# Patient Record
Sex: Male | Born: 1935 | Race: White | Hispanic: No | Marital: Married | Smoking: Never smoker
Health system: Southern US, Community
[De-identification: ages and names within clinical notes are randomized; demographics above are authoritative.]

## PROBLEM LIST (undated history)

## (undated) DIAGNOSIS — N200 Calculus of kidney: Secondary | ICD-10-CM

## (undated) DIAGNOSIS — E119 Type 2 diabetes mellitus without complications: Secondary | ICD-10-CM

## (undated) DIAGNOSIS — Q602 Renal agenesis, unspecified: Secondary | ICD-10-CM

## (undated) DIAGNOSIS — E785 Hyperlipidemia, unspecified: Secondary | ICD-10-CM

## (undated) DIAGNOSIS — D693 Immune thrombocytopenic purpura: Secondary | ICD-10-CM

## (undated) DIAGNOSIS — E039 Hypothyroidism, unspecified: Secondary | ICD-10-CM

## (undated) DIAGNOSIS — N390 Urinary tract infection, site not specified: Secondary | ICD-10-CM

## (undated) DIAGNOSIS — M1990 Unspecified osteoarthritis, unspecified site: Secondary | ICD-10-CM

## (undated) DIAGNOSIS — Z992 Dependence on renal dialysis: Secondary | ICD-10-CM

## (undated) DIAGNOSIS — N135 Crossing vessel and stricture of ureter without hydronephrosis: Secondary | ICD-10-CM

## (undated) DIAGNOSIS — I10 Essential (primary) hypertension: Secondary | ICD-10-CM

## (undated) HISTORY — DX: Renal agenesis, unspecified: Q60.2

## (undated) HISTORY — DX: Hyperlipidemia, unspecified: E78.5

## (undated) HISTORY — DX: Type 2 diabetes mellitus without complications: E11.9

## (undated) HISTORY — DX: Essential (primary) hypertension: I10

## (undated) HISTORY — PX: APPENDECTOMY: SHX54

## (undated) HISTORY — PX: KIDNEY STONE SURGERY: SHX686

## (undated) HISTORY — DX: Hypothyroidism, unspecified: E03.9

---

## 1983-06-17 HISTORY — PX: NASAL SINUS SURGERY: SHX719

## 2000-06-16 HISTORY — PX: FINGER SURGERY: SHX640

## 2004-07-30 ENCOUNTER — Ambulatory Visit: Payer: Self-pay | Admitting: Specialist

## 2006-06-15 ENCOUNTER — Ambulatory Visit: Payer: Self-pay | Admitting: Gastroenterology

## 2006-12-16 ENCOUNTER — Ambulatory Visit: Payer: Self-pay | Admitting: Internal Medicine

## 2006-12-28 ENCOUNTER — Ambulatory Visit: Payer: Self-pay | Admitting: Internal Medicine

## 2010-03-07 ENCOUNTER — Ambulatory Visit: Payer: Self-pay | Admitting: Gastroenterology

## 2010-03-11 LAB — PATHOLOGY REPORT

## 2010-12-13 ENCOUNTER — Ambulatory Visit: Payer: Self-pay | Admitting: General Surgery

## 2011-01-14 ENCOUNTER — Ambulatory Visit: Payer: Self-pay | Admitting: Specialist

## 2011-03-11 ENCOUNTER — Ambulatory Visit: Payer: Self-pay | Admitting: Urology

## 2011-03-31 DIAGNOSIS — I10 Essential (primary) hypertension: Secondary | ICD-10-CM | POA: Insufficient documentation

## 2011-03-31 DIAGNOSIS — E039 Hypothyroidism, unspecified: Secondary | ICD-10-CM | POA: Insufficient documentation

## 2011-03-31 DIAGNOSIS — N184 Chronic kidney disease, stage 4 (severe): Secondary | ICD-10-CM | POA: Insufficient documentation

## 2011-05-21 ENCOUNTER — Ambulatory Visit: Payer: Self-pay | Admitting: Urology

## 2011-05-28 ENCOUNTER — Ambulatory Visit: Payer: Self-pay | Admitting: Urology

## 2012-03-12 DIAGNOSIS — R351 Nocturia: Secondary | ICD-10-CM | POA: Insufficient documentation

## 2012-03-12 DIAGNOSIS — Q602 Renal agenesis, unspecified: Secondary | ICD-10-CM | POA: Insufficient documentation

## 2012-06-16 HISTORY — PX: CATARACT EXTRACTION W/ INTRAOCULAR LENS IMPLANT: SHX1309

## 2012-10-15 ENCOUNTER — Emergency Department: Payer: Self-pay | Admitting: Emergency Medicine

## 2012-10-15 LAB — COMPREHENSIVE METABOLIC PANEL
Albumin: 4.2 g/dL (ref 3.4–5.0)
Anion Gap: 11 (ref 7–16)
BUN: 51 mg/dL — ABNORMAL HIGH (ref 7–18)
Bilirubin,Total: 0.8 mg/dL (ref 0.2–1.0)
Calcium, Total: 8.7 mg/dL (ref 8.5–10.1)
EGFR (Non-African Amer.): 17 — ABNORMAL LOW
Osmolality: 290 (ref 275–301)
Potassium: 4.2 mmol/L (ref 3.5–5.1)
SGPT (ALT): 46 U/L (ref 12–78)
Sodium: 137 mmol/L (ref 136–145)
Total Protein: 8.7 g/dL — ABNORMAL HIGH (ref 6.4–8.2)

## 2012-10-15 LAB — CBC
HCT: 39.8 % — ABNORMAL LOW (ref 40.0–52.0)
MCHC: 34.5 g/dL (ref 32.0–36.0)
MCV: 95 fL (ref 80–100)
Platelet: 179 10*3/uL (ref 150–440)
RBC: 4.19 10*6/uL — ABNORMAL LOW (ref 4.40–5.90)
RDW: 14.2 % (ref 11.5–14.5)

## 2012-10-15 LAB — URINALYSIS, COMPLETE
Glucose,UR: NEGATIVE mg/dL (ref 0–75)
Ketone: NEGATIVE
Specific Gravity: 1.014 (ref 1.003–1.030)
Squamous Epithelial: 1

## 2012-11-06 LAB — URINE CULTURE

## 2013-02-21 DIAGNOSIS — R3915 Urgency of urination: Secondary | ICD-10-CM | POA: Insufficient documentation

## 2014-04-28 DIAGNOSIS — A498 Other bacterial infections of unspecified site: Secondary | ICD-10-CM | POA: Insufficient documentation

## 2014-04-28 DIAGNOSIS — Q600 Renal agenesis, unilateral: Secondary | ICD-10-CM | POA: Insufficient documentation

## 2015-04-24 ENCOUNTER — Other Ambulatory Visit: Payer: Self-pay | Admitting: Internal Medicine

## 2015-04-24 ENCOUNTER — Ambulatory Visit
Admission: RE | Admit: 2015-04-24 | Discharge: 2015-04-24 | Disposition: A | Discharge status: Home / Self Care | Payer: Medicare HMO | Source: Ambulatory Visit | Attending: Internal Medicine | Admitting: Internal Medicine

## 2015-04-24 DIAGNOSIS — M79606 Pain in leg, unspecified: Secondary | ICD-10-CM

## 2015-04-24 DIAGNOSIS — M8580 Other specified disorders of bone density and structure, unspecified site: Secondary | ICD-10-CM | POA: Insufficient documentation

## 2015-04-24 DIAGNOSIS — M47814 Spondylosis without myelopathy or radiculopathy, thoracic region: Secondary | ICD-10-CM | POA: Diagnosis not present

## 2015-04-24 DIAGNOSIS — I700 Atherosclerosis of aorta: Secondary | ICD-10-CM | POA: Insufficient documentation

## 2015-04-24 DIAGNOSIS — M5134 Other intervertebral disc degeneration, thoracic region: Secondary | ICD-10-CM | POA: Diagnosis not present

## 2015-07-24 ENCOUNTER — Inpatient Hospital Stay
Admission: AD | Admit: 2015-07-24 | Discharge: 2015-07-29 | DRG: 813 | Disposition: A | Discharge status: Home / Self Care | Payer: Medicare HMO | Source: Ambulatory Visit | Attending: Internal Medicine | Admitting: Internal Medicine

## 2015-07-24 ENCOUNTER — Encounter: Payer: Self-pay | Admitting: Oncology

## 2015-07-24 ENCOUNTER — Inpatient Hospital Stay: Payer: Medicare HMO | Attending: Oncology | Admitting: Oncology

## 2015-07-24 VITALS — BP 142/76 | HR 68 | Temp 97.1°F | Resp 16 | Wt 181.0 lb

## 2015-07-24 DIAGNOSIS — E538 Deficiency of other specified B group vitamins: Secondary | ICD-10-CM | POA: Insufficient documentation

## 2015-07-24 DIAGNOSIS — Z794 Long term (current) use of insulin: Secondary | ICD-10-CM

## 2015-07-24 DIAGNOSIS — R5383 Other fatigue: Secondary | ICD-10-CM | POA: Insufficient documentation

## 2015-07-24 DIAGNOSIS — D638 Anemia in other chronic diseases classified elsewhere: Secondary | ICD-10-CM | POA: Diagnosis present

## 2015-07-24 DIAGNOSIS — Z79899 Other long term (current) drug therapy: Secondary | ICD-10-CM

## 2015-07-24 DIAGNOSIS — D649 Anemia, unspecified: Secondary | ICD-10-CM

## 2015-07-24 DIAGNOSIS — E1122 Type 2 diabetes mellitus with diabetic chronic kidney disease: Secondary | ICD-10-CM | POA: Insufficient documentation

## 2015-07-24 DIAGNOSIS — Q602 Renal agenesis, unspecified: Secondary | ICD-10-CM

## 2015-07-24 DIAGNOSIS — E039 Hypothyroidism, unspecified: Secondary | ICD-10-CM | POA: Diagnosis present

## 2015-07-24 DIAGNOSIS — I517 Cardiomegaly: Secondary | ICD-10-CM | POA: Insufficient documentation

## 2015-07-24 DIAGNOSIS — I129 Hypertensive chronic kidney disease with stage 1 through stage 4 chronic kidney disease, or unspecified chronic kidney disease: Secondary | ICD-10-CM

## 2015-07-24 DIAGNOSIS — Z7952 Long term (current) use of systemic steroids: Secondary | ICD-10-CM | POA: Insufficient documentation

## 2015-07-24 DIAGNOSIS — E1165 Type 2 diabetes mellitus with hyperglycemia: Secondary | ICD-10-CM | POA: Diagnosis present

## 2015-07-24 DIAGNOSIS — D693 Immune thrombocytopenic purpura: Secondary | ICD-10-CM | POA: Insufficient documentation

## 2015-07-24 DIAGNOSIS — T380X5A Adverse effect of glucocorticoids and synthetic analogues, initial encounter: Secondary | ICD-10-CM | POA: Diagnosis not present

## 2015-07-24 DIAGNOSIS — N189 Chronic kidney disease, unspecified: Secondary | ICD-10-CM | POA: Diagnosis not present

## 2015-07-24 DIAGNOSIS — E785 Hyperlipidemia, unspecified: Secondary | ICD-10-CM | POA: Insufficient documentation

## 2015-07-24 DIAGNOSIS — M549 Dorsalgia, unspecified: Secondary | ICD-10-CM | POA: Diagnosis present

## 2015-07-24 DIAGNOSIS — Z87440 Personal history of urinary (tract) infections: Secondary | ICD-10-CM | POA: Insufficient documentation

## 2015-07-24 DIAGNOSIS — D696 Thrombocytopenia, unspecified: Secondary | ICD-10-CM | POA: Diagnosis present

## 2015-07-24 DIAGNOSIS — I2510 Atherosclerotic heart disease of native coronary artery without angina pectoris: Secondary | ICD-10-CM | POA: Insufficient documentation

## 2015-07-24 DIAGNOSIS — N184 Chronic kidney disease, stage 4 (severe): Secondary | ICD-10-CM | POA: Diagnosis present

## 2015-07-24 DIAGNOSIS — J9811 Atelectasis: Secondary | ICD-10-CM | POA: Insufficient documentation

## 2015-07-24 DIAGNOSIS — N281 Cyst of kidney, acquired: Secondary | ICD-10-CM | POA: Insufficient documentation

## 2015-07-24 DIAGNOSIS — K449 Diaphragmatic hernia without obstruction or gangrene: Secondary | ICD-10-CM | POA: Insufficient documentation

## 2015-07-24 DIAGNOSIS — R531 Weakness: Secondary | ICD-10-CM | POA: Insufficient documentation

## 2015-07-24 LAB — CBC
HEMATOCRIT: 31.1 % — AB (ref 40.0–52.0)
Hemoglobin: 10.8 g/dL — ABNORMAL LOW (ref 13.0–18.0)
MCH: 32.8 pg (ref 26.0–34.0)
MCHC: 34.6 g/dL (ref 32.0–36.0)
MCV: 95 fL (ref 80.0–100.0)
PLATELETS: 5 10*3/uL — AB (ref 150–440)
RBC: 3.27 MIL/uL — ABNORMAL LOW (ref 4.40–5.90)
RDW: 14.9 % — AB (ref 11.5–14.5)
WBC: 6.2 10*3/uL (ref 3.8–10.6)

## 2015-07-24 LAB — DAT, POLYSPECIFIC AHG (ARMC ONLY): POLYSPECIFIC AHG TEST: NEGATIVE

## 2015-07-24 LAB — FERRITIN: Ferritin: 219 ng/mL (ref 24–336)

## 2015-07-24 LAB — COMPREHENSIVE METABOLIC PANEL
ALK PHOS: 148 U/L — AB (ref 38–126)
ALT: 19 U/L (ref 17–63)
AST: 22 U/L (ref 15–41)
Albumin: 3.8 g/dL (ref 3.5–5.0)
Anion gap: 7 (ref 5–15)
BILIRUBIN TOTAL: 0.7 mg/dL (ref 0.3–1.2)
BUN: 47 mg/dL — AB (ref 6–20)
CALCIUM: 8.8 mg/dL — AB (ref 8.9–10.3)
CO2: 18 mmol/L — ABNORMAL LOW (ref 22–32)
Chloride: 110 mmol/L (ref 101–111)
Creatinine, Ser: 3.32 mg/dL — ABNORMAL HIGH (ref 0.61–1.24)
GFR calc Af Amer: 18 mL/min — ABNORMAL LOW (ref >60)
GFR, EST NON AFRICAN AMERICAN: 16 mL/min — AB (ref >60)
Glucose, Bld: 287 mg/dL — ABNORMAL HIGH (ref 65–99)
POTASSIUM: 4.9 mmol/L (ref 3.5–5.1)
Sodium: 135 mmol/L (ref 135–145)
TOTAL PROTEIN: 7.5 g/dL (ref 6.5–8.1)

## 2015-07-24 LAB — TYPE AND SCREEN
ABO/RH(D): O POS
ANTIBODY SCREEN: NEGATIVE

## 2015-07-24 LAB — APTT: APTT: 28 s (ref 24–36)

## 2015-07-24 LAB — IRON AND TIBC
Iron: 63 ug/dL (ref 45–182)
SATURATION RATIOS: 20 % (ref 17.9–39.5)
TIBC: 323 ug/dL (ref 250–450)
UIBC: 260 ug/dL

## 2015-07-24 LAB — PROTIME-INR
INR: 1.01
PROTHROMBIN TIME: 13.5 s (ref 11.4–15.0)

## 2015-07-24 LAB — FIBRINOGEN: FIBRINOGEN: 480 mg/dL — AB (ref 210–470)

## 2015-07-24 LAB — LACTATE DEHYDROGENASE: LDH: 249 U/L — ABNORMAL HIGH (ref 98–192)

## 2015-07-24 LAB — TSH: TSH: 4.375 u[IU]/mL (ref 0.350–4.500)

## 2015-07-24 LAB — FOLATE: Folate: 28 ng/mL (ref >5.9)

## 2015-07-24 LAB — FIBRIN DERIVATIVES D-DIMER (ARMC ONLY): FIBRIN DERIVATIVES D-DIMER (ARMC): 5760 — AB (ref 0–499)

## 2015-07-24 MED ORDER — ONDANSETRON HCL 4 MG PO TABS: 4.0000 mg | ORAL_TABLET | Freq: Four times a day (QID) | ORAL | Status: DC | PRN

## 2015-07-24 MED ORDER — FERROUS SULFATE 325 (65 FE) MG PO TABS
325.0000 mg | ORAL_TABLET | Freq: Every day | ORAL | Status: DC
  Administered 2015-07-26 – 2015-07-29 (×4): 325 mg via ORAL
  Filled 2015-07-24 (×4): qty 1

## 2015-07-24 MED ORDER — SODIUM BICARBONATE 650 MG PO TABS
1300.0000 mg | ORAL_TABLET | Freq: Two times a day (BID) | ORAL | Status: DC
  Administered 2015-07-24 – 2015-07-29 (×10): 1300 mg via ORAL
  Filled 2015-07-24 (×10): qty 2

## 2015-07-24 MED ORDER — NIACIN 500 MG PO TABS
500.0000 mg | ORAL_TABLET | Freq: Every day | ORAL | Status: DC
  Administered 2015-07-24 – 2015-07-28 (×5): 500 mg via ORAL
  Filled 2015-07-24 (×8): qty 1

## 2015-07-24 MED ORDER — LEVOTHYROXINE SODIUM 125 MCG PO TABS
125.0000 ug | ORAL_TABLET | Freq: Every day | ORAL | Status: DC
  Administered 2015-07-25 – 2015-07-29 (×5): 125 ug via ORAL
  Filled 2015-07-24 (×6): qty 1

## 2015-07-24 MED ORDER — ACETAMINOPHEN 650 MG RE SUPP: 650.0000 mg | Freq: Four times a day (QID) | RECTAL | Status: DC | PRN

## 2015-07-24 MED ORDER — VITAMIN D 1000 UNITS PO TABS
1000.0000 [IU] | ORAL_TABLET | Freq: Every day | ORAL | Status: DC
  Administered 2015-07-24 – 2015-07-29 (×5): 1000 [IU] via ORAL
  Filled 2015-07-24 (×5): qty 1

## 2015-07-24 MED ORDER — LISINOPRIL 20 MG PO TABS
20.0000 mg | ORAL_TABLET | Freq: Every day | ORAL | Status: DC
  Administered 2015-07-24 – 2015-07-29 (×5): 20 mg via ORAL
  Filled 2015-07-24 (×5): qty 1

## 2015-07-24 MED ORDER — AMLODIPINE BESYLATE 5 MG PO TABS
5.0000 mg | ORAL_TABLET | Freq: Every day | ORAL | Status: DC
  Administered 2015-07-24 – 2015-07-29 (×5): 5 mg via ORAL
  Filled 2015-07-24 (×5): qty 1

## 2015-07-24 MED ORDER — ACETAMINOPHEN 325 MG PO TABS: 650.0000 mg | ORAL_TABLET | Freq: Four times a day (QID) | ORAL | Status: DC | PRN

## 2015-07-24 MED ORDER — ATORVASTATIN CALCIUM 10 MG PO TABS
10.0000 mg | ORAL_TABLET | Freq: Every day | ORAL | Status: DC
  Administered 2015-07-24 – 2015-07-28 (×5): 10 mg via ORAL
  Filled 2015-07-24 (×5): qty 1

## 2015-07-24 MED ORDER — OXYCODONE HCL 5 MG PO TABS: 5.0000 mg | ORAL_TABLET | ORAL | Status: DC | PRN

## 2015-07-24 MED ORDER — OXYBUTYNIN CHLORIDE ER 5 MG PO TB24
5.0000 mg | ORAL_TABLET | Freq: Every day | ORAL | Status: DC
  Administered 2015-07-24 – 2015-07-28 (×5): 5 mg via ORAL
  Filled 2015-07-24 (×6): qty 1

## 2015-07-24 MED ORDER — SODIUM CHLORIDE 0.9 % IV SOLN
Freq: Once | INTRAVENOUS | Status: AC
  Administered 2015-07-24: 18:00:00 via INTRAVENOUS

## 2015-07-24 MED ORDER — MORPHINE SULFATE (PF) 2 MG/ML IV SOLN: 2.0000 mg | INTRAVENOUS | Status: DC | PRN

## 2015-07-24 MED ORDER — ONDANSETRON HCL 4 MG/2ML IJ SOLN: 4.0000 mg | Freq: Four times a day (QID) | INTRAMUSCULAR | Status: DC | PRN

## 2015-07-24 NOTE — Consult Note (Signed)
[place]  [phone] [phone]  ID: [name] OB: August 14, 1929  MR#: [id number]  DGL#:[id number]  Patient Care Team: Casilda Carls, MD as PCP - General (Internal Medicine)  CHIEF COMPLAINT: thrombocytopenia, easy bleeding or bruising.  INTERVAL HISTORY: Patient is an 82 year old male who was initially evaluated as a new patient consult in clinic and found to have a platelet count of 7. He complained of easy bleeding and bruising over the past 3-4 weeks. He is otherwise well and asymptomatic. Patient states prior to this he was in his usual state of health without any fevers or illnesses. He has back pain, but this is chronic in nature. He denies any weight loss. He has no night sweats. He has no neurologic complaints. He denies any chest pain, shortness of breath, cough, or hemoptysis. He denies any nausea, vomiting, cons patient, or diarrhea. He has no urinary complaints. Patient otherwise feels well and offers no further specific complaints.  REVIEW OF SYSTEMS:   Review of Systems  Constitutional: Negative for fever, weight loss, malaise/fatigue and diaphoresis.  Respiratory: Negative.  Negative for cough, hemoptysis and shortness of breath.   Cardiovascular: Negative.  Negative for chest pain.  Gastrointestinal: Negative.   Genitourinary: Negative.  Negative for hematuria.  Musculoskeletal: Positive for back pain.  Neurological: Negative.  Negative for weakness.  Endo/Heme/Allergies: Does not bruise/bleed easily.    As per HPI. Otherwise, a complete review of systems is negatve.  PAST MEDICAL HISTORY: Past Medical History  Diagnosis Date  . Hyperlipidemia   . Hypertension   . Hypothyroidism   . Type 2 diabetes mellitus ([place])   . Renal agenesis     discovered at age 68    PAST SURGICAL HISTORY: History reviewed. No pertinent past surgical history.  FAMILY HISTORY History reviewed. No pertinent family  history.     ADVANCED DIRECTIVES:    HEALTH MAINTENANCE: Social History  Substance Use Topics  . Smoking status: Never Smoker   . Smokeless tobacco: Never Used  . Alcohol Use: No     Colonoscopy:  PAP:  Bone density:  Lipid panel:  No Known Allergies  Current Facility-Administered Medications  Medication Dose Route Frequency Provider Last Rate Last Dose  . 0.9 %  sodium chloride infusion   Intravenous Once Lytle Butte, MD      . acetaminophen (TYLENOL) tablet 650 mg  650 mg Oral Q6H PRN Lytle Butte, MD       Or  . acetaminophen (TYLENOL) suppository 650 mg  650 mg Rectal Q6H PRN Lytle Butte, MD      . amLODipine (NORVASC) tablet 5 mg  5 mg Oral Daily Lytle Butte, MD   5 mg at 07/24/15 1412  . atorvastatin (LIPITOR) tablet 10 mg  10 mg Oral q1800 Lytle Butte, MD      . cholecalciferol (VITAMIN D) tablet 1,000 Units  1,000 Units Oral Daily Lytle Butte, MD   1,000 Units at 07/24/15 1412  . [START ON 07/25/2015] ferrous sulfate tablet 325 mg  325 mg Oral Q breakfast Lytle Butte, MD      . Derrill Memo ON 07/25/2015] levothyroxine (SYNTHROID, LEVOTHROID) tablet 125 mcg  125 mcg Oral QAC breakfast Lytle Butte, MD      . lisinopril (PRINIVIL,ZESTRIL) tablet 20 mg  20 mg Oral Daily Lytle Butte, MD   20 mg at 07/24/15 1412  . morphine 2 MG/ML injection 2 mg  2 mg Intravenous Q4H PRN Shanon Brow  K Hower, MD      . niacin tablet 500 mg  500 mg Oral QHS Lytle Butte, MD      . ondansetron [facility]) tablet 4 mg  4 mg Oral Q6H PRN Lytle Butte, MD       Or  . ondansetron [facility]) injection 4 mg  4 mg Intravenous Q6H PRN Lytle Butte, MD      . oxybutynin (DITROPAN-XL) 24 hr tablet 5 mg  5 mg Oral QHS Lytle Butte, MD      . oxyCODONE (Oxy IR/ROXICODONE) immediate release tablet 5 mg  5 mg Oral Q4H PRN Lytle Butte, MD      . sodium bicarbonate tablet 1,300 mg  1,300 mg Oral BID Lytle Butte, MD   1,300 mg at 07/24/15 1412    OBJECTIVE: Filed Vitals:   07/24/15 1222  BP: 158/82   Pulse: 76  Temp: 98.7 F (37.1 C)  Resp: 20     Body mass index is 27.68 kg/(m^2).    ECOG FS:0 - Asymptomatic  General: Well-developed, well-nourished, no acute distress. Eyes: Pink conjunctiva, anicteric sclera. HEENT: Normocephalic, moist mucous membranes, clear oropharnyx. Lungs: Clear to auscultation bilaterally. Heart: Regular rate and rhythm. No rubs, murmurs, or gallops. Abdomen: Soft, nontender, nondistended. No organomegaly noted, normoactive bowel sounds. Musculoskeletal: No edema, cyanosis, or clubbing. Neuro: Alert, answering all questions appropriately. Cranial nerves grossly intact. Skin: ecchymosis noted on all extremities. Psych: Normal affect. Lymphatics: No cervical, calvicular, axillary or inguinal LAD.   LAB RESULTS:  Lab Results  Component Value Date   NA 135 07/24/2015   K 4.9 07/24/2015   CL 110 07/24/2015   CO2 18* 07/24/2015   GLUCOSE 287* 07/24/2015   BUN 47* 07/24/2015   CREATININE 3.32* 07/24/2015   CALCIUM 8.8* 07/24/2015   PROT 7.5 07/24/2015   ALBUMIN 3.8 07/24/2015   AST 22 07/24/2015   ALT 19 07/24/2015   ALKPHOS 148* 07/24/2015   BILITOT 0.7 07/24/2015   GFRNONAA 16* 07/24/2015   GFRAA 18* 07/24/2015    Lab Results  Component Value Date   WBC 6.2 07/24/2015   HGB 10.8* 07/24/2015   HCT 31.1* 07/24/2015   MCV 95.0 07/24/2015   PLT 5* 07/24/2015     STUDIES: No results found.  ASSESSMENT: Severe thrombocytopenia with bleeding and bruising.  PLAN:    1. Thrombocytopenia: Unclear etiology. He denies any new medications or recent illnesses.  Patient had a normal platelet count in November 2016. He is otherwise asymptomatic. Patient has elevated creatinine, but but this is approximately his baseline. There is no suspicion of TTP. Have ordered laboratory work including DIC panel, iron stores, B 12, folate, and platelet antibodies. Will also schedule bone marrow biopsy for tomorrow morning to determine a definitive etiology.   Continue to transfuse platelets and maintain platelet count greater than 20,000. Recommend irradiated blood products until an etiology can be determined. 2. Chronic renal insufficiency: By report, Creatinine is approximately patient's baseline. Monitor. 3. Anemia: Bone marrow biopsy and labs as above. Will get haptoglobin, LDH, and Coombs to assess if there is any underlying hemolysis.  Appreciate consult, will follow.   [name], MD   07/24/2015 4:48 PM

## 2015-07-24 NOTE — Progress Notes (Signed)
   07/24/15 1330  Clinical Encounter Type  Visited With Patient and family together  Visit Type Initial;Spiritual support  Referral From Nurse  Consult/Referral To Chaplain  Spiritual Encounters  Spiritual Needs Prayer  Stress Factors  Patient Stress Factors Health changes  Family Stress Factors Major life changes  Met w/patient & family. Provided pastoral care & prayer. Chap. Jaicion Laurie G. Alba

## 2015-07-24 NOTE — H&P (Signed)
[place] [facility] [place] NAME: Alejandro Armstrong    MR#:  [id number]  DATE OF BIRTH:  1929-08-12   DATE OF ADMISSION:  07/24/2015  PRIMARY CARE PHYSICIAN: Casilda Carls, MD   REQUESTING/REFERRING PHYSICIAN: [facility]  CHIEF COMPLAINT:  Bruising  HISTORY OF PRESENT ILLNESS:  Alejandro Armstrong  is a 82 y.o. male with a known history of chronic kidney disease baseline creatinine around 3, thyroidism unspecified presenting as a direct admission to the hospital given thrombocytopenia. Patient states for prostate 3 weeks is been having unexplained bruising saw his PCP for similar symptoms had routine blood work drawn noted to have low platelet count referred to oncology. Patient also states about a week or 2 ago he had an injection in his eye and then noted to have corneal hemorrhage. Given platelet count of 7, active bruising, patient directed to hospital for admission and platelet transfusion.  PAST MEDICAL HISTORY:   Past Medical History  Diagnosis Date  . Hyperlipidemia   . Hypertension   . Hypothyroidism   . Type 2 diabetes mellitus ([place])   . Renal agenesis     discovered at age 35    PAST SURGICAL HISTORY:  History reviewed. No pertinent past surgical history.  SOCIAL HISTORY:   Social History  Substance Use Topics  . Smoking status: Never Smoker   . Smokeless tobacco: Never Used  . Alcohol Use: No    FAMILY HISTORY:  History reviewed. No pertinent family history.  DRUG ALLERGIES:  No Known Allergies  REVIEW OF SYSTEMS:  REVIEW OF SYSTEMS:  CONSTITUTIONAL: Denies fevers, chills, fatigue, weakness.  EYES: Denies blurred vision, double vision, or eye pain.  EARS, NOSE, THROAT: Denies tinnitus, ear pain, hearing loss.  RESPIRATORY: denies cough, shortness of breath, wheezing  CARDIOVASCULAR: Denies chest pain, palpitations, edema.  GASTROINTESTINAL: Denies nausea, vomiting, diarrhea, abdominal pain.   GENITOURINARY: Denies dysuria, hematuria.  ENDOCRINE: Denies nocturia or thyroid problems. HEMATOLOGIC AND LYMPHATIC: Positive easy bruising or bleeding.  SKIN: Denies rash or lesions. Positive dry itchy skin MUSCULOSKELETAL: Denies pain in neck, back, shoulder, knees, hips, or further arthritic symptoms.  NEUROLOGIC: Denies paralysis, paresthesias.  PSYCHIATRIC: Denies anxiety or depressive symptoms. Otherwise full review of systems performed by me is negative.   MEDICATIONS AT HOME:   Prior to Admission medications   Medication Sig Start Date End Date Taking? Authorizing Provider  amLODipine (NORVASC) 5 MG tablet Take 5 mg by mouth. 07/26/13   Historical Provider, MD  cholecalciferol (VITAMIN D) 1000 units tablet Take 1,000 Units by mouth daily.    Historical Provider, MD  ferrous sulfate 325 (65 FE) MG tablet Take 325 mg by mouth.    Historical Provider, MD  levothyroxine (SYNTHROID, LEVOTHROID) 125 MCG tablet  08/07/14   Historical Provider, MD  lisinopril (PRINIVIL,ZESTRIL) 20 MG tablet Take 20 mg by mouth. 06/24/12   Historical Provider, MD  niacin 500 MG tablet Take 500 mg by mouth. 06/24/12   Historical Provider, MD  Omega-3 1000 MG CAPS Take by mouth.    Historical Provider, MD  sodium bicarbonate 650 MG tablet  08/07/14   Historical Provider, MD      VITAL SIGNS:  Blood pressure 158/82, pulse 76, temperature 98.7 F (37.1 C), temperature source Oral, resp. rate 20, height 5\' 7"  (1.702 m), weight 176 lb 12.8 oz (80.196 kg), SpO2 100 %.  PHYSICAL EXAMINATION:  VITAL SIGNS: Filed Vitals:   07/24/15 1222  BP: 158/82  Pulse: 76  Temp: 98.7 F (37.1 C)  Resp: 20   GENERAL:82 y.o.male currently in no acute distress.  HEAD: Normocephalic, atraumatic.  EYES: Pupils equal, round, reactive to light. Extraocular muscles intact. No scleral icterus.  MOUTH: Moist mucosal membrane. Dentition intact. No abscess noted.  EAR, NOSE, THROAT: Clear without exudates. No external lesions.   NECK: Supple. No thyromegaly. No nodules. No JVD.  PULMONARY: Clear to ascultation, without wheeze rails or rhonci. No use of accessory muscles, Good respiratory effort. good air entry bilaterally CHEST: Nontender to palpation.  CARDIOVASCULAR: S1 and S2. Regular rate and rhythm. No murmurs, rubs, or gallops. No edema. Pedal pulses 2+ bilaterally.  GASTROINTESTINAL: Soft, nontender, nondistended. No masses. Positive bowel sounds. No hepatosplenomegaly.  MUSCULOSKELETAL: No swelling, clubbing, or edema. Range of motion full in all extremities.  NEUROLOGIC: Cranial nerves II through XII are intact. No gross focal neurological deficits. Sensation intact. Reflexes intact.  SKIN: Multiple areas of ecchymosis, upper extremity and lower extremity abdomen and groin, skin appears dry scaly Skin warm and dry. Turgor intact.  PSYCHIATRIC: Mood, affect within normal limits. The patient is awake, alert and oriented x 3. Insight, judgment intact.    LABORATORY PANEL:   CBC No results for input(s): WBC, HGB, HCT, PLT in the last 168 hours. ------------------------------------------------------------------------------------------------------------------  Chemistries  No results for input(s): NA, K, CL, CO2, GLUCOSE, BUN, CREATININE, CALCIUM, MG, AST, ALT, ALKPHOS, BILITOT in the last 168 hours.  Invalid input(s): GFRCGP ------------------------------------------------------------------------------------------------------------------  Cardiac Enzymes No results for input(s): TROPONINI in the last 168 hours. ------------------------------------------------------------------------------------------------------------------  RADIOLOGY:  No results found.  EKG:   Orders placed or performed in visit on 05/21/11  . EKG 12-Lead    IMPRESSION AND PLAN:   82 year old Caucasian gentleman history of essential hypertension, hypothyroidism unspecified presenting with thrombocytopenia  1. Thrombocytopenia  with bruising: Repeat labs, type cross, transfuse platelets and goal greater than 50, consult oncology for further workup avoid antiplatelet, anticoagulant 2. Essential hypertension: Norvasc, lisinopril 3. Hypothyroidism unspecified: Synthroid, check TSH 4. Chronic kidney disease baseline creatinine around 3 continue sodium bicarbonate 5. Venous thromboembolic embolism prophylactic: SCDs given thrombocytopenia    All the records are reviewed and case discussed with ED provider. Management plans discussed with the patient, family and they are in agreement.  CODE STATUS: Full  TOTAL TIME TAKING CARE OF THIS PATIENT: 40 minutes.    Exilda Wilhite,  Karenann Cai.D on 07/24/2015 at 1:09 PM  Between 7am to 6pm - Pager - [phone]  After 6pm: House Pager: - [place]  Office  [phone]  CC: Primary care physician; Casilda Carls, MD

## 2015-07-24 NOTE — Progress Notes (Signed)
PCP would like patient to have evaluation for easy bruising, bleeding, frequent nose bleed that started 2-3 weeks ago.  Went to eye doctor yesterday and diagnosed with hemorrhage in left retina.

## 2015-07-25 ENCOUNTER — Inpatient Hospital Stay: Payer: Medicare HMO

## 2015-07-25 ENCOUNTER — Other Ambulatory Visit: Payer: Self-pay | Admitting: *Deleted

## 2015-07-25 ENCOUNTER — Encounter: Payer: Self-pay | Admitting: Radiology

## 2015-07-25 LAB — CBC WITH DIFFERENTIAL/PLATELET
Basophils Absolute: 0.1 10*3/uL (ref 0–0.1)
Basophils Relative: 1 %
Eosinophils Absolute: 0.7 10*3/uL (ref 0–0.7)
HEMATOCRIT: 27.4 % — AB (ref 40.0–52.0)
HEMOGLOBIN: 9.5 g/dL — AB (ref 13.0–18.0)
Lymphocytes Relative: 23 %
Lymphs Abs: 1.6 10*3/uL (ref 1.0–3.6)
MCH: 32.5 pg (ref 26.0–34.0)
MCHC: 34.7 g/dL (ref 32.0–36.0)
MCV: 93.6 fL (ref 80.0–100.0)
Monocytes Absolute: 0.6 10*3/uL (ref 0.2–1.0)
Neutro Abs: 4 10*3/uL (ref 1.4–6.5)
Platelets: 37 10*3/uL — ABNORMAL LOW (ref 150–440)
RBC: 2.93 MIL/uL — AB (ref 4.40–5.90)
RDW: 14.4 % (ref 11.5–14.5)
WBC: 6.9 10*3/uL (ref 3.8–10.6)

## 2015-07-25 LAB — GLUCOSE, CAPILLARY
Glucose-Capillary: 160 mg/dL — ABNORMAL HIGH (ref 65–99)
Glucose-Capillary: 250 mg/dL — ABNORMAL HIGH (ref 65–99)
Glucose-Capillary: 137 mg/dL — ABNORMAL HIGH (ref 65–99)

## 2015-07-25 LAB — VITAMIN B12: VITAMIN B 12: 111 pg/mL — AB (ref 180–914)

## 2015-07-25 LAB — PREPARE PLATELET PHERESIS
UNIT DIVISION: 0
Unit division: 0

## 2015-07-25 LAB — ABO/RH: ABO/RH(D): O POS

## 2015-07-25 LAB — HAPTOGLOBIN: Haptoglobin: 63 mg/dL (ref 34–200)

## 2015-07-25 LAB — FIBRIN DEGRADATION PROD.(ARMC ONLY)

## 2015-07-25 MED ORDER — AMLODIPINE BESYLATE 5 MG PO TABS
5.0000 mg | ORAL_TABLET | Freq: Once | ORAL | Status: AC
  Administered 2015-07-25: 15:00:00 5 mg via ORAL
  Filled 2015-07-25: qty 1

## 2015-07-25 MED ORDER — MIDAZOLAM HCL 5 MG/5ML IJ SOLN
INTRAMUSCULAR | Status: AC
  Filled 2015-07-25: qty 5

## 2015-07-25 MED ORDER — INSULIN ASPART 100 UNIT/ML ~~LOC~~ SOLN
0.0000 [IU] | Freq: Every day | SUBCUTANEOUS | Status: DC
  Administered 2015-07-26: 2 [IU] via SUBCUTANEOUS
  Administered 2015-07-27: 4 [IU] via SUBCUTANEOUS
  Filled 2015-07-25: qty 4
  Filled 2015-07-25: qty 2

## 2015-07-25 MED ORDER — SODIUM CHLORIDE 0.9 % IV SOLN
INTRAVENOUS | Status: AC | PRN
  Administered 2015-07-25: 10 mL/h via INTRAVENOUS

## 2015-07-25 MED ORDER — LORAZEPAM 2 MG/ML IJ SOLN
INTRAMUSCULAR | Status: AC | PRN
  Administered 2015-07-25: 14:00:00 1 mg via INTRAVENOUS

## 2015-07-25 MED ORDER — FENTANYL CITRATE (PF) 100 MCG/2ML IJ SOLN
INTRAMUSCULAR | Status: AC
  Filled 2015-07-25: qty 4

## 2015-07-25 MED ORDER — INSULIN ASPART 100 UNIT/ML ~~LOC~~ SOLN
0.0000 [IU] | Freq: Three times a day (TID) | SUBCUTANEOUS | Status: DC
  Administered 2015-07-25: 3 [IU] via SUBCUTANEOUS
  Administered 2015-07-26: 2 [IU] via SUBCUTANEOUS
  Administered 2015-07-26: 17:00:00 1 [IU] via SUBCUTANEOUS
  Administered 2015-07-27: 3 [IU] via SUBCUTANEOUS
  Administered 2015-07-27: 17:00:00 7 [IU] via SUBCUTANEOUS
  Administered 2015-07-28: 08:00:00 2 [IU] via SUBCUTANEOUS
  Administered 2015-07-28: 12:00:00 7 [IU] via SUBCUTANEOUS
  Filled 2015-07-25: qty 7
  Filled 2015-07-25: qty 3
  Filled 2015-07-25 (×2): qty 2
  Filled 2015-07-25: qty 3
  Filled 2015-07-25: qty 1
  Filled 2015-07-25: qty 7

## 2015-07-25 MED ORDER — HEPARIN SOD (PORK) LOCK FLUSH 100 UNIT/ML IV SOLN
INTRAVENOUS | Status: AC
  Filled 2015-07-25: qty 5

## 2015-07-25 NOTE — Progress Notes (Signed)
Patient's platelet count improved to 37,000 today with transfusion. The remainder of his laboratory work does not indicate a distinct etiology. Bone marrow biopsy completed today, but likely will not have results until early next week. If patient's platelet count remains stable tomorrow without transfusion, okay to discharge with follow-up in the [place] next week.  Will follow

## 2015-07-25 NOTE — Progress Notes (Signed)
[place] at [place] NAME: Alejandro Armstrong    MR#:  [id number]  DATE OF BIRTH:  [birth year]  SUBJECTIVE:  CHIEF COMPLAINT:  No chief complaint on file. No complaint.  REVIEW OF SYSTEMS:  CONSTITUTIONAL: No fever, fatigue or weakness.  EYES: No blurred or double vision.  EARS, NOSE, AND THROAT: No tinnitus or ear pain.  RESPIRATORY: No cough, shortness of breath, wheezing or hemoptysis.  CARDIOVASCULAR: No chest pain, orthopnea, edema.  GASTROINTESTINAL: No nausea, vomiting, diarrhea or abdominal pain.  GENITOURINARY: No dysuria, hematuria.  ENDOCRINE: No polyuria, nocturia,  HEMATOLOGY: No anemia, easy bruising or bleeding SKIN: No rash or lesion. MUSCULOSKELETAL: No joint pain or arthritis.   NEUROLOGIC: No tingling, numbness, weakness.  PSYCHIATRY: No anxiety or depression.   DRUG ALLERGIES:  No Known Allergies  VITALS:  Blood pressure 178/69, pulse 55, temperature 98.4 F (36.9 C), temperature source Oral, resp. rate 16, height '5\' 7"'$  (1.702 m), weight 80.196 kg (176 lb 12.8 oz), SpO2 100 %.  PHYSICAL EXAMINATION:  GENERAL:  82 y.o.-year-old patient lying in the bed with no acute distress.  EYES: Pupils equal, round, reactive to light and accommodation. No scleral icterus. Extraocular muscles intact.  HEENT: Head atraumatic, normocephalic. Oropharynx and nasopharynx clear.  NECK:  Supple, no jugular venous distention. No thyroid enlargement, no tenderness.  LUNGS: Normal breath sounds bilaterally, no wheezing, rales,rhonchi or crepitation. No use of accessory muscles of respiration.  CARDIOVASCULAR: S1, S2 normal. No murmurs, rubs, or gallops.  ABDOMEN: Soft, nontender, nondistended. Bowel sounds present. No organomegaly or mass.  EXTREMITIES: No pedal edema, cyanosis, or clubbing.  NEUROLOGIC: Cranial nerves II through XII are intact. Muscle strength 5/5 in all extremities. Sensation intact. Gait not checked.   PSYCHIATRIC: The patient is alert and oriented x 3.  SKIN: No obvious rash, lesion, or ulcer. Multiple areas of ecchymosis.   LABORATORY PANEL:   CBC  Recent Labs Lab 07/24/15 2316  WBC 6.9  HGB 9.5*  HCT 27.4*  PLT 37*   ------------------------------------------------------------------------------------------------------------------  Chemistries   Recent Labs Lab 07/24/15 1310  NA 135  K 4.9  CL 110  CO2 18*  GLUCOSE 287*  BUN 47*  CREATININE 3.32*  CALCIUM 8.8*  AST 22  ALT 19  ALKPHOS 148*  BILITOT 0.7   ------------------------------------------------------------------------------------------------------------------  Cardiac Enzymes No results for input(s): TROPONINI in the last 168 hours. ------------------------------------------------------------------------------------------------------------------  RADIOLOGY:  No results found.  EKG:   Orders placed or performed in visit on 05/21/11  . EKG 12-Lead    ASSESSMENT AND PLAN:   82 year old Caucasian gentleman history of essential hypertension, hypothyroidism unspecified presenting with thrombocytopenia  1. Thrombocytopenia with bruising: S/p transfusion of platelets, up to 37, bone marrow biopsy today. Continue to transfuse platelets and maintain platelet count greater than 20,000. Recommend irradiated blood products until an etiology can be determined per Dr. Grayland Ormond.  2. Essential hypertension: Norvasc, lisinopril 3. Hypothyroidism unspecified: Synthroid. 4. Chronic kidney disease stage 4.  baseline creatinine around 3, stable, continue sodium bicarbonate 5. Venous thromboembolic embolism prophylactic: SCDs given thrombocytopenia     All the records are reviewed and case discussed with Care Management/Social Workerr. Management plans discussed with the patient, his daughter and a sister and they are in agreement.  CODE STATUS: Full code  TOTAL TIME TAKING CARE OF THIS PATIENT: 38  minutes.  Greater than 50% time was spent on coordination of care and face-to-face counseling.  POSSIBLE D/C IN 2-3 DAYS, DEPENDING ON CLINICAL  CONDITION.   Demetrios Loll M.D on 07/25/2015 at 3:02 PM  Between 7am to 6pm - Pager - [phone]  After 6pm go to [email or web address] - password [email or web address] [facility]  Office  [phone]  CC: Primary care physician; Casilda Carls, MD

## 2015-07-25 NOTE — Progress Notes (Signed)
Spoke with Dr. Bridgett Larsson to make him aware of pt's current BP and get order for diet.  New orders given.  Also made Dr. Bridgett Larsson aware that pt's was in a junctional rhythm while in specials for biopsy.  New order for cardiac monitor given and placed on pt.  Clarise Cruz, RN

## 2015-07-25 NOTE — Progress Notes (Signed)
Inpatient Diabetes Program Recommendations  AACE/ADA: New Consensus Statement on Inpatient Glycemic Control (2015)  Target Ranges:  Prepandial:   less than 140 mg/dL      Peak postprandial:   less than 180 mg/dL (1-2 hours)      Critically ill patients:  140 - 180 mg/dL   Review of Glycemic Control:  Results for QUINNTEN, BITER (MRN [id number]) as of 07/25/2015 09:39  Ref. Range 07/24/2015 13:10  Glucose Latest Ref Range: 65-99 mg/dL 287 (H)    Diabetes history: Type 2 diabetes Outpatient Diabetes medications: None listed Inpatient Diabetes Program Recommendations:  Note history of diabetes.  Please consider adding Novolog correction sensitive tid with meals and HS while in the hospital.  Thanks, Adah Perl, RN, BC-ADM Inpatient Diabetes Coordinator Pager [phone] (8a-5p)

## 2015-07-25 NOTE — Procedures (Signed)
Interventional Radiology Procedure Note  Procedure: CT guided aspirate and core biopsy of left posterior iliac bone Complications: None Recommendations: - Bedrest supine x 3 hrs - OTC's for PRN  Pain - Follow biopsy results  Signed,  Dulcy Fanny. Earleen Newport, DO

## 2015-07-25 NOTE — Plan of Care (Signed)
Problem: Pain Managment: Goal: General experience of comfort will improve Outcome: Progressing No complaints of pain. Received 2 units of platelets this shift, no adverse reaction noted. Ambulates with assist.

## 2015-07-26 DIAGNOSIS — E538 Deficiency of other specified B group vitamins: Secondary | ICD-10-CM

## 2015-07-26 LAB — CBC
HCT: 28.7 % — ABNORMAL LOW (ref 40.0–52.0)
HEMOGLOBIN: 9.9 g/dL — AB (ref 13.0–18.0)
MCH: 32.5 pg (ref 26.0–34.0)
MCHC: 34.4 g/dL (ref 32.0–36.0)
MCV: 94.4 fL (ref 80.0–100.0)
Platelets: <5 10*3/uL — CL (ref 150–440)
RBC: 3.04 MIL/uL — AB (ref 4.40–5.90)
RDW: 14.8 % — ABNORMAL HIGH (ref 11.5–14.5)
WBC: 5.1 10*3/uL (ref 3.8–10.6)

## 2015-07-26 LAB — GLUCOSE, CAPILLARY
GLUCOSE-CAPILLARY: 110 mg/dL — AB (ref 65–99)
Glucose-Capillary: 176 mg/dL — ABNORMAL HIGH (ref 65–99)
Glucose-Capillary: 148 mg/dL — ABNORMAL HIGH (ref 65–99)
Glucose-Capillary: 209 mg/dL — ABNORMAL HIGH (ref 65–99)

## 2015-07-26 MED ORDER — DIPHENHYDRAMINE HCL 25 MG PO CAPS
25.0000 mg | ORAL_CAPSULE | Freq: Every evening | ORAL | Status: DC | PRN
  Administered 2015-07-26 – 2015-07-27 (×2): 25 mg via ORAL
  Filled 2015-07-26 (×2): qty 1

## 2015-07-26 MED ORDER — CYANOCOBALAMIN 1000 MCG/ML IJ SOLN
1000.0000 ug | Freq: Once | INTRAMUSCULAR | Status: AC
  Administered 2015-07-26: 19:00:00 1000 ug via INTRAMUSCULAR
  Filled 2015-07-26: qty 1

## 2015-07-26 MED ORDER — SODIUM CHLORIDE 0.9 % IV SOLN
Freq: Once | INTRAVENOUS | Status: AC
  Administered 2015-07-26: 17:00:00 via INTRAVENOUS

## 2015-07-26 MED ORDER — PREDNISONE 50 MG PO TABS
80.0000 mg | ORAL_TABLET | Freq: Every day | ORAL | Status: DC
  Administered 2015-07-26 – 2015-07-29 (×4): 80 mg via ORAL
  Filled 2015-07-26 (×4): qty 1

## 2015-07-26 NOTE — Progress Notes (Signed)
[place]  [phone] [phone]  ID: [name] OB: [birth year]  MR#: [id number]  DUK#:[id number]  Patient Care Team: Casilda Carls, MD as PCP - General (Internal Medicine)  CHIEF COMPLAINT: Thrombocytopenia.  INTERVAL HISTORY: Patient's platelet count initially improved to 37 yesterday, but now have decreased down to 4. He continues to feel well and remains asymptomatic. He does not complain of any further bleeding. Patient feels at his baseline and offers no specific complaints today.  REVIEW OF SYSTEMS:   Review of Systems  Constitutional: Negative.   Cardiovascular: Negative.   Genitourinary: Negative.   Musculoskeletal: Negative.   Neurological: Negative.   Endo/Heme/Allergies: Bruises/bleeds easily.    As per HPI. Otherwise, a complete review of systems is negatve.  PAST MEDICAL HISTORY: Past Medical History  Diagnosis Date  . Hyperlipidemia   . Hypertension   . Hypothyroidism   . Type 2 diabetes mellitus ([place])   . Renal agenesis     discovered at age 52    PAST SURGICAL HISTORY: History reviewed. No pertinent past surgical history.  FAMILY HISTORY History reviewed. No pertinent family history.     ADVANCED DIRECTIVES:    HEALTH MAINTENANCE: Social History  Substance Use Topics  . Smoking status: Never Smoker   . Smokeless tobacco: Never Used  . Alcohol Use: No     Colonoscopy:  PAP:  Bone density:  Lipid panel:  No Known Allergies  Current Facility-Administered Medications  Medication Dose Route Frequency Provider Last Rate Last Dose  . 0.9 %  sodium chloride infusion   Intravenous Once Demetrios Loll, MD      . acetaminophen (TYLENOL) tablet 650 mg  650 mg Oral Q6H PRN Lytle Butte, MD       Or  . acetaminophen (TYLENOL) suppository 650 mg  650 mg Rectal Q6H PRN Lytle Butte, MD      . amLODipine (NORVASC) tablet 5 mg  5 mg Oral Daily Lytle Butte, MD   5 mg at 07/26/15 1013  . atorvastatin  (LIPITOR) tablet 10 mg  10 mg Oral q1800 Lytle Butte, MD   10 mg at 07/25/15 1736  . cholecalciferol (VITAMIN D) tablet 1,000 Units  1,000 Units Oral Daily Lytle Butte, MD   1,000 Units at 07/26/15 1013  . ferrous sulfate tablet 325 mg  325 mg Oral Q breakfast Lytle Butte, MD   325 mg at 07/26/15 1019  . insulin aspart (novoLOG) injection 0-5 Units  0-5 Units Subcutaneous QHS Demetrios Loll, MD   0 Units at 07/25/15 2210  . insulin aspart (novoLOG) injection 0-9 Units  0-9 Units Subcutaneous TID WC Demetrios Loll, MD   2 Units at 07/26/15 1210  . levothyroxine (SYNTHROID, LEVOTHROID) tablet 125 mcg  125 mcg Oral QAC breakfast Lytle Butte, MD   125 mcg at 07/26/15 0506  . lisinopril (PRINIVIL,ZESTRIL) tablet 20 mg  20 mg Oral Daily Lytle Butte, MD   20 mg at 07/26/15 1013  . morphine 2 MG/ML injection 2 mg  2 mg Intravenous Q4H PRN Lytle Butte, MD      . niacin tablet 500 mg  500 mg Oral QHS Lytle Butte, MD   500 mg at 07/25/15 2211  . ondansetron (ZOFRAN) tablet 4 mg  4 mg Oral Q6H PRN Lytle Butte, MD       Or  . ondansetron [facility]) injection 4 mg  4 mg Intravenous Q6H PRN Lytle Butte, MD      .  oxybutynin (DITROPAN-XL) 24 hr tablet 5 mg  5 mg Oral QHS Lytle Butte, MD   5 mg at 07/25/15 2210  . oxyCODONE (Oxy IR/ROXICODONE) immediate release tablet 5 mg  5 mg Oral Q4H PRN Lytle Butte, MD      . predniSONE (DELTASONE) tablet 80 mg  80 mg Oral Daily [name], MD      . sodium bicarbonate tablet 1,300 mg  1,300 mg Oral BID Lytle Butte, MD   1,300 mg at 07/26/15 1013    OBJECTIVE: Filed Vitals:   07/26/15 1530 07/26/15 1606  BP: 136/82 134/74  Pulse: 64 68  Temp: 98.6 F (37 C) 98.7 F (37.1 C)  Resp: 18 18     Body mass index is 27.68 kg/(m^2).    ECOG FS:0 - Asymptomatic  General: Well-developed, well-nourished, no acute distress. Eyes: Pink conjunctiva, anicteric sclera. Lungs: Clear to auscultation bilaterally. Heart: Regular rate and rhythm. No rubs, murmurs,  or gallops. Abdomen: Soft, nontender, nondistended. No organomegaly noted, normoactive bowel sounds. Musculoskeletal: No edema, cyanosis, or clubbing. Neuro: Alert, answering all questions appropriately. Cranial nerves grossly intact. Skin: No rashes. Multiple ecchymosis noted. Psych: Normal affect.    LAB RESULTS:  Lab Results  Component Value Date   NA 135 07/24/2015   K 4.9 07/24/2015   CL 110 07/24/2015   CO2 18* 07/24/2015   GLUCOSE 287* 07/24/2015   BUN 47* 07/24/2015   CREATININE 3.32* 07/24/2015   CALCIUM 8.8* 07/24/2015   PROT 7.5 07/24/2015   ALBUMIN 3.8 07/24/2015   AST 22 07/24/2015   ALT 19 07/24/2015   ALKPHOS 148* 07/24/2015   BILITOT 0.7 07/24/2015   GFRNONAA 16* 07/24/2015   GFRAA 18* 07/24/2015    Lab Results  Component Value Date   WBC 5.1 07/26/2015   NEUTROABS 4.0 07/24/2015   HGB 9.9* 07/26/2015   HCT 28.7* 07/26/2015   MCV 94.4 07/26/2015   PLT <5* 07/26/2015     STUDIES: Ct Biopsy  07/25/2015  CLINICAL DATA:  82 year old male with thrombocytopenia EXAM: CT-GUIDED BIOPSY BONE MARROW MEDICATIONS AND MEDICAL HISTORY: Versed 1.0 mg, Fentanyl 0 mcg. Additional Medications: None. ANESTHESIA/SEDATION: None PROCEDURE: The procedure risks, benefits, and alternatives were explained to the patient. Questions regarding the procedure were encouraged and answered. The patient understands and consents to the procedure. Scout CT of the pelvis was performed for surgical planning purposes. The posterior pelvis was prepped with Betadinein a sterile fashion, and a sterile drape was applied covering the operative field. A sterile gown and sterile gloves were used for the procedure. Local anesthesia was provided with 1% Lidocaine. We targeted the left posterior iliac bone for biopsy. The skin and subcutaneous tissues were infiltrated with 1% lidocaine without epinephrine. A small stab incision was made with an 11 blade scalpel, and an 11 gauge Murphy needle was advanced  with CT guidance to the posterior cortex. Manual forced was used to advance the needle through the posterior cortex and the stylet was removed. A bone marrow aspirate was retrieved and passed to a cytotechnologist in the room. The Murphy needle was then advanced without the stylet for a core biopsy. The core biopsy was retrieved and also passed to a cytotechnologist. Manual pressure was used for hemostasis and a sterile dressing was placed. No complications were encountered no significant blood loss was encountered. Patient tolerated the procedure well and remained hemodynamically stable throughout. FINDINGS: Scout image demonstrates safe approach to posterior iliac bone. Images during the case demonstrate placement  of 11 gauge Murphy needle COMPLICATIONS: None IMPRESSION: Status post CT-guided bone marrow biopsy, with tissue specimen sent to pathology for complete histopathologic analysis Signed, Dulcy Fanny. Earleen Newport, DO [facility] Radiology Electronically Signed   By: Corrie Mckusick D.O.   On: 07/25/2015 17:08    ASSESSMENT: Severe thrombocytopenia with bleeding and bruising.  PLAN:    1. Thrombocytopenia: Unclear etiology. Laboratory work unrevealing. Bone marrow biopsy completed yesterday is pending. Will proceed with 80 mg of prednisone daily or 1 mg/kg. EBV, CMV, and parvo B19 has been ordered and are pending. Continue to transfuse with irradiated platelets maintaining count greater than 20,000.  Patient has elevated creatinine, but but this is approximately his baseline. There is no suspicion of TTP.  2. Chronic renal insufficiency: By report, Creatinine is approximately patient's baseline. Monitor. 3. Anemia: Bone marrow biopsy and labs as above. No evidence of hemolysis.   4. B-12 deficiency: Patient's B-12 levels noted to be low therefore proceed with 1000 g intramuscular B 12.  Will follow.   [name], MD   07/26/2015 4:36 PM

## 2015-07-26 NOTE — Care Management Important Message (Signed)
Important Message  Patient Details  Name: Alejandro Armstrong MRN: [id number] Date of Birth: [birth year]   Medicare Important Message Given:  Yes    [name] 07/26/2015, 10:02 AM

## 2015-07-26 NOTE — Plan of Care (Signed)
RN called to confirm with Dr. Cecille Aver that he DOES want pt to receive irradiated platelets.  Blood [facility]) contacted to inform.

## 2015-07-26 NOTE — Plan of Care (Signed)
Dr. Curly Rim of critical platelets of 4. No further orders at this time.

## 2015-07-26 NOTE — Progress Notes (Signed)
[place] at [place] NAME: Torence Palmeri    MR#:  [id number]  DATE OF BIRTH:  [birth year]  SUBJECTIVE:  CHIEF COMPLAINT:  No chief complaint on file. No complaint.  REVIEW OF SYSTEMS:  CONSTITUTIONAL: No fever, fatigue or weakness.  EYES: No blurred or double vision.  EARS, NOSE, AND THROAT: No tinnitus or ear pain.  RESPIRATORY: No cough, shortness of breath, wheezing or hemoptysis.  CARDIOVASCULAR: No chest pain, orthopnea, edema.  GASTROINTESTINAL: No nausea, vomiting, diarrhea or abdominal pain. No melena or bloody stool. GENITOURINARY: No dysuria, hematuria.  ENDOCRINE: No polyuria, nocturia,  HEMATOLOGY: No anemia, easy bruising or bleeding SKIN: No rash or lesion. No new bruises. MUSCULOSKELETAL: No joint pain or arthritis.   NEUROLOGIC: No tingling, numbness, weakness.  PSYCHIATRY: No anxiety or depression.   DRUG ALLERGIES:  No Known Allergies  VITALS:  Blood pressure 150/70, pulse 62, temperature 97.7 F (36.5 C), temperature source Oral, resp. rate 17, height '5\' 7"'  (1.702 m), weight 80.196 kg (176 lb 12.8 oz), SpO2 98 %.  PHYSICAL EXAMINATION:  GENERAL:  82 y.o.-year-old patient lying in the bed with no acute distress.  EYES: Pupils equal, round, reactive to light and accommodation. No scleral icterus. Extraocular muscles intact.  HEENT: Head atraumatic, normocephalic. Oropharynx and nasopharynx clear.  NECK:  Supple, no jugular venous distention. No thyroid enlargement, no tenderness.  LUNGS: Normal breath sounds bilaterally, no wheezing, rales,rhonchi or crepitation. No use of accessory muscles of respiration.  CARDIOVASCULAR: S1, S2 normal. No murmurs, rubs, or gallops.  ABDOMEN: Soft, nontender, nondistended. Bowel sounds present. No organomegaly or mass.  EXTREMITIES: No pedal edema, cyanosis, or clubbing.  NEUROLOGIC: Cranial nerves II through XII are intact. Muscle strength 5/5 in all extremities.  Sensation intact. Gait not checked.  PSYCHIATRIC: The patient is alert and oriented x 3.  SKIN: No obvious rash, lesion, or ulcer. Multiple areas of ecchymosis.   LABORATORY PANEL:   CBC  Recent Labs Lab 07/26/15 0857  WBC 5.1  HGB 9.9*  HCT 28.7*  PLT <5*   ------------------------------------------------------------------------------------------------------------------  Chemistries   Recent Labs Lab 07/24/15 1310  NA 135  K 4.9  CL 110  CO2 18*  GLUCOSE 287*  BUN 47*  CREATININE 3.32*  CALCIUM 8.8*  AST 22  ALT 19  ALKPHOS 148*  BILITOT 0.7   ------------------------------------------------------------------------------------------------------------------  Cardiac Enzymes No results for input(s): TROPONINI in the last 168 hours. ------------------------------------------------------------------------------------------------------------------  RADIOLOGY:  Ct Biopsy  07/25/2015  CLINICAL DATA:  82 year old male with thrombocytopenia EXAM: CT-GUIDED BIOPSY BONE MARROW MEDICATIONS AND MEDICAL HISTORY: Versed 1.0 mg, Fentanyl 0 mcg. Additional Medications: None. ANESTHESIA/SEDATION: None PROCEDURE: The procedure risks, benefits, and alternatives were explained to the patient. Questions regarding the procedure were encouraged and answered. The patient understands and consents to the procedure. Scout CT of the pelvis was performed for surgical planning purposes. The posterior pelvis was prepped with Betadinein a sterile fashion, and a sterile drape was applied covering the operative field. A sterile gown and sterile gloves were used for the procedure. Local anesthesia was provided with 1% Lidocaine. We targeted the left posterior iliac bone for biopsy. The skin and subcutaneous tissues were infiltrated with 1% lidocaine without epinephrine. A small stab incision was made with an 11 blade scalpel, and an 11 gauge Murphy needle was advanced with CT guidance to the posterior  cortex. Manual forced was used to advance the needle through the posterior cortex and the stylet was removed. A bone  marrow aspirate was retrieved and passed to a cytotechnologist in the room. The Murphy needle was then advanced without the stylet for a core biopsy. The core biopsy was retrieved and also passed to a cytotechnologist. Manual pressure was used for hemostasis and a sterile dressing was placed. No complications were encountered no significant blood loss was encountered. Patient tolerated the procedure well and remained hemodynamically stable throughout. FINDINGS: Scout image demonstrates safe approach to posterior iliac bone. Images during the case demonstrate placement of 11 gauge Murphy needle COMPLICATIONS: None IMPRESSION: Status post CT-guided bone marrow biopsy, with tissue specimen sent to pathology for complete histopathologic analysis Signed, Dulcy Fanny. Earleen Newport, DO [facility] Radiology Electronically Signed   By: Corrie Mckusick D.O.   On: 07/25/2015 17:08    EKG:   Orders placed or performed in visit on 05/21/11  . EKG 12-Lead    ASSESSMENT AND PLAN:   82 year old Caucasian gentleman history of essential hypertension, hypothyroidism unspecified presenting with thrombocytopenia  1. Thrombocytopenia with bruising: S/p transfusion of platelets, up to 37, but down to <5 today, s/p bone marrow biopsy. Continue to transfuse platelets and maintain platelet count greater than 20,000. Recommend irradiated blood products until an etiology can be determined per Dr. Grayland Ormond. Follow-up CBC.  2. Essential hypertension: Norvasc, lisinopril 3. Hypothyroidism unspecified: Synthroid. 4. Chronic kidney disease stage 4.  baseline creatinine around 3, stable, continue sodium bicarbonate 5. Venous thromboembolic embolism prophylactic: SCDs given thrombocytopenia  * DM. On sliding scale.   All the records are reviewed and case discussed with Care  Management/Social Workerr. Management plans discussed with the patient, his daughter and a sister and they are in agreement.  CODE STATUS: Full code  TOTAL TIME TAKING CARE OF THIS PATIENT: 38 minutes.  Greater than 50% time was spent on coordination of care and face-to-face counseling.  POSSIBLE D/C IN 2-3 DAYS, DEPENDING ON CLINICAL CONDITION.   Demetrios Loll M.D on 07/26/2015 at 2:30 PM  Between 7am to 6pm - Pager - [phone]  After 6pm go to [email or web address] - password [email or web address] [facility]  Office  [phone]  CC: Primary care physician; Casilda Carls, MD

## 2015-07-27 LAB — PARVOVIRUS B19 ANTIBODY, IGG AND IGM
Parovirus B19 IgG Abs: 8.3 index — ABNORMAL HIGH (ref 0.0–0.8)
Parovirus B19 IgM Abs: 0.5 index (ref 0.0–0.8)

## 2015-07-27 LAB — EPSTEIN-BARR VIRUS VCA ANTIBODY PANEL
EBV EARLY ANTIGEN AB, IGG: 11.4 U/mL — AB (ref 0.0–8.9)
EBV NA IGG: 437 U/mL — AB (ref 0.0–17.9)

## 2015-07-27 LAB — PREPARE PLATELET PHERESIS
UNIT DIVISION: 0
Unit division: 0

## 2015-07-27 LAB — BASIC METABOLIC PANEL
ANION GAP: 13 (ref 5–15)
BUN: 55 mg/dL — AB (ref 6–20)
CHLORIDE: 110 mmol/L (ref 101–111)
CO2: 15 mmol/L — ABNORMAL LOW (ref 22–32)
Calcium: 9.4 mg/dL (ref 8.9–10.3)
Creatinine, Ser: 3.29 mg/dL — ABNORMAL HIGH (ref 0.61–1.24)
GFR calc Af Amer: 19 mL/min — ABNORMAL LOW (ref >60)
GFR, EST NON AFRICAN AMERICAN: 16 mL/min — AB (ref >60)
GLUCOSE: 293 mg/dL — AB (ref 65–99)
POTASSIUM: 5.1 mmol/L (ref 3.5–5.1)
Sodium: 138 mmol/L (ref 135–145)

## 2015-07-27 LAB — CBC
HEMATOCRIT: 33 % — AB (ref 40.0–52.0)
HEMOGLOBIN: 11.4 g/dL — AB (ref 13.0–18.0)
MCH: 32.3 pg (ref 26.0–34.0)
MCHC: 34.4 g/dL (ref 32.0–36.0)
MCV: 93.9 fL (ref 80.0–100.0)
Platelets: 19 10*3/uL — CL (ref 150–440)
RBC: 3.52 MIL/uL — ABNORMAL LOW (ref 4.40–5.90)
RDW: 14.4 % (ref 11.5–14.5)
WBC: 3.8 10*3/uL (ref 3.8–10.6)

## 2015-07-27 LAB — GLUCOSE, CAPILLARY
GLUCOSE-CAPILLARY: 246 mg/dL — AB (ref 65–99)
GLUCOSE-CAPILLARY: 477 mg/dL — AB (ref 65–99)
GLUCOSE-CAPILLARY: 324 mg/dL — AB (ref 65–99)
Glucose-Capillary: 328 mg/dL — ABNORMAL HIGH (ref 65–99)

## 2015-07-27 LAB — CMV IGM: CMV IgM: <30 AU/mL (ref 0.0–29.9)

## 2015-07-27 MED ORDER — SODIUM CHLORIDE 0.9 % IV SOLN
Freq: Once | INTRAVENOUS | Status: AC
  Administered 2015-07-27: 11:00:00 via INTRAVENOUS

## 2015-07-27 MED ORDER — INSULIN GLARGINE 100 UNIT/ML ~~LOC~~ SOLN
12.0000 [IU] | Freq: Every day | SUBCUTANEOUS | Status: DC
  Administered 2015-07-27: 12 [IU] via SUBCUTANEOUS
  Filled 2015-07-27 (×2): qty 0.12

## 2015-07-27 MED ORDER — INSULIN ASPART 100 UNIT/ML ~~LOC~~ SOLN
10.0000 [IU] | Freq: Once | SUBCUTANEOUS | Status: AC
Start: 2015-07-27 — End: 2015-07-27
  Administered 2015-07-27: 12:00:00 10 [IU] via SUBCUTANEOUS
  Filled 2015-07-27: qty 10

## 2015-07-27 NOTE — Progress Notes (Signed)
[place] at [place] NAME: Alejandro Armstrong    MR#:  [id number]  DATE OF BIRTH:  March 19, 1930  SUBJECTIVE:  CHIEF COMPLAINT:  No chief complaint on file. No complaint.  REVIEW OF SYSTEMS:  CONSTITUTIONAL: No fever, fatigue or weakness.  EYES: No blurred or double vision.  EARS, NOSE, AND THROAT: No tinnitus or ear pain.  RESPIRATORY: No cough, shortness of breath, wheezing or hemoptysis.  CARDIOVASCULAR: No chest pain, orthopnea, edema.  GASTROINTESTINAL: No nausea, vomiting, diarrhea or abdominal pain. No melena or bloody stool. GENITOURINARY: No dysuria, hematuria.  ENDOCRINE: No polyuria, nocturia,  HEMATOLOGY: No anemia, easy bruising or bleeding SKIN: No rash or lesion. No new bruises. MUSCULOSKELETAL: No joint pain or arthritis.   NEUROLOGIC: No tingling, numbness, weakness.  PSYCHIATRY: No anxiety or depression.   DRUG ALLERGIES:  No Known Allergies  VITALS:  Blood pressure 137/63, pulse 73, temperature 97.6 F (36.4 C), temperature source Oral, resp. rate 20, height '5\' 7"'  (1.702 m), weight 80.196 kg (176 lb 12.8 oz), SpO2 99 %.  PHYSICAL EXAMINATION:  GENERAL:  82 y.o.-year-old patient lying in the bed with no acute distress.  EYES: Pupils equal, round, reactive to light and accommodation. No scleral icterus. Extraocular muscles intact.  HEENT: Head atraumatic, normocephalic. Oropharynx and nasopharynx clear.  NECK:  Supple, no jugular venous distention. No thyroid enlargement, no tenderness.  LUNGS: Normal breath sounds bilaterally, no wheezing, rales,rhonchi or crepitation. No use of accessory muscles of respiration.  CARDIOVASCULAR: S1, S2 normal. No murmurs, rubs, or gallops.  ABDOMEN: Soft, nontender, nondistended. Bowel sounds present. No organomegaly or mass.  EXTREMITIES: No pedal edema, cyanosis, or clubbing.  NEUROLOGIC: Cranial nerves II through XII are intact. Muscle strength 5/5 in all extremities.  Sensation intact. Gait not checked.  PSYCHIATRIC: The patient is alert and oriented x 3.  SKIN: No obvious rash, lesion, or ulcer. Multiple areas of ecchymosis.   LABORATORY PANEL:   CBC  Recent Labs Lab 07/27/15 0536  WBC 3.8  HGB 11.4*  HCT 33.0*  PLT 19*   ------------------------------------------------------------------------------------------------------------------  Chemistries   Recent Labs Lab 07/24/15 1310 07/27/15 0536  NA 135 138  K 4.9 5.1  CL 110 110  CO2 18* 15*  GLUCOSE 287* 293*  BUN 47* 55*  CREATININE 3.32* 3.29*  CALCIUM 8.8* 9.4  AST 22  --   ALT 19  --   ALKPHOS 148*  --   BILITOT 0.7  --    ------------------------------------------------------------------------------------------------------------------  Cardiac Enzymes No results for input(s): TROPONINI in the last 168 hours. ------------------------------------------------------------------------------------------------------------------  RADIOLOGY:  No results found.  EKG:   Orders placed or performed in visit on 05/21/11  . EKG 12-Lead    ASSESSMENT AND PLAN:   82 year old Caucasian gentleman history of essential hypertension, hypothyroidism unspecified presenting with thrombocytopenia  1. Thrombocytopenia with bruising: S/p transfusion of platelets, up to 37, but down to <5 yesterday, got 2 more units, Plt is 19 today. Transfusing 1 units today.  s/p bone marrow biopsy. Continue to transfuse platelets and maintain platelet count greater than 20,000. Recommend irradiated blood products until an etiology can be determined per Dr. Grayland Ormond.  Started prednisone per Dr. Grayland Ormond. Follow-up CBC.  2. Essential hypertension: Norvasc, lisinopril 3. Hypothyroidism unspecified: Synthroid. 4. Chronic kidney disease stage 4.  baseline creatinine around 3, stable, continue sodium bicarbonate 5. Venous thromboembolic embolism prophylactic: SCDs given thrombocytopenia  * DM with  hyperglycemia. On sliding scale. BS is high to 477 due to  steroid, add lantus 12 uints HS   All the records are reviewed and case discussed with Care Management/Social Workerr. Management plans discussed with the patient, his wife and they are in agreement.  CODE STATUS: Full code  TOTAL TIME TAKING CARE OF THIS PATIENT: 37 minutes.  Greater than 50% time was spent on coordination of care and face-to-face counseling.  POSSIBLE D/C IN 2-3 DAYS, DEPENDING ON CLINICAL CONDITION.   Demetrios Loll M.D on 07/27/2015 at 3:13 PM  Between 7am to 6pm - Pager - [phone]  After 6pm go to [email or web address] - password [email or web address] [facility]  Office  [phone]  CC: Primary care physician; Casilda Carls, MD

## 2015-07-27 NOTE — Progress Notes (Signed)
No definitive etiology of thrombocytopenia yet. Continue prednisone 1 mg/kg as ordered. Viral workup pending. Bone marrow biopsy results also pending. Preliminary flow cytometry only revealed nonspecific monocytic findings as well as a slight relative increase in eosinophils. Continue to transfuse with irradiated blood products and maintain platelet count greater than 20,000 if patient continues to actively bleed.  Will follow.

## 2015-07-27 NOTE — Care Management (Addendum)
Admitted to [facility] with the diagnosis of thrombocytopenia. Lives with wife Izora Gala [phone]). Seen Dr. Rosario Jacks on Monday. Sees Dr. Grayland [facility]rmond at the [facility] for  Cancer related services. Last Home Health services was 3 years ago. Can't remember the name of the agency. No skilled facility. Uses no aids for ambulation. Fell/slipped about a month ago. Good appetite. Takes care of all basic and instrumental activities of daily living himself, drives. Wife will transport. Shelbie Ammons RN MSN CCM Care Management [phone]

## 2015-07-27 NOTE — Progress Notes (Signed)
This encounter was created in error - please disregard.

## 2015-07-28 DIAGNOSIS — I10 Essential (primary) hypertension: Secondary | ICD-10-CM

## 2015-07-28 DIAGNOSIS — K5900 Constipation, unspecified: Secondary | ICD-10-CM

## 2015-07-28 DIAGNOSIS — E119 Type 2 diabetes mellitus without complications: Secondary | ICD-10-CM

## 2015-07-28 DIAGNOSIS — E039 Hypothyroidism, unspecified: Secondary | ICD-10-CM

## 2015-07-28 LAB — PREPARE PLATELET PHERESIS: UNIT DIVISION: 0

## 2015-07-28 LAB — CBC
HEMATOCRIT: 27.6 % — AB (ref 40.0–52.0)
HEMOGLOBIN: 9.7 g/dL — AB (ref 13.0–18.0)
MCH: 33.2 pg (ref 26.0–34.0)
MCHC: 35 g/dL (ref 32.0–36.0)
MCV: 95 fL (ref 80.0–100.0)
Platelets: 41 10*3/uL — ABNORMAL LOW (ref 150–440)
RBC: 2.91 MIL/uL — ABNORMAL LOW (ref 4.40–5.90)
RDW: 14.5 % (ref 11.5–14.5)
WBC: 7.4 10*3/uL (ref 3.8–10.6)

## 2015-07-28 LAB — GLUCOSE, CAPILLARY
Glucose-Capillary: 196 mg/dL — ABNORMAL HIGH (ref 65–99)
Glucose-Capillary: 350 mg/dL — ABNORMAL HIGH (ref 65–99)
Glucose-Capillary: 426 mg/dL — ABNORMAL HIGH (ref 65–99)
Glucose-Capillary: 316 mg/dL — ABNORMAL HIGH (ref 65–99)

## 2015-07-28 MED ORDER — BISACODYL 5 MG PO TBEC
5.0000 mg | DELAYED_RELEASE_TABLET | Freq: Every day | ORAL | Status: DC | PRN
  Administered 2015-07-28: 5 mg via ORAL
  Filled 2015-07-28 (×2): qty 1

## 2015-07-28 MED ORDER — DOCUSATE SODIUM 100 MG PO CAPS
100.0000 mg | ORAL_CAPSULE | Freq: Two times a day (BID) | ORAL | Status: DC
  Administered 2015-07-28 – 2015-07-29 (×2): 100 mg via ORAL
  Filled 2015-07-28 (×2): qty 1

## 2015-07-28 MED ORDER — INSULIN GLARGINE 100 UNIT/ML ~~LOC~~ SOLN
17.0000 [IU] | Freq: Every day | SUBCUTANEOUS | Status: DC
Start: 2015-07-28 — End: 2015-07-29
  Administered 2015-07-28: 22:00:00 17 [IU] via SUBCUTANEOUS
  Filled 2015-07-28 (×2): qty 0.17

## 2015-07-28 MED ORDER — CYANOCOBALAMIN 1000 MCG/ML IJ SOLN
1000.0000 ug | Freq: Every day | INTRAMUSCULAR | Status: AC
  Administered 2015-07-28 – 2015-07-29 (×2): 1000 ug via SUBCUTANEOUS
  Filled 2015-07-28 (×2): qty 1

## 2015-07-28 MED ORDER — DOCUSATE SODIUM 100 MG PO CAPS: 100.0000 mg | ORAL_CAPSULE | Freq: Two times a day (BID) | ORAL | Status: DC

## 2015-07-28 MED ORDER — INSULIN ASPART 100 UNIT/ML ~~LOC~~ SOLN
0.0000 [IU] | Freq: Three times a day (TID) | SUBCUTANEOUS | Status: DC
  Administered 2015-07-28 – 2015-07-29 (×2): 15 [IU] via SUBCUTANEOUS
  Administered 2015-07-29: 3 [IU] via SUBCUTANEOUS
  Filled 2015-07-28: qty 3
  Filled 2015-07-28 (×2): qty 15

## 2015-07-28 MED ORDER — INSULIN ASPART 100 UNIT/ML ~~LOC~~ SOLN
0.0000 [IU] | Freq: Every day | SUBCUTANEOUS | Status: DC
  Administered 2015-07-28: 4 [IU] via SUBCUTANEOUS
  Filled 2015-07-28: qty 4

## 2015-07-28 MED ORDER — INSULIN GLARGINE 100 UNIT/ML ~~LOC~~ SOLN
16.0000 [IU] | Freq: Every day | SUBCUTANEOUS | Status: DC
  Filled 2015-07-28: qty 0.16

## 2015-07-28 NOTE — Progress Notes (Signed)
[facility]  Telephone:([phone]) [phone] [phone]) [phone]  ID: Charma Igo OB: Jun 30, 1929  MR#: [id number]  EEF#:[id number]  Patient Care Team: Sherrie Mustache, MD as PCP - General (Internal Medicine)  CHIEF COMPLAINT: Thrombocytopenia.  INTERVAL HISTORY:  Mr. Rodelo has done very well since yesterday. He had 1 unit of platelets yesterday. He denies any bleeding, headaches, hemoptysis, blood in the stool or in the urine. His energy level is good, she has not moved his bowels in 3 days though.  REVIEW OF SYSTEMS:   Review of Systems  Constitutional: Negative.   Cardiovascular: Negative.   Genitourinary: Negative.   Musculoskeletal: Negative.   Neurological: Negative.   Endo/Heme/Allergies: Bruises/bleeds easily.    As per HPI. Otherwise, a complete review of systems is negatve.  PAST MEDICAL HISTORY: Past Medical History  Diagnosis Date  . Hyperlipidemia   . Hypertension   . Hypothyroidism   . Type 2 diabetes mellitus (HCC)   . Renal agenesis     discovered at age 50    PAST SURGICAL HISTORY: History reviewed. No pertinent past surgical history.  FAMILY HISTORY History reviewed. No pertinent family history.     ADVANCED DIRECTIVES:    HEALTH MAINTENANCE: Social History  Substance Use Topics  . Smoking status: Never Smoker   . Smokeless tobacco: Never Used  . Alcohol Use: No     Colonoscopy:  PAP:  Bone density:  Lipid panel:  No Known Allergies  Current Facility-Administered Medications  Medication Dose Route Frequency Provider Last Rate Last Dose  . acetaminophen (TYLENOL) tablet 650 mg  650 mg Oral Q6H PRN Wyatt Haste, MD       Or  . acetaminophen (TYLENOL) suppository 650 mg  650 mg Rectal Q6H PRN Wyatt Haste, MD      . amLODipine (NORVASC) tablet 5 mg  5 mg Oral Daily Wyatt Haste, MD   5 mg at 07/28/15 0750  . atorvastatin (LIPITOR) tablet 10 mg  10 mg Oral q1800 Wyatt Haste, MD   10 mg at 07/27/15 1716  .  cholecalciferol (VITAMIN D) tablet 1,000 Units  1,000 Units Oral Daily Wyatt Haste, MD   1,000 Units at 07/28/15 0750  . diphenhydrAMINE (BENADRYL) capsule 25 mg  25 mg Oral QHS PRN Milagros Loll, MD   25 mg at 07/27/15 2152  . ferrous sulfate tablet 325 mg  325 mg Oral Q breakfast Wyatt Haste, MD   325 mg at 07/28/15 0754  . insulin aspart (novoLOG) injection 0-5 Units  0-5 Units Subcutaneous QHS Shaune Pollack, MD   4 Units at 07/27/15 2153  . insulin aspart (novoLOG) injection 0-9 Units  0-9 Units Subcutaneous TID WC Shaune Pollack, MD   2 Units at 07/28/15 0750  . insulin glargine (LANTUS) injection 12 Units  12 Units Subcutaneous QHS Shaune Pollack, MD   12 Units at 07/27/15 2153  . levothyroxine (SYNTHROID, LEVOTHROID) tablet 125 mcg  125 mcg Oral QAC breakfast Wyatt Haste, MD   125 mcg at 07/28/15 0548  . lisinopril (PRINIVIL,ZESTRIL) tablet 20 mg  20 mg Oral Daily Wyatt Haste, MD   20 mg at 07/28/15 0751  . morphine 2 MG/ML injection 2 mg  2 mg Intravenous Q4H PRN Wyatt Haste, MD      . niacin tablet 500 mg  500 mg Oral QHS Wyatt Haste, MD   500 mg at 07/27/15 2152  . ondansetron (ZOFRAN) tablet 4 mg  4 mg Oral Q6H  PRN Lytle Butte, MD       Or  . ondansetron [facility]) injection 4 mg  4 mg Intravenous Q6H PRN Lytle Butte, MD      . oxybutynin (DITROPAN-XL) 24 hr tablet 5 mg  5 mg Oral QHS Lytle Butte, MD   5 mg at 07/27/15 2152  . oxyCODONE (Oxy IR/ROXICODONE) immediate release tablet 5 mg  5 mg Oral Q4H PRN Lytle Butte, MD      . predniSONE (DELTASONE) tablet 80 mg  80 mg Oral Daily [name], MD   80 mg at 07/28/15 0750  . sodium bicarbonate tablet 1,300 mg  1,300 mg Oral BID Lytle Butte, MD   1,300 mg at 07/28/15 0750    OBJECTIVE: Filed Vitals:   07/27/15 2003 07/28/15 0444  BP: 170/84 135/60  Pulse: 63 61  Temp:  97.5 F (36.4 C)  Resp:  17     Body mass index is 27.68 kg/(m^2).    ECOG FS:0 - Asymptomatic  General: Well-developed, well-nourished, no acute  distress. Eyes: Pink conjunctiva, anicteric sclera. Lungs: Clear to auscultation bilaterally. Heart: Regular rate and rhythm. No rubs, murmurs, or gallops. Abdomen: Soft, nontender, nondistended. No organomegaly noted, normoactive bowel sounds. Musculoskeletal: No edema, cyanosis, or clubbing. Neuro: Alert, answering all questions appropriately. Cranial nerves grossly intact. Skin: No rashes. Multiple ecchymosis noted. Psych: Normal affect.    LAB RESULTS:  Lab Results  Component Value Date   NA 138 07/27/2015   K 5.1 07/27/2015   CL 110 07/27/2015   CO2 15* 07/27/2015   GLUCOSE 293* 07/27/2015   BUN 55* 07/27/2015   CREATININE 3.29* 07/27/2015   CALCIUM 9.4 07/27/2015   PROT 7.5 07/24/2015   ALBUMIN 3.8 07/24/2015   AST 22 07/24/2015   ALT 19 07/24/2015   ALKPHOS 148* 07/24/2015   BILITOT 0.7 07/24/2015   GFRNONAA 16* 07/27/2015   GFRAA 19* 07/27/2015    Lab Results  Component Value Date   WBC 7.4 07/28/2015   NEUTROABS 4.0 07/24/2015   HGB 9.7* 07/28/2015   HCT 27.6* 07/28/2015   MCV 95.0 07/28/2015   PLT 41* 07/28/2015     STUDIES: Ct Biopsy  07/25/2015  CLINICAL DATA:  82 year old male with thrombocytopenia EXAM: CT-GUIDED BIOPSY BONE MARROW MEDICATIONS AND MEDICAL HISTORY: Versed 1.0 mg, Fentanyl 0 mcg. Additional Medications: None. ANESTHESIA/SEDATION: None PROCEDURE: The procedure risks, benefits, and alternatives were explained to the patient. Questions regarding the procedure were encouraged and answered. The patient understands and consents to the procedure. Scout CT of the pelvis was performed for surgical planning purposes. The posterior pelvis was prepped with Betadinein a sterile fashion, and a sterile drape was applied covering the operative field. A sterile gown and sterile gloves were used for the procedure. Local anesthesia was provided with 1% Lidocaine. We targeted the left posterior iliac bone for biopsy. The skin and subcutaneous tissues were  infiltrated with 1% lidocaine without epinephrine. A small stab incision was made with an 11 blade scalpel, and an 11 gauge Murphy needle was advanced with CT guidance to the posterior cortex. Manual forced was used to advance the needle through the posterior cortex and the stylet was removed. A bone marrow aspirate was retrieved and passed to a cytotechnologist in the room. The Murphy needle was then advanced without the stylet for a core biopsy. The core biopsy was retrieved and also passed to a cytotechnologist. Manual pressure was used for hemostasis and a sterile dressing was placed. No  complications were encountered no significant blood loss was encountered. Patient tolerated the procedure well and remained hemodynamically stable throughout. FINDINGS: Scout image demonstrates safe approach to posterior iliac bone. Images during the case demonstrate placement of 11 gauge Murphy needle COMPLICATIONS: None IMPRESSION: Status post CT-guided bone marrow biopsy, with tissue specimen sent to pathology for complete histopathologic analysis Signed, Dulcy Fanny. Earleen Newport, DO [facility] Electronically Signed   By: Corrie Mckusick D.O.   On: 07/25/2015 17:08    ASSESSMENT: Severe thrombocytopenia with bleeding and bruising.  PLAN:    1. 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 No need for additional platelet transfusion today. Continue with prednisone. We will start taper in the next 48 hours most likely. 2. Chronic renal insufficiency: By report, Creatinine is approximately patient's baseline. Monitor. 3. Anemia: Bone marrow biopsy and labs as above. No evidence of hemolysis.   4. B-12 deficiency: Patient's B-12 levels noted to be low, patient already received 1 intramuscular injection of thousand micrograms of vitamin B12. In the setting of  thrombocytopenia I would avoid intramuscular injections, however, I would recommend to administer vitamin B12 at thousand micrograms intravenously once a day for 6 more days, and then switch to weekly injections (hopefully, intramuscularly if platelet count normalizes) for 1 months, followed by monthly injections indefinitely.  Will follow.  If platelet count is stable tomorrow, he can be discharged home with a short follow-up in our clinic. Roxana Hires, MD   07/28/2015 11:06 AM

## 2015-07-28 NOTE — Progress Notes (Signed)
[place] [facility] [place] NAME: Alejandro Armstrong    MR#:  [id number]  DATE OF BIRTH:  Jun 18, 1929  SUBJECTIVE:  CHIEF COMPLAINT:  No chief complaint on file. No complaint.  REVIEW OF SYSTEMS:  CONSTITUTIONAL: No fever, fatigue or weakness.  EYES: No blurred or double vision.  EARS, NOSE, AND THROAT: No tinnitus or ear pain.  RESPIRATORY: No cough, shortness of breath, wheezing or hemoptysis.  CARDIOVASCULAR: No chest pain, orthopnea, edema.  GASTROINTESTINAL: No nausea, vomiting, diarrhea or abdominal pain. No melena or bloody stool. GENITOURINARY: No dysuria, hematuria.  ENDOCRINE: No polyuria, nocturia,  HEMATOLOGY: No anemia, easy bruising or bleeding SKIN: No rash or lesion. No new bruises. MUSCULOSKELETAL: No joint pain or arthritis.   NEUROLOGIC: No tingling, numbness, weakness.  PSYCHIATRY: No anxiety or depression.   DRUG ALLERGIES:  No Known Allergies  VITALS:  Blood pressure 135/60, pulse 61, temperature 97.5 F (36.4 C), temperature source Oral, resp. rate 17, height '5\' 7"'  (1.702 m), weight 80.196 kg (176 lb 12.8 oz), SpO2 100 %.  PHYSICAL EXAMINATION:  GENERAL:  82 y.o.-year-old patient lying in the bed with no acute distress.  EYES: Pupils equal, round, reactive to light and accommodation. No scleral icterus. Extraocular muscles intact.  HEENT: Head atraumatic, normocephalic. Oropharynx and nasopharynx clear.  NECK:  Supple, no jugular venous distention. No thyroid enlargement, no tenderness.  LUNGS: Normal breath sounds bilaterally, no wheezing, rales,rhonchi or crepitation. No use of accessory muscles of respiration.  CARDIOVASCULAR: S1, S2 normal. No murmurs, rubs, or gallops.  ABDOMEN: Soft, nontender, nondistended. Bowel sounds present. No organomegaly or mass.  EXTREMITIES: No pedal edema, cyanosis, or clubbing.  NEUROLOGIC: Cranial nerves II through XII are intact. Muscle strength 5/5 in all extremities.  Sensation intact. Gait not checked.  PSYCHIATRIC: The patient is alert and oriented x 3.  SKIN: No obvious rash, lesion, or ulcer. Multiple areas of ecchymosis.   LABORATORY PANEL:   CBC  Recent Labs Lab 07/28/15 0552  WBC 7.4  HGB 9.7*  HCT 27.6*  PLT 41*   ------------------------------------------------------------------------------------------------------------------  Chemistries   Recent Labs Lab 07/24/15 1310 07/27/15 0536  NA 135 138  K 4.9 5.1  CL 110 110  CO2 18* 15*  GLUCOSE 287* 293*  BUN 47* 55*  CREATININE 3.32* 3.29*  CALCIUM 8.8* 9.4  AST 22  --   ALT 19  --   ALKPHOS 148*  --   BILITOT 0.7  --    ------------------------------------------------------------------------------------------------------------------  Cardiac Enzymes No results for input(s): TROPONINI in the last 168 hours. ------------------------------------------------------------------------------------------------------------------  RADIOLOGY:  No results found.  EKG:   Orders placed or performed in visit on 05/21/11  . EKG 12-Lead    ASSESSMENT AND PLAN:   82 year old Caucasian gentleman history of essential hypertension, hypothyroidism unspecified presenting with thrombocytopenia  1. Thrombocytopenia with bruising: S/p transfusion of platelets, up to 37, but down to <5, got 2 more units, Plt was 19  And got 1 unit transfusion yesterday. Plt is 41.  s/p bone marrow biopsy. Continue to transfuse platelets and maintain platelet count greater than 20,000. Recommend irradiated blood products until an etiology can be determined per Dr. Grayland Ormond.  Started prednisone daily per Dr. Grayland Ormond. Follow-up CBC. * Anemia of chronic disease, stable.  2. Essential hypertension: Controlled, continue Norvasc, lisinopril 3. Hypothyroidism unspecified: Synthroid. 4. Chronic kidney disease stage 4.  baseline creatinine around 3, stable, continue sodium bicarbonate 5. Venous thromboembolic  embolism prophylactic: SCDs given thrombocytopenia  *  DM with hyperglycemia. On sliding scale. increase lantus 16 uints HS.  * B-12 deficiency.  Per Dr, Wynell Balloon, administer vitamin B12 1000 micrograms intravenously once a day for 6 more days, and then switch to weekly injections for 1 months, followed by monthly injections indefinitely.  I discussed with  Dr, Wynell Balloon. All the records are reviewed and case discussed with Care Management/Social Workerr. Management plans discussed with the patient, his wife and they are in agreement.  CODE STATUS: Full code  TOTAL TIME TAKING CARE OF THIS PATIENT: 36 minutes.  Greater than 50% time was spent on coordination of care and face-to-face counseling.  POSSIBLE D/C IN 1 DAYS, DEPENDING ON CLINICAL CONDITION.   Demetrios Loll M.D on 07/28/2015 at 3:10 PM  Between 7am to 6pm - Pager - [phone]  After 6pm go to [email or web address] - password [email or web address] [place] [facility]  Office  [phone]  CC: Primary care physician; Casilda Carls, MD

## 2015-07-29 LAB — GLUCOSE, CAPILLARY
GLUCOSE-CAPILLARY: 182 mg/dL — AB (ref 65–99)
Glucose-Capillary: 377 mg/dL — ABNORMAL HIGH (ref 65–99)

## 2015-07-29 LAB — CBC
HCT: 26.6 % — ABNORMAL LOW (ref 40.0–52.0)
Hemoglobin: 9.3 g/dL — ABNORMAL LOW (ref 13.0–18.0)
MCH: 32.7 pg (ref 26.0–34.0)
MCHC: 35.1 g/dL (ref 32.0–36.0)
MCV: 93.2 fL (ref 80.0–100.0)
PLATELETS: 24 10*3/uL — AB (ref 150–440)
RBC: 2.85 MIL/uL — ABNORMAL LOW (ref 4.40–5.90)
RDW: 14.9 % — ABNORMAL HIGH (ref 11.5–14.5)
WBC: 8.5 10*3/uL (ref 3.8–10.6)

## 2015-07-29 MED ORDER — MAGNESIUM HYDROXIDE 400 MG/5ML PO SUSP: 30.0000 mL | Freq: Every day | ORAL | Status: DC | PRN

## 2015-07-29 MED ORDER — BISACODYL 5 MG PO TBEC: 10.0000 mg | DELAYED_RELEASE_TABLET | Freq: Every day | ORAL | Status: DC | PRN

## 2015-07-29 MED ORDER — PREDNISONE 20 MG PO TABS: 80.0000 mg | ORAL_TABLET | Freq: Every day | ORAL | Status: DC

## 2015-07-29 NOTE — Discharge Summary (Signed)
[place] at [place] NAME: Alejandro Armstrong    MR#:  [id number]  DATE OF BIRTH:  July 21, 1929  DATE OF ADMISSION:  07/24/2015 ADMITTING PHYSICIAN: Lytle Butte, MD  DATE OF DISCHARGE: 07/29/2015 PRIMARY CARE PHYSICIAN: Casilda Carls, MD    ADMISSION DIAGNOSIS:  Thrombocytopenia   DISCHARGE DIAGNOSIS:  Thrombocytopenia with bruising B-12 deficiency. SECONDARY DIAGNOSIS:   Past Medical History  Diagnosis Date  . Hyperlipidemia   . Hypertension   . Hypothyroidism   . Type 2 diabetes mellitus ([place])   . Renal agenesis     discovered at age 19    HOSPITAL COURSE:   1. Thrombocytopenia with bruising: He got transfusion of platelets, up to 37, but down to <5, got 2 more units, Plt was 19 And got 1 unit transfusion. Plt was 41 yesterday but down to 24 today.  s/p bone marrow biopsy. Continue to transfuse platelets and maintain platelet count greater than 20,000. Recommend irradiated blood products until an etiology can be determined per Dr. Grayland Ormond. Started prednisone daily per Dr. Grayland Ormond. The patient can be discharged with prednisone daily today and follow-up as outpatient tomorrow per Dr. Wynell Balloon. Follow-up CBC as outpatient. Discontinued niacin due to decreased platelet.  * Anemia of chronic disease, stable.  2. Essential hypertension: Controlled, continue Norvasc, lisinopril 3. Hypothyroidism unspecified: Synthroid. 4. Chronic kidney disease stage 4. baseline creatinine around 3, stable, continue sodium bicarbonate 5. Venous thromboembolic embolism prophylactic: SCDs given thrombocytopenia  * DM with hyperglycemia. On sliding scale. increased lantus 16 uints HS. BS is better controlled.  * B-12 deficiency.  Per Dr, Wynell Balloon, administer vitamin B12 1000 micrograms intravenously once a day for 6 more days, and then switch to weekly injections for 1 months, followed by monthly injections indefinitely.  I discussed with  Dr, Wynell Balloon.  DISCHARGE CONDITIONS:   Stable, discharge to home today.  CONSULTS OBTAINED:  Treatment Team:  Lytle Butte, MD [name], MD  DRUG ALLERGIES:  No Known Allergies  DISCHARGE MEDICATIONS:   Current Discharge Medication List    START taking these medications   Details  predniSONE (DELTASONE) 20 MG tablet Take 4 tablets (80 mg total) by mouth daily. Qty: 20 tablet, Refills: 0   Associated Diagnoses: Thrombocytopenia ([place])      CONTINUE these medications which have NOT CHANGED   Details  amLODipine (NORVASC) 5 MG tablet Take 5 mg by mouth.    cholecalciferol (VITAMIN D) 1000 units tablet Take 1,000 Units by mouth daily.    ferrous sulfate 325 (65 FE) MG tablet Take 325 mg by mouth.    levothyroxine (SYNTHROID, LEVOTHROID) 125 MCG tablet     lisinopril (PRINIVIL,ZESTRIL) 20 MG tablet Take 20 mg by mouth.    Omega-3 1000 MG CAPS Take by mouth.    sodium bicarbonate 650 MG tablet       STOP taking these medications     niacin 500 MG tablet          DISCHARGE INSTRUCTIONS:    If you experience worsening of your admission symptoms, develop shortness of breath, life threatening emergency, suicidal or homicidal thoughts you must seek medical attention immediately by calling 911 or calling your MD immediately  if symptoms less severe.  You Must read complete instructions/literature along with all the possible adverse reactions/side effects for all the Medicines you take and that have been prescribed to you. Take any new Medicines after you have completely understood and accept all the possible  adverse reactions/side effects.   Please note  You were cared for by a hospitalist during your hospital stay. If you have any questions about your discharge medications or the care you received while you were in the hospital after you are discharged, you can call the unit and asked to speak with the hospitalist on call if the hospitalist that took care of  you is not available. Once you are discharged, your primary care physician will handle any further medical issues. Please note that NO REFILLS for any discharge medications will be authorized once you are discharged, as it is imperative that you return to your primary care physician (or establish a relationship with a primary care physician if you do not have one) for your aftercare needs so that they can reassess your need for medications and monitor your lab values.    Today   SUBJECTIVE   No complaint.   VITAL SIGNS:  Blood pressure 129/66, pulse 58, temperature 98 F (36.7 C), temperature source Oral, resp. rate 18, height '5\' 7"'  (1.702 m), weight 80.196 kg (176 lb 12.8 oz), SpO2 100 %.  I/O:   Intake/Output Summary (Last 24 hours) at 07/29/15 1237 Last data filed at 07/29/15 0900  Gross per 24 hour  Intake    720 ml  Output    200 ml  Net    520 ml    PHYSICAL EXAMINATION:  GENERAL:  82 y.o.-year-old patient lying in the bed with no acute distress.  EYES: Pupils equal, round, reactive to light and accommodation. No scleral icterus. Extraocular muscles intact.  HEENT: Head atraumatic, normocephalic. Oropharynx and nasopharynx clear.  NECK:  Supple, no jugular venous distention. No thyroid enlargement, no tenderness.  LUNGS: Normal breath sounds bilaterally, no wheezing, rales,rhonchi or crepitation. No use of accessory muscles of respiration.  CARDIOVASCULAR: S1, S2 normal. No murmurs, rubs, or gallops.  ABDOMEN: Soft, non-tender, non-distended. Bowel sounds present. No organomegaly or mass.  EXTREMITIES: No pedal edema, cyanosis, or clubbing.  NEUROLOGIC: Cranial nerves II through XII are intact. Muscle strength 5/5 in all extremities. Sensation intact. Gait not checked.  PSYCHIATRIC: The patient is alert and oriented x 3.  SKIN: No obvious rash, lesion, or ulcer. Previous bruises on extremities.  DATA REVIEW:   CBC  Recent Labs Lab 07/29/15 0413  WBC 8.5  HGB 9.3*   HCT 26.6*  PLT 24*    Chemistries   Recent Labs Lab 07/24/15 1310 07/27/15 0536  NA 135 138  K 4.9 5.1  CL 110 110  CO2 18* 15*  GLUCOSE 287* 293*  BUN 47* 55*  CREATININE 3.32* 3.29*  CALCIUM 8.8* 9.4  AST 22  --   ALT 19  --   ALKPHOS 148*  --   BILITOT 0.7  --     Cardiac Enzymes No results for input(s): TROPONINI in the last 168 hours.  Microbiology Results  Results for orders placed or performed in visit on 10/15/12  Urine culture     Status: None   Collection Time: 10/15/12  9:14 PM  Result Value Ref Range Status   Micro Text Report   Final       SOURCE: cc    COMMENT                   MIXED BACTERIAL ORGANISMS   COMMENT                   RESULTS SUGGESTIVE OF CONTAMINATION   ANTIBIOTIC  RADIOLOGY:  No results found.     I discussed with Dr. Wynell Balloon. Management plans discussed with the patient, his wife and they are in agreement.  CODE STATUS:     Code Status Orders        Start     Ordered   07/24/15 1235  Full code   Continuous     07/24/15 1235    Code Status History    Date Active Date Inactive Code Status Order ID Comments User Context   This patient has a current code status but no historical code status.    Advance Directive Documentation        Most Recent Value   Type of Advance Directive  Living will   Pre-existing out of facility DNR order (yellow form or pink MOST form)     "MOST" Form in Place?        TOTAL TIME TAKING CARE OF THIS PATIENT: 35  minutes.    Demetrios Loll M.D on 07/29/2015 at 12:37 PM  Between 7am to 6pm - Pager - [phone]  After 6pm go to [email or web address] - password [email or web address] [place] [facility]  Office  [phone]  CC: Primary care physician; Casilda Carls, MD

## 2015-07-29 NOTE — Progress Notes (Signed)
MD order received in [facility] to discharge pt home today; verbally reviewed AVS with pt including medications/gave Rx for Prednisone to pt; diet//low sodium heart healthy; increase activity slowly; follow up appointments with Dr Rosario Jacks for 2 weeks and with Dr Grayland Ormond on 07/30/15/pt to call MD offices in am to schedule; no questions voiced at this time; pt's discharge pending him eating lunch

## 2015-07-29 NOTE — Plan of Care (Signed)
The patient is clinically stable, without bleeding. Platelet count dropped to 24,000 without transfusion. Patient can be discharged home on the current dose of prednisone. Patient will be seen in our clinic on Monday, 07/30/2015. Dyer Klug 07/29/2015 10:34 AM

## 2015-07-29 NOTE — Progress Notes (Signed)
Spoke with Dr. Marcille Blanco about pt platelet drop from 41 to 24. No new orders were received.

## 2015-07-29 NOTE — Progress Notes (Signed)
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 INTERVAL HISTORY:  Mr. Alejandro Armstrong has done well since yesterday. He has not required platelet transfusions. He denies any specific complaints.  He denies any bleeding, headaches, hemoptysis, blood in the stool or in the urine.    REVIEW OF SYSTEMS:   Review of Systems  Constitutional: Negative.   Cardiovascular: Negative.   Genitourinary: Negative.   Musculoskeletal: Negative.   Neurological: Negative.   Endo/Heme/Allergies: Bruises/bleeds easily.    As per HPI. Otherwise, a complete review of systems is negatve.  PAST MEDICAL HISTORY: Past Medical History  Diagnosis Date  . Hyperlipidemia   . Hypertension   . Hypothyroidism   . Type 2 diabetes mellitus ([place])   . Renal agenesis     discovered at age 26    PAST SURGICAL HISTORY: History reviewed. No pertinent past surgical history.  FAMILY HISTORY History reviewed. No pertinent family history.     ADVANCED DIRECTIVES:    HEALTH MAINTENANCE: Social History  Substance Use Topics  . Smoking status: Never Smoker   . Smokeless tobacco: Never Used  . Alcohol Use: No     Colonoscopy:  PAP:  Bone density:  Lipid panel:  No Known Allergies  Current Facility-Administered Medications  Medication Dose Route Frequency Provider Last Rate Last Dose  . acetaminophen (TYLENOL) tablet 650 mg  650 mg Oral Q6H PRN Lytle Butte, MD       Or  . acetaminophen (TYLENOL) suppository 650 mg  650 mg Rectal Q6H PRN Lytle Butte, MD      . amLODipine (NORVASC) tablet 5 mg  5 mg Oral Daily Lytle Butte, MD   5 mg at 07/29/15 0843  . atorvastatin (LIPITOR) tablet 10 mg  10 mg Oral q1800 Lytle Butte, MD   10 mg at 07/28/15 1707  . bisacodyl (DULCOLAX) EC  tablet 10 mg  10 mg Oral Daily PRN Demetrios Loll, MD      . cholecalciferol (VITAMIN D) tablet 1,000 Units  1,000 Units Oral Daily Lytle Butte, MD   1,000 Units at 07/29/15 0846  . diphenhydrAMINE (BENADRYL) capsule 25 mg  25 mg Oral QHS PRN Hillary Bow, MD   25 mg at 07/27/15 2152  . docusate sodium (COLACE) capsule 100 mg  100 mg Oral BID Demetrios Loll, MD   100 mg at 07/29/15 0843  . ferrous sulfate tablet 325 mg  325 mg Oral Q breakfast Lytle Butte, MD   325 mg at 07/29/15 0847  . insulin aspart (novoLOG) injection 0-15 Units  0-15 Units Subcutaneous TID WC Demetrios Loll, MD   3 Units at 07/29/15 [phone]  . insulin aspart (novoLOG) injection 0-5 Units  0-5 Units Subcutaneous QHS Demetrios Loll, MD   4 Units at 07/28/15 2222  . insulin glargine (LANTUS) injection 17 Units  17 Units Subcutaneous QHS Demetrios Loll, MD   17 Units at 07/28/15 2221  . levothyroxine (SYNTHROID, LEVOTHROID) tablet 125 mcg  125 mcg Oral QAC breakfast Lytle Butte, MD   125 mcg at 07/29/15 [phone]  . lisinopril (PRINIVIL,ZESTRIL) tablet 20 mg  20 mg Oral Daily Lytle Butte, MD   20 mg at 07/29/15 0902  . magnesium hydroxide (MILK OF MAGNESIA) suspension 30 mL  30 mL Oral Daily PRN Demetrios Loll, MD      .  morphine 2 MG/ML injection 2 mg  2 mg Intravenous Q4H PRN Lytle Butte, MD      . ondansetron [facility]) tablet 4 mg  4 mg Oral Q6H PRN Lytle Butte, MD       Or  . ondansetron [facility]) injection 4 mg  4 mg Intravenous Q6H PRN Lytle Butte, MD      . oxybutynin (DITROPAN-XL) 24 hr tablet 5 mg  5 mg Oral QHS Lytle Butte, MD   5 mg at 07/28/15 2221  . oxyCODONE (Oxy IR/ROXICODONE) immediate release tablet 5 mg  5 mg Oral Q4H PRN Lytle Butte, MD      . predniSONE (DELTASONE) tablet 80 mg  80 mg Oral Daily [name], MD   80 mg at 07/29/15 0846  . sodium bicarbonate tablet 1,300 mg  1,300 mg Oral BID Lytle Butte, MD   1,300 mg at 07/29/15 0843    OBJECTIVE: Filed Vitals:   07/29/15 0843 07/29/15 0845  BP: 129/66 129/66    Pulse:    Temp:  98 F (36.7 C)  Resp:  18     Body mass index is 27.68 kg/(m^2).    ECOG FS:0 - Asymptomatic  General: Well-developed, well-nourished, no acute distress. Eyes: Pink conjunctiva, anicteric sclera. Lungs: Clear to auscultation bilaterally. Heart: Regular rate and rhythm. No rubs, murmurs, or gallops. Abdomen: Soft, nontender, nondistended. No organomegaly noted, normoactive bowel sounds. Musculoskeletal: No edema, cyanosis, or clubbing. Neuro: Alert, answering all questions appropriately. Cranial nerves grossly intact. Skin: No rashes. Multiple ecchymosis noted. Psych: Normal affect.    LAB RESULTS:  Lab Results  Component Value Date   NA 138 07/27/2015   K 5.1 07/27/2015   CL 110 07/27/2015   CO2 15* 07/27/2015   GLUCOSE 293* 07/27/2015   BUN 55* 07/27/2015   CREATININE 3.29* 07/27/2015   CALCIUM 9.4 07/27/2015   PROT 7.5 07/24/2015   ALBUMIN 3.8 07/24/2015   AST 22 07/24/2015   ALT 19 07/24/2015   ALKPHOS 148* 07/24/2015   BILITOT 0.7 07/24/2015   GFRNONAA 16* 07/27/2015   GFRAA 19* 07/27/2015    Lab Results  Component Value Date   WBC 8.5 07/29/2015   NEUTROABS 4.0 07/24/2015   HGB 9.3* 07/29/2015   HCT 26.6* 07/29/2015   MCV 93.2 07/29/2015   PLT 24* 07/29/2015     STUDIES: Ct Biopsy  07/25/2015  CLINICAL DATA:  82 year old male with thrombocytopenia EXAM: CT-GUIDED BIOPSY BONE MARROW MEDICATIONS AND MEDICAL HISTORY: Versed 1.0 mg, Fentanyl 0 mcg. Additional Medications: None. ANESTHESIA/SEDATION: None PROCEDURE: The procedure risks, benefits, and alternatives were explained to the patient. Questions regarding the procedure were encouraged and answered. The patient understands and consents to the procedure. Scout CT of the pelvis was performed for surgical planning purposes. The posterior pelvis was prepped with Betadinein a sterile fashion, and a sterile drape was applied covering the operative field. A sterile gown and sterile gloves were  used for the procedure. Local anesthesia was provided with 1% Lidocaine. We targeted the left posterior iliac bone for biopsy. The skin and subcutaneous tissues were infiltrated with 1% lidocaine without epinephrine. A small stab incision was made with an 11 blade scalpel, and an 11 gauge Murphy needle was advanced with CT guidance to the posterior cortex. Manual forced was used to advance the needle through the posterior cortex and the stylet was removed. A bone marrow aspirate was retrieved and passed to a cytotechnologist in the room. The Murphy needle was  then advanced without the stylet for a core biopsy. The core biopsy was retrieved and also passed to a cytotechnologist. Manual pressure was used for hemostasis and a sterile dressing was placed. No complications were encountered no significant blood loss was encountered. Patient tolerated the procedure well and remained hemodynamically stable throughout. FINDINGS: Scout image demonstrates safe approach to posterior iliac bone. Images during the case demonstrate placement of 11 gauge Murphy needle COMPLICATIONS: None IMPRESSION: Status post CT-guided bone marrow biopsy, with tissue specimen sent to pathology for complete histopathologic analysis Signed, Dulcy Fanny. Earleen Newport, DO [facility] Radiology Electronically Signed   By: Corrie Mckusick D.O.   On: 07/25/2015 17:08    ASSESSMENT: Severe thrombocytopenia with bleeding and bruising.  PLAN:    1. 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 The patient can be discharged home today at the current dose of prednisone. He will be seen in our clinic tomorrow. 2. Chronic renal insufficiency: By report, Creatinine is approximately patient's baseline. Monitor. 3. Anemia: Bone marrow biopsy and labs as above. No evidence of hemolysis.   4.  B-12 deficiency: We will need to arrange vitamin B12 injections in our clinic next week.   Roxana Hires, MD   07/29/2015 11:48 AM

## 2015-07-31 ENCOUNTER — Inpatient Hospital Stay: Payer: Medicare HMO

## 2015-07-31 ENCOUNTER — Inpatient Hospital Stay (HOSPITAL_BASED_OUTPATIENT_CLINIC_OR_DEPARTMENT_OTHER): Payer: Medicare HMO | Admitting: Oncology

## 2015-07-31 ENCOUNTER — Inpatient Hospital Stay: Payer: Medicare HMO | Admitting: *Deleted

## 2015-07-31 VITALS — BP 157/84 | HR 59 | Temp 97.6°F | Resp 16 | Wt 183.6 lb

## 2015-07-31 VITALS — BP 149/88 | HR 90 | Temp 96.0°F | Resp 20

## 2015-07-31 DIAGNOSIS — Z87440 Personal history of urinary (tract) infections: Secondary | ICD-10-CM | POA: Diagnosis not present

## 2015-07-31 DIAGNOSIS — J9811 Atelectasis: Secondary | ICD-10-CM | POA: Diagnosis not present

## 2015-07-31 DIAGNOSIS — Z79899 Other long term (current) drug therapy: Secondary | ICD-10-CM | POA: Diagnosis not present

## 2015-07-31 DIAGNOSIS — D693 Immune thrombocytopenic purpura: Secondary | ICD-10-CM

## 2015-07-31 DIAGNOSIS — E039 Hypothyroidism, unspecified: Secondary | ICD-10-CM

## 2015-07-31 DIAGNOSIS — Q602 Renal agenesis, unspecified: Secondary | ICD-10-CM

## 2015-07-31 DIAGNOSIS — I129 Hypertensive chronic kidney disease with stage 1 through stage 4 chronic kidney disease, or unspecified chronic kidney disease: Secondary | ICD-10-CM

## 2015-07-31 DIAGNOSIS — T380X5A Adverse effect of glucocorticoids and synthetic analogues, initial encounter: Secondary | ICD-10-CM | POA: Diagnosis not present

## 2015-07-31 DIAGNOSIS — K449 Diaphragmatic hernia without obstruction or gangrene: Secondary | ICD-10-CM | POA: Diagnosis not present

## 2015-07-31 DIAGNOSIS — Z7952 Long term (current) use of systemic steroids: Secondary | ICD-10-CM | POA: Diagnosis not present

## 2015-07-31 DIAGNOSIS — R531 Weakness: Secondary | ICD-10-CM | POA: Diagnosis not present

## 2015-07-31 DIAGNOSIS — R5383 Other fatigue: Secondary | ICD-10-CM | POA: Diagnosis not present

## 2015-07-31 DIAGNOSIS — D696 Thrombocytopenia, unspecified: Secondary | ICD-10-CM

## 2015-07-31 DIAGNOSIS — I2510 Atherosclerotic heart disease of native coronary artery without angina pectoris: Secondary | ICD-10-CM | POA: Diagnosis not present

## 2015-07-31 DIAGNOSIS — E1122 Type 2 diabetes mellitus with diabetic chronic kidney disease: Secondary | ICD-10-CM

## 2015-07-31 DIAGNOSIS — N281 Cyst of kidney, acquired: Secondary | ICD-10-CM | POA: Diagnosis not present

## 2015-07-31 DIAGNOSIS — E1165 Type 2 diabetes mellitus with hyperglycemia: Secondary | ICD-10-CM | POA: Diagnosis not present

## 2015-07-31 DIAGNOSIS — E785 Hyperlipidemia, unspecified: Secondary | ICD-10-CM

## 2015-07-31 DIAGNOSIS — I517 Cardiomegaly: Secondary | ICD-10-CM | POA: Diagnosis not present

## 2015-07-31 DIAGNOSIS — Z794 Long term (current) use of insulin: Secondary | ICD-10-CM | POA: Diagnosis not present

## 2015-07-31 LAB — CBC WITH DIFFERENTIAL/PLATELET
BASOS ABS: 0 10*3/uL (ref 0–0.1)
Basophils Relative: 0 %
Eosinophils Absolute: 0 10*3/uL (ref 0–0.7)
Eosinophils Relative: 0 %
HEMATOCRIT: 30.6 % — AB (ref 40.0–52.0)
HEMOGLOBIN: 10.5 g/dL — AB (ref 13.0–18.0)
LYMPHS ABS: 1.1 10*3/uL (ref 1.0–3.6)
MCH: 32.4 pg (ref 26.0–34.0)
MCHC: 34.4 g/dL (ref 32.0–36.0)
MCV: 94.1 fL (ref 80.0–100.0)
Monocytes Absolute: 0.5 10*3/uL (ref 0.2–1.0)
Monocytes Relative: 4 %
NEUTROS ABS: 10.3 10*3/uL — AB (ref 1.4–6.5)
Neutrophils Relative %: 87 %
PLATELETS: 5 10*3/uL — AB (ref 150–440)
RBC: 3.25 MIL/uL — AB (ref 4.40–5.90)
RDW: 14.8 % — ABNORMAL HIGH (ref 11.5–14.5)
WBC: 11.9 10*3/uL — AB (ref 3.8–10.6)

## 2015-07-31 LAB — SAMPLE TO BLOOD BANK

## 2015-07-31 MED ORDER — DIPHENHYDRAMINE HCL 50 MG/ML IJ SOLN
12.5000 mg | Freq: Once | INTRAMUSCULAR | Status: AC
  Administered 2015-07-31: 12.5 mg via INTRAVENOUS
  Filled 2015-07-31: qty 1

## 2015-07-31 MED ORDER — ACETAMINOPHEN 325 MG PO TABS
650.0000 mg | ORAL_TABLET | Freq: Once | ORAL | Status: AC
  Administered 2015-07-31: 650 mg via ORAL
  Filled 2015-07-31: qty 2

## 2015-07-31 NOTE — Progress Notes (Signed)
Patient is taking the prednisone and reports that his bleeding has improved.

## 2015-08-01 ENCOUNTER — Other Ambulatory Visit: Payer: Self-pay | Admitting: Oncology

## 2015-08-01 DIAGNOSIS — D696 Thrombocytopenia, unspecified: Secondary | ICD-10-CM

## 2015-08-01 LAB — PREPARE PLATELET PHERESIS
UNIT DIVISION: 0
UNIT DIVISION: 0

## 2015-08-01 LAB — DIRECT PLATELET ANTIBODY

## 2015-08-01 MED ORDER — PREDNISONE 20 MG PO TABS: 80.0000 mg | ORAL_TABLET | Freq: Every day | ORAL | Status: DC

## 2015-08-02 ENCOUNTER — Inpatient Hospital Stay: Payer: Medicare HMO

## 2015-08-02 ENCOUNTER — Other Ambulatory Visit: Payer: Self-pay | Admitting: *Deleted

## 2015-08-02 ENCOUNTER — Inpatient Hospital Stay (HOSPITAL_BASED_OUTPATIENT_CLINIC_OR_DEPARTMENT_OTHER): Payer: Medicare HMO | Admitting: Oncology

## 2015-08-02 VITALS — BP 165/78 | HR 64 | Temp 97.7°F | Wt 186.1 lb

## 2015-08-02 DIAGNOSIS — Q602 Renal agenesis, unspecified: Secondary | ICD-10-CM

## 2015-08-02 DIAGNOSIS — I517 Cardiomegaly: Secondary | ICD-10-CM

## 2015-08-02 DIAGNOSIS — I129 Hypertensive chronic kidney disease with stage 1 through stage 4 chronic kidney disease, or unspecified chronic kidney disease: Secondary | ICD-10-CM | POA: Diagnosis not present

## 2015-08-02 DIAGNOSIS — D696 Thrombocytopenia, unspecified: Secondary | ICD-10-CM

## 2015-08-02 DIAGNOSIS — D693 Immune thrombocytopenic purpura: Secondary | ICD-10-CM | POA: Diagnosis not present

## 2015-08-02 DIAGNOSIS — E1122 Type 2 diabetes mellitus with diabetic chronic kidney disease: Secondary | ICD-10-CM

## 2015-08-02 DIAGNOSIS — T380X5A Adverse effect of glucocorticoids and synthetic analogues, initial encounter: Secondary | ICD-10-CM

## 2015-08-02 DIAGNOSIS — E1165 Type 2 diabetes mellitus with hyperglycemia: Secondary | ICD-10-CM

## 2015-08-02 DIAGNOSIS — E785 Hyperlipidemia, unspecified: Secondary | ICD-10-CM

## 2015-08-02 DIAGNOSIS — Z7952 Long term (current) use of systemic steroids: Secondary | ICD-10-CM

## 2015-08-02 DIAGNOSIS — Z79899 Other long term (current) drug therapy: Secondary | ICD-10-CM

## 2015-08-02 DIAGNOSIS — J9811 Atelectasis: Secondary | ICD-10-CM

## 2015-08-02 DIAGNOSIS — E039 Hypothyroidism, unspecified: Secondary | ICD-10-CM

## 2015-08-02 LAB — SAMPLE TO BLOOD BANK

## 2015-08-02 LAB — CBC WITH DIFFERENTIAL/PLATELET
Basophils Absolute: 0 10*3/uL (ref 0–0.1)
Basophils Relative: 0 %
Eosinophils Absolute: 0 10*3/uL (ref 0–0.7)
HEMATOCRIT: 31.3 % — AB (ref 40.0–52.0)
Hemoglobin: 11 g/dL — ABNORMAL LOW (ref 13.0–18.0)
LYMPHS ABS: 0.7 10*3/uL — AB (ref 1.0–3.6)
MCH: 33.5 pg (ref 26.0–34.0)
MCHC: 35.1 g/dL (ref 32.0–36.0)
MCV: 95.4 fL (ref 80.0–100.0)
MONO ABS: 0.4 10*3/uL (ref 0.2–1.0)
NEUTROS ABS: 11.1 10*3/uL — AB (ref 1.4–6.5)
Neutrophils Relative %: 91 %
Platelets: 10 10*3/uL — CL (ref 150–440)
RBC: 3.29 MIL/uL — ABNORMAL LOW (ref 4.40–5.90)
RDW: 15.2 % — AB (ref 11.5–14.5)
WBC: 12.2 10*3/uL — ABNORMAL HIGH (ref 3.8–10.6)

## 2015-08-02 NOTE — Progress Notes (Signed)
[place]  [phone]) [phone] [phone]) [phone]  ID: Alejandro Armstrong OB: 21-Mar-1930  MR#: [id number]  IDC#:[id number]  Patient Care Team: Casilda Carls, MD as PCP - General (Internal Medicine)  CHIEF COMPLAINT:  Chief Complaint  Patient presents with  . thrombocytopenia    INTERVAL HISTORY: Patient returns to clinic today for repeat laboratory work and further evaluation. He was recently admitted the hospital with severe thrombocytopenia. Bone marrow biopsy as well as infectious workup did not reveal a distinct etiology, therefore confirming the diagnosis of ITP. Patient was initiated on high-dose prednisone.  He currently feels well and is asymptomatic. He does not refer any further bleeding, but continues to have easy bruising. He has no neurologic complaint. He denies any fevers. He has a good appetite and denies weight loss. He denies any chest pain or shortness of breath. He denies any nausea, vomiting, constipation, or diarrhea. He has no urinary complaints. Patient otherwise feels well and offers no further specific complaints.   REVIEW OF SYSTEMS:   Review of Systems  Constitutional: Negative for fever, weight loss and malaise/fatigue.  Cardiovascular: Negative.   Gastrointestinal: Negative.  Negative for blood in stool and melena.  Genitourinary: Negative.   Musculoskeletal: Negative.   Neurological: Negative.  Negative for weakness.  Endo/Heme/Allergies: Does not bruise/bleed easily.    As per HPI. Otherwise, a complete review of systems is negatve.  PAST MEDICAL HISTORY: Past Medical History  Diagnosis Date  . Hyperlipidemia   . Hypertension   . Hypothyroidism   . Type 2 diabetes mellitus ([place])   . Renal agenesis     discovered at age 45    PAST SURGICAL HISTORY: No past surgical history on file.  FAMILY HISTORY: No reported history of malignancy or chronic disease.      ADVANCED DIRECTIVES:    HEALTH MAINTENANCE: Social  History  Substance Use Topics  . Smoking status: Never Smoker   . Smokeless tobacco: Never Used  . Alcohol Use: No     Colonoscopy:  PAP:  Bone density:  Lipid panel:  No Known Allergies  Current Outpatient Prescriptions  Medication Sig Dispense Refill  . amLODipine (NORVASC) 5 MG tablet Take 5 mg by mouth.    . cholecalciferol (VITAMIN D) 1000 units tablet Take 1,000 Units by mouth daily.    . ferrous sulfate 325 (65 FE) MG tablet Take 325 mg by mouth.    . levothyroxine (SYNTHROID, LEVOTHROID) 125 MCG tablet     . lisinopril (PRINIVIL,ZESTRIL) 20 MG tablet Take 20 mg by mouth.    . Omega-3 1000 MG CAPS Take by mouth.    . sodium bicarbonate 650 MG tablet     . predniSONE (DELTASONE) 20 MG tablet Take 4 tablets (80 mg total) by mouth daily. 60 tablet 2   No current facility-administered medications for this visit.    OBJECTIVE: Filed Vitals:   07/31/15 1141  BP: 157/84  Pulse: 59  Temp: 97.6 F (36.4 C)  Resp: 16     Body mass index is 28.76 kg/(m^2).    ECOG FS:0 - Asymptomatic  General: Well-developed, well-nourished, no acute distress. Eyes: Pink conjunctiva, anicteric sclera. Lungs: Clear to auscultation bilaterally. Heart: Regular rate and rhythm. No rubs, murmurs, or gallops. Abdomen: Soft, nontender, nondistended. No organomegaly noted, normoactive bowel sounds. Musculoskeletal: No edema, cyanosis, or clubbing. Neuro: Alert, answering all questions appropriately. Cranial nerves grossly intact. Skin: No rashes or petechiae noted. Psych: Normal affect. Lymphatics: No cervical, calvicular, axillary or inguinal LAD.  LAB RESULTS:  Lab Results  Component Value Date   NA 138 07/27/2015   K 5.1 07/27/2015   CL 110 07/27/2015   CO2 15* 07/27/2015   GLUCOSE 293* 07/27/2015   BUN 55* 07/27/2015   CREATININE 3.29* 07/27/2015   CALCIUM 9.4 07/27/2015   PROT 7.5 07/24/2015   ALBUMIN 3.8 07/24/2015   AST 22 07/24/2015   ALT 19 07/24/2015   ALKPHOS 148*  07/24/2015   BILITOT 0.7 07/24/2015   GFRNONAA 16* 07/27/2015   GFRAA 19* 07/27/2015    Lab Results  Component Value Date   WBC 12.2* 08/02/2015   NEUTROABS 11.1* 08/02/2015   HGB 11.0* 08/02/2015   HCT 31.3* 08/02/2015   MCV 95.4 08/02/2015   PLT 10* 08/02/2015     STUDIES: Ct Biopsy  07/25/2015  CLINICAL DATA:  82 year old male with thrombocytopenia EXAM: CT-GUIDED BIOPSY BONE MARROW MEDICATIONS AND MEDICAL HISTORY: Versed 1.0 mg, Fentanyl 0 mcg. Additional Medications: None. ANESTHESIA/SEDATION: None PROCEDURE: The procedure risks, benefits, and alternatives were explained to the patient. Questions regarding the procedure were encouraged and answered. The patient understands and consents to the procedure. Scout CT of the pelvis was performed for surgical planning purposes. The posterior pelvis was prepped with Betadinein a sterile fashion, and a sterile drape was applied covering the operative field. A sterile gown and sterile gloves were used for the procedure. Local anesthesia was provided with 1% Lidocaine. We targeted the left posterior iliac bone for biopsy. The skin and subcutaneous tissues were infiltrated with 1% lidocaine without epinephrine. A small stab incision was made with an 11 blade scalpel, and an 11 gauge Murphy needle was advanced with CT guidance to the posterior cortex. Manual forced was used to advance the needle through the posterior cortex and the stylet was removed. A bone marrow aspirate was retrieved and passed to a cytotechnologist in the room. The Murphy needle was then advanced without the stylet for a core biopsy. The core biopsy was retrieved and also passed to a cytotechnologist. Manual pressure was used for hemostasis and a sterile dressing was placed. No complications were encountered no significant blood loss was encountered. Patient tolerated the procedure well and remained hemodynamically stable throughout. FINDINGS: Scout image demonstrates safe approach to  posterior iliac bone. Images during the case demonstrate placement of 11 gauge Murphy needle COMPLICATIONS: None IMPRESSION: Status post CT-guided bone marrow biopsy, with tissue specimen sent to pathology for complete histopathologic analysis Signed, Dulcy Fanny. Earleen Newport, DO [facility] Radiology Electronically Signed   By: Corrie Mckusick D.O.   On: 07/25/2015 17:08    ASSESSMENT:  Bone marrow biopsy confirmed ITP.  PLAN:    1. ITP: Bone marrow biopsy revealed normal megakaryocytes as well as normal trilineage marrow paresis thus suggesting peripheral destruction of his platelets.  Infectious workup did not reveal an underlying viral etiology. Antiplatelet antibodies are pending at time of dictation. Patient had minimal response to platelet transfusion. Proceed with 2 units of platelets today. Patient will return to clinic in 2 days with repeat laboratory work and consideration of additional platelets. He has been instructed to continue his 80 mg prednisone daily despite there has been no significant improvement of his platelet count. Will plan to do IV Rituxan weekly starting on Friday. Finally, we will also get a noncontrast CT scan to assess for an underlying lymphoma as a possible etiology of his ITP. 2. Chronic renal insufficiency: Patient's creatinine is approximately his baseline. Continue follow-up with nephrology as scheduled.  Patient will require noncontrast CT as above.  Approximately 30 minutes was spent in discussion of which greater than 50% was consultation.   Patient expressed understanding and was in agreement with this plan. He also understands that He can call clinic at any time with any questions, concerns, or complaints.   [name], MD   08/02/2015 4:51 PM

## 2015-08-03 ENCOUNTER — Observation Stay: Payer: Medicare HMO

## 2015-08-03 ENCOUNTER — Inpatient Hospital Stay: Payer: Medicare HMO

## 2015-08-03 ENCOUNTER — Other Ambulatory Visit: Payer: Self-pay | Admitting: *Deleted

## 2015-08-03 ENCOUNTER — Inpatient Hospital Stay: Admit: 2015-08-03 | Payer: Self-pay

## 2015-08-03 ENCOUNTER — Observation Stay (HOSPITAL_BASED_OUTPATIENT_CLINIC_OR_DEPARTMENT_OTHER)
Admission: AD | Admit: 2015-08-03 | Discharge: 2015-08-04 | Disposition: A | Discharge status: Home / Self Care | Payer: Medicare HMO | Source: Ambulatory Visit | Attending: Oncology | Admitting: Oncology

## 2015-08-03 VITALS — BP 156/78 | HR 60 | Temp 96.6°F | Resp 20

## 2015-08-03 DIAGNOSIS — Z452 Encounter for adjustment and management of vascular access device: Secondary | ICD-10-CM

## 2015-08-03 DIAGNOSIS — E119 Type 2 diabetes mellitus without complications: Secondary | ICD-10-CM | POA: Diagnosis not present

## 2015-08-03 DIAGNOSIS — D693 Immune thrombocytopenic purpura: Secondary | ICD-10-CM | POA: Diagnosis not present

## 2015-08-03 DIAGNOSIS — I129 Hypertensive chronic kidney disease with stage 1 through stage 4 chronic kidney disease, or unspecified chronic kidney disease: Secondary | ICD-10-CM

## 2015-08-03 DIAGNOSIS — Q602 Renal agenesis, unspecified: Secondary | ICD-10-CM

## 2015-08-03 DIAGNOSIS — D649 Anemia, unspecified: Secondary | ICD-10-CM

## 2015-08-03 DIAGNOSIS — N189 Chronic kidney disease, unspecified: Secondary | ICD-10-CM

## 2015-08-03 DIAGNOSIS — E875 Hyperkalemia: Secondary | ICD-10-CM | POA: Diagnosis not present

## 2015-08-03 DIAGNOSIS — N390 Urinary tract infection, site not specified: Secondary | ICD-10-CM | POA: Diagnosis not present

## 2015-08-03 DIAGNOSIS — E039 Hypothyroidism, unspecified: Secondary | ICD-10-CM

## 2015-08-03 DIAGNOSIS — E1122 Type 2 diabetes mellitus with diabetic chronic kidney disease: Secondary | ICD-10-CM

## 2015-08-03 DIAGNOSIS — D696 Thrombocytopenia, unspecified: Secondary | ICD-10-CM

## 2015-08-03 DIAGNOSIS — E785 Hyperlipidemia, unspecified: Secondary | ICD-10-CM

## 2015-08-03 DIAGNOSIS — Z79899 Other long term (current) drug therapy: Secondary | ICD-10-CM

## 2015-08-03 MED ORDER — HEPARIN SOD (PORK) LOCK FLUSH 100 UNIT/ML IV SOLN: 500.0000 [IU] | Freq: Every day | INTRAVENOUS | Status: DC | PRN

## 2015-08-03 MED ORDER — ALUM & MAG HYDROXIDE-SIMETH 200-200-20 MG/5ML PO SUSP: 60.0000 mL | ORAL | Status: DC | PRN

## 2015-08-03 MED ORDER — DEXAMETHASONE SODIUM PHOSPHATE 10 MG/ML IJ SOLN
10.0000 mg | Freq: Once | INTRAMUSCULAR | Status: AC
  Administered 2015-08-03: 23:00:00 10 mg via INTRAVENOUS
  Filled 2015-08-03: qty 1

## 2015-08-03 MED ORDER — SODIUM CHLORIDE 0.9 % IV SOLN
375.0000 mg/m2 | Freq: Once | INTRAVENOUS | Status: AC
  Administered 2015-08-03: 21:00:00 700 mg via INTRAVENOUS
  Filled 2015-08-03: qty 70

## 2015-08-03 MED ORDER — SODIUM BICARBONATE 650 MG PO TABS
650.0000 mg | ORAL_TABLET | Freq: Every day | ORAL | Status: DC
  Administered 2015-08-04: 650 mg via ORAL
  Filled 2015-08-03 (×2): qty 1

## 2015-08-03 MED ORDER — VITAMIN D 1000 UNITS PO TABS
1000.0000 [IU] | ORAL_TABLET | Freq: Every day | ORAL | Status: DC
  Administered 2015-08-04: 09:00:00 1000 [IU] via ORAL
  Filled 2015-08-03: qty 1

## 2015-08-03 MED ORDER — SODIUM CHLORIDE 0.9% FLUSH: 10.0000 mL | INTRAVENOUS | Status: DC | PRN

## 2015-08-03 MED ORDER — ONDANSETRON HCL 4 MG PO TABS: 4.0000 mg | ORAL_TABLET | Freq: Three times a day (TID) | ORAL | Status: DC | PRN

## 2015-08-03 MED ORDER — SODIUM CHLORIDE 0.9% FLUSH: 3.0000 mL | INTRAVENOUS | Status: DC | PRN

## 2015-08-03 MED ORDER — SENNOSIDES-DOCUSATE SODIUM 8.6-50 MG PO TABS: 1.0000 | ORAL_TABLET | Freq: Every evening | ORAL | Status: DC | PRN

## 2015-08-03 MED ORDER — ONDANSETRON HCL 40 MG/20ML IJ SOLN
8.0000 mg | Freq: Three times a day (TID) | INTRAMUSCULAR | Status: DC | PRN
Start: 2015-08-03 — End: 2015-08-04

## 2015-08-03 MED ORDER — SODIUM CHLORIDE 0.9 % IV SOLN
250.0000 mL | Freq: Once | INTRAVENOUS | Status: DC
  Filled 2015-08-03: qty 250

## 2015-08-03 MED ORDER — ONDANSETRON 4 MG PO TBDP
4.0000 mg | ORAL_TABLET | Freq: Three times a day (TID) | ORAL | Status: DC | PRN
  Filled 2015-08-03: qty 2

## 2015-08-03 MED ORDER — HEPARIN SOD (PORK) LOCK FLUSH 100 UNIT/ML IV SOLN
250.0000 [IU] | INTRAVENOUS | Status: DC | PRN
Start: 2015-08-03 — End: 2015-08-04

## 2015-08-03 MED ORDER — DIPHENHYDRAMINE HCL 25 MG PO CAPS
25.0000 mg | ORAL_CAPSULE | Freq: Once | ORAL | Status: AC
  Administered 2015-08-03: 25 mg via ORAL
  Filled 2015-08-03: qty 1

## 2015-08-03 MED ORDER — LEVOTHYROXINE SODIUM 125 MCG PO TABS
125.0000 ug | ORAL_TABLET | Freq: Every day | ORAL | Status: DC
  Administered 2015-08-04: 125 ug via ORAL
  Filled 2015-08-03: qty 1

## 2015-08-03 MED ORDER — DIPHENHYDRAMINE HCL 50 MG/ML IJ SOLN
25.0000 mg | Freq: Once | INTRAMUSCULAR | Status: AC
  Administered 2015-08-03: 23:00:00 25 mg via INTRAVENOUS
  Filled 2015-08-03: qty 1

## 2015-08-03 MED ORDER — SODIUM CHLORIDE 0.9 % IV SOLN: 250.0000 mL | Freq: Once | INTRAVENOUS | Status: DC

## 2015-08-03 MED ORDER — ACETAMINOPHEN 325 MG PO TABS: 650.0000 mg | ORAL_TABLET | Freq: Four times a day (QID) | ORAL | Status: DC | PRN

## 2015-08-03 MED ORDER — HEPARIN SOD (PORK) LOCK FLUSH 100 UNIT/ML IV SOLN: 250.0000 [IU] | INTRAVENOUS | Status: DC | PRN

## 2015-08-03 MED ORDER — ACETAMINOPHEN 325 MG PO TABS
650.0000 mg | ORAL_TABLET | Freq: Once | ORAL | Status: AC
  Administered 2015-08-03: 650 mg via ORAL
  Filled 2015-08-03: qty 2

## 2015-08-03 MED ORDER — ACETAMINOPHEN 325 MG PO TABS
650.0000 mg | ORAL_TABLET | ORAL | Status: DC | PRN
  Filled 2015-08-03: qty 2

## 2015-08-03 MED ORDER — GUAIFENESIN-DM 100-10 MG/5ML PO SYRP: 10.0000 mL | ORAL_SOLUTION | ORAL | Status: DC | PRN

## 2015-08-03 MED ORDER — ACETAMINOPHEN 325 MG PO TABS: 650.0000 mg | ORAL_TABLET | Freq: Once | ORAL | Status: DC

## 2015-08-03 MED ORDER — OMEGA-3-ACID ETHYL ESTERS 1 G PO CAPS
1.0000 g | ORAL_CAPSULE | Freq: Every morning | ORAL | Status: DC
  Administered 2015-08-04: 1 g via ORAL
  Filled 2015-08-03: qty 1

## 2015-08-03 MED ORDER — DIPHENHYDRAMINE HCL 25 MG PO CAPS: 25.0000 mg | ORAL_CAPSULE | Freq: Four times a day (QID) | ORAL | Status: DC | PRN

## 2015-08-03 MED ORDER — LISINOPRIL 20 MG PO TABS
20.0000 mg | ORAL_TABLET | Freq: Every day | ORAL | Status: DC
  Administered 2015-08-04: 09:00:00 20 mg via ORAL
  Filled 2015-08-03: qty 1

## 2015-08-03 MED ORDER — HYDROCORTISONE 2.5 % RE CREA: 1.0000 "application " | TOPICAL_CREAM | Freq: Two times a day (BID) | RECTAL | Status: DC | PRN

## 2015-08-03 MED ORDER — ONDANSETRON HCL 4 MG/2ML IJ SOLN: 4.0000 mg | Freq: Three times a day (TID) | INTRAMUSCULAR | Status: DC | PRN

## 2015-08-03 MED ORDER — AMLODIPINE BESYLATE 5 MG PO TABS
5.0000 mg | ORAL_TABLET | Freq: Every day | ORAL | Status: DC
  Administered 2015-08-04: 09:00:00 5 mg via ORAL
  Filled 2015-08-03: qty 1

## 2015-08-03 MED ORDER — SODIUM CHLORIDE 0.9 % IV SOLN
250.0000 mL | Freq: Once | INTRAVENOUS | Status: AC
  Administered 2015-08-03: 15:00:00 250 mL via INTRAVENOUS

## 2015-08-03 MED ORDER — PREDNISONE 20 MG PO TABS
80.0000 mg | ORAL_TABLET | Freq: Every day | ORAL | Status: DC
  Administered 2015-08-04: 09:00:00 80 mg via ORAL
  Filled 2015-08-03: qty 4

## 2015-08-03 MED ORDER — FAMOTIDINE IN NACL 20-0.9 MG/50ML-% IV SOLN
20.0000 mg | Freq: Once | INTRAVENOUS | Status: AC
  Administered 2015-08-03: 20 mg via INTRAVENOUS
  Filled 2015-08-03 (×2): qty 50

## 2015-08-03 MED ORDER — ACETAMINOPHEN 325 MG PO TABS
650.0000 mg | ORAL_TABLET | Freq: Once | ORAL | Status: AC
  Administered 2015-08-03: 15:00:00 650 mg via ORAL

## 2015-08-03 MED ORDER — FERROUS SULFATE 325 (65 FE) MG PO TABS
325.0000 mg | ORAL_TABLET | Freq: Every day | ORAL | Status: DC
  Administered 2015-08-04: 325 mg via ORAL
  Filled 2015-08-03: qty 1

## 2015-08-03 MED ORDER — DIPHENHYDRAMINE HCL 25 MG PO CAPS: 25.0000 mg | ORAL_CAPSULE | ORAL | Status: DC | PRN

## 2015-08-03 NOTE — Plan of Care (Signed)
Problem: Education: Goal: Knowledge of [facility] Education information/materials will improve Outcome: Progressing Pt educated regarding platelet transfusion.  Consent signed  And also for  Pt to receive rituxan and consent signed.  Problem: Safety: Goal: Ability to remain free from injury will improve Outcome: Progressing Instructed to call for assist if he felt dizzy  Or unsteady on feet.  Pt steady on feet assisted to br to void and pass gas.   Problem: Tissue Perfusion: Goal: Risk factors for ineffective tissue perfusion will decrease Outcome: Progressing Received 2 units of platelets. Rt upper arm picc intact and has stopped bleeding.

## 2015-08-03 NOTE — Progress Notes (Signed)
Patient titrated to 200 per hr on first rituxan dose, now complaining or itching generalized over body. Spoke to Dr Janeth Rase for further instruction. Benadryl, pepcid and decadron IV ordered, continue with rituxan.

## 2015-08-03 NOTE — H&P (Signed)
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 Initially planned for 2 units of platelets as well as initiate IV Rituxan, but had poor vascular access required PICC line placement and admission given his platelet count of 10. He currently continues to have easy bruising, but denies bleeding. He is no neurologic complaints. He denies any fevers. He has no chest pain or shortness of breath. He denies any nausea, vomiting, constipation, or diarrhea. He has no urinary complaints. Patient feels at his baseline and offersno further specific complaints today.  REVIEW OF SYSTEMS:   Review of Systems  Constitutional: Negative for fever, weight loss and malaise/fatigue.  Respiratory: Negative.  Negative for cough.   Cardiovascular: Negative.  Negative for chest pain.  Gastrointestinal: Negative.  Negative for blood in stool and melena.  Musculoskeletal: Negative.   Neurological: Negative.  Negative for weakness.  Endo/Heme/Allergies: Bruises/bleeds easily.    As per HPI. Otherwise, a complete review of systems is negatve.  PAST MEDICAL HISTORY: Past Medical History  Diagnosis Date  . Hyperlipidemia   . Hypertension   . Hypothyroidism   . Type 2 diabetes mellitus ([place])   . Renal agenesis     discovered at age 82    PAST SURGICAL HISTORY: History reviewed. No pertinent past surgical history.  FAMILY HISTORY History reviewed. No pertinent family history.     ADVANCED DIRECTIVES:    HEALTH MAINTENANCE: Social History  Substance Use Topics  . Smoking status: Never Smoker   . Smokeless tobacco: Never Used  . Alcohol Use: No     Colonoscopy:  PAP:  Bone  density:  Lipid panel:  No Known Allergies  Current Facility-Administered Medications  Medication Dose Route Frequency Provider Last Rate Last Dose  . 0.9 %  sodium chloride infusion  250 mL Intravenous Once [name], MD      . 0.9 %  sodium chloride infusion  250 mL Intravenous Once [name], MD      . acetaminophen (TYLENOL) tablet 650 mg  650 mg Oral Once [name], MD      . acetaminophen (TYLENOL) tablet 650 mg  650 mg Oral Q4H PRN [name], MD      . acetaminophen (TYLENOL) tablet 650 mg  650 mg Oral Q6H PRN [name], MD      . acetaminophen (TYLENOL) tablet 650 mg  650 mg Oral Once [name], MD      . alum & mag hydroxide-simeth (MAALOX/MYLANTA) 200-200-20 MG/5ML suspension 60 mL  60 mL Oral Q4H PRN [name], MD      . amLODipine (NORVASC) tablet 5 mg  5 mg Oral Daily [name], MD      . cholecalciferol (VITAMIN D) tablet 1,000 Units  1,000 Units Oral Daily [name], MD      . diphenhydrAMINE (BENADRYL) capsule 25 mg  25 mg Oral Once [name], MD      . diphenhydrAMINE (BENADRYL) capsule 25 mg  25 mg Oral Q6H PRN [name], MD      . diphenhydrAMINE (BENADRYL) capsule 25 mg  25 mg Oral Once [name], MD      . Derrill Memo ON  08/04/2015] ferrous sulfate tablet 325 mg  325 mg Oral Q breakfast [name], MD      . guaiFENesin-dextromethorphan [facility] DM) 100-10 MG/5ML syrup 10 mL  10 mL Oral Q4H PRN [name], MD      . heparin lock flush 100 unit/mL  500 Units Intracatheter Daily PRN [name], MD      . heparin lock flush 100 unit/mL  250 Units Intracatheter PRN [name], MD      . heparin lock flush 100 unit/mL  500 Units Intracatheter Daily PRN [name], MD      . heparin lock flush 100 unit/mL  250 Units Intracatheter PRN [name], MD      . heparin lock flush 100 unit/mL  500 Units Intracatheter Daily PRN  [name], MD      . heparin lock flush 100 unit/mL  250 Units Intracatheter PRN [name], MD      . hydrocortisone (ANUSOL-HC) 2.5 % rectal cream 1 application  1 application Rectal BID PRN [name], MD      . Derrill Memo ON 08/04/2015] levothyroxine (SYNTHROID, LEVOTHROID) tablet 125 mcg  125 mcg Oral QAC breakfast [name], MD      . lisinopril (PRINIVIL,ZESTRIL) tablet 20 mg  20 mg Oral Daily [name], MD      . Omega-3 CAPS   Oral q morning - 10a [name], MD      . ondansetron (ZOFRAN) tablet 4-8 mg  4-8 mg Oral Q8H PRN [name], MD       Or  . ondansetron (ZOFRAN-ODT) disintegrating tablet 4-8 mg  4-8 mg Oral Q8H PRN [name], MD       Or  . ondansetron (ZOFRAN) injection 4 mg  4 mg Intravenous Q8H PRN [name], MD       Or  . ondansetron (ZOFRAN) 8 mg in sodium chloride 0.9 % 50 mL IVPB  8 mg Intravenous Q8H PRN [name], MD      . predniSONE (DELTASONE) tablet 80 mg  80 mg Oral Daily [name], MD      . riTUXimab (RITUXAN) 700 mg in sodium chloride 0.9 % 250 mL (2.1875 mg/mL) chemo infusion  375 mg/m2 (Treatment Plan Actual) Intravenous Once [name], MD      . senna-docusate (Senokot-S) tablet 1 tablet  1 tablet Oral QHS PRN [name], MD      . sodium bicarbonate tablet 650 mg  650 mg Oral Daily [name], MD      . sodium chloride flush (NS) 0.9 % injection 10 mL  10 mL Intracatheter PRN [name], MD      . sodium chloride flush (NS) 0.9 % injection 10 mL  10 mL Intracatheter PRN [name], MD      . sodium chloride flush (NS) 0.9 % injection 10 mL  10 mL Intracatheter PRN [name], MD      . sodium chloride flush (NS) 0.9 % injection 3 mL  3 mL Intracatheter PRN [name], MD      . sodium chloride flush (NS) 0.9 % injection 3 mL  3 mL Intracatheter PRN [name], MD      . sodium chloride flush (NS) 0.9 %  injection 3 mL  3 mL Intracatheter PRN [name], MD       Facility-Administered Medications  Ordered in Other Encounters  Medication Dose Route Frequency Provider Last Rate Last Dose  . 0.9 %  sodium chloride infusion  250 mL Intravenous Once [name], MD   250 mL at 08/03/15 1130  . acetaminophen (TYLENOL) tablet 650 mg  650 mg Oral Once [name], MD   650 mg at 08/03/15 1130  . diphenhydrAMINE (BENADRYL) capsule 25 mg  25 mg Oral Q4H PRN [name], MD        OBJECTIVE: There were no vitals filed for this visit.   Body mass index is 29.37 kg/(m^2).    ECOG FS:0 - Asymptomatic  General: Well-developed, well-nourished, no acute distress. Eyes: Pink conjunctiva, anicteric sclera. HEENT: Normocephalic, moist mucous membranes, clear oropharnyx. Lungs: Clear to auscultation bilaterally. Heart: Regular rate and rhythm. No rubs, murmurs, or gallops. Abdomen: Soft, nontender, nondistended. No organomegaly noted, normoactive bowel sounds. Musculoskeletal: No edema, cyanosis, or clubbing. Neuro: Alert, answering all questions appropriately. Cranial nerves grossly intact. Skin: No rashes or petechiae noted. Psych: Normal affect. Lymphatics: No cervical, calvicular, axillary or inguinal LAD.   LAB RESULTS:  Lab Results  Component Value Date   NA 138 07/27/2015   K 5.1 07/27/2015   CL 110 07/27/2015   CO2 15* 07/27/2015   GLUCOSE 293* 07/27/2015   BUN 55* 07/27/2015   CREATININE 3.29* 07/27/2015   CALCIUM 9.4 07/27/2015   PROT 7.5 07/24/2015   ALBUMIN 3.8 07/24/2015   AST 22 07/24/2015   ALT 19 07/24/2015   ALKPHOS 148* 07/24/2015   BILITOT 0.7 07/24/2015   GFRNONAA 16* 07/27/2015   GFRAA 19* 07/27/2015    Lab Results  Component Value Date   WBC 12.2* 08/02/2015   NEUTROABS 11.1* 08/02/2015   HGB 11.0* 08/02/2015   HCT 31.3* 08/02/2015   MCV 95.4 08/02/2015   PLT 10* 08/02/2015     STUDIES: Ct Biopsy  07/25/2015  CLINICAL DATA:   82 year old male with thrombocytopenia EXAM: CT-GUIDED BIOPSY BONE MARROW MEDICATIONS AND MEDICAL HISTORY: Versed 1.0 mg, Fentanyl 0 mcg. Additional Medications: None. ANESTHESIA/SEDATION: None PROCEDURE: The procedure risks, benefits, and alternatives were explained to the patient. Questions regarding the procedure were encouraged and answered. The patient understands and consents to the procedure. Scout CT of the pelvis was performed for surgical planning purposes. The posterior pelvis was prepped with Betadinein a sterile fashion, and a sterile drape was applied covering the operative field. A sterile gown and sterile gloves were used for the procedure. Local anesthesia was provided with 1% Lidocaine. We targeted the left posterior iliac bone for biopsy. The skin and subcutaneous tissues were infiltrated with 1% lidocaine without epinephrine. A small stab incision was made with an 11 blade scalpel, and an 11 gauge Murphy needle was advanced with CT guidance to the posterior cortex. Manual forced was used to advance the needle through the posterior cortex and the stylet was removed. A bone marrow aspirate was retrieved and passed to a cytotechnologist in the room. The Murphy needle was then advanced without the stylet for a core biopsy. The core biopsy was retrieved and also passed to a cytotechnologist. Manual pressure was used for hemostasis and a sterile dressing was placed. No complications were encountered no significant blood loss was encountered. Patient tolerated the procedure well and remained hemodynamically stable throughout. FINDINGS: Scout image demonstrates safe approach to posterior iliac bone. Images during the case demonstrate placement of 11 gauge Murphy needle COMPLICATIONS: None IMPRESSION: Status post CT-guided bone marrow biopsy, with tissue specimen sent to  pathology for complete histopathologic analysis Signed, Dulcy Fanny. Earleen Newport, DO [facility]  Radiology Electronically Signed   By: Corrie Mckusick D.O.   On: 07/25/2015 17:08    ASSESSMENT: Bone marrow biopsy confirmed ITP.  PLAN:    1. ITP: Bone marrow biopsy revealed normal megakaryocytes as well as normal trilineage hematopoiesis thus suggesting peripheral destruction of his platelets. Infectious workup did not reveal an underlying viral etiology. Antiplatelet antibodies are pending at time of dictation. Patient has poor vascular access require PICC line placement which will remain in place for the next 4-6 weeks. Previously, patient had minimal response to platelet transfusion but given the fact that his platelet count is 10 will proceed with 2 units of platelets today. Patient will continue 80 mg of prednisone daily and will initiate cycle 1 of 4 of IV Rituxan. Noncontrast CT scan is scheduled for next week to assess for any underlying lymphoma. Repeat CBC in the morning. 2. Chronic renal insufficiency: Patient's creatinine is approximately his baseline. Continue follow-up with nephrology as scheduled. Patient will require noncontrast CT as above. 3. Disposition: Admit to observation discharge in the morning provided no complications or acute events.  [name], MD   08/03/2015 2:35 PM

## 2015-08-03 NOTE — Progress Notes (Signed)
5 peripheral IV attempts made unsuccessfully.  I attempt by R. Rosana Hoes RN, 2 by Ciro Backer RN, and 2 by Sinclair Grooms RN.  Dr. Grayland Ormond notified.

## 2015-08-04 LAB — CBC
HEMATOCRIT: 23.2 % — AB (ref 40.0–52.0)
HEMOGLOBIN: 8 g/dL — AB (ref 13.0–18.0)
MCH: 33.5 pg (ref 26.0–34.0)
MCHC: 34.4 g/dL (ref 32.0–36.0)
MCV: 97.4 fL (ref 80.0–100.0)
Platelets: 72 10*3/uL — ABNORMAL LOW (ref 150–440)
RBC: 2.39 MIL/uL — AB (ref 4.40–5.90)
RDW: 15 % — ABNORMAL HIGH (ref 11.5–14.5)
WBC: 7.8 10*3/uL (ref 3.8–10.6)

## 2015-08-04 LAB — PREPARE PLATELET PHERESIS
Unit division: 0
Unit division: 0

## 2015-08-04 NOTE — Progress Notes (Signed)
Patient discharged per MD orders. Dressing to PICC changed. All discharge instructions given and all questions answered. Escorted by volunteers.

## 2015-08-04 NOTE — Care Management Obs Status (Signed)
[place] NOTIFICATION   Patient Details  Name: Alejandro Armstrong MRN: [id number] Date of Birth: 1930-03-02   Medicare Observation Status Notification Given:  No (Discharged in less than 24 hours)    Ival Bible, RN 08/04/2015, 12:48 PM

## 2015-08-05 ENCOUNTER — Other Ambulatory Visit: Payer: Self-pay

## 2015-08-05 ENCOUNTER — Inpatient Hospital Stay
Admission: EM | Admit: 2015-08-05 | Discharge: 2015-08-08 | DRG: 690 | Disposition: A | Discharge status: Home / Self Care | Payer: Medicare HMO | Attending: Internal Medicine | Admitting: Internal Medicine

## 2015-08-05 DIAGNOSIS — N179 Acute kidney failure, unspecified: Secondary | ICD-10-CM | POA: Diagnosis present

## 2015-08-05 DIAGNOSIS — N289 Disorder of kidney and ureter, unspecified: Secondary | ICD-10-CM

## 2015-08-05 DIAGNOSIS — E860 Dehydration: Secondary | ICD-10-CM | POA: Diagnosis present

## 2015-08-05 DIAGNOSIS — E872 Acidosis: Secondary | ICD-10-CM | POA: Diagnosis present

## 2015-08-05 DIAGNOSIS — D631 Anemia in chronic kidney disease: Secondary | ICD-10-CM | POA: Diagnosis present

## 2015-08-05 DIAGNOSIS — R7989 Other specified abnormal findings of blood chemistry: Secondary | ICD-10-CM

## 2015-08-05 DIAGNOSIS — E1165 Type 2 diabetes mellitus with hyperglycemia: Secondary | ICD-10-CM | POA: Diagnosis present

## 2015-08-05 DIAGNOSIS — Z79899 Other long term (current) drug therapy: Secondary | ICD-10-CM

## 2015-08-05 DIAGNOSIS — E039 Hypothyroidism, unspecified: Secondary | ICD-10-CM | POA: Diagnosis present

## 2015-08-05 DIAGNOSIS — E785 Hyperlipidemia, unspecified: Secondary | ICD-10-CM | POA: Diagnosis present

## 2015-08-05 DIAGNOSIS — R197 Diarrhea, unspecified: Secondary | ICD-10-CM | POA: Diagnosis present

## 2015-08-05 DIAGNOSIS — R778 Other specified abnormalities of plasma proteins: Secondary | ICD-10-CM

## 2015-08-05 DIAGNOSIS — Z8049 Family history of malignant neoplasm of other genital organs: Secondary | ICD-10-CM

## 2015-08-05 DIAGNOSIS — T380X5A Adverse effect of glucocorticoids and synthetic analogues, initial encounter: Secondary | ICD-10-CM | POA: Diagnosis present

## 2015-08-05 DIAGNOSIS — E875 Hyperkalemia: Secondary | ICD-10-CM

## 2015-08-05 DIAGNOSIS — K859 Acute pancreatitis, unspecified: Secondary | ICD-10-CM

## 2015-08-05 DIAGNOSIS — E1122 Type 2 diabetes mellitus with diabetic chronic kidney disease: Secondary | ICD-10-CM | POA: Diagnosis present

## 2015-08-05 DIAGNOSIS — N2581 Secondary hyperparathyroidism of renal origin: Secondary | ICD-10-CM | POA: Diagnosis present

## 2015-08-05 DIAGNOSIS — N400 Enlarged prostate without lower urinary tract symptoms: Secondary | ICD-10-CM | POA: Diagnosis present

## 2015-08-05 DIAGNOSIS — N184 Chronic kidney disease, stage 4 (severe): Secondary | ICD-10-CM | POA: Diagnosis present

## 2015-08-05 DIAGNOSIS — N189 Chronic kidney disease, unspecified: Secondary | ICD-10-CM

## 2015-08-05 DIAGNOSIS — Q602 Renal agenesis, unspecified: Secondary | ICD-10-CM

## 2015-08-05 DIAGNOSIS — D693 Immune thrombocytopenic purpura: Secondary | ICD-10-CM

## 2015-08-05 DIAGNOSIS — R739 Hyperglycemia, unspecified: Secondary | ICD-10-CM

## 2015-08-05 DIAGNOSIS — I10 Essential (primary) hypertension: Secondary | ICD-10-CM | POA: Diagnosis present

## 2015-08-05 DIAGNOSIS — N390 Urinary tract infection, site not specified: Principal | ICD-10-CM | POA: Diagnosis present

## 2015-08-05 DIAGNOSIS — Z87440 Personal history of urinary (tract) infections: Secondary | ICD-10-CM

## 2015-08-05 DIAGNOSIS — D72829 Elevated white blood cell count, unspecified: Secondary | ICD-10-CM

## 2015-08-05 HISTORY — DX: Crossing vessel and stricture of ureter without hydronephrosis: N13.5

## 2015-08-05 HISTORY — DX: Urinary tract infection, site not specified: N39.0

## 2015-08-05 LAB — CBC
HEMATOCRIT: 29.4 % — AB (ref 40.0–52.0)
Hemoglobin: 9.9 g/dL — ABNORMAL LOW (ref 13.0–18.0)
MCH: 32.6 pg (ref 26.0–34.0)
MCHC: 33.7 g/dL (ref 32.0–36.0)
MCV: 96.8 fL (ref 80.0–100.0)
PLATELETS: 65 10*3/uL — AB (ref 150–440)
RBC: 3.03 MIL/uL — ABNORMAL LOW (ref 4.40–5.90)
RDW: 15.4 % — AB (ref 11.5–14.5)
WBC: 22.8 10*3/uL — AB (ref 3.8–10.6)

## 2015-08-05 LAB — COMPREHENSIVE METABOLIC PANEL
ALBUMIN: 3.5 g/dL (ref 3.5–5.0)
ALT: 25 U/L (ref 17–63)
AST: 27 U/L (ref 15–41)
Alkaline Phosphatase: 121 U/L (ref 38–126)
Anion gap: 12 (ref 5–15)
BUN: 73 mg/dL — AB (ref 6–20)
CHLORIDE: 104 mmol/L (ref 101–111)
CO2: 15 mmol/L — AB (ref 22–32)
CREATININE: 3.15 mg/dL — AB (ref 0.61–1.24)
Calcium: 7.9 mg/dL — ABNORMAL LOW (ref 8.9–10.3)
GFR calc Af Amer: 20 mL/min — ABNORMAL LOW (ref >60)
GFR, EST NON AFRICAN AMERICAN: 17 mL/min — AB (ref >60)
GLUCOSE: 518 mg/dL — AB (ref 65–99)
POTASSIUM: 5.9 mmol/L — AB (ref 3.5–5.1)
SODIUM: 131 mmol/L — AB (ref 135–145)
Total Bilirubin: 0.9 mg/dL (ref 0.3–1.2)
Total Protein: 6.5 g/dL (ref 6.5–8.1)

## 2015-08-05 LAB — LIPASE, BLOOD: Lipase: 96 U/L — ABNORMAL HIGH (ref 11–51)

## 2015-08-05 LAB — GLUCOSE, CAPILLARY: GLUCOSE-CAPILLARY: 275 mg/dL — AB (ref 65–99)

## 2015-08-05 LAB — TROPONIN I: Troponin I: 0.05 ng/mL — ABNORMAL HIGH (ref <0.031)

## 2015-08-05 MED ORDER — IOHEXOL 240 MG/ML SOLN
25.0000 mL | INTRAMUSCULAR | Status: DC
  Administered 2015-08-05: 25 mL via ORAL

## 2015-08-05 MED ORDER — SODIUM BICARBONATE 8.4 % IV SOLN
50.0000 meq | Freq: Once | INTRAVENOUS | Status: AC
  Administered 2015-08-06: 50 meq via INTRAVENOUS
  Filled 2015-08-05: qty 50

## 2015-08-05 MED ORDER — SODIUM CHLORIDE 0.9 % IV SOLN
1.0000 g | Freq: Once | INTRAVENOUS | Status: AC
  Administered 2015-08-06: 1 g via INTRAVENOUS
  Filled 2015-08-05: qty 10

## 2015-08-05 MED ORDER — IOHEXOL 240 MG/ML SOLN: 25.0000 mL | Freq: Once | INTRAMUSCULAR | Status: DC | PRN

## 2015-08-05 MED ORDER — SODIUM CHLORIDE 0.9 % IV BOLUS (SEPSIS)
2000.0000 mL | Freq: Once | INTRAVENOUS | Status: AC
  Administered 2015-08-05: 2000 mL via INTRAVENOUS

## 2015-08-05 MED ORDER — ONDANSETRON HCL 4 MG/2ML IJ SOLN
4.0000 mg | Freq: Once | INTRAMUSCULAR | Status: AC | PRN
  Administered 2015-08-05: 4 mg via INTRAVENOUS
  Filled 2015-08-05: qty 2

## 2015-08-05 MED ORDER — INSULIN ASPART 100 UNIT/ML ~~LOC~~ SOLN
6.0000 [IU] | Freq: Once | SUBCUTANEOUS | Status: AC
  Administered 2015-08-05: 6 [IU] via INTRAVENOUS
  Filled 2015-08-05: qty 6

## 2015-08-05 NOTE — Discharge Summary (Signed)
[place]  Telephone:(336) [phone] Fax:(336) [phone]  ID: [name] OB: 02/17/1930  MR#: [id number]  VUY#:[id number]  Patient Care Team: Casilda Carls, MD as PCP - General (Internal Medicine)  CHIEF COMPLAINT:  ITP.  INTERVAL HISTORY:  Patient's platelet count improved to 72 with 2 units of platelets and initiation of IV Rituxan. He has also been instructed to continue 80 mg per prednisone daily. Patient offered no specific complaints on day of discharge.  REVIEW OF SYSTEMS:   Review of Systems  Constitutional: Negative.  Negative for fever, weight loss and malaise/fatigue.  Respiratory: Negative.  Negative for shortness of breath.   Cardiovascular: Negative.  Negative for chest pain.  Gastrointestinal: Negative.  Negative for blood in stool and melena.  Musculoskeletal: Negative.   Neurological: Negative.  Negative for weakness.  Endo/Heme/Allergies: Bruises/bleeds easily.    As per HPI. Otherwise, a complete review of systems is negatve.  PAST MEDICAL HISTORY: Past Medical History  Diagnosis Date  . Hyperlipidemia   . Hypertension   . Hypothyroidism   . Type 2 diabetes mellitus ([place])   . Renal agenesis     discovered at age 82    PAST SURGICAL HISTORY: History reviewed. No pertinent past surgical history.  FAMILY HISTORY History reviewed. No pertinent family history.     ADVANCED DIRECTIVES:    HEALTH MAINTENANCE: Social History  Substance Use Topics  . Smoking status: Never Smoker   . Smokeless tobacco: Never Used  . Alcohol Use: No     Colonoscopy:  PAP:  Bone density:  Lipid panel:  No Known Allergies  No current facility-administered medications for this encounter.   Current Outpatient Prescriptions  Medication Sig Dispense Refill  . calcitRIOL (ROCALTROL) 0.25 MCG capsule Take 0.25 mcg by mouth daily.    [name] amLODipine (NORVASC) 5 MG tablet Take 5 mg by mouth.    . cholecalciferol (VITAMIN D) 1000 units tablet  Take 1,000 Units by mouth daily.    . ferrous sulfate 325 (65 FE) MG tablet Take 325 mg by mouth.    . levothyroxine (SYNTHROID, LEVOTHROID) 125 MCG tablet     . lisinopril (PRINIVIL,ZESTRIL) 20 MG tablet Take 20 mg by mouth.    . Omega-3 1000 MG CAPS Take by mouth.    . predniSONE (DELTASONE) 20 MG tablet Take 4 tablets (80 mg total) by mouth daily. 60 tablet 2  . sodium bicarbonate 650 MG tablet       OBJECTIVE: Filed Vitals:   08/04/15 0030 08/04/15 0449  BP: 154/74 144/88  Pulse: 61 73  Temp: 97.5 F (36.4 C) 98.1 F (36.7 C)  Resp: 18 18     Body mass index is 29.37 kg/(m^2).    ECOG FS:0 - Asymptomatic  General: Well-developed, well-nourished, no acute distress. Eyes: Pink conjunctiva, anicteric sclera. HEENT: Normocephalic, moist mucous membranes, clear oropharnyx. Lungs: Clear to auscultation bilaterally. Heart: Regular rate and rhythm. No rubs, murmurs, or gallops. Abdomen: Soft, nontender, nondistended. No organomegaly noted, normoactive bowel sounds. Musculoskeletal: No edema, cyanosis, or clubbing. Neuro: Alert, answering all questions appropriately. Cranial nerves grossly intact. Skin: No rashes or petechiae noted. Psych: Normal affect. Lymphatics: No cervical, calvicular, axillary or inguinal LAD.   LAB RESULTS:  Lab Results  Component Value Date   NA 138 07/27/2015   K 5.1 07/27/2015   CL 110 07/27/2015   CO2 15* 07/27/2015   GLUCOSE 293* 07/27/2015   BUN 55* 07/27/2015   CREATININE 3.29* 07/27/2015   CALCIUM 9.4 07/27/2015  PROT 7.5 07/24/2015   ALBUMIN 3.8 07/24/2015   AST 22 07/24/2015   ALT 19 07/24/2015   ALKPHOS 148* 07/24/2015   BILITOT 0.7 07/24/2015   GFRNONAA 16* 07/27/2015   GFRAA 19* 07/27/2015    Lab Results  Component Value Date   WBC 7.8 08/04/2015   NEUTROABS 11.1* 08/02/2015   HGB 8.0* 08/04/2015   HCT 23.2* 08/04/2015   MCV 97.4 08/04/2015   PLT 72* 08/04/2015     STUDIES: Ct Biopsy  07/25/2015  CLINICAL DATA:   82 year old male with thrombocytopenia EXAM: CT-GUIDED BIOPSY BONE MARROW MEDICATIONS AND MEDICAL HISTORY: Versed 1.0 mg, Fentanyl 0 mcg. Additional Medications: None. ANESTHESIA/SEDATION: None PROCEDURE: The procedure risks, benefits, and alternatives were explained to the patient. Questions regarding the procedure were encouraged and answered. The patient understands and consents to the procedure. Scout CT of the pelvis was performed for surgical planning purposes. The posterior pelvis was prepped with Betadinein a sterile fashion, and a sterile drape was applied covering the operative field. A sterile gown and sterile gloves were used for the procedure. Local anesthesia was provided with 1% Lidocaine. We targeted the left posterior iliac bone for biopsy. The skin and subcutaneous tissues were infiltrated with 1% lidocaine without epinephrine. A small stab incision was made with an 11 blade scalpel, and an 11 gauge Murphy needle was advanced with CT guidance to the posterior cortex. Manual forced was used to advance the needle through the posterior cortex and the stylet was removed. A bone marrow aspirate was retrieved and passed to a cytotechnologist in the room. The Murphy needle was then advanced without the stylet for a core biopsy. The core biopsy was retrieved and also passed to a cytotechnologist. Manual pressure was used for hemostasis and a sterile dressing was placed. No complications were encountered no significant blood loss was encountered. Patient tolerated the procedure well and remained hemodynamically stable throughout. FINDINGS: Scout image demonstrates safe approach to posterior iliac bone. Images during the case demonstrate placement of 11 gauge Murphy needle COMPLICATIONS: None IMPRESSION: Status post CT-guided bone marrow biopsy, with tissue specimen sent to pathology for complete histopathologic analysis Signed, Dulcy Fanny. Earleen Newport, DO [facility]  Radiology Electronically Signed   By: Corrie Mckusick D.O.   On: 07/25/2015 17:08   Dg Chest Port 1 View  08/03/2015  CLINICAL DATA:  Bedside PICC placement. Recent diagnosis of idiopathic thrombocytopenic purpura. EXAM: PORTABLE CHEST 1 VIEW COMPARISON:  12/13/2010. FINDINGS: Right arm PICC tip projects over the mid SVC. Suboptimal inspiration with mild bibasilar atelectasis. Lungs otherwise clear. Cardiac silhouette mildly enlarged, unchanged. Pulmonary vascularity normal. No pleural effusions. IMPRESSION: 1. Right arm PICC tip projects over the mid SVC. 2. Stable mild cardiomegaly. Suboptimal inspiration accounts for mild bibasilar atelectasis. No acute cardiopulmonary disease otherwise. Electronically Signed   By: Evangeline Dakin M.D.   On: 08/03/2015 15:00    ASSESSMENT:  Bone marrow biopsy confirmed ITP.  PLAN:    1. ITP: Bone marrow biopsy revealed normal megakaryocytes as well as normal trilineage hematopoiesis thus suggesting peripheral destruction of his platelets. Infectious workup did not reveal an underlying viral etiology. Antiplatelet antibodies are pending at time of dictation.  PICC line has been placed and patient received 2 units of platelets along with cycle 1 of 4 of 375 mg/m IV Rituxan. Patient's platelet count improved to 72 on day of discharge. He has been instructed to continue 80 mg prednisone daily which we will taper in the next 1-2 weeks.  Return to clinic on Monday for repeat laboratory work and further evaluation. Noncontrast CT to assess for underlying lymphoma is scheduled for Tuesday as an outpatient. 2. Chronic renal insufficiency: Patient's creatinine is approximately his baseline. Continue follow-up with nephrology as scheduled. Patient will require noncontrast CT as above.  3. Anemia:  Unclear etiology of patient's drop in hemoglobin, repeat laboratory Monday as above. 4. Disposition:  Discharge home today.   [name], MD   08/05/2015 8:51 AM

## 2015-08-05 NOTE — ED Notes (Signed)
Pt with vomiting since 1900 today. Pt with recent admission, discharged this am for low platelets. Pt denies blood in emesis. Pt denies pain, resps tachypnea, pt with fsbs of 465 per ems.

## 2015-08-05 NOTE — ED Provider Notes (Signed)
[facility] Emergency Department Provider Note  ____________________________________________  Time seen: 9:40 PM  I have reviewed the triage vital signs and the nursing notes.   HISTORY  Chief Complaint Emesis    HPI Alejandro Armstrong is a 82 y.o. male being followed by Dr. Grayland Ormond for ITP, chronic renal insufficiency, hyperglycemia due to prednisone use. Brought to ED tonight due to hyperglycemia at home with a blood sugar of about 400. Family was concerned that he might have a stroke because of the blood sugar 400. Patient denies any symptoms whatsoever. No fevers chills chest pain shortness of breath abdominal pain. He is eating and drinking normally.  Was recently restarted on Lantus 20 units once a day due to hyperglycemia. No sliding scale insulin at this point. Has been having polyuria and polydipsia.  Denies taking any steroids.  Patient seems to minimize all symptoms   Past Medical History  Diagnosis Date  . Hyperlipidemia   . Hypertension   . Hypothyroidism   . Type 2 diabetes mellitus ([place])   . Renal agenesis     discovered at age 34     Patient Active Problem List   Diagnosis Date Noted  . ITP (idiopathic thrombocytopenic purpura) 08/02/2015  . Thrombocytopenia ([place]) 07/24/2015     No past surgical history on file.   Current Outpatient Rx  Name  Route  Sig  Dispense  Refill  . amLODipine (NORVASC) 5 MG tablet   Oral   Take 5 mg by mouth.         . calcitRIOL (ROCALTROL) 0.25 MCG capsule   Oral   Take 0.25 mcg by mouth daily.         . cholecalciferol (VITAMIN D) 1000 units tablet   Oral   Take 1,000 Units by mouth daily.         . ferrous sulfate 325 (65 FE) MG tablet   Oral   Take 325 mg by mouth.         . levothyroxine (SYNTHROID, LEVOTHROID) 125 MCG tablet               . lisinopril (PRINIVIL,ZESTRIL) 20 MG tablet   Oral   Take 20 mg by mouth.         . Omega-3 1000 MG CAPS   Oral   Take  by mouth.         . predniSONE (DELTASONE) 20 MG tablet   Oral   Take 4 tablets (80 mg total) by mouth daily.   60 tablet   2   . sodium bicarbonate 650 MG tablet                  Allergies Review of patient's allergies indicates no known allergies.   No family history on file.  Social History Social History  Substance Use Topics  . Smoking status: Never Smoker   . Smokeless tobacco: Never Used  . Alcohol Use: No    Review of Systems  Constitutional:   No fever or chills. No weight changes Eyes:   No blurry vision or double vision.  ENT:   No sore throat.  Cardiovascular:   No chest pain. Respiratory:   No dyspnea or cough. Gastrointestinal:   Negative for abdominal pain, vomiting and diarrhea.  No BRBPR or melena. Genitourinary:   Negative for dysuria or difficulty urinating. Increased urinary frequency Musculoskeletal:   Negative for back pain. No joint swelling or pain. Skin:   Negative for rash. Neurological:  Negative for headaches, focal weakness or numbness. Psychiatric:  No anxiety or depression.   Endocrine:  No changes in energy or sleep difficulty. Increased thirst  10-point ROS otherwise negative.  ____________________________________________   PHYSICAL EXAM:  VITAL SIGNS: ED Triage Vitals  Enc Vitals Group     BP 08/05/15 2110 186/80 mmHg     Pulse Rate 08/05/15 2110 108     Resp 08/05/15 2110 24     Temp 08/05/15 2110 97.9 F (36.6 C)     Temp Source 08/05/15 2110 Oral     SpO2 08/05/15 2110 96 %     Weight 08/05/15 2110 175 lb (79.379 kg)     Height 08/05/15 2110 5\' 7"  (1.702 m)     Head Cir --      Peak Flow --      Pain Score --      Pain Loc --      Pain Edu? --      Excl. in [place]? --     Vital signs reviewed, nursing assessments reviewed.   Constitutional:   Alert and oriented. Well appearing and in no distress. Eyes:   No scleral icterus. No conjunctival pallor. PERRL. EOMI ENT   Head:   Normocephalic and  atraumatic.   Nose:   No congestion/rhinnorhea. No septal hematoma   Mouth/Throat:   Dry mucous membranes, no pharyngeal erythema. No peritonsillar mass.    Neck:   No stridor. No SubQ emphysema. No meningismus. Hematological/Lymphatic/Immunilogical:   No cervical lymphadenopathy. Cardiovascular:   RRR. Symmetric bilateral radial and DP pulses.  No murmurs.  Respiratory:   Tachypnea. Breath sounds equal symmetric and clear. No wheezes or rhonchi. Gastrointestinal:   Soft with epigastric tenderness. Non distended. There is no CVA tenderness.  No rebound, rigidity, or guarding. Genitourinary:   deferred Musculoskeletal:   Nontender with normal range of motion in all extremities. No joint effusions.  No lower extremity tenderness.  No edema. Neurologic:   Normal speech and language.  CN 2-10 normal. Motor grossly intact. No gross focal neurologic deficits are appreciated.  Skin:    Skin is warm, dry and intact. No rash noted.  No petechiae, purpura, or bullae. Psychiatric:   Mood and affect are normal. ____________________________________________    LABS (pertinent positives/negatives) (all labs ordered are listed, but only abnormal results are displayed) Labs Reviewed  LIPASE, BLOOD - Abnormal; Notable for the following:    Lipase 96 (*)    All other components within normal limits  COMPREHENSIVE METABOLIC PANEL - Abnormal; Notable for the following:    Sodium 131 (*)    Potassium 5.9 (*)    CO2 15 (*)    Glucose, Bld 518 (*)    BUN 73 (*)    Creatinine, Ser 3.15 (*)    Calcium 7.9 (*)    GFR calc non Af Amer 17 (*)    GFR calc Af Amer 20 (*)    All other components within normal limits  CBC - Abnormal; Notable for the following:    WBC 22.8 (*)    RBC 3.03 (*)    Hemoglobin 9.9 (*)    HCT 29.4 (*)    RDW 15.4 (*)    Platelets 65 (*)    All other components within normal limits  TROPONIN I - Abnormal; Notable for the following:    Troponin I 0.05 (*)    All other  components within normal limits  GLUCOSE, CAPILLARY - Abnormal; Notable for the following:  Glucose-Capillary 275 (*)    All other components within normal limits  URINALYSIS COMPLETEWITH MICROSCOPIC ([facility] ONLY)  TROPONIN I  CBG MONITORING, ED   ____________________________________________   EKG  Interpreted by me Sinus tachycardia rate 107, normal axis and intervals. Normal QRS and ST segments and T waves  ____________________________________________    RADIOLOGY  CT chest abdomen pelvis pending  ____________________________________________   PROCEDURES   ____________________________________________   INITIAL IMPRESSION / ASSESSMENT AND PLAN / ED COURSE  Pertinent labs & imaging results that were available during my care of the patient were reviewed by me and considered in my medical decision making (see chart for details).  Patient brought to ED by family due to hyperglycemia. Patient denies any other symptoms, however he has had fairly substantial medical problems recently. He is following up closely with her doctors at the cancer center, but examined labs are concerning for pancreatitis which may also be related to his hyperglycemia today. He also has multiple other lab abnormalities including severe leukocytosis that is not related to steroid use, elevated troponin level, and hyperkalemia. We'll give IV fluids for the hyperglycemia, insulin calcium and bicarbonate for the hyperkalemia. Due to incongruity between the patient's reported symptoms and lab abnormalities, will proceed with CT scan of the chest abdomen pelvis to further evaluate for occult pathology. Plan for hospitalization due to pancreatitis and hyperglycemia.  Case signed out to Dr. Dahlia Client 11:40 PM pending CT scans. We'll discussed with hospitalist.   ____________________________________________   FINAL CLINICAL IMPRESSION(S) / ED DIAGNOSES  Final diagnoses:  Hyperglycemia  Hyperkalemia   Leukocytosis  Chronic renal insufficiency, unspecified stage  Elevated troponin   pancreatitis    Carrie Mew, MD 08/08/15 1215

## 2015-08-05 NOTE — Progress Notes (Signed)
[facility]  [phone] [phone]  ID: [name] OB: [birth year]  MR#: [id number]  QQI#:[id number]  Patient Care Team: Casilda Carls, MD as PCP - General (Internal Medicine)  CHIEF COMPLAINT:  Chief Complaint  Patient presents with  . thrombocytopenia    INTERVAL HISTORY: Patient returns to clinic today for repeat laboratory work and further evaluation. He currently feels well and is asymptomatic. He denies any further bleeding, but continues to have easy bruising. He has no neurologic complaints. He denies any fevers. He has a good appetite and denies weight loss. He denies any chest pain or shortness of breath. He denies any nausea, vomiting, constipation, or diarrhea. He has no urinary complaints. Patient otherwise feels well and offers no further specific complaints.   REVIEW OF SYSTEMS:   Review of Systems  Constitutional: Negative for fever, weight loss and malaise/fatigue.  Cardiovascular: Negative.   Gastrointestinal: Negative.  Negative for blood in stool and melena.  Genitourinary: Negative.   Musculoskeletal: Negative.   Neurological: Negative.  Negative for weakness.  Endo/Heme/Allergies: Bruises/bleeds easily.    As per HPI. Otherwise, a complete review of systems is negatve.  PAST MEDICAL HISTORY: Past Medical History  Diagnosis Date  . Hyperlipidemia   . Hypertension   . Hypothyroidism   . Type 2 diabetes mellitus ([place])   . Renal agenesis     discovered at age 10    PAST SURGICAL HISTORY: No past surgical history on file.  FAMILY HISTORY: No reported history of malignancy or chronic disease.      ADVANCED DIRECTIVES:    HEALTH MAINTENANCE: Social History  Substance Use Topics  . Smoking status: Never Smoker   . Smokeless tobacco: Never Used  . Alcohol Use: No     Colonoscopy:  PAP:  Bone density:  Lipid panel:  No Known Allergies  Current Outpatient Prescriptions  Medication Sig  Dispense Refill  . amLODipine (NORVASC) 5 MG tablet Take 5 mg by mouth.    . calcitRIOL (ROCALTROL) 0.25 MCG capsule Take 0.25 mcg by mouth daily.    . cholecalciferol (VITAMIN D) 1000 units tablet Take 1,000 Units by mouth daily.    . ferrous sulfate 325 (65 FE) MG tablet Take 325 mg by mouth.    . levothyroxine (SYNTHROID, LEVOTHROID) 125 MCG tablet     . lisinopril (PRINIVIL,ZESTRIL) 20 MG tablet Take 20 mg by mouth.    . Omega-3 1000 MG CAPS Take by mouth.    . predniSONE (DELTASONE) 20 MG tablet Take 4 tablets (80 mg total) by mouth daily. 60 tablet 2  . sodium bicarbonate 650 MG tablet      No current facility-administered medications for this visit.    OBJECTIVE: Filed Vitals:   08/02/15 1347  BP: 165/78  Pulse: 64  Temp: 97.7 F (36.5 C)     Body mass index is 29.14 kg/(m^2).    ECOG FS:0 - Asymptomatic  General: Well-developed, well-nourished, no acute distress. Eyes: Pink conjunctiva, anicteric sclera. Lungs: Clear to auscultation bilaterally. Heart: Regular rate and rhythm. No rubs, murmurs, or gallops. Abdomen: Soft, nontender, nondistended. No organomegaly noted, normoactive bowel sounds. Musculoskeletal: No edema, cyanosis, or clubbing. Neuro: Alert, answering all questions appropriately. Cranial nerves grossly intact. Skin: No rashes or petechiae noted. Mild bruising noted on extremities. Psych: Normal affect. Lymphatics: No cervical, calvicular, axillary or inguinal LAD.   LAB RESULTS:  Lab Results  Component Value Date   NA 138 07/27/2015   K 5.1 07/27/2015   CL  110 07/27/2015   CO2 15* 07/27/2015   GLUCOSE 293* 07/27/2015   BUN 55* 07/27/2015   CREATININE 3.29* 07/27/2015   CALCIUM 9.4 07/27/2015   PROT 7.5 07/24/2015   ALBUMIN 3.8 07/24/2015   AST 22 07/24/2015   ALT 19 07/24/2015   ALKPHOS 148* 07/24/2015   BILITOT 0.7 07/24/2015   GFRNONAA 16* 07/27/2015   GFRAA 19* 07/27/2015    Lab Results  Component Value Date   WBC 7.8 08/04/2015    NEUTROABS 11.1* 08/02/2015   HGB 8.0* 08/04/2015   HCT 23.2* 08/04/2015   MCV 97.4 08/04/2015   PLT 72* 08/04/2015     STUDIES: Ct Biopsy  07/25/2015  CLINICAL DATA:  82 year old male with thrombocytopenia EXAM: CT-GUIDED BIOPSY BONE MARROW MEDICATIONS AND MEDICAL HISTORY: Versed 1.0 mg, Fentanyl 0 mcg. Additional Medications: None. ANESTHESIA/SEDATION: None PROCEDURE: The procedure risks, benefits, and alternatives were explained to the patient. Questions regarding the procedure were encouraged and answered. The patient understands and consents to the procedure. Scout CT of the pelvis was performed for surgical planning purposes. The posterior pelvis was prepped with Betadinein a sterile fashion, and a sterile drape was applied covering the operative field. A sterile gown and sterile gloves were used for the procedure. Local anesthesia was provided with 1% Lidocaine. We targeted the left posterior iliac bone for biopsy. The skin and subcutaneous tissues were infiltrated with 1% lidocaine without epinephrine. A small stab incision was made with an 11 blade scalpel, and an 11 gauge Murphy needle was advanced with CT guidance to the posterior cortex. Manual forced was used to advance the needle through the posterior cortex and the stylet was removed. A bone marrow aspirate was retrieved and passed to a cytotechnologist in the room. The Murphy needle was then advanced without the stylet for a core biopsy. The core biopsy was retrieved and also passed to a cytotechnologist. Manual pressure was used for hemostasis and a sterile dressing was placed. No complications were encountered no significant blood loss was encountered. Patient tolerated the procedure well and remained hemodynamically stable throughout. FINDINGS: Scout image demonstrates safe approach to posterior iliac bone. Images during the case demonstrate placement of 11 gauge Murphy needle COMPLICATIONS: None IMPRESSION: Status post CT-guided bone  marrow biopsy, with tissue specimen sent to pathology for complete histopathologic analysis Signed, Dulcy Fanny. Earleen Newport, DO [facility] Radiology Electronically Signed   By: Corrie Mckusick D.O.   On: 07/25/2015 17:08   Dg Chest Port 1 View  08/03/2015  CLINICAL DATA:  Bedside PICC placement. Recent diagnosis of idiopathic thrombocytopenic purpura. EXAM: PORTABLE CHEST 1 VIEW COMPARISON:  12/13/2010. FINDINGS: Right arm PICC tip projects over the mid SVC. Suboptimal inspiration with mild bibasilar atelectasis. Lungs otherwise clear. Cardiac silhouette mildly enlarged, unchanged. Pulmonary vascularity normal. No pleural effusions. IMPRESSION: 1. Right arm PICC tip projects over the mid SVC. 2. Stable mild cardiomegaly. Suboptimal inspiration accounts for mild bibasilar atelectasis. No acute cardiopulmonary disease otherwise. Electronically Signed   By: Evangeline Dakin M.D.   On: 08/03/2015 15:00    ASSESSMENT:  Bone marrow biopsy confirmed ITP.  PLAN:    1. ITP: Bone marrow biopsy revealed normal megakaryocytes as well as normal trilineage hematopoiesis thus suggesting peripheral destruction of his platelets.  Infectious workup did not reveal an underlying viral etiology. Antiplatelet antibodies are pending at time of dictation. Patient had minimal response to platelet transfusion, but will proceed with 2 units of platelets tomorrow. Patient will also initiate cycle 1 of  4 of weekly Rituxan 375 mg/m tomorrow. Continue 80 mg prednisone daily, which we will begin to taper in the next 1-2 weeks. Finally, will also get a noncontrast CT scan to assess for an underlying lymphoma as a possible etiology of his ITP. Return to clinic on Monday, August 06, 2015 for repeat laboratory work and further evaluation. 2. Chronic renal insufficiency: Patient's creatinine is approximately his baseline. Continue follow-up with nephrology as scheduled. Patient will require  noncontrast CT as above. 3. Hyperglycemia: Secondary to high dose prednisone. Monitor. Taper prednisone as above.   Patient expressed understanding and was in agreement with this plan. He also understands that He can call clinic at any time with any questions, concerns, or complaints.   [name], MD   08/05/2015 8:06 AM

## 2015-08-05 NOTE — ED Notes (Signed)
Critical troponin of 0.05, glucose of 518 called from lab. Dr. Cinda Quest notified, no new orders received.

## 2015-08-05 NOTE — ED Notes (Signed)
Pt resting, states nausea gone. resps unlabored at this time. Family at bedside and updated on treatment process.

## 2015-08-06 ENCOUNTER — Inpatient Hospital Stay: Payer: Medicare HMO

## 2015-08-06 ENCOUNTER — Telehealth: Payer: Self-pay | Admitting: *Deleted

## 2015-08-06 ENCOUNTER — Encounter: Payer: Self-pay | Admitting: Internal Medicine

## 2015-08-06 ENCOUNTER — Emergency Department: Payer: Medicare HMO

## 2015-08-06 ENCOUNTER — Inpatient Hospital Stay: Payer: Medicare HMO | Admitting: Oncology

## 2015-08-06 DIAGNOSIS — I10 Essential (primary) hypertension: Secondary | ICD-10-CM

## 2015-08-06 DIAGNOSIS — N390 Urinary tract infection, site not specified: Secondary | ICD-10-CM | POA: Diagnosis present

## 2015-08-06 DIAGNOSIS — D693 Immune thrombocytopenic purpura: Secondary | ICD-10-CM | POA: Diagnosis present

## 2015-08-06 DIAGNOSIS — E872 Acidosis: Secondary | ICD-10-CM | POA: Diagnosis present

## 2015-08-06 DIAGNOSIS — E785 Hyperlipidemia, unspecified: Secondary | ICD-10-CM | POA: Diagnosis present

## 2015-08-06 DIAGNOSIS — N179 Acute kidney failure, unspecified: Secondary | ICD-10-CM | POA: Diagnosis present

## 2015-08-06 DIAGNOSIS — T380X5A Adverse effect of glucocorticoids and synthetic analogues, initial encounter: Secondary | ICD-10-CM | POA: Diagnosis present

## 2015-08-06 DIAGNOSIS — Z79899 Other long term (current) drug therapy: Secondary | ICD-10-CM | POA: Diagnosis not present

## 2015-08-06 DIAGNOSIS — E1165 Type 2 diabetes mellitus with hyperglycemia: Secondary | ICD-10-CM | POA: Diagnosis present

## 2015-08-06 DIAGNOSIS — Q602 Renal agenesis, unspecified: Secondary | ICD-10-CM

## 2015-08-06 DIAGNOSIS — E1122 Type 2 diabetes mellitus with diabetic chronic kidney disease: Secondary | ICD-10-CM | POA: Diagnosis present

## 2015-08-06 DIAGNOSIS — N289 Disorder of kidney and ureter, unspecified: Secondary | ICD-10-CM

## 2015-08-06 DIAGNOSIS — Z8049 Family history of malignant neoplasm of other genital organs: Secondary | ICD-10-CM | POA: Diagnosis not present

## 2015-08-06 DIAGNOSIS — E860 Dehydration: Secondary | ICD-10-CM | POA: Diagnosis present

## 2015-08-06 DIAGNOSIS — D649 Anemia, unspecified: Secondary | ICD-10-CM

## 2015-08-06 DIAGNOSIS — N184 Chronic kidney disease, stage 4 (severe): Secondary | ICD-10-CM | POA: Diagnosis present

## 2015-08-06 DIAGNOSIS — N2581 Secondary hyperparathyroidism of renal origin: Secondary | ICD-10-CM | POA: Diagnosis present

## 2015-08-06 DIAGNOSIS — D631 Anemia in chronic kidney disease: Secondary | ICD-10-CM | POA: Diagnosis present

## 2015-08-06 DIAGNOSIS — E039 Hypothyroidism, unspecified: Secondary | ICD-10-CM | POA: Diagnosis present

## 2015-08-06 DIAGNOSIS — M549 Dorsalgia, unspecified: Secondary | ICD-10-CM

## 2015-08-06 DIAGNOSIS — E875 Hyperkalemia: Secondary | ICD-10-CM | POA: Diagnosis present

## 2015-08-06 DIAGNOSIS — Z87440 Personal history of urinary (tract) infections: Secondary | ICD-10-CM | POA: Diagnosis not present

## 2015-08-06 DIAGNOSIS — R197 Diarrhea, unspecified: Secondary | ICD-10-CM | POA: Diagnosis present

## 2015-08-06 DIAGNOSIS — N400 Enlarged prostate without lower urinary tract symptoms: Secondary | ICD-10-CM | POA: Diagnosis present

## 2015-08-06 LAB — URINALYSIS COMPLETE WITH MICROSCOPIC (ARMC ONLY)
BACTERIA UA: NONE SEEN
BILIRUBIN URINE: NEGATIVE
Glucose, UA: >500 mg/dL — AB
Ketones, ur: NEGATIVE mg/dL
Nitrite: NEGATIVE
PH: 6 (ref 5.0–8.0)
PROTEIN: 100 mg/dL — AB
SQUAMOUS EPITHELIAL / LPF: NONE SEEN
Specific Gravity, Urine: 1.014 (ref 1.005–1.030)

## 2015-08-06 LAB — GLUCOSE, CAPILLARY
GLUCOSE-CAPILLARY: 202 mg/dL — AB (ref 65–99)
Glucose-Capillary: 266 mg/dL — ABNORMAL HIGH (ref 65–99)
Glucose-Capillary: 277 mg/dL — ABNORMAL HIGH (ref 65–99)
Glucose-Capillary: 196 mg/dL — ABNORMAL HIGH (ref 65–99)

## 2015-08-06 LAB — CBC
HCT: 28.7 % — ABNORMAL LOW (ref 40.0–52.0)
Hemoglobin: 9.8 g/dL — ABNORMAL LOW (ref 13.0–18.0)
MCH: 33.4 pg (ref 26.0–34.0)
MCHC: 34.1 g/dL (ref 32.0–36.0)
MCV: 98 fL (ref 80.0–100.0)
PLATELETS: 45 10*3/uL — AB (ref 150–440)
RBC: 2.93 MIL/uL — ABNORMAL LOW (ref 4.40–5.90)
RDW: 15.5 % — AB (ref 11.5–14.5)
WBC: 15.1 10*3/uL — AB (ref 3.8–10.6)

## 2015-08-06 LAB — BASIC METABOLIC PANEL
Anion gap: 9 (ref 5–15)
BUN: 69 mg/dL — AB (ref 6–20)
CALCIUM: 7.8 mg/dL — AB (ref 8.9–10.3)
CO2: 18 mmol/L — ABNORMAL LOW (ref 22–32)
Chloride: 111 mmol/L (ref 101–111)
Creatinine, Ser: 2.79 mg/dL — ABNORMAL HIGH (ref 0.61–1.24)
GFR calc Af Amer: 23 mL/min — ABNORMAL LOW (ref >60)
GFR, EST NON AFRICAN AMERICAN: 20 mL/min — AB (ref >60)
GLUCOSE: 315 mg/dL — AB (ref 65–99)
POTASSIUM: 5.6 mmol/L — AB (ref 3.5–5.1)
SODIUM: 138 mmol/L (ref 135–145)

## 2015-08-06 LAB — C DIFFICILE QUICK SCREEN W PCR REFLEX
C DIFFICILE (CDIFF) INTERP: NEGATIVE
C DIFFICILE (CDIFF) TOXIN: NEGATIVE
C DIFFICLE (CDIFF) ANTIGEN: NEGATIVE

## 2015-08-06 LAB — POTASSIUM: Potassium: 5.9 mmol/L — ABNORMAL HIGH (ref 3.5–5.1)

## 2015-08-06 LAB — TROPONIN I

## 2015-08-06 MED ORDER — INSULIN ASPART 100 UNIT/ML ~~LOC~~ SOLN: 0.0000 [IU] | Freq: Every day | SUBCUTANEOUS | Status: DC

## 2015-08-06 MED ORDER — CALCITRIOL 0.25 MCG PO CAPS
0.2500 ug | ORAL_CAPSULE | Freq: Every day | ORAL | Status: DC
  Administered 2015-08-06 – 2015-08-08 (×3): 0.25 ug via ORAL
  Filled 2015-08-06 (×3): qty 1

## 2015-08-06 MED ORDER — SODIUM CHLORIDE 0.9 % IV SOLN
INTRAVENOUS | Status: DC
  Administered 2015-08-06 – 2015-08-08 (×5): via INTRAVENOUS

## 2015-08-06 MED ORDER — PATIROMER SORBITEX CALCIUM 8.4 G PO PACK
8.4000 g | PACK | Freq: Every day | ORAL | Status: DC
  Administered 2015-08-06 – 2015-08-08 (×3): 8.4 g via ORAL
  Filled 2015-08-06 (×3): qty 4

## 2015-08-06 MED ORDER — MORPHINE SULFATE (PF) 2 MG/ML IV SOLN: 2.0000 mg | INTRAVENOUS | Status: DC | PRN

## 2015-08-06 MED ORDER — PREDNISONE 20 MG PO TABS
80.0000 mg | ORAL_TABLET | Freq: Every day | ORAL | Status: DC
  Administered 2015-08-06: 80 mg via ORAL
  Filled 2015-08-06: qty 4

## 2015-08-06 MED ORDER — ALBUTEROL SULFATE (2.5 MG/3ML) 0.083% IN NEBU: 2.5000 mg | INHALATION_SOLUTION | RESPIRATORY_TRACT | Status: DC | PRN

## 2015-08-06 MED ORDER — INSULIN ASPART 100 UNIT/ML ~~LOC~~ SOLN
0.0000 [IU] | Freq: Every day | SUBCUTANEOUS | Status: DC
Start: 2015-08-06 — End: 2015-08-08

## 2015-08-06 MED ORDER — OXYCODONE HCL 5 MG PO TABS: 5.0000 mg | ORAL_TABLET | ORAL | Status: DC | PRN

## 2015-08-06 MED ORDER — ACETAMINOPHEN 650 MG RE SUPP: 650.0000 mg | Freq: Four times a day (QID) | RECTAL | Status: DC | PRN

## 2015-08-06 MED ORDER — INSULIN STARTER KIT- PEN NEEDLES (ENGLISH)
1.0000 | Freq: Once | Status: DC
  Filled 2015-08-06: qty 1

## 2015-08-06 MED ORDER — INSULIN ASPART 100 UNIT/ML ~~LOC~~ SOLN
0.0000 [IU] | Freq: Three times a day (TID) | SUBCUTANEOUS | Status: DC
  Administered 2015-08-06: 3 [IU] via SUBCUTANEOUS
  Administered 2015-08-07: 7 [IU] via SUBCUTANEOUS
  Administered 2015-08-07: 2 [IU] via SUBCUTANEOUS
  Administered 2015-08-07: 3 [IU] via SUBCUTANEOUS
  Administered 2015-08-08: 1 [IU] via SUBCUTANEOUS
  Filled 2015-08-06: qty 3
  Filled 2015-08-06: qty 7
  Filled 2015-08-06: qty 3
  Filled 2015-08-06: qty 2
  Filled 2015-08-06: qty 1

## 2015-08-06 MED ORDER — BISACODYL 5 MG PO TBEC
5.0000 mg | DELAYED_RELEASE_TABLET | Freq: Every day | ORAL | Status: DC | PRN
Start: 2015-08-06 — End: 2015-08-08
  Filled 2015-08-06: qty 1

## 2015-08-06 MED ORDER — LIVING WELL WITH DIABETES BOOK
Freq: Once | Status: AC
  Administered 2015-08-06: 16:00:00
  Filled 2015-08-06: qty 1

## 2015-08-06 MED ORDER — POLYETHYLENE GLYCOL 3350 17 G PO PACK: 17.0000 g | PACK | Freq: Every day | ORAL | Status: DC | PRN

## 2015-08-06 MED ORDER — LEVOTHYROXINE SODIUM 125 MCG PO TABS
125.0000 ug | ORAL_TABLET | Freq: Every day | ORAL | Status: DC
  Administered 2015-08-06 – 2015-08-08 (×3): 125 ug via ORAL
  Filled 2015-08-06 (×3): qty 1

## 2015-08-06 MED ORDER — DEXTROSE 5 % IV SOLN
1.0000 g | INTRAVENOUS | Status: DC
  Administered 2015-08-06 – 2015-08-07 (×2): 1 g via INTRAVENOUS
  Filled 2015-08-06 (×3): qty 10

## 2015-08-06 MED ORDER — ACETAMINOPHEN 325 MG PO TABS: 650.0000 mg | ORAL_TABLET | Freq: Four times a day (QID) | ORAL | Status: DC | PRN

## 2015-08-06 MED ORDER — SODIUM BICARBONATE 650 MG PO TABS
1300.0000 mg | ORAL_TABLET | Freq: Two times a day (BID) | ORAL | Status: DC
  Administered 2015-08-06 – 2015-08-08 (×5): 1300 mg via ORAL
  Filled 2015-08-06 (×6): qty 2

## 2015-08-06 MED ORDER — ONDANSETRON HCL 4 MG PO TABS: 4.0000 mg | ORAL_TABLET | Freq: Four times a day (QID) | ORAL | Status: DC | PRN

## 2015-08-06 MED ORDER — AMLODIPINE BESYLATE 5 MG PO TABS
5.0000 mg | ORAL_TABLET | Freq: Every day | ORAL | Status: DC
  Administered 2015-08-06 – 2015-08-08 (×3): 5 mg via ORAL
  Filled 2015-08-06 (×3): qty 1

## 2015-08-06 MED ORDER — INSULIN ASPART 100 UNIT/ML ~~LOC~~ SOLN
0.0000 [IU] | Freq: Three times a day (TID) | SUBCUTANEOUS | Status: DC
  Administered 2015-08-06 (×2): 5 [IU] via SUBCUTANEOUS
  Filled 2015-08-06 (×2): qty 5

## 2015-08-06 MED ORDER — PREDNISONE 20 MG PO TABS
40.0000 mg | ORAL_TABLET | Freq: Every day | ORAL | Status: DC
  Administered 2015-08-07 – 2015-08-08 (×2): 40 mg via ORAL
  Filled 2015-08-06 (×2): qty 2

## 2015-08-06 MED ORDER — ONDANSETRON HCL 4 MG/2ML IJ SOLN: 4.0000 mg | Freq: Four times a day (QID) | INTRAMUSCULAR | Status: DC | PRN

## 2015-08-06 MED ORDER — PREDNISONE 20 MG PO TABS: 80.0000 mg | ORAL_TABLET | Freq: Every day | ORAL | Status: DC

## 2015-08-06 NOTE — Progress Notes (Signed)
Spoke with Dr Anselm Jungling about sliding scale being too high for pt not eating and fsbs 202 ,  order received to change to lower dose of sliding scale

## 2015-08-06 NOTE — ED Notes (Signed)
Pt assisted with bedpan for liquid bowel movement, dark brown.

## 2015-08-06 NOTE — Progress Notes (Addendum)
Paged MD, notified of abnormal lab results. Pt CBG this am is 266, order for SSI but pt not eating, nauseous. Per Dr. Marthann Schiller, give 5 units SSI since pt not eating. New orders for recheck labs, will continue to monitor. New order for Diabetes Mgmt Consult and Nephrology Consult.

## 2015-08-06 NOTE — Telephone Encounter (Signed)
Patient is scheduled for CT tomorrow as outpatient CAP with contrast, but he had a non contrast CAP done as inpatietn yesterday, so does he still need to come for tomorrows appt?

## 2015-08-06 NOTE — Progress Notes (Signed)
Dr. Estanislado Pandy notified of potassium. No orders placed at this time. Will continue to monitor.

## 2015-08-06 NOTE — ED Notes (Signed)
Pharmacy notified regarding need for rocephin.

## 2015-08-06 NOTE — NC FL2 (Signed)
[place] LEVEL OF CARE SCREENING TOOL     IDENTIFICATION  Patient Name: Alejandro Armstrong Birthdate: 12-09-1929 Sex: male Admission Date (Current Location): 08/05/2015  [place] and [place] Number:  Engineering geologist and Address:  [facility], [address] [place], [place] [address]      Provider Number: [id number]  Attending Physician Name and Address:  Vaughan Basta, MD  Relative Name and Phone Number:       Current Level of Care: Hospital Recommended Level of Care: [place] Prior Approval Number:    Date Approved/Denied:   PASRR Number:  [id number])  Discharge Plan: SNF    Current Diagnoses: Patient Active Problem List   Diagnosis Date Noted  . UTI (lower urinary tract infection) 08/06/2015  . ITP (idiopathic thrombocytopenic purpura) 08/02/2015  . Thrombocytopenia ([place]) 07/24/2015    Orientation RESPIRATION BLADDER Height & Weight     Self, Time, Situation, Place  Normal Incontinent Weight: 185 lb 3.2 oz (84.006 kg) Height:  _0  (170.2 cm)  BEHAVIORAL SYMPTOMS/MOOD NEUROLOGICAL BOWEL NUTRITION STATUS   (none )  (none ) Incontinent Diet (Renal diet/ Fluid restritions )  AMBULATORY STATUS COMMUNICATION OF NEEDS Skin   Extensive Assist Verbally Surgical wounds (Incision: Left Sacrum)                       Personal Care Assistance Level of Assistance  Bathing, Feeding, Dressing Bathing Assistance: Limited assistance Feeding assistance: Independent Dressing Assistance: Limited assistance     Functional Limitations Info  Sight, Hearing, Speech Sight Info: Adequate Hearing Info: Impaired Speech Info: Adequate    SPECIAL CARE FACTORS FREQUENCY  PT (By licensed PT), OT (By licensed OT)     PT Frequency:  (5) OT Frequency:  (5)            Contractures      Additional Factors Info                  Current Medications (08/06/2015):  This is the current hospital  active medication list Current Facility-Administered Medications  Medication Dose Route Frequency Provider Last Rate Last Dose  . 0.9 %  sodium chloride infusion   Intravenous Continuous Hillary Bow, MD 100 mL/hr at 08/06/15 1557    . acetaminophen (TYLENOL) tablet 650 mg  650 mg Oral Q6H PRN Hillary Bow, MD       Or  . acetaminophen (TYLENOL) suppository 650 mg  650 mg Rectal Q6H PRN Srikar Sudini, MD      . albuterol (PROVENTIL) (2.5 MG/3ML) 0.083% nebulizer solution 2.5 mg  2.5 mg Nebulization Q2H PRN Srikar Sudini, MD      . amLODipine (NORVASC) tablet 5 mg  5 mg Oral Daily Hillary Bow, MD   5 mg at 08/06/15 0819  . bisacodyl (DULCOLAX) EC tablet 5 mg  5 mg Oral Daily PRN Srikar Sudini, MD      . calcitRIOL (ROCALTROL) capsule 0.25 mcg  0.25 mcg Oral Daily Hillary Bow, MD   0.25 mcg at 08/06/15 0819  . cefTRIAXone (ROCEPHIN) 1 g in dextrose 5 % 50 mL IVPB  1 g Intravenous Q24H Hillary Bow, MD   1 g at 08/06/15 0315  . insulin aspart (novoLOG) injection 0-20 Units  0-20 Units Subcutaneous TID WC Hillary Bow, MD   5 Units at 08/06/15 1303  . insulin aspart (novoLOG) injection 0-5 Units  0-5 Units Subcutaneous QHS Hillary Bow, MD   0 Units at 08/06/15  0145  . insulin starter kit- pen needles (English) 1 kit  1 kit Other Once Vaughan Basta, MD      . iohexol (OMNIPAQUE) 240 MG/ML injection 25 mL  25 mL Oral Once PRN Carrie Mew, MD      . levothyroxine (SYNTHROID, LEVOTHROID) tablet 125 mcg  125 mcg Oral QAC breakfast Hillary Bow, MD   125 mcg at 08/06/15 0819  . morphine 2 MG/ML injection 2 mg  2 mg Intravenous Q4H PRN Srikar Sudini, MD      . ondansetron (ZOFRAN) tablet 4 mg  4 mg Oral Q6H PRN Hillary Bow, MD       Or  . ondansetron (ZOFRAN) injection 4 mg  4 mg Intravenous Q6H PRN Srikar Sudini, MD      . oxyCODONE (Oxy IR/ROXICODONE) immediate release tablet 5 mg  5 mg Oral Q4H PRN Hillary Bow, MD      . patiromer Daryll Drown) packet 8.4 g  8.4 g Oral Daily  Harmeet Singh, MD   8.4 g at 08/06/15 1558  . polyethylene glycol (MIRALAX / GLYCOLAX) packet 17 g  17 g Oral Daily PRN Hillary Bow, MD      . Derrill Memo ON 08/07/2015] predniSONE (DELTASONE) tablet 40 mg  40 mg Oral Q breakfast [name], MD      . sodium bicarbonate tablet 1,300 mg  1,300 mg Oral BID Hillary Bow, MD   1,300 mg at 08/06/15 1607     Discharge Medications: Please see discharge summary for a list of discharge medications.  Relevant Imaging Results:  Relevant Lab Results:   Additional Information  (SSN: [id number])  Loralyn Freshwater, LCSW

## 2015-08-06 NOTE — ED Notes (Signed)
Patient transported to CT 

## 2015-08-06 NOTE — Consult Note (Signed)
[place]  [phone] [phone]  ID: Alejandro Armstrong OB: 1930/02/14  MR#: [id number]  HWE#:[id number]  Patient Care Team: Casilda Carls, MD as PCP - General (Internal Medicine)  CHIEF COMPLAINT: ITP, hyperglycemia, UTI.  INTERVAL HISTORY: Patient is an 82 year old male who was recently diagnosed with ITP. He was initiated on high-dose prednisone and received his first infusion of IV Rituxan this past Friday. His platelet count improved and he was discharged on Saturday morning. Saturday afternoon patient stated he started feeling "bad" with increased nausea and vomiting. He will return to the ER and was found to have a significantly elevated blood glucose as well as a UTI. He currently feels improved since admission. He otherwise feels well and asymptomatic. He denies any fevers. He has back pain, but this is chronic in nature. He denies any weight loss. He has no night sweats. He has no neurologic complaints. He denies any chest pain, shortness of breath, cough, or hemoptysis. He has no urinary complaints. Patient otherwise feels well and offers no further specific complaints.  REVIEW OF SYSTEMS:   Review of Systems  Constitutional: Positive for malaise/fatigue. Negative for fever, weight loss and diaphoresis.  Respiratory: Negative.  Negative for cough, hemoptysis and shortness of breath.   Cardiovascular: Negative.  Negative for chest pain.  Gastrointestinal: Positive for nausea.  Genitourinary: Negative.  Negative for hematuria.  Musculoskeletal: Positive for back pain.  Neurological: Positive for weakness.  Endo/Heme/Allergies: Bruises/bleeds easily.    As per HPI. Otherwise, a complete review of systems is negatve.  PAST MEDICAL HISTORY: Past Medical History  Diagnosis Date  . Hyperlipidemia   . Hypertension   . Hypothyroidism   . Type 2 diabetes mellitus ([place])   . Renal agenesis     discovered at age 45  . Recurrent UTI   .  Ureteral stricture     PAST SURGICAL HISTORY: History reviewed. No pertinent past surgical history.  FAMILY HISTORY Family History  Problem Relation Age of Onset  . Cervical cancer Sister        ADVANCED DIRECTIVES:    HEALTH MAINTENANCE: Social History  Substance Use Topics  . Smoking status: Never Smoker   . Smokeless tobacco: Never Used  . Alcohol Use: No     Colonoscopy:  PAP:  Bone density:  Lipid panel:  No Known Allergies  Current Facility-Administered Medications  Medication Dose Route Frequency Provider Last Rate Last Dose  . 0.9 %  sodium chloride infusion   Intravenous Continuous Hillary Bow, MD 100 mL/hr at 08/06/15 1557    . acetaminophen (TYLENOL) tablet 650 mg  650 mg Oral Q6H PRN Hillary Bow, MD       Or  . acetaminophen (TYLENOL) suppository 650 mg  650 mg Rectal Q6H PRN Srikar Sudini, MD      . albuterol (PROVENTIL) (2.5 MG/3ML) 0.083% nebulizer solution 2.5 mg  2.5 mg Nebulization Q2H PRN Srikar Sudini, MD      . amLODipine (NORVASC) tablet 5 mg  5 mg Oral Daily Hillary Bow, MD   5 mg at 08/06/15 0819  . bisacodyl (DULCOLAX) EC tablet 5 mg  5 mg Oral Daily PRN Srikar Sudini, MD      . calcitRIOL (ROCALTROL) capsule 0.25 mcg  0.25 mcg Oral Daily Hillary Bow, MD   0.25 mcg at 08/06/15 0819  . cefTRIAXone (ROCEPHIN) 1 g in dextrose 5 % 50 mL IVPB  1 g Intravenous Q24H Hillary Bow, MD   1 g at 08/06/15 0315  .  insulin aspart (novoLOG) injection 0-5 Units  0-5 Units Subcutaneous QHS Vaughan Basta, MD      . insulin aspart (novoLOG) injection 0-9 Units  0-9 Units Subcutaneous TID WC Vaughan Basta, MD      . insulin starter kit- pen needles (English) 1 kit  1 kit Other Once Vaughan Basta, MD      . iohexol (OMNIPAQUE) 240 MG/ML injection 25 mL  25 mL Oral Once PRN Carrie Mew, MD      . levothyroxine (SYNTHROID, LEVOTHROID) tablet 125 mcg  125 mcg Oral QAC breakfast Hillary Bow, MD   125 mcg at 08/06/15 2440  .  morphine 2 MG/ML injection 2 mg  2 mg Intravenous Q4H PRN Srikar Sudini, MD      . ondansetron (ZOFRAN) tablet 4 mg  4 mg Oral Q6H PRN Hillary Bow, MD       Or  . ondansetron (ZOFRAN) injection 4 mg  4 mg Intravenous Q6H PRN Srikar Sudini, MD      . oxyCODONE (Oxy IR/ROXICODONE) immediate release tablet 5 mg  5 mg Oral Q4H PRN Hillary Bow, MD      . patiromer Daryll Drown) packet 8.4 g  8.4 g Oral Daily Harmeet Singh, MD   8.4 g at 08/06/15 1558  . polyethylene glycol (MIRALAX / GLYCOLAX) packet 17 g  17 g Oral Daily PRN Hillary Bow, MD      . Derrill Memo ON 08/07/2015] predniSONE (DELTASONE) tablet 40 mg  40 mg Oral Q breakfast [name], MD      . sodium bicarbonate tablet 1,300 mg  1,300 mg Oral BID Hillary Bow, MD   1,300 mg at 08/06/15 0819    OBJECTIVE: Filed Vitals:   08/06/15 0300 08/06/15 0822  BP: 163/78 142/88  Pulse: 96 78  Temp: 99.5 F (37.5 C) 97.9 F (36.6 C)  Resp: 19 17     Body mass index is 29 kg/(m^2).    ECOG FS:0 - Asymptomatic  General: Well-developed, well-nourished, no acute distress. Eyes: Pink conjunctiva, anicteric sclera. HEENT: Normocephalic, moist mucous membranes, clear oropharnyx. Lungs: Clear to auscultation bilaterally. Heart: Regular rate and rhythm. No rubs, murmurs, or gallops. Abdomen: Soft, nontender, nondistended. No organomegaly noted, normoactive bowel sounds. Musculoskeletal: No edema, cyanosis, or clubbing. Neuro: Alert, answering all questions appropriately. Cranial nerves grossly intact. Skin: ecchymosis noted on all extremities. Psych: Normal affect. Lymphatics: No cervical, calvicular, axillary or inguinal LAD.   LAB RESULTS:  Lab Results  Component Value Date   NA 138 08/06/2015   K 5.9* 08/06/2015   CL 111 08/06/2015   CO2 18* 08/06/2015   GLUCOSE 315* 08/06/2015   BUN 69* 08/06/2015   CREATININE 2.79* 08/06/2015   CALCIUM 7.8* 08/06/2015   PROT 6.5 08/05/2015   ALBUMIN 3.5 08/05/2015   AST 27 08/05/2015    ALT 25 08/05/2015   ALKPHOS 121 08/05/2015   BILITOT 0.9 08/05/2015   GFRNONAA 20* 08/06/2015   GFRAA 23* 08/06/2015    Lab Results  Component Value Date   WBC 15.1* 08/06/2015   NEUTROABS 11.1* 08/02/2015   HGB 9.8* 08/06/2015   HCT 28.7* 08/06/2015   MCV 98.0 08/06/2015   PLT 45* 08/06/2015     STUDIES: Ct Abdomen Pelvis Wo Contrast  08/06/2015  CLINICAL DATA:  Acute onset of vomiting and tachycardia. Idiopathic thrombocytopenic purpura. Initial encounter. EXAM: CT CHEST, ABDOMEN AND PELVIS WITHOUT CONTRAST TECHNIQUE: Multidetector CT imaging of the chest, abdomen and pelvis was performed following the standard protocol without IV contrast. COMPARISON:  Chest radiograph performed 08/03/2015 FINDINGS: CT CHEST Mild atelectasis or scarring is noted at the left lung base. The lungs are otherwise clear. There is no evidence of focal consolidation, pleural effusion or pneumothorax. No masses are identified. Diffuse coronary artery calcifications are seen. A small to moderate hiatal hernia is noted. No mediastinal lymphadenopathy is seen. No pericardial effusion is identified. The great vessels are grossly unremarkable. A right-sided PICC is noted ending about the distal SVC. The thyroid gland is diminutive and grossly unremarkable. No axillary lymphadenopathy is seen. No acute osseous abnormalities are identified. CT ABDOMEN AND PELVIS The liver and spleen are unremarkable in appearance. The gallbladder is within normal limits. The pancreas and adrenal glands are unremarkable. Left-sided renal atrophy is noted, with likely left-sided renal cysts and scattered parenchymal calcifications at the lower pole of the left kidney. The right kidney is markedly atrophic. There is no evidence of hydronephrosis. No renal or ureteral stones are identified. No free fluid is identified. The small bowel is unremarkable in appearance. The stomach is otherwise within normal limits. No acute vascular abnormalities are  seen. Scattered calcification is noted along the abdominal aorta. The appendix is not definitely characterized; there is no evidence of appendicitis. The colon is unremarkable in appearance. The bladder is mildly distended and grossly unremarkable. The prostate remains normal in size. No inguinal lymphadenopathy is seen. No acute osseous abnormalities are identified. There is chronic loss of height involving vertebral body L1. IMPRESSION: 1. No acute abnormality seen within the abdomen or pelvis. 2. Mild left basilar atelectasis or scarring. Lungs otherwise clear. 3. Diffuse coronary artery calcifications seen. 4. Small to moderate hiatal hernia noted. 5. Left-sided renal atrophy, with likely left-sided renal cysts and parenchymal calcifications at the lower pole of the left kidney. Marked right renal atrophy. 6. Scattered calcification along the abdominal aorta. 7. Chronic loss of height involving vertebral body L1. Electronically Signed   By: Garald Balding M.D.   On: 08/06/2015 01:09   Ct Chest Wo Contrast  08/06/2015  CLINICAL DATA:  Acute onset of vomiting and tachycardia. Idiopathic thrombocytopenic purpura. Initial encounter. EXAM: CT CHEST, ABDOMEN AND PELVIS WITHOUT CONTRAST TECHNIQUE: Multidetector CT imaging of the chest, abdomen and pelvis was performed following the standard protocol without IV contrast. COMPARISON:  Chest radiograph performed 08/03/2015 FINDINGS: CT CHEST Mild atelectasis or scarring is noted at the left lung base. The lungs are otherwise clear. There is no evidence of focal consolidation, pleural effusion or pneumothorax. No masses are identified. Diffuse coronary artery calcifications are seen. A small to moderate hiatal hernia is noted. No mediastinal lymphadenopathy is seen. No pericardial effusion is identified. The great vessels are grossly unremarkable. A right-sided PICC is noted ending about the distal SVC. The thyroid gland is diminutive and grossly unremarkable. No  axillary lymphadenopathy is seen. No acute osseous abnormalities are identified. CT ABDOMEN AND PELVIS The liver and spleen are unremarkable in appearance. The gallbladder is within normal limits. The pancreas and adrenal glands are unremarkable. Left-sided renal atrophy is noted, with likely left-sided renal cysts and scattered parenchymal calcifications at the lower pole of the left kidney. The right kidney is markedly atrophic. There is no evidence of hydronephrosis. No renal or ureteral stones are identified. No free fluid is identified. The small bowel is unremarkable in appearance. The stomach is otherwise within normal limits. No acute vascular abnormalities are seen. Scattered calcification is noted along the abdominal aorta. The appendix is not definitely characterized; there is no evidence of appendicitis. The  colon is unremarkable in appearance. The bladder is mildly distended and grossly unremarkable. The prostate remains normal in size. No inguinal lymphadenopathy is seen. No acute osseous abnormalities are identified. There is chronic loss of height involving vertebral body L1. IMPRESSION: 1. No acute abnormality seen within the abdomen or pelvis. 2. Mild left basilar atelectasis or scarring. Lungs otherwise clear. 3. Diffuse coronary artery calcifications seen. 4. Small to moderate hiatal hernia noted. 5. Left-sided renal atrophy, with likely left-sided renal cysts and parenchymal calcifications at the lower pole of the left kidney. Marked right renal atrophy. 6. Scattered calcification along the abdominal aorta. 7. Chronic loss of height involving vertebral body L1. Electronically Signed   By: Garald Balding M.D.   On: 08/06/2015 01:09   Ct Biopsy  07/25/2015  CLINICAL DATA:  82 year old male with thrombocytopenia EXAM: CT-GUIDED BIOPSY BONE MARROW MEDICATIONS AND MEDICAL HISTORY: Versed 1.0 mg, Fentanyl 0 mcg. Additional Medications: None. ANESTHESIA/SEDATION: None PROCEDURE: The procedure risks,  benefits, and alternatives were explained to the patient. Questions regarding the procedure were encouraged and answered. The patient understands and consents to the procedure. Scout CT of the pelvis was performed for surgical planning purposes. The posterior pelvis was prepped with Betadinein a sterile fashion, and a sterile drape was applied covering the operative field. A sterile gown and sterile gloves were used for the procedure. Local anesthesia was provided with 1% Lidocaine. We targeted the left posterior iliac bone for biopsy. The skin and subcutaneous tissues were infiltrated with 1% lidocaine without epinephrine. A small stab incision was made with an 11 blade scalpel, and an 11 gauge Murphy needle was advanced with CT guidance to the posterior cortex. Manual forced was used to advance the needle through the posterior cortex and the stylet was removed. A bone marrow aspirate was retrieved and passed to a cytotechnologist in the room. The Murphy needle was then advanced without the stylet for a core biopsy. The core biopsy was retrieved and also passed to a cytotechnologist. Manual pressure was used for hemostasis and a sterile dressing was placed. No complications were encountered no significant blood loss was encountered. Patient tolerated the procedure well and remained hemodynamically stable throughout. FINDINGS: Scout image demonstrates safe approach to posterior iliac bone. Images during the case demonstrate placement of 11 gauge Murphy needle COMPLICATIONS: None IMPRESSION: Status post CT-guided bone marrow biopsy, with tissue specimen sent to pathology for complete histopathologic analysis Signed, Dulcy Fanny. Earleen Newport, DO [facility] Radiology Electronically Signed   By: Corrie Mckusick D.O.   On: 07/25/2015 17:08   Dg Chest Port 1 View  08/03/2015  CLINICAL DATA:  Bedside PICC placement. Recent diagnosis of idiopathic thrombocytopenic purpura. EXAM:  PORTABLE CHEST 1 VIEW COMPARISON:  12/13/2010. FINDINGS: Right arm PICC tip projects over the mid SVC. Suboptimal inspiration with mild bibasilar atelectasis. Lungs otherwise clear. Cardiac silhouette mildly enlarged, unchanged. Pulmonary vascularity normal. No pleural effusions. IMPRESSION: 1. Right arm PICC tip projects over the mid SVC. 2. Stable mild cardiomegaly. Suboptimal inspiration accounts for mild bibasilar atelectasis. No acute cardiopulmonary disease otherwise. Electronically Signed   By: Evangeline Dakin M.D.   On: 08/03/2015 15:00    ASSESSMENT: ITP, hyperglycemia, UTI.  PLAN:    1. ITP: Confirmed by bone marrow biopsy. Patient was initiated on 80 mg of prednisone per day with minimal improvement of his platelets. He received cycle 1 of 4 of weekly Rituxan last Friday. His platelet count improved over the weekend, but is now trending back  down. Because of his hyperglycemia, have decrease his prednisone dose in half to 40 mg daily. His next dose of Rituxan will occur on either Thursday or Friday later this week. Patient does not require a platelet transfusion at this time, but would consider one of his platelet count fell below 20,000. 2. Chronic renal insufficiency: Creatinine is approximately patient's baseline. Monitor. 3. Anemia: Mild, monitor. 4. Hyperglycemia: Likely secondary to high dose of prednisone, decrease dose to 40 mg daily as above and then will continue to taper over the next 1-2 weeks. 5. UTI: Agree with current antibiotics.   Appreciate consult, will follow.   [name], MD   08/06/2015 5:19 PM

## 2015-08-06 NOTE — ED Notes (Signed)
Repeat troponin obtained. Pt provided with additional blankets for feet. Call bell at side.

## 2015-08-06 NOTE — ED Notes (Signed)
Pt denies further needs at this time.

## 2015-08-06 NOTE — Care Management Important Message (Signed)
Important Message  Patient Details  Name: Alejandro Armstrong MRN: [id number] Date of Birth: Apr 08, 1930   Medicare Important Message Given:  Yes    Phylisha Dix A, RN 08/06/2015, 8:12 AM

## 2015-08-06 NOTE — Progress Notes (Signed)
Inpatient Diabetes Program Recommendations  AACE/ADA: New Consensus Statement on Inpatient Glycemic Control (2015)  Target Ranges:  Prepandial:   less than 140 mg/dL      Peak postprandial:   less than 180 mg/dL (1-2 hours)      Critically ill patients:  140 - 180 mg/dL    Results for SOLOMON, LILLQUIST (MRN [id number]) as of 08/06/2015 15:34  Ref. Range 08/05/2015 23:33 08/06/2015 08:24 08/06/2015 11:14  Glucose-Capillary Latest Ref Range: 65-99 mg/dL 275 (H) 266 (H) 277 (H)    Admit with: Acute Renal Failure/ Metabolic Acidosis/ Hyperkalemia  History: DM, HTN, ITP  Home DM Meds: None  Current Insulin Orders: Novolog Resistant Correction Scale/ SSI (0-20 units) TID AC + HS     -Note patient just discharged from [facility] on 08/[facility]/15 with Rx for Prednisone 80 mg daily to treat ITP.  -Readmitted 02/19 with Acute Renal Failure/ Metabolic Acidosis/ Hyperkalemia.  -Glucose 518 mg/dl on admission labs.  -Spoke with patient's wife today who told me that patient's PCP (Dr. Casilda Carls) gave patient a Rx to start Levemir 20 units daily at home.  No education was given as to how to use the insulin pen and patient's wife had many questions.  Was only given 1 Levemir insulin pen along with 7 insulin pen needles.  Explained to patient's wife that we will follow patient's blood sugars closely here in the hospital and that the MD who is managing patient's care may change the insulin at discharge.  Patient's wife knows how to use CBG meter but was concerned about the insulin pen.   -Educated patient's spouse on insulin pen use at home.  Reviewed all steps of insulin pen including attachment of needle, 2-unit air shot, dialing up dose, giving injection, removing needle, disposal of sharps, storage of unused insulin, disposal of insulin etc.  Patient's wife able to provide successful return demonstration but needed much reinforcement. Have asked bedside RN to please continue insulin education with wife at  bedside and will also have DM Coordinator follow up with patient's wife tomorrow to field additional questions.    MD- Not sure if one shot of Levemir once daily is the appropriate insulin for patient to take at home while on steroids.  Note that patient's PCP started him on Levemir 20 units daily.  Also note Prednisone decreased to 40 mg daily today (was 80 mg daily).  Perhaps patient needs Levemir and Novolog at home?  Not sure if wife will be able to handle giving 4 injections per day as she was already overwhelmed with the idea of giving one injection per day.  MD- Please consider starting Levemir 13 units daily (0.15 units/kg dosing) while here in hospital  Also, patient will need Rx for insulin pen needles at time of d/c as patient was only given 7 pen needles from PCP office  Insulin Pen needles Order [id number]     --Will follow patient during hospitalization--  Wyn Quaker RN, MSN, CDE Diabetes Coordinator Inpatient Glycemic Control Team Team Pager: [phone] (8a-5p)

## 2015-08-06 NOTE — Progress Notes (Signed)
Subjective:  Patient feels poorly overall States he is admitted for low platelets Currently having loose stools  Objective:  Vital signs in last 24 hours:  Temp:  [97.8 F (36.6 C)-99.5 F (37.5 C)] 97.9 F (36.6 C) (02/20 0822) Pulse Rate:  [78-108] 78 (02/20 0822) Resp:  [17-28] 17 (02/20 0822) BP: (142-186)/(75-103) 142/88 mmHg (02/20 0822) SpO2:  [93 %-100 %] 97 % (02/20 0822) Weight:  [79.379 kg (175 lb)-84.006 kg (185 lb 3.2 oz)] 84.006 kg (185 lb 3.2 oz) (02/20 0500)  Weight change:  Filed Weights   08/05/15 2110 08/06/15 0500  Weight: 79.379 kg (175 lb) 84.006 kg (185 lb 3.2 oz)    Intake/Output:    Intake/Output Summary (Last 24 hours) at 08/06/15 1122 Last data filed at 08/06/15 0546  Gross per 24 hour  Intake 271.67 ml  Output    400 ml  Net -128.33 ml     Physical Exam: General: No acute distress, ill appearing, laying in the bed  HEENT Dry oral mucous membranes  Neck supple  Pulm/lungs Scattered rhonchi, mild basilar crackles  CVS/Heart No rub or gallop  Abdomen:  Soft, nontender  Extremities: + some dependent edema  Neurologic: Alert, oriented  Skin: Scattered ecchymosis  Access:        Basic Metabolic Panel:   Recent Labs Lab 08/05/15 2114 08/06/15 0356  NA 131* 138  K 5.9* 5.6*  CL 104 111  CO2 15* 18*  GLUCOSE 518* 315*  BUN 73* 69*  CREATININE 3.15* 2.79*  CALCIUM 7.9* 7.8*     CBC:  Recent Labs Lab 07/31/15 1121 08/02/15 1308 08/04/15 0539 08/05/15 2114 08/06/15 0356  WBC 11.9* 12.2* 7.8 22.8* 15.1*  NEUTROABS 10.3* 11.1*  --   --   --   HGB 10.5* 11.0* 8.0* 9.9* 9.8*  HCT 30.6* 31.3* 23.2* 29.4* 28.7*  MCV 94.1 95.4 97.4 96.8 98.0  PLT 5* 10* 72* 65* 45*      Microbiology:  Recent Results (from the past 720 hour(s))  C difficile quick scan w PCR reflex     Status: None   Collection Time: 08/06/15  2:35 AM  Result Value Ref Range Status   C Diff antigen NEGATIVE NEGATIVE Final   C Diff toxin NEGATIVE  NEGATIVE Final   C Diff interpretation Negative for C. difficile  Final    Coagulation Studies: No results for input(s): LABPROT, INR in the last 72 hours.  Urinalysis:  Recent Labs  08/05/15 2114  COLORURINE STRAW*  LABSPEC 1.014  PHURINE 6.0  GLUCOSEU >500*  HGBUR 1+*  BILIRUBINUR NEGATIVE  KETONESUR NEGATIVE  PROTEINUR 100*  NITRITE NEGATIVE  LEUKOCYTESUR TRACE*      Imaging: Ct Abdomen Pelvis Wo Contrast  08/06/2015  CLINICAL DATA:  Acute onset of vomiting and tachycardia. Idiopathic thrombocytopenic purpura. Initial encounter. EXAM: CT CHEST, ABDOMEN AND PELVIS WITHOUT CONTRAST TECHNIQUE: Multidetector CT imaging of the chest, abdomen and pelvis was performed following the standard protocol without IV contrast. COMPARISON:  Chest radiograph performed 08/03/2015 FINDINGS: CT CHEST Mild atelectasis or scarring is noted at the left lung base. The lungs are otherwise clear. There is no evidence of focal consolidation, pleural effusion or pneumothorax. No masses are identified. Diffuse coronary artery calcifications are seen. A small to moderate hiatal hernia is noted. No mediastinal lymphadenopathy is seen. No pericardial effusion is identified. The great vessels are grossly unremarkable. A right-sided PICC is noted ending about the distal SVC. The thyroid gland is diminutive and grossly unremarkable. No axillary  lymphadenopathy is seen. No acute osseous abnormalities are identified. CT ABDOMEN AND PELVIS The liver and spleen are unremarkable in appearance. The gallbladder is within normal limits. The pancreas and adrenal glands are unremarkable. Left-sided renal atrophy is noted, with likely left-sided renal cysts and scattered parenchymal calcifications at the lower pole of the left kidney. The right kidney is markedly atrophic. There is no evidence of hydronephrosis. No renal or ureteral stones are identified. No free fluid is identified. The small bowel is unremarkable in appearance.  The stomach is otherwise within normal limits. No acute vascular abnormalities are seen. Scattered calcification is noted along the abdominal aorta. The appendix is not definitely characterized; there is no evidence of appendicitis. The colon is unremarkable in appearance. The bladder is mildly distended and grossly unremarkable. The prostate remains normal in size. No inguinal lymphadenopathy is seen. No acute osseous abnormalities are identified. There is chronic loss of height involving vertebral body L1. IMPRESSION: 1. No acute abnormality seen within the abdomen or pelvis. 2. Mild left basilar atelectasis or scarring. Lungs otherwise clear. 3. Diffuse coronary artery calcifications seen. 4. Small to moderate hiatal hernia noted. 5. Left-sided renal atrophy, with likely left-sided renal cysts and parenchymal calcifications at the lower pole of the left kidney. Marked right renal atrophy. 6. Scattered calcification along the abdominal aorta. 7. Chronic loss of height involving vertebral body L1. Electronically Signed   By: Garald Balding M.D.   On: 08/06/2015 01:09   Ct Chest Wo Contrast  08/06/2015  CLINICAL DATA:  Acute onset of vomiting and tachycardia. Idiopathic thrombocytopenic purpura. Initial encounter. EXAM: CT CHEST, ABDOMEN AND PELVIS WITHOUT CONTRAST TECHNIQUE: Multidetector CT imaging of the chest, abdomen and pelvis was performed following the standard protocol without IV contrast. COMPARISON:  Chest radiograph performed 08/03/2015 FINDINGS: CT CHEST Mild atelectasis or scarring is noted at the left lung base. The lungs are otherwise clear. There is no evidence of focal consolidation, pleural effusion or pneumothorax. No masses are identified. Diffuse coronary artery calcifications are seen. A small to moderate hiatal hernia is noted. No mediastinal lymphadenopathy is seen. No pericardial effusion is identified. The great vessels are grossly unremarkable. A right-sided PICC is noted ending about  the distal SVC. The thyroid gland is diminutive and grossly unremarkable. No axillary lymphadenopathy is seen. No acute osseous abnormalities are identified. CT ABDOMEN AND PELVIS The liver and spleen are unremarkable in appearance. The gallbladder is within normal limits. The pancreas and adrenal glands are unremarkable. Left-sided renal atrophy is noted, with likely left-sided renal cysts and scattered parenchymal calcifications at the lower pole of the left kidney. The right kidney is markedly atrophic. There is no evidence of hydronephrosis. No renal or ureteral stones are identified. No free fluid is identified. The small bowel is unremarkable in appearance. The stomach is otherwise within normal limits. No acute vascular abnormalities are seen. Scattered calcification is noted along the abdominal aorta. The appendix is not definitely characterized; there is no evidence of appendicitis. The colon is unremarkable in appearance. The bladder is mildly distended and grossly unremarkable. The prostate remains normal in size. No inguinal lymphadenopathy is seen. No acute osseous abnormalities are identified. There is chronic loss of height involving vertebral body L1. IMPRESSION: 1. No acute abnormality seen within the abdomen or pelvis. 2. Mild left basilar atelectasis or scarring. Lungs otherwise clear. 3. Diffuse coronary artery calcifications seen. 4. Small to moderate hiatal hernia noted. 5. Left-sided renal atrophy, with likely left-sided renal cysts and parenchymal calcifications at the  lower pole of the left kidney. Marked right renal atrophy. 6. Scattered calcification along the abdominal aorta. 7. Chronic loss of height involving vertebral body L1. Electronically Signed   By: Garald Balding M.D.   On: 08/06/2015 01:09     Medications:   . sodium chloride 100 mL/hr at 08/06/15 0303   . amLODipine  5 mg Oral Daily  . calcitRIOL  0.25 mcg Oral Daily  . cefTRIAXone (ROCEPHIN)  IV  1 g Intravenous Q24H   . insulin aspart  0-20 Units Subcutaneous TID WC  . insulin aspart  0-5 Units Subcutaneous QHS  . levothyroxine  125 mcg Oral QAC breakfast  . predniSONE  80 mg Oral Q breakfast  . sodium bicarbonate  1,300 mg Oral BID   acetaminophen **OR** acetaminophen, albuterol, bisacodyl, iohexol, morphine injection, ondansetron **OR** ondansetron (ZOFRAN) IV, oxyCODONE, polyethylene glycol  Assessment/ Plan:  82 y.o. 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 Chronic kidney disease stage IV/congenitally absent right kidney/proteinuria.  2. Hyperkalemia 3. Diabetes with CKD 4. Proteinuria  Plan: Low potassium diet Patient may have hyponatremia anemic state from long-standing diabetes - Trial of Veltassa to control potassium - avoid ACE inhibitor/ARB due to hyperkalemia     LOS: 0 Alejandro Armstrong 2/20/201711:22 AM

## 2015-08-06 NOTE — H&P (Signed)
[facility] NAME: Alejandro Armstrong    MR#:  [id number]  DATE OF BIRTH:  [birth year]  DATE OF ADMISSION:  08/05/2015  PRIMARY CARE PHYSICIAN: Casilda Carls, MD   REQUESTING/REFERRING PHYSICIAN: Dr. Dahlia Client  CHIEF COMPLAINT:   Chief Complaint  Patient presents with  . Emesis    HISTORY OF PRESENT ILLNESS:  Alejandro Armstrong  is a 82 y.o. male with a known history of hypertension, diabetes, recent ITP returns to the emergency room on the day of discharge after he started having vomiting and suprapubic pain. Patient was admitted to the hospital by Dr. Grayland Ormond on 08/03/2015 and discharged on 08/04/2015. He was on high-dose prednisone and received 2 doses of Rituxan. Patient also found his blood sugars to be greater than 400 at home. No dysuria or hematuria. Here patient had a CT scan of the abdomen which showed nothing acute. Urinalysis showed UTI with WBC 22,000. Worsening acute renal failure and metabolic acidosis along with hyperkalemia. Platelets are slowly improving. No bleeding.  PAST MEDICAL HISTORY:   Past Medical History  Diagnosis Date  . Hyperlipidemia   . Hypertension   . Hypothyroidism   . Type 2 diabetes mellitus ([place])   . Renal agenesis     discovered at age 29  . Recurrent UTI   . Ureteral stricture     PAST SURGICAL HISTORY:  No past surgical history on file.  SOCIAL HISTORY:   Social History  Substance Use Topics  . Smoking status: Never Smoker   . Smokeless tobacco: Never Used  . Alcohol Use: No    FAMILY HISTORY:   Family History  Problem Relation Age of Onset  . Cervical cancer Sister     DRUG ALLERGIES:  No Known Allergies  REVIEW OF SYSTEMS:   Review of Systems  Constitutional: Positive for chills and malaise/fatigue. Negative for fever and weight loss.  HENT: Negative for hearing loss and nosebleeds.   Eyes: Negative for blurred vision, double vision and pain.  Respiratory:  Negative for cough, hemoptysis, sputum production, shortness of breath and wheezing.   Cardiovascular: Negative for chest pain, palpitations, orthopnea and leg swelling.  Gastrointestinal: Positive for nausea, vomiting and abdominal pain. Negative for diarrhea and constipation.  Genitourinary: Negative for dysuria and hematuria.  Musculoskeletal: Positive for myalgias. Negative for back pain and falls.  Skin: Negative for rash.  Neurological: Positive for weakness. Negative for dizziness, tremors, sensory change, speech change, focal weakness, seizures and headaches.  Endo/Heme/Allergies: Does not bruise/bleed easily.  Psychiatric/Behavioral: Negative for depression and memory loss. The patient is not nervous/anxious.     MEDICATIONS AT HOME:   Prior to Admission medications   Medication Sig Start Date End Date Taking? Authorizing Provider  amLODipine (NORVASC) 5 MG tablet Take 5 mg by mouth. 07/26/13   Historical Provider, MD  calcitRIOL (ROCALTROL) 0.25 MCG capsule Take 0.25 mcg by mouth daily.    Historical Provider, MD  cholecalciferol (VITAMIN D) 1000 units tablet Take 1,000 Units by mouth daily.    Historical Provider, MD  ferrous sulfate 325 (65 FE) MG tablet Take 325 mg by mouth.    Historical Provider, MD  levothyroxine (SYNTHROID, LEVOTHROID) 125 MCG tablet  08/07/14   Historical Provider, MD  lisinopril (PRINIVIL,ZESTRIL) 20 MG tablet Take 20 mg by mouth. 06/24/12   Historical Provider, MD  Omega-3 1000 MG CAPS Take by mouth.    Historical Provider, MD  predniSONE (DELTASONE) 20 MG tablet Take 4 tablets (80  mg total) by mouth daily. 08/01/15   [name], MD  sodium bicarbonate 650 MG tablet  08/07/14   Historical Provider, MD      VITAL SIGNS:  Blood pressure 157/80, pulse 86, temperature 97.8 F (36.6 C), temperature source Oral, resp. rate 18, height 5\' 7"  (1.702 m), weight 79.379 kg (175 lb), SpO2 93 %.  PHYSICAL EXAMINATION:  Physical Exam  GENERAL:  82  y.o.-year-old patient lying in the bed with no acute distress.  EYES: Pupils equal, round, reactive to light and accommodation. No scleral icterus. Extraocular muscles intact.  HEENT: Head atraumatic, normocephalic. Oropharynx and nasopharynx clear. No oropharyngeal erythema, moist oral mucosa  NECK:  Supple, no jugular venous distention. No thyroid enlargement, no tenderness.  LUNGS: Normal breath sounds bilaterally, no wheezing, rales, rhonchi. No use of accessory muscles of respiration.  CARDIOVASCULAR: S1, S2 normal. No murmurs, rubs, or gallops.  ABDOMEN: Soft, nontender, nondistended. Bowel sounds present. No organomegaly or mass. Mild suprapubic tenderness EXTREMITIES: No pedal edema, cyanosis, or clubbing. + 2 pedal & radial pulses b/l.   NEUROLOGIC: Cranial nerves II through XII are intact. No focal Motor or sensory deficits appreciated b/l PSYCHIATRIC: The patient is alert and awake. SKIN: No obvious rash, lesion, or ulcer.   LABORATORY PANEL:   CBC  Recent Labs Lab 08/05/15 2114  WBC 22.8*  HGB 9.9*  HCT 29.4*  PLT 65*   ------------------------------------------------------------------------------------------------------------------  Chemistries   Recent Labs Lab 08/05/15 2114  NA 131*  K 5.9*  CL 104  CO2 15*  GLUCOSE 518*  BUN 73*  CREATININE 3.15*  CALCIUM 7.9*  AST 27  ALT 25  ALKPHOS 121  BILITOT 0.9   ------------------------------------------------------------------------------------------------------------------  Cardiac Enzymes  Recent Labs Lab 08/06/15 0010  TROPONINI <0.03   ------------------------------------------------------------------------------------------------------------------  RADIOLOGY:  Ct Abdomen Pelvis Wo Contrast  08/06/2015  CLINICAL DATA:  Acute onset of vomiting and tachycardia. Idiopathic thrombocytopenic purpura. Initial encounter. EXAM: CT CHEST, ABDOMEN AND PELVIS WITHOUT CONTRAST TECHNIQUE: Multidetector CT  imaging of the chest, abdomen and pelvis was performed following the standard protocol without IV contrast. COMPARISON:  Chest radiograph performed 08/03/2015 FINDINGS: CT CHEST Mild atelectasis or scarring is noted at the left lung base. The lungs are otherwise clear. There is no evidence of focal consolidation, pleural effusion or pneumothorax. No masses are identified. Diffuse coronary artery calcifications are seen. A small to moderate hiatal hernia is noted. No mediastinal lymphadenopathy is seen. No pericardial effusion is identified. The great vessels are grossly unremarkable. A right-sided PICC is noted ending about the distal SVC. The thyroid gland is diminutive and grossly unremarkable. No axillary lymphadenopathy is seen. No acute osseous abnormalities are identified. CT ABDOMEN AND PELVIS The liver and spleen are unremarkable in appearance. The gallbladder is within normal limits. The pancreas and adrenal glands are unremarkable. Left-sided renal atrophy is noted, with likely left-sided renal cysts and scattered parenchymal calcifications at the lower pole of the left kidney. The right kidney is markedly atrophic. There is no evidence of hydronephrosis. No renal or ureteral stones are identified. No free fluid is identified. The small bowel is unremarkable in appearance. The stomach is otherwise within normal limits. No acute vascular abnormalities are seen. Scattered calcification is noted along the abdominal aorta. The appendix is not definitely characterized; there is no evidence of appendicitis. The colon is unremarkable in appearance. The bladder is mildly distended and grossly unremarkable. The prostate remains normal in size. No inguinal lymphadenopathy is seen. No acute osseous abnormalities are identified.  There is chronic loss of height involving vertebral body L1. IMPRESSION: 1. No acute abnormality seen within the abdomen or pelvis. 2. Mild left basilar atelectasis or scarring. Lungs  otherwise clear. 3. Diffuse coronary artery calcifications seen. 4. Small to moderate hiatal hernia noted. 5. Left-sided renal atrophy, with likely left-sided renal cysts and parenchymal calcifications at the lower pole of the left kidney. Marked right renal atrophy. 6. Scattered calcification along the abdominal aorta. 7. Chronic loss of height involving vertebral body L1. Electronically Signed   By: Garald Balding M.D.   On: 08/06/2015 01:09   Ct Chest Wo Contrast  08/06/2015  CLINICAL DATA:  Acute onset of vomiting and tachycardia. Idiopathic thrombocytopenic purpura. Initial encounter. EXAM: CT CHEST, ABDOMEN AND PELVIS WITHOUT CONTRAST TECHNIQUE: Multidetector CT imaging of the chest, abdomen and pelvis was performed following the standard protocol without IV contrast. COMPARISON:  Chest radiograph performed 08/03/2015 FINDINGS: CT CHEST Mild atelectasis or scarring is noted at the left lung base. The lungs are otherwise clear. There is no evidence of focal consolidation, pleural effusion or pneumothorax. No masses are identified. Diffuse coronary artery calcifications are seen. A small to moderate hiatal hernia is noted. No mediastinal lymphadenopathy is seen. No pericardial effusion is identified. The great vessels are grossly unremarkable. A right-sided PICC is noted ending about the distal SVC. The thyroid gland is diminutive and grossly unremarkable. No axillary lymphadenopathy is seen. No acute osseous abnormalities are identified. CT ABDOMEN AND PELVIS The liver and spleen are unremarkable in appearance. The gallbladder is within normal limits. The pancreas and adrenal glands are unremarkable. Left-sided renal atrophy is noted, with likely left-sided renal cysts and scattered parenchymal calcifications at the lower pole of the left kidney. The right kidney is markedly atrophic. There is no evidence of hydronephrosis. No renal or ureteral stones are identified. No free fluid is identified. The small  bowel is unremarkable in appearance. The stomach is otherwise within normal limits. No acute vascular abnormalities are seen. Scattered calcification is noted along the abdominal aorta. The appendix is not definitely characterized; there is no evidence of appendicitis. The colon is unremarkable in appearance. The bladder is mildly distended and grossly unremarkable. The prostate remains normal in size. No inguinal lymphadenopathy is seen. No acute osseous abnormalities are identified. There is chronic loss of height involving vertebral body L1. IMPRESSION: 1. No acute abnormality seen within the abdomen or pelvis. 2. Mild left basilar atelectasis or scarring. Lungs otherwise clear. 3. Diffuse coronary artery calcifications seen. 4. Small to moderate hiatal hernia noted. 5. Left-sided renal atrophy, with likely left-sided renal cysts and parenchymal calcifications at the lower pole of the left kidney. Marked right renal atrophy. 6. Scattered calcification along the abdominal aorta. 7. Chronic loss of height involving vertebral body L1. Electronically Signed   By: Garald Balding M.D.   On: 08/06/2015 01:09     IMPRESSION AND PLAN:   * UTI with leukocytosis of 22,000. His vomiting is likely due to UTI. Start IV ceftriaxone and await culture results. His leukocytosis could also be due to prednisone.  * ARF with hyperkalemia  Due to dehydration from vomiting and hyperglycemia. Creatinine is close to baseline but his BUN is significantly elevated. IV fluids. Also has metabolic acidosis. On sodium bicarbonate pills.  * Uncontrolled diabetes mellitus Due to high-dose prednisone. Prednisone needs to be continued for his ITP. Place him on resistant scale of insulin.  * Dehydration Due to vomiting and hyperglycemia. Start IV fluids.  * ITP Improving with  prednisone. Patient received 2 doses of Rituxan in the hospital on 2/17 and 2/18.  * DVT prophylaxis with SCDs. No heparin or Lovenox due to  thrombocytopenia.   All the records are reviewed and case discussed with ED provider. Management plans discussed with the patient, family and they are in agreement.  CODE STATUS: FULL CODE  TOTAL TIME TAKING CARE OF THIS PATIENT: 40 minutes.    Hillary Bow R M.D on 08/06/2015 at 1:45 AM  Between 7am to 6pm - Pager - [phone]  After 6pm go to [email or web address] - password [email or web address] [facility]  Office  [phone]  CC: Primary care physician; Casilda Carls, MD  Note: This dictation was prepared with Dragon dictation along with smaller phrase technology. Any transcriptional errors that result from this process are unintentional.

## 2015-08-06 NOTE — ED Notes (Signed)
Pt continues to rest after returning from ct scan. Pt assisted with urinal with production of 31mL dilute urine. Pt provided with additional warm blankets.

## 2015-08-06 NOTE — ED Provider Notes (Signed)
-----------------------------------------   2:29 AM on 08/06/2015 -----------------------------------------   Blood pressure 152/80, pulse 86, temperature 97.8 F (36.6 C), temperature source Oral, resp. rate 19, height 5\' 7"  (1.702 m), weight 175 lb (79.379 kg), SpO2 96 %.  Assuming care from Dr. Joni Fears.  In short, Alejandro Armstrong is a 82 y.o. male with a chief complaint of Emesis .  Refer to the original H&P for additional details.  The current plan of care is to follow-up the results of the CT scan and admit the patient..  CT abdomen and pelvis: No acute abnormality seen within the abdomen or pelvis, mild left basilar atelectasis or scarring lungs otherwise clear, diffuse coronary artery calcifications seen, small to moderate hiatal hernia noted, left-sided renal atrophy, scattered calcification along the abdominal aorta.  The patient will be admitted to the hospitalist service.   Loney Hering, MD 08/06/15 0230

## 2015-08-06 NOTE — Telephone Encounter (Signed)
Spoke with Colletta [place] in [place], order cancelled for tomorrow.

## 2015-08-07 ENCOUNTER — Ambulatory Visit: Admission: RE | Admit: 2015-08-07 | Payer: Medicare HMO | Source: Ambulatory Visit

## 2015-08-07 LAB — CBC
HCT: 25.6 % — ABNORMAL LOW (ref 40.0–52.0)
Hemoglobin: 8.6 g/dL — ABNORMAL LOW (ref 13.0–18.0)
MCH: 33 pg (ref 26.0–34.0)
MCHC: 33.5 g/dL (ref 32.0–36.0)
MCV: 98.5 fL (ref 80.0–100.0)
PLATELETS: 34 10*3/uL — AB (ref 150–440)
RBC: 2.6 MIL/uL — ABNORMAL LOW (ref 4.40–5.90)
RDW: 15.7 % — AB (ref 11.5–14.5)
WBC: 6.2 10*3/uL (ref 3.8–10.6)

## 2015-08-07 LAB — BASIC METABOLIC PANEL
ANION GAP: 5 (ref 5–15)
BUN: 58 mg/dL — ABNORMAL HIGH (ref 6–20)
CALCIUM: 8 mg/dL — AB (ref 8.9–10.3)
CO2: 18 mmol/L — ABNORMAL LOW (ref 22–32)
CREATININE: 2.46 mg/dL — AB (ref 0.61–1.24)
Chloride: 117 mmol/L — ABNORMAL HIGH (ref 101–111)
GFR, EST AFRICAN AMERICAN: 26 mL/min — AB (ref >60)
GFR, EST NON AFRICAN AMERICAN: 23 mL/min — AB (ref >60)
Glucose, Bld: 196 mg/dL — ABNORMAL HIGH (ref 65–99)
Potassium: 5.3 mmol/L — ABNORMAL HIGH (ref 3.5–5.1)
SODIUM: 140 mmol/L (ref 135–145)

## 2015-08-07 LAB — URINE CULTURE

## 2015-08-07 LAB — GLUCOSE, CAPILLARY
Glucose-Capillary: 169 mg/dL — ABNORMAL HIGH (ref 65–99)
Glucose-Capillary: 223 mg/dL — ABNORMAL HIGH (ref 65–99)
Glucose-Capillary: 318 mg/dL — ABNORMAL HIGH (ref 65–99)
Glucose-Capillary: 190 mg/dL — ABNORMAL HIGH (ref 65–99)

## 2015-08-07 LAB — NOROVIRUS GROUP 1 & 2 BY PCR, STOOL
Norovirus 1 by PCR: NEGATIVE
Norovirus 2  by PCR: POSITIVE — AB

## 2015-08-07 LAB — DIRECT PLATELET ANTIBODY
Plt Assoc. Anti-IA/IIA: NEGATIVE
Plt Assoc. Anti-IB/IX: NEGATIVE
Plt Assoc. Anti-IIB/IIIA: POSITIVE — AB

## 2015-08-07 MED ORDER — INSULIN GLARGINE 100 UNIT/ML ~~LOC~~ SOLN
12.0000 [IU] | Freq: Every day | SUBCUTANEOUS | Status: DC
  Administered 2015-08-07: 12 [IU] via SUBCUTANEOUS
  Filled 2015-08-07 (×2): qty 0.12

## 2015-08-07 MED ORDER — LIVING WELL WITH DIABETES BOOK
Freq: Once | Status: AC
  Administered 2015-08-07: 12:00:00
  Filled 2015-08-07: qty 1

## 2015-08-07 MED ORDER — CEFUROXIME AXETIL 250 MG PO TABS
250.0000 mg | ORAL_TABLET | Freq: Two times a day (BID) | ORAL | Status: DC
  Administered 2015-08-07 – 2015-08-08 (×2): 250 mg via ORAL
  Filled 2015-08-07 (×3): qty 1
  Filled 2015-08-07: qty 0.5
  Filled 2015-08-07 (×2): qty 1

## 2015-08-07 NOTE — Progress Notes (Signed)
Inpatient Diabetes Program Recommendations  AACE/ADA: New Consensus Statement on Inpatient Glycemic Control (2015)  Target Ranges:  Prepandial:   less than 140 mg/dL      Peak postprandial:   less than 180 mg/dL (1-2 hours)      Critically ill patients:  140 - 180 mg/dL    Inpatient Diabetes Program Recommendations: Met with the patient and his wife to review insulin pen administration again.  She was very nervous handling the pen and had to talk her way through the process but after talking through it, was able to accomplish the prep.  He watched as I demonstrated and while she prepped it and he attempted to give [place] a return demonstration.  He was not able to prepare the pen but she was able to talk him through it.    We reviewed again, preparing the pen, priming, site rotation, discarding the needle and administration.  I showed her the insulin pen pictorial and written instructions in the insulin teaching kit. I reminded them (and wrote) that the insulin needed to be discarded after 28 days once the pen was opened.   They have a neighbor who has diabetes and I recommended they have her watch them give the insulin the first few times they administer it.    I have asked the student nurse if she would be willing to review the steps of the insulin process with them again later today.   Please review with the patient and his wife, how to treat a low blood sugar at every opportunity. They are a little overwhelmed with the insulin instructions and will do better if small chunks of information are given often.  The Living Well with diabetes book was not in the room and the wife denies receiving it so I have re-ordered it.  Gentry Fitz, RN, BA, MHA, CDE Diabetes Coordinator Inpatient Diabetes Program  [phone] (Team Pager) [phone] ([place]) 08/07/2015 10:18 AM

## 2015-08-07 NOTE — Progress Notes (Signed)
[place] at [place] NAME: Alejandro Armstrong    MR#:  [id number]  DATE OF BIRTH:  18-Jan-1930  SUBJECTIVE:  CHIEF COMPLAINT:   Chief Complaint  Patient presents with  . Emesis    Just got Rituxan for ITP, came back with nausea, diarrhea. Plt going down again.  REVIEW OF SYSTEMS:  CONSTITUTIONAL: No fever, fatigue or weakness.  EYES: No blurred or double vision.  EARS, NOSE, AND THROAT: No tinnitus or ear pain.  RESPIRATORY: No cough, shortness of breath, wheezing or hemoptysis.  CARDIOVASCULAR: No chest pain, orthopnea, edema.  GASTROINTESTINAL: No nausea, vomiting, diarrhea or abdominal pain.  GENITOURINARY: No dysuria, hematuria.  ENDOCRINE: No polyuria, nocturia,  HEMATOLOGY: No anemia, easy bruising or bleeding SKIN: No rash or lesion. MUSCULOSKELETAL: No joint pain or arthritis.   NEUROLOGIC: No tingling, numbness, weakness.  PSYCHIATRY: No anxiety or depression.   ROS  DRUG ALLERGIES:  No Known Allergies  VITALS:  Blood pressure 120/70, pulse 56, temperature 97.8 F (36.6 C), temperature source Oral, resp. rate 18, height 5\' 7"  (1.702 m), weight 82.736 kg (182 lb 6.4 oz), SpO2 95 %.  PHYSICAL EXAMINATION:   GENERAL: 82 y.o.-year-old patient lying in the bed with no acute distress.  EYES: Pupils equal, round, reactive to light and accommodation. No scleral icterus. Extraocular muscles intact.  HEENT: Head atraumatic, normocephalic. Oropharynx and nasopharynx clear. No oropharyngeal erythema, moist oral mucosa  NECK: Supple, no jugular venous distention. No thyroid enlargement, no tenderness.  LUNGS: Normal breath sounds bilaterally, no wheezing, rales, rhonchi. No use of accessory muscles of respiration.  CARDIOVASCULAR: S1, S2 normal. No murmurs, rubs, or gallops.  ABDOMEN: Soft, nontender, nondistended. Bowel sounds present. No organomegaly or mass. Mild suprapubic tenderness EXTREMITIES: No pedal edema,  cyanosis, or clubbing. + 2 pedal & radial pulses b/l.  NEUROLOGIC: Cranial nerves II through XII are intact. No focal Motor or sensory deficits appreciated b/l PSYCHIATRIC: The patient is alert and awake. SKIN: No obvious rash, lesion, or ulcer. Pt has hematomas, and suncutaneous blood- on both arms- as a trial of PICC line- 2 days ago with low platelets.  Physical Exam LABORATORY PANEL:   CBC  Recent Labs Lab 08/07/15 0426  WBC 6.2  HGB 8.6*  HCT 25.6*  PLT 34*   ------------------------------------------------------------------------------------------------------------------  Chemistries   Recent Labs Lab 08/05/15 2114  08/07/15 0426  NA 131*  < > 140  K 5.9*  < > 5.3*  CL 104  < > 117*  CO2 15*  < > 18*  GLUCOSE 518*  < > 196*  BUN 73*  < > 58*  CREATININE 3.15*  < > 2.46*  CALCIUM 7.9*  < > 8.0*  AST 27  --   --   ALT 25  --   --   ALKPHOS 121  --   --   BILITOT 0.9  --   --   < > = values in this interval not displayed. ------------------------------------------------------------------------------------------------------------------  Cardiac Enzymes  Recent Labs Lab 08/05/15 2114 08/06/15 0010  TROPONINI 0.05* <0.03   ------------------------------------------------------------------------------------------------------------------  RADIOLOGY:  Ct Abdomen Pelvis Wo Contrast  08/06/2015  CLINICAL DATA:  Acute onset of vomiting and tachycardia. Idiopathic thrombocytopenic purpura. Initial encounter. EXAM: CT CHEST, ABDOMEN AND PELVIS WITHOUT CONTRAST TECHNIQUE: Multidetector CT imaging of the chest, abdomen and pelvis was performed following the standard protocol without IV contrast. COMPARISON:  Chest radiograph performed 08/03/2015 FINDINGS: CT CHEST Mild atelectasis or scarring is noted at the  left lung base. The lungs are otherwise clear. There is no evidence of focal consolidation, pleural effusion or pneumothorax. No masses are identified. Diffuse  coronary artery calcifications are seen. A small to moderate hiatal hernia is noted. No mediastinal lymphadenopathy is seen. No pericardial effusion is identified. The great vessels are grossly unremarkable. A right-sided PICC is noted ending about the distal SVC. The thyroid gland is diminutive and grossly unremarkable. No axillary lymphadenopathy is seen. No acute osseous abnormalities are identified. CT ABDOMEN AND PELVIS The liver and spleen are unremarkable in appearance. The gallbladder is within normal limits. The pancreas and adrenal glands are unremarkable. Left-sided renal atrophy is noted, with likely left-sided renal cysts and scattered parenchymal calcifications at the lower pole of the left kidney. The right kidney is markedly atrophic. There is no evidence of hydronephrosis. No renal or ureteral stones are identified. No free fluid is identified. The small bowel is unremarkable in appearance. The stomach is otherwise within normal limits. No acute vascular abnormalities are seen. Scattered calcification is noted along the abdominal aorta. The appendix is not definitely characterized; there is no evidence of appendicitis. The colon is unremarkable in appearance. The bladder is mildly distended and grossly unremarkable. The prostate remains normal in size. No inguinal lymphadenopathy is seen. No acute osseous abnormalities are identified. There is chronic loss of height involving vertebral body L1. IMPRESSION: 1. No acute abnormality seen within the abdomen or pelvis. 2. Mild left basilar atelectasis or scarring. Lungs otherwise clear. 3. Diffuse coronary artery calcifications seen. 4. Small to moderate hiatal hernia noted. 5. Left-sided renal atrophy, with likely left-sided renal cysts and parenchymal calcifications at the lower pole of the left kidney. Marked right renal atrophy. 6. Scattered calcification along the abdominal aorta. 7. Chronic loss of height involving vertebral body L1. Electronically  Signed   By: Garald Balding M.D.   On: 08/06/2015 01:09   Ct Chest Wo Contrast  08/06/2015  CLINICAL DATA:  Acute onset of vomiting and tachycardia. Idiopathic thrombocytopenic purpura. Initial encounter. EXAM: CT CHEST, ABDOMEN AND PELVIS WITHOUT CONTRAST TECHNIQUE: Multidetector CT imaging of the chest, abdomen and pelvis was performed following the standard protocol without IV contrast. COMPARISON:  Chest radiograph performed 08/03/2015 FINDINGS: CT CHEST Mild atelectasis or scarring is noted at the left lung base. The lungs are otherwise clear. There is no evidence of focal consolidation, pleural effusion or pneumothorax. No masses are identified. Diffuse coronary artery calcifications are seen. A small to moderate hiatal hernia is noted. No mediastinal lymphadenopathy is seen. No pericardial effusion is identified. The great vessels are grossly unremarkable. A right-sided PICC is noted ending about the distal SVC. The thyroid gland is diminutive and grossly unremarkable. No axillary lymphadenopathy is seen. No acute osseous abnormalities are identified. CT ABDOMEN AND PELVIS The liver and spleen are unremarkable in appearance. The gallbladder is within normal limits. The pancreas and adrenal glands are unremarkable. Left-sided renal atrophy is noted, with likely left-sided renal cysts and scattered parenchymal calcifications at the lower pole of the left kidney. The right kidney is markedly atrophic. There is no evidence of hydronephrosis. No renal or ureteral stones are identified. No free fluid is identified. The small bowel is unremarkable in appearance. The stomach is otherwise within normal limits. No acute vascular abnormalities are seen. Scattered calcification is noted along the abdominal aorta. The appendix is not definitely characterized; there is no evidence of appendicitis. The colon is unremarkable in appearance. The bladder is mildly distended and grossly unremarkable. The prostate  remains  normal in size. No inguinal lymphadenopathy is seen. No acute osseous abnormalities are identified. There is chronic loss of height involving vertebral body L1. IMPRESSION: 1. No acute abnormality seen within the abdomen or pelvis. 2. Mild left basilar atelectasis or scarring. Lungs otherwise clear. 3. Diffuse coronary artery calcifications seen. 4. Small to moderate hiatal hernia noted. 5. Left-sided renal atrophy, with likely left-sided renal cysts and parenchymal calcifications at the lower pole of the left kidney. Marked right renal atrophy. 6. Scattered calcification along the abdominal aorta. 7. Chronic loss of height involving vertebral body L1. Electronically Signed   By: Garald Balding M.D.   On: 08/06/2015 01:09    ASSESSMENT AND PLAN:   Active Problems:   UTI (lower urinary tract infection)  * UTI with leukocytosis of 22,000. His vomiting is likely due to UTI. Start IV ceftriaxone and insignificant culture results. changed to oral. His leukocytosis could also be due to prednisone.  * ARF with hyperkalemia  Due to dehydration from vomiting and hyperglycemia. Creatinine is close to baseline but his BUN is significantly elevated. IV fluids. Also has metabolic acidosis. On sodium bicarbonate pills.  * Uncontrolled diabetes mellitus Due to high-dose prednisone. Prednisone needs to be continued for his ITP. Placed him on sliding scale of insulin.   Added basal insulin.  * Dehydration Due to vomiting and hyperglycemia. Started IV fluids.  vomiting stopped,eating fine.  * ITP Improving with prednisone. Patient received 2 doses of Rituxan in the hospital on 2/17 and 2/18. He was discharged at Plt >70, came down again to 45- 34 - so called Oncology consult.   Follow tomorrow- as per him transfuse if < 20.  * DVT prophylaxis with SCDs. No heparin or Lovenox due to thrombocytopenia.   All the records are reviewed and case discussed with Care Management/Social Workerr. Management  plans discussed with the patient, family and they are in agreement.  CODE STATUS: Full.  TOTAL TIME TAKING CARE OF THIS PATIENT: 35 minutes.    POSSIBLE D/C IN 1-2 DAYS, DEPENDING ON CLINICAL CONDITION.   Vaughan Basta M.D on 08/07/2015   Between 7am to 6pm - Pager - [phone]  After 6pm go to [email or web address] - password [email or web address] [facility]  Office  [phone]  CC: Primary care physician; Casilda Carls, MD  Note: This dictation was prepared with Dragon dictation along with smaller phrase technology. Any transcriptional errors that result from this process are unintentional.

## 2015-08-07 NOTE — Progress Notes (Signed)
Diabetic coordinator at the bedside teaching insulin administration. Spouse at the bedside.

## 2015-08-07 NOTE — Progress Notes (Signed)
[place] at [place] NAME: Alejandro Armstrong    MR#:  [id number]  DATE OF BIRTH:  21-Jul-1929  SUBJECTIVE:  CHIEF COMPLAINT:   Chief Complaint  Patient presents with  . Emesis    Just got Rituxan for ITP, came back with nausea, diarrhea. Plt going down again.  REVIEW OF SYSTEMS:  CONSTITUTIONAL: No fever, fatigue or weakness.  EYES: No blurred or double vision.  EARS, NOSE, AND THROAT: No tinnitus or ear pain.  RESPIRATORY: No cough, shortness of breath, wheezing or hemoptysis.  CARDIOVASCULAR: No chest pain, orthopnea, edema.  GASTROINTESTINAL: No nausea, vomiting, diarrhea or abdominal pain.  GENITOURINARY: No dysuria, hematuria.  ENDOCRINE: No polyuria, nocturia,  HEMATOLOGY: No anemia, easy bruising or bleeding SKIN: No rash or lesion. MUSCULOSKELETAL: No joint pain or arthritis.   NEUROLOGIC: No tingling, numbness, weakness.  PSYCHIATRY: No anxiety or depression.   ROS  DRUG ALLERGIES:  No Known Allergies  VITALS:  Blood pressure 152/74, pulse 57, temperature 98 F (36.7 C), temperature source Oral, resp. rate 18, height 5\' 7"  (1.702 m), weight 82.736 kg (182 lb 6.4 oz), SpO2 95 %.  PHYSICAL EXAMINATION:   GENERAL: 82 y.o.-year-old patient lying in the bed with no acute distress.  EYES: Pupils equal, round, reactive to light and accommodation. No scleral icterus. Extraocular muscles intact.  HEENT: Head atraumatic, normocephalic. Oropharynx and nasopharynx clear. No oropharyngeal erythema, moist oral mucosa  NECK: Supple, no jugular venous distention. No thyroid enlargement, no tenderness.  LUNGS: Normal breath sounds bilaterally, no wheezing, rales, rhonchi. No use of accessory muscles of respiration.  CARDIOVASCULAR: S1, S2 normal. No murmurs, rubs, or gallops.  ABDOMEN: Soft, nontender, nondistended. Bowel sounds present. No organomegaly or mass. Mild suprapubic tenderness EXTREMITIES: No pedal edema,  cyanosis, or clubbing. + 2 pedal & radial pulses b/l.  NEUROLOGIC: Cranial nerves II through XII are intact. No focal Motor or sensory deficits appreciated b/l PSYCHIATRIC: The patient is alert and awake. SKIN: No obvious rash, lesion, or ulcer. Pt has hematomas, and suncutaneous blood- on both arms- as a trial of PICC line- 2 days ago with low platelets.  Physical Exam LABORATORY PANEL:   CBC  Recent Labs Lab 08/07/15 0426  WBC 6.2  HGB 8.6*  HCT 25.6*  PLT 34*   ------------------------------------------------------------------------------------------------------------------  Chemistries   Recent Labs Lab 08/05/15 2114  08/07/15 0426  NA 131*  < > 140  K 5.9*  < > 5.3*  CL 104  < > 117*  CO2 15*  < > 18*  GLUCOSE 518*  < > 196*  BUN 73*  < > 58*  CREATININE 3.15*  < > 2.46*  CALCIUM 7.9*  < > 8.0*  AST 27  --   --   ALT 25  --   --   ALKPHOS 121  --   --   BILITOT 0.9  --   --   < > = values in this interval not displayed. ------------------------------------------------------------------------------------------------------------------  Cardiac Enzymes  Recent Labs Lab 08/05/15 2114 08/06/15 0010  TROPONINI 0.05* <0.03   ------------------------------------------------------------------------------------------------------------------  RADIOLOGY:  Ct Abdomen Pelvis Wo Contrast  08/06/2015  CLINICAL DATA:  Acute onset of vomiting and tachycardia. Idiopathic thrombocytopenic purpura. Initial encounter. EXAM: CT CHEST, ABDOMEN AND PELVIS WITHOUT CONTRAST TECHNIQUE: Multidetector CT imaging of the chest, abdomen and pelvis was performed following the standard protocol without IV contrast. COMPARISON:  Chest radiograph performed 08/03/2015 FINDINGS: CT CHEST Mild atelectasis or scarring is noted at the  left lung base. The lungs are otherwise clear. There is no evidence of focal consolidation, pleural effusion or pneumothorax. No masses are identified. Diffuse  coronary artery calcifications are seen. A small to moderate hiatal hernia is noted. No mediastinal lymphadenopathy is seen. No pericardial effusion is identified. The great vessels are grossly unremarkable. A right-sided PICC is noted ending about the distal SVC. The thyroid gland is diminutive and grossly unremarkable. No axillary lymphadenopathy is seen. No acute osseous abnormalities are identified. CT ABDOMEN AND PELVIS The liver and spleen are unremarkable in appearance. The gallbladder is within normal limits. The pancreas and adrenal glands are unremarkable. Left-sided renal atrophy is noted, with likely left-sided renal cysts and scattered parenchymal calcifications at the lower pole of the left kidney. The right kidney is markedly atrophic. There is no evidence of hydronephrosis. No renal or ureteral stones are identified. No free fluid is identified. The small bowel is unremarkable in appearance. The stomach is otherwise within normal limits. No acute vascular abnormalities are seen. Scattered calcification is noted along the abdominal aorta. The appendix is not definitely characterized; there is no evidence of appendicitis. The colon is unremarkable in appearance. The bladder is mildly distended and grossly unremarkable. The prostate remains normal in size. No inguinal lymphadenopathy is seen. No acute osseous abnormalities are identified. There is chronic loss of height involving vertebral body L1. IMPRESSION: 1. No acute abnormality seen within the abdomen or pelvis. 2. Mild left basilar atelectasis or scarring. Lungs otherwise clear. 3. Diffuse coronary artery calcifications seen. 4. Small to moderate hiatal hernia noted. 5. Left-sided renal atrophy, with likely left-sided renal cysts and parenchymal calcifications at the lower pole of the left kidney. Marked right renal atrophy. 6. Scattered calcification along the abdominal aorta. 7. Chronic loss of height involving vertebral body L1. Electronically  Signed   By: Garald Balding M.D.   On: 08/06/2015 01:09   Ct Chest Wo Contrast  08/06/2015  CLINICAL DATA:  Acute onset of vomiting and tachycardia. Idiopathic thrombocytopenic purpura. Initial encounter. EXAM: CT CHEST, ABDOMEN AND PELVIS WITHOUT CONTRAST TECHNIQUE: Multidetector CT imaging of the chest, abdomen and pelvis was performed following the standard protocol without IV contrast. COMPARISON:  Chest radiograph performed 08/03/2015 FINDINGS: CT CHEST Mild atelectasis or scarring is noted at the left lung base. The lungs are otherwise clear. There is no evidence of focal consolidation, pleural effusion or pneumothorax. No masses are identified. Diffuse coronary artery calcifications are seen. A small to moderate hiatal hernia is noted. No mediastinal lymphadenopathy is seen. No pericardial effusion is identified. The great vessels are grossly unremarkable. A right-sided PICC is noted ending about the distal SVC. The thyroid gland is diminutive and grossly unremarkable. No axillary lymphadenopathy is seen. No acute osseous abnormalities are identified. CT ABDOMEN AND PELVIS The liver and spleen are unremarkable in appearance. The gallbladder is within normal limits. The pancreas and adrenal glands are unremarkable. Left-sided renal atrophy is noted, with likely left-sided renal cysts and scattered parenchymal calcifications at the lower pole of the left kidney. The right kidney is markedly atrophic. There is no evidence of hydronephrosis. No renal or ureteral stones are identified. No free fluid is identified. The small bowel is unremarkable in appearance. The stomach is otherwise within normal limits. No acute vascular abnormalities are seen. Scattered calcification is noted along the abdominal aorta. The appendix is not definitely characterized; there is no evidence of appendicitis. The colon is unremarkable in appearance. The bladder is mildly distended and grossly unremarkable. The prostate  remains  normal in size. No inguinal lymphadenopathy is seen. No acute osseous abnormalities are identified. There is chronic loss of height involving vertebral body L1. IMPRESSION: 1. No acute abnormality seen within the abdomen or pelvis. 2. Mild left basilar atelectasis or scarring. Lungs otherwise clear. 3. Diffuse coronary artery calcifications seen. 4. Small to moderate hiatal hernia noted. 5. Left-sided renal atrophy, with likely left-sided renal cysts and parenchymal calcifications at the lower pole of the left kidney. Marked right renal atrophy. 6. Scattered calcification along the abdominal aorta. 7. Chronic loss of height involving vertebral body L1. Electronically Signed   By: Garald Balding M.D.   On: 08/06/2015 01:09    ASSESSMENT AND PLAN:   Active Problems:   UTI (lower urinary tract infection)  * UTI with leukocytosis of 22,000. His vomiting is likely due to UTI. Start IV ceftriaxone and await culture results. His leukocytosis could also be due to prednisone.  * ARF with hyperkalemia  Due to dehydration from vomiting and hyperglycemia. Creatinine is close to baseline but his BUN is significantly elevated. IV fluids. Also has metabolic acidosis. On sodium bicarbonate pills.  * Uncontrolled diabetes mellitus Due to high-dose prednisone. Prednisone needs to be continued for his ITP. Placed him on sliding scale of insulin.  * Dehydration Due to vomiting and hyperglycemia. Started IV fluids.  vomiting stopped, not much eating still.  * ITP Improving with prednisone. Patient received 2 doses of Rituxan in the hospital on 2/17 and 2/18. He was discharged at Plt >70, came down again to 45- so called Oncology consult.  * DVT prophylaxis with SCDs. No heparin or Lovenox due to thrombocytopenia.   All the records are reviewed and case discussed with Care Management/Social Workerr. Management plans discussed with the patient, family and they are in agreement.  CODE STATUS:  Full.  TOTAL TIME TAKING CARE OF THIS PATIENT: 35 minutes.     POSSIBLE D/C IN 1-2 DAYS, DEPENDING ON CLINICAL CONDITION.   Vaughan Basta M.D on 08/07/2015   Between 7am to 6pm - Pager - [phone]  After 6pm go to [email or web address] - password [email or web address] [facility]  Office  [phone]  CC: Primary care physician; Casilda Carls, MD  Note: This dictation was prepared with Dragon dictation along with smaller phrase technology. Any transcriptional errors that result from this process are unintentional.

## 2015-08-07 NOTE — Progress Notes (Signed)
Subjective:  Patient feels better today No further loose stools Platelet count slightly better Potassium level improved to 5.3   Objective:  Vital signs in last 24 hours:  Temp:  [97.6 F (36.4 C)-98.2 F (36.8 C)] 97.6 F (36.4 C) (02/21 0847) Pulse Rate:  [57-61] 61 (02/21 0847) Resp:  [17-20] 17 (02/21 0847) BP: (136-159)/(74-82) 136/82 mmHg (02/21 0847) SpO2:  [95 %-96 %] 96 % (02/21 0847) Weight:  [82.736 kg (182 lb 6.4 oz)] 82.736 kg (182 lb 6.4 oz) (02/21 0453)  Weight change: 3.357 kg (7 lb 6.4 oz) Filed Weights   08/05/15 2110 08/06/15 0500 08/07/15 0453  Weight: 79.379 kg (175 lb) 84.006 kg (185 lb 3.2 oz) 82.736 kg (182 lb 6.4 oz)    Intake/Output:    Intake/Output Summary (Last 24 hours) at 08/07/15 1501 Last data filed at 08/07/15 1207  Gross per 24 hour  Intake 2978.67 ml  Output   1500 ml  Net 1478.67 ml     Physical Exam: General: No acute distress, ill appearing, laying in the bed  HEENT Dry oral mucous membranes  Neck supple  Pulm/lungs Scattered rhonchi, mild basilar crackles  CVS/Heart No rub or gallop  Abdomen:  Soft, nontender  Extremities: + some dependent edema  Neurologic: Alert, oriented  Skin: Scattered ecchymosis  Access:        Basic Metabolic Panel:   Recent Labs Lab 08/05/15 2114 08/06/15 0356 08/06/15 1435 08/07/15 0426  NA 131* 138  --  140  K 5.9* 5.6* 5.9* 5.3*  CL 104 111  --  117*  CO2 15* 18*  --  18*  GLUCOSE 518* 315*  --  196*  BUN 73* 69*  --  58*  CREATININE 3.15* 2.79*  --  2.46*  CALCIUM 7.9* 7.8*  --  8.0*     CBC:  Recent Labs Lab 08/02/15 1308 08/04/15 0539 08/05/15 2114 08/06/15 0356 08/07/15 0426  WBC 12.2* 7.8 22.8* 15.1* 6.2  NEUTROABS 11.1*  --   --   --   --   HGB 11.0* 8.0* 9.9* 9.8* 8.6*  HCT 31.3* 23.2* 29.4* 28.7* 25.6*  MCV 95.4 97.4 96.8 98.0 98.5  PLT 10* 72* 65* 45* 34*      Microbiology:  Recent Results (from the past 720 hour(s))  Urine culture     Status:  None   Collection Time: 08/05/15  9:14 PM  Result Value Ref Range Status   Specimen Description URINE, RANDOM  Final   Special Requests NONE  Final   Culture 1,000 COLONIES/mL INSIGNIFICANT GROWTH  Final   Report Status 08/07/2015 FINAL  Final  C difficile quick scan w PCR reflex     Status: None   Collection Time: 08/06/15  2:35 AM  Result Value Ref Range Status   C Diff antigen NEGATIVE NEGATIVE Final   C Diff toxin NEGATIVE NEGATIVE Final   C Diff interpretation Negative for C. difficile  Final    Coagulation Studies: No results for input(s): LABPROT, INR in the last 72 hours.  Urinalysis:  Recent Labs  08/05/15 2114  COLORURINE STRAW*  LABSPEC 1.014  PHURINE 6.0  GLUCOSEU >500*  HGBUR 1+*  BILIRUBINUR NEGATIVE  KETONESUR NEGATIVE  PROTEINUR 100*  NITRITE NEGATIVE  LEUKOCYTESUR TRACE*      Imaging: Ct Abdomen Pelvis Wo Contrast  08/06/2015  CLINICAL DATA:  Acute onset of vomiting and tachycardia. Idiopathic thrombocytopenic purpura. Initial encounter. EXAM: CT CHEST, ABDOMEN AND PELVIS WITHOUT CONTRAST TECHNIQUE: Multidetector CT imaging of the  chest, abdomen and pelvis was performed following the standard protocol without IV contrast. COMPARISON:  Chest radiograph performed 08/03/2015 FINDINGS: CT CHEST Mild atelectasis or scarring is noted at the left lung base. The lungs are otherwise clear. There is no evidence of focal consolidation, pleural effusion or pneumothorax. No masses are identified. Diffuse coronary artery calcifications are seen. A small to moderate hiatal hernia is noted. No mediastinal lymphadenopathy is seen. No pericardial effusion is identified. The great vessels are grossly unremarkable. A right-sided PICC is noted ending about the distal SVC. The thyroid gland is diminutive and grossly unremarkable. No axillary lymphadenopathy is seen. No acute osseous abnormalities are identified. CT ABDOMEN AND PELVIS The liver and spleen are unremarkable in  appearance. The gallbladder is within normal limits. The pancreas and adrenal glands are unremarkable. Left-sided renal atrophy is noted, with likely left-sided renal cysts and scattered parenchymal calcifications at the lower pole of the left kidney. The right kidney is markedly atrophic. There is no evidence of hydronephrosis. No renal or ureteral stones are identified. No free fluid is identified. The small bowel is unremarkable in appearance. The stomach is otherwise within normal limits. No acute vascular abnormalities are seen. Scattered calcification is noted along the abdominal aorta. The appendix is not definitely characterized; there is no evidence of appendicitis. The colon is unremarkable in appearance. The bladder is mildly distended and grossly unremarkable. The prostate remains normal in size. No inguinal lymphadenopathy is seen. No acute osseous abnormalities are identified. There is chronic loss of height involving vertebral body L1. IMPRESSION: 1. No acute abnormality seen within the abdomen or pelvis. 2. Mild left basilar atelectasis or scarring. Lungs otherwise clear. 3. Diffuse coronary artery calcifications seen. 4. Small to moderate hiatal hernia noted. 5. Left-sided renal atrophy, with likely left-sided renal cysts and parenchymal calcifications at the lower pole of the left kidney. Marked right renal atrophy. 6. Scattered calcification along the abdominal aorta. 7. Chronic loss of height involving vertebral body L1. Electronically Signed   By: Garald Balding M.D.   On: 08/06/2015 01:09   Ct Chest Wo Contrast  08/06/2015  CLINICAL DATA:  Acute onset of vomiting and tachycardia. Idiopathic thrombocytopenic purpura. Initial encounter. EXAM: CT CHEST, ABDOMEN AND PELVIS WITHOUT CONTRAST TECHNIQUE: Multidetector CT imaging of the chest, abdomen and pelvis was performed following the standard protocol without IV contrast. COMPARISON:  Chest radiograph performed 08/03/2015 FINDINGS: CT CHEST  Mild atelectasis or scarring is noted at the left lung base. The lungs are otherwise clear. There is no evidence of focal consolidation, pleural effusion or pneumothorax. No masses are identified. Diffuse coronary artery calcifications are seen. A small to moderate hiatal hernia is noted. No mediastinal lymphadenopathy is seen. No pericardial effusion is identified. The great vessels are grossly unremarkable. A right-sided PICC is noted ending about the distal SVC. The thyroid gland is diminutive and grossly unremarkable. No axillary lymphadenopathy is seen. No acute osseous abnormalities are identified. CT ABDOMEN AND PELVIS The liver and spleen are unremarkable in appearance. The gallbladder is within normal limits. The pancreas and adrenal glands are unremarkable. Left-sided renal atrophy is noted, with likely left-sided renal cysts and scattered parenchymal calcifications at the lower pole of the left kidney. The right kidney is markedly atrophic. There is no evidence of hydronephrosis. No renal or ureteral stones are identified. No free fluid is identified. The small bowel is unremarkable in appearance. The stomach is otherwise within normal limits. No acute vascular abnormalities are seen. Scattered calcification is noted along the  abdominal aorta. The appendix is not definitely characterized; there is no evidence of appendicitis. The colon is unremarkable in appearance. The bladder is mildly distended and grossly unremarkable. The prostate remains normal in size. No inguinal lymphadenopathy is seen. No acute osseous abnormalities are identified. There is chronic loss of height involving vertebral body L1. IMPRESSION: 1. No acute abnormality seen within the abdomen or pelvis. 2. Mild left basilar atelectasis or scarring. Lungs otherwise clear. 3. Diffuse coronary artery calcifications seen. 4. Small to moderate hiatal hernia noted. 5. Left-sided renal atrophy, with likely left-sided renal cysts and parenchymal  calcifications at the lower pole of the left kidney. Marked right renal atrophy. 6. Scattered calcification along the abdominal aorta. 7. Chronic loss of height involving vertebral body L1. Electronically Signed   By: Garald Balding M.D.   On: 08/06/2015 01:09     Medications:   . sodium chloride 100 mL/hr at 08/07/15 1210   . amLODipine  5 mg Oral Daily  . calcitRIOL  0.25 mcg Oral Daily  . cefTRIAXone (ROCEPHIN)  IV  1 g Intravenous Q24H  . cefUROXime  250 mg Oral BID WC  . insulin aspart  0-5 Units Subcutaneous QHS  . insulin aspart  0-9 Units Subcutaneous TID WC  . insulin glargine  12 Units Subcutaneous QHS  . insulin starter kit- pen needles  1 kit Other Once  . levothyroxine  125 mcg Oral QAC breakfast  . patiromer  8.4 g Oral Daily  . predniSONE  40 mg Oral Q breakfast  . sodium bicarbonate  1,300 mg Oral BID   acetaminophen **OR** acetaminophen, albuterol, bisacodyl, iohexol, morphine injection, ondansetron **OR** ondansetron (ZOFRAN) IV, oxyCODONE, polyethylene glycol  Assessment/ Plan:  82 y.o. 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 Chronic kidney disease stage IV/congenitally absent right kidney/proteinuria.  2. Hyperkalemia 3. Diabetes with CKD 4. Proteinuria  Plan: Low potassium diet Patient may have hyporeninemic state from long-standing diabetes - Trial of Veltassa to control potassium - avoid ACE inhibitor/ARB due to hyperkalemia - f/u outpatient    LOS: 1 Alejandro Armstrong 2/21/20173:01 PM

## 2015-08-07 NOTE — Plan of Care (Signed)
Problem: Food- and Nutrition-Related Knowledge Deficit (NB-1.1) Goal: Nutrition education Formal process to instruct or train a patient/client in a skill or to impart knowledge to help patients/clients voluntarily manage or modify food choices and eating behavior to maintain or improve health. Outcome: Completed/Met Date Met:  08/07/15   Initial Nutrition Assessment   INTERVENTION:   Meals and Snacks: Cater to patient preferences on Renal/Carb Modified diet order per Nephrology Education:  RD provided "Carbohydrate Counting for People with Diabetes" handout from the [employer] to pt's wife. Discussed different food groups and their effects on blood sugar, emphasizing carbohydrate-containing foods. Provided list of carbohydrates and recommended serving sizes of common foods. RD enforced plate method with diet soda, less simple sugars as a starting point. Also stressed not skipping meals as pt started on insulin regimen. Discussed importance of controlled and consistent carbohydrate intake throughout the day. Provided examples of ways to balance meals/snacks and encouraged intake of high-fiber, whole grain complex carbohydrates. Teach back method used. Expect good compliance.   NUTRITION DIAGNOSIS:   Food and nutrition related knowledge deficit related to chronic illness as evidenced by  (diet consult for education).  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 Pt readmitted with n/v and pain with acute renal failure, hyperglycemia and UTI. Pt with h/o CKD stage IV, congenitally absent right kidney, proteinuria. Pt on isolation as enteric precaution.    Past Medical History  Diagnosis Date  . Hyperlipidemia    .  Hypertension    . Hypothyroidism    . Type 2 diabetes mellitus ([place])    . Renal agenesis        discovered at age 28  . Recurrent UTI    . Ureteral stricture      Diet Order:  Diet renal/carb modified with fluid restriction Diet-HS Snack?: Nothing; Room service appropriate?: Yes; Fluid consistency:: Thin; Fluid restriction:: 2000 mL Fluid    Current Nutrition: Pt very picky, ate bites of breakfast bread mostly.   Food/Nutrition-Related History: Per wife pt eats cookies, but not much candy or other sweets. Pt drinks [place] Dry ginger ale and sweet tea on occasion. Pt likes potatoes with meals.   Scheduled Medications:  . amLODipine  5 mg Oral Daily  . calcitRIOL  0.25 mcg Oral Daily  . cefTRIAXone (ROCEPHIN)  IV  1 g Intravenous Q24H  . insulin aspart  0-5 Units Subcutaneous QHS  . insulin aspart  0-9 Units Subcutaneous TID WC  . insulin starter kit- pen needles  1 kit Other Once  . levothyroxine  125 mcg Oral QAC breakfast  . living well with diabetes book   Does not apply Once  . patiromer  8.4 g Oral Daily  . predniSONE  40 mg Oral Q breakfast  . sodium bicarbonate  1,300 mg Oral BID    Continuous Medications:  . sodium chloride 100 mL/hr at 08/07/15 0158     Electrolyte/Renal Profile and Glucose Profile:   Recent Labs Lab 08/05/15 2114 08/06/15 0356 08/06/15 1435 08/07/15 0426  NA 131* 138  --  140  K 5.9* 5.6* 5.9* 5.3*  CL 104 111  --  117*  CO2 15* 18*  --  18*  BUN 73* 69*  --  58*  CREATININE 3.15* 2.79*  --  2.46*  CALCIUM 7.9* 7.8*  --  8.0*  GLUCOSE 518* 315*  --  196*   Protein Profile:  Recent Labs Lab 08/05/15 2114  ALBUMIN 3.5    Gastrointestinal Profile: Last BM:  08/06/2015   Nutrition-Focused Physical Exam Findings:  Unable to complete Nutrition-Focused physical exam at this time.    Weight Change: Per [facility] weight encounters pt weight stable.  Height:     Ht Readings from Last 1 Encounters:  08/05/15 _0  (1.702 m)     Weight:     Wt Readings from Last 1 Encounters:  08/07/15 182 lb 6.4 oz (82.736 kg)     Wt Readings from Last 10 Encounters:  08/07/15 182 lb 6.4 oz (82.736 kg)  08/03/15 181 lb 14.4 oz (82.509 kg)  08/02/15 186 lb 1.1 oz (84.4 kg)  07/31/15 183 lb 10.3 oz (83.3 kg)  07/24/15 176 lb 12.8 oz (80.196 kg)  07/24/15 181 lb (82.1 kg)    BMI:  Body mass index is 28.56 kg/(m^2).  Estimated Nutritional Needs:   Kcal:  BEE: 1478kcals, TEE: (IF 1.1-1.3)(AF 1.2) 1951-2307kcals  Protein:  83-100g protein (1.0-1.2g/kg)  Fluid:  2075-2473m of fluid (25-352mkg)  EDUCATION NEEDS:   Education needs addressed   MOAnnvilleRD, LDN Pager ([phone]/On-Call Pager ([phone]

## 2015-08-07 NOTE — Progress Notes (Signed)
Inpatient Diabetes Program Recommendations  AACE/ADA: New Consensus Statement on Inpatient Glycemic Control (2015)  Target Ranges:  Prepandial:   less than 140 mg/dL      Peak postprandial:   less than 180 mg/dL (1-2 hours)      Critically ill patients:  140 - 180 mg/dL   Review of Glycemic Control  Results for CHASON, SHIRKEY (MRN [id number]) as of 08/07/2015 07:53  Ref. Range 08/06/2015 08:24 08/06/2015 11:14 08/06/2015 15:22 08/06/2015 21:38 08/07/2015 07:31  Glucose-Capillary Latest Ref Range: 65-99 mg/dL 266 (H) 277 (H) 202 (H) 196 (H) 169 (H)    Admit with: Acute Renal Failure/ Metabolic Acidosis/ Hyperkalemia  History: DM, HTN, ITP  Home DM Meds: None  Current Insulin Orders: Novolog Resistant Correction Scale/ SSI (0-20 units) TID AC + HS  Blood sugars improved nicely on current Novolog correction insulin but as noted by diabetes coordinator yesterday, 4 injections per day may be overwhelming for his wife. Consider adding Lantus 12 units qhs in anticipation of discharge- this will give [place] an opportunity to see how his blood sugars will respond at home.  Gentry Fitz, RN, BA, MHA, CDE Diabetes Coordinator Inpatient Diabetes Program  [phone] (Team Pager) [phone] ([place]) 08/07/2015 7:55 AM

## 2015-08-08 ENCOUNTER — Other Ambulatory Visit: Payer: Self-pay | Admitting: *Deleted

## 2015-08-08 LAB — BASIC METABOLIC PANEL
Anion gap: 6 (ref 5–15)
BUN: 51 mg/dL — AB (ref 6–20)
CHLORIDE: 116 mmol/L — AB (ref 101–111)
CO2: 17 mmol/L — AB (ref 22–32)
CREATININE: 2.29 mg/dL — AB (ref 0.61–1.24)
Calcium: 7.9 mg/dL — ABNORMAL LOW (ref 8.9–10.3)
GFR calc Af Amer: 29 mL/min — ABNORMAL LOW (ref >60)
GFR calc non Af Amer: 25 mL/min — ABNORMAL LOW (ref >60)
GLUCOSE: 155 mg/dL — AB (ref 65–99)
Potassium: 5.2 mmol/L — ABNORMAL HIGH (ref 3.5–5.1)
Sodium: 139 mmol/L (ref 135–145)

## 2015-08-08 LAB — CBC
HEMATOCRIT: 26.3 % — AB (ref 40.0–52.0)
HEMOGLOBIN: 8.8 g/dL — AB (ref 13.0–18.0)
MCH: 33.1 pg (ref 26.0–34.0)
MCHC: 33.7 g/dL (ref 32.0–36.0)
MCV: 98.5 fL (ref 80.0–100.0)
Platelets: 44 10*3/uL — ABNORMAL LOW (ref 150–440)
RBC: 2.67 MIL/uL — ABNORMAL LOW (ref 4.40–5.90)
RDW: 15.7 % — ABNORMAL HIGH (ref 11.5–14.5)
WBC: 9 10*3/uL (ref 3.8–10.6)

## 2015-08-08 LAB — GLUCOSE, CAPILLARY: Glucose-Capillary: 128 mg/dL — ABNORMAL HIGH (ref 65–99)

## 2015-08-08 MED ORDER — POLYETHYLENE GLYCOL 3350 17 G PO PACK: 17.0000 g | PACK | Freq: Every day | ORAL | Status: DC | PRN

## 2015-08-08 MED ORDER — INSULIN GLARGINE 100 UNIT/ML ~~LOC~~ SOLN: 10.0000 [IU] | Freq: Every day | SUBCUTANEOUS | Status: DC

## 2015-08-08 MED ORDER — PREDNISONE 20 MG PO TABS: 40.0000 mg | ORAL_TABLET | Freq: Every day | ORAL | Status: DC

## 2015-08-08 MED ORDER — CEFUROXIME AXETIL 250 MG PO TABS: 250.0000 mg | ORAL_TABLET | Freq: Two times a day (BID) | ORAL | Status: DC

## 2015-08-08 NOTE — Progress Notes (Signed)
Subjective:  Patient feels better today Ready for d/c K 5.2   Objective:  Vital signs in last 24 hours:  Temp:  [97.3 F (36.3 C)-98 F (36.7 C)] 97.6 F (36.4 C) (02/22 0825) Pulse Rate:  [54-67] 67 (02/22 0825) Resp:  [18] 18 (02/22 0825) BP: (120-132)/(68-84) 132/84 mmHg (02/22 0825) SpO2:  [94 %-95 %] 94 % (02/22 0825) Weight:  [82.827 kg (182 lb 9.6 oz)] 82.827 kg (182 lb 9.6 oz) (02/22 0300)  Weight change: 0.091 kg (3.2 oz) Filed Weights   08/06/15 0500 08/07/15 0453 08/08/15 0300  Weight: 84.006 kg (185 lb 3.2 oz) 82.736 kg (182 lb 6.4 oz) 82.827 kg (182 lb 9.6 oz)    Intake/Output:    Intake/Output Summary (Last 24 hours) at 08/08/15 1308 Last data filed at 08/08/15 0800  Gross per 24 hour  Intake 491.67 ml  Output   1510 ml  Net -1018.33 ml     Physical Exam: General: No acute distress,    HEENT Dry oral mucous membranes  Neck supple  Pulm/lungs Scattered rhonchi, mild basilar crackles  CVS/Heart No rub or gallop  Abdomen:  Soft, nontender  Extremities: + some dependent edema  Neurologic: Alert, oriented  Skin: Scattered ecchymosis  Access:        Basic Metabolic Panel:   Recent Labs Lab 08/05/15 2114 08/06/15 0356 08/06/15 1435 08/07/15 0426 08/08/15 0427  NA 131* 138  --  140 139  K 5.9* 5.6* 5.9* 5.3* 5.2*  CL 104 111  --  117* 116*  CO2 15* 18*  --  18* 17*  GLUCOSE 518* 315*  --  196* 155*  BUN 73* 69*  --  58* 51*  CREATININE 3.15* 2.79*  --  2.46* 2.29*  CALCIUM 7.9* 7.8*  --  8.0* 7.9*     CBC:  Recent Labs Lab 08/02/15 1308 08/04/15 0539 08/05/15 2114 08/06/15 0356 08/07/15 0426 08/08/15 0427  WBC 12.2* 7.8 22.8* 15.1* 6.2 9.0  NEUTROABS 11.1*  --   --   --   --   --   HGB 11.0* 8.0* 9.9* 9.8* 8.6* 8.8*  HCT 31.3* 23.2* 29.4* 28.7* 25.6* 26.3*  MCV 95.4 97.4 96.8 98.0 98.5 98.5  PLT 10* 72* 65* 45* 34* 44*      Microbiology:  Recent Results (from the past 720 hour(s))  Urine culture     Status: None   Collection Time: 08/05/15  9:14 PM  Result Value Ref Range Status   Specimen Description URINE, RANDOM  Final   Special Requests NONE  Final   Culture 1,000 COLONIES/mL INSIGNIFICANT GROWTH  Final   Report Status 08/07/2015 FINAL  Final  C difficile quick scan w PCR reflex     Status: None   Collection Time: 08/06/15  2:35 AM  Result Value Ref Range Status   C Diff antigen NEGATIVE NEGATIVE Final   C Diff toxin NEGATIVE NEGATIVE Final   C Diff interpretation Negative for C. difficile  Final    Coagulation Studies: No results for input(s): LABPROT, INR in the last 72 hours.  Urinalysis:  Recent Labs  08/05/15 2114  COLORURINE STRAW*  LABSPEC 1.014  PHURINE 6.0  GLUCOSEU >500*  HGBUR 1+*  BILIRUBINUR NEGATIVE  KETONESUR NEGATIVE  PROTEINUR 100*  NITRITE NEGATIVE  LEUKOCYTESUR TRACE*      Imaging: No results found.   Medications:   . sodium chloride 100 mL/hr at 08/08/15 0816   . amLODipine  5 mg Oral Daily  . calcitRIOL  0.25 mcg Oral Daily  . cefUROXime  250 mg Oral BID WC  . insulin aspart  0-5 Units Subcutaneous QHS  . insulin aspart  0-9 Units Subcutaneous TID WC  . insulin glargine  12 Units Subcutaneous QHS  . insulin starter kit- pen needles  1 kit Other Once  . levothyroxine  125 mcg Oral QAC breakfast  . patiromer  8.4 g Oral Daily  . predniSONE  40 mg Oral Q breakfast  . sodium bicarbonate  1,300 mg Oral BID   acetaminophen **OR** acetaminophen, albuterol, bisacodyl, iohexol, morphine injection, ondansetron **OR** ondansetron (ZOFRAN) IV, oxyCODONE, polyethylene glycol  Assessment/ Plan:  82 y.o. 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 Chronic kidney disease stage IV/congenitally  absent right kidney/proteinuria.  2. Hyperkalemia 3. Diabetes with CKD 4. Proteinuria  Plan: Low potassium diet Patient may have hyporeninemic state from long-standing diabetes - Trial of Veltassa to control potassium- will f/u on this outpatient - avoid ACE inhibitor/ARB due to hyperkalemia      LOS: 2 Alejandro Armstrong 2/22/20171:08 PM

## 2015-08-08 NOTE — Progress Notes (Signed)
Discharge instructions given.  Prescriptions called in to patient's pharmacy and family notified to pick up prescriptions.  VSS.  PICC line dressing intact.  Patient taken to visitors entrance via wheelchair to be taken home in personal vehicle by wife.

## 2015-08-08 NOTE — Progress Notes (Signed)
Patient ID: Alejandro Armstrong, male   DOB: Dec 07, 1929, 82 y.o.   MRN: [id number]  Pt does not want any diabetes education since he is aware of it. This message was relayed to me by RN

## 2015-08-08 NOTE — Discharge Summary (Signed)
[facility] [place] NAME: Alejandro Armstrong    MR#:  [id number]  DATE OF BIRTH:  Sep 19, 1929  DATE OF ADMISSION:  08/05/2015 ADMITTING PHYSICIAN: Hillary Bow, MD  DATE OF DISCHARGE: 08/08/15  PRIMARY CARE PHYSICIAN: Casilda Carls, MD    ADMISSION DIAGNOSIS:  Hyperkalemia [E87.5] Leukocytosis [D72.829] Hyperglycemia [R73.9] Elevated troponin [R79.89] Chronic renal insufficiency, unspecified stage [N18.9] Acute pancreatitis, unspecified pancreatitis type [K85.9]  DISCHARGE DIAGNOSIS:  Acute on chronic renal failure stage IV Hyperkalemia-patient received Veltassa in the hospital  UTI 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 Grayland Ormond Type 2 diabetes now on insulin  SECONDARY DIAGNOSIS:   Past Medical History  Diagnosis Date  . Hyperlipidemia   . Hypertension   . Hypothyroidism   . Type 2 diabetes mellitus ([place])   . Renal agenesis     discovered at age 57  . Recurrent UTI   . Ureteral stricture     HOSPITAL COURSE:    * UTI with leukocytosis of 22,000.--- 15,000 His vomiting is likely due to UTI-resolved able to tolerate by mouth diet  IV ceftriaxone--- changed to by mouth Ceftin and insignificant culture results.  His leukocytosis could also be due to prednisone.  * Acute on chronic renal failure  with hyperkalemia and acidosis -Patient has stage IV chronic kidney disease  Due to dehydration from vomiting and hyperglycemia. -Creatinine stable at 2.29 Creatinine is close to baseline but his BUN is significantly elevated. -Received IV  fluids. Also has metabolic acidosis. On sodium bicarbonate pills. -Patient was on  VELTASSA for hyperkalemia now off of it  * Uncontrolled diabetes mellitus Due to high-dose prednisone. Prednisone needs to be continued for his ITP. Placed him on sliding scale of insulin. And insulin Lantus 10 units daily   * Dehydration Due to vomiting and  hyperglycemia. Resolved  * ITP Improving with prednisone. Patient received 2 doses of Rituxan in the hospital on 2/17 and 2/18. He was discharged at Plt >70 - Now improved to 44,000. No evidence of active bleeding. Okay from oncology standpoint with Dr. Grayland Ormond for patient to go home and follow up outpatient for Rituxan treatment no indication for platelet transfusion at present  * DVT prophylaxis with SCDs. No heparin or Lovenox due to thrombocytopenia.  Discussed with nephrology and oncology okay for patient to go home. Patient ambulated in the hallways with staff. Will discharge to home. Wife and patient okay with discharge plan.  CONSULTS OBTAINED:  Treatment Team:  Murlean Iba, MD [name], MD  DRUG ALLERGIES:  No Known Allergies  DISCHARGE MEDICATIONS:   Current Discharge Medication List    START taking these medications   Details  cefUROXime (CEFTIN) 250 MG tablet Take 1 tablet (250 mg total) by mouth 2 (two) times daily with a meal. Qty: 10 tablet, Refills: 0    insulin glargine (LANTUS) 100 UNIT/ML injection Inject 0.1 mLs (10 Units total) into the skin at bedtime. Qty: 10 mL, Refills: 11    polyethylene glycol (MIRALAX / GLYCOLAX) packet Take 17 g by mouth daily as needed for mild constipation. Qty: 14 each, Refills: 0      CONTINUE these medications which have CHANGED   Details  predniSONE (DELTASONE) 20 MG tablet Take 2 tablets (40 mg total) by mouth daily with breakfast. Qty: 30 tablet, Refills: 0   Associated Diagnoses: ITP (idiopathic thrombocytopenic purpura)      CONTINUE these medications which have NOT CHANGED   Details  amLODipine (NORVASC)  5 MG tablet Take 5 mg by mouth.    calcitRIOL (ROCALTROL) 0.25 MCG capsule Take 0.25 mcg by mouth daily.    cholecalciferol (VITAMIN D) 1000 units tablet Take 1,000 Units by mouth daily.    ferrous sulfate 325 (65 FE) MG tablet Take 325 mg by mouth.    levothyroxine (SYNTHROID, LEVOTHROID) 125  MCG tablet     lisinopril (PRINIVIL,ZESTRIL) 20 MG tablet Take 20 mg by mouth.    Omega-3 1000 MG CAPS Take by mouth.    sodium bicarbonate 650 MG tablet         If you experience worsening of your admission symptoms, develop shortness of breath, life threatening emergency, suicidal or homicidal thoughts you must seek medical attention immediately by calling 911 or calling your MD immediately  if symptoms less severe.  You Must read complete instructions/literature along with all the possible adverse reactions/side effects for all the Medicines you take and that have been prescribed to you. Take any new Medicines after you have completely understood and accept all the possible adverse reactions/side effects.   Please note  You were cared for by a hospitalist during your hospital stay. If you have any questions about your discharge medications or the care you received while you were in the hospital after you are discharged, you can call the unit and asked to speak with the hospitalist on call if the hospitalist that took care of you is not available. Once you are discharged, your primary care physician will handle any further medical issues. Please note that NO REFILLS for any discharge medications will be authorized once you are discharged, as it is imperative that you return to your primary care physician (or establish a relationship with a primary care physician if you do not have one) for your aftercare needs so that they can reassess your need for medications and monitor your lab values. Today   SUBJECTIVE   I am feeling fine and ready to go home    VITAL SIGNS:  Blood pressure 132/84, pulse 67, temperature 97.6 F (36.4 C), temperature source Oral, resp. rate 18, height 5\' 7"  (1.702 m), weight 82.827 kg (182 lb 9.6 oz), SpO2 94 %.  I/O:   Intake/Output Summary (Last 24 hours) at 08/08/15 0956 Last data filed at 08/08/15 0800  Gross per 24 hour  Intake 866.67 ml  Output   1510  ml  Net -643.33 ml    PHYSICAL EXAMINATION:  GENERAL:  82 y.o.-year-old patient lying in the bed with no acute distress.  EYES: Pupils equal, round, reactive to light and accommodation. No scleral icterus. Extraocular muscles intact.  HEENT: Head atraumatic, normocephalic. Oropharynx and nasopharynx clear.  NECK:  Supple, no jugular venous distention. No thyroid enlargement, no tenderness.  LUNGS: Normal breath sounds bilaterally, no wheezing, rales,rhonchi or crepitation. No use of accessory muscles of respiration.  CARDIOVASCULAR: S1, S2 normal. No murmurs, rubs, or gallops.  ABDOMEN: Soft, non-tender, non-distended. Bowel sounds present. No organomegaly or mass.  EXTREMITIES: No pedal edema, cyanosis, or clubbing.  NEUROLOGIC: Cranial nerves II through XII are intact. Muscle strength 5/5 in all extremities. Sensation intact. Gait not checked.  PSYCHIATRIC: The patient is alert and oriented x 3.  SKIN: No obvious rash, lesion, or ulcer.   DATA REVIEW:   CBC   Recent Labs Lab 08/08/15 0427  WBC 9.0  HGB 8.8*  HCT 26.3*  PLT 44*    Chemistries   Recent Labs Lab 08/05/15 2114  08/08/15 0427  NA 131*  < >  139  K 5.9*  < > 5.2*  CL 104  < > 116*  CO2 15*  < > 17*  GLUCOSE 518*  < > 155*  BUN 73*  < > 51*  CREATININE 3.15*  < > 2.29*  CALCIUM 7.9*  < > 7.9*  AST 27  --   --   ALT 25  --   --   ALKPHOS 121  --   --   BILITOT 0.9  --   --   < > = values in this interval not displayed.  Microbiology Results   Recent Results (from the past 240 hour(s))  Urine culture     Status: None   Collection Time: 08/05/15  9:14 PM  Result Value Ref Range Status   Specimen Description URINE, RANDOM  Final   Special Requests NONE  Final   Culture 1,000 COLONIES/mL INSIGNIFICANT GROWTH  Final   Report Status 08/07/2015 FINAL  Final  C difficile quick scan w PCR reflex     Status: None   Collection Time: 08/06/15  2:35 AM  Result Value Ref Range Status   C Diff antigen  NEGATIVE NEGATIVE Final   C Diff toxin NEGATIVE NEGATIVE Final   C Diff interpretation Negative for C. difficile  Final    RADIOLOGY:  No results found.   Management plans discussed with the patient, family and they are in agreement.  CODE STATUS:     Code Status Orders        Start     Ordered   08/06/15 0141  Full code   Continuous     08/06/15 0141    Code Status History    Date Active Date Inactive Code Status Order ID Comments User Context   08/03/2015  2:23 PM 08/04/2015  1:52 PM Full Code [id number]  [name], MD Inpatient   07/24/2015 12:35 PM 07/29/2015  5:12 PM Full Code [id number]  Lytle Butte, MD Inpatient    Advance Directive Documentation        Most Recent Value   Type of Advance Directive  Living will   Pre-existing out of facility DNR order (yellow form or pink MOST form)     "MOST" Form in Place?        TOTAL TIME TAKING CARE OF THIS PATIENT: 40 minutes.    Jaydy Fitzhenry M.D on 08/08/2015 at 9:56 AM  Between 7am to 6pm - Pager - [phone] After 6pm go to [email or web address] - password [email or web address] [facility]  Office  [phone]  CC: Primary care physician; Casilda Carls, MD

## 2015-08-08 NOTE — Discharge Instructions (Signed)
Dr. Grayland Ormond will see you in office tomorrow 08/09/2015 for ITP and rituxan shot.  Dr. Grayland Ormond will set up home health at that time to maintain PICC line in right upper arm.

## 2015-08-08 NOTE — Progress Notes (Signed)
Dr. Posey Pronto asked that nurse page Dr. Grayland Ormond about PICC line.  Nurse spoke with Dr. Grayland Ormond and MD verbalized that he will be seeing patient in his office for ITP and rituxan shot and to please leave PICC line in place.  Dr. Grayland Ormond verbalized that he would have his office nurse to take care of PICC line and during that appointment will set up home health to maintain PICC line at home. Family informed of this information.

## 2015-08-08 NOTE — Progress Notes (Signed)
Discharge education started for SQ insulin administration.  Patient and wife refused education and stated "we have a neighbor to help [place]".  Nurse attempted to ask question to further assess patient and wife's understanding of diabetes, checking blood glucose level, carb counting, and administered SQ insulin, however patient stated "I'm ready to go" and wife again stated "we have a neighbor that is willing to help [place] if we have discharge instructions".

## 2015-08-08 NOTE — Progress Notes (Signed)
Nurse attempted to take PICC line out as per Dr. Serita Grit orders.  Patient refused stating "Dr. Grayland Ormond said I would need PICC line for a while".  Dr. Posey Pronto paged to verify information before discontinuing PICC.

## 2015-08-08 NOTE — Care Management Important Message (Signed)
Important Message  Patient Details  Name: Alejandro Armstrong MRN: [id number] Date of Birth: 1929/08/02   Medicare Important Message Given:  Yes    Saoirse Legere A, RN 08/08/2015, 8:24 AM

## 2015-08-09 ENCOUNTER — Inpatient Hospital Stay (HOSPITAL_BASED_OUTPATIENT_CLINIC_OR_DEPARTMENT_OTHER): Payer: Medicare HMO | Admitting: Oncology

## 2015-08-09 ENCOUNTER — Inpatient Hospital Stay: Payer: Medicare HMO

## 2015-08-09 ENCOUNTER — Other Ambulatory Visit: Payer: Self-pay | Admitting: Oncology

## 2015-08-09 VITALS — BP 149/81 | HR 62 | Temp 97.4°F | Resp 16 | Wt 184.7 lb

## 2015-08-09 VITALS — BP 153/67 | HR 64 | Temp 97.6°F

## 2015-08-09 DIAGNOSIS — R5383 Other fatigue: Secondary | ICD-10-CM | POA: Diagnosis not present

## 2015-08-09 DIAGNOSIS — N281 Cyst of kidney, acquired: Secondary | ICD-10-CM

## 2015-08-09 DIAGNOSIS — Z87440 Personal history of urinary (tract) infections: Secondary | ICD-10-CM

## 2015-08-09 DIAGNOSIS — E1122 Type 2 diabetes mellitus with diabetic chronic kidney disease: Secondary | ICD-10-CM

## 2015-08-09 DIAGNOSIS — T380X5A Adverse effect of glucocorticoids and synthetic analogues, initial encounter: Secondary | ICD-10-CM | POA: Diagnosis not present

## 2015-08-09 DIAGNOSIS — I129 Hypertensive chronic kidney disease with stage 1 through stage 4 chronic kidney disease, or unspecified chronic kidney disease: Secondary | ICD-10-CM

## 2015-08-09 DIAGNOSIS — I2510 Atherosclerotic heart disease of native coronary artery without angina pectoris: Secondary | ICD-10-CM

## 2015-08-09 DIAGNOSIS — D693 Immune thrombocytopenic purpura: Secondary | ICD-10-CM

## 2015-08-09 DIAGNOSIS — E039 Hypothyroidism, unspecified: Secondary | ICD-10-CM | POA: Diagnosis not present

## 2015-08-09 DIAGNOSIS — Z7952 Long term (current) use of systemic steroids: Secondary | ICD-10-CM | POA: Diagnosis not present

## 2015-08-09 DIAGNOSIS — E785 Hyperlipidemia, unspecified: Secondary | ICD-10-CM

## 2015-08-09 DIAGNOSIS — E1165 Type 2 diabetes mellitus with hyperglycemia: Secondary | ICD-10-CM

## 2015-08-09 DIAGNOSIS — Q602 Renal agenesis, unspecified: Secondary | ICD-10-CM

## 2015-08-09 DIAGNOSIS — I517 Cardiomegaly: Secondary | ICD-10-CM | POA: Diagnosis not present

## 2015-08-09 DIAGNOSIS — Z794 Long term (current) use of insulin: Secondary | ICD-10-CM

## 2015-08-09 DIAGNOSIS — R531 Weakness: Secondary | ICD-10-CM | POA: Diagnosis not present

## 2015-08-09 DIAGNOSIS — J9811 Atelectasis: Secondary | ICD-10-CM | POA: Diagnosis not present

## 2015-08-09 DIAGNOSIS — Z79899 Other long term (current) drug therapy: Secondary | ICD-10-CM

## 2015-08-09 DIAGNOSIS — D696 Thrombocytopenia, unspecified: Secondary | ICD-10-CM

## 2015-08-09 DIAGNOSIS — K449 Diaphragmatic hernia without obstruction or gangrene: Secondary | ICD-10-CM | POA: Diagnosis not present

## 2015-08-09 LAB — CBC WITH DIFFERENTIAL/PLATELET
BASOS ABS: 0 10*3/uL (ref 0–0.1)
Basophils Relative: 0 %
EOS ABS: 0 10*3/uL (ref 0–0.7)
EOS PCT: 0 %
HEMATOCRIT: 29.7 % — AB (ref 40.0–52.0)
HEMOGLOBIN: 10.1 g/dL — AB (ref 13.0–18.0)
Lymphocytes Relative: 7 %
Lymphs Abs: 1 10*3/uL (ref 1.0–3.6)
MCH: 33 pg (ref 26.0–34.0)
MCHC: 34 g/dL (ref 32.0–36.0)
MCV: 96.9 fL (ref 80.0–100.0)
Monocytes Absolute: 1 10*3/uL (ref 0.2–1.0)
Monocytes Relative: 8 %
Neutro Abs: 11.3 10*3/uL — ABNORMAL HIGH (ref 1.4–6.5)
Neutrophils Relative %: 85 %
Platelets: 60 10*3/uL — ABNORMAL LOW (ref 150–440)
RBC: 3.06 MIL/uL — AB (ref 4.40–5.90)
RDW: 15 % — ABNORMAL HIGH (ref 11.5–14.5)
WBC: 13.3 10*3/uL — AB (ref 3.8–10.6)

## 2015-08-09 LAB — SAMPLE TO BLOOD BANK

## 2015-08-09 MED ORDER — SODIUM CHLORIDE 0.9 % IV SOLN
375.0000 mg/m2 | Freq: Once | INTRAVENOUS | Status: AC
  Administered 2015-08-09: 700 mg via INTRAVENOUS
  Filled 2015-08-09: qty 60

## 2015-08-09 MED ORDER — SODIUM CHLORIDE 0.9 % IV SOLN
Freq: Once | INTRAVENOUS | Status: AC
  Administered 2015-08-09: 11:00:00 via INTRAVENOUS
  Filled 2015-08-09: qty 1000

## 2015-08-09 MED ORDER — SODIUM CHLORIDE 0.9% FLUSH
10.0000 mL | INTRAVENOUS | Status: DC | PRN
  Administered 2015-08-09: 10 mL
  Filled 2015-08-09: qty 10

## 2015-08-09 MED ORDER — DIPHENHYDRAMINE HCL 25 MG PO CAPS
25.0000 mg | ORAL_CAPSULE | Freq: Once | ORAL | Status: AC
  Administered 2015-08-09: 25 mg via ORAL
  Filled 2015-08-09: qty 1

## 2015-08-09 MED ORDER — ACETAMINOPHEN 325 MG PO TABS
650.0000 mg | ORAL_TABLET | Freq: Once | ORAL | Status: AC
Start: 2015-08-09 — End: 2015-08-09
  Administered 2015-08-09: 650 mg via ORAL
  Filled 2015-08-09: qty 2

## 2015-08-09 MED ORDER — HEPARIN SOD (PORK) LOCK FLUSH 100 UNIT/ML IV SOLN
500.0000 [IU] | Freq: Once | INTRAVENOUS | Status: AC | PRN
  Administered 2015-08-09: 500 [IU]
  Filled 2015-08-09: qty 10

## 2015-08-09 MED ORDER — SODIUM CHLORIDE 0.9 % IV SOLN: 375.0000 mg/m2 | Freq: Once | INTRAVENOUS | Status: DC

## 2015-08-09 NOTE — Progress Notes (Signed)
Patient arrived with double lumen PICC line present in right upper arm.  2/3 of inner arm bruised.  PICC line placed in hospital when platelet level was low. Area surrounding insertion site was swollen with dried and fresh blood present.  Patient stated that area was not tender to the touch and patient was afebrile.  Dr. Grayland Ormond contacted regarded appearance of the PICC line and he stated that patient could go ahead with treatment. Both lumens flushed with normal saline and blood return noted in both lumens.  Bandage was changed, it appeared that line had migrated out from its original insertion site, Dr. Grayland Ormond contacted and he stated that I should still continue on with treatment.  Both lumens flushed and blood return checked prior to infusion.  Patient instructed to seek medical attention is site should begin to bleed to the point where bandage did not contain blood.  Also instructed to contact Dr. If he developed a temperature above 101.0

## 2015-08-09 NOTE — Progress Notes (Signed)
Patient is having leg weakness this morning.  After taking an insulin dose last night he started having palpitations and feeling weak.  The palpitations are better this morning.  While in the hospital he had a PICC line placed in his right arm.

## 2015-08-09 NOTE — Progress Notes (Signed)
[place] [place]  [phone] [phone]  ID: [name] OB: 1930/06/04  MR#: [id number]  IWO#:[id number]  Patient Care Team: Casilda Carls, MD as PCP - General (Internal Medicine)  CHIEF COMPLAINT:  Chief Complaint  Patient presents with  . ITP    INTERVAL HISTORY: Patient returns to clinic today for repeat laboratory work and further evaluation. He was discharged from the hospital yesterday after being treated for hyperglycemia. He is on insulin but he and his wife are struggling with administration. Other than fatigue, he feels well and is asymptomatic. He denies any further bleeding, but continues to have easy bruising. He has no neurologic complaints. He denies any fevers. He has a good appetite and denies weight loss. He denies any chest pain or shortness of breath. He denies any nausea, vomiting, constipation, or diarrhea. He has no urinary complaints. Patient otherwise feels well and offers no further specific complaints.   REVIEW OF SYSTEMS:   Review of Systems  Constitutional: Positive for malaise/fatigue. Negative for fever and weight loss.  Cardiovascular: Negative.   Gastrointestinal: Negative.  Negative for blood in stool and melena.  Genitourinary: Negative.   Musculoskeletal: Negative.   Neurological: Positive for weakness.  Endo/Heme/Allergies: Bruises/bleeds easily.    As per HPI. Otherwise, a complete review of systems is negatve.  PAST MEDICAL HISTORY: Past Medical History  Diagnosis Date  . Hyperlipidemia   . Hypertension   . Hypothyroidism   . Type 2 diabetes mellitus ([place])   . Renal agenesis     discovered at age 77  . Recurrent UTI   . Ureteral stricture     PAST SURGICAL HISTORY: No past surgical history on file.  FAMILY HISTORY: No reported history of malignancy or chronic disease.      ADVANCED DIRECTIVES:    HEALTH MAINTENANCE: Social History  Substance Use Topics  . Smoking status: Never  Smoker   . Smokeless tobacco: Never Used  . Alcohol Use: No     Colonoscopy:  PAP:  Bone density:  Lipid panel:  No Known Allergies  Current Outpatient Prescriptions  Medication Sig Dispense Refill  . amLODipine (NORVASC) 5 MG tablet Take 5 mg by mouth.    . calcitRIOL (ROCALTROL) 0.25 MCG capsule Take 0.25 mcg by mouth daily.    . cefUROXime (CEFTIN) 250 MG tablet Take 1 tablet (250 mg total) by mouth 2 (two) times daily with a meal. 10 tablet 0  . cholecalciferol (VITAMIN D) 1000 units tablet Take 1,000 Units by mouth daily.    . ferrous sulfate 325 (65 FE) MG tablet Take 325 mg by mouth.    . insulin glargine (LANTUS) 100 UNIT/ML injection Inject 0.1 mLs (10 Units total) into the skin at bedtime. 10 mL 11  . levothyroxine (SYNTHROID, LEVOTHROID) 125 MCG tablet     . lisinopril (PRINIVIL,ZESTRIL) 20 MG tablet Take 20 mg by mouth.    . Omega-3 1000 MG CAPS Take by mouth.    . polyethylene glycol (MIRALAX / GLYCOLAX) packet Take 17 g by mouth daily as needed for mild constipation. 14 each 0  . predniSONE (DELTASONE) 20 MG tablet Take 2 tablets (40 mg total) by mouth daily with breakfast. 30 tablet 0  . sodium bicarbonate 650 MG tablet      No current facility-administered medications for this visit.    OBJECTIVE: Filed Vitals:   08/09/15 0915  BP: 149/81  Pulse: 62  Temp: 97.4 F (36.3 C)  Resp: 16     Body  mass index is 28.93 kg/(m^2).    ECOG FS:0 - Asymptomatic  General: Well-developed, well-nourished, no acute distress. Eyes: Pink conjunctiva, anicteric sclera. Lungs: Clear to auscultation bilaterally. Heart: Regular rate and rhythm. No rubs, murmurs, or gallops. Abdomen: Soft, nontender, nondistended. No organomegaly noted, normoactive bowel sounds. Musculoskeletal: No edema, cyanosis, or clubbing. Neuro: Alert, answering all questions appropriately. Cranial nerves grossly intact. Skin: No rashes or petechiae noted. Mild bruising noted on extremities. Psych:  Normal affect. Lymphatics: No cervical, calvicular, axillary or inguinal LAD.   LAB RESULTS:  Lab Results  Component Value Date   NA 139 08/08/2015   K 5.2* 08/08/2015   CL 116* 08/08/2015   CO2 17* 08/08/2015   GLUCOSE 155* 08/08/2015   BUN 51* 08/08/2015   CREATININE 2.29* 08/08/2015   CALCIUM 7.9* 08/08/2015   PROT 6.5 08/05/2015   ALBUMIN 3.5 08/05/2015   AST 27 08/05/2015   ALT 25 08/05/2015   ALKPHOS 121 08/05/2015   BILITOT 0.9 08/05/2015   GFRNONAA 25* 08/08/2015   GFRAA 29* 08/08/2015    Lab Results  Component Value Date   WBC 13.3* 08/09/2015   NEUTROABS 11.3* 08/09/2015   HGB 10.1* 08/09/2015   HCT 29.7* 08/09/2015   MCV 96.9 08/09/2015   PLT 60* 08/09/2015     STUDIES: Ct Abdomen Pelvis Wo Contrast  08/06/2015  CLINICAL DATA:  Acute onset of vomiting and tachycardia. Idiopathic thrombocytopenic purpura. Initial encounter. EXAM: CT CHEST, ABDOMEN AND PELVIS WITHOUT CONTRAST TECHNIQUE: Multidetector CT imaging of the chest, abdomen and pelvis was performed following the standard protocol without IV contrast. COMPARISON:  Chest radiograph performed 08/03/2015 FINDINGS: CT CHEST Mild atelectasis or scarring is noted at the left lung base. The lungs are otherwise clear. There is no evidence of focal consolidation, pleural effusion or pneumothorax. No masses are identified. Diffuse coronary artery calcifications are seen. A small to moderate hiatal hernia is noted. No mediastinal lymphadenopathy is seen. No pericardial effusion is identified. The great vessels are grossly unremarkable. A right-sided PICC is noted ending about the distal SVC. The thyroid gland is diminutive and grossly unremarkable. No axillary lymphadenopathy is seen. No acute osseous abnormalities are identified. CT ABDOMEN AND PELVIS The liver and spleen are unremarkable in appearance. The gallbladder is within normal limits. The pancreas and adrenal glands are unremarkable. Left-sided renal atrophy is  noted, with likely left-sided renal cysts and scattered parenchymal calcifications at the lower pole of the left kidney. The right kidney is markedly atrophic. There is no evidence of hydronephrosis. No renal or ureteral stones are identified. No free fluid is identified. The small bowel is unremarkable in appearance. The stomach is otherwise within normal limits. No acute vascular abnormalities are seen. Scattered calcification is noted along the abdominal aorta. The appendix is not definitely characterized; there is no evidence of appendicitis. The colon is unremarkable in appearance. The bladder is mildly distended and grossly unremarkable. The prostate remains normal in size. No inguinal lymphadenopathy is seen. No acute osseous abnormalities are identified. There is chronic loss of height involving vertebral body L1. IMPRESSION: 1. No acute abnormality seen within the abdomen or pelvis. 2. Mild left basilar atelectasis or scarring. Lungs otherwise clear. 3. Diffuse coronary artery calcifications seen. 4. Small to moderate hiatal hernia noted. 5. Left-sided renal atrophy, with likely left-sided renal cysts and parenchymal calcifications at the lower pole of the left kidney. Marked right renal atrophy. 6. Scattered calcification along the abdominal aorta. 7. Chronic loss of height involving vertebral body L1. Electronically Signed  By: Garald Balding M.D.   On: 08/06/2015 01:09   Ct Chest Wo Contrast  08/06/2015  CLINICAL DATA:  Acute onset of vomiting and tachycardia. Idiopathic thrombocytopenic purpura. Initial encounter. EXAM: CT CHEST, ABDOMEN AND PELVIS WITHOUT CONTRAST TECHNIQUE: Multidetector CT imaging of the chest, abdomen and pelvis was performed following the standard protocol without IV contrast. COMPARISON:  Chest radiograph performed 08/03/2015 FINDINGS: CT CHEST Mild atelectasis or scarring is noted at the left lung base. The lungs are otherwise clear. There is no evidence of focal  consolidation, pleural effusion or pneumothorax. No masses are identified. Diffuse coronary artery calcifications are seen. A small to moderate hiatal hernia is noted. No mediastinal lymphadenopathy is seen. No pericardial effusion is identified. The great vessels are grossly unremarkable. A right-sided PICC is noted ending about the distal SVC. The thyroid gland is diminutive and grossly unremarkable. No axillary lymphadenopathy is seen. No acute osseous abnormalities are identified. CT ABDOMEN AND PELVIS The liver and spleen are unremarkable in appearance. The gallbladder is within normal limits. The pancreas and adrenal glands are unremarkable. Left-sided renal atrophy is noted, with likely left-sided renal cysts and scattered parenchymal calcifications at the lower pole of the left kidney. The right kidney is markedly atrophic. There is no evidence of hydronephrosis. No renal or ureteral stones are identified. No free fluid is identified. The small bowel is unremarkable in appearance. The stomach is otherwise within normal limits. No acute vascular abnormalities are seen. Scattered calcification is noted along the abdominal aorta. The appendix is not definitely characterized; there is no evidence of appendicitis. The colon is unremarkable in appearance. The bladder is mildly distended and grossly unremarkable. The prostate remains normal in size. No inguinal lymphadenopathy is seen. No acute osseous abnormalities are identified. There is chronic loss of height involving vertebral body L1. IMPRESSION: 1. No acute abnormality seen within the abdomen or pelvis. 2. Mild left basilar atelectasis or scarring. Lungs otherwise clear. 3. Diffuse coronary artery calcifications seen. 4. Small to moderate hiatal hernia noted. 5. Left-sided renal atrophy, with likely left-sided renal cysts and parenchymal calcifications at the lower pole of the left kidney. Marked right renal atrophy. 6. Scattered calcification along the  abdominal aorta. 7. Chronic loss of height involving vertebral body L1. Electronically Signed   By: Garald Balding M.D.   On: 08/06/2015 01:09   Ct Biopsy  07/25/2015  CLINICAL DATA:  82 year old male with thrombocytopenia EXAM: CT-GUIDED BIOPSY BONE MARROW MEDICATIONS AND MEDICAL HISTORY: Versed 1.0 mg, Fentanyl 0 mcg. Additional Medications: None. ANESTHESIA/SEDATION: None PROCEDURE: The procedure risks, benefits, and alternatives were explained to the patient. Questions regarding the procedure were encouraged and answered. The patient understands and consents to the procedure. Scout CT of the pelvis was performed for surgical planning purposes. The posterior pelvis was prepped with Betadinein a sterile fashion, and a sterile drape was applied covering the operative field. A sterile gown and sterile gloves were used for the procedure. Local anesthesia was provided with 1% Lidocaine. We targeted the left posterior iliac bone for biopsy. The skin and subcutaneous tissues were infiltrated with 1% lidocaine without epinephrine. A small stab incision was made with an 11 blade scalpel, and an 11 gauge Murphy needle was advanced with CT guidance to the posterior cortex. Manual forced was used to advance the needle through the posterior cortex and the stylet was removed. A bone marrow aspirate was retrieved and passed to a cytotechnologist in the room. The Murphy needle was then advanced without the stylet for  a core biopsy. The core biopsy was retrieved and also passed to a cytotechnologist. Manual pressure was used for hemostasis and a sterile dressing was placed. No complications were encountered no significant blood loss was encountered. Patient tolerated the procedure well and remained hemodynamically stable throughout. FINDINGS: Scout image demonstrates safe approach to posterior iliac bone. Images during the case demonstrate placement of 11 gauge Murphy needle COMPLICATIONS: None IMPRESSION: Status post CT-guided  bone marrow biopsy, with tissue specimen sent to pathology for complete histopathologic analysis Signed, Dulcy Fanny. Earleen Newport, DO [facility] Electronically Signed   By: Corrie Mckusick D.O.   On: 07/25/2015 17:08   Dg Chest Port 1 View  08/03/2015  CLINICAL DATA:  Bedside PICC placement. Recent diagnosis of idiopathic thrombocytopenic purpura. EXAM: PORTABLE CHEST 1 VIEW COMPARISON:  12/13/2010. FINDINGS: Right arm PICC tip projects over the mid SVC. Suboptimal inspiration with mild bibasilar atelectasis. Lungs otherwise clear. Cardiac silhouette mildly enlarged, unchanged. Pulmonary vascularity normal. No pleural effusions. IMPRESSION: 1. Right arm PICC tip projects over the mid SVC. 2. Stable mild cardiomegaly. Suboptimal inspiration accounts for mild bibasilar atelectasis. No acute cardiopulmonary disease otherwise. Electronically Signed   By: Evangeline Dakin M.D.   On: 08/03/2015 15:00    ASSESSMENT:  Bone marrow biopsy confirmed ITP.  PLAN:    1. ITP: Bone marrow biopsy revealed normal megakaryocytes as well as normal trilineage hematopoiesis thus suggesting peripheral destruction of his platelets.  Infectious workup did not reveal an underlying viral etiology. Continue with cycle 2 of 4 of weekly Rituxan 375 mg/m today. Prednisone dose decreased to 40 mg daily, which we will continue to taper over the next 1-2 weeks. CT scan results reviewed independently and reported as above.  Return to clinic in one week for cycle 3 of weekly rituxan, repeat laboratory work, and further evaluation. 2. Chronic renal insufficiency: Patient's creatinine is approximately his baseline. Continue follow-up with nephrology as scheduled.  3. Hyperglycemia: Secondary to high dose prednisone. Monitor. Taper prednisone as above.   Patient expressed understanding and was in agreement with this plan. He also understands that He can call clinic at any time with any  questions, concerns, or complaints.   Mayra Reel, NP   08/09/2015 9:43 AM   Patient seen and evaluated independently and I agree with the assessment and plan as dictated above.  [name], MD 08/11/2015 8:09 AM

## 2015-08-10 ENCOUNTER — Telehealth: Payer: Self-pay | Admitting: *Deleted

## 2015-08-10 NOTE — Telephone Encounter (Signed)
[facility] referral was transferred to them

## 2015-08-13 ENCOUNTER — Telehealth: Payer: Self-pay | Admitting: *Deleted

## 2015-08-13 ENCOUNTER — Other Ambulatory Visit: Payer: Self-pay | Admitting: *Deleted

## 2015-08-13 NOTE — Telephone Encounter (Signed)
opened in error

## 2015-08-13 NOTE — Telephone Encounter (Signed)
Unable to teach PICC care because there are no supplies in home to flush line. Please call

## 2015-08-13 NOTE — Telephone Encounter (Signed)
Home health was ordered.  Do they need a script for supplies?

## 2015-08-13 NOTE — Telephone Encounter (Signed)
Yes, we need to send to his pharmacy

## 2015-08-13 NOTE — Telephone Encounter (Signed)
I spoke with Alejandro Armstrong and she had me to spell Mag citrate to her and she repeated it back to me. Advised her to call back if no BM

## 2015-08-13 NOTE — Telephone Encounter (Signed)
They can try mag citrate OTC, if that does not work please call in lactulose.

## 2015-08-13 NOTE — Telephone Encounter (Signed)
Asking for something for constipation, it has been 1 week since he had a BM and she has given him Miralax and prunes

## 2015-08-13 NOTE — Telephone Encounter (Signed)
Attempted to return call, but there is no answer

## 2015-08-14 NOTE — Telephone Encounter (Signed)
Order faxed to [facility] for supplies

## 2015-08-15 HISTORY — PX: PICC LINE PLACE PERIPHERAL (ARMC HX): HXRAD1248

## 2015-08-16 ENCOUNTER — Inpatient Hospital Stay: Payer: Medicare HMO

## 2015-08-16 ENCOUNTER — Telehealth: Payer: Self-pay | Admitting: Oncology

## 2015-08-16 ENCOUNTER — Other Ambulatory Visit: Payer: Self-pay | Admitting: Oncology

## 2015-08-16 ENCOUNTER — Inpatient Hospital Stay: Payer: Medicare HMO | Attending: Family Medicine

## 2015-08-16 ENCOUNTER — Inpatient Hospital Stay (HOSPITAL_BASED_OUTPATIENT_CLINIC_OR_DEPARTMENT_OTHER): Payer: Medicare HMO | Admitting: Hematology and Oncology

## 2015-08-16 VITALS — BP 166/75 | HR 56 | Temp 97.5°F | Resp 16 | Wt 188.7 lb

## 2015-08-16 DIAGNOSIS — E785 Hyperlipidemia, unspecified: Secondary | ICD-10-CM | POA: Insufficient documentation

## 2015-08-16 DIAGNOSIS — N289 Disorder of kidney and ureter, unspecified: Secondary | ICD-10-CM

## 2015-08-16 DIAGNOSIS — D649 Anemia, unspecified: Secondary | ICD-10-CM | POA: Diagnosis not present

## 2015-08-16 DIAGNOSIS — Q602 Renal agenesis, unspecified: Secondary | ICD-10-CM | POA: Insufficient documentation

## 2015-08-16 DIAGNOSIS — Z5112 Encounter for antineoplastic immunotherapy: Secondary | ICD-10-CM | POA: Insufficient documentation

## 2015-08-16 DIAGNOSIS — Z79899 Other long term (current) drug therapy: Secondary | ICD-10-CM | POA: Diagnosis not present

## 2015-08-16 DIAGNOSIS — E1165 Type 2 diabetes mellitus with hyperglycemia: Secondary | ICD-10-CM | POA: Insufficient documentation

## 2015-08-16 DIAGNOSIS — Z794 Long term (current) use of insulin: Secondary | ICD-10-CM | POA: Diagnosis not present

## 2015-08-16 DIAGNOSIS — D693 Immune thrombocytopenic purpura: Secondary | ICD-10-CM

## 2015-08-16 DIAGNOSIS — D696 Thrombocytopenia, unspecified: Secondary | ICD-10-CM

## 2015-08-16 DIAGNOSIS — K449 Diaphragmatic hernia without obstruction or gangrene: Secondary | ICD-10-CM

## 2015-08-16 DIAGNOSIS — N135 Crossing vessel and stricture of ureter without hydronephrosis: Secondary | ICD-10-CM

## 2015-08-16 DIAGNOSIS — E119 Type 2 diabetes mellitus without complications: Secondary | ICD-10-CM | POA: Insufficient documentation

## 2015-08-16 DIAGNOSIS — R5383 Other fatigue: Secondary | ICD-10-CM | POA: Diagnosis not present

## 2015-08-16 DIAGNOSIS — I10 Essential (primary) hypertension: Secondary | ICD-10-CM

## 2015-08-16 DIAGNOSIS — B379 Candidiasis, unspecified: Secondary | ICD-10-CM | POA: Insufficient documentation

## 2015-08-16 DIAGNOSIS — E538 Deficiency of other specified B group vitamins: Secondary | ICD-10-CM | POA: Insufficient documentation

## 2015-08-16 DIAGNOSIS — E1122 Type 2 diabetes mellitus with diabetic chronic kidney disease: Secondary | ICD-10-CM | POA: Diagnosis not present

## 2015-08-16 DIAGNOSIS — I129 Hypertensive chronic kidney disease with stage 1 through stage 4 chronic kidney disease, or unspecified chronic kidney disease: Secondary | ICD-10-CM | POA: Diagnosis not present

## 2015-08-16 DIAGNOSIS — E039 Hypothyroidism, unspecified: Secondary | ICD-10-CM

## 2015-08-16 DIAGNOSIS — B370 Candidal stomatitis: Secondary | ICD-10-CM

## 2015-08-16 DIAGNOSIS — M7989 Other specified soft tissue disorders: Secondary | ICD-10-CM | POA: Insufficient documentation

## 2015-08-16 DIAGNOSIS — N189 Chronic kidney disease, unspecified: Secondary | ICD-10-CM

## 2015-08-16 DIAGNOSIS — T380X5A Adverse effect of glucocorticoids and synthetic analogues, initial encounter: Secondary | ICD-10-CM

## 2015-08-16 DIAGNOSIS — Z1159 Encounter for screening for other viral diseases: Secondary | ICD-10-CM

## 2015-08-16 DIAGNOSIS — I2510 Atherosclerotic heart disease of native coronary artery without angina pectoris: Secondary | ICD-10-CM | POA: Diagnosis not present

## 2015-08-16 DIAGNOSIS — R531 Weakness: Secondary | ICD-10-CM | POA: Insufficient documentation

## 2015-08-16 LAB — BASIC METABOLIC PANEL
ANION GAP: 4 — AB (ref 5–15)
BUN: 59 mg/dL — ABNORMAL HIGH (ref 6–20)
CALCIUM: 7.2 mg/dL — AB (ref 8.9–10.3)
CO2: 19 mmol/L — AB (ref 22–32)
Chloride: 105 mmol/L (ref 101–111)
Creatinine, Ser: 2.54 mg/dL — ABNORMAL HIGH (ref 0.61–1.24)
GFR calc Af Amer: 25 mL/min — ABNORMAL LOW (ref >60)
GFR calc non Af Amer: 22 mL/min — ABNORMAL LOW (ref >60)
GLUCOSE: 269 mg/dL — AB (ref 65–99)
POTASSIUM: 6 mmol/L — AB (ref 3.5–5.1)
Sodium: 128 mmol/L — ABNORMAL LOW (ref 135–145)

## 2015-08-16 LAB — CBC WITH DIFFERENTIAL/PLATELET
BASOS ABS: 0 10*3/uL (ref 0–0.1)
Basophils Relative: 0 %
Eosinophils Absolute: 0.1 10*3/uL (ref 0–0.7)
Eosinophils Relative: 1 %
HEMATOCRIT: 28.3 % — AB (ref 40.0–52.0)
HEMOGLOBIN: 9.9 g/dL — AB (ref 13.0–18.0)
LYMPHS ABS: 0.8 10*3/uL — AB (ref 1.0–3.6)
LYMPHS PCT: 7 %
MCH: 33.8 pg (ref 26.0–34.0)
MCHC: 34.9 g/dL (ref 32.0–36.0)
MCV: 96.9 fL (ref 80.0–100.0)
Monocytes Absolute: 0.5 10*3/uL (ref 0.2–1.0)
Monocytes Relative: 5 %
NEUTROS ABS: 9 10*3/uL — AB (ref 1.4–6.5)
Neutrophils Relative %: 87 %
Platelets: 135 10*3/uL — ABNORMAL LOW (ref 150–440)
RBC: 2.92 MIL/uL — AB (ref 4.40–5.90)
RDW: 15.6 % — ABNORMAL HIGH (ref 11.5–14.5)
WBC: 10.3 10*3/uL (ref 3.8–10.6)

## 2015-08-16 MED ORDER — HEPARIN SOD (PORK) LOCK FLUSH 100 UNIT/ML IV SOLN: 500.0000 [IU] | Freq: Once | INTRAVENOUS | Status: AC

## 2015-08-16 MED ORDER — SODIUM CHLORIDE 0.9 % IV SOLN
700.0000 mg | Freq: Once | INTRAVENOUS | Status: AC
  Administered 2015-08-16: 700 mg via INTRAVENOUS
  Filled 2015-08-16: qty 60

## 2015-08-16 MED ORDER — ACETAMINOPHEN 325 MG PO TABS
650.0000 mg | ORAL_TABLET | Freq: Once | ORAL | Status: AC
  Administered 2015-08-16: 650 mg via ORAL
  Filled 2015-08-16: qty 2

## 2015-08-16 MED ORDER — SODIUM CHLORIDE 0.9 % IV SOLN
Freq: Once | INTRAVENOUS | Status: AC
  Administered 2015-08-16: 11:00:00 via INTRAVENOUS
  Filled 2015-08-16: qty 1000

## 2015-08-16 MED ORDER — SODIUM CHLORIDE 0.9% FLUSH
10.0000 mL | INTRAVENOUS | Status: AC | PRN
  Administered 2015-08-16: 10 mL via INTRAVENOUS
  Filled 2015-08-16: qty 10

## 2015-08-16 MED ORDER — NYSTATIN 100000 UNIT/ML MT SUSP: 5.0000 mL | Freq: Four times a day (QID) | OROMUCOSAL | Status: DC

## 2015-08-16 MED ORDER — DIPHENHYDRAMINE HCL 25 MG PO CAPS
25.0000 mg | ORAL_CAPSULE | Freq: Once | ORAL | Status: AC
  Administered 2015-08-16: 25 mg via ORAL
  Filled 2015-08-16: qty 1

## 2015-08-16 MED ORDER — SODIUM CHLORIDE 0.9 % IV SOLN: 375.0000 mg/m2 | Freq: Once | INTRAVENOUS | Status: DC

## 2015-08-16 MED ORDER — SODIUM CHLORIDE 0.9% FLUSH
10.0000 mL | INTRAVENOUS | Status: AC | PRN
  Filled 2015-08-16: qty 10

## 2015-08-16 NOTE — Progress Notes (Unsigned)
Changed PICC line dressing.  Dried bloody drainage present , anti-microbial disc saturated.  Surrounding skin bruised and swollen.  Concerned that line has migrated out and will contact Dr. Grayland Ormond regarding placement verification prior to next infusion.

## 2015-08-16 NOTE — Progress Notes (Signed)
Patient's wife would like to discuss lower Prednisone dose because he is not sleeping and gaining weight.  Having bilateral leg edema and weakness.

## 2015-08-16 NOTE — Progress Notes (Signed)
[facility]  [phone] [phone]  ID: Alejandro Armstrong OB: [birth year]  MR#: [id number]  BWG#:[id number]  Patient Care Team: Casilda Carls, MD as PCP - General (Internal Medicine)  CHIEF COMPLAINT:  Chief Complaint  Patient presents with  . ITP    INTERVAL HISTORY: Patient returns to clinic today for week #3 Rituxan. Patient complains of weakness in his thighs and fatigue. He also has swelling in both of his legs. He has gained 4 pounds since last week.  He states he has had a really good appetite.  He denies any further bleeding, but continues to have easy bruising. He has no neurologic complaints. He denies any fevers. He denies any chest pain or shortness of breath. He denies any nausea, vomiting, constipation, or diarrhea. He has no urinary complaints. Patient otherwise feels well and offers no further specific complaints.   REVIEW OF SYSTEMS:   Review of Systems  Constitutional: Positive for malaise/fatigue. Negative for fever and weight loss.  HENT:       Thrush  Cardiovascular: Negative.   Gastrointestinal: Negative.  Negative for blood in stool and melena.  Genitourinary: Negative.   Musculoskeletal: Negative.   Neurological: Positive for weakness.  Endo/Heme/Allergies: Bruises/bleeds easily.    As per HPI. Otherwise, a complete review of systems is negatve.  PAST MEDICAL HISTORY: Past Medical History  Diagnosis Date  . Hyperlipidemia   . Hypertension   . Hypothyroidism   . Type 2 diabetes mellitus ([place])   . Renal agenesis     discovered at age 74  . Recurrent UTI   . Ureteral stricture     PAST SURGICAL HISTORY: No past surgical history on file.  FAMILY HISTORY: No reported history of malignancy or chronic disease.      ADVANCED DIRECTIVES:    HEALTH MAINTENANCE: Social History  Substance Use Topics  . Smoking status: Never Smoker   . Smokeless tobacco: Never Used  . Alcohol Use: No      Colonoscopy:  PAP:  Bone density:  Lipid panel:  No Known Allergies  Current Outpatient Prescriptions  Medication Sig Dispense Refill  . amLODipine (NORVASC) 5 MG tablet Take 5 mg by mouth.    . calcitRIOL (ROCALTROL) 0.25 MCG capsule Take 0.25 mcg by mouth daily.    . cholecalciferol (VITAMIN D) 1000 units tablet Take 1,000 Units by mouth daily.    . ferrous sulfate 325 (65 FE) MG tablet Take 325 mg by mouth.    . insulin glargine (LANTUS) 100 UNIT/ML injection Inject 0.1 mLs (10 Units total) into the skin at bedtime. 10 mL 11  . levothyroxine (SYNTHROID, LEVOTHROID) 125 MCG tablet     . lisinopril (PRINIVIL,ZESTRIL) 20 MG tablet Take 20 mg by mouth.    . Omega-3 1000 MG CAPS Take by mouth.    . polyethylene glycol (MIRALAX / GLYCOLAX) packet Take 17 g by mouth daily as needed for mild constipation. 14 each 0  . predniSONE (DELTASONE) 20 MG tablet Take 2 tablets (40 mg total) by mouth daily with breakfast. 30 tablet 0  . sodium bicarbonate 650 MG tablet     . nystatin (MYCOSTATIN) 100000 UNIT/ML suspension Take 5 mLs (500,000 Units total) by mouth 4 (four) times daily. 60 mL 1   No current facility-administered medications for this visit.   Facility-Administered Medications Ordered in Other Visits  Medication Dose Route Frequency Provider Last Rate Last Dose  . heparin lock flush 100 unit/mL  500 Units Intravenous Once [name],  MD      . riTUXimab (RITUXAN) 700 mg in sodium chloride 0.9 % 180 mL chemo infusion  700 mg Intravenous Once Evlyn Kanner, NP      . sodium chloride flush (NS) 0.9 % injection 10 mL  10 mL Intravenous PRN [name], MD      . sodium chloride flush (NS) 0.9 % injection 10 mL  10 mL Intravenous PRN [name], MD   10 mL at 08/16/15 0857    OBJECTIVE: Filed Vitals:   08/16/15 0932  BP: 166/75  Pulse: 56  Temp: 97.5 F (36.4 C)  Resp: 16     Body mass index is 29.55 kg/(m^2).    ECOG FS:0 - Asymptomatic  General:  Well-developed, well-nourished, no acute distress. Eyes: Wearing glasses.  Blue.  Pink conjunctiva, anicteric sclera. Mouth:  Left buccal mucosa thrush. Lungs: Clear to auscultation bilaterally. Heart: Regular rate and rhythm. No rubs, murmurs, or gallops. Abdomen: Soft, nontender. No organomegaly noted, normoactive bowel sounds. Ventral hernia noted. Musculoskeletal: Bilateral lower extremity edema 1+ pitting, no cyanosis, or clubbing. Neuro: Alert, answering all questions appropriately. Cranial nerves grossly intact. Skin: Mild bruising noted. Psych: Normal affect. Lymphatics: No cervical, calvicular, axillary or inguinal LAD.   LAB RESULTS:  Lab Results  Component Value Date   NA 139 08/08/2015   K 5.2* 08/08/2015   CL 116* 08/08/2015   CO2 17* 08/08/2015   GLUCOSE 155* 08/08/2015   BUN 51* 08/08/2015   CREATININE 2.29* 08/08/2015   CALCIUM 7.9* 08/08/2015   PROT 6.5 08/05/2015   ALBUMIN 3.5 08/05/2015   AST 27 08/05/2015   ALT 25 08/05/2015   ALKPHOS 121 08/05/2015   BILITOT 0.9 08/05/2015   GFRNONAA 25* 08/08/2015   GFRAA 29* 08/08/2015    Lab Results  Component Value Date   WBC 10.3 08/16/2015   NEUTROABS 9.0* 08/16/2015   HGB 9.9* 08/16/2015   HCT 28.3* 08/16/2015   MCV 96.9 08/16/2015   PLT 135* 08/16/2015     STUDIES: Ct Abdomen Pelvis Wo Contrast  08/06/2015  CLINICAL DATA:  Acute onset of vomiting and tachycardia. Idiopathic thrombocytopenic purpura. Initial encounter. EXAM: CT CHEST, ABDOMEN AND PELVIS WITHOUT CONTRAST TECHNIQUE: Multidetector CT imaging of the chest, abdomen and pelvis was performed following the standard protocol without IV contrast. COMPARISON:  Chest radiograph performed 08/03/2015 FINDINGS: CT CHEST Mild atelectasis or scarring is noted at the left lung base. The lungs are otherwise clear. There is no evidence of focal consolidation, pleural effusion or pneumothorax. No masses are identified. Diffuse coronary artery calcifications are  seen. A small to moderate hiatal hernia is noted. No mediastinal lymphadenopathy is seen. No pericardial effusion is identified. The great vessels are grossly unremarkable. A right-sided PICC is noted ending about the distal SVC. The thyroid gland is diminutive and grossly unremarkable. No axillary lymphadenopathy is seen. No acute osseous abnormalities are identified. CT ABDOMEN AND PELVIS The liver and spleen are unremarkable in appearance. The gallbladder is within normal limits. The pancreas and adrenal glands are unremarkable. Left-sided renal atrophy is noted, with likely left-sided renal cysts and scattered parenchymal calcifications at the lower pole of the left kidney. The right kidney is markedly atrophic. There is no evidence of hydronephrosis. No renal or ureteral stones are identified. No free fluid is identified. The small bowel is unremarkable in appearance. The stomach is otherwise within normal limits. No acute vascular abnormalities are seen. Scattered calcification is noted along the abdominal aorta. The appendix is not  definitely characterized; there is no evidence of appendicitis. The colon is unremarkable in appearance. The bladder is mildly distended and grossly unremarkable. The prostate remains normal in size. No inguinal lymphadenopathy is seen. No acute osseous abnormalities are identified. There is chronic loss of height involving vertebral body L1. IMPRESSION: 1. No acute abnormality seen within the abdomen or pelvis. 2. Mild left basilar atelectasis or scarring. Lungs otherwise clear. 3. Diffuse coronary artery calcifications seen. 4. Small to moderate hiatal hernia noted. 5. Left-sided renal atrophy, with likely left-sided renal cysts and parenchymal calcifications at the lower pole of the left kidney. Marked right renal atrophy. 6. Scattered calcification along the abdominal aorta. 7. Chronic loss of height involving vertebral body L1. Electronically Signed   By: Garald Balding M.D.    On: 08/06/2015 01:09   Ct Chest Wo Contrast  08/06/2015  CLINICAL DATA:  Acute onset of vomiting and tachycardia. Idiopathic thrombocytopenic purpura. Initial encounter. EXAM: CT CHEST, ABDOMEN AND PELVIS WITHOUT CONTRAST TECHNIQUE: Multidetector CT imaging of the chest, abdomen and pelvis was performed following the standard protocol without IV contrast. COMPARISON:  Chest radiograph performed 08/03/2015 FINDINGS: CT CHEST Mild atelectasis or scarring is noted at the left lung base. The lungs are otherwise clear. There is no evidence of focal consolidation, pleural effusion or pneumothorax. No masses are identified. Diffuse coronary artery calcifications are seen. A small to moderate hiatal hernia is noted. No mediastinal lymphadenopathy is seen. No pericardial effusion is identified. The great vessels are grossly unremarkable. A right-sided PICC is noted ending about the distal SVC. The thyroid gland is diminutive and grossly unremarkable. No axillary lymphadenopathy is seen. No acute osseous abnormalities are identified. CT ABDOMEN AND PELVIS The liver and spleen are unremarkable in appearance. The gallbladder is within normal limits. The pancreas and adrenal glands are unremarkable. Left-sided renal atrophy is noted, with likely left-sided renal cysts and scattered parenchymal calcifications at the lower pole of the left kidney. The right kidney is markedly atrophic. There is no evidence of hydronephrosis. No renal or ureteral stones are identified. No free fluid is identified. The small bowel is unremarkable in appearance. The stomach is otherwise within normal limits. No acute vascular abnormalities are seen. Scattered calcification is noted along the abdominal aorta. The appendix is not definitely characterized; there is no evidence of appendicitis. The colon is unremarkable in appearance. The bladder is mildly distended and grossly unremarkable. The prostate remains normal in size. No inguinal  lymphadenopathy is seen. No acute osseous abnormalities are identified. There is chronic loss of height involving vertebral body L1. IMPRESSION: 1. No acute abnormality seen within the abdomen or pelvis. 2. Mild left basilar atelectasis or scarring. Lungs otherwise clear. 3. Diffuse coronary artery calcifications seen. 4. Small to moderate hiatal hernia noted. 5. Left-sided renal atrophy, with likely left-sided renal cysts and parenchymal calcifications at the lower pole of the left kidney. Marked right renal atrophy. 6. Scattered calcification along the abdominal aorta. 7. Chronic loss of height involving vertebral body L1. Electronically Signed   By: Garald Balding M.D.   On: 08/06/2015 01:09   Ct Biopsy  07/25/2015  CLINICAL DATA:  82 year old male with thrombocytopenia EXAM: CT-GUIDED BIOPSY BONE MARROW MEDICATIONS AND MEDICAL HISTORY: Versed 1.0 mg, Fentanyl 0 mcg. Additional Medications: None. ANESTHESIA/SEDATION: None PROCEDURE: The procedure risks, benefits, and alternatives were explained to the patient. Questions regarding the procedure were encouraged and answered. The patient understands and consents to the procedure. Scout CT of the pelvis was performed for surgical planning  purposes. The posterior pelvis was prepped with Betadinein a sterile fashion, and a sterile drape was applied covering the operative field. A sterile gown and sterile gloves were used for the procedure. Local anesthesia was provided with 1% Lidocaine. We targeted the left posterior iliac bone for biopsy. The skin and subcutaneous tissues were infiltrated with 1% lidocaine without epinephrine. A small stab incision was made with an 11 blade scalpel, and an 11 gauge Murphy needle was advanced with CT guidance to the posterior cortex. Manual forced was used to advance the needle through the posterior cortex and the stylet was removed. A bone marrow aspirate was retrieved and passed to a cytotechnologist in the room. The Murphy needle  was then advanced without the stylet for a core biopsy. The core biopsy was retrieved and also passed to a cytotechnologist. Manual pressure was used for hemostasis and a sterile dressing was placed. No complications were encountered no significant blood loss was encountered. Patient tolerated the procedure well and remained hemodynamically stable throughout. FINDINGS: Scout image demonstrates safe approach to posterior iliac bone. Images during the case demonstrate placement of 11 gauge Murphy needle COMPLICATIONS: None IMPRESSION: Status post CT-guided bone marrow biopsy, with tissue specimen sent to pathology for complete histopathologic analysis Signed, Dulcy Fanny. Earleen Newport, DO [facility] Electronically Signed   By: Corrie Mckusick D.O.   On: 07/25/2015 17:08   Dg Chest Port 1 View  08/03/2015  CLINICAL DATA:  Bedside PICC placement. Recent diagnosis of idiopathic thrombocytopenic purpura. EXAM: PORTABLE CHEST 1 VIEW COMPARISON:  12/13/2010. FINDINGS: Right arm PICC tip projects over the mid SVC. Suboptimal inspiration with mild bibasilar atelectasis. Lungs otherwise clear. Cardiac silhouette mildly enlarged, unchanged. Pulmonary vascularity normal. No pleural effusions. IMPRESSION: 1. Right arm PICC tip projects over the mid SVC. 2. Stable mild cardiomegaly. Suboptimal inspiration accounts for mild bibasilar atelectasis. No acute cardiopulmonary disease otherwise. Electronically Signed   By: Evangeline Dakin M.D.   On: 08/03/2015 15:00    ASSESSMENT:  Bone marrow biopsy confirmed ITP.  PLAN:    1. ITP: Bone marrow biopsy revealed normal megakaryocytes as well as normal trilineage hematopoiesis thus suggesting peripheral destruction of his platelets.  Infectious workup did not reveal an underlying viral etiology. Platelets are up today from 60,000 to 135,000. Continue with week #3 of 4 Rituxan 375 mg/m today. Prednisone dose decreased from 40 mg to  20 mg daily.  Plan to continue to taper over the next 1-2 weeks. Return to clinic in one week for week #4 Rituxan, repeat laboratory work, and further evaluation. Hep B surface antigen and total core antibody ordered today.  2. Chronic renal insufficiency: Continue follow-up with nephrology as scheduled.  FAX today's labs to his PCP. 3. Hyperglycemia: Secondary to high dose prednisone. Monitor. Taper prednisone as above. 4. Bilateral leg swelling: U/S to rule out DVT. Given symptoms and physical exam, consider echocardiogram if U/S negative.  5. Thrush: Nystatin 5 cc swish and spit four times daily ordered today. 6. Anemia: B12 deficiency noted on 07/24/2015. Will begin B12 injections weekly for 6 weeks and then monthly.   Patient expressed understanding and was in agreement with this plan. He also understands that he can call clinic at any time with any questions, concerns, or complaints.   Mayra Reel, NP   08/16/2015 11:44 AM   The patient was seen in conjunction with the Mayra Reel, NP.  The patient was examined independently.   The findings are as noted above.  The plan was discussed with the patient and his wife, Izora Gala.  If duplex negative, will likely need cardiac evaluation with echo.  He requires follow-up with his PCP and/or nephrologist for his chronic renal insufficiency.  Numerous questions were asked and answered.  Lequita Asal, MD

## 2015-08-16 NOTE — Telephone Encounter (Signed)
They recently got orders to provide [facility] maintenance supplies to patient but they need to update their [facility] info on him. Please fax over [place] along with recent notes/med list. Thanks! Fax: [phone] Phone: [phone]

## 2015-08-17 ENCOUNTER — Ambulatory Visit
Admission: RE | Admit: 2015-08-17 | Discharge: 2015-08-17 | Disposition: A | Discharge status: Home / Self Care | Payer: Medicare HMO | Source: Ambulatory Visit | Attending: Oncology | Admitting: Oncology

## 2015-08-17 DIAGNOSIS — M7989 Other specified soft tissue disorders: Secondary | ICD-10-CM

## 2015-08-17 LAB — HEPATITIS B CORE ANTIBODY, TOTAL: HEP B C TOTAL AB: NEGATIVE

## 2015-08-17 LAB — HEPATITIS B SURFACE ANTIGEN: Hepatitis B Surface Ag: NEGATIVE

## 2015-08-17 NOTE — Telephone Encounter (Signed)
Requested information sent.

## 2015-08-23 ENCOUNTER — Inpatient Hospital Stay: Payer: Medicare HMO

## 2015-08-23 ENCOUNTER — Inpatient Hospital Stay (HOSPITAL_BASED_OUTPATIENT_CLINIC_OR_DEPARTMENT_OTHER): Payer: Medicare HMO | Admitting: Oncology

## 2015-08-23 VITALS — BP 159/76 | HR 57 | Resp 18

## 2015-08-23 VITALS — BP 157/82 | HR 58 | Temp 96.6°F | Resp 18 | Wt 185.8 lb

## 2015-08-23 DIAGNOSIS — D693 Immune thrombocytopenic purpura: Secondary | ICD-10-CM

## 2015-08-23 DIAGNOSIS — I129 Hypertensive chronic kidney disease with stage 1 through stage 4 chronic kidney disease, or unspecified chronic kidney disease: Secondary | ICD-10-CM

## 2015-08-23 DIAGNOSIS — Z5112 Encounter for antineoplastic immunotherapy: Secondary | ICD-10-CM | POA: Diagnosis not present

## 2015-08-23 DIAGNOSIS — E1122 Type 2 diabetes mellitus with diabetic chronic kidney disease: Secondary | ICD-10-CM | POA: Diagnosis not present

## 2015-08-23 DIAGNOSIS — T380X5A Adverse effect of glucocorticoids and synthetic analogues, initial encounter: Secondary | ICD-10-CM

## 2015-08-23 DIAGNOSIS — D649 Anemia, unspecified: Secondary | ICD-10-CM

## 2015-08-23 DIAGNOSIS — Z79899 Other long term (current) drug therapy: Secondary | ICD-10-CM

## 2015-08-23 DIAGNOSIS — E1165 Type 2 diabetes mellitus with hyperglycemia: Secondary | ICD-10-CM

## 2015-08-23 DIAGNOSIS — D696 Thrombocytopenia, unspecified: Secondary | ICD-10-CM

## 2015-08-23 DIAGNOSIS — E538 Deficiency of other specified B group vitamins: Secondary | ICD-10-CM

## 2015-08-23 LAB — CBC WITH DIFFERENTIAL/PLATELET
BASOS ABS: 0 10*3/uL (ref 0–0.1)
Basophils Relative: 1 %
EOS ABS: 0.1 10*3/uL (ref 0–0.7)
EOS PCT: 1 %
HCT: 29.8 % — ABNORMAL LOW (ref 40.0–52.0)
Hemoglobin: 10.3 g/dL — ABNORMAL LOW (ref 13.0–18.0)
LYMPHS PCT: 11 %
Lymphs Abs: 0.6 10*3/uL — ABNORMAL LOW (ref 1.0–3.6)
MCH: 33.6 pg (ref 26.0–34.0)
MCHC: 34.3 g/dL (ref 32.0–36.0)
MCV: 97.7 fL (ref 80.0–100.0)
MONO ABS: 0.4 10*3/uL (ref 0.2–1.0)
Monocytes Relative: 8 %
Neutro Abs: 4.7 10*3/uL (ref 1.4–6.5)
Neutrophils Relative %: 79 %
PLATELETS: 76 10*3/uL — AB (ref 150–440)
RBC: 3.05 MIL/uL — AB (ref 4.40–5.90)
RDW: 14.9 % — AB (ref 11.5–14.5)
WBC: 5.8 10*3/uL (ref 3.8–10.6)

## 2015-08-23 MED ORDER — HEPARIN SOD (PORK) LOCK FLUSH 100 UNIT/ML IV SOLN: 500.0000 [IU] | Freq: Once | INTRAVENOUS | Status: DC

## 2015-08-23 MED ORDER — SODIUM CHLORIDE 0.9 % IV SOLN: 375.0000 mg/m2 | Freq: Once | INTRAVENOUS | Status: DC

## 2015-08-23 MED ORDER — SODIUM CHLORIDE 0.9 % IV SOLN
700.0000 mg | Freq: Once | INTRAVENOUS | Status: AC
  Administered 2015-08-23: 700 mg via INTRAVENOUS
  Filled 2015-08-23: qty 60

## 2015-08-23 MED ORDER — SODIUM CHLORIDE 0.9 % IV SOLN
Freq: Once | INTRAVENOUS | Status: AC
  Administered 2015-08-23: 11:00:00 via INTRAVENOUS
  Filled 2015-08-23: qty 1000

## 2015-08-23 MED ORDER — ACETAMINOPHEN 325 MG PO TABS
650.0000 mg | ORAL_TABLET | Freq: Once | ORAL | Status: AC
  Administered 2015-08-23: 650 mg via ORAL
  Filled 2015-08-23: qty 2

## 2015-08-23 MED ORDER — SODIUM CHLORIDE 0.9% FLUSH
10.0000 mL | INTRAVENOUS | Status: DC | PRN
  Administered 2015-08-23: 10 mL via INTRAVENOUS
  Filled 2015-08-23: qty 10

## 2015-08-23 MED ORDER — HEPARIN SOD (PORK) LOCK FLUSH 10 UNIT/ML IV SOLN
10.0000 [IU] | Freq: Once | INTRAVENOUS | Status: AC
  Administered 2015-08-23: 10 [IU] via INTRAVENOUS

## 2015-08-23 MED ORDER — DIPHENHYDRAMINE HCL 25 MG PO CAPS
25.0000 mg | ORAL_CAPSULE | Freq: Once | ORAL | Status: AC
Start: 2015-08-23 — End: 2015-08-23
  Administered 2015-08-23: 25 mg via ORAL
  Filled 2015-08-23: qty 1

## 2015-08-23 NOTE — Progress Notes (Signed)
Patient has been started on Lasix by nephrologist for edema.  Patient still has pain in right calf and LE doppler was negative for DVT last week.

## 2015-08-24 ENCOUNTER — Other Ambulatory Visit: Payer: Medicare HMO

## 2015-08-26 ENCOUNTER — Encounter: Payer: Self-pay | Admitting: Hematology and Oncology

## 2015-08-30 ENCOUNTER — Telehealth: Payer: Self-pay | Admitting: *Deleted

## 2015-08-30 ENCOUNTER — Emergency Department
Admission: EM | Admit: 2015-08-30 | Discharge: 2015-08-31 | Disposition: A | Discharge status: Home / Self Care | Payer: Medicare HMO | Source: Home / Self Care | Attending: Emergency Medicine | Admitting: Emergency Medicine

## 2015-08-30 ENCOUNTER — Encounter: Payer: Self-pay | Admitting: *Deleted

## 2015-08-30 DIAGNOSIS — B999 Unspecified infectious disease: Secondary | ICD-10-CM

## 2015-08-30 DIAGNOSIS — Z794 Long term (current) use of insulin: Secondary | ICD-10-CM | POA: Insufficient documentation

## 2015-08-30 DIAGNOSIS — T80211A Bloodstream infection due to central venous catheter, initial encounter: Secondary | ICD-10-CM | POA: Diagnosis not present

## 2015-08-30 DIAGNOSIS — Z7952 Long term (current) use of systemic steroids: Secondary | ICD-10-CM | POA: Insufficient documentation

## 2015-08-30 DIAGNOSIS — E119 Type 2 diabetes mellitus without complications: Secondary | ICD-10-CM | POA: Insufficient documentation

## 2015-08-30 DIAGNOSIS — I10 Essential (primary) hypertension: Secondary | ICD-10-CM

## 2015-08-30 DIAGNOSIS — R7881 Bacteremia: Secondary | ICD-10-CM | POA: Diagnosis not present

## 2015-08-30 DIAGNOSIS — Z79899 Other long term (current) drug therapy: Secondary | ICD-10-CM

## 2015-08-30 DIAGNOSIS — T827XXA Infection and inflammatory reaction due to other cardiac and vascular devices, implants and grafts, initial encounter: Secondary | ICD-10-CM

## 2015-08-30 DIAGNOSIS — Y658 Other specified misadventures during surgical and medical care: Secondary | ICD-10-CM

## 2015-08-30 LAB — GLUCOSE, CAPILLARY: GLUCOSE-CAPILLARY: 146 mg/dL — AB (ref 65–99)

## 2015-08-30 LAB — CBC WITH DIFFERENTIAL/PLATELET
BASOS ABS: 0 10*3/uL (ref 0–0.1)
Basophils Relative: 0 %
EOS ABS: 0 10*3/uL (ref 0–0.7)
EOS PCT: 0 %
HCT: 29.2 % — ABNORMAL LOW (ref 40.0–52.0)
HEMOGLOBIN: 9.9 g/dL — AB (ref 13.0–18.0)
Lymphocytes Relative: 11 %
Lymphs Abs: 1.1 10*3/uL (ref 1.0–3.6)
MCH: 32.2 pg (ref 26.0–34.0)
MCHC: 33.8 g/dL (ref 32.0–36.0)
MCV: 95.5 fL (ref 80.0–100.0)
Monocytes Absolute: 0.8 10*3/uL (ref 0.2–1.0)
Monocytes Relative: 8 %
NEUTROS PCT: 81 %
Neutro Abs: 8.3 10*3/uL — ABNORMAL HIGH (ref 1.4–6.5)
PLATELETS: 120 10*3/uL — AB (ref 150–440)
RBC: 3.06 MIL/uL — AB (ref 4.40–5.90)
RDW: 14.3 % (ref 11.5–14.5)
WBC: 10.3 10*3/uL (ref 3.8–10.6)

## 2015-08-30 LAB — BASIC METABOLIC PANEL
Anion gap: 9 (ref 5–15)
BUN: 54 mg/dL — ABNORMAL HIGH (ref 6–20)
CALCIUM: 8.1 mg/dL — AB (ref 8.9–10.3)
CO2: 20 mmol/L — AB (ref 22–32)
CREATININE: 3.33 mg/dL — AB (ref 0.61–1.24)
Chloride: 107 mmol/L (ref 101–111)
GFR, EST AFRICAN AMERICAN: 18 mL/min — AB (ref >60)
GFR, EST NON AFRICAN AMERICAN: 16 mL/min — AB (ref >60)
Glucose, Bld: 121 mg/dL — ABNORMAL HIGH (ref 65–99)
Potassium: 5.1 mmol/L (ref 3.5–5.1)
SODIUM: 136 mmol/L (ref 135–145)

## 2015-08-30 MED ORDER — VANCOMYCIN HCL IN DEXTROSE 1-5 GM/200ML-% IV SOLN
1000.0000 mg | Freq: Once | INTRAVENOUS | Status: AC
Start: 2015-08-30 — End: 2015-08-31
  Administered 2015-08-30: 1000 mg via INTRAVENOUS
  Filled 2015-08-30: qty 200

## 2015-08-30 MED ORDER — BACITRACIN ZINC 500 UNIT/GM EX OINT
TOPICAL_OINTMENT | Freq: Two times a day (BID) | CUTANEOUS | Status: DC
  Administered 2015-08-30: 1 via TOPICAL
  Filled 2015-08-30: qty 28.35

## 2015-08-30 MED ORDER — BACITRACIN ZINC 500 UNIT/GM EX OINT
TOPICAL_OINTMENT | CUTANEOUS | Status: AC
  Filled 2015-08-30: qty 0.9

## 2015-08-30 MED ORDER — CLINDAMYCIN HCL 300 MG PO CAPS: 300.0000 mg | ORAL_CAPSULE | Freq: Four times a day (QID) | ORAL | Status: DC

## 2015-08-30 MED ORDER — SODIUM CHLORIDE 0.9 % IV BOLUS (SEPSIS)
1000.0000 mL | Freq: Once | INTRAVENOUS | Status: AC
Start: 2015-08-30 — End: 2015-08-31
  Administered 2015-08-30: 1000 mL via INTRAVENOUS

## 2015-08-30 NOTE — ED Notes (Signed)
PICC line removed from RUE by Dr. Archie Balboa. PICC line tip place in sterile cup and sent to lab for culture. Pt tolerated well. Sterile bandage with bacitracin applied to site covered with Tegaderm. Pt tolerated well.

## 2015-08-30 NOTE — ED Provider Notes (Signed)
[facility] Emergency Department Provider Note   ____________________________________________  Time seen: ~2040  I have reviewed the triage vital signs and the nursing notes.   HISTORY  Chief Complaint Vascular Access Problem   History limited by: Not Limited   HPI Alejandro Armstrong is a 82 y.o. male who presents to the emergency department today because of concerns for infected PICC line.The patient states that he feels like the PICC line was pulled out a few centimeters. Has noticed some redness and pus to the area. There is some associated tenderness. He denies any fever, nausea, vomiting, shortness of breath or chest pain. He has the PICC line for Rituxan for ITP.   Past Medical History  Diagnosis Date  . Hyperlipidemia   . Hypertension   . Hypothyroidism   . Type 2 diabetes mellitus ([place])   . Renal agenesis     discovered at age 38  . Recurrent UTI   . Ureteral stricture     Patient Active Problem List   Diagnosis Date Noted  . UTI (lower urinary tract infection) 08/06/2015  . ITP (idiopathic thrombocytopenic purpura) 08/02/2015  . Thrombocytopenia ([place]) 07/24/2015  . B12 deficiency 07/24/2015    History reviewed. No pertinent past surgical history.  Current Outpatient Rx  Name  Route  Sig  Dispense  Refill  . amLODipine (NORVASC) 5 MG tablet   Oral   Take 5 mg by mouth.         . calcitRIOL (ROCALTROL) 0.25 MCG capsule   Oral   Take 0.25 mcg by mouth daily.         . cholecalciferol (VITAMIN D) 1000 units tablet   Oral   Take 1,000 Units by mouth daily.         . ferrous sulfate 325 (65 FE) MG tablet   Oral   Take 325 mg by mouth.         . insulin glargine (LANTUS) 100 UNIT/ML injection   Subcutaneous   Inject 0.1 mLs (10 Units total) into the skin at bedtime.   10 mL   11   . levothyroxine (SYNTHROID, LEVOTHROID) 125 MCG tablet               . lisinopril (PRINIVIL,ZESTRIL) 20 MG tablet   Oral   Take  20 mg by mouth.         . nystatin (MYCOSTATIN) 100000 UNIT/ML suspension   Oral   Take 5 mLs (500,000 Units total) by mouth 4 (four) times daily.   60 mL   1   . Omega-3 1000 MG CAPS   Oral   Take by mouth.         . polyethylene glycol (MIRALAX / GLYCOLAX) packet   Oral   Take 17 g by mouth daily as needed for mild constipation.   14 each   0   . predniSONE (DELTASONE) 20 MG tablet   Oral   Take 2 tablets (40 mg total) by mouth daily with breakfast.   30 tablet   0   . sodium bicarbonate 650 MG tablet                 Allergies Review of patient's allergies indicates no known allergies.  Family History  Problem Relation Age of Onset  . Cervical cancer Sister     Social History Social History  Substance Use Topics  . Smoking status: Never Smoker   . Smokeless tobacco: Never Used  . Alcohol Use: No  Review of Systems  Constitutional: Negative for fever. Cardiovascular: Negative for chest pain. Respiratory: Negative for shortness of breath. Gastrointestinal: Negative for abdominal pain, vomiting and diarrhea. Skin: Positive for redness, pus at site of PICC. Neurological: Negative for headaches, focal weakness or numbness.   10-point ROS otherwise negative.  ____________________________________________   PHYSICAL EXAM:  VITAL SIGNS: ED Triage Vitals  Enc Vitals Group     BP 08/30/15 1613 146/75 mmHg     Pulse Rate 08/30/15 1613 95     Resp 08/30/15 1613 20     Temp 08/30/15 1613 98.2 F (36.8 C)     Temp Source 08/30/15 1613 Oral     SpO2 08/30/15 1613 98 %     Weight 08/30/15 1613 175 lb (79.379 kg)     Height 08/30/15 1613 5\' 7"  (1.702 m)     Head Cir --      Peak Flow --      Pain Score 08/30/15 1613 4   Constitutional: Alert and oriented. Well appearing and in no distress. Eyes: Conjunctivae are normal. PERRL. Normal extraocular movements. ENT   Head: Normocephalic and atraumatic.   Nose: No congestion/rhinnorhea.    Mouth/Throat: Mucous membranes are moist.   Neck: No stridor. Hematological/Lymphatic/Immunilogical: No cervical lymphadenopathy. Cardiovascular: Normal rate, regular rhythm.  No murmurs, rubs, or gallops. Respiratory: Normal respiratory effort without tachypnea nor retractions. Breath sounds are clear and equal bilaterally. No wheezes/rales/rhonchi. Gastrointestinal: Soft and nontender. No distention.  Genitourinary: Deferred Musculoskeletal: Normal range of motion in all extremities. No joint effusions.  No lower extremity tenderness nor edema. Neurologic:  Normal speech and language. No gross focal neurologic deficits are appreciated.  Skin:  PICC line insertion site in the right upper arm. Pus and erythema at the site. Mild tenderness.  Psychiatric: Mood and affect are normal. Speech and behavior are normal. Patient exhibits appropriate insight and judgment.  ____________________________________________    LABS (pertinent positives/negatives)  Labs Reviewed  GLUCOSE, CAPILLARY - Abnormal; Notable for the following:    Glucose-Capillary 146 (*)    All other components within normal limits  CBC WITH DIFFERENTIAL/PLATELET - Abnormal; Notable for the following:    RBC 3.06 (*)    Hemoglobin 9.9 (*)    HCT 29.2 (*)    Platelets 120 (*)    Neutro Abs 8.3 (*)    All other components within normal limits  BASIC METABOLIC PANEL - Abnormal; Notable for the following:    CO2 20 (*)    Glucose, Bld 121 (*)    BUN 54 (*)    Creatinine, Ser 3.33 (*)    Calcium 8.1 (*)    GFR calc non Af Amer 16 (*)    GFR calc Af Amer 18 (*)    All other components within normal limits  WOUND CULTURE  WOUND CULTURE  CULTURE, BLOOD (ROUTINE X 2)  CULTURE, BLOOD (ROUTINE X 2)  CULTURE, BLOOD (SINGLE)  CULTURE, BLOOD (ROUTINE X 2) W REFLEX TO ID PANEL     ____________________________________________   EKG  None  ____________________________________________     RADIOLOGY  None  ____________________________________________   PROCEDURES  Procedure(s) performed: None  Critical Care performed: No  ____________________________________________   INITIAL IMPRESSION / ASSESSMENT AND PLAN / ED COURSE  Pertinent labs & imaging results that were available during my care of the patient were reviewed by me and considered in my medical decision making (see chart for details).  Patient's PICC line was grossly infected at the insertion site. Blood work  and vital signs not consistent with sepsis. Patient does appear slightly dehydrated. We will give fluids and IV antibiotics. The PICC line was pulled. The patient tolerated withdrawal easily. Will give patient additional prescription for oral antibiotics and have patient follow-up with primary care. Feel patient is appropriate for trial of oral antibiotics.   ____________________________________________   FINAL CLINICAL IMPRESSION(S) / ED DIAGNOSES  Final diagnoses:  Infection     Nance Pear, MD 08/31/15 1121

## 2015-08-30 NOTE — ED Notes (Signed)
No change in condition.

## 2015-08-30 NOTE — Discharge Instructions (Signed)
Please seek medical attention for any high fevers, chest pain, shortness of breath, change in behavior, persistent vomiting, bloody stool or any other new or concerning symptoms.  Catheter-Associated Infections FAQs What is a catheter-associated bloodstream infection? A "central line" or "central catheter" is a tube that is placed into a patient's large vein, usually in the neck, chest, arm, or groin. The catheter is often used to draw blood, or give fluids or medications. It may be left in place for several weeks. A bloodstream infection can occur when bacteria or other germs travel down a "central line" and enter the blood. If you develop a catheter-associated bloodstream infection you may become ill with fevers and chills or the skin around the catheter may become sore and red. Can a catheter-related bloodstream infection be treated? A catheter-associated bloodstream infection is serious, but often can be successfully treated with antibiotics. The catheter might need to be removed if you develop an infection. What are some of the things that hospitals are doing to prevent catheter-associated bloodstream infections? To prevent catheter-associated bloodstream infections doctors and nurses will:  Choose a vein where the catheter can be safely inserted and where the risk for infection is small.  Clean their hands with soap and water or an alcohol-based hand rub before putting in the catheter.  Wear a mask, cap, sterile gown, and sterile gloves when putting in the catheter to keep it sterile. The patient will be covered with a sterile sheet.  Clean the patient's skin with an antiseptic cleanser before putting in the catheter.  Clean their hands, wear gloves, and clean the catheter opening with an antiseptic solution before using the catheter to draw blood or give medications. Healthcare providers also clean their hands and wear gloves when changing the bandage that covers the area where the catheter  enters the skin.  Decide every day if the patient still needs to have the catheter. The catheter will be removed as soon as it is no longer needed.  Carefully handle medications and fluids that are given through the catheter. What can I do to help prevent a catheter-associated bloodstream infection?  Ask your doctors and nurses to explain why you need the catheter and how long you will have it.  Ask your doctors and nurses if they will be using all of the prevention methods discussed above.  Make sure that all doctors and nurses caring for you clean their hands with soap and water or an alcohol-based hand rub before and after caring for you.  If you do not see your providers clean their hands, please ask them to do so.  If the bandage comes off or becomes wet or dirty, tell your nurse or doctor immediately.  Inform your nurse or doctor if the area around your catheter is sore or red.  Do not let family and friends who visit touch the catheter or the tubing.  Make sure family and friends clean their hands with soap and water or an alcohol-based hand rub before and after visiting you. What do I need to do when I go home from the hospital? Some patients are sent home from the hospital with a catheter in order to continue their treatment. If you go home with a catheter, your doctors and nurses will explain everything you need to know about taking care of your catheter.  Make sure you understand how to care for the catheter before leaving the hospital. For example, ask for instructions on showering or bathing with the catheter and  how to change the catheter dressing.  Make sure you know who to contact if you have questions or problems after you get home.  Make sure you wash your hands with soap and water or an alcohol-based hand rub before handling your catheter.  Watch for the signs and symptoms of catheter-associated bloodstream infection, such as soreness or redness at the catheter site  or fever, and call your healthcare provider immediately if any occur. If you have additional questions, please ask your doctor or nurse. Developed and co-sponsored by [employer] for [place] [phone]); Infectious Diseases Society of [place] (IDSA); [place]; Association for Professionals in Infection Control and Epidemiology (APIC); Centers for Disease Control and Prevention (CDC); and [employer].   This information is not intended to replace advice given to you by your health care provider. Make sure you discuss any questions you have with your health care provider.   Document Released: 09/27/2010 Document Revised: 10/17/2014 Document Reviewed: 08/16/2014 Elsevier Interactive Patient Education [employer].

## 2015-08-30 NOTE — ED Notes (Signed)
Pt sent by MD for infected PICC ine to right upper arm; pt states PICC line needs to be changed

## 2015-08-30 NOTE — Telephone Encounter (Addendum)
Called to report that his PICC line is out 3 inches and he has odor and drainage from the area. She had advised for him to go to ER, but they wanted to check with [place] fortst. I advised that indeed he should go to ER

## 2015-08-31 LAB — BLOOD CULTURE ID PANEL (REFLEXED)
Acinetobacter baumannii: NOT DETECTED
CANDIDA KRUSEI: NOT DETECTED
CANDIDA PARAPSILOSIS: NOT DETECTED
CARBAPENEM RESISTANCE: NOT DETECTED
Candida albicans: NOT DETECTED
Candida glabrata: NOT DETECTED
Candida tropicalis: NOT DETECTED
Enterobacter cloacae complex: NOT DETECTED
Enterobacteriaceae species: NOT DETECTED
Enterococcus species: NOT DETECTED
Escherichia coli: NOT DETECTED
Haemophilus influenzae: NOT DETECTED
KLEBSIELLA OXYTOCA: NOT DETECTED
KLEBSIELLA PNEUMONIAE: NOT DETECTED
LISTERIA MONOCYTOGENES: NOT DETECTED
Methicillin resistance: DETECTED — AB
Neisseria meningitidis: NOT DETECTED
PSEUDOMONAS AERUGINOSA: NOT DETECTED
Proteus species: NOT DETECTED
SERRATIA MARCESCENS: NOT DETECTED
STAPHYLOCOCCUS SPECIES: DETECTED — AB
STREPTOCOCCUS AGALACTIAE: NOT DETECTED
Staphylococcus aureus (BCID): DETECTED — AB
Streptococcus pneumoniae: NOT DETECTED
Streptococcus pyogenes: NOT DETECTED
Streptococcus species: NOT DETECTED
Vancomycin resistance: NOT DETECTED

## 2015-08-31 MED ORDER — SULFAMETHOXAZOLE-TRIMETHOPRIM 800-160 MG PO TABS: 1.0000 | ORAL_TABLET | Freq: Two times a day (BID) | ORAL | Status: DC

## 2015-08-31 NOTE — ED Provider Notes (Signed)
-----------------------------------------   4:02 PM on 08/31/2015 -----------------------------------------  Patient's culture growing MRSA. Discussed with pharmacist who recommended switching patient to bactrim. I discussed this with patient on the phone. He states that he is feeling better today. Denies any fevers. I have spoken to patient's preferred [facility] and have ordered bactrim DS BID x 10 days.  Nance Pear, MD 08/31/15 [phone]

## 2015-08-31 NOTE — Telephone Encounter (Signed)
Agreed.  Unless [name] is will to evaluate.

## 2015-08-31 NOTE — Telephone Encounter (Signed)
He went ot [facility] they pulled it and put him on oral abx

## 2015-09-01 ENCOUNTER — Telehealth: Payer: Self-pay | Admitting: Internal Medicine

## 2015-09-01 ENCOUNTER — Encounter: Payer: Self-pay | Admitting: Emergency Medicine

## 2015-09-01 ENCOUNTER — Emergency Department: Payer: Medicare HMO

## 2015-09-01 ENCOUNTER — Inpatient Hospital Stay
Admission: EM | Admit: 2015-09-01 | Discharge: 2015-09-06 | DRG: 314 | Disposition: A | Discharge status: Home Health | Payer: Medicare HMO | Attending: Internal Medicine | Admitting: Internal Medicine

## 2015-09-01 DIAGNOSIS — Y848 Other medical procedures as the cause of abnormal reaction of the patient, or of later complication, without mention of misadventure at the time of the procedure: Secondary | ICD-10-CM | POA: Diagnosis present

## 2015-09-01 DIAGNOSIS — Z87440 Personal history of urinary (tract) infections: Secondary | ICD-10-CM | POA: Diagnosis not present

## 2015-09-01 DIAGNOSIS — N400 Enlarged prostate without lower urinary tract symptoms: Secondary | ICD-10-CM | POA: Diagnosis present

## 2015-09-01 DIAGNOSIS — T80211A Bloodstream infection due to central venous catheter, initial encounter: Principal | ICD-10-CM | POA: Diagnosis present

## 2015-09-01 DIAGNOSIS — Q600 Renal agenesis, unilateral: Secondary | ICD-10-CM

## 2015-09-01 DIAGNOSIS — I82621 Acute embolism and thrombosis of deep veins of right upper extremity: Secondary | ICD-10-CM | POA: Diagnosis present

## 2015-09-01 DIAGNOSIS — D638 Anemia in other chronic diseases classified elsewhere: Secondary | ICD-10-CM | POA: Diagnosis present

## 2015-09-01 DIAGNOSIS — E039 Hypothyroidism, unspecified: Secondary | ICD-10-CM | POA: Diagnosis present

## 2015-09-01 DIAGNOSIS — Z79899 Other long term (current) drug therapy: Secondary | ICD-10-CM | POA: Diagnosis not present

## 2015-09-01 DIAGNOSIS — E785 Hyperlipidemia, unspecified: Secondary | ICD-10-CM | POA: Diagnosis present

## 2015-09-01 DIAGNOSIS — N289 Disorder of kidney and ureter, unspecified: Secondary | ICD-10-CM

## 2015-09-01 DIAGNOSIS — N19 Unspecified kidney failure: Secondary | ICD-10-CM

## 2015-09-01 DIAGNOSIS — Z8049 Family history of malignant neoplasm of other genital organs: Secondary | ICD-10-CM

## 2015-09-01 DIAGNOSIS — B9562 Methicillin resistant Staphylococcus aureus infection as the cause of diseases classified elsewhere: Secondary | ICD-10-CM | POA: Diagnosis present

## 2015-09-01 DIAGNOSIS — E1122 Type 2 diabetes mellitus with diabetic chronic kidney disease: Secondary | ICD-10-CM | POA: Diagnosis present

## 2015-09-01 DIAGNOSIS — E875 Hyperkalemia: Secondary | ICD-10-CM

## 2015-09-01 DIAGNOSIS — N189 Chronic kidney disease, unspecified: Secondary | ICD-10-CM

## 2015-09-01 DIAGNOSIS — A4102 Sepsis due to Methicillin resistant Staphylococcus aureus: Secondary | ICD-10-CM | POA: Diagnosis present

## 2015-09-01 DIAGNOSIS — R7881 Bacteremia: Secondary | ICD-10-CM

## 2015-09-01 DIAGNOSIS — N170 Acute kidney failure with tubular necrosis: Secondary | ICD-10-CM | POA: Diagnosis present

## 2015-09-01 DIAGNOSIS — Z794 Long term (current) use of insulin: Secondary | ICD-10-CM

## 2015-09-01 DIAGNOSIS — E876 Hypokalemia: Secondary | ICD-10-CM | POA: Diagnosis present

## 2015-09-01 DIAGNOSIS — E119 Type 2 diabetes mellitus without complications: Secondary | ICD-10-CM

## 2015-09-01 DIAGNOSIS — I82611 Acute embolism and thrombosis of superficial veins of right upper extremity: Secondary | ICD-10-CM | POA: Diagnosis present

## 2015-09-01 DIAGNOSIS — Z87442 Personal history of urinary calculi: Secondary | ICD-10-CM

## 2015-09-01 DIAGNOSIS — N2581 Secondary hyperparathyroidism of renal origin: Secondary | ICD-10-CM | POA: Diagnosis present

## 2015-09-01 DIAGNOSIS — R609 Edema, unspecified: Secondary | ICD-10-CM

## 2015-09-01 DIAGNOSIS — N184 Chronic kidney disease, stage 4 (severe): Secondary | ICD-10-CM | POA: Diagnosis present

## 2015-09-01 DIAGNOSIS — E872 Acidosis: Secondary | ICD-10-CM | POA: Diagnosis present

## 2015-09-01 DIAGNOSIS — D693 Immune thrombocytopenic purpura: Secondary | ICD-10-CM | POA: Diagnosis present

## 2015-09-01 DIAGNOSIS — I129 Hypertensive chronic kidney disease with stage 1 through stage 4 chronic kidney disease, or unspecified chronic kidney disease: Secondary | ICD-10-CM | POA: Diagnosis present

## 2015-09-01 DIAGNOSIS — R6520 Severe sepsis without septic shock: Secondary | ICD-10-CM | POA: Diagnosis present

## 2015-09-01 DIAGNOSIS — Z452 Encounter for adjustment and management of vascular access device: Secondary | ICD-10-CM

## 2015-09-01 DIAGNOSIS — E1129 Type 2 diabetes mellitus with other diabetic kidney complication: Secondary | ICD-10-CM

## 2015-09-01 DIAGNOSIS — A4101 Sepsis due to Methicillin susceptible Staphylococcus aureus: Secondary | ICD-10-CM | POA: Diagnosis not present

## 2015-09-01 HISTORY — DX: Calculus of kidney: N20.0

## 2015-09-01 LAB — CBC WITH DIFFERENTIAL/PLATELET
BASOS PCT: 0 %
Basophils Absolute: 0 10*3/uL (ref 0–0.1)
EOS ABS: 0 10*3/uL (ref 0–0.7)
EOS PCT: 0 %
HCT: 27.7 % — ABNORMAL LOW (ref 40.0–52.0)
HEMOGLOBIN: 9.6 g/dL — AB (ref 13.0–18.0)
LYMPHS ABS: 0.7 10*3/uL — AB (ref 1.0–3.6)
Lymphocytes Relative: 8 %
MCH: 33 pg (ref 26.0–34.0)
MCHC: 34.6 g/dL (ref 32.0–36.0)
MCV: 95.3 fL (ref 80.0–100.0)
MONO ABS: 0.5 10*3/uL (ref 0.2–1.0)
MONOS PCT: 5 %
NEUTROS PCT: 87 %
Neutro Abs: 7.7 10*3/uL — ABNORMAL HIGH (ref 1.4–6.5)
Platelets: 165 10*3/uL (ref 150–440)
RBC: 2.9 MIL/uL — ABNORMAL LOW (ref 4.40–5.90)
RDW: 14.3 % (ref 11.5–14.5)
WBC: 8.9 10*3/uL (ref 3.8–10.6)

## 2015-09-01 LAB — BASIC METABOLIC PANEL
Anion gap: 11 (ref 5–15)
BUN: 57 mg/dL — AB (ref 6–20)
CALCIUM: 7.5 mg/dL — AB (ref 8.9–10.3)
CHLORIDE: 105 mmol/L (ref 101–111)
CO2: 18 mmol/L — ABNORMAL LOW (ref 22–32)
CREATININE: 4.02 mg/dL — AB (ref 0.61–1.24)
GFR calc non Af Amer: 13 mL/min — ABNORMAL LOW (ref >60)
GFR, EST AFRICAN AMERICAN: 15 mL/min — AB (ref >60)
Glucose, Bld: 214 mg/dL — ABNORMAL HIGH (ref 65–99)
Potassium: 5.3 mmol/L — ABNORMAL HIGH (ref 3.5–5.1)
SODIUM: 134 mmol/L — AB (ref 135–145)

## 2015-09-01 MED ORDER — POLYETHYLENE GLYCOL 3350 17 G PO PACK: 17.0000 g | PACK | Freq: Every day | ORAL | Status: DC | PRN

## 2015-09-01 MED ORDER — SODIUM CHLORIDE 0.9 % IV BOLUS (SEPSIS)
1000.0000 mL | Freq: Once | INTRAVENOUS | Status: AC
  Administered 2015-09-01: 1000 mL via INTRAVENOUS

## 2015-09-01 MED ORDER — SODIUM POLYSTYRENE SULFONATE 15 GM/60ML PO SUSP
15.0000 g | Freq: Once | ORAL | Status: AC
  Administered 2015-09-02: 15 g via ORAL
  Filled 2015-09-01: qty 60

## 2015-09-01 MED ORDER — VANCOMYCIN HCL IN DEXTROSE 1-5 GM/200ML-% IV SOLN
1000.0000 mg | Freq: Once | INTRAVENOUS | Status: AC
  Administered 2015-09-01: 1000 mg via INTRAVENOUS
  Filled 2015-09-01: qty 200

## 2015-09-01 MED ORDER — FERROUS SULFATE 325 (65 FE) MG PO TABS
325.0000 mg | ORAL_TABLET | Freq: Two times a day (BID) | ORAL | Status: DC
  Administered 2015-09-02 – 2015-09-06 (×9): 325 mg via ORAL
  Filled 2015-09-01 (×9): qty 1

## 2015-09-01 MED ORDER — INSULIN ASPART 100 UNIT/ML ~~LOC~~ SOLN
0.0000 [IU] | Freq: Three times a day (TID) | SUBCUTANEOUS | Status: DC
  Administered 2015-09-02 (×2): 2 [IU] via SUBCUTANEOUS
  Filled 2015-09-01 (×2): qty 2

## 2015-09-01 MED ORDER — CALCITRIOL 0.25 MCG PO CAPS
0.2500 ug | ORAL_CAPSULE | Freq: Every day | ORAL | Status: DC
  Administered 2015-09-02 – 2015-09-06 (×5): 0.25 ug via ORAL
  Filled 2015-09-01 (×5): qty 1

## 2015-09-01 MED ORDER — HEPARIN SODIUM (PORCINE) 5000 UNIT/ML IJ SOLN
5000.0000 [IU] | Freq: Three times a day (TID) | INTRAMUSCULAR | Status: DC
  Administered 2015-09-02 – 2015-09-06 (×15): 5000 [IU] via SUBCUTANEOUS
  Filled 2015-09-01 (×14): qty 1

## 2015-09-01 MED ORDER — SODIUM BICARBONATE 650 MG PO TABS
650.0000 mg | ORAL_TABLET | Freq: Every day | ORAL | Status: DC
  Administered 2015-09-02 – 2015-09-06 (×5): 650 mg via ORAL
  Filled 2015-09-01 (×5): qty 1

## 2015-09-01 MED ORDER — SODIUM CHLORIDE 0.9 % IV SOLN
1.0000 g | Freq: Once | INTRAVENOUS | Status: AC
  Administered 2015-09-01: 1 g via INTRAVENOUS
  Filled 2015-09-01: qty 10

## 2015-09-01 MED ORDER — AMLODIPINE BESYLATE 5 MG PO TABS
5.0000 mg | ORAL_TABLET | Freq: Every day | ORAL | Status: DC
  Administered 2015-09-02 – 2015-09-06 (×5): 5 mg via ORAL
  Filled 2015-09-01 (×5): qty 1

## 2015-09-01 MED ORDER — VITAMIN D 1000 UNITS PO TABS
1000.0000 [IU] | ORAL_TABLET | Freq: Every day | ORAL | Status: DC
  Administered 2015-09-02 – 2015-09-06 (×5): 1000 [IU] via ORAL
  Filled 2015-09-01 (×5): qty 1

## 2015-09-01 MED ORDER — LEVOTHYROXINE SODIUM 125 MCG PO TABS
125.0000 ug | ORAL_TABLET | Freq: Every day | ORAL | Status: DC
  Administered 2015-09-02 – 2015-09-06 (×5): 125 ug via ORAL
  Filled 2015-09-01 (×5): qty 1

## 2015-09-01 MED ORDER — INSULIN GLARGINE 100 UNIT/ML ~~LOC~~ SOLN
10.0000 [IU] | Freq: Every day | SUBCUTANEOUS | Status: DC
  Administered 2015-09-02: 10 [IU] via SUBCUTANEOUS
  Filled 2015-09-01 (×2): qty 0.1

## 2015-09-01 NOTE — Progress Notes (Signed)
[place]  Telephone:([phone]) [phone] Fax:(336) [phone]  ID: [name] OB: 09-Mar-1930  MR#: [id number]  EKC#:[id number]  Patient Care Team: Casilda Carls, MD as PCP - General (Internal Medicine)  CHIEF COMPLAINT:  Chief Complaint  Patient presents with  . ITP    INTERVAL HISTORY: Patient returns to clinic today for Further evaluation and consideration of cycle 4 of 4 of weekly Rituxan. He continues to have weakness and fatigue. He has mild swelling in bilateral lower extremities. He denies any further bleeding, but continues to have easy bruising. He has no neurologic complaints. He denies any fevers. He denies any chest pain or shortness of breath. He denies any nausea, vomiting, constipation, or diarrhea. He has no urinary complaints. Patient otherwise feels well and offers no further specific complaints.   REVIEW OF SYSTEMS:   Review of Systems  Constitutional: Positive for malaise/fatigue. Negative for fever and weight loss.  HENT:       Thrush  Cardiovascular: Negative.   Gastrointestinal: Negative.  Negative for blood in stool and melena.  Genitourinary: Negative.   Musculoskeletal: Negative.   Neurological: Positive for weakness.  Endo/Heme/Allergies: Bruises/bleeds easily.    As per HPI. Otherwise, a complete review of systems is negatve.  PAST MEDICAL HISTORY: Past Medical History  Diagnosis Date  . Hyperlipidemia   . Hypertension   . Hypothyroidism   . Type 2 diabetes mellitus ([place])   . Renal agenesis     discovered at age 82  . Recurrent UTI   . Ureteral stricture     PAST SURGICAL HISTORY: No past surgical history on file.  FAMILY HISTORY: No reported history of malignancy or chronic disease.      ADVANCED DIRECTIVES:    HEALTH MAINTENANCE: Social History  Substance Use Topics  . Smoking status: Never Smoker   . Smokeless tobacco: Never Used  . Alcohol Use: No     Colonoscopy:  PAP:  Bone density:  Lipid  panel:  No Known Allergies  Current Outpatient Prescriptions  Medication Sig Dispense Refill  . amLODipine (NORVASC) 5 MG tablet Take 5 mg by mouth.    . calcitRIOL (ROCALTROL) 0.25 MCG capsule Take 0.25 mcg by mouth daily.    . cholecalciferol (VITAMIN D) 1000 units tablet Take 1,000 Units by mouth daily.    . clindamycin (CLEOCIN) 300 MG capsule Take 1 capsule (300 mg total) by mouth 4 (four) times daily. 30 capsule 0  . ferrous sulfate 325 (65 FE) MG tablet Take 325 mg by mouth.    . insulin glargine (LANTUS) 100 UNIT/ML injection Inject 0.1 mLs (10 Units total) into the skin at bedtime. 10 mL 11  . levothyroxine (SYNTHROID, LEVOTHROID) 125 MCG tablet     . lisinopril (PRINIVIL,ZESTRIL) 20 MG tablet Take 20 mg by mouth.    . nystatin (MYCOSTATIN) 100000 UNIT/ML suspension Take 5 mLs (500,000 Units total) by mouth 4 (four) times daily. 60 mL 1  . Omega-3 1000 MG CAPS Take by mouth.    . polyethylene glycol (MIRALAX / GLYCOLAX) packet Take 17 g by mouth daily as needed for mild constipation. 14 each 0  . predniSONE (DELTASONE) 20 MG tablet Take 2 tablets (40 mg total) by mouth daily with breakfast. 30 tablet 0  . sodium bicarbonate 650 MG tablet     . sulfamethoxazole-trimethoprim (BACTRIM DS,SEPTRA DS) 800-160 MG tablet Take 1 tablet by mouth 2 (two) times daily. 20 tablet 0   No current facility-administered medications for this visit.   Facility-Administered  Medications Ordered in Other Visits  Medication Dose Route Frequency Provider Last Rate Last Dose  . heparin lock flush 100 unit/mL  500 Units Intravenous Once [name], MD      . sodium chloride flush (NS) 0.9 % injection 10 mL  10 mL Intravenous PRN [name], MD      . sodium chloride flush (NS) 0.9 % injection 10 mL  10 mL Intravenous PRN [name], MD   10 mL at 08/16/15 0857    OBJECTIVE: Filed Vitals:   08/23/15 1037  BP: 157/82  Pulse: 58  Temp: 96.6 F (35.9 C)  Resp: 18     Body mass  index is 29.1 kg/(m^2).    ECOG FS:0 - Asymptomatic  General: Well-developed, well-nourished, no acute distress. Eyes: Pink conjunctiva, anicteric sclera. Lungs: Clear to auscultation bilaterally. Heart: Regular rate and rhythm. No rubs, murmurs, or gallops. Abdomen: Soft, nontender. No organomegaly noted, normoactive bowel sounds. Ventral hernia noted. Musculoskeletal: Bilateral lower extremity edema 1+ pitting, no cyanosis, or clubbing. Neuro: Alert, answering all questions appropriately. Cranial nerves grossly intact. Skin: Mild bruising noted. Psych: Normal affect.   LAB RESULTS:  Lab Results  Component Value Date   NA 136 08/30/2015   K 5.1 08/30/2015   CL 107 08/30/2015   CO2 20* 08/30/2015   GLUCOSE 121* 08/30/2015   BUN 54* 08/30/2015   CREATININE 3.33* 08/30/2015   CALCIUM 8.1* 08/30/2015   PROT 6.5 08/05/2015   ALBUMIN 3.5 08/05/2015   AST 27 08/05/2015   ALT 25 08/05/2015   ALKPHOS 121 08/05/2015   BILITOT 0.9 08/05/2015   GFRNONAA 16* 08/30/2015   GFRAA 18* 08/30/2015    Lab Results  Component Value Date   WBC 10.3 08/30/2015   NEUTROABS 8.3* 08/30/2015   HGB 9.9* 08/30/2015   HCT 29.2* 08/30/2015   MCV 95.5 08/30/2015   PLT 120* 08/30/2015     STUDIES: Ct Abdomen Pelvis Wo Contrast  08/06/2015  CLINICAL DATA:  Acute onset of vomiting and tachycardia. Idiopathic thrombocytopenic purpura. Initial encounter. EXAM: CT CHEST, ABDOMEN AND PELVIS WITHOUT CONTRAST TECHNIQUE: Multidetector CT imaging of the chest, abdomen and pelvis was performed following the standard protocol without IV contrast. COMPARISON:  Chest radiograph performed 08/03/2015 FINDINGS: CT CHEST Mild atelectasis or scarring is noted at the left lung base. The lungs are otherwise clear. There is no evidence of focal consolidation, pleural effusion or pneumothorax. No masses are identified. Diffuse coronary artery calcifications are seen. A small to moderate hiatal hernia is noted. No  mediastinal lymphadenopathy is seen. No pericardial effusion is identified. The great vessels are grossly unremarkable. A right-sided PICC is noted ending about the distal SVC. The thyroid gland is diminutive and grossly unremarkable. No axillary lymphadenopathy is seen. No acute osseous abnormalities are identified. CT ABDOMEN AND PELVIS The liver and spleen are unremarkable in appearance. The gallbladder is within normal limits. The pancreas and adrenal glands are unremarkable. Left-sided renal atrophy is noted, with likely left-sided renal cysts and scattered parenchymal calcifications at the lower pole of the left kidney. The right kidney is markedly atrophic. There is no evidence of hydronephrosis. No renal or ureteral stones are identified. No free fluid is identified. The small bowel is unremarkable in appearance. The stomach is otherwise within normal limits. No acute vascular abnormalities are seen. Scattered calcification is noted along the abdominal aorta. The appendix is not definitely characterized; there is no evidence of appendicitis. The colon is unremarkable in appearance. The bladder is  mildly distended and grossly unremarkable. The prostate remains normal in size. No inguinal lymphadenopathy is seen. No acute osseous abnormalities are identified. There is chronic loss of height involving vertebral body L1. IMPRESSION: 1. No acute abnormality seen within the abdomen or pelvis. 2. Mild left basilar atelectasis or scarring. Lungs otherwise clear. 3. Diffuse coronary artery calcifications seen. 4. Small to moderate hiatal hernia noted. 5. Left-sided renal atrophy, with likely left-sided renal cysts and parenchymal calcifications at the lower pole of the left kidney. Marked right renal atrophy. 6. Scattered calcification along the abdominal aorta. 7. Chronic loss of height involving vertebral body L1. Electronically Signed   By: Garald Balding M.D.   On: 08/06/2015 01:09   Ct Chest Wo  Contrast  08/06/2015  CLINICAL DATA:  Acute onset of vomiting and tachycardia. Idiopathic thrombocytopenic purpura. Initial encounter. EXAM: CT CHEST, ABDOMEN AND PELVIS WITHOUT CONTRAST TECHNIQUE: Multidetector CT imaging of the chest, abdomen and pelvis was performed following the standard protocol without IV contrast. COMPARISON:  Chest radiograph performed 08/03/2015 FINDINGS: CT CHEST Mild atelectasis or scarring is noted at the left lung base. The lungs are otherwise clear. There is no evidence of focal consolidation, pleural effusion or pneumothorax. No masses are identified. Diffuse coronary artery calcifications are seen. A small to moderate hiatal hernia is noted. No mediastinal lymphadenopathy is seen. No pericardial effusion is identified. The great vessels are grossly unremarkable. A right-sided PICC is noted ending about the distal SVC. The thyroid gland is diminutive and grossly unremarkable. No axillary lymphadenopathy is seen. No acute osseous abnormalities are identified. CT ABDOMEN AND PELVIS The liver and spleen are unremarkable in appearance. The gallbladder is within normal limits. The pancreas and adrenal glands are unremarkable. Left-sided renal atrophy is noted, with likely left-sided renal cysts and scattered parenchymal calcifications at the lower pole of the left kidney. The right kidney is markedly atrophic. There is no evidence of hydronephrosis. No renal or ureteral stones are identified. No free fluid is identified. The small bowel is unremarkable in appearance. The stomach is otherwise within normal limits. No acute vascular abnormalities are seen. Scattered calcification is noted along the abdominal aorta. The appendix is not definitely characterized; there is no evidence of appendicitis. The colon is unremarkable in appearance. The bladder is mildly distended and grossly unremarkable. The prostate remains normal in size. No inguinal lymphadenopathy is seen. No acute osseous  abnormalities are identified. There is chronic loss of height involving vertebral body L1. IMPRESSION: 1. No acute abnormality seen within the abdomen or pelvis. 2. Mild left basilar atelectasis or scarring. Lungs otherwise clear. 3. Diffuse coronary artery calcifications seen. 4. Small to moderate hiatal hernia noted. 5. Left-sided renal atrophy, with likely left-sided renal cysts and parenchymal calcifications at the lower pole of the left kidney. Marked right renal atrophy. 6. Scattered calcification along the abdominal aorta. 7. Chronic loss of height involving vertebral body L1. Electronically Signed   By: Garald Balding M.D.   On: 08/06/2015 01:09   US Venous Img Lower Bilateral  08/17/2015  CLINICAL DATA:  82 year old male with a history of swelling bilateral lower extremities EXAM: BILATERAL LOWER EXTREMITY VENOUS DOPPLER ULTRASOUND TECHNIQUE: Gray-scale sonography with graded compression, as well as color Doppler and duplex ultrasound were performed to evaluate the lower extremity deep venous systems from the level of the common femoral vein and including the common femoral, femoral, profunda femoral, popliteal and calf veins including the posterior tibial, peroneal and gastrocnemius veins when visible. The superficial great saphenous vein  was also interrogated. Spectral Doppler was utilized to evaluate flow at rest and with distal augmentation maneuvers in the common femoral, femoral and popliteal veins. COMPARISON:  None. FINDINGS: RIGHT LOWER EXTREMITY Common Femoral Vein: No evidence of thrombus. Normal compressibility, respiratory phasicity and response to augmentation. Saphenofemoral Junction: No evidence of thrombus. Normal compressibility and flow on color Doppler imaging. Profunda Femoral Vein: No evidence of thrombus. Normal compressibility and flow on color Doppler imaging. Femoral Vein: No evidence of thrombus. Normal compressibility, respiratory phasicity and response to augmentation.  Popliteal Vein: No evidence of thrombus. Normal compressibility, respiratory phasicity and response to augmentation. Calf Veins: No evidence of thrombus. Normal compressibility and flow on color Doppler imaging. Superficial Great Saphenous Vein: No evidence of thrombus. Normal compressibility and flow on color Doppler imaging. Other Findings: Anechoic lentiform structure in the right popliteal fossa measuring 4.1 cm x 1.5 cm x 2.9 cm. No internal flow. LEFT LOWER EXTREMITY Common Femoral Vein: No evidence of thrombus. Normal compressibility, respiratory phasicity and response to augmentation. Saphenofemoral Junction: No evidence of thrombus. Normal compressibility and flow on color Doppler imaging. Profunda Femoral Vein: No evidence of thrombus. Normal compressibility and flow on color Doppler imaging. Femoral Vein: No evidence of thrombus. Normal compressibility, respiratory phasicity and response to augmentation. Popliteal Vein: No evidence of thrombus. Normal compressibility, respiratory phasicity and response to augmentation. Calf Veins: No evidence of thrombus. Normal compressibility and flow on color Doppler imaging. Superficial Great Saphenous Vein: No evidence of thrombus. Normal compressibility and flow on color Doppler imaging. Other Findings:  None. IMPRESSION: Sonographic survey of the bilateral lower extremities negative for DVT. Likely Baker cyst in the right popliteal fossa. Signed, Dulcy Fanny. Earleen Newport, DO [facility] Electronically Signed   By: Corrie Mckusick D.O.   On: 08/17/2015 15:45   Dg Chest Port 1 View  08/03/2015  CLINICAL DATA:  Bedside PICC placement. Recent diagnosis of idiopathic thrombocytopenic purpura. EXAM: PORTABLE CHEST 1 VIEW COMPARISON:  12/13/2010. FINDINGS: Right arm PICC tip projects over the mid SVC. Suboptimal inspiration with mild bibasilar atelectasis. Lungs otherwise clear. Cardiac silhouette mildly enlarged,  unchanged. Pulmonary vascularity normal. No pleural effusions. IMPRESSION: 1. Right arm PICC tip projects over the mid SVC. 2. Stable mild cardiomegaly. Suboptimal inspiration accounts for mild bibasilar atelectasis. No acute cardiopulmonary disease otherwise. Electronically Signed   By: Evangeline Dakin M.D.   On: 08/03/2015 15:00    ASSESSMENT:  Bone marrow biopsy confirmed ITP.  PLAN:    1. ITP: Bone marrow biopsy revealed normal megakaryocytes as well as normal trilineage hematopoiesis thus suggesting peripheral destruction of his platelets.  Infectious workup did not reveal an underlying viral etiology. Platelets have slightly decreased to 76, but will continue with cycle 4 of 4 of Rituxan 375 mg/m today. She was given instructions for prednisone taper over the next 1-2 weeks. Return to clinic in 2 weeks with repeat laboratory work and further evaluation. 2. Chronic renal insufficiency: Continue follow-up with nephrology as scheduled. 3. Hyperglycemia: Secondary to high dose prednisone. Monitor. Taper prednisone as above. 4. Bilateral leg swelling: No obvious DVT on ultrasound. U/S to rule out DVT. Given symptoms and physical exam, consider echocardiogram and cardiology referral 5. Thrush: Resolved. Continue Nystatin 5 cc swish and spit four times daily as needed. 6. Anemia: B12 deficiency noted on 07/24/2015. Will begin B12 injections weekly for 6 weeks and then monthly.   Patient expressed understanding and was in agreement with this plan. He also understands that he can  call clinic at any time with any questions, concerns, or complaints.   [name], MD   09/01/2015 10:43 AM

## 2015-09-01 NOTE — H&P (Signed)
[facility] NAME: Alejandro Armstrong    MR#:  [id number]  DATE OF BIRTH:  1929/11/24  DATE OF ADMISSION:  09/01/2015  PRIMARY CARE PHYSICIAN: Alejandro Carls, MD   REQUESTING/REFERRING PHYSICIAN: Dr. Jacqualine Armstrong  CHIEF COMPLAINT:   Chief Complaint  Patient presents with  . Abnormal Lab    HISTORY OF PRESENT ILLNESS: Alejandro Armstrong  is a 82 y.o. male with a known history of Idiopathic cytopenic purpura, hyperlipidemia, hypertension, hypothyroidism, renal agenesis and worsening renal disease. He presents to the ED today after he was called to present due to MRSA bacteremia that was noted on the blood cultures that were drawn during his ED visit 2 days ago. Patient has had a PICC line in situ due to need to administer chemotherapy (rituximab) for his ITP and notes he states that his PICC line site had been infected. He presented to the ED and PICC line was removed and patient was started on oral antibiotics but since blood cultures are growing MRSA, he was asked to present to the ED for admission for IV antibiotics. Wound cultures obtained at ER visit are also growing MRSA. He'll he complains of fatigue for the last 2 days but appetite has been normal. He denies any nausea and vomiting all changes in bowel or urine habits. He also denies any abdominal pain, chest pain, shortness of breath, diaphoresis, dizziness or fall. He is not bleeding from any orifice.  On arrival in the ED, vital signs are stable and CBC is at baseline. Platelet count is 165. CMP is significant for a potassium of 5.3, BUN of 57 and a creatinine of 4.02 compared to 2.252 weeks ago. Chest x-ray was negative and EKG showed normal sinus rhythm with no peaked T waves. Patient was given calcium gluconate in the ED, vancomycin as well as 1 L of normal saline. She'll be admitted for IV administration of vancomycin. Repeat blood cultures have been obtained.  His Armstrong STATUS is DO  NOT INTUBATE but we can administer medications, defibrillate and do CPR.   PAST MEDICAL HISTORY:   Past Medical History  Diagnosis Date  . Hyperlipidemia   . Hypertension   . Hypothyroidism   . Type 2 diabetes mellitus ([place])   . Renal agenesis     discovered at age 61  . Recurrent UTI   . Ureteral stricture   . Kidney stones     PAST SURGICAL HISTORY:  Past Surgical History  Procedure Laterality Date  . Kidney stone surgery      SOCIAL HISTORY:  Social History  Substance Use Topics  . Smoking status: Never Smoker   . Smokeless tobacco: Never Used  . Alcohol Use: No    FAMILY HISTORY:  Family History  Problem Relation Age of Onset  . Cervical cancer Sister     DRUG ALLERGIES: No Known Allergies  REVIEW OF SYSTEMS:   CONSTITUTIONAL: No fever. + weakness.  EYES: No blurred or double vision.  EARS, NOSE, AND THROAT: No tinnitus or ear pain.  RESPIRATORY: No cough, shortness of breath, wheezing or hemoptysis.  CARDIOVASCULAR: No chest pain, orthopnea, edema.  GASTROINTESTINAL: No nausea, vomiting, diarrhea or abdominal pain.  GENITOURINARY: No dysuria, hematuria.  ENDOCRINE: No polyuria, nocturia,  HEMATOLOGY: No anemia, easy bruising or bleeding SKIN: No rash or lesion. MUSCULOSKELETAL: No joint pain or arthritis.   NEUROLOGIC: No tingling, numbness, weakness.  PSYCHIATRY: No anxiety or depression.   MEDICATIONS AT HOME:  Prior to  Admission medications   Medication Sig Start Date End Date Taking? Authorizing Provider  amLODipine (NORVASC) 5 MG tablet Take 5 mg by mouth. 07/26/13  Yes Historical Provider, MD  calcitRIOL (ROCALTROL) 0.25 MCG capsule Take 0.25 mcg by mouth daily.   Yes Historical Provider, MD  cholecalciferol (VITAMIN D) 1000 units tablet Take 1,000 Units by mouth daily.   Yes Historical Provider, MD  ferrous sulfate 325 (65 FE) MG tablet Take 325 mg by mouth.   Yes Historical Provider, MD  insulin glargine (LANTUS) 100 UNIT/ML injection Inject  0.1 mLs (10 Units total) into the skin at bedtime. 08/08/15  Yes Fritzi Mandes, MD  levothyroxine (SYNTHROID, LEVOTHROID) 125 MCG tablet  08/07/14  Yes Historical Provider, MD  lisinopril (PRINIVIL,ZESTRIL) 20 MG tablet Take 20 mg by mouth. 06/24/12  Yes Historical Provider, MD  nystatin (MYCOSTATIN) 100000 UNIT/ML suspension Take 5 mLs (500,000 Units total) by mouth 4 (four) times daily. 08/16/15  Yes Mayra Reel, NP  Omega-3 1000 MG CAPS Take by mouth.   Yes Historical Provider, MD  polyethylene glycol (MIRALAX / GLYCOLAX) packet Take 17 g by mouth daily as needed for mild constipation. 08/08/15  Yes Fritzi Mandes, MD  sodium bicarbonate 650 MG tablet  08/07/14  Yes Historical Provider, MD  sulfamethoxazole-trimethoprim (BACTRIM DS,SEPTRA DS) 800-160 MG tablet Take 1 tablet by mouth 2 (two) times daily. 08/31/15  Yes Nance Pear, MD  clindamycin (CLEOCIN) 300 MG capsule Take 1 capsule (300 mg total) by mouth 4 (four) times daily. Patient not taking: Reported on 09/01/2015 08/30/15   Nance Pear, MD      PHYSICAL EXAMINATION:   VITAL SIGNS: Blood pressure 125/76, pulse 57, temperature 98.3 F (36.8 C), temperature source Oral, resp. rate 15, height 5\' 7"  (1.702 m), weight 79.833 kg (176 lb), SpO2 95 %.  GENERAL:  82 y.o.-year-old patient lying in the bed with no acute distress. Alert and oriented x 3. EYES: Pupils equal, round, reactive to light and accommodation. No scleral icterus. Extraocular muscles intact.  HEENT: Head atraumatic, normocephalic. Oropharynx and nasopharynx clear.  NECK:  Supple, no jugular venous distention. No thyroid enlargement, no tenderness.  LUNGS: Normal breath sounds bilaterally, no wheezing, rales,rhonchi or crepitation. No use of accessory muscles of respiration.  CARDIOVASCULAR: S1, S2 normal. No murmurs, rubs, or gallops.  ABDOMEN: Soft, nontender, nondistended. Bowel sounds present. No organomegaly or mass.  EXTREMITIES: 3+ pitting pedal edema bilaterally up to  knees. The anteromedial aspect of her right arm has some erythema and swelling with no decrease in range of motion or tenderness. NEUROLOGIC: Cranial nerves II through XII are intact. Muscle strength 5/5 in all extremities. Sensation intact. Gait not checked.   SKIN: No obvious rash, lesion, or ulcer.   LABORATORY PANEL:   CBC  Recent Labs Lab 08/30/15 2024 09/01/15 2052  WBC 10.3 8.9  HGB 9.9* 9.6*  HCT 29.2* 27.7*  PLT 120* 165  MCV 95.5 95.3  MCH 32.2 33.0  MCHC 33.8 34.6  RDW 14.3 14.3  LYMPHSABS 1.1 0.7*  MONOABS 0.8 0.5  EOSABS 0.0 0.0  BASOSABS 0.0 0.0   ------------------------------------------------------------------------------------------------------------------  Chemistries   Recent Labs Lab 08/30/15 2024 09/01/15 2052  NA 136 134*  K 5.1 5.3*  CL 107 105  CO2 20* 18*  GLUCOSE 121* 214*  BUN 54* 57*  CREATININE 3.33* 4.02*  CALCIUM 8.1* 7.5*   ------------------------------------------------------------------------------------------------------------------ estimated creatinine clearance is 14.1 mL/min (by C-G formula based on Cr of 4.02). ------------------------------------------------------------------------------------------------------------------ No results for input(s): TSH, T4TOTAL, T3FREE,  THYROIDAB in the last 72 hours.  Invalid input(s): FREET3   Coagulation profile No results for input(s): INR, PROTIME in the last 168 hours. ------------------------------------------------------------------------------------------------------------------- No results for input(s): DDIMER in the last 72 hours. -------------------------------------------------------------------------------------------------------------------  Cardiac Enzymes No results for input(s): CKMB, TROPONINI, MYOGLOBIN in the last 168 hours.  Invalid input(s):  CK ------------------------------------------------------------------------------------------------------------------ Invalid input(s): POCBNP  ---------------------------------------------------------------------------------------------------------------  Urinalysis    Component Value Date/Time   COLORURINE STRAW* 08/05/2015 2114   COLORURINE Yellow 10/15/2012 2114   APPEARANCEUR CLEAR* 08/05/2015 2114   APPEARANCEUR Cloudy 10/15/2012 2114   LABSPEC 1.014 08/05/2015 2114   LABSPEC 1.014 10/15/2012 2114   PHURINE 6.0 08/05/2015 2114   PHURINE 5.0 10/15/2012 2114   GLUCOSEU >500* 08/05/2015 2114   GLUCOSEU Negative 10/15/2012 2114   HGBUR 1+* 08/05/2015 2114   HGBUR 1+ 10/15/2012 2114   BILIRUBINUR NEGATIVE 08/05/2015 2114   BILIRUBINUR Negative 10/15/2012 2114   [place] NEGATIVE 08/05/2015 2114   KETONESUR Negative 10/15/2012 2114   PROTEINUR 100* 08/05/2015 2114   PROTEINUR 100 mg/dL 10/15/2012 2114   NITRITE NEGATIVE 08/05/2015 2114   NITRITE Positive 10/15/2012 2114   LEUKOCYTESUR TRACE* 08/05/2015 2114   LEUKOCYTESUR 3+ 10/15/2012 2114     RADIOLOGY: Dg Chest Port 1 View  09/01/2015  CLINICAL DATA:  Recent removal of the PICC line after developing erythema at the site. Now with developing weakness. EXAM: PORTABLE CHEST 1 VIEW COMPARISON:  08/03/2015 FINDINGS: Mildly prominent right hilar contours are likely vascular, accentuated due to the AP portable acquisition. The lungs are clear. Pulmonary vasculature is normal. There is no large effusion. Heart size is upper normal, unchanged. IMPRESSION: No acute cardiopulmonary findings. Electronically Signed   By: Andreas Newport M.D.   On: 09/01/2015 21:13    EKG: Orders placed or performed during the hospital encounter of 09/01/15  . ED EKG  . ED EKG  . EKG 12-Lead  . EKG 12-Lead    ASSESSMENT  Principal Problem:   MRSA bacteremia secondary to PICC line infection Active Problems:   ITP (idiopathic thrombocytopenic  purpura)   Acute on chronic renal insufficiency (HCC)   Hyperkalemia   Type 2 diabetes mellitus ([place])  PLAN   1). MRSA bacteremia secondary to PICC line infection - Patient blood cultures are growing MRSA and this is consistent with 1 cultures. This is thought to be secondary to PICC line infection as patient had a PICC line due to her menstruation of rituximab for ITP. - Discontinue Bactrim and start patient on IV vancomycin. Pharmacy to dose her renal status. - Follow up results of repeat blood cultures and adjust antibiotics as necessary.  2). Acute on chronic renal insufficiency  - Patient has continuously worsening creatinine levels and today creatinine is 4.02. 2 weeks ago was 2.25. Etiology of acutely worsening creatinine is unclear but patient has a history of renal agenesis. Due to patient's bilateral pitting pedal edema, worsening creatinine may be due to fluid overload. I will give a trial dose of Lasix. - Consult nephrology - Obtain renal workup-renal ultrasound, fractional excretion of sodium, eosinophils (urine) and we will avoid nephrotoxic medications. - Hold lisinopril. - Measure input and outputs and elevate patient's feet.  3). Hyperkalemia - Patient had a potassium of 5.3 on arrival. No EKG changes associated with hyperkalemia was noted. - Received calcium gluconate in the ED. - I ordered for Kayexalate one time. Monitor potassium levels.  4). ITP (idiopathic thrombocytopenic purpura) - Monitor platelet count closely. Today is 165. - Follow up with oncology  after discharge.  4). Type 2 diabetes mellitus - Continue Lantus 10 units daily at bedtime and start sliding scale insulin. Check A1c and continue consistent carbohydrate diet  All the records are reviewed and case discussed with ED provider. Management plans discussed with the patient, family and they are in agreement.  Armstrong STATUS: Armstrong Status History    Date Active Date Inactive Armstrong Status Order ID  Comments User Context   08/06/2015  1:41 AM 08/08/2015  2:33 PM Full Armstrong [id number]  Hillary Bow, MD ED   08/03/2015  2:23 PM 08/04/2015  1:52 PM Full Armstrong [id number]  [name], MD Inpatient   07/24/2015 12:35 PM 07/29/2015  5:12 PM Full Armstrong [id number]  Lytle Butte, MD Inpatient    Advance Directive Documentation        Most Recent Value   Type of Advance Directive  Living will   Pre-existing out of facility DNR order (yellow form or pink MOST form)     "MOST" Form in Place?        I have independently reviewed all EKG and chest x-ray data  VTE prophylaxis: Heparin if no contraindications and patient at low risk for bleeding. SCD's and progressive ambulation if patient has contraindications to anticoagulants. No DVT prophylaxis if patient presently receiving therapeutic anticoagulation or is at significant risk of bleeding for which the risk of anticoagulation outweigh the potential benefits.  Vaccinations: Pneumonia & flu vaccine per hospital protocol Prevention: Will proceed with conservative measures for the prevention of delirium in patients older than 65. Fall precautions and 1:1 sitter as needed per hospital protocol.  TOTAL TIME TAKING CARE OF THIS PATIENT: 50 minutes.    Sylvan Cheese M.D on 09/01/2015 at 10:49 PM  Between 7am to 6pm - Pager - [phone]  After 6pm go to [email or web address] - password [email or web address] [facility]  Office  [phone]  CC: Primary care physician; Alejandro Carls, MD

## 2015-09-01 NOTE — ED Provider Notes (Signed)
[facility] Emergency Department Provider Note  ____________________________________________  Time seen: Approximately 8:25 PM  I have reviewed the triage vital signs and the nursing notes.   HISTORY  Chief Complaint Abnormal Lab    HPI Alejandro Armstrong is a 82 y.o. male presents for evaluation of weakness for the last 48 hours and also advised to calm for antibiotics and admission.  The patient started developing redness and soreness around his PICC site in the right arm a few days ago. He came to the ER had blood cultures done (cultures reviewed and looked very suspicious for MRSA).  He reports ongoing fatigue, generalized weakness. Did switch antibiotics to Bactrim at home, but reports she is getting worse feeling more tired and generally weak.  Continues to have redness pain and some swelling around the PICC insertion site of the right arm. He has been bandaging it but not really noticing much drainage.  Denies fever or chills just generally feels weak and tired and has no energy.    Past Medical History  Diagnosis Date  . Hyperlipidemia   . Hypertension   . Hypothyroidism   . Type 2 diabetes mellitus ([place])   . Renal agenesis     discovered at age 10  . Recurrent UTI   . Ureteral stricture   . Kidney stones     Patient Active Problem List   Diagnosis Date Noted  . UTI (lower urinary tract infection) 08/06/2015  . ITP (idiopathic thrombocytopenic purpura) 08/02/2015  . Thrombocytopenia ([place]) 07/24/2015  . B12 deficiency 07/24/2015    Past Surgical History  Procedure Laterality Date  . Kidney stone surgery      Current Outpatient Rx  Name  Route  Sig  Dispense  Refill  . amLODipine (NORVASC) 5 MG tablet   Oral   Take 5 mg by mouth.         . calcitRIOL (ROCALTROL) 0.25 MCG capsule   Oral   Take 0.25 mcg by mouth daily.         . cholecalciferol (VITAMIN D) 1000 units tablet   Oral   Take 1,000 Units by mouth daily.          . ferrous sulfate 325 (65 FE) MG tablet   Oral   Take 325 mg by mouth.         . insulin glargine (LANTUS) 100 UNIT/ML injection   Subcutaneous   Inject 0.1 mLs (10 Units total) into the skin at bedtime.   10 mL   11   . levothyroxine (SYNTHROID, LEVOTHROID) 125 MCG tablet               . lisinopril (PRINIVIL,ZESTRIL) 20 MG tablet   Oral   Take 20 mg by mouth.         . nystatin (MYCOSTATIN) 100000 UNIT/ML suspension   Oral   Take 5 mLs (500,000 Units total) by mouth 4 (four) times daily.   60 mL   1   . Omega-3 1000 MG CAPS   Oral   Take by mouth.         . polyethylene glycol (MIRALAX / GLYCOLAX) packet   Oral   Take 17 g by mouth daily as needed for mild constipation.   14 each   0   . sodium bicarbonate 650 MG tablet               . sulfamethoxazole-trimethoprim (BACTRIM DS,SEPTRA DS) 800-160 MG tablet   Oral   Take 1  tablet by mouth 2 (two) times daily.   20 tablet   0   . clindamycin (CLEOCIN) 300 MG capsule   Oral   Take 1 capsule (300 mg total) by mouth 4 (four) times daily. Patient not taking: Reported on 09/01/2015   30 capsule   0     Allergies Review of patient's allergies indicates no known allergies.  Family History  Problem Relation Age of Onset  . Cervical cancer Sister     Social History Social History  Substance Use Topics  . Smoking status: Never Smoker   . Smokeless tobacco: Never Used  . Alcohol Use: No    Review of Systems Constitutional: No fever/chills Eyes: No visual changes ENT: No sore throat Cardiovascular: Denies chest pain Respiratory: Denies shortness of breath Gastrointestinal: No abdominal pain.  No nausea, no vomiting.  No diarrhea.  No constipation. Genitourinary: Negative for dysuria. Musculoskeletal: Negative for back pain. Skin: Negative for rash. Neurological: Negative for headaches, focal weakness or numbness.  10-point ROS otherwise  negative.  ____________________________________________   PHYSICAL EXAM:  VITAL SIGNS: ED Triage Vitals  Enc Vitals Group     BP 09/01/15 2002 129/60 mmHg     Pulse Rate 09/01/15 2002 73     Resp 09/01/15 2002 20     Temp 09/01/15 2002 98.3 F (36.8 C)     Temp Source 09/01/15 2002 Oral     SpO2 09/01/15 2002 99 %     Weight 09/01/15 2002 176 lb (79.833 kg)     Height 09/01/15 2002 5\' 7"  (1.702 m)     Head Cir --      Peak Flow --      Pain Score 09/01/15 2003 1     Pain Loc --      Pain Edu? --      Excl. in [place]? --    Constitutional: Alert and oriented. Well appearing and in no acute distress. Does appear generally fatigued. Eyes: Conjunctivae are normal. PERRL. EOMI. Head: Atraumatic. Nose: No congestion/rhinnorhea. Mouth/Throat: Mucous membranes are moist.  Oropharynx non-erythematous. Neck: No stridor.   Cardiovascular: Normal rate, regular rhythm. Grossly normal heart sounds.  Good peripheral circulation. Respiratory: Normal respiratory effort.  No retractions. Lungs CTAB. Gastrointestinal: Soft and nontender. No distention. No abdominal bruits. No CVA tenderness. Musculoskeletal: No lower extremity tenderness nor edema.  No joint effusions. Right upper extremity just above the antecubital region has palmer size patch of erythema and slight induration. Neurologic:  Normal speech and language. No gross focal neurologic deficits are appreciated. Skin:  Skin is warm, dry and intact. No rash noted. Psychiatric: Mood and affect are normal. Speech and behavior are normal.  ____________________________________________   LABS (all labs ordered are listed, but only abnormal results are displayed)  Labs Reviewed  CBC WITH DIFFERENTIAL/PLATELET - Abnormal; Notable for the following:    RBC 2.90 (*)    Hemoglobin 9.6 (*)    HCT 27.7 (*)    Neutro Abs 7.7 (*)    Lymphs Abs 0.7 (*)    All other components within normal limits  BASIC METABOLIC PANEL - Abnormal; Notable for  the following:    Sodium 134 (*)    Potassium 5.3 (*)    CO2 18 (*)    Glucose, Bld 214 (*)    BUN 57 (*)    Creatinine, Ser 4.02 (*)    Calcium 7.5 (*)    GFR calc non Af Amer 13 (*)    GFR calc Af [place] Lora  15 (*)    All other components within normal limits  CULTURE, BLOOD (ROUTINE X 2)  CULTURE, BLOOD (ROUTINE X 2)   ____________________________________________  EKG   ____________________________________________  RADIOLOGY  DG Chest Port 1 View (Final result) Result time: 09/01/15 21:13:20   Final result by Rad Results In Interface (09/01/15 21:13:20)   Narrative:   CLINICAL DATA: Recent removal of the PICC line after developing erythema at the site. Now with developing weakness.  EXAM: PORTABLE CHEST 1 VIEW  COMPARISON: 08/03/2015  FINDINGS: Mildly prominent right hilar contours are likely vascular, accentuated due to the AP portable acquisition. The lungs are clear. Pulmonary vasculature is normal. There is no large effusion. Heart size is upper normal, unchanged.  IMPRESSION: No acute cardiopulmonary findings.   Electronically Signed By: Andreas Newport M.D. On: 09/01/2015 21:13    ____________________________________________   PROCEDURES  Procedure(s) performed: None  Critical Care performed: No  ____________________________________________   INITIAL IMPRESSION / ASSESSMENT AND PLAN / ED COURSE  Pertinent labs & imaging results that were available during my care of the patient were reviewed by me and considered in my medical decision making (see chart for details).  Patient presents for concerns of positive blood cultures. Reviewed and appears the patient is growing MRSA. Patient is a taking Bactrim at home, however reports not improving and worsening weakness. Based on his age, known positive blood cultures I will admit him to the hospital for IV antibiotics including vancomycin.  He is hemodynamically stable at this time. We'll  obtain a chest x-ray as a rule out other source.  ----------------------------------------- 10:17 PM on 09/01/2015 -----------------------------------------  Labs reviewed and creatinine slowly continuing to worsen. Potassium slightly elevated at 5.3, we'll give calcium. In addition patient be admitted for bacteremia. Provide hydration for worsening renal function. Discussed and admitted to hospitalist service. ____________________________________________   FINAL CLINICAL IMPRESSION(S) / ED DIAGNOSES  Final diagnoses:  Acute on chronic renal insufficiency (HCC)  Hyperkalemia  Bacteremia      Delman Kitten, MD 09/01/15 2218

## 2015-09-01 NOTE — ED Notes (Signed)
Pt presents to ED after he was called by his MD with positive blood culture. Pt was told to come in for possible admission for iv antibiotics. Pt currently denies pain and fever. Pt was seen in this ED Thursday night for pain and drainage from his picc line. Pt states he had been feeling weak and was told by home health to come be evaluated.

## 2015-09-01 NOTE — Telephone Encounter (Signed)
Unable to get through to call patient to come back to the ED for admission and management of staph aureus bacteremia. i have called the charge nurse at [facility] ED to help reach out to patient to come to ED. i will call again at 6 pm to see if he has been contacted.

## 2015-09-01 NOTE — ED Notes (Signed)
Attempted to call report to 2A at 40 minutes status time.  This nurse was told that the room has not been approved yet by the charge nurse and the patient may be moved to a different room for admission.

## 2015-09-02 ENCOUNTER — Inpatient Hospital Stay (HOSPITAL_COMMUNITY)
Admit: 2015-09-02 | Discharge: 2015-09-02 | Disposition: A | Discharge status: Home / Self Care | Payer: Medicare HMO | Attending: Internal Medicine | Admitting: Internal Medicine

## 2015-09-02 ENCOUNTER — Inpatient Hospital Stay: Payer: Medicare HMO

## 2015-09-02 ENCOUNTER — Other Ambulatory Visit: Payer: Self-pay

## 2015-09-02 DIAGNOSIS — A4101 Sepsis due to Methicillin susceptible Staphylococcus aureus: Secondary | ICD-10-CM

## 2015-09-02 LAB — CBC
HCT: 25.3 % — ABNORMAL LOW (ref 40.0–52.0)
HEMOGLOBIN: 8.6 g/dL — AB (ref 13.0–18.0)
MCH: 32.5 pg (ref 26.0–34.0)
MCHC: 34.1 g/dL (ref 32.0–36.0)
MCV: 95.6 fL (ref 80.0–100.0)
Platelets: 158 10*3/uL (ref 150–440)
RBC: 2.65 MIL/uL — AB (ref 4.40–5.90)
RDW: 14.1 % (ref 11.5–14.5)
WBC: 7.3 10*3/uL (ref 3.8–10.6)

## 2015-09-02 LAB — CULTURE, BLOOD (ROUTINE X 2)

## 2015-09-02 LAB — BASIC METABOLIC PANEL
ANION GAP: 8 (ref 5–15)
BUN: 52 mg/dL — ABNORMAL HIGH (ref 6–20)
CALCIUM: 7.5 mg/dL — AB (ref 8.9–10.3)
CHLORIDE: 111 mmol/L (ref 101–111)
CO2: 19 mmol/L — AB (ref 22–32)
CREATININE: 3.71 mg/dL — AB (ref 0.61–1.24)
GFR calc Af Amer: 16 mL/min — ABNORMAL LOW (ref >60)
GFR calc non Af Amer: 14 mL/min — ABNORMAL LOW (ref >60)
GLUCOSE: 59 mg/dL — AB (ref 65–99)
Potassium: 4.2 mmol/L (ref 3.5–5.1)
Sodium: 138 mmol/L (ref 135–145)

## 2015-09-02 LAB — WOUND CULTURE

## 2015-09-02 LAB — GLUCOSE, CAPILLARY
GLUCOSE-CAPILLARY: 141 mg/dL — AB (ref 65–99)
Glucose-Capillary: 135 mg/dL — ABNORMAL HIGH (ref 65–99)
Glucose-Capillary: 136 mg/dL — ABNORMAL HIGH (ref 65–99)
Glucose-Capillary: 160 mg/dL — ABNORMAL HIGH (ref 65–99)
Glucose-Capillary: 76 mg/dL (ref 65–99)

## 2015-09-02 LAB — TSH: TSH: 14.91 u[IU]/mL — ABNORMAL HIGH (ref 0.350–4.500)

## 2015-09-02 LAB — ECHOCARDIOGRAM COMPLETE
Height: 67 in
Weight: 2945.35 oz

## 2015-09-02 LAB — CREATININE, URINE, RANDOM: Creatinine, Urine: 40 mg/dL

## 2015-09-02 LAB — T4, FREE: FREE T4: 0.73 ng/dL (ref 0.61–1.12)

## 2015-09-02 LAB — SODIUM, URINE, RANDOM: SODIUM UR: 100 mmol/L

## 2015-09-02 MED ORDER — SODIUM CHLORIDE 0.9 % IV SOLN
INTRAVENOUS | Status: DC
  Administered 2015-09-02 – 2015-09-05 (×5): via INTRAVENOUS

## 2015-09-02 MED ORDER — VANCOMYCIN HCL IN DEXTROSE 1-5 GM/200ML-% IV SOLN
1000.0000 mg | INTRAVENOUS | Status: DC
  Filled 2015-09-02 (×2): qty 200

## 2015-09-02 MED ORDER — INSULIN GLARGINE 100 UNIT/ML ~~LOC~~ SOLN
10.0000 [IU] | Freq: Every day | SUBCUTANEOUS | Status: DC
  Administered 2015-09-03 – 2015-09-04 (×2): 10 [IU] via SUBCUTANEOUS
  Filled 2015-09-02 (×2): qty 0.1

## 2015-09-02 NOTE — Plan of Care (Signed)
Problem: Education: Goal: Knowledge of [facility] Education information/materials will improve Outcome: Progressing Pt oriented to room and equipment, fall precautions in place. RX to dose vancomycin.  Problem: Safety: Goal: Ability to remain free from injury will improve Outcome: Progressing Pt free from falls, bed alarm set. Pt oob with assist x 1 and cane.  Problem: Skin Integrity: Goal: Risk for impaired skin integrity will decrease Outcome: Progressing See assessment. Pt able to turn self in bed.  Problem: Tissue Perfusion: Goal: Risk factors for ineffective tissue perfusion will decrease Outcome: Progressing VTE: heparin sub q  Problem: Activity: Goal: Risk for activity intolerance will decrease Outcome: Progressing Up with 1 person assist, cane at bedside.  Problem: Physical Regulation: Goal: Signs and symptoms of infection will decrease Outcome: Progressing Cont to monitor WBC and administer IV abx per MD order.

## 2015-09-02 NOTE — Progress Notes (Signed)
*  PRELIMINARY RESULTS* Echocardiogram 2D Echocardiogram has been performed.  Alejandro Armstrong 09/02/2015, 5:18 PM

## 2015-09-02 NOTE — Progress Notes (Signed)
[facility] NAME: Alejandro Armstrong    MR#:  [id number]  DATE OF BIRTH:  1930/05/03  SUBJECTIVE:  CHIEF COMPLAINT:   Chief Complaint  Patient presents with  . Abnormal Lab   - Patient admitted with MRSA bacteremia, recent PICC line that was infected and removed in the emergency room 2 days ago. -No further fevers now. Repeat blood cultures ordered for tomorrow. -Also noted to have acute renal failure. No urgent indication for hemodialysis. Continue to monitor. Denies any complaints at this time.  REVIEW OF SYSTEMS:  Review of Systems  Constitutional: Positive for malaise/fatigue. Negative for fever and chills.  HENT: Negative for ear discharge, ear pain and nosebleeds.   Eyes: Negative for blurred vision and double vision.  Respiratory: Negative for cough, shortness of breath and wheezing.   Cardiovascular: Positive for leg swelling. Negative for chest pain and palpitations.  Gastrointestinal: Negative for nausea, vomiting, abdominal pain, diarrhea and constipation.  Genitourinary: Negative for dysuria and urgency.  Musculoskeletal: Negative for myalgias.  Neurological: Negative for dizziness, tremors, speech change, focal weakness, seizures and headaches.  Psychiatric/Behavioral: Negative for depression.    DRUG ALLERGIES:  No Known Allergies  VITALS:  Blood pressure 126/66, pulse 64, temperature 98 F (36.7 C), temperature source Oral, resp. rate 18, height 5\' 7"  (1.702 m), weight 83.5 kg (184 lb 1.4 oz), SpO2 98 %.  PHYSICAL EXAMINATION:  Physical Exam  GENERAL:  82 y.o.-year-old patient lying in the bed with no acute distress.  EYES: Pupils equal, round, reactive to light and accommodation. No scleral icterus. Extraocular muscles intact.  HEENT: Head atraumatic, normocephalic. Oropharynx and nasopharynx clear.  NECK:  Supple, no jugular venous distention. No thyroid enlargement, no tenderness.  LUNGS: Normal  breath sounds bilaterally, no wheezing, rales,rhonchi or crepitation. No use of accessory muscles of respiration. Decreased bibasilar breath sounds noted CARDIOVASCULAR: S1, S2 normal. No rubs, or gallops. 3/6 systolic murmur is present ABDOMEN: Soft, nontender, nondistended. Bowel sounds present. No organomegaly or mass.  EXTREMITIES: No cyanosis, or clubbing. 2+ edema noted in the extremities NEUROLOGIC: Cranial nerves II through XII are intact. Muscle strength 5/5 in all extremities. Sensation intact. Gait not checked.  PSYCHIATRIC: The patient is alert and oriented x 3.  SKIN: No obvious rash, lesion, or ulcer. Dry and flaky skin noted   LABORATORY PANEL:   CBC  Recent Labs Lab 09/02/15 0631  WBC 7.3  HGB 8.6*  HCT 25.3*  PLT 158   ------------------------------------------------------------------------------------------------------------------  Chemistries   Recent Labs Lab 09/02/15 0631  NA 138  K 4.2  CL 111  CO2 19*  GLUCOSE 59*  BUN 52*  CREATININE 3.71*  CALCIUM 7.5*   ------------------------------------------------------------------------------------------------------------------  Cardiac Enzymes No results for input(s): TROPONINI in the last 168 hours. ------------------------------------------------------------------------------------------------------------------  RADIOLOGY:  US Renal  09/02/2015  CLINICAL DATA:  Renal failure EXAM: RENAL / URINARY TRACT ULTRASOUND COMPLETE COMPARISON:  CT 08/06/2015 FINDINGS: Right Kidney: Absent. Left Kidney: Length: 16.3 cm. Echogenic, with parenchymal thinning consistent with atrophy. Lower pole cortical calcifications are present, similar to the appearances on CT. No conclusive collecting system calculi. No hydronephrosis per Bladder: Appears normal for degree of bladder distention. Left ureteral jet observed. IMPRESSION: Absent right kidney. Left kidney appears atrophic with echogenic parenchyma consistent with  medical renal disease. No hydronephrosis. Left ureteral jet documented at the urinary bladder. Electronically Signed   By: Andreas Newport M.D.   On: 09/02/2015 01:54   Dg Chest Port 1  View  09/01/2015  CLINICAL DATA:  Recent removal of the PICC line after developing erythema at the site. Now with developing weakness. EXAM: PORTABLE CHEST 1 VIEW COMPARISON:  08/03/2015 FINDINGS: Mildly prominent right hilar contours are likely vascular, accentuated due to the AP portable acquisition. The lungs are clear. Pulmonary vasculature is normal. There is no large effusion. Heart size is upper normal, unchanged. IMPRESSION: No acute cardiopulmonary findings. Electronically Signed   By: Andreas Newport M.D.   On: 09/01/2015 21:13    EKG:   Orders placed or performed during the hospital encounter of 09/01/15  . ED EKG  . ED EKG  . EKG 12-Lead  . EKG 12-Lead  . EKG test  . EKG test    ASSESSMENT AND PLAN:   82 year old male with past medical history significant for ITP, hypertension, hyperlipidemia, CKD baseline creatinine of 2.5 was brought in for MRSA bacteremia from an infected PICC line for his rituxan treatments  #1 MRSA bacteremia-secondary to infected PICC line. PICC line was taken out 2 days ago in the emergency room. -Continue IV vancomycin. Monitor renal function. -ID consulted. -Repeat blood cultures ordered for tomorrow to ensure clearing of MRSA. -Echocardiogram to rule out endocarditis  #2 acute on chronic renal insufficiency-baseline creatinine around 2.2. -Creatinine on admission was 4, likely ATN from sepsis. -Appreciate nephrology consult. No acute indication for hemodialysis. Gentle hydration today. -Creatinine is improving. Avoid nephrotoxins -Renal ultrasound ordered. Continue to monitor input and outputs  #3 hypokalemia-secondary to acute renal failure. Improved after treatment yesterday.  #4 elevated TSH- free t4 within in normal limits. Likely from acute infection.  Monitor -Continue Synthroid  #5 anemia of chronic disease-stable. Continue to monitor -Continue iron supplements  #6 DVT prophylaxis-on subcutaneous heparin  PT consult. Lives at home with his wife. Uses a cane to ambulate.    All the records are reviewed and case discussed with Care Management/Social Workerr. Management plans discussed with the patient, family and they are in agreement.  CODE STATUS: Full Code  TOTAL TIME TAKING CARE OF THIS PATIENT: 37 minutes.   POSSIBLE D/C IN 2 DAYS, DEPENDING ON CLINICAL CONDITION.   Gladstone Lighter M.D on 09/02/2015 at 1:33 PM  Between 7am to 6pm - Pager - [phone]  After 6pm go to [email or web address] - password [email or web address] [facility]  Office  [phone]  CC: Primary care physician; Casilda Carls, MD

## 2015-09-02 NOTE — Progress Notes (Signed)
[facility]  ROUNDING NOTE   Subjective:  Pt well known to [place]. We follow him for CKD stage IV. Recently started on rituximab for ITP. He had a PICC line placed.  Now has MRSA sepsis. Also now with ARF.   Baseline Cr 2.5.   Objective:  Vital signs in last 24 hours:  Temp:  [97.7 F (36.5 C)-98.3 F (36.8 C)] 98 F (36.7 C) (03/19 1119) Pulse Rate:  [57-73] 64 (03/19 1119) Resp:  [14-22] 18 (03/19 1119) BP: (119-145)/(60-84) 126/66 mmHg (03/19 1119) SpO2:  [93 %-100 %] 98 % (03/19 1119) Weight:  [79.833 kg (176 lb)-83.5 kg (184 lb 1.4 oz)] 83.5 kg (184 lb 1.4 oz) (03/19 0500)  Weight change:  Filed Weights   09/01/15 2002 09/01/15 2357 09/02/15 0500  Weight: 79.833 kg (176 lb) 81.829 kg (180 lb 6.4 oz) 83.5 kg (184 lb 1.4 oz)    Intake/Output: I/O last 3 completed shifts: In: 1030 [P.O.:30; IV Piggyback:1000] Out: 550 [Urine:550]   Intake/Output this shift:  Total I/O In: -  Out: 400 [Urine:400]  Physical Exam: General: NAD, resting in bed.  Head: Normocephalic, atraumatic. Moist oral mucosal membranes  Eyes: Anicteric  Neck: Supple, trachea midline  Lungs:  Clear to auscultation, normal effort  Heart: Regular rate and rhythm no rubs  Abdomen:  Soft, nontender, BS present  Extremities: 1+ peripheral edema, erythema RUE where picc was  Neurologic: Nonfocal, moving all four extremities  Skin: No lesions       Basic Metabolic Panel:  Recent Labs Lab 08/30/15 2024 09/01/15 2052 09/02/15 0631  NA 136 134* 138  K 5.1 5.3* 4.2  CL 107 105 111  CO2 20* 18* 19*  GLUCOSE 121* 214* 59*  BUN 54* 57* 52*  CREATININE 3.33* 4.02* 3.71*  CALCIUM 8.1* 7.5* 7.5*    Liver Function Tests: No results for input(s): AST, ALT, ALKPHOS, BILITOT, PROT, ALBUMIN in the last 168 hours. No results for input(s): LIPASE, AMYLASE in the last 168 hours. No results for input(s): AMMONIA in the last 168 hours.  CBC:  Recent Labs Lab 08/30/15 2024  09/01/15 2052 09/02/15 0631  WBC 10.3 8.9 7.3  NEUTROABS 8.3* 7.7*  --   HGB 9.9* 9.6* 8.6*  HCT 29.2* 27.7* 25.3*  MCV 95.5 95.3 95.6  PLT 120* 165 158    Cardiac Enzymes: No results for input(s): CKTOTAL, CKMB, CKMBINDEX, TROPONINI in the last 168 hours.  BNP: Invalid input(s): POCBNP  CBG:  Recent Labs Lab 08/30/15 1918 09/02/15 0013 09/02/15 0755 09/02/15 1117 09/02/15 1708  GLUCAP 146* 135* 46 136* 141*    Microbiology: Results for orders placed or performed during the hospital encounter of 09/01/15  Culture, blood (Routine X 2) w Reflex to ID Panel     Status: None (Preliminary result)   Collection Time: 09/01/15  8:52 PM  Result Value Ref Range Status   Specimen Description BLOOD LEFT WRIST  Final   Special Requests   Final    BOTTLES DRAWN AEROBIC AND ANAEROBIC [id number],9CCANA   Culture NO GROWTH < 24 HOURS  Final   Report Status PENDING  Incomplete  Culture, blood (Routine X 2) w Reflex to ID Panel     Status: None (Preliminary result)   Collection Time: 09/01/15  8:53 PM  Result Value Ref Range Status   Specimen Description BLOOD RIGHT WRIST  Final   Special Requests BOTTLES DRAWN AEROBIC AND ANAEROBIC [place]  Final   Culture NO GROWTH < 24 HOURS  Final  Report Status PENDING  Incomplete    Coagulation Studies: No results for input(s): LABPROT, INR in the last 72 hours.  Urinalysis: No results for input(s): COLORURINE, LABSPEC, PHURINE, GLUCOSEU, HGBUR, BILIRUBINUR, KETONESUR, PROTEINUR, UROBILINOGEN, NITRITE, LEUKOCYTESUR in the last 72 hours.  Invalid input(s): APPERANCEUR    Imaging: US Renal  09/02/2015  CLINICAL DATA:  Renal failure EXAM: RENAL / URINARY TRACT ULTRASOUND COMPLETE COMPARISON:  CT 08/06/2015 FINDINGS: Right Kidney: Absent. Left Kidney: Length: 16.3 cm. Echogenic, with parenchymal thinning consistent with atrophy. Lower pole cortical calcifications are present, similar to the appearances on CT. No conclusive collecting  system calculi. No hydronephrosis per Bladder: Appears normal for degree of bladder distention. Left ureteral jet observed. IMPRESSION: Absent right kidney. Left kidney appears atrophic with echogenic parenchyma consistent with medical renal disease. No hydronephrosis. Left ureteral jet documented at the urinary bladder. Electronically Signed   By: Andreas Newport M.D.   On: 09/02/2015 01:54   Dg Chest Port 1 View  09/01/2015  CLINICAL DATA:  Recent removal of the PICC line after developing erythema at the site. Now with developing weakness. EXAM: PORTABLE CHEST 1 VIEW COMPARISON:  08/03/2015 FINDINGS: Mildly prominent right hilar contours are likely vascular, accentuated due to the AP portable acquisition. The lungs are clear. Pulmonary vasculature is normal. There is no large effusion. Heart size is upper normal, unchanged. IMPRESSION: No acute cardiopulmonary findings. Electronically Signed   By: Andreas Newport M.D.   On: 09/01/2015 21:13     Medications:     . amLODipine  5 mg Oral Daily  . calcitRIOL  0.25 mcg Oral Daily  . cholecalciferol  1,000 Units Oral Daily  . ferrous sulfate  325 mg Oral BID WC  . heparin  5,000 Units Subcutaneous 3 times per day  . insulin aspart  0-15 Units Subcutaneous TID WC  . insulin glargine  10 Units Subcutaneous QHS  . levothyroxine  125 mcg Oral QAC breakfast  . sodium bicarbonate  650 mg Oral Daily  . vancomycin  1,000 mg Intravenous Q36H   polyethylene glycol  Assessment/ Plan:  82 y.o. 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   1. Chronic kidney disease stage IV/congenitally absent right kidney/proteinuria/ARF due to MRSA sepsis 2. Hyperkalemia 3. Diabetes with  CKD 4. MRSA sepsis due to infected PICC. 5.  ITP treated with prednisone and rituximab.  Plan: Patient presented with infected PICC.  Would try to exclude endocarditis.  Renal function worse now due to MRSA sepsis but improving.  Start 0.9 NS at 50cc/hr.  K was high at 5.3, but down to 4.2.  May need to consider restarting veltessa.  No indication for HD at the moment.  Treatment of ITP per hematology.  Will follow closely.   LOS: 1 Aarion Kittrell 3/19/20175:31 PM

## 2015-09-02 NOTE — Progress Notes (Signed)
ANTIBIOTIC CONSULT NOTE - INITIAL  Pharmacy Consult for Vancomycin  Indication: sepsis  No Known Allergies  Patient Measurements: Height: 5\' 7"  (170.2 cm) Weight: 176 lb (79.833 kg) IBW/kg (Calculated) : 66.1 Adjusted Body Weight: 71.58 kg   Vital Signs: Temp: 97.8 F (36.6 C) (03/18 2357) Temp Source: Oral (03/18 2357) BP: 136/83 mmHg (03/18 2357) Pulse Rate: 59 (03/18 2357) Intake/Output from previous day: 03/18 0701 - 03/19 0700 In: 1000 [IV Piggyback:1000] Out: -  Intake/Output from this shift: Total I/O In: 1000 [IV Piggyback:1000] Out: -   Labs:  Recent Labs  08/30/15 2024 09/01/15 2052  WBC 10.3 8.9  HGB 9.9* 9.6*  PLT 120* 165  CREATININE 3.33* 4.02*   Estimated Creatinine Clearance: 14.1 mL/min (by C-G formula based on Cr of 4.02). No results for input(s): VANCOTROUGH, VANCOPEAK, VANCORANDOM, GENTTROUGH, GENTPEAK, GENTRANDOM, TOBRATROUGH, TOBRAPEAK, TOBRARND, AMIKACINPEAK, AMIKACINTROU, AMIKACIN in the last 72 hours.   Microbiology: Recent Results (from the past 720 hour(s))  Urine culture     Status: None   Collection Time: 08/05/15  9:14 PM  Result Value Ref Range Status   Specimen Description URINE, RANDOM  Final   Special Requests NONE  Final   Culture 1,000 COLONIES/mL INSIGNIFICANT GROWTH  Final   Report Status 08/07/2015 FINAL  Final  C difficile quick scan w PCR reflex     Status: None   Collection Time: 08/06/15  2:35 AM  Result Value Ref Range Status   C Diff antigen NEGATIVE NEGATIVE Final   C Diff toxin NEGATIVE NEGATIVE Final   C Diff interpretation Negative for C. difficile  Final  Blood culture (routine x 2)     Status: None (Preliminary result)   Collection Time: 08/30/15  8:20 PM  Result Value Ref Range Status   Specimen Description BLOOD LEFT HAND  Final   Special Requests BOTTLES DRAWN AEROBIC AND ANAEROBIC 5ML  Final   Culture  Setup Time   Final    GRAM POSITIVE COCCI IN BOTH AEROBIC AND ANAEROBIC BOTTLES PREVIOUSLY CALLED  AT [id number] 08/31/15 BY [facility].    Culture   Final    GRAM POSITIVE COCCI IN BOTH AEROBIC AND ANAEROBIC BOTTLES IDENTIFICATION AND SUSCEPTIBILITIES TO FOLLOW    Report Status PENDING  Incomplete  Blood culture (routine x 2)     Status: None (Preliminary result)   Collection Time: 08/30/15  8:45 PM  Result Value Ref Range Status   Specimen Description BLOOD RIGHT HAND  Final   Special Requests BOTTLES DRAWN AEROBIC AND ANAEROBIC 5ML  Final   Culture  Setup Time   Final    GRAM POSITIVE COCCI IN BOTH AEROBIC AND ANAEROBIC BOTTLES CRITICAL RESULT CALLED TO, READ BACK BY AND VERIFIED WITH: MELISSA MACCIA @ [id number] 08/31/15 BY [place]    Culture   Final    METHICILLIN RESISTANT STAPHYLOCOCCUS AUREUS IN BOTH AEROBIC AND ANAEROBIC BOTTLES SUSCEPTIBILITIES TO FOLLOW    Report Status PENDING  Incomplete  Blood Culture ID Panel (Reflexed)     Status: Abnormal   Collection Time: 08/30/15  8:45 PM  Result Value Ref Range Status   Enterococcus species NOT DETECTED NOT DETECTED Final   Vancomycin resistance NOT DETECTED NOT DETECTED Final   Listeria monocytogenes NOT DETECTED NOT DETECTED Final   Staphylococcus species DETECTED (A) NOT DETECTED Final    Comment: CRITICAL RESULT CALLED TO, READ BACK BY AND VERIFIED WITH: Marcelle Overlie @ [id number] 08/31/15 by [place]    Staphylococcus aureus DETECTED (A) NOT DETECTED Final  Comment: CRITICAL RESULT CALLED TO, READ BACK BY AND VERIFIED WITH: Marcelle Overlie @ 1459 08/31/15 by [facility]    Methicillin resistance DETECTED (A) NOT DETECTED Final    Comment: CRITICAL RESULT CALLED TO, READ BACK BY AND VERIFIED WITH: Marcelle Overlie @ 1459 08/31/15 by [place]    Streptococcus species NOT DETECTED NOT DETECTED Final   Streptococcus agalactiae NOT DETECTED NOT DETECTED Final   Streptococcus pneumoniae NOT DETECTED NOT DETECTED Final   Streptococcus pyogenes NOT DETECTED NOT DETECTED Final   Acinetobacter baumannii NOT DETECTED NOT DETECTED Final   Enterobacteriaceae species  NOT DETECTED NOT DETECTED Final   Enterobacter cloacae complex NOT DETECTED NOT DETECTED Final   Escherichia coli NOT DETECTED NOT DETECTED Final   Klebsiella oxytoca NOT DETECTED NOT DETECTED Final   Klebsiella pneumoniae NOT DETECTED NOT DETECTED Final   Proteus species NOT DETECTED NOT DETECTED Final   Serratia marcescens NOT DETECTED NOT DETECTED Final   Carbapenem resistance NOT DETECTED NOT DETECTED Final   Haemophilus influenzae NOT DETECTED NOT DETECTED Final   Neisseria meningitidis NOT DETECTED NOT DETECTED Final   Pseudomonas aeruginosa NOT DETECTED NOT DETECTED Final   Candida albicans NOT DETECTED NOT DETECTED Final   Candida glabrata NOT DETECTED NOT DETECTED Final   Candida krusei NOT DETECTED NOT DETECTED Final   Candida parapsilosis NOT DETECTED NOT DETECTED Final   Candida tropicalis NOT DETECTED NOT DETECTED Final  Culture, blood (Routine X 2) w Reflex to ID Panel     Status: None (Preliminary result)   Collection Time: 08/30/15 10:31 PM  Result Value Ref Range Status   Specimen Description BLOOD RIGHT ARM  Final   Special Requests BOTTLES DRAWN AEROBIC AND ANAEROBIC 5ML  Final   Culture  Setup Time   Final    GRAM POSITIVE COCCI IN BOTH AEROBIC AND ANAEROBIC BOTTLES CRITICAL VALUE NOTED.  VALUE IS CONSISTENT WITH PREVIOUSLY REPORTED AND CALLED VALUE.    Culture   Final    GRAM POSITIVE COCCI IN BOTH AEROBIC AND ANAEROBIC BOTTLES IDENTIFICATION AND SUSCEPTIBILITIES TO FOLLOW    Report Status PENDING  Incomplete  Wound culture     Status: None (Preliminary result)   Collection Time: 08/30/15 10:32 PM  Result Value Ref Range Status   Specimen Description WOUND  Final   Special Requests NONE  Final   Gram Stain FEW GRAM POSITIVE COCCI  Final   Culture   Final    MODERATE GROWTH METHICILLIN RESISTANT STAPHYLOCOCCUS AUREUS   Report Status PENDING  Incomplete   Organism ID, Bacteria METHICILLIN RESISTANT STAPHYLOCOCCUS AUREUS  Final      Susceptibility    Methicillin resistant staphylococcus aureus - MIC*    CIPROFLOXACIN >=8 RESISTANT Resistant     ERYTHROMYCIN >=8 RESISTANT Resistant     GENTAMICIN <=0.5 SENSITIVE Sensitive     OXACILLIN >=4 RESISTANT Resistant     TETRACYCLINE <=1 SENSITIVE Sensitive     VANCOMYCIN 1 SENSITIVE Sensitive     TRIMETH/SULFA <=10 SENSITIVE Sensitive     CLINDAMYCIN <=0.25 SENSITIVE Sensitive     RIFAMPIN <=0.5 SENSITIVE Sensitive     Inducible Clindamycin NEGATIVE Sensitive     * MODERATE GROWTH METHICILLIN RESISTANT STAPHYLOCOCCUS AUREUS  Wound culture     Status: None (Preliminary result)   Collection Time: 08/30/15 10:56 PM  Result Value Ref Range Status   Specimen Description BLOOD  Final   Special Requests NONE  Final   Gram Stain MODERATE RED BLOOD CELLS NO ORGANISMS SEEN   Final  Culture   Final    STAPHYLOCOCCUS AUREUS SUSCEPTIBILITIES TO FOLLOW    Report Status PENDING  Incomplete    Medical History: Past Medical History  Diagnosis Date  . Hyperlipidemia   . Hypertension   . Hypothyroidism   . Type 2 diabetes mellitus ([place])   . Renal agenesis     discovered at age 28  . Recurrent UTI   . Ureteral stricture   . Kidney stones     Medications:  Prescriptions prior to admission  Medication Sig Dispense Refill Last Dose  . amLODipine (NORVASC) 5 MG tablet Take 5 mg by mouth.   09/01/2015 at Unknown time  . calcitRIOL (ROCALTROL) 0.25 MCG capsule Take 0.25 mcg by mouth daily.   09/01/2015 at Unknown time  . cholecalciferol (VITAMIN D) 1000 units tablet Take 1,000 Units by mouth daily.   09/01/2015 at Unknown time  . ferrous sulfate 325 (65 FE) MG tablet Take 325 mg by mouth.   09/01/2015 at Unknown time  . insulin glargine (LANTUS) 100 UNIT/ML injection Inject 0.1 mLs (10 Units total) into the skin at bedtime. 10 mL 11 09/01/2015 at Unknown time  . levothyroxine (SYNTHROID, LEVOTHROID) 125 MCG tablet    09/01/2015 at Unknown time  . lisinopril (PRINIVIL,ZESTRIL) 20 MG tablet Take 20 mg  by mouth.   09/01/2015 at Unknown time  . nystatin (MYCOSTATIN) 100000 UNIT/ML suspension Take 5 mLs (500,000 Units total) by mouth 4 (four) times daily. 60 mL 1 09/01/2015 at Unknown time  . Omega-3 1000 MG CAPS Take by mouth.   09/01/2015 at Unknown time  . polyethylene glycol (MIRALAX / GLYCOLAX) packet Take 17 g by mouth daily as needed for mild constipation. 14 each 0 09/01/2015 at Unknown time  . sodium bicarbonate 650 MG tablet    09/01/2015 at Unknown time  . sulfamethoxazole-trimethoprim (BACTRIM DS,SEPTRA DS) 800-160 MG tablet Take 1 tablet by mouth 2 (two) times daily. 20 tablet 0 09/01/2015 at Unknown time  . clindamycin (CLEOCIN) 300 MG capsule Take 1 capsule (300 mg total) by mouth 4 (four) times daily. (Patient not taking: Reported on 09/01/2015) 30 capsule 0 Completed Course at Unknown time   Assessment: Pharmacy consulted to dose vancomycin in this 82 year old male admitted with sepsis, worsening renal function.  Pt's SrCr was ~ 2  2 weeks ago, today is 4.05.   Pt not on HD at home.   CrCl = 14.1 ml/min ke = 0.01 hr-1 T1/2 = 43.3 hrs Vd = 55.9 L   Goal of Therapy:  Vancomycin trough 15 - 20 mcg/mL  Plan:  Expected duration 7 days with resolution of temperature and/or normalization of WBC   Vancomycin 1 gm IV X 1 given on 3/18 @ 21:00. Vancomycin 1 gm IV Q36H ordered to start 3/19 @ 9:00, ~ 12 hr after 1st dose (stacked dosing). Based on current renal function, this pt will not reach Css until 3/27. Will draw 1st trough on 3/22 @ 8:30 , which will not be at Css.   Alejandro Armstrong D 09/02/2015,12:16 AM

## 2015-09-03 ENCOUNTER — Inpatient Hospital Stay: Payer: Medicare HMO

## 2015-09-03 LAB — BASIC METABOLIC PANEL
Anion gap: 4 — ABNORMAL LOW (ref 5–15)
BUN: 49 mg/dL — AB (ref 6–20)
CHLORIDE: 112 mmol/L — AB (ref 101–111)
CO2: 20 mmol/L — ABNORMAL LOW (ref 22–32)
CREATININE: 3.48 mg/dL — AB (ref 0.61–1.24)
Calcium: 7.7 mg/dL — ABNORMAL LOW (ref 8.9–10.3)
GFR calc Af Amer: 17 mL/min — ABNORMAL LOW (ref >60)
GFR, EST NON AFRICAN AMERICAN: 15 mL/min — AB (ref >60)
GLUCOSE: 83 mg/dL (ref 65–99)
POTASSIUM: 4.7 mmol/L (ref 3.5–5.1)
Sodium: 136 mmol/L (ref 135–145)

## 2015-09-03 LAB — CREATININE, SERUM
CREATININE: 3.53 mg/dL — AB (ref 0.61–1.24)
GFR calc Af Amer: 17 mL/min — ABNORMAL LOW (ref >60)
GFR, EST NON AFRICAN AMERICAN: 15 mL/min — AB (ref >60)

## 2015-09-03 LAB — GLUCOSE, CAPILLARY
Glucose-Capillary: 74 mg/dL (ref 65–99)
Glucose-Capillary: 100 mg/dL — ABNORMAL HIGH (ref 65–99)
Glucose-Capillary: 109 mg/dL — ABNORMAL HIGH (ref 65–99)
Glucose-Capillary: 128 mg/dL — ABNORMAL HIGH (ref 65–99)

## 2015-09-03 LAB — VANCOMYCIN, RANDOM: VANCOMYCIN RM: 11 ug/mL

## 2015-09-03 LAB — HEMOGLOBIN A1C: Hgb A1c MFr Bld: 7.2 % — ABNORMAL HIGH (ref 4.0–6.0)

## 2015-09-03 LAB — EOSINOPHIL, URINE: Eosinophil, Urine: NONE SEEN %

## 2015-09-03 MED ORDER — VANCOMYCIN HCL IN DEXTROSE 1-5 GM/200ML-% IV SOLN
1000.0000 mg | INTRAVENOUS | Status: DC
  Administered 2015-09-03 – 2015-09-06 (×3): 1000 mg via INTRAVENOUS
  Filled 2015-09-03 (×3): qty 200

## 2015-09-03 MED ORDER — ACETAMINOPHEN 325 MG PO TABS
650.0000 mg | ORAL_TABLET | Freq: Four times a day (QID) | ORAL | Status: DC | PRN
  Administered 2015-09-03 – 2015-09-05 (×2): 650 mg via ORAL
  Filled 2015-09-03 (×2): qty 2

## 2015-09-03 NOTE — Progress Notes (Signed)
Pharmacy Antibiotic Note Day 3  Alejandro Armstrong is a 82 y.o. male admitted on 09/01/2015 with bacteremia/sepsis with MRSA, also wound cx+ MRSA  Both from ER visit 3/16.  Pharmacy has been consulted for Vancomycin dosing.  Plan: Goal Trough 15-20 mcg/ml  Patient Received Vancomycin 1 gram on 3/18 at 2106. Vancomcyin 1 gram IV q36hrs ordered with next dose on MAR for 2100 tonight 3/20. (Stacked dose not given?).  3/20: check of random trough( 3/20 at 0843)-  @ 36 hrs after 1 dose= 11 mcg/ml.  Ke=0.017  T1/2= 40.77  Vd= 50.75 TBW 82.2kg AdjBW 72.5 kg Will continue with Vancomycin 1000mg  IV Q36h with dose now. Reevaluate if renal fxn improves. Trough for 3/23 1030.  Height: 5\' 7"  (170.2 cm) Weight: 181 lb 3.2 oz (82.192 kg) IBW/kg (Calculated) : 66.1  Temp (24hrs), Avg:98.1 F (36.7 C), Min:97.9 F (36.6 C), Max:98.5 F (36.9 C)   Recent Labs Lab 08/30/15 2024 09/01/15 2052 09/02/15 0631 09/03/15 0843  WBC 10.3 8.9 7.3  --   CREATININE 3.33* 4.02* 3.71* 3.48*  3.53*  VANCORANDOM  --   --   --  11    Estimated Creatinine Clearance: 16.3 mL/min (by C-G formula based on Cr of 3.53).    No Known Allergies  Antimicrobials this admission: Vancomycin 3/18 >>   >>  Dose adjustments this admission:   Microbiology results: 3/16 BCx: MRSA 3/18 Bcx pending 3/10 Bcx pending  UCx:  Sputum: 3/16 Wound cx(PICC line site): MRSA  MRSA PCR:  Thank you for allowing pharmacy to be a part of this patient's care.  Alejandro Armstrong A 09/03/2015 10:51 AM

## 2015-09-03 NOTE — Progress Notes (Signed)
[facility]  ROUNDING NOTE   Subjective:  Renal function has improved. Creatinine down to 3.5. Platelets stable at 158,000.    Objective:  Vital signs in last 24 hours:  Temp:  [97.7 F (36.5 C)-98.5 F (36.9 C)] 97.7 F (36.5 C) (03/20 1140) Pulse Rate:  [56-64] 64 (03/20 1140) Resp:  [18] 18 (03/20 1140) BP: (130-151)/(63-68) 151/66 mmHg (03/20 1140) SpO2:  [97 %-100 %] 100 % (03/20 1140) Weight:  [82.192 kg (181 lb 3.2 oz)] 82.192 kg (181 lb 3.2 oz) (03/20 0448)  Weight change: 2.359 kg (5 lb 3.2 oz) Filed Weights   09/01/15 2357 09/02/15 0500 09/03/15 0448  Weight: 81.829 kg (180 lb 6.4 oz) 83.5 kg (184 lb 1.4 oz) 82.192 kg (181 lb 3.2 oz)    Intake/Output: I/O last 3 completed shifts: In: 1030 [P.O.:30; IV Piggyback:1000] Out: [id number] [Urine:1375]   Intake/Output this shift:  Total I/O In: -  Out: 450 [Urine:450]  Physical Exam: General: NAD, resting in bed.  Head: Normocephalic, atraumatic. Moist oral mucosal membranes  Eyes: Anicteric  Neck: Supple, trachea midline  Lungs:  Clear to auscultation, normal effort  Heart: Regular rate and rhythm no rubs  Abdomen:  Soft, nontender, BS present  Extremities: 1+ peripheral edema, erythema RUE where picc was  Neurologic: Nonfocal, moving all four extremities  Skin: No lesions       Basic Metabolic Panel:  Recent Labs Lab 08/30/15 2024 09/01/15 2052 09/02/15 0631 09/03/15 0843  NA 136 134* 138 136  K 5.1 5.3* 4.2 4.7  CL 107 105 111 112*  CO2 20* 18* 19* 20*  GLUCOSE 121* 214* 59* 83  BUN 54* 57* 52* 49*  CREATININE 3.33* 4.02* 3.71* 3.48*  3.53*  CALCIUM 8.1* 7.5* 7.5* 7.7*    Liver Function Tests: No results for input(s): AST, ALT, ALKPHOS, BILITOT, PROT, ALBUMIN in the last 168 hours. No results for input(s): LIPASE, AMYLASE in the last 168 hours. No results for input(s): AMMONIA in the last 168 hours.  CBC:  Recent Labs Lab 08/30/15 2024 09/01/15 2052 09/02/15 0631  WBC  10.3 8.9 7.3  NEUTROABS 8.3* 7.7*  --   HGB 9.9* 9.6* 8.6*  HCT 29.2* 27.7* 25.3*  MCV 95.5 95.3 95.6  PLT 120* 165 158    Cardiac Enzymes: No results for input(s): CKTOTAL, CKMB, CKMBINDEX, TROPONINI in the last 168 hours.  BNP: Invalid input(s): POCBNP  CBG:  Recent Labs Lab 09/02/15 1117 09/02/15 1708 09/02/15 2134 09/03/15 0746 09/03/15 1140  GLUCAP 136* 141* 160* 95 100*    Microbiology: Results for orders placed or performed during the hospital encounter of 09/01/15  Culture, blood (Routine X 2) w Reflex to ID Panel     Status: None (Preliminary result)   Collection Time: 09/01/15  8:52 PM  Result Value Ref Range Status   Specimen Description BLOOD LEFT WRIST  Final   Special Requests   Final    BOTTLES DRAWN AEROBIC AND ANAEROBIC 11CCAERO,9CCANA   Culture NO GROWTH 2 DAYS  Final   Report Status PENDING  Incomplete  Culture, blood (Routine X 2) w Reflex to ID Panel     Status: None (Preliminary result)   Collection Time: 09/01/15  8:53 PM  Result Value Ref Range Status   Specimen Description BLOOD RIGHT WRIST  Final   Special Requests BOTTLES DRAWN AEROBIC AND ANAEROBIC [place]  Final   Culture NO GROWTH 2 DAYS  Final   Report Status PENDING  Incomplete    Coagulation Studies:  No results for input(s): LABPROT, INR in the last 72 hours.  Urinalysis: No results for input(s): COLORURINE, LABSPEC, PHURINE, GLUCOSEU, HGBUR, BILIRUBINUR, KETONESUR, PROTEINUR, UROBILINOGEN, NITRITE, LEUKOCYTESUR in the last 72 hours.  Invalid input(s): APPERANCEUR    Imaging: US Renal  09/02/2015  CLINICAL DATA:  Renal failure EXAM: RENAL / URINARY TRACT ULTRASOUND COMPLETE COMPARISON:  CT 08/06/2015 FINDINGS: Right Kidney: Absent. Left Kidney: Length: 16.3 cm. Echogenic, with parenchymal thinning consistent with atrophy. Lower pole cortical calcifications are present, similar to the appearances on CT. No conclusive collecting system calculi. No hydronephrosis per  Bladder: Appears normal for degree of bladder distention. Left ureteral jet observed. IMPRESSION: Absent right kidney. Left kidney appears atrophic with echogenic parenchyma consistent with medical renal disease. No hydronephrosis. Left ureteral jet documented at the urinary bladder. Electronically Signed   By: Andreas Newport M.D.   On: 09/02/2015 01:54   Dg Chest Port 1 View  09/01/2015  CLINICAL DATA:  Recent removal of the PICC line after developing erythema at the site. Now with developing weakness. EXAM: PORTABLE CHEST 1 VIEW COMPARISON:  08/03/2015 FINDINGS: Mildly prominent right hilar contours are likely vascular, accentuated due to the AP portable acquisition. The lungs are clear. Pulmonary vasculature is normal. There is no large effusion. Heart size is upper normal, unchanged. IMPRESSION: No acute cardiopulmonary findings. Electronically Signed   By: Andreas Newport M.D.   On: 09/01/2015 21:13     Medications:   . sodium chloride 50 mL/hr at 09/03/15 1245   . amLODipine  5 mg Oral Daily  . calcitRIOL  0.25 mcg Oral Daily  . cholecalciferol  1,000 Units Oral Daily  . ferrous sulfate  325 mg Oral BID WC  . heparin  5,000 Units Subcutaneous 3 times per day  . insulin aspart  0-15 Units Subcutaneous TID WC  . insulin glargine  10 Units Subcutaneous Daily  . levothyroxine  125 mcg Oral QAC breakfast  . sodium bicarbonate  650 mg Oral Daily  . vancomycin  1,000 mg Intravenous Q36H   acetaminophen, polyethylene glycol  Assessment/ Plan:  82 y.o. 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   1. Chronic kidney disease stage IV/congenitally absent right kidney/proteinuria/ARF due to MRSA sepsis 2.  Hyperkalemia 3. Diabetes with CKD 4. MRSA sepsis due to infected PICC. 5.  ITP treated with prednisone and rituximab.  Plan: Renal function improving.  Creatinine currently down to 3.53. Continue gentle IV fluid hydration for now.  Potassium also normal at 4.7.  Treatment of MRSA sepsis per Dr. Ola Spurr. Patient will need vancomycin for at least 2 weeks If another PICC is to be placed we recommendthat this be placed in an internal jugular vein as this pt has some risk to proceed to ESRD.  As before treatment of ITP per Dr. Grayland Ormond.  We will continue to monitor the patient's progress.    LOS: 2 Feliciana Narayan 3/20/20172:17 PM

## 2015-09-03 NOTE — Clinical Documentation Improvement (Signed)
Internal Medicine  Please clarify if the following diagnosis, Sepsis/Bacteremia  was:   Present at the time of admission (POA)  NOT present at the time of admission and it developed during the inpatient stay  Unable to clinically determine whether the condition was present on admission.  Unknown   Supporting Information: No growth from blood cultures x 2    Please exercise your independent, professional judgment when responding. A specific answer is not anticipated or expected.   Thank You,  [place] [phone]

## 2015-09-03 NOTE — Progress Notes (Signed)
[place] [facility] [place] NAME: Alejandro Armstrong    MR#:  [id number]  DATE OF BIRTH:  [birth year]  SUBJECTIVE: no fever.rpt blood cultures are pending.over al feels weak,.PIcc line site not tender.  CHIEF COMPLAINT:   Chief Complaint  Patient presents with  . Abnormal Lab   - Patient admitted with MRSA bacteremia, recent PICC line that was infected and removed in the emergency room 2 days ago. -No further fevers now.  -Also noted to have acute renal failure.   REVIEW OF SYSTEMS:  Review of Systems  Constitutional: Positive for malaise/fatigue. Negative for fever and chills.  HENT: Negative for ear discharge, ear pain and nosebleeds.   Eyes: Negative for blurred vision and double vision.  Respiratory: Negative for cough, shortness of breath and wheezing.   Cardiovascular: Positive for leg swelling. Negative for chest pain and palpitations.  Gastrointestinal: Negative for nausea, vomiting, abdominal pain, diarrhea and constipation.  Genitourinary: Negative for dysuria and urgency.  Musculoskeletal: Negative for myalgias.  Neurological: Negative for dizziness, tremors, speech change, focal weakness, seizures and headaches.  Psychiatric/Behavioral: Negative for depression.    DRUG ALLERGIES:  No Known Allergies  VITALS:  Blood pressure 151/66, pulse 64, temperature 97.7 F (36.5 C), temperature source Oral, resp. rate 18, height 5\' 7"  (1.702 m), weight 82.192 kg (181 lb 3.2 oz), SpO2 100 %.  PHYSICAL EXAMINATION:  Physical Exam  GENERAL:  82 y.o.-year-old patient lying in the bed with no acute distress.chroncially ill appearing  EYES: Pupils equal, round, reactive to light and accommodation. No scleral icterus. Extraocular muscles intact.  HEENT: Head atraumatic, normocephalic. Oropharynx and nasopharynx clear.  NECK:  Supple, no jugular venous distention. No thyroid enlargement, no tenderness.  LUNGS: Normal breath sounds  bilaterally, no wheezing, rales,rhonchi or crepitation. No use of accessory muscles of respiration. Decreased bibasilar breath sounds noted CARDIOVASCULAR: S1, S2 normal. No rubs, or gallops. 3/6 systolic murmur is present ABDOMEN: Soft, nontender, nondistended. Bowel sounds present. No organomegaly or mass.  EXTREMITIES: No cyanosis, or clubbing. 2+ edema noted in the extremities NEUROLOGIC: Cranial nerves II through XII are intact. Muscle strength 5/5 in all extremities. Sensation intact. Gait not checked.  PSYCHIATRIC: The patient is alert and oriented x 3.  SKIN: No obvious rash, lesion, or ulcer. Dry and flaky skin noted   LABORATORY PANEL:   CBC  Recent Labs Lab 09/02/15 0631  WBC 7.3  HGB 8.6*  HCT 25.3*  PLT 158   ------------------------------------------------------------------------------------------------------------------  Chemistries   Recent Labs Lab 09/03/15 0843  NA 136  K 4.7  CL 112*  CO2 20*  GLUCOSE 83  BUN 49*  CREATININE 3.48*  3.53*  CALCIUM 7.7*   ------------------------------------------------------------------------------------------------------------------  Cardiac Enzymes No results for input(s): TROPONINI in the last 168 hours. ------------------------------------------------------------------------------------------------------------------  RADIOLOGY:  US Renal  09/02/2015  CLINICAL DATA:  Renal failure EXAM: RENAL / URINARY TRACT ULTRASOUND COMPLETE COMPARISON:  CT 08/06/2015 FINDINGS: Right Kidney: Absent. Left Kidney: Length: 16.3 cm. Echogenic, with parenchymal thinning consistent with atrophy. Lower pole cortical calcifications are present, similar to the appearances on CT. No conclusive collecting system calculi. No hydronephrosis per Bladder: Appears normal for degree of bladder distention. Left ureteral jet observed. IMPRESSION: Absent right kidney. Left kidney appears atrophic with echogenic parenchyma consistent with medical  renal disease. No hydronephrosis. Left ureteral jet documented at the urinary bladder. Electronically Signed   By: Andreas Newport M.D.   On: 09/02/2015 01:54   Dg Chest [facility]  09/01/2015  CLINICAL DATA:  Recent removal of the PICC line after developing erythema at the site. Now with developing weakness. EXAM: PORTABLE CHEST 1 VIEW COMPARISON:  08/03/2015 FINDINGS: Mildly prominent right hilar contours are likely vascular, accentuated due to the AP portable acquisition. The lungs are clear. Pulmonary vasculature is normal. There is no large effusion. Heart size is upper normal, unchanged. IMPRESSION: No acute cardiopulmonary findings. Electronically Signed   By: Andreas Newport M.D.   On: 09/01/2015 21:13    EKG:   Orders placed or performed during the hospital encounter of 09/01/15  . ED EKG  . ED EKG  . EKG 12-Lead  . EKG 12-Lead  . EKG test  . EKG test    ASSESSMENT AND PLAN:   82 year old male with past medical history significant for ITP, hypertension, hyperlipidemia, CKD baseline creatinine of 2.5 was brought in for MRSA bacteremia from an infected PICC line for his rituxan treatments  #1 MRSA bacteremia-secondary to infected PICC line. PICC line was taken out 2 days ago in the emergency room. -Continue IV vancomycin. Monitor renal function.-ID consulted. Sepsis present on admission. -Repeat blood cultures today to ensure clearing of MRSA. -Echocardiogram  Did not show any  Vegetations.EF more than 55%  #2 acute on chronic renal insufficiency-baseline creatinine around 2.2. -Creatinine on admission was 4, likely ATN from sepsis.has h/o CKD stage 4; -Appreciate nephrology consult. No acute indication for hemodialysis. Gentle hydration  -Creatinine is improving. Avoid nephrotoxins - #3 hypokalemia-secondary to acute renal failure. Improved after treatment yesterday.  #4 elevated TSH- free t4 within in normal limits. Likely from acute infection. Monitor -Continue  Synthroid  #5 anemia of chronic disease-stable. Continue to monitor -Continue iron supplements  #6 DVT prophylaxis-on subcutaneous heparin  PT consult. Lives at home with his wife. Uses a cane to ambulate.  7.h/o ITP>;ges Rituximab infusions.platelets 158  All the records are reviewed and case discussed with Care Management/Social Workerr. Management plans discussed with the patient, family and they are in agreement.  CODE STATUS: Full Code  TOTAL TIME TAKING CARE OF THIS PATIENT: 37 minutes.   POSSIBLE D/C IN 2 DAYS, DEPENDING ON CLINICAL CONDITION.   Epifanio Lesches M.D on 09/03/2015 at 1:29 PM  Between 7am to 6pm - Pager - [phone]  After 6pm go to [email or web address] - password [email or web address]  Office  [phone]  CC: Primary care physician; Casilda Carls, MD

## 2015-09-03 NOTE — Care Management (Signed)
Patient readmitted from home.   He had presented to ED two days prior to admission and was called back due to positive blood cultures. Patient  had a PICC line in place for Rituximab infusions for ITP.  REceived cycle 4 of 4 on 3/18 through [facility].   Wife says there is a nurse that comes to the house from [facility] but can not give specifics of what  the nurse does.  She presents a card- [employer].  CM has left a message with agency's answering service  ( [phone]).  Patient does not receive any infusions in the home.  Wife says she and "that nurse" flushes the PICC.  The nurse has come 3 or four times.   The PICC line has been removed.  Patient's wife says patient has a long term care policy and asks what it covers.  Informed her CM would provider her with a list of inhome care provider agencies for her to call and assess patient to determine if he qualifies for services.

## 2015-09-03 NOTE — Consult Note (Signed)
[facility] Infectious Disease     Reason for Consult:Staph Aureus bacteremia    Referring Physician: Vianne Bulls Date of Admission:  09/01/2015   Principal Problem:   MRSA bacteremia secondary to PICC line infection Active Problems:   ITP (idiopathic thrombocytopenic purpura)   Acute on chronic renal insufficiency (HCC)   Hyperkalemia   Type 2 diabetes mellitus (HCC)   HPI: Alejandro Armstrong is a 82 y.o. male with ITP, CKD who had a chronic picc in place and was seen in ED 3/17 because of a concern regarding an infected PICC line. The line was noted to be grossly infected at insertion and it was removed however bcx came back + for MRSA so pt was called to be admitted. Wound cx also +. He has been afebrile. Started on vanco 3/16 x 1 dose.  [place] 3/18 NGTD Only other complaint is R shoulder pain but better since picc removed and on abx.   Past Medical History  Diagnosis Date  . Hyperlipidemia   . Hypertension   . Hypothyroidism   . Type 2 diabetes mellitus ([place])   . Renal agenesis     discovered at age 83  . Recurrent UTI   . Ureteral stricture   . Kidney stones    Past Surgical History  Procedure Laterality Date  . Kidney stone surgery     Social History  Substance Use Topics  . Smoking status: Never Smoker   . Smokeless tobacco: Never Used  . Alcohol Use: No   Family History  Problem Relation Age of Onset  . Cervical cancer Sister     Allergies: No Known Allergies  Current antibiotics: Antibiotics Given (last 72 hours)    Date/Time Action Medication Dose Rate   09/03/15 1245 Given   vancomycin (VANCOCIN) IVPB 1000 mg/200 mL premix 1,000 mg 200 mL/hr      MEDICATIONS: . amLODipine  5 mg Oral Daily  . calcitRIOL  0.25 mcg Oral Daily  . cholecalciferol  1,000 Units Oral Daily  . ferrous sulfate  325 mg Oral BID WC  . heparin  5,000 Units Subcutaneous 3 times per day  . insulin aspart  0-15 Units Subcutaneous TID WC  . insulin glargine  10 Units Subcutaneous  Daily  . levothyroxine  125 mcg Oral QAC breakfast  . sodium bicarbonate  650 mg Oral Daily  . vancomycin  1,000 mg Intravenous Q36H    Review of Systems - 11 systems reviewed and negative per HPI   OBJECTIVE: Temp:  [97.7 F (36.5 C)-98.5 F (36.9 C)] 97.7 F (36.5 C) (03/20 1140) Pulse Rate:  [56-64] 64 (03/20 1140) Resp:  [18] 18 (03/20 1140) BP: (130-151)/(63-68) 151/66 mmHg (03/20 1140) SpO2:  [97 %-100 %] 100 % (03/20 1140) Weight:  [82.192 kg (181 lb 3.2 oz)] 82.192 kg (181 lb 3.2 oz) (03/20 0448) Physical Exam  Constitutional:  oriented to person, place, and time. appears well-developed and well-nourished. No distress.  HENT: [place]/AT, PERRLA, no scleral icterus Mouth/Throat: Oropharynx is clear and moist. No oropharyngeal exudate.  Cardiovascular: Normal rate, regular rhythm - distant HS  Pulmonary/Chest: Effort normal and breath sounds normal. No respiratory distress.  has no wheezes.  Neck  supple, no nuchal rigidity Abdominal: Soft. Bowel sounds are normal.  exhibits no distension. There is no tenderness.  Lymphadenopathy: no cervical adenopathy. No axillary adenopathy EXT RLE - 2+ edema Neurological: alert and oriented to person, place, and time.  Skin: Skin is warm and dry, flaky skin bil le Psychiatric: a  normal mood and affect.  behavior is normal.   LABS: Results for orders placed or performed during the hospital encounter of 09/01/15 (from the past 48 hour(s))  CBC with Differential     Status: Abnormal   Collection Time: 09/01/15  8:52 PM  Result Value Ref Range   WBC 8.9 3.8 - 10.6 K/uL   RBC 2.90 (L) 4.40 - 5.90 MIL/uL   Hemoglobin 9.6 (L) 13.0 - 18.0 g/dL   HCT 27.7 (L) 40.0 - 52.0 %   MCV 95.3 80.0 - 100.0 fL   MCH 33.0 26.0 - 34.0 pg   MCHC 34.6 32.0 - 36.0 g/dL   RDW 14.3 11.5 - 14.5 %   Platelets 165 150 - 440 K/uL   Neutrophils Relative % 87 %   Neutro Abs 7.7 (H) 1.4 - 6.5 K/uL   Lymphocytes Relative 8 %   Lymphs Abs 0.7 (L) 1.0 - 3.6 K/uL    Monocytes Relative 5 %   Monocytes Absolute 0.5 0.2 - 1.0 K/uL   Eosinophils Relative 0 %   Eosinophils Absolute 0.0 0 - 0.7 K/uL   Basophils Relative 0 %   Basophils Absolute 0.0 0 - 0.1 K/uL  Basic metabolic panel     Status: Abnormal   Collection Time: 09/01/15  8:52 PM  Result Value Ref Range   Sodium 134 (L) 135 - 145 mmol/L   Potassium 5.3 (H) 3.5 - 5.1 mmol/L   Chloride 105 101 - 111 mmol/L   CO2 18 (L) 22 - 32 mmol/L   Glucose, Bld 214 (H) 65 - 99 mg/dL   BUN 57 (H) 6 - 20 mg/dL   Creatinine, Ser 4.02 (H) 0.61 - 1.24 mg/dL   Calcium 7.5 (L) 8.9 - 10.3 mg/dL   GFR calc non Af Amer 13 (L) >60 mL/min   GFR calc Af Amer 15 (L) >60 mL/min    Comment: (NOTE) The eGFR has been calculated using the CKD EPI equation. This calculation has not been validated in all clinical situations. eGFR's persistently <60 mL/min signify possible Chronic Kidney Disease.    Anion gap 11 5 - 15  Culture, blood (Routine X 2) w Reflex to ID Panel     Status: None (Preliminary result)   Collection Time: 09/01/15  8:52 PM  Result Value Ref Range   Specimen Description BLOOD LEFT WRIST    Special Requests      BOTTLES DRAWN AEROBIC AND ANAEROBIC 11CCAERO,9CCANA   Culture NO GROWTH 2 DAYS    Report Status PENDING   Culture, blood (Routine X 2) w Reflex to ID Panel     Status: None (Preliminary result)   Collection Time: 09/01/15  8:53 PM  Result Value Ref Range   Specimen Description BLOOD RIGHT WRIST    Special Requests BOTTLES DRAWN AEROBIC AND ANAEROBIC [place]    Culture NO GROWTH 2 DAYS    Report Status PENDING   Glucose, capillary     Status: Abnormal   Collection Time: 09/02/15 12:13 AM  Result Value Ref Range   Glucose-Capillary 135 (H) 65 - 99 mg/dL  Sodium, urine, random     Status: None   Collection Time: 09/02/15  1:58 AM  Result Value Ref Range   Sodium, Ur 100 mmol/L  Creatinine, urine, random     Status: None   Collection Time: 09/02/15  1:58 AM  Result Value Ref Range    Creatinine, Urine 40 mg/dL  TSH     Status: Abnormal   Collection Time:  09/02/15  6:31 AM  Result Value Ref Range   TSH 14.910 (H) 0.350 - 4.500 uIU/mL  Basic metabolic panel     Status: Abnormal   Collection Time: 09/02/15  6:31 AM  Result Value Ref Range   Sodium 138 135 - 145 mmol/L   Potassium 4.2 3.5 - 5.1 mmol/L   Chloride 111 101 - 111 mmol/L   CO2 19 (L) 22 - 32 mmol/L   Glucose, Bld 59 (L) 65 - 99 mg/dL   BUN 52 (H) 6 - 20 mg/dL   Creatinine, Ser 3.71 (H) 0.61 - 1.24 mg/dL   Calcium 7.5 (L) 8.9 - 10.3 mg/dL   GFR calc non Af Amer 14 (L) >60 mL/min   GFR calc Af Amer 16 (L) >60 mL/min    Comment: (NOTE) The eGFR has been calculated using the CKD EPI equation. This calculation has not been validated in all clinical situations. eGFR's persistently <60 mL/min signify possible Chronic Kidney Disease.    Anion gap 8 5 - 15  CBC     Status: Abnormal   Collection Time: 09/02/15  6:31 AM  Result Value Ref Range   WBC 7.3 3.8 - 10.6 K/uL   RBC 2.65 (L) 4.40 - 5.90 MIL/uL   Hemoglobin 8.6 (L) 13.0 - 18.0 g/dL   HCT 25.3 (L) 40.0 - 52.0 %   MCV 95.6 80.0 - 100.0 fL   MCH 32.5 26.0 - 34.0 pg   MCHC 34.1 32.0 - 36.0 g/dL   RDW 14.1 11.5 - 14.5 %   Platelets 158 150 - 440 K/uL  Hemoglobin A1c     Status: Abnormal   Collection Time: 09/02/15  6:31 AM  Result Value Ref Range   Hgb A1c MFr Bld 7.2 (H) 4.0 - 6.0 %  T4, free     Status: None   Collection Time: 09/02/15  6:31 AM  Result Value Ref Range   Free T4 0.73 0.61 - 1.12 ng/dL  Glucose, capillary     Status: None   Collection Time: 09/02/15  7:55 AM  Result Value Ref Range   Glucose-Capillary 76 65 - 99 mg/dL   Comment 1 Notify RN   Glucose, capillary     Status: Abnormal   Collection Time: 09/02/15 11:17 AM  Result Value Ref Range   Glucose-Capillary 136 (H) 65 - 99 mg/dL   Comment 1 Notify RN   Glucose, capillary     Status: Abnormal   Collection Time: 09/02/15  5:08 PM  Result Value Ref Range    Glucose-Capillary 141 (H) 65 - 99 mg/dL   Comment 1 Notify RN   Glucose, capillary     Status: Abnormal   Collection Time: 09/02/15  9:34 PM  Result Value Ref Range   Glucose-Capillary 160 (H) 65 - 99 mg/dL  Glucose, capillary     Status: None   Collection Time: 09/03/15  7:46 AM  Result Value Ref Range   Glucose-Capillary 74 65 - 99 mg/dL   Comment 1 Notify RN   Creatinine, serum     Status: Abnormal   Collection Time: 09/03/15  8:43 AM  Result Value Ref Range   Creatinine, Ser 3.53 (H) 0.61 - 1.24 mg/dL   GFR calc non Af Amer 15 (L) >60 mL/min   GFR calc Af Amer 17 (L) >60 mL/min    Comment: (NOTE) The eGFR has been calculated using the CKD EPI equation. This calculation has not been validated in all clinical situations. eGFR's persistently <60 mL/min  signify possible Chronic Kidney Disease.   Vancomycin, random     Status: None   Collection Time: 09/03/15  8:43 AM  Result Value Ref Range   Vancomycin Rm 11 ug/mL    Comment:        Random Vancomycin therapeutic range is dependent on dosage and time of specimen collection. A peak range is 20.0-40.0 ug/mL A trough range is 5.0-15.0 ug/mL          Basic metabolic panel     Status: Abnormal   Collection Time: 09/03/15  8:43 AM  Result Value Ref Range   Sodium 136 135 - 145 mmol/L   Potassium 4.7 3.5 - 5.1 mmol/L   Chloride 112 (H) 101 - 111 mmol/L   CO2 20 (L) 22 - 32 mmol/L   Glucose, Bld 83 65 - 99 mg/dL   BUN 49 (H) 6 - 20 mg/dL   Creatinine, Ser 3.48 (H) 0.61 - 1.24 mg/dL   Calcium 7.7 (L) 8.9 - 10.3 mg/dL   GFR calc non Af Amer 15 (L) >60 mL/min   GFR calc Af Amer 17 (L) >60 mL/min    Comment: (NOTE) The eGFR has been calculated using the CKD EPI equation. This calculation has not been validated in all clinical situations. eGFR's persistently <60 mL/min signify possible Chronic Kidney Disease.    Anion gap 4 (L) 5 - 15  Glucose, capillary     Status: Abnormal   Collection Time: 09/03/15 11:40 AM  Result  Value Ref Range   Glucose-Capillary 100 (H) 65 - 99 mg/dL   Comment 1 Notify RN    No components found for: ESR, C REACTIVE PROTEIN MICRO: Recent Results (from the past 720 hour(s))  Urine culture     Status: None   Collection Time: 08/05/15  9:14 PM  Result Value Ref Range Status   Specimen Description URINE, RANDOM  Final   Special Requests NONE  Final   Culture 1,000 COLONIES/mL INSIGNIFICANT GROWTH  Final   Report Status 08/07/2015 FINAL  Final  C difficile quick scan w PCR reflex     Status: None   Collection Time: 08/06/15  2:35 AM  Result Value Ref Range Status   C Diff antigen NEGATIVE NEGATIVE Final   C Diff toxin NEGATIVE NEGATIVE Final   C Diff interpretation Negative for C. difficile  Final  Blood culture (routine x 2)     Status: None   Collection Time: 08/30/15  8:20 PM  Result Value Ref Range Status   Specimen Description BLOOD LEFT HAND  Final   Special Requests BOTTLES DRAWN AEROBIC AND ANAEROBIC 5ML  Final   Culture  Setup Time   Final    GRAM POSITIVE COCCI IN BOTH AEROBIC AND ANAEROBIC BOTTLES PREVIOUSLY CALLED AT 0263 08/31/15 BY [facility].    Culture   Final    METHICILLIN RESISTANT STAPHYLOCOCCUS AUREUS IN BOTH AEROBIC AND ANAEROBIC BOTTLES    Report Status 09/02/2015 FINAL  Final   Organism ID, Bacteria METHICILLIN RESISTANT STAPHYLOCOCCUS AUREUS  Final      Susceptibility   Methicillin resistant staphylococcus aureus - MIC*    CIPROFLOXACIN >=8 RESISTANT Resistant     ERYTHROMYCIN >=8 RESISTANT Resistant     GENTAMICIN <=0.5 SENSITIVE Sensitive     OXACILLIN >=4 RESISTANT Resistant     TETRACYCLINE <=1 SENSITIVE Sensitive     VANCOMYCIN <=0.5 SENSITIVE Sensitive     TRIMETH/SULFA <=10 SENSITIVE Sensitive     CLINDAMYCIN <=0.25 SENSITIVE Sensitive     RIFAMPIN <=  0.5 SENSITIVE Sensitive     Inducible Clindamycin NEGATIVE Sensitive     * METHICILLIN RESISTANT STAPHYLOCOCCUS AUREUS  Blood culture (routine x 2)     Status: None   Collection Time:  08/30/15  8:45 PM  Result Value Ref Range Status   Specimen Description BLOOD RIGHT HAND  Final   Special Requests BOTTLES DRAWN AEROBIC AND ANAEROBIC 5ML  Final   Culture  Setup Time   Final    GRAM POSITIVE COCCI IN BOTH AEROBIC AND ANAEROBIC BOTTLES CRITICAL RESULT CALLED TO, READ BACK BY AND VERIFIED WITH: MELISSA MACCIA @ 1779 08/31/15 BY [place]    Culture   Final    METHICILLIN RESISTANT STAPHYLOCOCCUS AUREUS IN BOTH AEROBIC AND ANAEROBIC BOTTLES    Report Status 09/02/2015 FINAL  Final   Organism ID, Bacteria METHICILLIN RESISTANT STAPHYLOCOCCUS AUREUS  Final      Susceptibility   Methicillin resistant staphylococcus aureus - MIC*    CIPROFLOXACIN >=8 RESISTANT Resistant     ERYTHROMYCIN >=8 RESISTANT Resistant     GENTAMICIN <=0.5 SENSITIVE Sensitive     OXACILLIN >=4 RESISTANT Resistant     TETRACYCLINE <=1 SENSITIVE Sensitive     VANCOMYCIN 1 SENSITIVE Sensitive     TRIMETH/SULFA <=10 SENSITIVE Sensitive     CLINDAMYCIN <=0.25 SENSITIVE Sensitive     RIFAMPIN <=0.5 SENSITIVE Sensitive     Inducible Clindamycin NEGATIVE Sensitive     * METHICILLIN RESISTANT STAPHYLOCOCCUS AUREUS  Blood Culture ID Panel (Reflexed)     Status: Abnormal   Collection Time: 08/30/15  8:45 PM  Result Value Ref Range Status   Enterococcus species NOT DETECTED NOT DETECTED Final   Vancomycin resistance NOT DETECTED NOT DETECTED Final   Listeria monocytogenes NOT DETECTED NOT DETECTED Final   Staphylococcus species DETECTED (A) NOT DETECTED Final    Comment: CRITICAL RESULT CALLED TO, READ BACK BY AND VERIFIED WITH: Marcelle Overlie @ [id number] 08/31/15 by [place]    Staphylococcus aureus DETECTED (A) NOT DETECTED Final    Comment: CRITICAL RESULT CALLED TO, READ BACK BY AND VERIFIED WITH: Marcelle Overlie @ 0092 08/31/15 by [facility]    Methicillin resistance DETECTED (A) NOT DETECTED Final    Comment: CRITICAL RESULT CALLED TO, READ BACK BY AND VERIFIED WITH: Marcelle Overlie @ 3300 08/31/15 by [place]     Streptococcus species NOT DETECTED NOT DETECTED Final   Streptococcus agalactiae NOT DETECTED NOT DETECTED Final   Streptococcus pneumoniae NOT DETECTED NOT DETECTED Final   Streptococcus pyogenes NOT DETECTED NOT DETECTED Final   Acinetobacter baumannii NOT DETECTED NOT DETECTED Final   Enterobacteriaceae species NOT DETECTED NOT DETECTED Final   Enterobacter cloacae complex NOT DETECTED NOT DETECTED Final   Escherichia coli NOT DETECTED NOT DETECTED Final   Klebsiella oxytoca NOT DETECTED NOT DETECTED Final   Klebsiella pneumoniae NOT DETECTED NOT DETECTED Final   Proteus species NOT DETECTED NOT DETECTED Final   Serratia marcescens NOT DETECTED NOT DETECTED Final   Carbapenem resistance NOT DETECTED NOT DETECTED Final   Haemophilus influenzae NOT DETECTED NOT DETECTED Final   Neisseria meningitidis NOT DETECTED NOT DETECTED Final   Pseudomonas aeruginosa NOT DETECTED NOT DETECTED Final   Candida albicans NOT DETECTED NOT DETECTED Final   Candida glabrata NOT DETECTED NOT DETECTED Final   Candida krusei NOT DETECTED NOT DETECTED Final   Candida parapsilosis NOT DETECTED NOT DETECTED Final   Candida tropicalis NOT DETECTED NOT DETECTED Final  Culture, blood (Routine X 2) w Reflex to ID Panel  Status: None   Collection Time: 08/30/15 10:31 PM  Result Value Ref Range Status   Specimen Description BLOOD RIGHT ARM  Final   Special Requests BOTTLES DRAWN AEROBIC AND ANAEROBIC 5ML  Final   Culture  Setup Time   Final    GRAM POSITIVE COCCI IN BOTH AEROBIC AND ANAEROBIC BOTTLES CRITICAL VALUE NOTED.  VALUE IS CONSISTENT WITH PREVIOUSLY REPORTED AND CALLED VALUE.    Culture   Final    METHICILLIN RESISTANT STAPHYLOCOCCUS AUREUS IN BOTH AEROBIC AND ANAEROBIC BOTTLES    Report Status 09/02/2015 FINAL  Final   Organism ID, Bacteria METHICILLIN RESISTANT STAPHYLOCOCCUS AUREUS  Final      Susceptibility   Methicillin resistant staphylococcus aureus - MIC*    CIPROFLOXACIN >=8 RESISTANT  Resistant     ERYTHROMYCIN >=8 RESISTANT Resistant     GENTAMICIN <=0.5 SENSITIVE Sensitive     OXACILLIN >=4 RESISTANT Resistant     TETRACYCLINE <=1 SENSITIVE Sensitive     VANCOMYCIN <=0.5 SENSITIVE Sensitive     TRIMETH/SULFA <=10 SENSITIVE Sensitive     CLINDAMYCIN <=0.25 SENSITIVE Sensitive     RIFAMPIN <=0.5 SENSITIVE Sensitive     Inducible Clindamycin NEGATIVE Sensitive     * METHICILLIN RESISTANT STAPHYLOCOCCUS AUREUS  Wound culture     Status: None   Collection Time: 08/30/15 10:32 PM  Result Value Ref Range Status   Specimen Description WOUND  Final   Special Requests NONE  Final   Gram Stain FEW GRAM POSITIVE COCCI  Final   Culture   Final    MODERATE GROWTH METHICILLIN RESISTANT STAPHYLOCOCCUS AUREUS   Report Status 09/02/2015 FINAL  Final   Organism ID, Bacteria METHICILLIN RESISTANT STAPHYLOCOCCUS AUREUS  Final      Susceptibility   Methicillin resistant staphylococcus aureus - MIC*    CIPROFLOXACIN >=8 RESISTANT Resistant     ERYTHROMYCIN >=8 RESISTANT Resistant     GENTAMICIN <=0.5 SENSITIVE Sensitive     OXACILLIN >=4 RESISTANT Resistant     TETRACYCLINE <=1 SENSITIVE Sensitive     VANCOMYCIN 1 SENSITIVE Sensitive     TRIMETH/SULFA <=10 SENSITIVE Sensitive     CLINDAMYCIN <=0.25 SENSITIVE Sensitive     RIFAMPIN <=0.5 SENSITIVE Sensitive     Inducible Clindamycin NEGATIVE Sensitive     * MODERATE GROWTH METHICILLIN RESISTANT STAPHYLOCOCCUS AUREUS  Wound culture     Status: None   Collection Time: 08/30/15 10:56 PM  Result Value Ref Range Status   Specimen Description BLOOD  Final   Special Requests NONE  Final   Gram Stain MODERATE RED BLOOD CELLS NO ORGANISMS SEEN   Final   Culture METHICILLIN RESISTANT STAPHYLOCOCCUS AUREUS  Final   Report Status 09/02/2015 FINAL  Final   Organism ID, Bacteria METHICILLIN RESISTANT STAPHYLOCOCCUS AUREUS  Final      Susceptibility   Methicillin resistant staphylococcus aureus - MIC*    CIPROFLOXACIN >=8 RESISTANT  Resistant     ERYTHROMYCIN >=8 RESISTANT Resistant     GENTAMICIN <=0.5 SENSITIVE Sensitive     OXACILLIN >=4 RESISTANT Resistant     TETRACYCLINE <=1 SENSITIVE Sensitive     VANCOMYCIN 1 SENSITIVE Sensitive     TRIMETH/SULFA <=10 SENSITIVE Sensitive     CLINDAMYCIN <=0.25 SENSITIVE Sensitive     RIFAMPIN <=0.5 SENSITIVE Sensitive     Inducible Clindamycin NEGATIVE Sensitive     * METHICILLIN RESISTANT STAPHYLOCOCCUS AUREUS  Culture, blood (Routine X 2) w Reflex to ID Panel     Status: None (Preliminary result)   Collection  Time: 09/01/15  8:52 PM  Result Value Ref Range Status   Specimen Description BLOOD LEFT WRIST  Final   Special Requests   Final    BOTTLES DRAWN AEROBIC AND ANAEROBIC 11CCAERO,9CCANA   Culture NO GROWTH 2 DAYS  Final   Report Status PENDING  Incomplete  Culture, blood (Routine X 2) w Reflex to ID Panel     Status: None (Preliminary result)   Collection Time: 09/01/15  8:53 PM  Result Value Ref Range Status   Specimen Description BLOOD RIGHT WRIST  Final   Special Requests BOTTLES DRAWN AEROBIC AND ANAEROBIC [place]  Final   Culture NO GROWTH 2 DAYS  Final   Report Status PENDING  Incomplete    IMAGING: Ct Abdomen Pelvis Wo Contrast  08/06/2015  CLINICAL DATA:  Acute onset of vomiting and tachycardia. Idiopathic thrombocytopenic purpura. Initial encounter. EXAM: CT CHEST, ABDOMEN AND PELVIS WITHOUT CONTRAST TECHNIQUE: Multidetector CT imaging of the chest, abdomen and pelvis was performed following the standard protocol without IV contrast. COMPARISON:  Chest radiograph performed 08/03/2015 FINDINGS: CT CHEST Mild atelectasis or scarring is noted at the left lung base. The lungs are otherwise clear. There is no evidence of focal consolidation, pleural effusion or pneumothorax. No masses are identified. Diffuse coronary artery calcifications are seen. A small to moderate hiatal hernia is noted. No mediastinal lymphadenopathy is seen. No pericardial effusion is  identified. The great vessels are grossly unremarkable. A right-sided PICC is noted ending about the distal SVC. The thyroid gland is diminutive and grossly unremarkable. No axillary lymphadenopathy is seen. No acute osseous abnormalities are identified. CT ABDOMEN AND PELVIS The liver and spleen are unremarkable in appearance. The gallbladder is within normal limits. The pancreas and adrenal glands are unremarkable. Left-sided renal atrophy is noted, with likely left-sided renal cysts and scattered parenchymal calcifications at the lower pole of the left kidney. The right kidney is markedly atrophic. There is no evidence of hydronephrosis. No renal or ureteral stones are identified. No free fluid is identified. The small bowel is unremarkable in appearance. The stomach is otherwise within normal limits. No acute vascular abnormalities are seen. Scattered calcification is noted along the abdominal aorta. The appendix is not definitely characterized; there is no evidence of appendicitis. The colon is unremarkable in appearance. The bladder is mildly distended and grossly unremarkable. The prostate remains normal in size. No inguinal lymphadenopathy is seen. No acute osseous abnormalities are identified. There is chronic loss of height involving vertebral body L1. IMPRESSION: 1. No acute abnormality seen within the abdomen or pelvis. 2. Mild left basilar atelectasis or scarring. Lungs otherwise clear. 3. Diffuse coronary artery calcifications seen. 4. Small to moderate hiatal hernia noted. 5. Left-sided renal atrophy, with likely left-sided renal cysts and parenchymal calcifications at the lower pole of the left kidney. Marked right renal atrophy. 6. Scattered calcification along the abdominal aorta. 7. Chronic loss of height involving vertebral body L1. Electronically Signed   By: Garald Balding M.D.   On: 08/06/2015 01:09   Ct Chest Wo Contrast  08/06/2015  CLINICAL DATA:  Acute onset of vomiting and tachycardia.  Idiopathic thrombocytopenic purpura. Initial encounter. EXAM: CT CHEST, ABDOMEN AND PELVIS WITHOUT CONTRAST TECHNIQUE: Multidetector CT imaging of the chest, abdomen and pelvis was performed following the standard protocol without IV contrast. COMPARISON:  Chest radiograph performed 08/03/2015 FINDINGS: CT CHEST Mild atelectasis or scarring is noted at the left lung base. The lungs are otherwise clear. There is no evidence of focal consolidation, pleural effusion  or pneumothorax. No masses are identified. Diffuse coronary artery calcifications are seen. A small to moderate hiatal hernia is noted. No mediastinal lymphadenopathy is seen. No pericardial effusion is identified. The great vessels are grossly unremarkable. A right-sided PICC is noted ending about the distal SVC. The thyroid gland is diminutive and grossly unremarkable. No axillary lymphadenopathy is seen. No acute osseous abnormalities are identified. CT ABDOMEN AND PELVIS The liver and spleen are unremarkable in appearance. The gallbladder is within normal limits. The pancreas and adrenal glands are unremarkable. Left-sided renal atrophy is noted, with likely left-sided renal cysts and scattered parenchymal calcifications at the lower pole of the left kidney. The right kidney is markedly atrophic. There is no evidence of hydronephrosis. No renal or ureteral stones are identified. No free fluid is identified. The small bowel is unremarkable in appearance. The stomach is otherwise within normal limits. No acute vascular abnormalities are seen. Scattered calcification is noted along the abdominal aorta. The appendix is not definitely characterized; there is no evidence of appendicitis. The colon is unremarkable in appearance. The bladder is mildly distended and grossly unremarkable. The prostate remains normal in size. No inguinal lymphadenopathy is seen. No acute osseous abnormalities are identified. There is chronic loss of height involving vertebral body  L1. IMPRESSION: 1. No acute abnormality seen within the abdomen or pelvis. 2. Mild left basilar atelectasis or scarring. Lungs otherwise clear. 3. Diffuse coronary artery calcifications seen. 4. Small to moderate hiatal hernia noted. 5. Left-sided renal atrophy, with likely left-sided renal cysts and parenchymal calcifications at the lower pole of the left kidney. Marked right renal atrophy. 6. Scattered calcification along the abdominal aorta. 7. Chronic loss of height involving vertebral body L1. Electronically Signed   By: Garald Balding M.D.   On: 08/06/2015 01:09   US Renal  09/02/2015  CLINICAL DATA:  Renal failure EXAM: RENAL / URINARY TRACT ULTRASOUND COMPLETE COMPARISON:  CT 08/06/2015 FINDINGS: Right Kidney: Absent. Left Kidney: Length: 16.3 cm. Echogenic, with parenchymal thinning consistent with atrophy. Lower pole cortical calcifications are present, similar to the appearances on CT. No conclusive collecting system calculi. No hydronephrosis per Bladder: Appears normal for degree of bladder distention. Left ureteral jet observed. IMPRESSION: Absent right kidney. Left kidney appears atrophic with echogenic parenchyma consistent with medical renal disease. No hydronephrosis. Left ureteral jet documented at the urinary bladder. Electronically Signed   By: Andreas Newport M.D.   On: 09/02/2015 01:54   US Venous Img Lower Bilateral  08/17/2015  CLINICAL DATA:  82 year old male with a history of swelling bilateral lower extremities EXAM: BILATERAL LOWER EXTREMITY VENOUS DOPPLER ULTRASOUND TECHNIQUE: Gray-scale sonography with graded compression, as well as color Doppler and duplex ultrasound were performed to evaluate the lower extremity deep venous systems from the level of the common femoral vein and including the common femoral, femoral, profunda femoral, popliteal and calf veins including the posterior tibial, peroneal and gastrocnemius veins when visible. The superficial great saphenous vein was  also interrogated. Spectral Doppler was utilized to evaluate flow at rest and with distal augmentation maneuvers in the common femoral, femoral and popliteal veins. COMPARISON:  None. FINDINGS: RIGHT LOWER EXTREMITY Common Femoral Vein: No evidence of thrombus. Normal compressibility, respiratory phasicity and response to augmentation. Saphenofemoral Junction: No evidence of thrombus. Normal compressibility and flow on color Doppler imaging. Profunda Femoral Vein: No evidence of thrombus. Normal compressibility and flow on color Doppler imaging. Femoral Vein: No evidence of thrombus. Normal compressibility, respiratory phasicity and response to augmentation. Popliteal Vein: No  evidence of thrombus. Normal compressibility, respiratory phasicity and response to augmentation. Calf Veins: No evidence of thrombus. Normal compressibility and flow on color Doppler imaging. Superficial Great Saphenous Vein: No evidence of thrombus. Normal compressibility and flow on color Doppler imaging. Other Findings: Anechoic lentiform structure in the right popliteal fossa measuring 4.1 cm x 1.5 cm x 2.9 cm. No internal flow. LEFT LOWER EXTREMITY Common Femoral Vein: No evidence of thrombus. Normal compressibility, respiratory phasicity and response to augmentation. Saphenofemoral Junction: No evidence of thrombus. Normal compressibility and flow on color Doppler imaging. Profunda Femoral Vein: No evidence of thrombus. Normal compressibility and flow on color Doppler imaging. Femoral Vein: No evidence of thrombus. Normal compressibility, respiratory phasicity and response to augmentation. Popliteal Vein: No evidence of thrombus. Normal compressibility, respiratory phasicity and response to augmentation. Calf Veins: No evidence of thrombus. Normal compressibility and flow on color Doppler imaging. Superficial Great Saphenous Vein: No evidence of thrombus. Normal compressibility and flow on color Doppler imaging. Other Findings:  None.  IMPRESSION: Sonographic survey of the bilateral lower extremities negative for DVT. Likely Baker cyst in the right popliteal fossa. Signed, Dulcy Fanny. Earleen Newport, DO [facility] Electronically Signed   By: Corrie Mckusick D.O.   On: 08/17/2015 15:45   Dg Chest Port 1 View  09/01/2015  CLINICAL DATA:  Recent removal of the PICC line after developing erythema at the site. Now with developing weakness. EXAM: PORTABLE CHEST 1 VIEW COMPARISON:  08/03/2015 FINDINGS: Mildly prominent right hilar contours are likely vascular, accentuated due to the AP portable acquisition. The lungs are clear. Pulmonary vasculature is normal. There is no large effusion. Heart size is upper normal, unchanged. IMPRESSION: No acute cardiopulmonary findings. Electronically Signed   By: Andreas Newport M.D.   On: 09/01/2015 21:13   Echo 09/02/15 Study Conclusions - Left ventricle: The cavity size was normal. There was mild  concentric hypertrophy. Systolic function was normal. The  estimated ejection fraction was in the range of 60% to 65%. Wall  motion was normal; there were no regional wall motion  abnormalities. Doppler parameters are consistent with abnormal  left ventricular relaxation (grade 1 diastolic dysfunction). - Mitral valve: There was mild regurgitation. - Left atrium: The atrium was normal in size. - Right ventricle: Systolic function was normal. - Pulmonary arteries: Systolic pressure was within the normal  range. Impressions: - Normal study. No valve vegetation noted.  Assessment:   Alejandro Armstrong is a 82 y.o. male with PICC associated MRSA bacteremia in setting of ITP and CKD. Picc line removed 08/30/15. FU BCX 3/18 ngtd.  Echo TTE negative for vegetation. Pt afebrile, denies back pain, had some R shoulder pain but much improved.   Recommendations Will need 2 week course of IV vanco from time of first neg BCX (from 3/18 if remains  negative) WIll need a new picc but he has CKD - I discussed with Dr Gita Kudo and would need a neck line due to CKD Would USS RUE given edema to ro clot - ordered Discussed with patient and his family Thank you very much for allowing me to participate in the care of this patient. Please call with questions.   Cheral Marker. Ola Spurr, MD

## 2015-09-03 NOTE — Evaluation (Signed)
Physical Therapy Evaluation Patient Details Name: Alejandro Armstrong MRN: [id number] DOB: 1929-08-18 Today's Date: 09/03/2015   History of Present Illness  Pt is a 82 y.o. male with PMH of idiopathic thrombocytopenia purpura, HTN, diabetes, renal agenesis, renal disease, chemotherapy.  Pt presented with a PICC line infection.  Pt was admitted for MRSA infection.      Clinical Impression  Prior to admission pt was independent with SPC.  Pt lives with wife.  Pt was CGA for sit to stand and CGA for ambulation with SPC for 200 feet.  Due to aforementioned function and strength deficits, pt is in need of skilled physical therapy.  It is recommended that pt is discharged to home with familial support when medically appropriate.      Follow Up Recommendations No PT follow up    Equipment Recommendations  None recommended by PT    Recommendations for Other Services       Precautions / Restrictions Precautions Precautions: Fall Restrictions Weight Bearing Restrictions: No      Mobility  Bed Mobility               General bed mobility comments: in chair at the beginning and the end of the session.  Transfers Overall transfer level: Needs assistance Equipment used: Rolling walker (2 wheeled) Transfers: Sit to/from Stand Sit to Stand: Min guard         General transfer comment: increased time   Ambulation/Gait Ambulation/Gait assistance: Min guard Ambulation Distance (Feet): 200 Feet Assistive device: Straight cane Gait Pattern/deviations: Step-through pattern;Decreased step length - left Gait velocity: decreased    General Gait Details: body weight shift to the right  Stairs            Wheelchair Mobility    Modified Rankin (Stroke Patients Only)       Balance Overall balance assessment: Needs assistance Sitting-balance support: Feet supported Sitting balance-Leahy Scale: Good     Standing balance support: Single extremity supported (SPC) Standing  balance-Leahy Scale: Good                               Pertinent Vitals/Pain Pain Assessment: No/denies pain  See flow sheet for vitals.     Home Living Family/patient expects to be discharged to:: Private residence   Available Help at Discharge: Family Type of Home: House Home Access: Stairs to enter Entrance Stairs-Rails: Left Entrance Stairs-Number of Steps: 3 Home Layout: One level Home Equipment: Gearhart - single point      Prior Function Level of Independence: Independent with assistive device(s)         Comments: SPC     Hand Dominance        Extremity/Trunk Assessment   Upper Extremity Assessment: Overall WFL for tasks assessed           Lower Extremity Assessment: Generalized weakness      Cervical / Trunk Assessment: Normal  Communication   Communication: No difficulties  Cognition Arousal/Alertness: Awake/alert Behavior During Therapy: WFL for tasks assessed/performed Overall Cognitive Status: Within Functional Limits for tasks assessed                      General Comments   Nursing was contacted and cleared pt for physical therapy.  Pt was agreeable and tolerated session well.  Pt's wife was present during session.     Exercises        Assessment/Plan  PT Assessment Patient needs continued PT services  PT Diagnosis Generalized weakness   PT Problem List Decreased strength;Decreased activity tolerance;Decreased balance;Decreased mobility;Decreased knowledge of use of DME  PT Treatment Interventions DME instruction;Gait training;Stair training;Functional mobility training;Therapeutic activities;Therapeutic exercise;Patient/family education   PT Goals (Current goals can be found in the Care Plan section) Acute Rehab PT Goals Patient Stated Goal: to go home  PT Goal Formulation: With patient Time For Goal Achievement: 09/17/15 Potential to Achieve Goals: Good    Frequency Min 2X/week   Barriers to discharge         Co-evaluation               End of Session Equipment Utilized During Treatment: Gait belt Activity Tolerance: Patient tolerated treatment well Patient left: in chair;with call bell/phone within reach;with chair alarm set;with family/visitor present Nurse Communication: Mobility status         Time: [id number] PT Time Calculation (min) (ACUTE ONLY): 24 min   Charges:         PT G Codes:       Mittie Bodo, SPT Mittie Bodo 09/03/2015, 12:56 PM

## 2015-09-03 NOTE — Care Management Important Message (Signed)
Important Message  Patient Details  Name: NUR KRALIK MRN: [id number] Date of Birth: 27-Aug-1929   Medicare Important Message Given:  Yes    Katrina Stack, RN 09/03/2015, 4:06 PM

## 2015-09-03 NOTE — Progress Notes (Signed)
Results for [place] of arm were called to this nurse stating that patient is positive for DVT in Right arm. Dr. Vianne Bulls notified that results are in epic. No orders received.

## 2015-09-04 ENCOUNTER — Inpatient Hospital Stay: Payer: Medicare HMO

## 2015-09-04 LAB — GLUCOSE, CAPILLARY
GLUCOSE-CAPILLARY: 116 mg/dL — AB (ref 65–99)
GLUCOSE-CAPILLARY: 85 mg/dL (ref 65–99)
Glucose-Capillary: 63 mg/dL — ABNORMAL LOW (ref 65–99)
Glucose-Capillary: 84 mg/dL (ref 65–99)
Glucose-Capillary: 126 mg/dL — ABNORMAL HIGH (ref 65–99)

## 2015-09-04 LAB — CREATININE, SERUM
Creatinine, Ser: 3.12 mg/dL — ABNORMAL HIGH (ref 0.61–1.24)
GFR, EST AFRICAN AMERICAN: 20 mL/min — AB (ref >60)
GFR, EST NON AFRICAN AMERICAN: 17 mL/min — AB (ref >60)

## 2015-09-04 MED ORDER — INSULIN GLARGINE 100 UNIT/ML ~~LOC~~ SOLN
5.0000 [IU] | Freq: Every day | SUBCUTANEOUS | Status: DC
  Administered 2015-09-05 – 2015-09-06 (×2): 5 [IU] via SUBCUTANEOUS
  Filled 2015-09-04 (×2): qty 0.05

## 2015-09-04 NOTE — Progress Notes (Signed)
[place] [facility] [place] NAME: Alejandro Armstrong    MR#:  [id number]  DATE OF BIRTH:  June 16, 1930  SUBJECTIVE: no fever. repeat blood cultures are negative to date from 18th of March. Patient feels better. Platelet count is improved. Does have a blood clot in the right arm but PICC line was removed.   CHIEF COMPLAINT:   Chief Complaint  Patient presents with  . Abnormal Lab   - Patient admitted with MRSA bacteremia, recent PICC line that was infected and removed in the emergency room 2 days ago. -No further fevers now.  -Also noted to have acute renal failure.   REVIEW OF SYSTEMS:  Review of Systems  Constitutional: Positive for malaise/fatigue. Negative for fever and chills.  HENT: Negative for ear discharge, ear pain and nosebleeds.   Eyes: Negative for blurred vision and double vision.  Respiratory: Negative for cough, shortness of breath and wheezing.   Cardiovascular: Positive for leg swelling. Negative for chest pain and palpitations.  Gastrointestinal: Negative for nausea, vomiting, abdominal pain, diarrhea and constipation.  Genitourinary: Negative for dysuria and urgency.  Musculoskeletal: Negative for myalgias.  Neurological: Negative for dizziness, tremors, speech change, focal weakness, seizures and headaches.  Psychiatric/Behavioral: Negative for depression.    DRUG ALLERGIES:  No Known Allergies  VITALS:  Blood pressure 146/73, pulse 66, temperature 98.4 F (36.9 C), temperature source Oral, resp. rate 16, height 5\' 7"  (1.702 m), weight 83.144 kg (183 lb 4.8 oz), SpO2 96 %.  PHYSICAL EXAMINATION:  Physical Exam  GENERAL:  82 y.o.-year-old patient lying in the bed with no acute distress.chroncially ill appearing  EYES: Pupils equal, round, reactive to light and accommodation. No scleral icterus. Extraocular muscles intact.  HEENT: Head atraumatic, normocephalic. Oropharynx and nasopharynx clear.  NECK:  Supple, no  jugular venous distention. No thyroid enlargement, no tenderness.  LUNGS: Normal breath sounds bilaterally, no wheezing, rales,rhonchi or crepitation. No use of accessory muscles of respiration. Decreased bibasilar breath sounds noted CARDIOVASCULAR: S1, S2 normal. No rubs, or gallops. 3/6 systolic murmur is present ABDOMEN: Soft, nontender, nondistended. Bowel sounds present. No organomegaly or mass.  EXTREMITIES: No cyanosis, or clubbing. 2+ edema noted in the extremities PICC line removed site is not tender, but slightly swollen NEUROLOGIC: Cranial nerves II through XII are intact. Muscle strength 5/5 in all extremities. Sensation intact. Gait not checked.  PSYCHIATRIC: The patient is alert and oriented x 3.  SKIN: No obvious rash, lesion, or ulcer. Dry and flaky skin noted   LABORATORY PANEL:   CBC  Recent Labs Lab 09/02/15 0631  WBC 7.3  HGB 8.6*  HCT 25.3*  PLT 158   ------------------------------------------------------------------------------------------------------------------  Chemistries   Recent Labs Lab 09/03/15 0843 09/04/15 0405  NA 136  --   K 4.7  --   CL 112*  --   CO2 20*  --   GLUCOSE 83  --   BUN 49*  --   CREATININE 3.48*  3.53* 3.12*  CALCIUM 7.7*  --    ------------------------------------------------------------------------------------------------------------------  Cardiac Enzymes No results for input(s): TROPONINI in the last 168 hours. ------------------------------------------------------------------------------------------------------------------  RADIOLOGY:  US Venous Img Upper Uni Right  09/03/2015  CLINICAL DATA:  Right upper extremity edema following PICC line infection with subsequent removal. Evaluate for DVT. EXAM: RIGHT UPPER EXTREMITY VENOUS DOPPLER ULTRASOUND TECHNIQUE: Gray-scale sonography with graded compression, as well as color Doppler and duplex ultrasound were performed to evaluate the upper extremity deep venous system  from the level  of the subclavian vein and including the jugular, axillary, basilic, radial, ulnar and upper cephalic vein. Spectral Doppler was utilized to evaluate flow at rest and with distal augmentation maneuvers. COMPARISON:  None. FINDINGS: Contralateral Subclavian Vein: Respiratory phasicity is normal and symmetric with the symptomatic side. No evidence of thrombus. Normal compressibility. Internal Jugular Vein: No evidence of thrombus. Normal compressibility, respiratory phasicity and response to augmentation. Subclavian Vein: No evidence of thrombus. Normal compressibility, respiratory phasicity and response to augmentation. Axillary Vein: No evidence of thrombus. Normal compressibility, respiratory phasicity and response to augmentation. Cephalic Vein: No evidence of thrombus. Normal compressibility, respiratory phasicity and response to augmentation. Basilic Vein: There is hypoechoic nonocclusive thrombus within the right basilic vein at the level of the shoulder (representative image 38). Brachial Veins: There is hypoechoic occlusive slightly expansile thrombus within both of the paired right brachial veins (representative image 34). Radial Veins: No evidence of thrombus. Normal compressibility, respiratory phasicity and response to augmentation. Ulnar Veins: No evidence of thrombus. Normal compressibility, respiratory phasicity and response to augmentation. Venous Reflux:  None visualized. Other Findings: No definitive cystic or solid lesions correlate with the patient's area of swelling within the upper arm (representative images 43 and 44). IMPRESSION: 1. Examination is positive for occlusive DVT within both paired right brachial veins. There is no definitive extension of this DVT to the more central aspect of the right upper extremity venous system. 2. Examination is positive for nonocclusive thrombus within the right basilic vein at the level the shoulder. These results will be called to the  ordering clinician or representative by the Radiologist Assistant, and communication documented in the PACS or zVision Dashboard. Electronically Signed   By: Sandi Mariscal M.D.   On: 09/03/2015 15:27    EKG:   Orders placed or performed during the hospital encounter of 09/01/15  . ED EKG  . ED EKG  . EKG 12-Lead  . EKG 12-Lead  . EKG test  . EKG test    ASSESSMENT AND PLAN:   82 year old male with past medical history significant for ITP, hypertension, hyperlipidemia, CKD baseline creatinine of 2.5 was brought in for MRSA bacteremia from an infected PICC line for his rituxan treatments  #1 MRSA bacteremia-secondary to infected PICC line. PICC line was taken out 2 days ago in the emergency room. -Continue IV vancomycin. Monitor renal function.-ID consulted. Sepsis present on admission. -Repeat blood cultures  Are negative to date.needs 2 weeks of IV Vanco from time of first negative blood cultures,   -Echocardiogram  Did not show any  Vegetations.EF more than 55%  #2 acute on chronic renal insufficiency-baseline creatinine around 2.2. -Creatinine on admission was 4, likely ATN from sepsis.has h/o CKD stage 4; -Appreciate nephrology consult. No acute indication for hemodialysis. Gentle hydration  -Creatinine is improving. Avoid nephrotoxins - #3 hypokalemia-secondary to acute renal failure. Improved after treatment./ #4 elevated TSH- free t4 within in normal limits. Likely from acute infection. Monitor -Continue Synthroid  #5 anemia of chronic disease-stable. Continue to monitor -Continue iron supplements  #6 DVT prophylaxis-on subcutaneous heparin  PT consult. Lives at home with his wife. Uses a cane to ambulate.  7.h/o ITP>;ges Rituximab infusions.platelets 158. Platelets  as low as 5. appreciate oncology opinion regarding anticoagulation for right arm DVT,(has ITP.)_ D/w wife   All the records are reviewed and case discussed with Care Management/Social Workerr. Management  plans discussed with the patient, family and they are in agreement.  CODE STATUS: Full Code  TOTAL TIME TAKING CARE OF  THIS PATIENT: 37 minutes.   POSSIBLE D/C IN 2 DAYS, DEPENDING ON CLINICAL CONDITION.   Epifanio Lesches M.D on 09/04/2015 at 11:57 AM  Between 7am to 6pm - Pager - [phone]  After 6pm go to [email or web address] - password [email or web address] [facility]  Office  [phone]  CC: Primary care physician; Casilda Carls, MD

## 2015-09-04 NOTE — Care Management (Signed)
Did not receive return call from [place].  Place a second call and left another voicemail message with the answering service

## 2015-09-04 NOTE — Progress Notes (Signed)
[facility]  ROUNDING NOTE   Subjective:  Renal function continues to improve. Creatinine down to 3.1. Platelets stable at 158K.   Objective:  Vital signs in last 24 hours:  Temp:  [98.4 F (36.9 C)-98.5 F (36.9 C)] 98.4 F (36.9 C) (03/21 1154) Pulse Rate:  [63-66] 66 (03/21 1154) Resp:  [16-21] 16 (03/21 1154) BP: (142-156)/(68-75) 146/73 mmHg (03/21 1154) SpO2:  [95 %-98 %] 96 % (03/21 1154) Weight:  [83.144 kg (183 lb 4.8 oz)] 83.144 kg (183 lb 4.8 oz) (03/21 0412)  Weight change: 0.953 kg (2 lb 1.6 oz) Filed Weights   09/02/15 0500 09/03/15 0448 09/04/15 0412  Weight: 83.5 kg (184 lb 1.4 oz) 82.192 kg (181 lb 3.2 oz) 83.144 kg (183 lb 4.8 oz)    Intake/Output: I/O last 3 completed shifts: In: 590.8 [I.V.:590.8] Out: 675 [Urine:675]   Intake/Output this shift:  Total I/O In: -  Out: 300 [Urine:300]  Physical Exam: General: NAD, resting in bed.  Head: Normocephalic, atraumatic. Moist oral mucosal membranes  Eyes: Anicteric  Neck: Supple, trachea midline  Lungs:  Clear to auscultation, normal effort  Heart: Regular rate and rhythm no rubs  Abdomen:  Soft, nontender, BS present  Extremities: 1+ peripheral edema, erythema RUE where picc was  Neurologic: Nonfocal, moving all four extremities  Skin: No lesions       Basic Metabolic Panel:  Recent Labs Lab 08/30/15 2024 09/01/15 2052 09/02/15 0631 09/03/15 0843 09/04/15 0405  NA 136 134* 138 136  --   K 5.1 5.3* 4.2 4.7  --   CL 107 105 111 112*  --   CO2 20* 18* 19* 20*  --   GLUCOSE 121* 214* 59* 83  --   BUN 54* 57* 52* 49*  --   CREATININE 3.33* 4.02* 3.71* 3.48*  3.53* 3.12*  CALCIUM 8.1* 7.5* 7.5* 7.7*  --     Liver Function Tests: No results for input(s): AST, ALT, ALKPHOS, BILITOT, PROT, ALBUMIN in the last 168 hours. No results for input(s): LIPASE, AMYLASE in the last 168 hours. No results for input(s): AMMONIA in the last 168 hours.  CBC:  Recent Labs Lab  08/30/15 2024 09/01/15 2052 09/02/15 0631  WBC 10.3 8.9 7.3  NEUTROABS 8.3* 7.7*  --   HGB 9.9* 9.6* 8.6*  HCT 29.2* 27.7* 25.3*  MCV 95.5 95.3 95.6  PLT 120* 165 158    Cardiac Enzymes: No results for input(s): CKTOTAL, CKMB, CKMBINDEX, TROPONINI in the last 168 hours.  BNP: Invalid input(s): POCBNP  CBG:  Recent Labs Lab 09/03/15 1642 09/03/15 2155 09/04/15 0744 09/04/15 0847 09/04/15 1152  GLUCAP 109* 128* 63* 116* 43    Microbiology: Results for orders placed or performed during the hospital encounter of 09/01/15  Culture, blood (Routine X 2) w Reflex to ID Panel     Status: None (Preliminary result)   Collection Time: 09/01/15  8:52 PM  Result Value Ref Range Status   Specimen Description BLOOD LEFT WRIST  Final   Special Requests   Final    BOTTLES DRAWN AEROBIC AND ANAEROBIC 11CCAERO,9CCANA   Culture NO GROWTH 3 DAYS  Final   Report Status PENDING  Incomplete  Culture, blood (Routine X 2) w Reflex to ID Panel     Status: None (Preliminary result)   Collection Time: 09/01/15  8:53 PM  Result Value Ref Range Status   Specimen Description BLOOD RIGHT WRIST  Final   Special Requests BOTTLES DRAWN AEROBIC AND ANAEROBIC [place]  Final   Culture NO GROWTH 3 DAYS  Final   Report Status PENDING  Incomplete  CULTURE, BLOOD (ROUTINE X 2) w Reflex to PCR ID Panel     Status: None (Preliminary result)   Collection Time: 09/03/15  8:49 AM  Result Value Ref Range Status   Specimen Description BLOOD RIGHT ARM  Final   Special Requests   Final    BOTTLES DRAWN AEROBIC AND ANAEROBIC  AEROBIC 3CC, ANAEROBIC 1CC   Culture NO GROWTH 1 DAY  Final   Report Status PENDING  Incomplete  CULTURE, BLOOD (ROUTINE X 2) w Reflex to PCR ID Panel     Status: None (Preliminary result)   Collection Time: 09/03/15  8:53 AM  Result Value Ref Range Status   Specimen Description BLOOD LEFT HAND  Final   Special Requests BOTTLES DRAWN AEROBIC AND ANAEROBIC  0.5CC  Final   Culture NO  GROWTH 1 DAY  Final   Report Status PENDING  Incomplete    Coagulation Studies: No results for input(s): LABPROT, INR in the last 72 hours.  Urinalysis: No results for input(s): COLORURINE, LABSPEC, PHURINE, GLUCOSEU, HGBUR, BILIRUBINUR, KETONESUR, PROTEINUR, UROBILINOGEN, NITRITE, LEUKOCYTESUR in the last 72 hours.  Invalid input(s): APPERANCEUR    Imaging: US Venous Img Upper Uni Right  09/03/2015  CLINICAL DATA:  Right upper extremity edema following PICC line infection with subsequent removal. Evaluate for DVT. EXAM: RIGHT UPPER EXTREMITY VENOUS DOPPLER ULTRASOUND TECHNIQUE: Gray-scale sonography with graded compression, as well as color Doppler and duplex ultrasound were performed to evaluate the upper extremity deep venous system from the level of the subclavian vein and including the jugular, axillary, basilic, radial, ulnar and upper cephalic vein. Spectral Doppler was utilized to evaluate flow at rest and with distal augmentation maneuvers. COMPARISON:  None. FINDINGS: Contralateral Subclavian Vein: Respiratory phasicity is normal and symmetric with the symptomatic side. No evidence of thrombus. Normal compressibility. Internal Jugular Vein: No evidence of thrombus. Normal compressibility, respiratory phasicity and response to augmentation. Subclavian Vein: No evidence of thrombus. Normal compressibility, respiratory phasicity and response to augmentation. Axillary Vein: No evidence of thrombus. Normal compressibility, respiratory phasicity and response to augmentation. Cephalic Vein: No evidence of thrombus. Normal compressibility, respiratory phasicity and response to augmentation. Basilic Vein: There is hypoechoic nonocclusive thrombus within the right basilic vein at the level of the shoulder (representative image 38). Brachial Veins: There is hypoechoic occlusive slightly expansile thrombus within both of the paired right brachial veins (representative image 34). Radial Veins: No  evidence of thrombus. Normal compressibility, respiratory phasicity and response to augmentation. Ulnar Veins: No evidence of thrombus. Normal compressibility, respiratory phasicity and response to augmentation. Venous Reflux:  None visualized. Other Findings: No definitive cystic or solid lesions correlate with the patient's area of swelling within the upper arm (representative images 43 and 44). IMPRESSION: 1. Examination is positive for occlusive DVT within both paired right brachial veins. There is no definitive extension of this DVT to the more central aspect of the right upper extremity venous system. 2. Examination is positive for nonocclusive thrombus within the right basilic vein at the level the shoulder. These results will be called to the ordering clinician or representative by the Radiologist Assistant, and communication documented in the PACS or zVision Dashboard. Electronically Signed   By: Sandi Mariscal M.D.   On: 09/03/2015 15:27     Medications:   . sodium chloride 50 mL/hr at 09/04/15 0905   . amLODipine  5 mg Oral Daily  . calcitRIOL  0.25 mcg Oral Daily  . cholecalciferol  1,000 Units Oral Daily  . ferrous sulfate  325 mg Oral BID WC  . heparin  5,000 Units Subcutaneous 3 times per day  . insulin aspart  0-15 Units Subcutaneous TID WC  . [START ON 09/05/2015] insulin glargine  5 Units Subcutaneous Daily  . levothyroxine  125 mcg Oral QAC breakfast  . sodium bicarbonate  650 mg Oral Daily  . vancomycin  1,000 mg Intravenous Q36H   acetaminophen, polyethylene glycol  Assessment/ Plan:  82 y.o. 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   1. Chronic kidney disease stage IV/congenitally absent right  kidney/proteinuria/ARF due to MRSA sepsis 2. Hyperkalemia 3. Diabetes with CKD 4. MRSA sepsis due to infected PICC. 5.  ITP treated with prednisone and rituximab.  Plan: Acute renal failure seems to be improving. Creatinine down to 3.12. Hyperkalemia resolved with a potassium of 4.7. Continue IV fluid hydration for 1 additional day and follow-up renal function tomorrow. Continue treatment of MRSA sepsis with vancomycin. He will need at least 2 weeks of and a Milex per infectious disease. His ICP however appears to be stable at the moment but most recent platelets of 158,000.  Continue to monitor progress.   LOS: 3 Alejandro Armstrong 3/21/20173:29 PM

## 2015-09-04 NOTE — Progress Notes (Signed)
Consent for PICC line signed by patient and placed in chart.

## 2015-09-04 NOTE — Care Management (Addendum)
Have not received return call from [facility].  Have found that patient is open to [facility].  Have reached out to liasion to discuss care being provided.  Has been evaluated by ID and recommendation of 2 week course of IV Vancomycin from the date of the next negative culture.  Currently receiving 1000mg  IV q 36 hours.  Infusion time is 60 mins.  New PICC will be inserted in neck.  Patient has a DVT at the insertion site of his first picc.   Will have to discuss infusion responsibility with caregivers.  Will care will continue to follow.  Discussed adding aide and social work

## 2015-09-04 NOTE — Progress Notes (Signed)
[facility] INFECTIOUS DISEASE PROGRESS NOTE Date of Admission:  09/01/2015     ID: Alejandro Armstrong is a 82 y.o. male with PICC associated MRSA bacteremia  Principal Problem:   MRSA bacteremia secondary to PICC line infection Active Problems:   ITP (idiopathic thrombocytopenic purpura)   Acute on chronic renal insufficiency (HCC)   Hyperkalemia   Type 2 diabetes mellitus (HCC)   Subjective: Feels better, no fevers,   ROS  Eleven systems are reviewed and negative except per hpi  Medications:  Antibiotics Given (last 72 hours)    Date/Time Action Medication Dose Rate   09/03/15 1245 Given   vancomycin (VANCOCIN) IVPB 1000 mg/200 mL premix 1,000 mg 200 mL/hr     . amLODipine  5 mg Oral Daily  . calcitRIOL  0.25 mcg Oral Daily  . cholecalciferol  1,000 Units Oral Daily  . ferrous sulfate  325 mg Oral BID WC  . heparin  5,000 Units Subcutaneous 3 times per day  . insulin aspart  0-15 Units Subcutaneous TID WC  . insulin glargine  10 Units Subcutaneous Daily  . levothyroxine  125 mcg Oral QAC breakfast  . sodium bicarbonate  650 mg Oral Daily  . vancomycin  1,000 mg Intravenous Q36H    Objective: Vital signs in last 24 hours: Temp:  [98.4 F (36.9 C)-98.5 F (36.9 C)] 98.4 F (36.9 C) (03/21 1154) Pulse Rate:  [63-66] 66 (03/21 1154) Resp:  [16-21] 16 (03/21 1154) BP: (142-156)/(68-75) 146/73 mmHg (03/21 1154) SpO2:  [95 %-98 %] 96 % (03/21 1154) Weight:  [83.144 kg (183 lb 4.8 oz)] 83.144 kg (183 lb 4.8 oz) (03/21 0412) Constitutional: oriented to person, place, and time. appears well-developed and well-nourished. No distress.  HENT: [place]/AT, PERRLA, no scleral icterus Mouth/Throat: Oropharynx is clear and moist. No oropharyngeal exudate.  Cardiovascular: Normal rate, regular rhythm - distant HS  Pulmonary/Chest: Effort normal and breath sounds normal. No respiratory distress. has no wheezes.  Neck supple, no nuchal rigidity Abdominal: Soft. Bowel sounds  are normal. exhibits no distension. There is no tenderness.  Lymphadenopathy: no cervical adenopathy. No axillary adenopathy EXT RLE - 2+ edema Neurological: alert and oriented to person, place, and time.  Skin: Skin is warm and dry, flaky skin bil le Psychiatric: a normal mood and affect. behavior is normal.   Lab Results  Recent Labs  09/01/15 2052 09/02/15 0631 09/03/15 0843 09/04/15 0405  WBC 8.9 7.3  --   --   HGB 9.6* 8.6*  --   --   HCT 27.7* 25.3*  --   --   NA 134* 138 136  --   K 5.3* 4.2 4.7  --   CL 105 111 112*  --   CO2 18* 19* 20*  --   BUN 57* 52* 49*  --   CREATININE 4.02* 3.71* 3.48*  3.53* 3.12*    Microbiology: Results for orders placed or performed during the hospital encounter of 09/01/15  Culture, blood (Routine X 2) w Reflex to ID Panel     Status: None (Preliminary result)   Collection Time: 09/01/15  8:52 PM  Result Value Ref Range Status   Specimen Description BLOOD LEFT WRIST  Final   Special Requests   Final    BOTTLES DRAWN AEROBIC AND ANAEROBIC 11CCAERO,9CCANA   Culture NO GROWTH 3 DAYS  Final   Report Status PENDING  Incomplete  Culture, blood (Routine X 2) w Reflex to ID Panel     Status: None (Preliminary result)  Collection Time: 09/01/15  8:53 PM  Result Value Ref Range Status   Specimen Description BLOOD RIGHT WRIST  Final   Special Requests BOTTLES DRAWN AEROBIC AND ANAEROBIC [place]  Final   Culture NO GROWTH 3 DAYS  Final   Report Status PENDING  Incomplete  CULTURE, BLOOD (ROUTINE X 2) w Reflex to PCR ID Panel     Status: None (Preliminary result)   Collection Time: 09/03/15  8:49 AM  Result Value Ref Range Status   Specimen Description BLOOD RIGHT ARM  Final   Special Requests   Final    BOTTLES DRAWN AEROBIC AND ANAEROBIC  AEROBIC 3CC, ANAEROBIC 1CC   Culture NO GROWTH 1 DAY  Final   Report Status PENDING  Incomplete  CULTURE, BLOOD (ROUTINE X 2) w Reflex to PCR ID Panel     Status: None (Preliminary result)    Collection Time: 09/03/15  8:53 AM  Result Value Ref Range Status   Specimen Description BLOOD LEFT HAND  Final   Special Requests BOTTLES DRAWN AEROBIC AND ANAEROBIC  0.5CC  Final   Culture NO GROWTH 1 DAY  Final   Report Status PENDING  Incomplete    Studies/Results: US Venous Img Upper Uni Right  09/03/2015  CLINICAL DATA:  Right upper extremity edema following PICC line infection with subsequent removal. Evaluate for DVT. EXAM: RIGHT UPPER EXTREMITY VENOUS DOPPLER ULTRASOUND TECHNIQUE: Gray-scale sonography with graded compression, as well as color Doppler and duplex ultrasound were performed to evaluate the upper extremity deep venous system from the level of the subclavian vein and including the jugular, axillary, basilic, radial, ulnar and upper cephalic vein. Spectral Doppler was utilized to evaluate flow at rest and with distal augmentation maneuvers. COMPARISON:  None. FINDINGS: Contralateral Subclavian Vein: Respiratory phasicity is normal and symmetric with the symptomatic side. No evidence of thrombus. Normal compressibility. Internal Jugular Vein: No evidence of thrombus. Normal compressibility, respiratory phasicity and response to augmentation. Subclavian Vein: No evidence of thrombus. Normal compressibility, respiratory phasicity and response to augmentation. Axillary Vein: No evidence of thrombus. Normal compressibility, respiratory phasicity and response to augmentation. Cephalic Vein: No evidence of thrombus. Normal compressibility, respiratory phasicity and response to augmentation. Basilic Vein: There is hypoechoic nonocclusive thrombus within the right basilic vein at the level of the shoulder (representative image 38). Brachial Veins: There is hypoechoic occlusive slightly expansile thrombus within both of the paired right brachial veins (representative image 34). Radial Veins: No evidence of thrombus. Normal compressibility, respiratory phasicity and response to augmentation. Ulnar  Veins: No evidence of thrombus. Normal compressibility, respiratory phasicity and response to augmentation. Venous Reflux:  None visualized. Other Findings: No definitive cystic or solid lesions correlate with the patient's area of swelling within the upper arm (representative images 43 and 44). IMPRESSION: 1. Examination is positive for occlusive DVT within both paired right brachial veins. There is no definitive extension of this DVT to the more central aspect of the right upper extremity venous system. 2. Examination is positive for nonocclusive thrombus within the right basilic vein at the level the shoulder. These results will be called to the ordering clinician or representative by the Radiologist Assistant, and communication documented in the PACS or zVision Dashboard. Electronically Signed   By: Sandi Mariscal M.D.   On: 09/03/2015 15:27    Assessment/Plan: TECUMSEH JAVADI is a 82 y.o. male with PICC associated MRSA bacteremia in setting of ITP and CKD. Picc line removed 08/30/15. FU BCX 3/18 ngtd. Echo TTE negative for vegetation. Pt  afebrile, denies back pain, had some R shoulder pain but much improved.  Has DVT RUE at picc site  Recommendations Will need 2 week course of IV vanco from time of first neg BCX (from 3/18 if remains negative) WIll need a new picc but he has CKD - I discussed with Dr Gita Kudo and would need a neck line due to CKD Ordered PICC placement for neck -  Discussed with patient and his family Thank you very much for the consult. Will follow with you.  [place], [place]   09/04/2015, 2:45 PM

## 2015-09-04 NOTE — Progress Notes (Signed)
Hypoglycemic Event  CBG: 63  Treatment: 15 GM carbohydrate snack  Symptoms: None  Follow-up CBG: [id number] CBG Result:116  Possible Reasons for Event: Unknown  Comments/MD notified:n/a    [name]

## 2015-09-04 NOTE — Plan of Care (Signed)
Problem: Education: Goal: Knowledge of [facility] Education information/materials will improve Outcome: Progressing Patient and spouse educated on plan of care  Problem: Skin Integrity: Goal: Risk for impaired skin integrity will decrease Outcome: Progressing Patient sat up in chair and ambulated around unit today.  Problem: Tissue Perfusion: Goal: Risk factors for ineffective tissue perfusion will decrease Outcome: Progressing SQ heparin     Problem: Activity: Goal: Risk for activity intolerance will decrease Outcome: Progressing Patient ambulated around nurses station 4x (approx. 850 ft) with NT. Tolerated well.   Problem: Physical Regulation: Goal: Signs and symptoms of infection will decrease Outcome: Progressing AM lab work WNL. Patient has remained afebrile and VSS.

## 2015-09-05 LAB — GLUCOSE, CAPILLARY
GLUCOSE-CAPILLARY: 82 mg/dL (ref 65–99)
GLUCOSE-CAPILLARY: 116 mg/dL — AB (ref 65–99)
Glucose-Capillary: 105 mg/dL — ABNORMAL HIGH (ref 65–99)
Glucose-Capillary: 126 mg/dL — ABNORMAL HIGH (ref 65–99)

## 2015-09-05 LAB — BASIC METABOLIC PANEL
ANION GAP: 4 — AB (ref 5–15)
BUN: 34 mg/dL — ABNORMAL HIGH (ref 6–20)
CALCIUM: 7.5 mg/dL — AB (ref 8.9–10.3)
CO2: 15 mmol/L — ABNORMAL LOW (ref 22–32)
Chloride: 115 mmol/L — ABNORMAL HIGH (ref 101–111)
Creatinine, Ser: 2.78 mg/dL — ABNORMAL HIGH (ref 0.61–1.24)
GFR, EST AFRICAN AMERICAN: 23 mL/min — AB (ref >60)
GFR, EST NON AFRICAN AMERICAN: 20 mL/min — AB (ref >60)
GLUCOSE: 130 mg/dL — AB (ref 65–99)
Potassium: 4.8 mmol/L (ref 3.5–5.1)
Sodium: 134 mmol/L — ABNORMAL LOW (ref 135–145)

## 2015-09-05 LAB — CBC
HCT: 28.1 % — ABNORMAL LOW (ref 40.0–52.0)
Hemoglobin: 9.3 g/dL — ABNORMAL LOW (ref 13.0–18.0)
MCH: 32 pg (ref 26.0–34.0)
MCHC: 32.9 g/dL (ref 32.0–36.0)
MCV: 97 fL (ref 80.0–100.0)
PLATELETS: 201 10*3/uL (ref 150–440)
RBC: 2.9 MIL/uL — AB (ref 4.40–5.90)
RDW: 14.7 % — AB (ref 11.5–14.5)
WBC: 6.5 10*3/uL (ref 3.8–10.6)

## 2015-09-05 MED ORDER — SODIUM CHLORIDE 0.9% FLUSH
10.0000 mL | INTRAVENOUS | Status: DC | PRN
  Administered 2015-09-05: 10 mL
  Administered 2015-09-05: 20 mL
  Filled 2015-09-05 (×2): qty 40

## 2015-09-05 NOTE — Progress Notes (Signed)
[place] INFECTIOUS DISEASE PROGRESS NOTE Date of Admission:  09/01/2015     ID: Alejandro Armstrong is a 82 y.o. male with PICC associated MRSA bacteremia  Principal Problem:   MRSA bacteremia secondary to PICC line infection Active Problems:   ITP (idiopathic thrombocytopenic purpura)   Acute on chronic renal insufficiency (HCC)   Hyperkalemia   Type 2 diabetes mellitus (HCC)   Subjective: No fevers, R IJ picc placed  ROS  Eleven systems are reviewed and negative except per hpi  Medications:  Antibiotics Given (last 72 hours)    Date/Time Action Medication Dose Rate   09/03/15 1245 Given   vancomycin (VANCOCIN) IVPB 1000 mg/200 mL premix 1,000 mg 200 mL/hr   09/04/15 2230 Given   vancomycin (VANCOCIN) IVPB 1000 mg/200 mL premix 1,000 mg 200 mL/hr     . amLODipine  5 mg Oral Daily  . calcitRIOL  0.25 mcg Oral Daily  . cholecalciferol  1,000 Units Oral Daily  . ferrous sulfate  325 mg Oral BID WC  . heparin  5,000 Units Subcutaneous 3 times per day  . insulin aspart  0-15 Units Subcutaneous TID WC  . insulin glargine  5 Units Subcutaneous Daily  . levothyroxine  125 mcg Oral QAC breakfast  . sodium bicarbonate  650 mg Oral Daily  . vancomycin  1,000 mg Intravenous Q36H    Objective: Vital signs in last 24 hours: Temp:  [98.1 F (36.7 C)-99.8 F (37.7 C)] 98.1 F (36.7 C) (03/22 1213) Pulse Rate:  [64-70] 64 (03/22 1213) Resp:  [20] 20 (03/22 0357) BP: (152-155)/(64-73) 152/64 mmHg (03/22 1213) SpO2:  [97 %-99 %] 99 % (03/22 1213) Weight:  [82.736 kg (182 lb 6.4 oz)] 82.736 kg (182 lb 6.4 oz) (03/22 0357) Constitutional: oriented to person, place, and time. appears well-developed and well-nourished. No distress.  HENT: [place]/AT, PERRLA, no scleral icterus Mouth/Throat: Oropharynx is clear and moist. No oropharyngeal exudate.  Cardiovascular: Normal rate, regular rhythm - distant HS  Pulmonary/Chest: Effort normal and breath sounds normal. No respiratory  distress. has no wheezes.  Neck supple, no nuchal rigidity Abdominal: Soft. Bowel sounds are normal. exhibits no distension. There is no tenderness.  Lymphadenopathy: no cervical adenopathy. No axillary adenopathy EXT RLE - 2+ edema Neurological: alert and oriented to person, place, and time.  Skin: Skin is warm and dry, flaky skin bil le Psychiatric: a normal mood and affect. behavior is normal.   Lab Results  Recent Labs  09/03/15 0843 09/04/15 0405 09/05/15 0855 09/05/15 0925  WBC  --   --  6.5  --   HGB  --   --  9.3*  --   HCT  --   --  28.1*  --   NA 136  --   --  134*  K 4.7  --   --  4.8  CL 112*  --   --  115*  CO2 20*  --   --  15*  BUN 49*  --   --  34*  CREATININE 3.48*  3.53* 3.12*  --  2.78*    Microbiology: Results for orders placed or performed during the hospital encounter of 09/01/15  Culture, blood (Routine X 2) w Reflex to ID Panel     Status: None (Preliminary result)   Collection Time: 09/01/15  8:52 PM  Result Value Ref Range Status   Specimen Description BLOOD LEFT WRIST  Final   Special Requests   Final    BOTTLES DRAWN AEROBIC AND  ANAEROBIC [id number],9CCANA   Culture NO GROWTH 4 DAYS  Final   Report Status PENDING  Incomplete  Culture, blood (Routine X 2) w Reflex to ID Panel     Status: None (Preliminary result)   Collection Time: 09/01/15  8:53 PM  Result Value Ref Range Status   Specimen Description BLOOD RIGHT WRIST  Final   Special Requests BOTTLES DRAWN AEROBIC AND ANAEROBIC [place]  Final   Culture NO GROWTH 4 DAYS  Final   Report Status PENDING  Incomplete  CULTURE, BLOOD (ROUTINE X 2) w Reflex to PCR ID Panel     Status: None (Preliminary result)   Collection Time: 09/03/15  8:49 AM  Result Value Ref Range Status   Specimen Description BLOOD RIGHT ARM  Final   Special Requests   Final    BOTTLES DRAWN AEROBIC AND ANAEROBIC  AEROBIC 3CC, ANAEROBIC 1CC   Culture NO GROWTH 2 DAYS  Final   Report Status PENDING   Incomplete  CULTURE, BLOOD (ROUTINE X 2) w Reflex to PCR ID Panel     Status: None (Preliminary result)   Collection Time: 09/03/15  8:53 AM  Result Value Ref Range Status   Specimen Description BLOOD LEFT HAND  Final   Special Requests BOTTLES DRAWN AEROBIC AND ANAEROBIC  0.5CC  Final   Culture NO GROWTH 2 DAYS  Final   Report Status PENDING  Incomplete    Studies/Results: Dg Chest 1 View  09/04/2015  CLINICAL DATA:  Status post PICC line placement EXAM: CHEST 1 VIEW COMPARISON:  09/01/2015 FINDINGS: Cardiac shadow is stable. The lungs are well aerated bilaterally. A new right jugular PICC line is noted with the catheter tip in the distal superior vena cava. No pneumothorax is noted. No bony abnormality is seen. IMPRESSION: PICC line in satisfactory position. No other focal abnormality is noted. Electronically Signed   By: Inez Catalina M.D.   On: 09/04/2015 17:26   US Venous Img Upper Uni Right  09/03/2015  CLINICAL DATA:  Right upper extremity edema following PICC line infection with subsequent removal. Evaluate for DVT. EXAM: RIGHT UPPER EXTREMITY VENOUS DOPPLER ULTRASOUND TECHNIQUE: Gray-scale sonography with graded compression, as well as color Doppler and duplex ultrasound were performed to evaluate the upper extremity deep venous system from the level of the subclavian vein and including the jugular, axillary, basilic, radial, ulnar and upper cephalic vein. Spectral Doppler was utilized to evaluate flow at rest and with distal augmentation maneuvers. COMPARISON:  None. FINDINGS: Contralateral Subclavian Vein: Respiratory phasicity is normal and symmetric with the symptomatic side. No evidence of thrombus. Normal compressibility. Internal Jugular Vein: No evidence of thrombus. Normal compressibility, respiratory phasicity and response to augmentation. Subclavian Vein: No evidence of thrombus. Normal compressibility, respiratory phasicity and response to augmentation. Axillary Vein: No evidence  of thrombus. Normal compressibility, respiratory phasicity and response to augmentation. Cephalic Vein: No evidence of thrombus. Normal compressibility, respiratory phasicity and response to augmentation. Basilic Vein: There is hypoechoic nonocclusive thrombus within the right basilic vein at the level of the shoulder (representative image 38). Brachial Veins: There is hypoechoic occlusive slightly expansile thrombus within both of the paired right brachial veins (representative image 34). Radial Veins: No evidence of thrombus. Normal compressibility, respiratory phasicity and response to augmentation. Ulnar Veins: No evidence of thrombus. Normal compressibility, respiratory phasicity and response to augmentation. Venous Reflux:  None visualized. Other Findings: No definitive cystic or solid lesions correlate with the patient's area of swelling within the upper arm (representative images 43 and  44). IMPRESSION: 1. Examination is positive for occlusive DVT within both paired right brachial veins. There is no definitive extension of this DVT to the more central aspect of the right upper extremity venous system. 2. Examination is positive for nonocclusive thrombus within the right basilic vein at the level the shoulder. These results will be called to the ordering clinician or representative by the Radiologist Assistant, and communication documented in the PACS or zVision Dashboard. Electronically Signed   By: Sandi Mariscal M.D.   On: 09/03/2015 15:27    Assessment/Plan: Alejandro Armstrong is a 82 y.o. male with PICC associated MRSA bacteremia in setting of ITP and CKD. Picc line removed 08/30/15. FU BCX 3/18 ngtd. Echo TTE negative for vegetation. Pt afebrile, denies back pain, had some R shoulder pain but much improved.  Has DVT RUE at picc site. R picc IJ placed  Recommendations Will need 2 week course of IV vanco from time of first neg BCX (from 3/18 if remains negative) I will need to follow after  stopping abx as the DVT may be seeded and could cause recurrent infection.  Currently dosing of vanco is q 36 - will order as such and can adjust if needed based on trough. See abx order sheet Discussed with patient and his family Thank you very much for the consult. Will follow with you.  [place], [place]   09/05/2015, 2:56 PM

## 2015-09-05 NOTE — Progress Notes (Signed)
[facility] [place] NAME: Alejandro Armstrong    MR#:  [id number]  DATE OF BIRTH:  Nov 05, 1929  SUBJECTIVE: no fever. repeat blood cultures are negative to date from 18th of March. Patient feels better. Platelet count is improved. Does have a blood clot in the right arm but PICC line was removed. received a new PICC line in the right IJ.   CHIEF COMPLAINT:   Chief Complaint  Patient presents with  . Abnormal Lab   - Patient admitted with MRSA bacteremia, recent PICC line that was infected and removed in the emergency room 2 days ago. -No further fevers now.  -Also noted to have acute renal failure.   REVIEW OF SYSTEMS:  Review of Systems  Constitutional: Positive for malaise/fatigue. Negative for fever and chills.  HENT: Negative for ear discharge, ear pain and nosebleeds.   Eyes: Negative for blurred vision and double vision.  Respiratory: Negative for cough, shortness of breath and wheezing.   Cardiovascular: Positive for leg swelling. Negative for chest pain and palpitations.  Gastrointestinal: Negative for nausea, vomiting, abdominal pain, diarrhea and constipation.  Genitourinary: Negative for dysuria and urgency.  Musculoskeletal: Negative for myalgias.  Neurological: Negative for dizziness, tremors, speech change, focal weakness, seizures and headaches.  Psychiatric/Behavioral: Negative for depression.    DRUG ALLERGIES:  No Known Allergies  VITALS:  Blood pressure 152/64, pulse 64, temperature 98.1 F (36.7 C), temperature source Oral, resp. rate 20, height 5\' 7"  (1.702 m), weight 82.736 kg (182 lb 6.4 oz), SpO2 99 %.  PHYSICAL EXAMINATION:  Physical Exam  GENERAL:  82 y.o.-year-old patient lying in the bed with no acute distress.chroncially ill appearing  EYES: Pupils equal, round, reactive to light and accommodation. No scleral icterus. Extraocular muscles intact.  HEENT: Head atraumatic, normocephalic. Oropharynx  and nasopharynx clear.  NECK:  Supple, no jugular venous distention. No thyroid enlargement, no tenderness.  LUNGS: Normal breath sounds bilaterally, no wheezing, rales,rhonchi or crepitation. No use of accessory muscles of respiration. Decreased bibasilar breath sounds noted CARDIOVASCULAR: S1, S2 normal. No rubs, or gallops. 3/6 systolic murmur is present ABDOMEN: Soft, nontender, nondistended. Bowel sounds present. No organomegaly or mass.  EXTREMITIES: No cyanosis, or clubbing. 2+ edema noted in the extremities PICC line removed site is not tender, but slightly swollen NEUROLOGIC: Cranial nerves II through XII are intact. Muscle strength 5/5 in all extremities. Sensation intact. Gait not checked.  PSYCHIATRIC: The patient is alert and oriented x 3.  SKIN: No obvious rash, lesion, or ulcer. Dry and flaky skin noted   LABORATORY PANEL:   CBC  Recent Labs Lab 09/02/15 0631  WBC 7.3  HGB 8.6*  HCT 25.3*  PLT 158   ------------------------------------------------------------------------------------------------------------------  Chemistries   Recent Labs Lab 09/05/15 0925  NA 134*  K 4.8  CL 115*  CO2 15*  GLUCOSE 130*  BUN 34*  CREATININE 2.78*  CALCIUM 7.5*   ------------------------------------------------------------------------------------------------------------------  Cardiac Enzymes No results for input(s): TROPONINI in the last 168 hours. ------------------------------------------------------------------------------------------------------------------  RADIOLOGY:  Dg Chest 1 View  09/04/2015  CLINICAL DATA:  Status post PICC line placement EXAM: CHEST 1 VIEW COMPARISON:  09/01/2015 FINDINGS: Cardiac shadow is stable. The lungs are well aerated bilaterally. A new right jugular PICC line is noted with the catheter tip in the distal superior vena cava. No pneumothorax is noted. No bony abnormality is seen. IMPRESSION: PICC line in satisfactory position. No other  focal abnormality is noted. Electronically Signed   By:  Inez Catalina M.D.   On: 09/04/2015 17:26   US Venous Img Upper Uni Right  09/03/2015  CLINICAL DATA:  Right upper extremity edema following PICC line infection with subsequent removal. Evaluate for DVT. EXAM: RIGHT UPPER EXTREMITY VENOUS DOPPLER ULTRASOUND TECHNIQUE: Gray-scale sonography with graded compression, as well as color Doppler and duplex ultrasound were performed to evaluate the upper extremity deep venous system from the level of the subclavian vein and including the jugular, axillary, basilic, radial, ulnar and upper cephalic vein. Spectral Doppler was utilized to evaluate flow at rest and with distal augmentation maneuvers. COMPARISON:  None. FINDINGS: Contralateral Subclavian Vein: Respiratory phasicity is normal and symmetric with the symptomatic side. No evidence of thrombus. Normal compressibility. Internal Jugular Vein: No evidence of thrombus. Normal compressibility, respiratory phasicity and response to augmentation. Subclavian Vein: No evidence of thrombus. Normal compressibility, respiratory phasicity and response to augmentation. Axillary Vein: No evidence of thrombus. Normal compressibility, respiratory phasicity and response to augmentation. Cephalic Vein: No evidence of thrombus. Normal compressibility, respiratory phasicity and response to augmentation. Basilic Vein: There is hypoechoic nonocclusive thrombus within the right basilic vein at the level of the shoulder (representative image 38). Brachial Veins: There is hypoechoic occlusive slightly expansile thrombus within both of the paired right brachial veins (representative image 34). Radial Veins: No evidence of thrombus. Normal compressibility, respiratory phasicity and response to augmentation. Ulnar Veins: No evidence of thrombus. Normal compressibility, respiratory phasicity and response to augmentation. Venous Reflux:  None visualized. Other Findings: No definitive cystic  or solid lesions correlate with the patient's area of swelling within the upper arm (representative images 43 and 44). IMPRESSION: 1. Examination is positive for occlusive DVT within both paired right brachial veins. There is no definitive extension of this DVT to the more central aspect of the right upper extremity venous system. 2. Examination is positive for nonocclusive thrombus within the right basilic vein at the level the shoulder. These results will be called to the ordering clinician or representative by the Radiologist Assistant, and communication documented in the PACS or zVision Dashboard. Electronically Signed   By: Sandi Mariscal M.D.   On: 09/03/2015 15:27    EKG:   Orders placed or performed during the hospital encounter of 09/01/15  . ED EKG  . ED EKG  . EKG 12-Lead  . EKG 12-Lead  . EKG test  . EKG test    ASSESSMENT AND PLAN:   82 year old male with past medical history significant for ITP, hypertension, hyperlipidemia, CKD baseline creatinine of 2.5 was brought in for MRSA bacteremia from an infected PICC line for his rituxan treatments  #1 MRSA bacteremia-secondary to infected PICC line. PICC line was taken out 2 days ago in the emergency room. -Continue IV vancomycin. Monitor renal function.-ID consulted. Sepsis present on admission. -Repeat blood cultures  Are negative to date.needs 2 weeks of IV Vanco Every 36 hours. from time of first negative blood cultures., wife s requesting home health nurse to administer antibiotics. - Echocardiogram  Did not show any  Vegetations.EF more than 55%  #2 acute on chronic renal insufficiency-baseline creatinine around 2.2. -Creatinine on admission was 4, likely ATN from sepsis.has h/o CKD stage 4; -Appreciate nephrology consult. No acute indication for hemodialysis. -Creatinine is improving. Avoid nephrotoxins - #3 hypokalemia-secondary to acute renal failure. Improved after treatment./ #4 elevated TSH- free t4 within in normal  limits. Likely from acute infection. Monitor -Continue Synthroid  #5 anemia of chronic disease-stable. Continue to monitor -Continue iron supplements  #6  DVT prophylaxis-on subcutaneous heparin  PT consult. Lives at home with his wife. Uses a cane to ambulate.  7.h/o ITP>;ges Rituximab infusions.platelets 158. Platelets  as low as 5. appreciate oncology opinion regarding anticoagulation for right arm DVT,(has ITP.)_ D/w wife   All the records are reviewed and case discussed with Care Management/Social Workerr. Management plans discussed with the patient, family and they are in agreement.  CODE STATUS: Full Code  TOTAL TIME TAKING CARE OF THIS PATIENT: 25 minutes.   POSSIBLE D/C IN 2 DAYS, DEPENDING ON CLINICAL CONDITION.   Epifanio Lesches M.D on 09/05/2015 at 1:32 PM  Between 7am to 6pm - Pager - [phone]  After 6pm go to [email or web address] - password [email or web address] [facility]  Office  [phone]  CC: Primary care physician; Casilda Carls, MD

## 2015-09-05 NOTE — Progress Notes (Signed)
Pt had 10 beats SVT, asymptomatic, resting comfortably, HR returned to sinus brady in the 10s. MD Dr. Jannifer Franklin notified. RN will continue to monitor. Rachael Fee, RN

## 2015-09-05 NOTE — Care Management (Addendum)
Have not received return call from [place] after three requests for return call.

## 2015-09-05 NOTE — Care Management Important Message (Signed)
Important Message  Patient Details  Name: Alejandro Armstrong MRN: [id number] Date of Birth: 1930-02-06   Medicare Important Message Given:  Yes    Katrina Stack, RN 09/05/2015, 2:29 PM

## 2015-09-05 NOTE — Progress Notes (Signed)
Infectious Disease Long Term IV Antibiotic Orders  Diagnosis: MRSA line infection, DVT RUE  Culture results MRSA   Allergies: No Known Allergies  Discharge antibiotics Vancomycin     1000 mg  every     36          hours .     Goal vancomycin trough 15-20.    Pharmacy to adjust dosing based on levels  PICC Care per protocol Labs weekly while on IV antibiotics      CBC w diff   Comprehensive met panel Vancomycin Trough   CRP   Planned duration of antibiotics 2 weeks from 3/18   Stop date 09/16/15  Follow up clinic date TBD approx 4/2 FAX weekly labs to [phone]  Leonel Ramsay, MD

## 2015-09-05 NOTE — Progress Notes (Signed)
[facility]  ROUNDING NOTE   Subjective:  Renal function continues to improve. Creatinine down to 2.78 which is close to his baseline. Patient feeling better. He now has an IJ PICC in place.  Objective:  Vital signs in last 24 hours:  Temp:  [98.1 F (36.7 C)-99.8 F (37.7 C)] 98.1 F (36.7 C) (03/22 1213) Pulse Rate:  [64-70] 64 (03/22 1213) Resp:  [20] 20 (03/22 0357) BP: (152-155)/(64-73) 152/64 mmHg (03/22 1213) SpO2:  [97 %-99 %] 99 % (03/22 1213) Weight:  [82.736 kg (182 lb 6.4 oz)] 82.736 kg (182 lb 6.4 oz) (03/22 0357)  Weight change: -0.408 kg (-14.4 oz) Filed Weights   09/03/15 0448 09/04/15 0412 09/05/15 0357  Weight: 82.192 kg (181 lb 3.2 oz) 83.144 kg (183 lb 4.8 oz) 82.736 kg (182 lb 6.4 oz)    Intake/Output: I/O last 3 completed shifts: In: 1760 [I.V.:1760] Out: 950 [Urine:950]   Intake/Output this shift:     Physical Exam: General: NAD, resting in bed.  Head: Normocephalic, atraumatic. Moist oral mucosal membranes  Eyes: Anicteric  Neck: Supple, trachea midline, IJ PICC  Lungs:  Clear to auscultation, normal effort  Heart: Regular rate and rhythm no rubs  Abdomen:  Soft, nontender, BS present  Extremities: 1+ peripheral edema, erythema RUE where picc was  Neurologic: Nonfocal, moving all four extremities  Skin: No lesions       Basic Metabolic Panel:  Recent Labs Lab 08/30/15 2024 09/01/15 2052 09/02/15 0631 09/03/15 0843 09/04/15 0405 09/05/15 0925  NA 136 134* 138 136  --  134*  K 5.1 5.3* 4.2 4.7  --  4.8  CL 107 105 111 112*  --  115*  CO2 20* 18* 19* 20*  --  15*  GLUCOSE 121* 214* 59* 83  --  130*  BUN 54* 57* 52* 49*  --  34*  CREATININE 3.33* 4.02* 3.71* 3.48*  3.53* 3.12* 2.78*  CALCIUM 8.1* 7.5* 7.5* 7.7*  --  7.5*    Liver Function Tests: No results for input(s): AST, ALT, ALKPHOS, BILITOT, PROT, ALBUMIN in the last 168 hours. No results for input(s): LIPASE, AMYLASE in the last 168 hours. No results for  input(s): AMMONIA in the last 168 hours.  CBC:  Recent Labs Lab 08/30/15 2024 09/01/15 2052 09/02/15 0631 09/05/15 0855  WBC 10.3 8.9 7.3 6.5  NEUTROABS 8.3* 7.7*  --   --   HGB 9.9* 9.6* 8.6* 9.3*  HCT 29.2* 27.7* 25.3* 28.1*  MCV 95.5 95.3 95.6 97.0  PLT 120* 165 158 201    Cardiac Enzymes: No results for input(s): CKTOTAL, CKMB, CKMBINDEX, TROPONINI in the last 168 hours.  BNP: Invalid input(s): POCBNP  CBG:  Recent Labs Lab 09/04/15 1152 09/04/15 1610 09/04/15 2119 09/05/15 0751 09/05/15 1213  GLUCAP 84 85 126* 82 105*    Microbiology: Results for orders placed or performed during the hospital encounter of 09/01/15  Culture, blood (Routine X 2) w Reflex to ID Panel     Status: None (Preliminary result)   Collection Time: 09/01/15  8:52 PM  Result Value Ref Range Status   Specimen Description BLOOD LEFT WRIST  Final   Special Requests   Final    BOTTLES DRAWN AEROBIC AND ANAEROBIC 11CCAERO,9CCANA   Culture NO GROWTH 4 DAYS  Final   Report Status PENDING  Incomplete  Culture, blood (Routine X 2) w Reflex to ID Panel     Status: None (Preliminary result)   Collection Time: 09/01/15  8:53 PM  Result Value Ref Range Status   Specimen Description BLOOD RIGHT WRIST  Final   Special Requests BOTTLES DRAWN AEROBIC AND ANAEROBIC [place]  Final   Culture NO GROWTH 4 DAYS  Final   Report Status PENDING  Incomplete  CULTURE, BLOOD (ROUTINE X 2) w Reflex to PCR ID Panel     Status: None (Preliminary result)   Collection Time: 09/03/15  8:49 AM  Result Value Ref Range Status   Specimen Description BLOOD RIGHT ARM  Final   Special Requests   Final    BOTTLES DRAWN AEROBIC AND ANAEROBIC  AEROBIC 3CC, ANAEROBIC 1CC   Culture NO GROWTH 2 DAYS  Final   Report Status PENDING  Incomplete  CULTURE, BLOOD (ROUTINE X 2) w Reflex to PCR ID Panel     Status: None (Preliminary result)   Collection Time: 09/03/15  8:53 AM  Result Value Ref Range Status   Specimen  Description BLOOD LEFT HAND  Final   Special Requests BOTTLES DRAWN AEROBIC AND ANAEROBIC  0.5CC  Final   Culture NO GROWTH 2 DAYS  Final   Report Status PENDING  Incomplete    Coagulation Studies: No results for input(s): LABPROT, INR in the last 72 hours.  Urinalysis: No results for input(s): COLORURINE, LABSPEC, PHURINE, GLUCOSEU, HGBUR, BILIRUBINUR, KETONESUR, PROTEINUR, UROBILINOGEN, NITRITE, LEUKOCYTESUR in the last 72 hours.  Invalid input(s): APPERANCEUR    Imaging: Dg Chest 1 View  09/04/2015  CLINICAL DATA:  Status post PICC line placement EXAM: CHEST 1 VIEW COMPARISON:  09/01/2015 FINDINGS: Cardiac shadow is stable. The lungs are well aerated bilaterally. A new right jugular PICC line is noted with the catheter tip in the distal superior vena cava. No pneumothorax is noted. No bony abnormality is seen. IMPRESSION: PICC line in satisfactory position. No other focal abnormality is noted. Electronically Signed   By: Inez Catalina M.D.   On: 09/04/2015 17:26   US Venous Img Upper Uni Right  09/03/2015  CLINICAL DATA:  Right upper extremity edema following PICC line infection with subsequent removal. Evaluate for DVT. EXAM: RIGHT UPPER EXTREMITY VENOUS DOPPLER ULTRASOUND TECHNIQUE: Gray-scale sonography with graded compression, as well as color Doppler and duplex ultrasound were performed to evaluate the upper extremity deep venous system from the level of the subclavian vein and including the jugular, axillary, basilic, radial, ulnar and upper cephalic vein. Spectral Doppler was utilized to evaluate flow at rest and with distal augmentation maneuvers. COMPARISON:  None. FINDINGS: Contralateral Subclavian Vein: Respiratory phasicity is normal and symmetric with the symptomatic side. No evidence of thrombus. Normal compressibility. Internal Jugular Vein: No evidence of thrombus. Normal compressibility, respiratory phasicity and response to augmentation. Subclavian Vein: No evidence of  thrombus. Normal compressibility, respiratory phasicity and response to augmentation. Axillary Vein: No evidence of thrombus. Normal compressibility, respiratory phasicity and response to augmentation. Cephalic Vein: No evidence of thrombus. Normal compressibility, respiratory phasicity and response to augmentation. Basilic Vein: There is hypoechoic nonocclusive thrombus within the right basilic vein at the level of the shoulder (representative image 38). Brachial Veins: There is hypoechoic occlusive slightly expansile thrombus within both of the paired right brachial veins (representative image 34). Radial Veins: No evidence of thrombus. Normal compressibility, respiratory phasicity and response to augmentation. Ulnar Veins: No evidence of thrombus. Normal compressibility, respiratory phasicity and response to augmentation. Venous Reflux:  None visualized. Other Findings: No definitive cystic or solid lesions correlate with the patient's area of swelling within the upper arm (representative images 43 and 44). IMPRESSION: 1. Examination  is positive for occlusive DVT within both paired right brachial veins. There is no definitive extension of this DVT to the more central aspect of the right upper extremity venous system. 2. Examination is positive for nonocclusive thrombus within the right basilic vein at the level the shoulder. These results will be called to the ordering clinician or representative by the Radiologist Assistant, and communication documented in the PACS or zVision Dashboard. Electronically Signed   By: Sandi Mariscal M.D.   On: 09/03/2015 15:27     Medications:   . sodium chloride 50 mL/hr at 09/05/15 0446   . amLODipine  5 mg Oral Daily  . calcitRIOL  0.25 mcg Oral Daily  . cholecalciferol  1,000 Units Oral Daily  . ferrous sulfate  325 mg Oral BID WC  . heparin  5,000 Units Subcutaneous 3 times per day  . insulin aspart  0-15 Units Subcutaneous TID WC  . insulin glargine  5 Units  Subcutaneous Daily  . levothyroxine  125 mcg Oral QAC breakfast  . sodium bicarbonate  650 mg Oral Daily  . vancomycin  1,000 mg Intravenous Q36H   acetaminophen, polyethylene glycol  Assessment/ Plan:  82 y.o. 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   1. Chronic kidney disease stage IV/congenitally absent right kidney/proteinuria/ARF due to MRSA sepsis 2. Hyperkalemia 3. Diabetes with CKD 4. MRSA sepsis due to infected PICC. 5.  ITP treated with prednisone and rituximab.  Plan: Renal function continues to improve.  Creatinine down to 2.7. Patient becoming acidotic likely do to normal saline.  Therefore we will stop 0.9 normal saline infusion.  Hyperkalemia has also resolved with a potassium of 4.8.  Platelet count is excellent at 201,000. Patient has been treated with rituximab as an outpatient.  He has a new right IJ PICC in place for treatment with vancomycin.  He will continue to follow with me as an outpatient for his underlying chronic kidney disease.  LOS: [place] 3/22/20173:05 PM

## 2015-09-05 NOTE — Care Management (Signed)
Spoke with patient and his wife about home IV antibiotic therapy.  Discussed the administration responsibilities and wife says even though she does not want to learn and administer the medication, she will learn.  She has previously been flushing a PICC.  Presented list of infusion agencies and agency preference is [facility].  Referral called to [facility] and discussed current administration frequency is q 36 hours.  Discussed with attending that care management needed to know th administration frequency ASAP

## 2015-09-06 ENCOUNTER — Inpatient Hospital Stay: Payer: Medicare HMO | Admitting: Oncology

## 2015-09-06 ENCOUNTER — Inpatient Hospital Stay: Payer: Medicare HMO

## 2015-09-06 LAB — CREATININE, SERUM
Creatinine, Ser: 2.6 mg/dL — ABNORMAL HIGH (ref 0.61–1.24)
GFR calc Af Amer: 25 mL/min — ABNORMAL LOW (ref >60)
GFR calc non Af Amer: 21 mL/min — ABNORMAL LOW (ref >60)

## 2015-09-06 LAB — CULTURE, BLOOD (ROUTINE X 2)
CULTURE: NO GROWTH
Culture: NO GROWTH

## 2015-09-06 LAB — GLUCOSE, CAPILLARY
GLUCOSE-CAPILLARY: 89 mg/dL (ref 65–99)
Glucose-Capillary: 104 mg/dL — ABNORMAL HIGH (ref 65–99)

## 2015-09-06 LAB — VANCOMYCIN, TROUGH: Vancomycin Tr: 18 ug/mL (ref 10–20)

## 2015-09-06 MED ORDER — AMLODIPINE BESYLATE 5 MG PO TABS: 5.0000 mg | ORAL_TABLET | Freq: Every day | ORAL | Status: DC

## 2015-09-06 MED ORDER — ENOXAPARIN SODIUM 40 MG/0.4ML ~~LOC~~ SOLN: 80.0000 mg | SUBCUTANEOUS | Status: DC

## 2015-09-06 MED ORDER — WARFARIN SODIUM 3 MG PO TABS: 3.0000 mg | ORAL_TABLET | Freq: Every day | ORAL | Status: DC

## 2015-09-06 NOTE — Progress Notes (Signed)
[facility]  ROUNDING NOTE   Subjective:  No new renal function testing this a.m. Creatinine was down to 2.7 which is close to his baseline yesterday. Good urine output noted.  Objective:  Vital signs in last 24 hours:  Temp:  [98.1 F (36.7 C)-98.4 F (36.9 C)] 98.1 F (36.7 C) (03/23 0851) Pulse Rate:  [58-64] 58 (03/23 0851) Resp:  [17-20] 20 (03/23 0851) BP: (138-167)/(64-79) 157/75 mmHg (03/23 0851) SpO2:  [93 %-99 %] 99 % (03/23 0851) Weight:  [80.604 kg (177 lb 11.2 oz)] 80.604 kg (177 lb 11.2 oz) (03/23 0529)  Weight change: -2.132 kg (-4 lb 11.2 oz) Filed Weights   09/04/15 0412 09/05/15 0357 09/06/15 0529  Weight: 83.144 kg (183 lb 4.8 oz) 82.736 kg (182 lb 6.4 oz) 80.604 kg (177 lb 11.2 oz)    Intake/Output: I/O last 3 completed shifts: In: 2412.2 [I.V.:2412.2] Out: 2000 [Urine:2000]   Intake/Output this shift:     Physical Exam: General: NAD, resting in bed.  Head: Normocephalic, atraumatic. Moist oral mucosal membranes  Eyes: Anicteric  Neck: Supple, trachea midline, IJ PICC  Lungs:  Clear to auscultation, normal effort  Heart: Regular rate and rhythm no rubs  Abdomen:  Soft, nontender, BS present  Extremities: 1+ peripheral edema  Neurologic: Nonfocal, moving all four extremities  Skin: No lesions       Basic Metabolic Panel:  Recent Labs Lab 08/30/15 2024 09/01/15 2052 09/02/15 0631 09/03/15 0843 09/04/15 0405 09/05/15 0925  NA 136 134* 138 136  --  134*  K 5.1 5.3* 4.2 4.7  --  4.8  CL 107 105 111 112*  --  115*  CO2 20* 18* 19* 20*  --  15*  GLUCOSE 121* 214* 59* 83  --  130*  BUN 54* 57* 52* 49*  --  34*  CREATININE 3.33* 4.02* 3.71* 3.48*  3.53* 3.12* 2.78*  CALCIUM 8.1* 7.5* 7.5* 7.7*  --  7.5*    Liver Function Tests: No results for input(s): AST, ALT, ALKPHOS, BILITOT, PROT, ALBUMIN in the last 168 hours. No results for input(s): LIPASE, AMYLASE in the last 168 hours. No results for input(s): AMMONIA in the  last 168 hours.  CBC:  Recent Labs Lab 08/30/15 2024 09/01/15 2052 09/02/15 0631 09/05/15 0855  WBC 10.3 8.9 7.3 6.5  NEUTROABS 8.3* 7.7*  --   --   HGB 9.9* 9.6* 8.6* 9.3*  HCT 29.2* 27.7* 25.3* 28.1*  MCV 95.5 95.3 95.6 97.0  PLT 120* 165 158 201    Cardiac Enzymes: No results for input(s): CKTOTAL, CKMB, CKMBINDEX, TROPONINI in the last 168 hours.  BNP: Invalid input(s): POCBNP  CBG:  Recent Labs Lab 09/05/15 0751 09/05/15 1213 09/05/15 1619 09/05/15 2122 09/06/15 0748  GLUCAP 82 105* 116* 126* 60    Microbiology: Results for orders placed or performed during the hospital encounter of 09/01/15  Culture, blood (Routine X 2) w Reflex to ID Panel     Status: None (Preliminary result)   Collection Time: 09/01/15  8:52 PM  Result Value Ref Range Status   Specimen Description BLOOD LEFT WRIST  Final   Special Requests   Final    BOTTLES DRAWN AEROBIC AND ANAEROBIC 11CCAERO,9CCANA   Culture NO GROWTH 4 DAYS  Final   Report Status PENDING  Incomplete  Culture, blood (Routine X 2) w Reflex to ID Panel     Status: None (Preliminary result)   Collection Time: 09/01/15  8:53 PM  Result Value Ref Range Status  Specimen Description BLOOD RIGHT WRIST  Final   Special Requests BOTTLES DRAWN AEROBIC AND ANAEROBIC [place]  Final   Culture NO GROWTH 4 DAYS  Final   Report Status PENDING  Incomplete  CULTURE, BLOOD (ROUTINE X 2) w Reflex to PCR ID Panel     Status: None (Preliminary result)   Collection Time: 09/03/15  8:49 AM  Result Value Ref Range Status   Specimen Description BLOOD RIGHT ARM  Final   Special Requests   Final    BOTTLES DRAWN AEROBIC AND ANAEROBIC  AEROBIC 3CC, ANAEROBIC 1CC   Culture NO GROWTH 2 DAYS  Final   Report Status PENDING  Incomplete  CULTURE, BLOOD (ROUTINE X 2) w Reflex to PCR ID Panel     Status: None (Preliminary result)   Collection Time: 09/03/15  8:53 AM  Result Value Ref Range Status   Specimen Description BLOOD LEFT HAND   Final   Special Requests BOTTLES DRAWN AEROBIC AND ANAEROBIC  0.5CC  Final   Culture NO GROWTH 2 DAYS  Final   Report Status PENDING  Incomplete    Coagulation Studies: No results for input(s): LABPROT, INR in the last 72 hours.  Urinalysis: No results for input(s): COLORURINE, LABSPEC, PHURINE, GLUCOSEU, HGBUR, BILIRUBINUR, KETONESUR, PROTEINUR, UROBILINOGEN, NITRITE, LEUKOCYTESUR in the last 72 hours.  Invalid input(s): APPERANCEUR    Imaging: Dg Chest 1 View  09/04/2015  CLINICAL DATA:  Status post PICC line placement EXAM: CHEST 1 VIEW COMPARISON:  09/01/2015 FINDINGS: Cardiac shadow is stable. The lungs are well aerated bilaterally. A new right jugular PICC line is noted with the catheter tip in the distal superior vena cava. No pneumothorax is noted. No bony abnormality is seen. IMPRESSION: PICC line in satisfactory position. No other focal abnormality is noted. Electronically Signed   By: Inez Catalina M.D.   On: 09/04/2015 17:26     Medications:   . sodium chloride 50 mL/hr at 09/05/15 2212   . amLODipine  5 mg Oral Daily  . calcitRIOL  0.25 mcg Oral Daily  . cholecalciferol  1,000 Units Oral Daily  . ferrous sulfate  325 mg Oral BID WC  . heparin  5,000 Units Subcutaneous 3 times per day  . insulin aspart  0-15 Units Subcutaneous TID WC  . insulin glargine  5 Units Subcutaneous Daily  . levothyroxine  125 mcg Oral QAC breakfast  . sodium bicarbonate  650 mg Oral Daily  . vancomycin  1,000 mg Intravenous Q36H   acetaminophen, polyethylene glycol, sodium chloride flush  Assessment/ Plan:  82 y.o. 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   1. Chronic kidney disease stage IV/congenitally absent  right kidney/proteinuria/ARF due to MRSA sepsis 2. Hyperkalemia 3. Diabetes with CKD 4. MRSA sepsis due to infected PICC. 5.  ITP treated with prednisone and rituximab.  Plan: Patient doing well at the moment. His renal function has improved over the course of the hospitalization.  Hyperkalemia was also corrected with most recent potassium of 4.8. His MRSA sepsis is being treated with vancomycin 1000 mg IV every 36 hours and is being administered via a new right IJ PICC. His thrombocytopenia has actually significantly improved with most recent platelet count of 201. He has been treated with rituximab for this and has followed with Dr. Grayland Ormond. Okay for discharge from our  perspective.  LOS: 5 Jacque [place] 3/23/20178:54 AM

## 2015-09-06 NOTE — Care Management (Signed)
Have contacted [facility] about anticipated discharge date and anticipated discharge date and plans

## 2015-09-06 NOTE — Progress Notes (Signed)
Pharmacy Antibiotic Note   Alejandro Armstrong is a 82 y.o. male admitted on 09/01/2015 with bacteremia/sepsis with MRSA, also wound cx+ MRSA  Both from ER visit 3/16.  Pharmacy has been consulted for Vancomycin dosing.  Plan: Goal Trough 15-20 mcg/ml  Patient Received Vancomycin 1 gram on 3/18 at 2106. Vancomcyin 1 gram IV q36hrs ordered with next dose on MAR for 2100 tonight 3/20. (Stacked dose not given?).  Trough 3/23 at 1050 = 18 mcg/ml, within goal range. Will continue Vancomycin 1000mg  IV Q36h at this time.   Height: 5\' 7"  (170.2 cm) Weight: 177 lb 11.2 oz (80.604 kg) IBW/kg (Calculated) : 66.1  Temp (24hrs), Avg:98.2 F (36.8 C), Min:98.1 F (36.7 C), Max:98.4 F (36.9 C)   Recent Labs Lab 08/30/15 2024 09/01/15 2052 09/02/15 0631 09/03/15 0843 09/04/15 0405 09/05/15 0855 09/05/15 0925 09/06/15 1050  WBC 10.3 8.9 7.3  --   --  6.5  --   --   CREATININE 3.33* 4.02* 3.71* 3.48*  3.53* 3.12*  --  2.78*  --   VANCOTROUGH  --   --   --   --   --   --   --  18  VANCORANDOM  --   --   --  11  --   --   --   --     Estimated Creatinine Clearance: 20.5 mL/min (by C-G formula based on Cr of 2.78).    No Known Allergies  Antimicrobials this admission: Vancomycin 3/18 >>   >>  Dose adjustments this admission:   Microbiology results: 3/16 BCx: MRSA 3/18 Bcx NG final 3/20 Bcx NG x 3 days 3/16 Wound cx(PICC line site): MRSA   Thank you for allowing pharmacy to be a part of this patient's care.  Rocky Morel 09/06/2015 12:53 PM

## 2015-09-06 NOTE — Discharge Summary (Signed)
Alejandro Armstrong, is a 82 y.o. male  DOB [birth year]  MRN [id number].  Admission date:  09/01/2015  Admitting Physician  Idelle Crouch, MD  Discharge Date:  09/06/2015   Primary MD  Casilda Carls, MD  Recommendations for primary care physician for things to follow:   Follow-up with Dr. Ola Spurr in the 1 week, likely on April 2.   Admission Diagnosis  Bacteremia [R78.81] Hyperkalemia [E87.5] Acute on chronic renal insufficiency (HCC) [N28.9, N18.9]   Discharge Diagnosis  Bacteremia [R78.81] Hyperkalemia [E87.5] Acute on chronic renal insufficiency (HCC) [N28.9, N18.9]    Principal Problem:   MRSA bacteremia secondary to PICC line infection Active Problems:   ITP (idiopathic thrombocytopenic purpura)   Acute on chronic renal insufficiency (HCC)   Hyperkalemia   Type 2 diabetes mellitus ([place])      Past Medical History  Diagnosis Date  . Hyperlipidemia   . Hypertension   . Hypothyroidism   . Type 2 diabetes mellitus ([place])   . Renal agenesis     discovered at age 19  . Recurrent UTI   . Ureteral stricture   . Kidney stones     Past Surgical History  Procedure Laterality Date  . Kidney stone surgery         History of present illness and  Hospital Course:     Kindly see H&P for history of present illness and admission details, please review complete Labs, Consult reports and Test reports for all details in brief  HPI  from the history and physical done on the day of admission  82 year old patient with history of ITP, hyperlipidemia, hypertension, chronic kidney disease stage III comes in because of MRSA from blood cultures. She went to emergency room March 16 because of redness around  PICC .PICC line was placed because of Rituxan for ITP.  that time right upper extremity PICC line was removed,  catheter tip was sent for cultures, sterile bandage was  placed. Patient came to emergency room because blood cultures showed MRSA. He is admitted for IV antibiotics.  Hospital Course  #1 MRSA bacteremia.; And on admission secondary to PICC line infection: PICC line was removed in the emergency room on March 16. Patient was called in for Bactrim but the daughter discontinued and we started him on vancomycin. Repeat blood cultures from March 18 showed no growth. Patient is seen by Dr. Ola Spurr from [facility]. He recommended weeks of IV vancomycin from March 18. Stop date for IV vancomycin is April 2. Patient is getting vancomycin IV 1 g every 36 hours according to his creatinine clearance. Received new PICC line in the right IJ. Echo did  not show any vegetations.  2.right upper arm DVT where the PICC line was removed: Anticoagulation options discussed with the patient and also Dr. Grayland Ormond. Patient has history of ITP before. Dr. Grayland Ormond said that he eats okay to give anticoagulation. Unable to use her daughter liquids because of his poor renal function. She'll get Lovenox, Coumadin. Continue Lovenox until the INR is more than 2. Arranged home health for IV antibiotics. Patient needs Lovenox 1 MG per KG daily according to the creatinine clearance. I'll continue the Coumadin for DVT. Patient is to follow-up with Dr. Grayland Ormond in 1 week.  #3 chronic kidney disease stage IV: Congenitally absent right kidney: Acute renal failure secondary to MRSA sepsis: Patient creatinine is improving from 4-2.7. Patient can follow-up with Dr. Anthonette Legato as an outpatient. Patient received IV hydration for acute renal  failure.  #4 ITP patient has seen Dr. Grayland Ormond, treated with rituximab and prednisone. Platelets improved from as low as 5 to 200.  Discharge Condition: Stable   Follow UP Oncology Dr. Grayland Ormond in one week Follow-up with  Nephrology in 10 days.  With Dr. Ola Spurr in 10 days     Discharge  Instructions  and  Discharge Medications    Discharge Instructions    Face-to-face encounter (required for Medicare/Medicaid patients)    Complete by:  As directed   I [facility] certify that this patient is under my care and that I, or a nurse practitioner or physician's assistant working with me, had a face-to-face encounter that meets the physician face-to-face encounter requirements with this patient on 09/06/2015. The encounter with the patient was in whole, or in part for the following medical condition(s) which is the primary reason for home health care  MRSA bacteremia Right arm DVT h/o CKD stage 4  The encounter with the patient was in whole, or in part, for the following medical condition, which is the primary reason for home health care:  whole  I certify that, based on my findings, the following services are medically necessary home health services:  Nursing  Reason for Medically Necessary Home Health Services:  Skilled Nursing- Assessment and Training for Infusion Therapy, Line Care, and Infection Control  My clinical findings support the need for the above services:  OTHER SEE COMMENTS  Further, I certify that my clinical findings support that this patient is homebound due to:  Unsafe ambulation due to balance issues     Home Health    Complete by:  As directed   To provide the following care/treatments:  RN            Medication List    STOP taking these medications        clindamycin 300 MG capsule  Commonly known as:  CLEOCIN     sulfamethoxazole-trimethoprim 800-160 MG tablet  Commonly known as:  BACTRIM DS,SEPTRA DS      TAKE these medications        amLODipine 5 MG tablet  Commonly known as:  NORVASC  Take 5 mg by mouth.     calcitRIOL 0.25 MCG capsule  Commonly known as:  ROCALTROL  Take 0.25 mcg by mouth daily.     cholecalciferol 1000 units tablet  Commonly known as:  VITAMIN D  Take 1,000 Units by mouth daily.     enoxaparin 40 MG/0.4ML  injection  Commonly known as:  LOVENOX  Inject 0.8 mLs (80 mg total) into the skin daily.     ferrous sulfate 325 (65 FE) MG tablet  Take 325 mg by mouth.     insulin glargine 100 UNIT/ML injection  Commonly known as:  LANTUS  Inject 0.1 mLs (10 Units total) into the skin at bedtime.     levothyroxine 125 MCG tablet  Commonly known as:  SYNTHROID, LEVOTHROID     lisinopril 20 MG tablet  Commonly known as:  PRINIVIL,ZESTRIL  Take 20 mg by mouth.     nystatin 100000 UNIT/ML suspension  Commonly known as:  MYCOSTATIN  Take 5 mLs (500,000 Units total) by mouth 4 (four) times daily.     Omega-3 1000 MG Caps  Take by mouth.     polyethylene glycol packet  Commonly known as:  MIRALAX / GLYCOLAX  Take 17 g by mouth daily as needed for mild constipation.     sodium bicarbonate 650 MG tablet  warfarin 3 MG tablet  Commonly known as:  COUMADIN  Take 1 tablet (3 mg total) by mouth daily.          Diet and Activity recommendation: See Discharge Instructions above   Consults obtained - nephrology, ID, oncology   Major procedures and Radiology Reports - PLEASE review detailed and final reports for all details, in brief -     Dg Chest 1 View  09/04/2015  CLINICAL DATA:  Status post PICC line placement EXAM: CHEST 1 VIEW COMPARISON:  09/01/2015 FINDINGS: Cardiac shadow is stable. The lungs are well aerated bilaterally. A new right jugular PICC line is noted with the catheter tip in the distal superior vena cava. No pneumothorax is noted. No bony abnormality is seen. IMPRESSION: PICC line in satisfactory position. No other focal abnormality is noted. Electronically Signed   By: Inez Catalina M.D.   On: 09/04/2015 17:26   US Renal  09/02/2015  CLINICAL DATA:  Renal failure EXAM: RENAL / URINARY TRACT ULTRASOUND COMPLETE COMPARISON:  CT 08/06/2015 FINDINGS: Right Kidney: Absent. Left Kidney: Length: 16.3 cm. Echogenic, with parenchymal thinning consistent with atrophy. Lower pole  cortical calcifications are present, similar to the appearances on CT. No conclusive collecting system calculi. No hydronephrosis per Bladder: Appears normal for degree of bladder distention. Left ureteral jet observed. IMPRESSION: Absent right kidney. Left kidney appears atrophic with echogenic parenchyma consistent with medical renal disease. No hydronephrosis. Left ureteral jet documented at the urinary bladder. Electronically Signed   By: Andreas Newport M.D.   On: 09/02/2015 01:54   US Venous Img Lower Bilateral  08/17/2015  CLINICAL DATA:  82 year old male with a history of swelling bilateral lower extremities EXAM: BILATERAL LOWER EXTREMITY VENOUS DOPPLER ULTRASOUND TECHNIQUE: Gray-scale sonography with graded compression, as well as color Doppler and duplex ultrasound were performed to evaluate the lower extremity deep venous systems from the level of the common femoral vein and including the common femoral, femoral, profunda femoral, popliteal and calf veins including the posterior tibial, peroneal and gastrocnemius veins when visible. The superficial great saphenous vein was also interrogated. Spectral Doppler was utilized to evaluate flow at rest and with distal augmentation maneuvers in the common femoral, femoral and popliteal veins. COMPARISON:  None. FINDINGS: RIGHT LOWER EXTREMITY Common Femoral Vein: No evidence of thrombus. Normal compressibility, respiratory phasicity and response to augmentation. Saphenofemoral Junction: No evidence of thrombus. Normal compressibility and flow on color Doppler imaging. Profunda Femoral Vein: No evidence of thrombus. Normal compressibility and flow on color Doppler imaging. Femoral Vein: No evidence of thrombus. Normal compressibility, respiratory phasicity and response to augmentation. Popliteal Vein: No evidence of thrombus. Normal compressibility, respiratory phasicity and response to augmentation. Calf Veins: No evidence of thrombus. Normal compressibility  and flow on color Doppler imaging. Superficial Great Saphenous Vein: No evidence of thrombus. Normal compressibility and flow on color Doppler imaging. Other Findings: Anechoic lentiform structure in the right popliteal fossa measuring 4.1 cm x 1.5 cm x 2.9 cm. No internal flow. LEFT LOWER EXTREMITY Common Femoral Vein: No evidence of thrombus. Normal compressibility, respiratory phasicity and response to augmentation. Saphenofemoral Junction: No evidence of thrombus. Normal compressibility and flow on color Doppler imaging. Profunda Femoral Vein: No evidence of thrombus. Normal compressibility and flow on color Doppler imaging. Femoral Vein: No evidence of thrombus. Normal compressibility, respiratory phasicity and response to augmentation. Popliteal Vein: No evidence of thrombus. Normal compressibility, respiratory phasicity and response to augmentation. Calf Veins: No evidence of thrombus. Normal compressibility and flow on color  Doppler imaging. Superficial Great Saphenous Vein: No evidence of thrombus. Normal compressibility and flow on color Doppler imaging. Other Findings:  None. IMPRESSION: Sonographic survey of the bilateral lower extremities negative for DVT. Likely Baker cyst in the right popliteal fossa. Signed, Dulcy Fanny. Earleen Newport, DO [facility] Electronically Signed   By: Corrie Mckusick D.O.   On: 08/17/2015 15:45   US Venous Img Upper Uni Right  09/03/2015  CLINICAL DATA:  Right upper extremity edema following PICC line infection with subsequent removal. Evaluate for DVT. EXAM: RIGHT UPPER EXTREMITY VENOUS DOPPLER ULTRASOUND TECHNIQUE: Gray-scale sonography with graded compression, as well as color Doppler and duplex ultrasound were performed to evaluate the upper extremity deep venous system from the level of the subclavian vein and including the jugular, axillary, basilic, radial, ulnar and upper cephalic vein. Spectral Doppler was utilized  to evaluate flow at rest and with distal augmentation maneuvers. COMPARISON:  None. FINDINGS: Contralateral Subclavian Vein: Respiratory phasicity is normal and symmetric with the symptomatic side. No evidence of thrombus. Normal compressibility. Internal Jugular Vein: No evidence of thrombus. Normal compressibility, respiratory phasicity and response to augmentation. Subclavian Vein: No evidence of thrombus. Normal compressibility, respiratory phasicity and response to augmentation. Axillary Vein: No evidence of thrombus. Normal compressibility, respiratory phasicity and response to augmentation. Cephalic Vein: No evidence of thrombus. Normal compressibility, respiratory phasicity and response to augmentation. Basilic Vein: There is hypoechoic nonocclusive thrombus within the right basilic vein at the level of the shoulder (representative image 38). Brachial Veins: There is hypoechoic occlusive slightly expansile thrombus within both of the paired right brachial veins (representative image 34). Radial Veins: No evidence of thrombus. Normal compressibility, respiratory phasicity and response to augmentation. Ulnar Veins: No evidence of thrombus. Normal compressibility, respiratory phasicity and response to augmentation. Venous Reflux:  None visualized. Other Findings: No definitive cystic or solid lesions correlate with the patient's area of swelling within the upper arm (representative images 43 and 44). IMPRESSION: 1. Examination is positive for occlusive DVT within both paired right brachial veins. There is no definitive extension of this DVT to the more central aspect of the right upper extremity venous system. 2. Examination is positive for nonocclusive thrombus within the right basilic vein at the level the shoulder. These results will be called to the ordering clinician or representative by the Radiologist Assistant, and communication documented in the PACS or zVision Dashboard. Electronically Signed   By:  Sandi Mariscal M.D.   On: 09/03/2015 15:27   Dg Chest Port 1 View  09/01/2015  CLINICAL DATA:  Recent removal of the PICC line after developing erythema at the site. Now with developing weakness. EXAM: PORTABLE CHEST 1 VIEW COMPARISON:  08/03/2015 FINDINGS: Mildly prominent right hilar contours are likely vascular, accentuated due to the AP portable acquisition. The lungs are clear. Pulmonary vasculature is normal. There is no large effusion. Heart size is upper normal, unchanged. IMPRESSION: No acute cardiopulmonary findings. Electronically Signed   By: Andreas Newport M.D.   On: 09/01/2015 21:13    Micro Results    Recent Results (from the past 240 hour(s))  Blood culture (routine x 2)     Status: None   Collection Time: 08/30/15  8:20 PM  Result Value Ref Range Status   Specimen Description BLOOD LEFT HAND  Final   Special Requests BOTTLES DRAWN AEROBIC AND ANAEROBIC 5ML  Final   Culture  Setup Time   Final    GRAM POSITIVE COCCI IN BOTH AEROBIC AND  ANAEROBIC BOTTLES PREVIOUSLY CALLED AT 1459 08/31/15 BY [facility].    Culture   Final    METHICILLIN RESISTANT STAPHYLOCOCCUS AUREUS IN BOTH AEROBIC AND ANAEROBIC BOTTLES    Report Status 09/02/2015 FINAL  Final   Organism ID, Bacteria METHICILLIN RESISTANT STAPHYLOCOCCUS AUREUS  Final      Susceptibility   Methicillin resistant staphylococcus aureus - MIC*    CIPROFLOXACIN >=8 RESISTANT Resistant     ERYTHROMYCIN >=8 RESISTANT Resistant     GENTAMICIN <=0.5 SENSITIVE Sensitive     OXACILLIN >=4 RESISTANT Resistant     TETRACYCLINE <=1 SENSITIVE Sensitive     VANCOMYCIN <=0.5 SENSITIVE Sensitive     TRIMETH/SULFA <=10 SENSITIVE Sensitive     CLINDAMYCIN <=0.25 SENSITIVE Sensitive     RIFAMPIN <=0.5 SENSITIVE Sensitive     Inducible Clindamycin NEGATIVE Sensitive     * METHICILLIN RESISTANT STAPHYLOCOCCUS AUREUS  Blood culture (routine x 2)     Status: None   Collection Time: 08/30/15  8:45 PM  Result Value Ref Range Status    Specimen Description BLOOD RIGHT HAND  Final   Special Requests BOTTLES DRAWN AEROBIC AND ANAEROBIC 5ML  Final   Culture  Setup Time   Final    GRAM POSITIVE COCCI IN BOTH AEROBIC AND ANAEROBIC BOTTLES CRITICAL RESULT CALLED TO, READ BACK BY AND VERIFIED WITH: MELISSA Seward @ [id number] 08/31/15 BY [place]    Culture   Final    METHICILLIN RESISTANT STAPHYLOCOCCUS AUREUS IN BOTH AEROBIC AND ANAEROBIC BOTTLES    Report Status 09/02/2015 FINAL  Final   Organism ID, Bacteria METHICILLIN RESISTANT STAPHYLOCOCCUS AUREUS  Final      Susceptibility   Methicillin resistant staphylococcus aureus - MIC*    CIPROFLOXACIN >=8 RESISTANT Resistant     ERYTHROMYCIN >=8 RESISTANT Resistant     GENTAMICIN <=0.5 SENSITIVE Sensitive     OXACILLIN >=4 RESISTANT Resistant     TETRACYCLINE <=1 SENSITIVE Sensitive     VANCOMYCIN 1 SENSITIVE Sensitive     TRIMETH/SULFA <=10 SENSITIVE Sensitive     CLINDAMYCIN <=0.25 SENSITIVE Sensitive     RIFAMPIN <=0.5 SENSITIVE Sensitive     Inducible Clindamycin NEGATIVE Sensitive     * METHICILLIN RESISTANT STAPHYLOCOCCUS AUREUS  Blood Culture ID Panel (Reflexed)     Status: Abnormal   Collection Time: 08/30/15  8:45 PM  Result Value Ref Range Status   Enterococcus species NOT DETECTED NOT DETECTED Final   Vancomycin resistance NOT DETECTED NOT DETECTED Final   Listeria monocytogenes NOT DETECTED NOT DETECTED Final   Staphylococcus species DETECTED (A) NOT DETECTED Final    Comment: CRITICAL RESULT CALLED TO, READ BACK BY AND VERIFIED WITH: Marcelle Overlie @ [id number] 08/31/15 by [place]    Staphylococcus aureus DETECTED (A) NOT DETECTED Final    Comment: CRITICAL RESULT CALLED TO, READ BACK BY AND VERIFIED WITH: Marcelle Overlie @ [id number] 08/31/15 by [facility]    Methicillin resistance DETECTED (A) NOT DETECTED Final    Comment: CRITICAL RESULT CALLED TO, READ BACK BY AND VERIFIED WITH: Marcelle Overlie @ [id number] 08/31/15 by [place]    Streptococcus species NOT DETECTED NOT DETECTED Final    Streptococcus agalactiae NOT DETECTED NOT DETECTED Final   Streptococcus pneumoniae NOT DETECTED NOT DETECTED Final   Streptococcus pyogenes NOT DETECTED NOT DETECTED Final   Acinetobacter baumannii NOT DETECTED NOT DETECTED Final   Enterobacteriaceae species NOT DETECTED NOT DETECTED Final   Enterobacter cloacae complex NOT DETECTED NOT DETECTED Final   Escherichia coli NOT DETECTED NOT DETECTED Final  Klebsiella oxytoca NOT DETECTED NOT DETECTED Final   Klebsiella pneumoniae NOT DETECTED NOT DETECTED Final   Proteus species NOT DETECTED NOT DETECTED Final   Serratia marcescens NOT DETECTED NOT DETECTED Final   Carbapenem resistance NOT DETECTED NOT DETECTED Final   Haemophilus influenzae NOT DETECTED NOT DETECTED Final   Neisseria meningitidis NOT DETECTED NOT DETECTED Final   Pseudomonas aeruginosa NOT DETECTED NOT DETECTED Final   Candida albicans NOT DETECTED NOT DETECTED Final   Candida glabrata NOT DETECTED NOT DETECTED Final   Candida krusei NOT DETECTED NOT DETECTED Final   Candida parapsilosis NOT DETECTED NOT DETECTED Final   Candida tropicalis NOT DETECTED NOT DETECTED Final  Culture, blood (Routine X 2) w Reflex to ID Panel     Status: None   Collection Time: 08/30/15 10:31 PM  Result Value Ref Range Status   Specimen Description BLOOD RIGHT ARM  Final   Special Requests BOTTLES DRAWN AEROBIC AND ANAEROBIC 5ML  Final   Culture  Setup Time   Final    GRAM POSITIVE COCCI IN BOTH AEROBIC AND ANAEROBIC BOTTLES CRITICAL VALUE NOTED.  VALUE IS CONSISTENT WITH PREVIOUSLY REPORTED AND CALLED VALUE.    Culture   Final    METHICILLIN RESISTANT STAPHYLOCOCCUS AUREUS IN BOTH AEROBIC AND ANAEROBIC BOTTLES    Report Status 09/02/2015 FINAL  Final   Organism ID, Bacteria METHICILLIN RESISTANT STAPHYLOCOCCUS AUREUS  Final      Susceptibility   Methicillin resistant staphylococcus aureus - MIC*    CIPROFLOXACIN >=8 RESISTANT Resistant     ERYTHROMYCIN >=8 RESISTANT Resistant      GENTAMICIN <=0.5 SENSITIVE Sensitive     OXACILLIN >=4 RESISTANT Resistant     TETRACYCLINE <=1 SENSITIVE Sensitive     VANCOMYCIN <=0.5 SENSITIVE Sensitive     TRIMETH/SULFA <=10 SENSITIVE Sensitive     CLINDAMYCIN <=0.25 SENSITIVE Sensitive     RIFAMPIN <=0.5 SENSITIVE Sensitive     Inducible Clindamycin NEGATIVE Sensitive     * METHICILLIN RESISTANT STAPHYLOCOCCUS AUREUS  Wound culture     Status: None   Collection Time: 08/30/15 10:32 PM  Result Value Ref Range Status   Specimen Description WOUND  Final   Special Requests NONE  Final   Gram Stain FEW GRAM POSITIVE COCCI  Final   Culture   Final    MODERATE GROWTH METHICILLIN RESISTANT STAPHYLOCOCCUS AUREUS   Report Status 09/02/2015 FINAL  Final   Organism ID, Bacteria METHICILLIN RESISTANT STAPHYLOCOCCUS AUREUS  Final      Susceptibility   Methicillin resistant staphylococcus aureus - MIC*    CIPROFLOXACIN >=8 RESISTANT Resistant     ERYTHROMYCIN >=8 RESISTANT Resistant     GENTAMICIN <=0.5 SENSITIVE Sensitive     OXACILLIN >=4 RESISTANT Resistant     TETRACYCLINE <=1 SENSITIVE Sensitive     VANCOMYCIN 1 SENSITIVE Sensitive     TRIMETH/SULFA <=10 SENSITIVE Sensitive     CLINDAMYCIN <=0.25 SENSITIVE Sensitive     RIFAMPIN <=0.5 SENSITIVE Sensitive     Inducible Clindamycin NEGATIVE Sensitive     * MODERATE GROWTH METHICILLIN RESISTANT STAPHYLOCOCCUS AUREUS  Wound culture     Status: None   Collection Time: 08/30/15 10:56 PM  Result Value Ref Range Status   Specimen Description BLOOD  Final   Special Requests NONE  Final   Gram Stain MODERATE RED BLOOD CELLS NO ORGANISMS SEEN   Final   Culture METHICILLIN RESISTANT STAPHYLOCOCCUS AUREUS  Final   Report Status 09/02/2015 FINAL  Final   Organism ID, Bacteria METHICILLIN  RESISTANT STAPHYLOCOCCUS AUREUS  Final      Susceptibility   Methicillin resistant staphylococcus aureus - MIC*    CIPROFLOXACIN >=8 RESISTANT Resistant     ERYTHROMYCIN >=8 RESISTANT Resistant      GENTAMICIN <=0.5 SENSITIVE Sensitive     OXACILLIN >=4 RESISTANT Resistant     TETRACYCLINE <=1 SENSITIVE Sensitive     VANCOMYCIN 1 SENSITIVE Sensitive     TRIMETH/SULFA <=10 SENSITIVE Sensitive     CLINDAMYCIN <=0.25 SENSITIVE Sensitive     RIFAMPIN <=0.5 SENSITIVE Sensitive     Inducible Clindamycin NEGATIVE Sensitive     * METHICILLIN RESISTANT STAPHYLOCOCCUS AUREUS  Culture, blood (Routine X 2) w Reflex to ID Panel     Status: None   Collection Time: 09/01/15  8:52 PM  Result Value Ref Range Status   Specimen Description BLOOD LEFT WRIST  Final   Special Requests   Final    BOTTLES DRAWN AEROBIC AND ANAEROBIC 11CCAERO,9CCANA   Culture NO GROWTH 5 DAYS  Final   Report Status 09/06/2015 FINAL  Final  Culture, blood (Routine X 2) w Reflex to ID Panel     Status: None   Collection Time: 09/01/15  8:53 PM  Result Value Ref Range Status   Specimen Description BLOOD RIGHT WRIST  Final   Special Requests BOTTLES DRAWN AEROBIC AND ANAEROBIC [place]  Final   Culture NO GROWTH 5 DAYS  Final   Report Status 09/06/2015 FINAL  Final  CULTURE, BLOOD (ROUTINE X 2) w Reflex to PCR ID Panel     Status: None (Preliminary result)   Collection Time: 09/03/15  8:49 AM  Result Value Ref Range Status   Specimen Description BLOOD RIGHT ARM  Final   Special Requests   Final    BOTTLES DRAWN AEROBIC AND ANAEROBIC  AEROBIC 3CC, ANAEROBIC 1CC   Culture NO GROWTH 3 DAYS  Final   Report Status PENDING  Incomplete  CULTURE, BLOOD (ROUTINE X 2) w Reflex to PCR ID Panel     Status: None (Preliminary result)   Collection Time: 09/03/15  8:53 AM  Result Value Ref Range Status   Specimen Description BLOOD LEFT HAND  Final   Special Requests BOTTLES DRAWN AEROBIC AND ANAEROBIC  0.5CC  Final   Culture NO GROWTH 3 DAYS  Final   Report Status PENDING  Incomplete       Today   Subjective:   Alejandro Armstrong today has no headache,no chest abdominal pain,no new weakness tingling or numbness, feels  much better wants to go home today.  Objective:   Blood pressure 157/75, pulse 58, temperature 98.1 F (36.7 C), temperature source Oral, resp. rate 20, height 5\' 7"  (1.702 m), weight 80.604 kg (177 lb 11.2 oz), SpO2 99 %.   Intake/Output Summary (Last 24 hours) at 09/06/15 1121 Last data filed at 09/06/15 0700  Gross per 24 hour  Intake   1243 ml  Output   1350 ml  Net   -107 ml    Exam Awake Alert, Oriented x 3, No new F.N deficits, Normal affect [place].AT,PERRAL Supple Neck,No JVD, No cervical lymphadenopathy appriciated.  Symmetrical Chest wall movement, Good air movement bilaterally, CTAB RRR,No Gallops,Rubs or new Murmurs, No Parasternal Heave +ve B.Sounds, Abd Soft, Non tender, No organomegaly appriciated, No rebound -guarding or rigidity. No Cyanosis, Clubbing or edema, No new Rash or bruise  Data Review   CBC w Diff: Lab Results  Component Value Date   WBC 6.5 09/05/2015   WBC 6.5 10/15/2012  HGB 9.3* 09/05/2015   HGB 13.7 10/15/2012   HCT 28.1* 09/05/2015   HCT 39.8* 10/15/2012   PLT 201 09/05/2015   PLT 179 10/15/2012   LYMPHOPCT 8 09/01/2015   MONOPCT 5 09/01/2015   EOSPCT 0 09/01/2015   BASOPCT 0 09/01/2015    CMP: Lab Results  Component Value Date   NA 134* 09/05/2015   NA 137 10/15/2012   K 4.8 09/05/2015   K 4.2 10/15/2012   CL 115* 09/05/2015   CL 110* 10/15/2012   CO2 15* 09/05/2015   CO2 16* 10/15/2012   BUN 34* 09/05/2015   BUN 51* 10/15/2012   CREATININE 2.78* 09/05/2015   CREATININE 3.24* 10/15/2012   PROT 6.5 08/05/2015   PROT 8.7* 10/15/2012   ALBUMIN 3.5 08/05/2015   ALBUMIN 4.2 10/15/2012   BILITOT 0.9 08/05/2015   BILITOT 0.8 10/15/2012   ALKPHOS 121 08/05/2015   ALKPHOS 168* 10/15/2012   AST 27 08/05/2015   AST 43* 10/15/2012   ALT 25 08/05/2015   ALT 46 10/15/2012  .   Total Time in preparing paper work, data evaluation and todays exam - 17 minutes  Alejandro Armstrong M.D on 09/06/2015 at 11:21 AM    Note: This  dictation was prepared with Dragon dictation along with smaller phrase technology. Any transcriptional errors that result from this process are unintentional.

## 2015-09-06 NOTE — Care Management (Addendum)
Informed by ID that was contacted by [facility] and changed the dose frequency to q 48 hours.  Informed attending that needed home health order for SN PT Aide and SW for [facility]. Received call from [facility] regarding start of care visit.  It was asked if visit could be made on 3.25 as that is the day the first dose of IV antibiotic is due.  Informed caller that initial visit must be made 3.24 due to the anxiety of patient's wife.

## 2015-09-08 LAB — CULTURE, BLOOD (ROUTINE X 2)
CULTURE: NO GROWTH
Culture: NO GROWTH

## 2015-09-15 HISTORY — PX: PICC LINE REMOVAL (ARMC HX): HXRAD1261

## 2015-09-18 ENCOUNTER — Inpatient Hospital Stay
Admission: EM | Admit: 2015-09-18 | Discharge: 2015-09-21 | DRG: 194 | Disposition: A | Discharge status: Home Health | Payer: Medicare HMO | Attending: Internal Medicine | Admitting: Internal Medicine

## 2015-09-18 ENCOUNTER — Emergency Department: Payer: Medicare HMO

## 2015-09-18 ENCOUNTER — Encounter: Payer: Self-pay | Admitting: Emergency Medicine

## 2015-09-18 DIAGNOSIS — J209 Acute bronchitis, unspecified: Secondary | ICD-10-CM | POA: Diagnosis present

## 2015-09-18 DIAGNOSIS — E785 Hyperlipidemia, unspecified: Secondary | ICD-10-CM | POA: Diagnosis present

## 2015-09-18 DIAGNOSIS — Z8614 Personal history of Methicillin resistant Staphylococcus aureus infection: Secondary | ICD-10-CM

## 2015-09-18 DIAGNOSIS — J101 Influenza due to other identified influenza virus with other respiratory manifestations: Principal | ICD-10-CM | POA: Diagnosis present

## 2015-09-18 DIAGNOSIS — E039 Hypothyroidism, unspecified: Secondary | ICD-10-CM | POA: Diagnosis present

## 2015-09-18 DIAGNOSIS — Z86718 Personal history of other venous thrombosis and embolism: Secondary | ICD-10-CM

## 2015-09-18 DIAGNOSIS — R7881 Bacteremia: Secondary | ICD-10-CM | POA: Diagnosis present

## 2015-09-18 DIAGNOSIS — Z87898 Personal history of other specified conditions: Secondary | ICD-10-CM

## 2015-09-18 DIAGNOSIS — N184 Chronic kidney disease, stage 4 (severe): Secondary | ICD-10-CM | POA: Diagnosis present

## 2015-09-18 DIAGNOSIS — Z7901 Long term (current) use of anticoagulants: Secondary | ICD-10-CM | POA: Diagnosis not present

## 2015-09-18 DIAGNOSIS — D693 Immune thrombocytopenic purpura: Secondary | ICD-10-CM | POA: Diagnosis present

## 2015-09-18 DIAGNOSIS — N390 Urinary tract infection, site not specified: Secondary | ICD-10-CM | POA: Diagnosis present

## 2015-09-18 DIAGNOSIS — R509 Fever, unspecified: Secondary | ICD-10-CM | POA: Diagnosis present

## 2015-09-18 DIAGNOSIS — E1122 Type 2 diabetes mellitus with diabetic chronic kidney disease: Secondary | ICD-10-CM | POA: Diagnosis present

## 2015-09-18 DIAGNOSIS — I129 Hypertensive chronic kidney disease with stage 1 through stage 4 chronic kidney disease, or unspecified chronic kidney disease: Secondary | ICD-10-CM | POA: Diagnosis present

## 2015-09-18 DIAGNOSIS — Z95828 Presence of other vascular implants and grafts: Secondary | ICD-10-CM

## 2015-09-18 LAB — COMPREHENSIVE METABOLIC PANEL
ALT: 18 U/L (ref 17–63)
AST: 23 U/L (ref 15–41)
Albumin: 2.9 g/dL — ABNORMAL LOW (ref 3.5–5.0)
Alkaline Phosphatase: 105 U/L (ref 38–126)
Anion gap: 4 — ABNORMAL LOW (ref 5–15)
BUN: 36 mg/dL — AB (ref 6–20)
CHLORIDE: 113 mmol/L — AB (ref 101–111)
CO2: 19 mmol/L — ABNORMAL LOW (ref 22–32)
CREATININE: 3.06 mg/dL — AB (ref 0.61–1.24)
Calcium: 8.3 mg/dL — ABNORMAL LOW (ref 8.9–10.3)
GFR calc Af Amer: 20 mL/min — ABNORMAL LOW (ref >60)
GFR, EST NON AFRICAN AMERICAN: 17 mL/min — AB (ref >60)
Glucose, Bld: 133 mg/dL — ABNORMAL HIGH (ref 65–99)
Potassium: 4.8 mmol/L (ref 3.5–5.1)
Sodium: 136 mmol/L (ref 135–145)
Total Bilirubin: 0.4 mg/dL (ref 0.3–1.2)
Total Protein: 5.8 g/dL — ABNORMAL LOW (ref 6.5–8.1)

## 2015-09-18 LAB — CBC WITH DIFFERENTIAL/PLATELET
BASOS ABS: 0.1 10*3/uL (ref 0–0.1)
Basophils Relative: 1 %
EOS PCT: 1 %
Eosinophils Absolute: 0.1 10*3/uL (ref 0–0.7)
HEMATOCRIT: 27.8 % — AB (ref 40.0–52.0)
HEMOGLOBIN: 9.5 g/dL — AB (ref 13.0–18.0)
LYMPHS PCT: 33 %
Lymphs Abs: 1.6 10*3/uL (ref 1.0–3.6)
MCH: 31.7 pg (ref 26.0–34.0)
MCHC: 34.1 g/dL (ref 32.0–36.0)
MCV: 92.8 fL (ref 80.0–100.0)
Monocytes Absolute: 0.8 10*3/uL (ref 0.2–1.0)
Monocytes Relative: 17 %
NEUTROS ABS: 2.4 10*3/uL (ref 1.4–6.5)
NEUTROS PCT: 48 %
PLATELETS: 123 10*3/uL — AB (ref 150–440)
RBC: 3 MIL/uL — AB (ref 4.40–5.90)
RDW: 15.3 % — ABNORMAL HIGH (ref 11.5–14.5)
WBC: 5 10*3/uL (ref 3.8–10.6)

## 2015-09-18 LAB — URINALYSIS COMPLETE WITH MICROSCOPIC (ARMC ONLY)
BILIRUBIN URINE: NEGATIVE
Bacteria, UA: NONE SEEN
GLUCOSE, UA: 150 mg/dL — AB
HGB URINE DIPSTICK: NEGATIVE
Ketones, ur: NEGATIVE mg/dL
Nitrite: NEGATIVE
Protein, ur: >500 mg/dL — AB
SPECIFIC GRAVITY, URINE: 1.015 (ref 1.005–1.030)
Squamous Epithelial / LPF: NONE SEEN
pH: 7 (ref 5.0–8.0)

## 2015-09-18 LAB — PROTIME-INR
INR: 1.19
PROTHROMBIN TIME: 15.3 s — AB (ref 11.4–15.0)

## 2015-09-18 LAB — LACTIC ACID, PLASMA
LACTIC ACID, VENOUS: 1.6 mmol/L (ref 0.5–2.0)
Lactic Acid, Venous: 0.8 mmol/L (ref 0.5–2.0)

## 2015-09-18 LAB — GLUCOSE, CAPILLARY
Glucose-Capillary: 118 mg/dL — ABNORMAL HIGH (ref 65–99)
Glucose-Capillary: 112 mg/dL — ABNORMAL HIGH (ref 65–99)

## 2015-09-18 MED ORDER — WARFARIN - PHYSICIAN DOSING INPATIENT: Freq: Every day | Status: DC

## 2015-09-18 MED ORDER — ONDANSETRON HCL 4 MG/2ML IJ SOLN
4.0000 mg | Freq: Four times a day (QID) | INTRAMUSCULAR | Status: DC | PRN
Start: 2015-09-18 — End: 2015-09-21

## 2015-09-18 MED ORDER — ONDANSETRON HCL 4 MG PO TABS: 4.0000 mg | ORAL_TABLET | Freq: Four times a day (QID) | ORAL | Status: DC | PRN

## 2015-09-18 MED ORDER — INSULIN ASPART 100 UNIT/ML ~~LOC~~ SOLN
0.0000 [IU] | Freq: Three times a day (TID) | SUBCUTANEOUS | Status: DC
  Administered 2015-09-19: 1 [IU] via SUBCUTANEOUS
  Administered 2015-09-19: 2 [IU] via SUBCUTANEOUS
  Administered 2015-09-20: 3 [IU] via SUBCUTANEOUS
  Administered 2015-09-20: 7 [IU] via SUBCUTANEOUS
  Administered 2015-09-20 – 2015-09-21 (×3): 5 [IU] via SUBCUTANEOUS
  Filled 2015-09-18: qty 5
  Filled 2015-09-18: qty 3
  Filled 2015-09-18 (×2): qty 5
  Filled 2015-09-18: qty 7
  Filled 2015-09-18: qty 5
  Filled 2015-09-18: qty 2
  Filled 2015-09-18: qty 1

## 2015-09-18 MED ORDER — SODIUM CHLORIDE 0.9% FLUSH: 3.0000 mL | INTRAVENOUS | Status: DC | PRN

## 2015-09-18 MED ORDER — NYSTATIN 100000 UNIT/ML MT SUSP
5.0000 mL | Freq: Four times a day (QID) | OROMUCOSAL | Status: DC
  Administered 2015-09-18 – 2015-09-21 (×13): 500000 [IU] via ORAL
  Filled 2015-09-18 (×13): qty 5

## 2015-09-18 MED ORDER — INSULIN GLARGINE 100 UNIT/ML ~~LOC~~ SOLN
10.0000 [IU] | Freq: Every day | SUBCUTANEOUS | Status: DC
  Administered 2015-09-18 – 2015-09-20 (×3): 10 [IU] via SUBCUTANEOUS
  Filled 2015-09-18 (×4): qty 0.1

## 2015-09-18 MED ORDER — FERROUS SULFATE 325 (65 FE) MG PO TABS
325.0000 mg | ORAL_TABLET | Freq: Two times a day (BID) | ORAL | Status: DC
  Administered 2015-09-18 – 2015-09-21 (×7): 325 mg via ORAL
  Filled 2015-09-18 (×7): qty 1

## 2015-09-18 MED ORDER — ENOXAPARIN SODIUM 30 MG/0.3ML ~~LOC~~ SOLN
30.0000 mg | SUBCUTANEOUS | Status: DC
  Administered 2015-09-18 – 2015-09-20 (×3): 30 mg via SUBCUTANEOUS
  Filled 2015-09-18 (×3): qty 0.3

## 2015-09-18 MED ORDER — WARFARIN SODIUM 3 MG PO TABS
3.0000 mg | ORAL_TABLET | Freq: Every day | ORAL | Status: DC
  Filled 2015-09-18: qty 1

## 2015-09-18 MED ORDER — GLUCERNA SHAKE PO LIQD
237.0000 mL | Freq: Two times a day (BID) | ORAL | Status: DC
  Administered 2015-09-18 – 2015-09-21 (×7): 237 mL via ORAL

## 2015-09-18 MED ORDER — WARFARIN - PHARMACIST DOSING INPATIENT: Freq: Every day | Status: DC

## 2015-09-18 MED ORDER — SODIUM CHLORIDE 0.9 % IV SOLN: 250.0000 mL | INTRAVENOUS | Status: DC | PRN

## 2015-09-18 MED ORDER — CALCITRIOL 0.25 MCG PO CAPS
0.2500 ug | ORAL_CAPSULE | Freq: Every day | ORAL | Status: DC
  Administered 2015-09-18 – 2015-09-21 (×4): 0.25 ug via ORAL
  Filled 2015-09-18 (×4): qty 1

## 2015-09-18 MED ORDER — LEVOTHYROXINE SODIUM 25 MCG PO TABS
125.0000 ug | ORAL_TABLET | Freq: Every day | ORAL | Status: DC
  Administered 2015-09-18 – 2015-09-21 (×4): 125 ug via ORAL
  Filled 2015-09-18 (×4): qty 1

## 2015-09-18 MED ORDER — AMLODIPINE BESYLATE 5 MG PO TABS
5.0000 mg | ORAL_TABLET | Freq: Every day | ORAL | Status: DC
  Administered 2015-09-18 – 2015-09-21 (×4): 5 mg via ORAL
  Filled 2015-09-18 (×4): qty 1

## 2015-09-18 MED ORDER — ACETAMINOPHEN 650 MG RE SUPP: 650.0000 mg | Freq: Four times a day (QID) | RECTAL | Status: DC | PRN

## 2015-09-18 MED ORDER — VITAMIN D 1000 UNITS PO TABS
1000.0000 [IU] | ORAL_TABLET | Freq: Every day | ORAL | Status: DC
  Administered 2015-09-18 – 2015-09-21 (×4): 1000 [IU] via ORAL
  Filled 2015-09-18 (×4): qty 1

## 2015-09-18 MED ORDER — DEXTROSE 5 % IV SOLN
2.0000 g | INTRAVENOUS | Status: DC
  Administered 2015-09-19 – 2015-09-20 (×2): 2 g via INTRAVENOUS
  Filled 2015-09-18 (×2): qty 2

## 2015-09-18 MED ORDER — LISINOPRIL 20 MG PO TABS
20.0000 mg | ORAL_TABLET | Freq: Every day | ORAL | Status: DC
  Administered 2015-09-18 – 2015-09-21 (×4): 20 mg via ORAL
  Filled 2015-09-18 (×4): qty 1

## 2015-09-18 MED ORDER — VANCOMYCIN HCL IN DEXTROSE 1-5 GM/200ML-% IV SOLN
1000.0000 mg | INTRAVENOUS | Status: DC
  Filled 2015-09-18: qty 200

## 2015-09-18 MED ORDER — WARFARIN SODIUM 1 MG PO TABS: 5.0000 mg | ORAL_TABLET | Freq: Every day | ORAL | Status: DC

## 2015-09-18 MED ORDER — SODIUM CHLORIDE 0.9% FLUSH
10.0000 mL | INTRAVENOUS | Status: DC | PRN
  Administered 2015-09-19: 10 mL
  Filled 2015-09-18: qty 40

## 2015-09-18 MED ORDER — VANCOMYCIN HCL IN DEXTROSE 1-5 GM/200ML-% IV SOLN
1000.0000 mg | Freq: Once | INTRAVENOUS | Status: AC
  Administered 2015-09-18: 1000 mg via INTRAVENOUS
  Filled 2015-09-18: qty 200

## 2015-09-18 MED ORDER — WARFARIN SODIUM 1 MG PO TABS
5.0000 mg | ORAL_TABLET | Freq: Once | ORAL | Status: AC
  Administered 2015-09-18: 5 mg via ORAL
  Filled 2015-09-18: qty 5

## 2015-09-18 MED ORDER — POLYETHYLENE GLYCOL 3350 17 G PO PACK: 17.0000 g | PACK | Freq: Every day | ORAL | Status: DC | PRN

## 2015-09-18 MED ORDER — ALTEPLASE 2 MG IJ SOLR
2.0000 mg | Freq: Once | INTRAMUSCULAR | Status: AC
  Administered 2015-09-18: 2 mg
  Filled 2015-09-18: qty 2

## 2015-09-18 MED ORDER — SODIUM CHLORIDE 0.9 % IV SOLN
INTRAVENOUS | Status: AC
Start: 2015-09-18 — End: 2015-09-18
  Administered 2015-09-18 (×2): via INTRAVENOUS

## 2015-09-18 MED ORDER — SODIUM CHLORIDE 0.9% FLUSH
3.0000 mL | Freq: Two times a day (BID) | INTRAVENOUS | Status: DC
  Administered 2015-09-18 – 2015-09-21 (×6): 3 mL via INTRAVENOUS

## 2015-09-18 MED ORDER — ENOXAPARIN SODIUM 40 MG/0.4ML ~~LOC~~ SOLN: 40.0000 mg | SUBCUTANEOUS | Status: DC

## 2015-09-18 MED ORDER — ACETAMINOPHEN 325 MG PO TABS
650.0000 mg | ORAL_TABLET | Freq: Four times a day (QID) | ORAL | Status: DC | PRN
  Administered 2015-09-18 – 2015-09-19 (×2): 650 mg via ORAL
  Filled 2015-09-18 (×2): qty 2

## 2015-09-18 MED ORDER — CEFEPIME HCL 2 G IJ SOLR
2.0000 g | Freq: Once | INTRAMUSCULAR | Status: AC
  Administered 2015-09-18: 2 g via INTRAVENOUS
  Filled 2015-09-18: qty 2

## 2015-09-18 NOTE — Evaluation (Signed)
Physical Therapy Evaluation Patient Details Name: Alejandro Armstrong MRN: [id number] DOB: [birth year] Today's Date: 09/18/2015   History of Present Illness  Pt is a 82 y.o. male with PMH of idiopathic thrombocytopenia purpura, HTN, diabetes, renal agenesis, renal disease, chemotherapy.  Pt presented with a PICC line infection.  Pt was admitted for MRSA infection recently, now PICC line infected and replaced. Pt also complains of fever with chills and is s/p fall.  Clinical Impression  Pt is a pleasant 82 year old male who was admitted for fever, chills, and s/p fall. Pt performs bed mobility with min assist, transfers with cga, and ambulation with cga with 40' IV pole. Pt demonstrates deficits with strength/endurance/mobility. Pt somewhat self limiting in ambulation distance secondary to complaints of being cold. Recommend further ambulation with rw for further sessions. Would benefit from skilled PT to address above deficits and promote optimal return to PLOF.      Follow Up Recommendations Home health PT    Equipment Recommendations  Rolling walker with 5" wheels    Recommendations for Other Services       Precautions / Restrictions Precautions Precautions: Fall Restrictions Weight Bearing Restrictions: No      Mobility  Bed Mobility Overal bed mobility: Needs Assistance Bed Mobility: Supine to Sit     Supine to sit: Min assist     General bed mobility comments: min assist given for bed mobility and scooting out towards EOB. Once seated at EOB, pt able to sit with independence  Transfers Overall transfer level: Needs assistance Equipment used: 1 person hand held assist Transfers: Sit to/from Stand Sit to Stand: Min guard         General transfer comment: HHA given for transfer. Cues to push from seated surface. Safe technique performed.  Ambulation/Gait Ambulation/Gait assistance: Min guard Ambulation Distance (Feet): 40 Feet Assistive device:  (IV pole) Gait  Pattern/deviations: Step-to pattern     General Gait Details: ambulated using IV pole for assistance. Step to gait pattern noted. Pt fatigues with ambulation, requesting to return back to bed. Noted pt holding onto IV pole with 2 hands. Recommend RW for further mobility attempts.  Stairs            Wheelchair Mobility    Modified Rankin (Stroke Patients Only)       Balance Overall balance assessment: History of Falls;Needs assistance Sitting-balance support: Single extremity supported;Feet supported Sitting balance-Leahy Scale: Good     Standing balance support: Bilateral upper extremity supported Standing balance-Leahy Scale: Good                               Pertinent Vitals/Pain      Home Living Family/patient expects to be discharged to:: Private residence Living Arrangements: Spouse/significant other Available Help at Discharge: Family Type of Home: House Home Access: Stairs to enter Entrance Stairs-Rails: Left Entrance Stairs-Number of Steps: 3 Home Layout: One level Home Equipment: Cane - single point      Prior Function Level of Independence: Independent with assistive device(s)         Comments: SPC     Hand Dominance        Extremity/Trunk Assessment   Upper Extremity Assessment: Overall WFL for tasks assessed           Lower Extremity Assessment: Generalized weakness (grossly 4/5 on B LE)      Cervical / Trunk Assessment: Normal  Communication   Communication: No difficulties  Cognition Arousal/Alertness: Awake/alert Behavior During Therapy: Agitated Overall Cognitive Status: Within Functional Limits for tasks assessed                      General Comments      Exercises        Assessment/Plan    PT Assessment Patient needs continued PT services  PT Diagnosis Generalized weakness;Abnormality of gait   PT Problem List Decreased strength;Decreased activity tolerance;Decreased balance;Decreased  mobility;Decreased knowledge of use of DME  PT Treatment Interventions DME instruction;Gait training;Stair training;Functional mobility training;Therapeutic activities;Therapeutic exercise;Patient/family education   PT Goals (Current goals can be found in the Care Plan section) Acute Rehab PT Goals Patient Stated Goal: to go home  PT Goal Formulation: With patient Time For Goal Achievement: 10/02/15 Potential to Achieve Goals: Good    Frequency Min 2X/week   Barriers to discharge        Co-evaluation               End of Session Equipment Utilized During Treatment: Gait belt Activity Tolerance: Patient tolerated treatment well Patient left: in bed;with bed alarm set Nurse Communication: Mobility status         Time: [id number] PT Time Calculation (min) (ACUTE ONLY): 17 min   Charges:   PT Evaluation $PT Eval Moderate Complexity: 1 Procedure     PT G Codes:        Severina Sykora 2015/09/27, 4:44 PM Greggory Stallion, PT, DPT [phone]

## 2015-09-18 NOTE — Progress Notes (Signed)
Pharmacy Antibiotic Note  Alejandro Armstrong is a 82 y.o. male admitted on 09/18/2015 with bacteremia.  Pharmacy has been consulted for vancomycin and cefepime dosing. Pt with recent hospitalization for MRSA bacteremia and line sepsis.  Plan: kei 0.02 hr-1  T1/2 36 hours Vd 53.4 L  Vancomycin 1 gram IV q 36 hours. Goal trough 15-20. Pt received a dose at 0426. Will order random vancomycin level for 4/5 at 1400 as pt was receiving vancomycin as an outpt to see where we are. SCr up a little bit from labs at discharge.   Cefepime 2 grams IV q 24 hours ordered.   Height: 5\' 7"  (170.2 cm) Weight: 168 lb 3.2 oz (76.295 kg) IBW/kg (Calculated) : 66.1  Temp (24hrs), Avg:98.1 F (36.7 C), Min:97.6 F (36.4 C), Max:99 F (37.2 C)   Recent Labs Lab 09/18/15 0231 09/18/15 0238 09/18/15 0543  WBC 5.0  --   --   CREATININE 3.06*  --   --   LATICACIDVEN  --  1.6 0.8    Estimated Creatinine Clearance: 17.1 mL/min (by C-G formula based on Cr of 3.06).    No Known Allergies  Antimicrobials this admission: Vancomycin >>  cefepime 4/4 >>    Dose adjustments this admission:   Microbiology results: 4/4 BCx: pending UCx sent   Thank you for allowing pharmacy to be a part of this patient's care.  Rocky Morel 09/18/2015 11:31 AM

## 2015-09-18 NOTE — Progress Notes (Signed)
Initial Nutrition Assessment      INTERVENTION:  -Monitor intake. Pt may benefit from liberalizing diet secondary to poor po intake -Recommend glucerna BID for added kcals and protein    NUTRITION DIAGNOSIS:   Inadequate oral intake related to poor appetite as evidenced by per patient/family report.    GOAL:   Patient will meet greater than or equal to 90% of their needs    MONITOR:   PO intake, Supplement acceptance  REASON FOR ASSESSMENT:   Malnutrition Screening Tool    ASSESSMENT:   82 y/o male admitted with fever, MRSA bactermia with PICC line as outpatient for IV antibiotics  Past Medical History  Diagnosis Date  . Hyperlipidemia   . Hypertension   . Hypothyroidism   . Type 2 diabetes mellitus ([place])   . Renal agenesis     discovered at age 53  . Recurrent UTI   . Ureteral stricture   . Kidney stones     Ate few bites of lunch today, tray observed.  Does not like hospital food.  "I wouldn't feed this to my dog." Reports intake has been decreased for the past 2 days prior to admission, otherwise normal intake  Medications reviewed Fe sulfate, lantus, nystatin, NS at 151ml/hr, miralax Labs reviewed: BUN 36, creatinine 3.06, glucose 133  Nutrition-Focused physical exam completed. Findings are no fat depletion, no muscle depletion, and no edema.   Diet Order:  Diet heart healthy/carb modified Room service appropriate?: Yes; Fluid consistency:: Thin  Skin:  Reviewed, no issues  Last BM:  09/18/2015  Height:   Ht Readings from Last 1 Encounters:  09/18/15 5\' 7"  (1.702 m)    Weight: 9% wt loss in the last month per wt encounters (pt reports was on predinsone and then started on lasix - thinks most wt loss is fluid wt)  Wt Readings from Last 1 Encounters:  09/18/15 168 lb 3.2 oz (76.295 kg)    Ideal Body Weight:     BMI:  Body mass index is 26.34 kg/(m^2).  Estimated Nutritional Needs:   Kcal:  [id number] kcals/d  Protein:  76-91  g/d  Fluid:  1900-2259ml/d  EDUCATION NEEDS:   No education needs identified at this time  Claramae Rigdon B. Zenia Resides, [place], [place] (pager) Weekend/On-Call pager [phone])

## 2015-09-18 NOTE — ED Notes (Signed)
Pt c/o "sliding out of bed" at home, denies any pain. Pt also had fever with EMS at 103. Pt has new PICC line to right neck. Pt A&O at this time. Pt states he has a low platelet condition, he gets transfusions frequently.  VS stable

## 2015-09-18 NOTE — Plan of Care (Signed)
Problem: Nutrition: Goal: Adequate nutrition will be maintained Outcome: Progressing Dietary has added Glucerna to supplement meals.

## 2015-09-18 NOTE — H&P (Signed)
[place] [facility] [place] NAME: Alejandro Armstrong    MR#:  [id number]  DATE OF BIRTH:  December 25, 1929  DATE OF ADMISSION:  09/18/2015  PRIMARY CARE PHYSICIAN: Casilda Carls, MD   REQUESTING/REFERRING PHYSICIAN:   CHIEF COMPLAINT:   Chief Complaint  Patient presents with  . Fall  . Fever    HISTORY OF PRESENT ILLNESS: Alejandro Armstrong  is a 82 y.o. male with a known history of hypertension, hyperlipidemia, hypothyroidism, type 2 diabetes mellitus, MRSA bacteremia,ckd presented to the emergency room from the home for fever. Patient had a fever of 10 59F and he also slipped down to the floor from the bed. No history of any head injury or external body injury. In the last admission of her hospital patient had MRSA bacteremia and line sepsis. His PICC line was removed from the arm and new PICC line was placed to the right side of the neck. Patient completed 2 weeks of IV vancomycin antibiotic at home. Case was discussed by ER physician with infectious disease doctor who recommended vancomycin and IV cefepime antibiotics in the hospital. Has occasional cough which is dry in nature. No history of chest pain. No complaints of shortness of breath. No history of abdominal pain nausea vomiting diarrhea.  PAST MEDICAL HISTORY:   Past Medical History  Diagnosis Date  . Hyperlipidemia   . Hypertension   . Hypothyroidism   . Type 2 diabetes mellitus ([place])   . Renal agenesis     discovered at age 20  . Recurrent UTI   . Ureteral stricture   . Kidney stones     PAST SURGICAL HISTORY: Past Surgical History  Procedure Laterality Date  . Kidney stone surgery      SOCIAL HISTORY:  Social History  Substance Use Topics  . Smoking status: Never Smoker   . Smokeless tobacco: Never Used  . Alcohol Use: No    FAMILY HISTORY:  Family History  Problem Relation Age of Onset  . Cervical cancer Sister     DRUG ALLERGIES: No Known Allergies  REVIEW OF  SYSTEMS:   CONSTITUTIONAL: Has fever, has weakness.  EYES: No blurred or double vision.  EARS, NOSE, AND THROAT: No tinnitus or ear pain.  RESPIRATORY: Occasional cough, no shortness of breath, no wheezing or hemoptysis.  CARDIOVASCULAR: No chest pain, orthopnea, edema.  GASTROINTESTINAL: No nausea, vomiting, diarrhea or abdominal pain.  GENITOURINARY: No dysuria, hematuria.  ENDOCRINE: No polyuria, nocturia,  HEMATOLOGY: No anemia, easy bruising or bleeding SKIN: No rash or lesion. MUSCULOSKELETAL: No joint pain or arthritis.   NEUROLOGIC: No tingling, numbness, weakness.  PSYCHIATRY: No anxiety or depression.   MEDICATIONS AT HOME:  Prior to Admission medications   Medication Sig Start Date End Date Taking? Authorizing Provider  amLODipine (NORVASC) 5 MG tablet Take 1 tablet (5 mg total) by mouth daily. 09/06/15  Yes Epifanio Lesches, MD  calcitRIOL (ROCALTROL) 0.25 MCG capsule Take 0.25 mcg by mouth daily.   Yes Historical Provider, MD  cholecalciferol (VITAMIN D) 1000 units tablet Take 1,000 Units by mouth daily.   Yes Historical Provider, MD  enoxaparin (LOVENOX) 40 MG/0.4ML injection Inject 0.8 mLs (80 mg total) into the skin daily. 09/06/15  Yes Epifanio Lesches, MD  ferrous sulfate 325 (65 FE) MG tablet Take 325 mg by mouth.   Yes Historical Provider, MD  insulin glargine (LANTUS) 100 UNIT/ML injection Inject 0.1 mLs (10 Units total) into the skin at bedtime. 08/08/15  Yes Fritzi Mandes,  MD  levothyroxine (SYNTHROID, LEVOTHROID) 125 MCG tablet  08/07/14  Yes Historical Provider, MD  lisinopril (PRINIVIL,ZESTRIL) 20 MG tablet Take 20 mg by mouth. 06/24/12  Yes Historical Provider, MD  nystatin (MYCOSTATIN) 100000 UNIT/ML suspension Take 5 mLs (500,000 Units total) by mouth 4 (four) times daily. 08/16/15  Yes Mayra Reel, NP  Omega-3 1000 MG CAPS Take by mouth.   Yes Historical Provider, MD  polyethylene glycol (MIRALAX / GLYCOLAX) packet Take 17 g by mouth daily as needed for mild  constipation. 08/08/15  Yes Fritzi Mandes, MD  sodium bicarbonate 650 MG tablet  08/07/14  Yes Historical Provider, MD  warfarin (COUMADIN) 3 MG tablet Take 1 tablet (3 mg total) by mouth daily. 09/06/15  Yes Epifanio Lesches, MD      PHYSICAL EXAMINATION:   VITAL SIGNS: Blood pressure 116/71, pulse 56, temperature 99 F (37.2 C), temperature source Oral, resp. rate 20, SpO2 95 %.  GENERAL:  82 y.o.-year-old patient lying in the bed with no acute distress.  EYES: Pupils equal, round, reactive to light and accommodation. No scleral icterus. Extraocular muscles intact.  HEENT: Head atraumatic, normocephalic. Oropharynx and nasopharynx clear. Catheter line noted in the right side of neck NECK:  Supple, no jugular venous distention. No thyroid enlargement, no tenderness.  LUNGS: Normal breath sounds bilaterally, no wheezing, rales,rhonchi or crepitation. No use of accessory muscles of respiration.  CARDIOVASCULAR: S1, S2 normal. No murmurs, rubs, or gallops.  ABDOMEN: Soft, nontender, nondistended. Bowel sounds present. No organomegaly or mass.  EXTREMITIES: No pedal edema, cyanosis, or clubbing.  NEUROLOGIC: Cranial nerves II through XII are intact. Muscle strength 5/5 in all extremities. Sensation intact. Gait not checked.  PSYCHIATRIC: The patient is alert and oriented x 3.  SKIN: No obvious rash, lesion, or ulcer.   LABORATORY PANEL:   CBC  Recent Labs Lab 09/18/15 0231  WBC 5.0  HGB 9.5*  HCT 27.8*  PLT 123*  MCV 92.8  MCH 31.7  MCHC 34.1  RDW 15.3*  LYMPHSABS 1.6  MONOABS 0.8  EOSABS 0.1  BASOSABS 0.1   ------------------------------------------------------------------------------------------------------------------  Chemistries   Recent Labs Lab 09/18/15 0231  NA 136  K 4.8  CL 113*  CO2 19*  GLUCOSE 133*  BUN 36*  CREATININE 3.06*  CALCIUM 8.3*  AST 23  ALT 18  ALKPHOS 105  BILITOT 0.4    ------------------------------------------------------------------------------------------------------------------ estimated creatinine clearance is 18.6 mL/min (by C-G formula based on Cr of 3.06). ------------------------------------------------------------------------------------------------------------------ No results for input(s): TSH, T4TOTAL, T3FREE, THYROIDAB in the last 72 hours.  Invalid input(s): FREET3   Coagulation profile No results for input(s): INR, PROTIME in the last 168 hours. ------------------------------------------------------------------------------------------------------------------- No results for input(s): DDIMER in the last 72 hours. -------------------------------------------------------------------------------------------------------------------  Cardiac Enzymes No results for input(s): CKMB, TROPONINI, MYOGLOBIN in the last 168 hours.  Invalid input(s): CK ------------------------------------------------------------------------------------------------------------------ Invalid input(s): POCBNP  ---------------------------------------------------------------------------------------------------------------  Urinalysis    Component Value Date/Time   COLORURINE YELLOW* 09/18/2015 0409   COLORURINE Yellow 10/15/2012 2114   APPEARANCEUR HAZY* 09/18/2015 0409   APPEARANCEUR Cloudy 10/15/2012 2114   LABSPEC 1.015 09/18/2015 0409   LABSPEC 1.014 10/15/2012 2114   PHURINE 7.0 09/18/2015 0409   PHURINE 5.0 10/15/2012 2114   GLUCOSEU 150* 09/18/2015 0409   GLUCOSEU Negative 10/15/2012 2114   HGBUR NEGATIVE 09/18/2015 0409   HGBUR 1+ 10/15/2012 2114   BILIRUBINUR NEGATIVE 09/18/2015 0409   BILIRUBINUR Negative 10/15/2012 2114   KETONESUR NEGATIVE 09/18/2015 0409   KETONESUR Negative 10/15/2012 2114   PROTEINUR >500* 09/18/2015 0409  PROTEINUR 100 mg/dL 10/15/2012 2114   NITRITE NEGATIVE 09/18/2015 0409   NITRITE Positive 10/15/2012 2114    LEUKOCYTESUR 1+* 09/18/2015 0409   LEUKOCYTESUR 3+ 10/15/2012 2114     RADIOLOGY: Dg Chest 2 View  09/18/2015  CLINICAL DATA:  Fever and cough.  Fell today. EXAM: CHEST  2 VIEW COMPARISON:  09/04/2015 FINDINGS: There is a right jugular central line with tip in the SVC near the azygos vein junction. The lungs are clear, mildly hyperinflated. Pulmonary vasculature is normal. There is no pleural effusion. Hilar, mediastinal and cardiac contours are unremarkable and unchanged. IMPRESSION: Mild hyperinflation.  No acute findings. Electronically Signed   By: Andreas Newport M.D.   On: 09/18/2015 02:59    EKG: Orders placed or performed during the hospital encounter of 09/18/15  . EKG 12-Lead  . EKG 12-Lead    IMPRESSION AND PLAN: 82 year old male patient with history of chronic kidney disease, MRSA bacteremia, type 2 diabetes mellitus, hypertension, hypothyroidism, hyperlipidemia presented to the emergency room with fever. Patient was recently treated for MRSA bacteremia with IV vancomycin antibiotic. Admitting diagnosis 1. Fever 2. Bacteremia 3.Hypertension 4.Hyperlipidemia 5.History of Immune thrombocytopenic purpura Treatment plan Admit patient to medical floor Start patient on IV vancomycin and IV cefepime antibiotics Infectious disease consultation DVT prophylaxis subcutaneous Lovenox 40 MG daily Resume home medication Follow-up cultures.  All the records are reviewed and case discussed with ED provider. Management plans discussed with the patient, family and they are in agreement.  CODE STATUS:FULL Code Status History    Date Active Date Inactive Code Status Order ID Comments User Context   09/01/2015 11:55 PM 09/06/2015  6:35 PM Partial Code [id number]  Sylvan Cheese, MD Inpatient   08/06/2015  1:41 AM 08/08/2015  2:33 PM Full Code [id number]  Hillary Bow, MD ED   08/03/2015  2:23 PM 08/04/2015  1:52 PM Full Code [id number]  [name], MD Inpatient   07/24/2015 12:35 PM  07/29/2015  5:12 PM Full Code [id number]  Lytle Butte, MD Inpatient    Questions for Most Recent Historical Code Status (Order [id number])    Question Answer Comment   In the event of cardiac or respiratory ARREST: Initiate Code Blue, Call Rapid Response Yes    In the event of cardiac or respiratory ARREST: Perform CPR Yes    In the event of cardiac or respiratory ARREST: Perform Intubation/Mechanical Ventilation No    In the event of cardiac or respiratory ARREST: Use NIPPV/BiPAp only if indicated Yes    In the event of cardiac or respiratory ARREST: Administer ACLS medications if indicated Yes    In the event of cardiac or respiratory ARREST: Perform Defibrillation or Cardioversion if indicated Yes     Advance Directive Documentation        Most Recent Value   Type of Advance Directive  Living will   Pre-existing out of facility DNR order (yellow form or pink MOST form)     "MOST" Form in Place?         TOTAL TIME TAKING CARE OF THIS PATIENT: 45 minutes.    Saundra Shelling M.D on 09/18/2015 at 5:07 AM  Between 7am to 6pm - Pager - [phone]  After 6pm go to [email or web address] - password [email or web address] [facility]  Office  [phone]  CC: Primary care physician; Casilda Carls, MD

## 2015-09-18 NOTE — ED Notes (Signed)
Family at bedside. States pt slid out of bed because of the carpet, denies mechanical fall. Also states fever started around 1400 today.

## 2015-09-18 NOTE — Progress Notes (Signed)
Pharmacy Antibiotic Note  Alejandro Armstrong is a 82 y.o. male admitted on 09/18/2015 with bacteremia.  Pharmacy has been consulted for vancomycin and cefepime dosing.  Plan: TBW 80.6kg  IBW 66.1kg  DW 80.6kg  Vd 56L kei 0.02 hr-1  T1/2 35 hours  Vancomycin 1 gram q 36 hours ordered with stacked dosing. Level before 4th dose. Goal trough 15-20.  Cefepime 2 grams q 24 hours ordered.     Temp (24hrs), Avg:99 F (37.2 C), Min:99 F (37.2 C), Max:99 F (37.2 C)   Recent Labs Lab 09/18/15 0231 09/18/15 0238  WBC 5.0  --   CREATININE 3.06*  --   LATICACIDVEN  --  1.6    Estimated Creatinine Clearance: 18.6 mL/min (by C-G formula based on Cr of 3.06).    No Known Allergies  Antimicrobials this admission: vancomycin cefepine >>    >>   Dose adjustments this admission:   Microbiology results: 4/4 BCx: pending   UA: LE(+) NO(-) WBC TNTC CXR: no acute diseae  Thank you for allowing pharmacy to be a part of this patient's care.  Jaymian Bogart S 09/18/2015 5:42 AM

## 2015-09-18 NOTE — ED Notes (Addendum)
Pt from home via EMS for fall, denies any LOC. Pt states "I didn't fall, I just got in the floor" Pt had 103 temp , EMS gave 800 ibuprofen en route. Pt states he has platelet transfusions often and has a PICC line for it. Last PICC line caused MRSA and had blood clot, site was moved to right neck. Pt  A&O, denies pain.

## 2015-09-18 NOTE — ED Provider Notes (Signed)
[facility] Emergency Department Provider Note  ____________________________________________  Time seen: Approximately 3:22 AM  I have reviewed the triage vital signs and the nursing notes.   HISTORY  Chief Complaint Fall and Fever    HPI Alejandro Armstrong is a 82 y.o. male with a complicated medical history that includes frequent platelet transfusions managed by Dr. Grayland Ormond who has a central line and who is had multiple admissions for MRSA bacteremia.  He just finished an outpatient course of vancomycin through his central line about 36 hours ago.  He presents tonight for evaluation of generalized weakness and inability to ambulate in the setting of a fever to 103.  He has had some nausea but no vomiting.  The onset of the symptoms was relatively acute.  He reportedly had a good day yesterday but then today was feeling lethargic, weak, and it got worse by the evening where he was unable to stand even with assistance.  He has had no pain when he urinates.  Mild recent cough, nonproductive.  No chest pain or shortness of breath, no abdominal pain, no vomiting.  Decreased energy level.  No focal numbness or weakness.  The patient is followed by Dr. Ola Spurr and has an appointment at 8:30 in the morning to follow-up.   Past Medical History  Diagnosis Date  . Hyperlipidemia   . Hypertension   . Hypothyroidism   . Type 2 diabetes mellitus ([place])   . Renal agenesis     discovered at age 31  . Recurrent UTI   . Ureteral stricture   . Kidney stones     Patient Active Problem List   Diagnosis Date Noted  . Fever chills 09/18/2015  . Bacteremia 09/18/2015  . Acute on chronic renal insufficiency ([place]) 09/01/2015  . Hyperkalemia 09/01/2015  . MRSA bacteremia secondary to PICC line infection 09/01/2015  . Type 2 diabetes mellitus ([place]) 09/01/2015  . UTI (lower urinary tract infection) 08/06/2015  . ITP (idiopathic thrombocytopenic purpura) 08/02/2015  .  Thrombocytopenia ([place]) 07/24/2015  . B12 deficiency 07/24/2015    Past Surgical History  Procedure Laterality Date  . Kidney stone surgery      No current outpatient prescriptions on file.  Allergies Review of patient's allergies indicates no known allergies.  Family History  Problem Relation Age of Onset  . Cervical cancer Sister     Social History Social History  Substance Use Topics  . Smoking status: Never Smoker   . Smokeless tobacco: Never Used  . Alcohol Use: No    Review of Systems Constitutional: fever/chills (measured at 103) Eyes: No visual changes. ENT: No sore throat. Cardiovascular: Denies chest pain. Respiratory: Denies shortness of breath.  Mild cough. Gastrointestinal: No abdominal pain.  nausea, no vomiting.  No diarrhea.  No constipation. Genitourinary: Negative for dysuria. Musculoskeletal: Negative for back pain. Skin: Negative for rash. Neurological: Negative for headaches, focal weakness or numbness.  10-point ROS otherwise negative.  ____________________________________________   PHYSICAL EXAM:  VITAL SIGNS: ED Triage Vitals  Enc Vitals Group     BP 09/18/15 0205 131/80 mmHg     Pulse Rate 09/18/15 0205 72     Resp 09/18/15 0205 16     Temp 09/18/15 0205 99 F (37.2 C)     Temp Source 09/18/15 0205 Oral     SpO2 09/18/15 0205 95 %     Weight --      Height --      Head Cir --  Peak Flow --      Pain Score 09/18/15 0203 0     Pain Loc --      Pain Edu? --      Excl. in [place]? --     Constitutional: Alert and oriented. Chronic ill appearance.  Pale/ Eyes: Conjunctivae are normal. PERRL. EOMI. Head: Atraumatic. Nose: No congestion/rhinnorhea. Mouth/Throat: Mucous membranes are moist.  Oropharynx non-erythematous. Neck: No stridor.  No meningeal signs.   Right IJ catheter in place. Cardiovascular: Normal rate, regular rhythm. Good peripheral circulation. Grossly normal heart sounds.   Respiratory: Normal respiratory effort.   No retractions. Lungs CTAB. Gastrointestinal: Soft and nontender. No distention.  Musculoskeletal: No lower extremity tenderness nor edema. No gross deformities of extremities. Neurologic:  Normal speech and language. No gross focal neurologic deficits are appreciated.  Skin:  Skin is warm, dry and intact. No rash noted.   ____________________________________________   LABS (all labs ordered are listed, but only abnormal results are displayed)  Labs Reviewed  CBC WITH DIFFERENTIAL/PLATELET - Abnormal; Notable for the following:    RBC 3.00 (*)    Hemoglobin 9.5 (*)    HCT 27.8 (*)    RDW 15.3 (*)    Platelets 123 (*)    All other components within normal limits  COMPREHENSIVE METABOLIC PANEL - Abnormal; Notable for the following:    Chloride 113 (*)    CO2 19 (*)    Glucose, Bld 133 (*)    BUN 36 (*)    Creatinine, Ser 3.06 (*)    Calcium 8.3 (*)    Total Protein 5.8 (*)    Albumin 2.9 (*)    GFR calc non Af Amer 17 (*)    GFR calc Af Amer 20 (*)    Anion gap 4 (*)    All other components within normal limits  URINALYSIS COMPLETEWITH MICROSCOPIC ([facility] ONLY) - Abnormal; Notable for the following:    Color, Urine YELLOW (*)    APPearance HAZY (*)    Glucose, UA 150 (*)    Protein, ur >500 (*)    Leukocytes, UA 1+ (*)    All other components within normal limits  PROTIME-INR - Abnormal; Notable for the following:    Prothrombin Time 15.3 (*)    All other components within normal limits  CULTURE, BLOOD (ROUTINE X 2)  CULTURE, BLOOD (ROUTINE X 2)  URINE CULTURE  LACTIC ACID, PLASMA  LACTIC ACID, PLASMA   ____________________________________________  EKG  ED ECG REPORT I, Aiko Belko, the attending physician, personally viewed and interpreted this ECG.  Date: 09/18/2015 EKG Time: 02:06 Rate: 72 Rhythm: normal sinus rhythm QRS Axis: normal Intervals: normal ST/T Wave abnormalities: normal Conduction Disturbances: none Narrative Interpretation:  unremarkable  ____________________________________________  RADIOLOGY   Dg Chest 2 View  09/18/2015  CLINICAL DATA:  Fever and cough.  Fell today. EXAM: CHEST  2 VIEW COMPARISON:  09/04/2015 FINDINGS: There is a right jugular central line with tip in the SVC near the azygos vein junction. The lungs are clear, mildly hyperinflated. Pulmonary vasculature is normal. There is no pleural effusion. Hilar, mediastinal and cardiac contours are unremarkable and unchanged. IMPRESSION: Mild hyperinflation.  No acute findings. Electronically Signed   By: Andreas Newport M.D.   On: 09/18/2015 02:59    ____________________________________________   PROCEDURES  Procedure(s) performed: None  Critical Care performed: No ____________________________________________   INITIAL IMPRESSION / ASSESSMENT AND PLAN / ED COURSE  Pertinent labs & imaging results that were available during my care  of the patient were reviewed by me and considered in my medical decision making (see chart for details).  The patient does not meet sepsis criteria upon arrival but I initiated a broad evaluation given his history of MRSA bacteremia.  I ordered cefepime and vancomycin as per the ED order set for sepsis anticipating a line infection.  We are waiting for pharmacy to mix it and send it up.  ----------------------------------------- 4:21 AM on 09/18/2015 -----------------------------------------  I spoke by phone with Dr. Ola Spurr who recommended admitting the patient to the hospital in light of the history of MRSA bacteremia and fever in spite of just finishing the antibiotics.  He agreed with my plan for cefepime and vancomycin.  I spoke with the hospitalist who will admit.   (Note that documentation was delayed due to multiple ED patients requiring immediate care.)   8:40 AM Grossly infected urine.  Patient already admitted, but I placed an order for urine culture add-on and called the lab to bring it to their  attention.  ______________________ ______________________  FINAL CLINICAL IMPRESSION(S) / ED DIAGNOSES  Final diagnoses:  Fever, unspecified fever cause  History of bacteremia  Status post PICC central line placement  Urinary tract infection    NEW MEDICATIONS STARTED DURING THIS VISIT:  Current Discharge Medication List        Note:  This document was prepared using Dragon voice recognition software and may include unintentional dictation errors.   Hinda Kehr, MD 09/18/15 [phone]

## 2015-09-18 NOTE — Care Management Note (Signed)
Case Management Note  Patient Details  Name: Alejandro Armstrong MRN: [id number] Date of Birth: February 12, 1930  Subjective/Objective:   Patient admitted with fever.  Patient lives at home with his wife. Recent admission with bacteremia. He was discharged on home IV antibiotics.  Patient infusion was provided by [facility]  [name].  Nursing services provided by [facility].  [name] will [facility] notified of Admission.                     Action/Plan: PT has recommend home health PT.  Resumption of care orders will need to be placed prior to discharge. ID MD following.   Expected Discharge Date:                  Expected Discharge Plan:     In-House Referral:     Discharge planning Services     Post Acute Care Choice:    Choice offered to:     DME Arranged:    DME Agency:     HH Arranged:    [place] Agency:     Status of Service:     Medicare Important Message Given:    Date Medicare IM Given:    Medicare IM give by:    Date Additional Medicare IM Given:    Additional Medicare Important Message give by:     If discussed at [place] of Stay Meetings, dates discussed:    Additional Comments:  Beverly Sessions, RN 09/18/2015, 3:48 PM

## 2015-09-18 NOTE — Progress Notes (Signed)
ANTICOAGULATION CONSULT NOTE - Initial Consult  Pharmacy Consult for Warfarin Indication: DVT (hx)  No Known Allergies  Patient Measurements: Height: 5\' 7"  (170.2 cm) Weight: 168 lb 3.2 oz (76.295 kg) IBW/kg (Calculated) : 66.1  Vital Signs: Temp: 99.9 F (37.7 C) (04/04 1359) Temp Source: Oral (04/04 1359) BP: 148/71 mmHg (04/04 1318) Pulse Rate: 76 (04/04 1318)  Labs:  Recent Labs  09/18/15 0231  HGB 9.5*  HCT 27.8*  PLT 123*  LABPROT 15.3*  INR 1.19  CREATININE 3.06*    Estimated Creatinine Clearance: 17.1 mL/min (by C-G formula based on Cr of 3.06).   Assessment: 82 yo male on warfarin 3 mg daily at home with subtherapeutic INR on admission. Per rounding MD, pt stated he has been taking warfarin. INR on admission 1.19.  Goal of Therapy:  INR 2-3 Monitor platelets by anticoagulation protocol: Yes   Plan:  Will give higher dose of warfarin 5 mg PO x1 tonight INR in AM  Rayna Sexton L 09/18/2015,3:18 PM

## 2015-09-18 NOTE — Consult Note (Signed)
[facility] Infectious Disease     Reason for Consult:Fever, prior MRSA bacteremia    Referring Physician: Dutch Gray Date of Admission:  09/18/2015   Active Problems:   Fever chills   Bacteremia   HPI: Alejandro Armstrong is a 82 y.o. 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 The line was noted to be grossly infected at insertion and it was removed however bcx came back + for MRSA so pt was called to be admitted. Wound cx also +.Started on vanco 3/16 x 1 dose. BCX 3/18 NGTD. Had DVT R arm as well. Dced on 2 weeks IV vanco and was doing well until stopped abx Sunday and then Monday night became febrile and confused. Has had Bcx done and restarted abx.  CXR neg, UA with TNTC WBC. WBC was 5.    Past Medical History  Diagnosis Date  . Hyperlipidemia   . Hypertension   . Hypothyroidism   . Type 2 diabetes mellitus (HCC)   . Renal agenesis     discovered at age 72  . Recurrent UTI   . Ureteral stricture   . Kidney stones    Past Surgical History  Procedure Laterality Date  . Kidney stone surgery     Social History  Substance Use Topics  . Smoking status: Never Smoker   . Smokeless tobacco: Never Used  . Alcohol Use: No   Family History  Problem Relation Age of Onset  . Cervical cancer Sister     Allergies: No Known Allergies  Current antibiotics: Antibiotics Given (last 72 hours)    Date/Time Action Medication Dose Rate   09/18/15 0703 Given   ceFEPIme (MAXIPIME) 2 g in dextrose 5 % 50 mL IVPB 2 g 100 mL/hr      MEDICATIONS: . amLODipine  5 mg Oral Daily  . calcitRIOL  0.25 mcg Oral Daily  . [START ON 09/19/2015] ceFEPime (MAXIPIME) IV  2 g Intravenous Q24H  . cholecalciferol  1,000 Units Oral Daily  . enoxaparin (LOVENOX) injection  30 mg Subcutaneous Q24H  . ferrous sulfate  325 mg Oral BID WC  . insulin glargine  10 Units Subcutaneous QHS  . levothyroxine  125 mcg Oral QAC breakfast  . lisinopril  20 mg Oral Daily   . nystatin  5 mL Oral QID  . sodium chloride flush  3 mL Intravenous Q12H  . [START ON 09/19/2015] vancomycin  1,000 mg Intravenous Q36H  . warfarin  3 mg Oral q1800  . Warfarin - Physician Dosing Inpatient   Does not apply q1800    Review of Systems - 11 systems reviewed and negative per HPI   OBJECTIVE: Temp:  [97.6 F (36.4 C)-99.9 F (37.7 C)] 99.9 F (37.7 C) (04/04 1359) Pulse Rate:  [54-76] 76 (04/04 1318) Resp:  [15-20] 18 (04/04 1318) BP: (115-152)/(71-80) 148/71 mmHg (04/04 1318) SpO2:  [93 %-98 %] 98 % (04/04 1318) Weight:  [76.295 kg (168 lb 3.2 oz)] 76.295 kg (168 lb 3.2 oz) (04/04 0622) Physical Exam  Constitutional: He is oriented to person, place, and time. Frail, chonically ill appearing HENT: anicteric Mouth/Throat: Oropharynx is clear and dry . No oropharyngeal exudate.  Cardiovascular: Normal rate, regular rhythm and normal heart sounds. Exam reveals no gallop and no friction rub. d.  Pulmonary/Chest: Effort normal and breath sounds normal. No respiratory distress. He has no wheezes.  Abdominal: Soft. Bowel sounds are normal. He exhibits no distension. There is no  tenderness.  Lymphadenopathy: He has no cervical adenopathy.  Neurological: He is alert and oriented to person, place, and time.  Skin: Skin is warm and dry. No rash noted. No erythema.  R neck line wnl Psychiatric: He has a normal mood and affect. His behavior is normal.   LABS: Results for orders placed or performed during the hospital encounter of 09/18/15 (from the past 48 hour(s))  Blood culture (routine x 2)     Status: None (Preliminary result)   Collection Time: 09/18/15  2:31 AM  Result Value Ref Range   Specimen Description BLOOD RIGHT HAND    Special Requests BOTTLES DRAWN AEROBIC AND ANAEROBIC    Culture NO GROWTH < 12 HOURS    Report Status PENDING   Blood culture (routine x 2)     Status: None (Preliminary result)   Collection Time: 09/18/15  2:31 AM  Result Value Ref Range    Specimen Description BLOOD BLOOD LEFT FOREARM    Special Requests BOTTLES DRAWN AEROBIC AND ANAEROBIC    Culture NO GROWTH < 12 HOURS    Report Status PENDING   CBC with Differential/Platelet     Status: Abnormal   Collection Time: 09/18/15  2:31 AM  Result Value Ref Range   WBC 5.0 3.8 - 10.6 K/uL   RBC 3.00 (L) 4.40 - 5.90 MIL/uL   Hemoglobin 9.5 (L) 13.0 - 18.0 g/dL   HCT 93.0 (L) 68.4 - 05.0 %   MCV 92.8 80.0 - 100.0 fL   MCH 31.7 26.0 - 34.0 pg   MCHC 34.1 32.0 - 36.0 g/dL   RDW 20.3 (H) 55.7 - 33.7 %   Platelets 123 (L) 150 - 440 K/uL   Neutrophils Relative % 48 %   Neutro Abs 2.4 1.4 - 6.5 K/uL   Lymphocytes Relative 33 %   Lymphs Abs 1.6 1.0 - 3.6 K/uL   Monocytes Relative 17 %   Monocytes Absolute 0.8 0.2 - 1.0 K/uL   Eosinophils Relative 1 %   Eosinophils Absolute 0.1 0 - 0.7 K/uL   Basophils Relative 1 %   Basophils Absolute 0.1 0 - 0.1 K/uL  Comprehensive metabolic panel     Status: Abnormal   Collection Time: 09/18/15  2:31 AM  Result Value Ref Range   Sodium 136 135 - 145 mmol/L   Potassium 4.8 3.5 - 5.1 mmol/L   Chloride 113 (H) 101 - 111 mmol/L   CO2 19 (L) 22 - 32 mmol/L   Glucose, Bld 133 (H) 65 - 99 mg/dL   BUN 36 (H) 6 - 20 mg/dL   Creatinine, Ser 8.01 (H) 0.61 - 1.24 mg/dL   Calcium 8.3 (L) 8.9 - 10.3 mg/dL   Total Protein 5.8 (L) 6.5 - 8.1 g/dL   Albumin 2.9 (L) 3.5 - 5.0 g/dL   AST 23 15 - 41 U/L   ALT 18 17 - 63 U/L   Alkaline Phosphatase 105 38 - 126 U/L   Total Bilirubin 0.4 0.3 - 1.2 mg/dL   GFR calc non Af Amer 17 (L) >60 mL/min   GFR calc Af Amer 20 (L) >60 mL/min    Comment: (NOTE) The eGFR has been calculated using the CKD EPI equation. This calculation has not been validated in all clinical situations. eGFR's persistently <60 mL/min signify possible Chronic Kidney Disease.    Anion gap 4 (L) 5 - 15  Protime-INR     Status: Abnormal   Collection Time: 09/18/15  2:31 AM  Result  Value Ref Range   Prothrombin Time 15.3 (H) 11.4  - 15.0 seconds   INR 1.19   Lactic acid, plasma     Status: None   Collection Time: 09/18/15  2:38 AM  Result Value Ref Range   Lactic Acid, Venous 1.6 0.5 - 2.0 mmol/L  Urinalysis complete, with microscopic ([facility] only)     Status: Abnormal   Collection Time: 09/18/15  4:09 AM  Result Value Ref Range   Color, Urine YELLOW (A) YELLOW   APPearance HAZY (A) CLEAR   Glucose, UA 150 (A) NEGATIVE mg/dL   Bilirubin Urine NEGATIVE NEGATIVE   Ketones, ur NEGATIVE NEGATIVE mg/dL   Specific Gravity, Urine 1.015 1.005 - 1.030   Hgb urine dipstick NEGATIVE NEGATIVE   pH 7.0 5.0 - 8.0   Protein, ur >500 (A) NEGATIVE mg/dL   Nitrite NEGATIVE NEGATIVE   Leukocytes, UA 1+ (A) NEGATIVE   RBC / HPF 0-5 0 - 5 RBC/hpf   WBC, UA TOO NUMEROUS TO COUNT 0 - 5 WBC/hpf   Bacteria, UA NONE SEEN NONE SEEN   Squamous Epithelial / LPF NONE SEEN NONE SEEN   Mucous PRESENT   Lactic acid, plasma     Status: None   Collection Time: 09/18/15  5:43 AM  Result Value Ref Range   Lactic Acid, Venous 0.8 0.5 - 2.0 mmol/L   No components found for: ESR, C REACTIVE PROTEIN MICRO: Recent Results (from the past 720 hour(s))  Blood culture (routine x 2)     Status: None   Collection Time: 08/30/15  8:20 PM  Result Value Ref Range Status   Specimen Description BLOOD LEFT HAND  Final   Special Requests BOTTLES DRAWN AEROBIC AND ANAEROBIC 5ML  Final   Culture  Setup Time   Final    GRAM POSITIVE COCCI IN BOTH AEROBIC AND ANAEROBIC BOTTLES PREVIOUSLY CALLED AT 0932 08/31/15 BY [facility].    Culture   Final    METHICILLIN RESISTANT STAPHYLOCOCCUS AUREUS IN BOTH AEROBIC AND ANAEROBIC BOTTLES    Report Status 09/02/2015 FINAL  Final   Organism ID, Bacteria METHICILLIN RESISTANT STAPHYLOCOCCUS AUREUS  Final      Susceptibility   Methicillin resistant staphylococcus aureus - MIC*    CIPROFLOXACIN >=8 RESISTANT Resistant     ERYTHROMYCIN >=8 RESISTANT Resistant     GENTAMICIN <=0.5 SENSITIVE Sensitive     OXACILLIN >=4  RESISTANT Resistant     TETRACYCLINE <=1 SENSITIVE Sensitive     VANCOMYCIN <=0.5 SENSITIVE Sensitive     TRIMETH/SULFA <=10 SENSITIVE Sensitive     CLINDAMYCIN <=0.25 SENSITIVE Sensitive     RIFAMPIN <=0.5 SENSITIVE Sensitive     Inducible Clindamycin NEGATIVE Sensitive     * METHICILLIN RESISTANT STAPHYLOCOCCUS AUREUS  Blood culture (routine x 2)     Status: None   Collection Time: 08/30/15  8:45 PM  Result Value Ref Range Status   Specimen Description BLOOD RIGHT HAND  Final   Special Requests BOTTLES DRAWN AEROBIC AND ANAEROBIC 5ML  Final   Culture  Setup Time   Final    GRAM POSITIVE COCCI IN BOTH AEROBIC AND ANAEROBIC BOTTLES CRITICAL RESULT CALLED TO, READ BACK BY AND VERIFIED WITH: MELISSA [place] @ 6712 08/31/15 BY [place]    Culture   Final    METHICILLIN RESISTANT STAPHYLOCOCCUS AUREUS IN BOTH AEROBIC AND ANAEROBIC BOTTLES    Report Status 09/02/2015 FINAL  Final   Organism ID, Bacteria METHICILLIN RESISTANT STAPHYLOCOCCUS AUREUS  Final      Susceptibility  Methicillin resistant staphylococcus aureus - MIC*    CIPROFLOXACIN >=8 RESISTANT Resistant     ERYTHROMYCIN >=8 RESISTANT Resistant     GENTAMICIN <=0.5 SENSITIVE Sensitive     OXACILLIN >=4 RESISTANT Resistant     TETRACYCLINE <=1 SENSITIVE Sensitive     VANCOMYCIN 1 SENSITIVE Sensitive     TRIMETH/SULFA <=10 SENSITIVE Sensitive     CLINDAMYCIN <=0.25 SENSITIVE Sensitive     RIFAMPIN <=0.5 SENSITIVE Sensitive     Inducible Clindamycin NEGATIVE Sensitive     * METHICILLIN RESISTANT STAPHYLOCOCCUS AUREUS  Blood Culture ID Panel (Reflexed)     Status: Abnormal   Collection Time: 08/30/15  8:45 PM  Result Value Ref Range Status   Enterococcus species NOT DETECTED NOT DETECTED Final   Vancomycin resistance NOT DETECTED NOT DETECTED Final   Listeria monocytogenes NOT DETECTED NOT DETECTED Final   Staphylococcus species DETECTED (A) NOT DETECTED Final    Comment: CRITICAL RESULT CALLED TO, READ BACK BY AND VERIFIED  WITH: Malena Peer @ 1459 08/31/15 by [facility]    Staphylococcus aureus DETECTED (A) NOT DETECTED Final    Comment: CRITICAL RESULT CALLED TO, READ BACK BY AND VERIFIED WITH: Malena Peer @ 1459 08/31/15 by [facility]    Methicillin resistance DETECTED (A) NOT DETECTED Final    Comment: CRITICAL RESULT CALLED TO, READ BACK BY AND VERIFIED WITH: Malena Peer @ 1459 08/31/15 by [facility]    Streptococcus species NOT DETECTED NOT DETECTED Final   Streptococcus agalactiae NOT DETECTED NOT DETECTED Final   Streptococcus pneumoniae NOT DETECTED NOT DETECTED Final   Streptococcus pyogenes NOT DETECTED NOT DETECTED Final   Acinetobacter baumannii NOT DETECTED NOT DETECTED Final   Enterobacteriaceae species NOT DETECTED NOT DETECTED Final   Enterobacter cloacae complex NOT DETECTED NOT DETECTED Final   Escherichia coli NOT DETECTED NOT DETECTED Final   Klebsiella oxytoca NOT DETECTED NOT DETECTED Final   Klebsiella pneumoniae NOT DETECTED NOT DETECTED Final   Proteus species NOT DETECTED NOT DETECTED Final   Serratia marcescens NOT DETECTED NOT DETECTED Final   Carbapenem resistance NOT DETECTED NOT DETECTED Final   Haemophilus influenzae NOT DETECTED NOT DETECTED Final   Neisseria meningitidis NOT DETECTED NOT DETECTED Final   Pseudomonas aeruginosa NOT DETECTED NOT DETECTED Final   Candida albicans NOT DETECTED NOT DETECTED Final   Candida glabrata NOT DETECTED NOT DETECTED Final   Candida krusei NOT DETECTED NOT DETECTED Final   Candida parapsilosis NOT DETECTED NOT DETECTED Final   Candida tropicalis NOT DETECTED NOT DETECTED Final  Culture, blood (Routine X 2) w Reflex to ID Panel     Status: None   Collection Time: 08/30/15 10:31 PM  Result Value Ref Range Status   Specimen Description BLOOD RIGHT ARM  Final   Special Requests BOTTLES DRAWN AEROBIC AND ANAEROBIC  Final   Culture  Setup Time   Final    GRAM POSITIVE COCCI IN BOTH AEROBIC AND ANAEROBIC BOTTLES CRITICAL VALUE NOTED.  VALUE IS  CONSISTENT WITH PREVIOUSLY REPORTED AND CALLED VALUE.    Culture   Final    METHICILLIN RESISTANT STAPHYLOCOCCUS AUREUS IN BOTH AEROBIC AND ANAEROBIC BOTTLES    Report Status 09/02/2015 FINAL  Final   Organism ID, Bacteria METHICILLIN RESISTANT STAPHYLOCOCCUS AUREUS  Final      Susceptibility   Methicillin resistant staphylococcus aureus - MIC*    CIPROFLOXACIN >=8 RESISTANT Resistant     ERYTHROMYCIN >=8 RESISTANT Resistant     GENTAMICIN <=0.5 SENSITIVE Sensitive     OXACILLIN >=4  RESISTANT Resistant     TETRACYCLINE <=1 SENSITIVE Sensitive     VANCOMYCIN <=0.5 SENSITIVE Sensitive     TRIMETH/SULFA <=10 SENSITIVE Sensitive     CLINDAMYCIN <=0.25 SENSITIVE Sensitive     RIFAMPIN <=0.5 SENSITIVE Sensitive     Inducible Clindamycin NEGATIVE Sensitive     * METHICILLIN RESISTANT STAPHYLOCOCCUS AUREUS  Wound culture     Status: None   Collection Time: 08/30/15 10:32 PM  Result Value Ref Range Status   Specimen Description WOUND  Final   Special Requests NONE  Final   Gram Stain FEW GRAM POSITIVE COCCI  Final   Culture   Final    MODERATE GROWTH METHICILLIN RESISTANT STAPHYLOCOCCUS AUREUS   Report Status 09/02/2015 FINAL  Final   Organism ID, Bacteria METHICILLIN RESISTANT STAPHYLOCOCCUS AUREUS  Final      Susceptibility   Methicillin resistant staphylococcus aureus - MIC*    CIPROFLOXACIN >=8 RESISTANT Resistant     ERYTHROMYCIN >=8 RESISTANT Resistant     GENTAMICIN <=0.5 SENSITIVE Sensitive     OXACILLIN >=4 RESISTANT Resistant     TETRACYCLINE <=1 SENSITIVE Sensitive     VANCOMYCIN 1 SENSITIVE Sensitive     TRIMETH/SULFA <=10 SENSITIVE Sensitive     CLINDAMYCIN <=0.25 SENSITIVE Sensitive     RIFAMPIN <=0.5 SENSITIVE Sensitive     Inducible Clindamycin NEGATIVE Sensitive     * MODERATE GROWTH METHICILLIN RESISTANT STAPHYLOCOCCUS AUREUS  Wound culture     Status: None   Collection Time: 08/30/15 10:56 PM  Result Value Ref Range Status   Specimen Description BLOOD  Final    Special Requests NONE  Final   Gram Stain MODERATE RED BLOOD CELLS NO ORGANISMS SEEN   Final   Culture METHICILLIN RESISTANT STAPHYLOCOCCUS AUREUS  Final   Report Status 09/02/2015 FINAL  Final   Organism ID, Bacteria METHICILLIN RESISTANT STAPHYLOCOCCUS AUREUS  Final      Susceptibility   Methicillin resistant staphylococcus aureus - MIC*    CIPROFLOXACIN >=8 RESISTANT Resistant     ERYTHROMYCIN >=8 RESISTANT Resistant     GENTAMICIN <=0.5 SENSITIVE Sensitive     OXACILLIN >=4 RESISTANT Resistant     TETRACYCLINE <=1 SENSITIVE Sensitive     VANCOMYCIN 1 SENSITIVE Sensitive     TRIMETH/SULFA <=10 SENSITIVE Sensitive     CLINDAMYCIN <=0.25 SENSITIVE Sensitive     RIFAMPIN <=0.5 SENSITIVE Sensitive     Inducible Clindamycin NEGATIVE Sensitive     * METHICILLIN RESISTANT STAPHYLOCOCCUS AUREUS  Culture, blood (Routine X 2) w Reflex to ID Panel     Status: None   Collection Time: 09/01/15  8:52 PM  Result Value Ref Range Status   Specimen Description BLOOD LEFT WRIST  Final   Special Requests   Final    BOTTLES DRAWN AEROBIC AND ANAEROBIC [id number],9CCANA   Culture NO GROWTH 5 DAYS  Final   Report Status 09/06/2015 FINAL  Final  Culture, blood (Routine X 2) w Reflex to ID Panel     Status: None   Collection Time: 09/01/15  8:53 PM  Result Value Ref Range Status   Specimen Description BLOOD RIGHT WRIST  Final   Special Requests BOTTLES DRAWN AEROBIC AND ANAEROBIC Circle  Final   Culture NO GROWTH 5 DAYS  Final   Report Status 09/06/2015 FINAL  Final  CULTURE, BLOOD (ROUTINE X 2) w Reflex to PCR ID Panel     Status: None   Collection Time: 09/03/15  8:49 AM  Result Value Ref Range Status  Specimen Description BLOOD RIGHT ARM  Final   Special Requests   Final    BOTTLES DRAWN AEROBIC AND ANAEROBIC  AEROBIC 3CC, ANAEROBIC 1CC   Culture NO GROWTH 5 DAYS  Final   Report Status 09/08/2015 FINAL  Final  CULTURE, BLOOD (ROUTINE X 2) w Reflex to PCR ID Panel     Status: None    Collection Time: 09/03/15  8:53 AM  Result Value Ref Range Status   Specimen Description BLOOD LEFT HAND  Final   Special Requests BOTTLES DRAWN AEROBIC AND ANAEROBIC  0.5CC  Final   Culture NO GROWTH 5 DAYS  Final   Report Status 09/08/2015 FINAL  Final  Blood culture (routine x 2)     Status: None (Preliminary result)   Collection Time: 09/18/15  2:31 AM  Result Value Ref Range Status   Specimen Description BLOOD RIGHT HAND  Final   Special Requests BOTTLES DRAWN AEROBIC AND ANAEROBIC 10ML  Final   Culture NO GROWTH < 12 HOURS  Final   Report Status PENDING  Incomplete  Blood culture (routine x 2)     Status: None (Preliminary result)   Collection Time: 09/18/15  2:31 AM  Result Value Ref Range Status   Specimen Description BLOOD BLOOD LEFT FOREARM  Final   Special Requests BOTTLES DRAWN AEROBIC AND ANAEROBIC 5ML  Final   Culture NO GROWTH < 12 HOURS  Final   Report Status PENDING  Incomplete    IMAGING: Dg Chest 1 View  09/04/2015  CLINICAL DATA:  Status post PICC line placement EXAM: CHEST 1 VIEW COMPARISON:  09/01/2015 FINDINGS: Cardiac shadow is stable. The lungs are well aerated bilaterally. A new right jugular PICC line is noted with the catheter tip in the distal superior vena cava. No pneumothorax is noted. No bony abnormality is seen. IMPRESSION: PICC line in satisfactory position. No other focal abnormality is noted. Electronically Signed   By: Inez Catalina M.D.   On: 09/04/2015 17:26   Dg Chest 2 View  09/18/2015  CLINICAL DATA:  Fever and cough.  Fell today. EXAM: CHEST  2 VIEW COMPARISON:  09/04/2015 FINDINGS: There is a right jugular central line with tip in the SVC near the azygos vein junction. The lungs are clear, mildly hyperinflated. Pulmonary vasculature is normal. There is no pleural effusion. Hilar, mediastinal and cardiac contours are unremarkable and unchanged. IMPRESSION: Mild hyperinflation.  No acute findings. Electronically Signed   By: Andreas Newport M.D.    On: 09/18/2015 02:59   US Renal  09/02/2015  CLINICAL DATA:  Renal failure EXAM: RENAL / URINARY TRACT ULTRASOUND COMPLETE COMPARISON:  CT 08/06/2015 FINDINGS: Right Kidney: Absent. Left Kidney: Length: 16.3 cm. Echogenic, with parenchymal thinning consistent with atrophy. Lower pole cortical calcifications are present, similar to the appearances on CT. No conclusive collecting system calculi. No hydronephrosis per Bladder: Appears normal for degree of bladder distention. Left ureteral jet observed. IMPRESSION: Absent right kidney. Left kidney appears atrophic with echogenic parenchyma consistent with medical renal disease. No hydronephrosis. Left ureteral jet documented at the urinary bladder. Electronically Signed   By: Andreas Newport M.D.   On: 09/02/2015 01:54   US Venous Img Upper Uni Right  09/03/2015  CLINICAL DATA:  Right upper extremity edema following PICC line infection with subsequent removal. Evaluate for DVT. EXAM: RIGHT UPPER EXTREMITY VENOUS DOPPLER ULTRASOUND TECHNIQUE: Gray-scale sonography with graded compression, as well as color Doppler and duplex ultrasound were performed to evaluate the upper extremity deep venous system from  the level of the subclavian vein and including the jugular, axillary, basilic, radial, ulnar and upper cephalic vein. Spectral Doppler was utilized to evaluate flow at rest and with distal augmentation maneuvers. COMPARISON:  None. FINDINGS: Contralateral Subclavian Vein: Respiratory phasicity is normal and symmetric with the symptomatic side. No evidence of thrombus. Normal compressibility. Internal Jugular Vein: No evidence of thrombus. Normal compressibility, respiratory phasicity and response to augmentation. Subclavian Vein: No evidence of thrombus. Normal compressibility, respiratory phasicity and response to augmentation. Axillary Vein: No evidence of thrombus. Normal compressibility, respiratory phasicity and response to augmentation. Cephalic Vein: No  evidence of thrombus. Normal compressibility, respiratory phasicity and response to augmentation. Basilic Vein: There is hypoechoic nonocclusive thrombus within the right basilic vein at the level of the shoulder (representative image 38). Brachial Veins: There is hypoechoic occlusive slightly expansile thrombus within both of the paired right brachial veins (representative image 34). Radial Veins: No evidence of thrombus. Normal compressibility, respiratory phasicity and response to augmentation. Ulnar Veins: No evidence of thrombus. Normal compressibility, respiratory phasicity and response to augmentation. Venous Reflux:  None visualized. Other Findings: No definitive cystic or solid lesions correlate with the patient's area of swelling within the upper arm (representative images 43 and 44). IMPRESSION: 1. Examination is positive for occlusive DVT within both paired right brachial veins. There is no definitive extension of this DVT to the more central aspect of the right upper extremity venous system. 2. Examination is positive for nonocclusive thrombus within the right basilic vein at the level the shoulder. These results will be called to the ordering clinician or representative by the Radiologist Assistant, and communication documented in the PACS or zVision Dashboard. Electronically Signed   By: Sandi Mariscal M.D.   On: 09/03/2015 15:27   Dg Chest Port 1 View  09/01/2015  CLINICAL DATA:  Recent removal of the PICC line after developing erythema at the site. Now with developing weakness. EXAM: PORTABLE CHEST 1 VIEW COMPARISON:  08/03/2015 FINDINGS: Mildly prominent right hilar contours are likely vascular, accentuated due to the AP portable acquisition. The lungs are clear. Pulmonary vasculature is normal. There is no large effusion. Heart size is upper normal, unchanged. IMPRESSION: No acute cardiopulmonary findings. Electronically Signed   By: Andreas Newport M.D.   On: 09/01/2015 21:13    Assessment:    Alejandro Armstrong is a 82 y.o. male ith ITP, CKD who had a recent admission last month for  an infected PICC line. The line was noted to be grossly infected at insertion and it was removed however bcx came back + for MRSA so pt was called to be admitted. Wound cx also +.Started on vanco 3/16 x 1 dose. [place] 3/18 NGTD. Had DVT R arm as well. Dced on 2 weeks IV vanco and was doing well until stopped abx Sunday and then Monday night became febrile and confused. Has had Bcx done and restarted abx.  CXR neg, UA with TNTC WBC. WBC was 5.  He was being dosed with vanco q 48 hours and got last dose Sunday so would be unusual for MRSA to flare this quickly. He does have evidence of UTI however, with TNTC WBC and UCX pending. Reports decerased uop but no dysuria   Recommendations Cont vanco and cefepime pending ucx and bcx results Check post void residual to eval for urinary retention as cause of UTI  Thank you very much for allowing me to participate in the care of this patient. Please call with questions.   Shanon Brow  Sibyl Parr, MD

## 2015-09-18 NOTE — Progress Notes (Addendum)
Pt arrived on unit. VSS. PICC line already placed prior to hospitalization, RN change PICC dressing upon arrival to unit. Next due date for dress change will be 09/25/2015. Pt was placed on contact for rule out MRSA that is pending from blood cultures. Will continue to monitor pt.   Alejandro Armstrong

## 2015-09-19 LAB — BASIC METABOLIC PANEL
Anion gap: 6 (ref 5–15)
BUN: 32 mg/dL — AB (ref 6–20)
CHLORIDE: 115 mmol/L — AB (ref 101–111)
CO2: 16 mmol/L — AB (ref 22–32)
Calcium: 8 mg/dL — ABNORMAL LOW (ref 8.9–10.3)
Creatinine, Ser: 3.06 mg/dL — ABNORMAL HIGH (ref 0.61–1.24)
GFR calc Af Amer: 20 mL/min — ABNORMAL LOW (ref >60)
GFR calc non Af Amer: 17 mL/min — ABNORMAL LOW (ref >60)
Glucose, Bld: 99 mg/dL (ref 65–99)
POTASSIUM: 4.8 mmol/L (ref 3.5–5.1)
SODIUM: 137 mmol/L (ref 135–145)

## 2015-09-19 LAB — VANCOMYCIN, RANDOM: VANCOMYCIN RM: 24 ug/mL

## 2015-09-19 LAB — URINE CULTURE
Culture: NO GROWTH
Special Requests: NORMAL

## 2015-09-19 LAB — PROTIME-INR
INR: 1.16
Prothrombin Time: 15 seconds (ref 11.4–15.0)

## 2015-09-19 LAB — INFLUENZA PANEL BY PCR (TYPE A & B)
H1N1 flu by pcr: NOT DETECTED
Influenza A By PCR: NEGATIVE
Influenza B By PCR: POSITIVE — AB

## 2015-09-19 LAB — CBC
HCT: 32.4 % — ABNORMAL LOW (ref 40.0–52.0)
Hemoglobin: 11 g/dL — ABNORMAL LOW (ref 13.0–18.0)
MCH: 32.3 pg (ref 26.0–34.0)
MCHC: 33.8 g/dL (ref 32.0–36.0)
MCV: 95.6 fL (ref 80.0–100.0)
Platelets: 107 10*3/uL — ABNORMAL LOW (ref 150–440)
RBC: 3.39 MIL/uL — ABNORMAL LOW (ref 4.40–5.90)
RDW: 15.8 % — AB (ref 11.5–14.5)
WBC: 5 10*3/uL (ref 3.8–10.6)

## 2015-09-19 LAB — GLUCOSE, CAPILLARY
GLUCOSE-CAPILLARY: 92 mg/dL (ref 65–99)
GLUCOSE-CAPILLARY: 139 mg/dL — AB (ref 65–99)
GLUCOSE-CAPILLARY: 282 mg/dL — AB (ref 65–99)
Glucose-Capillary: 175 mg/dL — ABNORMAL HIGH (ref 65–99)

## 2015-09-19 MED ORDER — WARFARIN SODIUM 2 MG PO TABS
4.0000 mg | ORAL_TABLET | Freq: Once | ORAL | Status: AC
  Administered 2015-09-19: 4 mg via ORAL
  Filled 2015-09-19: qty 2

## 2015-09-19 MED ORDER — ALTEPLASE 2 MG IJ SOLR
2.0000 mg | Freq: Once | INTRAMUSCULAR | Status: DC
  Filled 2015-09-19: qty 2

## 2015-09-19 MED ORDER — OSELTAMIVIR PHOSPHATE 75 MG PO CAPS
75.0000 mg | ORAL_CAPSULE | Freq: Two times a day (BID) | ORAL | Status: DC
  Administered 2015-09-19 – 2015-09-21 (×4): 75 mg via ORAL
  Filled 2015-09-19 (×4): qty 1

## 2015-09-19 MED ORDER — SODIUM BICARBONATE 650 MG PO TABS
650.0000 mg | ORAL_TABLET | Freq: Two times a day (BID) | ORAL | Status: DC
  Administered 2015-09-19 – 2015-09-21 (×5): 650 mg via ORAL
  Filled 2015-09-19 (×5): qty 1

## 2015-09-19 MED ORDER — SODIUM CHLORIDE 0.9 % IV BOLUS (SEPSIS)
500.0000 mL | Freq: Once | INTRAVENOUS | Status: AC
  Administered 2015-09-19: 500 mL via INTRAVENOUS

## 2015-09-19 MED ORDER — METHYLPREDNISOLONE SODIUM SUCC 125 MG IJ SOLR
60.0000 mg | INTRAMUSCULAR | Status: DC
  Administered 2015-09-19 – 2015-09-21 (×3): 60 mg via INTRAVENOUS
  Filled 2015-09-19 (×3): qty 2

## 2015-09-19 MED ORDER — IPRATROPIUM-ALBUTEROL 0.5-2.5 (3) MG/3ML IN SOLN
3.0000 mL | Freq: Four times a day (QID) | RESPIRATORY_TRACT | Status: DC
  Administered 2015-09-19 – 2015-09-21 (×8): 3 mL via RESPIRATORY_TRACT
  Filled 2015-09-19 (×9): qty 3

## 2015-09-19 MED ORDER — VANCOMYCIN HCL IN DEXTROSE 1-5 GM/200ML-% IV SOLN
1000.0000 mg | INTRAVENOUS | Status: DC
  Administered 2015-09-20: 1000 mg via INTRAVENOUS
  Filled 2015-09-19: qty 200

## 2015-09-19 NOTE — Progress Notes (Signed)
[facility] [place] NAME: Ronnell Horng    MR#:  [id number]  DATE OF BIRTH:  [birth year]  SUBJECTIVE:  CHIEF COMPLAINT:   Chief Complaint  Patient presents with  . Fall  . Fever   Admitted for weakness and fever. Recent MRSA bacteremia treated with IV vancomycin for 2 weeks and finished on 4 2017.  Continues to feel weak. Yellow sputum with cough. Wheezing.  REVIEW OF SYSTEMS:    Review of Systems  Constitutional: Negative for fever and chills.  HENT: Negative for sore throat.   Eyes: Negative for blurred vision, double vision and pain.  Respiratory: Negative for cough, hemoptysis, shortness of breath and wheezing.   Cardiovascular: Negative for chest pain, palpitations, orthopnea and leg swelling.  Gastrointestinal: Negative for heartburn, nausea, vomiting, abdominal pain, diarrhea and constipation.  Genitourinary: Negative for dysuria and hematuria.  Musculoskeletal: Negative for back pain and joint pain.  Skin: Negative for rash.  Neurological: Negative for sensory change, speech change, focal weakness and headaches.  Endo/Heme/Allergies: Does not bruise/bleed easily.  Psychiatric/Behavioral: Negative for depression. The patient is not nervous/anxious.     DRUG ALLERGIES:  No Known Allergies  VITALS:  Blood pressure 135/66, pulse 57, temperature 99.9 F (37.7 C), temperature source Oral, resp. rate 20, height 5\' 7"  (1.702 m), weight 76.295 kg (168 lb 3.2 oz), SpO2 94 %.  PHYSICAL EXAMINATION:   Physical Exam  GENERAL:  82 y.o.-year-old patient lying in the bed with no acute distress.  EYES: Pupils equal, round, reactive to light and accommodation. No scleral icterus. Extraocular muscles intact.  HEENT: Head atraumatic, normocephalic. Oropharynx and nasopharynx clear.  NECK:  Supple, no jugular venous distention. No thyroid enlargement, no tenderness.  LUNGS: Normal work of breathing. Bilateral wheezing. Poor  air entry. CARDIOVASCULAR: S1, S2 normal. No murmurs, rubs, or gallops.  ABDOMEN: Soft, nontender, nondistended. Bowel sounds present. No organomegaly or mass.  EXTREMITIES: No cyanosis, clubbing or edema b/l.    NEUROLOGIC: Cranial nerves II through XII are intact. No focal Motor or sensory deficits b/l.   PSYCHIATRIC: The patient is alert and oriented x 3.  SKIN: No obvious rash, lesion, or ulcer.  Right IJ PICC line.  LABORATORY PANEL:   CBC  Recent Labs Lab 09/19/15 0450  WBC 5.0  HGB 11.0*  HCT 32.4*  PLT 107*   ------------------------------------------------------------------------------------------------------------------ Chemistries   Recent Labs Lab 09/18/15 0231 09/19/15 0450  NA 136 137  K 4.8 4.8  CL 113* 115*  CO2 19* 16*  GLUCOSE 133* 99  BUN 36* 32*  CREATININE 3.06* 3.06*  CALCIUM 8.3* 8.0*  AST 23  --   ALT 18  --   ALKPHOS 105  --   BILITOT 0.4  --    ------------------------------------------------------------------------------------------------------------------  Cardiac Enzymes No results for input(s): TROPONINI in the last 168 hours. ------------------------------------------------------------------------------------------------------------------  RADIOLOGY:  Dg Chest 2 View  09/18/2015  CLINICAL DATA:  Fever and cough.  Fell today. EXAM: CHEST  2 VIEW COMPARISON:  09/04/2015 FINDINGS: There is a right jugular central line with tip in the SVC near the azygos vein junction. The lungs are clear, mildly hyperinflated. Pulmonary vasculature is normal. There is no pleural effusion. Hilar, mediastinal and cardiac contours are unremarkable and unchanged. IMPRESSION: Mild hyperinflation.  No acute findings. Electronically Signed   By: Andreas Newport M.D.   On: 09/18/2015 02:59     ASSESSMENT AND PLAN:   * Acute bronchitis -IV steroids - Scheduled Nebulizers -  Consult pulmonary if no improvement - Influenza PCR This could be the reason for  his fevers has blood cultures are negative.  * Fever Likely due to bronchitis. Need to rule out bacteremia as he recently had MRSA bacteremia and has a PICC line in place. Finish 2 weeks of IV vancomycin on 09/16/2015. Cultures no growth to date  * CKD stage IV is stable  * Diabetes mellitus Sliding scale insulin. 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 He is being started on IV Solu-Medrol for bronchitis. Should help if this is chronic ITP.  * DVT prophylaxis with heparin   All the records are reviewed and case discussed with Care Management/Social Workerr. Management plans discussed with the patient, family and they are in agreement.  CODE STATUS: FULL CODE  DVT Prophylaxis: SCDs  TOTAL TIME TAKING CARE OF THIS PATIENT: 35 minutes.   POSSIBLE D/C IN 1-2 DAYS, DEPENDING ON CLINICAL CONDITION.  Hillary Bow R M.D on 09/19/2015 at 11:50 AM  Between 7am to 6pm - Pager - [phone]  After 6pm go to [email or web address] - password [email or web address] [facility]  Office  [phone]  CC: Primary care physician; Casilda Carls, MD  Note: This dictation was prepared with Dragon dictation along with smaller phrase technology. Any transcriptional errors that result from this process are unintentional.

## 2015-09-19 NOTE — Progress Notes (Signed)
Paged MD regarding low UOP. MD stated to start a fluid bolus.

## 2015-09-19 NOTE — Progress Notes (Signed)
Pharmacy Antibiotic Note  Alejandro Armstrong is a 82 y.o. male admitted on 09/18/2015 with bacteremia.  Pharmacy has been consulted for vancomycin and cefepime dosing.  Plan: TBW 80.6kg  IBW 66.1kg  DW 80.6kg  Vd 56L kei 0.02 hr-1  T1/2 35 hours  Vancomycin 1 gram q 36 hours ordered with stacked dosing. Level before 4th dose. Goal trough 15-20. Vanc level resulted at 24. Will change dose to 1g q 48 hours. Will check trough on 4/12 @ 0230   Cefepime 2 grams q 24 hours ordered.  Height: 5\' 7"  (170.2 cm) Weight: 168 lb 3.2 oz (76.295 kg) IBW/kg (Calculated) : 66.1  Temp (24hrs), Avg:99.4 F (37.4 C), Min:98 F (36.7 C), Max:101.5 F (38.6 C)   Recent Labs Lab 09/18/15 0231 09/18/15 0238 09/18/15 0543 09/19/15 0450 09/19/15 1417  WBC 5.0  --   --  5.0  --   CREATININE 3.06*  --   --  3.06*  --   LATICACIDVEN  --  1.6 0.8  --   --   VANCORANDOM  --   --   --   --  24    Estimated Creatinine Clearance: 17.1 mL/min (by C-G formula based on Cr of 3.06).    No Known Allergies  Antimicrobials this admission: vancomycin cefepine >>    >>   Dose adjustments this admission:   Microbiology results: 4/4 BCx: ngtd   UA: LE(+) NO(-) WBC TNTC CXR: no acute diseae  Thank you for allowing pharmacy to be a part of this patient's care.  Ramond Dial 09/19/2015 3:14 PM

## 2015-09-19 NOTE — Progress Notes (Signed)
ANTICOAGULATION CONSULT NOTE - Initial Consult  Pharmacy Consult for Warfarin Indication: DVT (hx)  No Known Allergies  Patient Measurements: Height: 5\' 7"  (170.2 cm) Weight: 168 lb 3.2 oz (76.295 kg) IBW/kg (Calculated) : 66.1  Vital Signs: Temp: 99.9 F (37.7 C) (04/05 0619) Temp Source: Oral (04/05 0412) BP: 167/83 mmHg (04/05 0414) Pulse Rate: 75 (04/05 0414)  Labs:  Recent Labs  09/18/15 0231 09/19/15 0450  HGB 9.5* 11.0*  HCT 27.8* 32.4*  PLT 123* 107*  LABPROT 15.3* 15.0  INR 1.19 1.16  CREATININE 3.06* 3.06*    Estimated Creatinine Clearance: 17.1 mL/min (by C-G formula based on Cr of 3.06).   Assessment: 82 yo male on warfarin 3 mg daily at home with subtherapeutic INR on admission. Per rounding MD, pt stated he has been taking warfarin. Patient has been on warfarin for < 1 month for DVT. INR on admission 1.19.  4/4 INR 1.19 5mg  given 4/5 INR 1.16  Goal of Therapy:  INR 2-3 Monitor platelets by anticoagulation protocol: Yes   Plan:  Will give pt 4mg  tonight. Would consider changing weekly dose to 4mg  daily except 3mg  on T/Thrs. Will continue to monitor INR. INR in AM  Doriana Mazurkiewicz D Martise Waddell, Pharm.D Clinical Pharmacist   09/19/2015,7:34 AM

## 2015-09-20 LAB — GLUCOSE, CAPILLARY
Glucose-Capillary: 273 mg/dL — ABNORMAL HIGH (ref 65–99)
Glucose-Capillary: 244 mg/dL — ABNORMAL HIGH (ref 65–99)
Glucose-Capillary: 308 mg/dL — ABNORMAL HIGH (ref 65–99)
Glucose-Capillary: 379 mg/dL — ABNORMAL HIGH (ref 65–99)

## 2015-09-20 LAB — PROTIME-INR
INR: 1.3
Prothrombin Time: 16.3 seconds — ABNORMAL HIGH (ref 11.4–15.0)

## 2015-09-20 MED ORDER — WARFARIN SODIUM 2 MG PO TABS
4.0000 mg | ORAL_TABLET | Freq: Once | ORAL | Status: AC
  Administered 2015-09-20: 4 mg via ORAL
  Filled 2015-09-20: qty 1
  Filled 2015-09-20: qty 2

## 2015-09-20 NOTE — Progress Notes (Signed)
Physical Therapy Treatment Patient Details Name: Alejandro Armstrong MRN: [id number] DOB: [birth year] Today's Date: 09/20/2015    History of Present Illness Pt is a 82 y.o. male with PMH of idiopathic thrombocytopenia purpura, HTN, diabetes, renal agenesis, renal disease, chemotherapy.  Pt presented with a PICC line infection.  Pt was admitted for MRSA infection recently, now PICC line infected and replaced. Pt also complains of fever with chills and is s/p fall. Pt now diagnosed with flu, however reports symtoms are improving.    PT Comments    Pt has met all goals for dc and is safe with ambulation using SPC, which is baseline level. Pt demonstrates adequate strength and mobility intact. No LOB with mobility and pt will not require follow up PT. Will dc in house as pt has no further needs. Discussed with CM.  Follow Up Recommendations  No PT follow up     Equipment Recommendations  None recommended by PT    Recommendations for Other Services       Precautions / Restrictions Precautions Precautions: Fall Restrictions Weight Bearing Restrictions: No    Mobility  Bed Mobility Overal bed mobility: Modified Independent Bed Mobility: Supine to Sit     Supine to sit: Min guard     General bed mobility comments: HHA given for bed mobility. Once seated at EOB, pt able to sit with safe technique, no LOB noted.  Transfers Overall transfer level: Needs assistance Equipment used: Straight cane Transfers: Sit to/from Stand Sit to Stand: Min guard         General transfer comment: Pt performed transfer with cga and SPC in R hand. Safe technique performed with no LOB once standing at EOB.  Ambulation/Gait Ambulation/Gait assistance: Min guard Ambulation Distance (Feet): 100 Feet Assistive device: Straight cane Gait Pattern/deviations: Step-through pattern     General Gait Details:  Ambulated in room with SPC and reciprocal gait pattern. Pt demonstrates no LOB and good speed.  Pt on room air with sats WNL.    Stairs            Wheelchair Mobility    Modified Rankin (Stroke Patients Only)       Balance                                    Cognition Arousal/Alertness: Awake/alert Behavior During Therapy: WFL for tasks assessed/performed Overall Cognitive Status: Within Functional Limits for tasks assessed                      Exercises      General Comments        Pertinent Vitals/Pain Pain Assessment: No/denies pain    Home Living                      Prior Function            PT Goals (current goals can now be found in the care plan section) Acute Rehab PT Goals Patient Stated Goal: to go home  PT Goal Formulation: With patient Time For Goal Achievement: 10/02/15 Potential to Achieve Goals: Good Progress towards PT goals: Goals met/education completed, patient discharged from PT    Frequency  Min 2X/week    PT Plan Discharge plan needs to be updated;Equipment recommendations need to be updated    Co-evaluation             End of  Session Equipment Utilized During Treatment: Gait belt Activity Tolerance: Patient tolerated treatment well Patient left: in bed;with bed alarm set     Time: 7939-0300 PT Time Calculation (min) (ACUTE ONLY): 15 min  Charges:  $Gait Training: 8-22 mins                    G Codes:      Hartley Wyke Oct 16, 2015, 4:15 PM  Greggory Stallion, PT, DPT [phone]

## 2015-09-20 NOTE — Care Management Important Message (Signed)
Important Message  Patient Details  Name: Alejandro Armstrong MRN: [id number] Date of Birth: 1930/03/06   Medicare Important Message Given:  Yes    Beverly Sessions, RN 09/20/2015, 10:33 AM

## 2015-09-20 NOTE — Care Management (Signed)
PT has recommend home health PT.  Patient states that they only medical equipment that he has in the home is a walking cane.  PT has recommended rolling walker.  Will need order at time of discharge.  Patient is currently open with [facility] for nursing order.  Will need resumption orders at discharge.

## 2015-09-20 NOTE — Progress Notes (Signed)
[place] INFECTIOUS DISEASE PROGRESS NOTE Date of Admission:  09/18/2015     ID: Alejandro Armstrong is a 82 y.o. male with fevers, recent MRSA bacteremia  Active Problems:   Fever chills   Bacteremia   Subjective: Flu test +, still some cough, no fevers  ROS  Eleven systems are reviewed and negative except per hpi  Medications:  Antibiotics Given (last 72 hours)    Date/Time Action Medication Dose Rate   09/18/15 0703 Given   ceFEPIme (MAXIPIME) 2 g in dextrose 5 % 50 mL IVPB 2 g 100 mL/hr   09/19/15 1037 Given   ceFEPIme (MAXIPIME) 2 g in dextrose 5 % 50 mL IVPB 2 g 100 mL/hr   09/19/15 2252 Given   oseltamivir (TAMIFLU) capsule 75 mg 75 mg    09/20/15 0348 Given   vancomycin (VANCOCIN) IVPB 1000 mg/200 mL premix 1,000 mg 200 mL/hr   09/20/15 [id number] Given   oseltamivir (TAMIFLU) capsule 75 mg 75 mg    09/20/15 0938 Given   ceFEPIme (MAXIPIME) 2 g in dextrose 5 % 50 mL IVPB 2 g 100 mL/hr     . alteplase  2 mg Intracatheter Once  . alteplase  2 mg Intracatheter Once  . amLODipine  5 mg Oral Daily  . calcitRIOL  0.25 mcg Oral Daily  . cholecalciferol  1,000 Units Oral Daily  . enoxaparin (LOVENOX) injection  30 mg Subcutaneous Q24H  . feeding supplement (GLUCERNA SHAKE)  237 mL Oral BID BM  . ferrous sulfate  325 mg Oral BID WC  . insulin aspart  0-9 Units Subcutaneous TID WC  . insulin glargine  10 Units Subcutaneous QHS  . ipratropium-albuterol  3 mL Nebulization Q6H  . levothyroxine  125 mcg Oral QAC breakfast  . lisinopril  20 mg Oral Daily  . methylPREDNISolone (SOLU-MEDROL) injection  60 mg Intravenous Q24H  . nystatin  5 mL Oral QID  . oseltamivir  75 mg Oral BID  . sodium bicarbonate  650 mg Oral BID  . sodium chloride flush  3 mL Intravenous Q12H  . vancomycin  1,000 mg Intravenous Q48H  . Warfarin - Pharmacist Dosing Inpatient   Does not apply q1800    Objective: Vital signs in last 24 hours: Temp:  [97.9 F (36.6 C)-98.1 F (36.7 C)] 97.9 F  (36.6 C) (04/06 0609) Pulse Rate:  [66-69] 69 (04/06 0609) Resp:  [18-20] 20 (04/06 0609) BP: (117-121)/(65-66) 121/66 mmHg (04/06 0609) SpO2:  [95 %-96 %] 95 % (04/06 0738) Constitutional: He is oriented to person, place, and time. Frail, chonically ill appearing HENT: anicteric Mouth/Throat: Oropharynx is clear and dry . No oropharyngeal exudate.  Cardiovascular: Normal rate, regular rhythm and normal heart sounds. Exam reveals no gallop and no friction rub. d.  Pulmonary/Chest: Effort normal and breath sounds normal. No respiratory distress. He has no wheezes.  Abdominal: Soft. Bowel sounds are normal. He exhibits no distension. There is no tenderness.  Lymphadenopathy: He has no cervical adenopathy.  Neurological: He is alert and oriented to person, place, and time.  Skin: Skin is warm and dry. No rash noted. No erythema.  R neck line wnl Psychiatric: He has a normal mood and affect. His behavior is normal.   Lab Results  Recent Labs  09/18/15 0231 09/19/15 0450  WBC 5.0 5.0  HGB 9.5* 11.0*  HCT 27.8* 32.4*  NA 136 137  K 4.8 4.8  CL 113* 115*  CO2 19* 16*  BUN 36* 32*  CREATININE 3.06*  3.06*    Microbiology: Results for orders placed or performed during the hospital encounter of 09/18/15  Blood culture (routine x 2)     Status: None (Preliminary result)   Collection Time: 09/18/15  2:31 AM  Result Value Ref Range Status   Specimen Description BLOOD RIGHT HAND  Final   Special Requests BOTTLES DRAWN AEROBIC AND ANAEROBIC 10ML  Final   Culture NO GROWTH 2 DAYS  Final   Report Status PENDING  Incomplete  Blood culture (routine x 2)     Status: None (Preliminary result)   Collection Time: 09/18/15  2:31 AM  Result Value Ref Range Status   Specimen Description BLOOD BLOOD LEFT FOREARM  Final   Special Requests BOTTLES DRAWN AEROBIC AND ANAEROBIC 5ML  Final   Culture NO GROWTH 2 DAYS  Final   Report Status PENDING  Incomplete  Urine culture     Status: None    Collection Time: 09/18/15  4:09 AM  Result Value Ref Range Status   Specimen Description URINE, CLEAN CATCH  Final   Special Requests Normal  Final   Culture NO GROWTH 1 DAY  Final   Report Status 09/19/2015 FINAL  Final    Studies/Results: No results found.  Assessment/Plan: Alejandro Armstrong is a 81 y.o. male ith ITP, CKD who had a recent admission last month for an infected PICC line. The line was noted to be grossly infected at insertion and it was removed however bcx came back + for MRSA so pt was called to be admitted. Wound cx also +.Started on vanco 3/16 x 1 dose. Joanna 3/18 NGTD. Had DVT R arm as well. Dced on 2 weeks IV vanco and was doing well until stopped abx Sunday and then Monday night became febrile and confused. Has had Bcx done and restarted abx. CXR neg, UA with TNTC WBC. WBC was 5.  He was being dosed with vanco q 48 hours and got last dose Sunday so would be unusual for MRSA to flare this quickly. He does have evidence of UTI however, with TNTC WBC and UCX pending. Reports decerased uop but no dysuria, PVR nml Flu B +  Recommendations Can dc vanco and cefepime as all cxs neg Cont tamiflu x 5 days I can see in clinic in 1 week and dc picc line at that time if he remains afebrile and well.  Thank you very much for the consult. Will follow with you.  Sharina Petre P   09/20/2015, 1:32 PM

## 2015-09-20 NOTE — Progress Notes (Signed)
ANTICOAGULATION CONSULT NOTE - Initial Consult  Pharmacy Consult for Warfarin Indication: DVT (hx)  No Known Allergies  Patient Measurements: Height: 5\' 7"  (170.2 cm) Weight: 168 lb 3.2 oz (76.295 kg) IBW/kg (Calculated) : 66.1  Vital Signs: Temp: 97.9 F (36.6 C) (04/06 0609) BP: 121/66 mmHg (04/06 0609) Pulse Rate: 69 (04/06 0609)  Labs:  Recent Labs  09/18/15 0231 09/19/15 0450 09/20/15 1241  HGB 9.5* 11.0*  --   HCT 27.8* 32.4*  --   PLT 123* 107*  --   LABPROT 15.3* 15.0 16.3*  INR 1.19 1.16 1.30  CREATININE 3.06* 3.06*  --     Estimated Creatinine Clearance: 17.1 mL/min (by C-G formula based on Cr of 3.06).   Assessment: 82 yo male on warfarin 3 mg daily at home with subtherapeutic INR on admission. Per rounding MD, pt stated he has been taking warfarin. Patient has been on warfarin for < 1 month for DVT. INR on admission 1.19.  4/4 INR 1.19 5mg  given 4/5 INR 1.16; 4 mg 4/6: INR: 1.30;   Goal of Therapy:  INR 2-3 Monitor platelets by anticoagulation protocol: Yes   Plan:  Will give pt 4mg  tonight. Will follow up with INR in am.   Larene Beach, PharmD  Clinical Pharmacist   09/20/2015,1:39 PM

## 2015-09-20 NOTE — Progress Notes (Signed)
[facility] [place] NAME: Alejandro Armstrong    MR#:  [id number]  DATE OF BIRTH:  [birth year]  SUBJECTIVE:no further fever or SOB.  CHIEF COMPLAINT:   Chief Complaint  Patient presents with  . Fall  . Fever   Admitted for weakness and fever. Recent MRSA bacteremia treated with IV vancomycin for 2 weeks and finished on 4 2017.  Continues to feel weak. Yellow sputum with cough. Wheezing.influenza type B is positve.  REVIEW OF SYSTEMS:    Review of Systems  Constitutional: Negative for fever and chills.  HENT: Negative for sore throat.   Eyes: Negative for blurred vision, double vision and pain.  Respiratory: Negative for cough, hemoptysis, shortness of breath and wheezing.   Cardiovascular: Negative for chest pain, palpitations, orthopnea and leg swelling.  Gastrointestinal: Negative for heartburn, nausea, vomiting, abdominal pain, diarrhea and constipation.  Genitourinary: Negative for dysuria and hematuria.  Musculoskeletal: Negative for back pain and joint pain.  Skin: Negative for rash.  Neurological: Negative for sensory change, speech change, focal weakness and headaches.  Endo/Heme/Allergies: Does not bruise/bleed easily.  Psychiatric/Behavioral: Negative for depression. The patient is not nervous/anxious.     DRUG ALLERGIES:  No Known Allergies  VITALS:  Blood pressure 137/76, pulse 65, temperature 98.9 F (37.2 C), temperature source Oral, resp. rate 18, height 5\' 7"  (1.702 m), weight 76.295 kg (168 lb 3.2 oz), SpO2 97 %.  PHYSICAL EXAMINATION:   Physical Exam  GENERAL:  82 y.o.-year-old patient lying in the bed with no acute distress.  EYES: Pupils equal, round, reactive to light and accommodation. No scleral icterus. Extraocular muscles intact.  HEENT: Head atraumatic, normocephalic. Oropharynx and nasopharynx clear.  NECK:  Supple, no jugular venous distention. No thyroid enlargement, no tenderness.  LUNGS:  Normal work of breathing. Bilateral wheezing. Poor air entry. CARDIOVASCULAR: S1, S2 normal. No murmurs, rubs, or gallops.  ABDOMEN: Soft, nontender, nondistended. Bowel sounds present. No organomegaly or mass.  EXTREMITIES: No cyanosis, clubbing or edema b/l.    NEUROLOGIC: Cranial nerves II through XII are intact. No focal Motor or sensory deficits b/l.   PSYCHIATRIC: The patient is alert and oriented x 3.  SKIN: No obvious rash, lesion, or ulcer.  Right IJ PICC line.  LABORATORY PANEL:   CBC  Recent Labs Lab 09/19/15 0450  WBC 5.0  HGB 11.0*  HCT 32.4*  PLT 107*   ------------------------------------------------------------------------------------------------------------------ Chemistries   Recent Labs Lab 09/18/15 0231 09/19/15 0450  NA 136 137  K 4.8 4.8  CL 113* 115*  CO2 19* 16*  GLUCOSE 133* 99  BUN 36* 32*  CREATININE 3.06* 3.06*  CALCIUM 8.3* 8.0*  AST 23  --   ALT 18  --   ALKPHOS 105  --   BILITOT 0.4  --    ------------------------------------------------------------------------------------------------------------------  Cardiac Enzymes No results for input(s): TROPONINI in the last 168 hours. ------------------------------------------------------------------------------------------------------------------  RADIOLOGY:  No results found.   ASSESSMENT AND PLAN:   * Acute bronchitis;clinically improving -IV steroids - Scheduled Nebulizers  - Influenza type B infection;continue Tamiflu This could be the reason for his fevers has blood cultures are negative.  * Fever due to Influenza type B., Likely due to bronchitis. Need to rule out bacteremia as he recently had MRSA bacteremia and has a PICC line in place. Finish 2 weeks of IV vancomycin on 09/16/2015. Cultures no growth to date  * CKD stage IV is stable  * Diabetes mellitus Sliding scale insulin. Accu-Cheks  before meals and at bedtime  * Hypertension Continue medication  * History of  ITP,follows up with Dr.Finnegan  H/o DVT of right Arm;continue coumadin,pharmacy to dose likley d/c home with home PT am.   All the records are reviewed and case discussed with Care Management/Social Workerr. Management plans discussed with the patient, family and they are in agreement.  CODE STATUS: FULL CODE  DVT Prophylaxis: SCDs  TOTAL TIME TAKING CARE OF THIS PATIENT: 25 minutes.   POSSIBLE D/C IN 1-2 DAYS, DEPENDING ON CLINICAL CONDITION.  Epifanio Lesches M.D on 09/20/2015 at 4:45 PM  Between 7am to 6pm - Pager - [phone]  After 6pm go to [email or web address] - password [email or web address]  Office  [phone]  CC: Primary care physician; Casilda Carls, MD  Note: This dictation was prepared with Dragon dictation along with smaller phrase technology. Any transcriptional errors that result from this process are unintentional.

## 2015-09-21 LAB — PROTIME-INR
INR: 1.43
PROTHROMBIN TIME: 17.5 s — AB (ref 11.4–15.0)

## 2015-09-21 LAB — GLUCOSE, CAPILLARY
Glucose-Capillary: 256 mg/dL — ABNORMAL HIGH (ref 65–99)
Glucose-Capillary: 293 mg/dL — ABNORMAL HIGH (ref 65–99)

## 2015-09-21 MED ORDER — WARFARIN SODIUM 2 MG PO TABS: 6.0000 mg | ORAL_TABLET | Freq: Once | ORAL | Status: DC

## 2015-09-21 MED ORDER — GLUCERNA SHAKE PO LIQD: 237.0000 mL | Freq: Two times a day (BID) | ORAL | Status: DC

## 2015-09-21 MED ORDER — WARFARIN SODIUM 6 MG PO TABS: 6.0000 mg | ORAL_TABLET | Freq: Once | ORAL | Status: DC

## 2015-09-21 MED ORDER — OSELTAMIVIR PHOSPHATE 75 MG PO CAPS: 75.0000 mg | ORAL_CAPSULE | Freq: Two times a day (BID) | ORAL | Status: DC

## 2015-09-21 NOTE — Discharge Summary (Signed)
Alejandro Armstrong, is a 82 y.o. male  DOB [birth year]  MRN [id number].  Admission date:  09/18/2015  Admitting Physician  Saundra Shelling, MD  Discharge Date:  09/21/2015   Primary MD  Casilda Carls, MD  Recommendations for primary care physician for things to follow:   Follow up with PMD in one week Follow up with Dr.Fizgerald in one week,regarding PICC line removal  Admission Diagnosis  Status post PICC central line placement [Z95.828] History of bacteremia [Z86.19] Fever, unspecified fever cause [R50.9]   Discharge Diagnosis  Status post PICC central line placement [Z95.828] History of bacteremia [Z86.19] Fever, unspecified fever cause [R50.9]   Active Problems:   Fever chills   Bacteremia      Past Medical History  Diagnosis Date  . Hyperlipidemia   . Hypertension   . Hypothyroidism   . Type 2 diabetes mellitus ([place])   . Renal agenesis     discovered at age 47  . Recurrent UTI   . Ureteral stricture   . Kidney stones     Past Surgical History  Procedure Laterality Date  . Kidney stone surgery         History of present illness and  Hospital Course:     Kindly see H&P for history of present illness and admission details, please review complete Labs, Consult reports and Test reports for all details in brief  HPI  from the history and physical done on the day of admission  82 yr old male with h/o HTN,HLP,hypothyroidism ,DMII,recent MRSA bacteremia came to ER because of fever 103 F.the patient previous admission was significant for MRSA infection,and he was discharged home with IV vancomycin.pt completed 2 weeks vancomyci n,  Hospital Course   1.Fever;recent bacteremia;this time blood cultures were negative.initially received Vanco and cefepime.both were discontinued as  Per id recommendation,pt  Influenza  B is postive by PCR..started and discharge home with 5days  o f Tamiflu. .did not have further fever.pt will go home with PICC line,Dr.Fitzgerald will remove in  H/o left arm DVT;continue lovenox and coumadin.till  INR more than 2. CKD stage 4;stable   Discharge Condition: stable   Follow UP  Follow-up Information    Follow up with Leonel Ramsay, MD. Daphane Shepherd on 09/25/2015.   Specialty:  Infectious Diseases   Why:  @8 :30am   Contact information:   [place] [id number] [phone]         Discharge Instructions  and  Discharge Medication  Discharge Instructions    Face-to-face encounter (required for Medicare/Medicaid patients)    Complete by:  As directed   I Seleta Hovland certify that this patient is under my care and that I, or a nurse practitioner or physician's assistant working with me, had a face-to-face encounter that meets the physician face-to-face encounter requirements with this patient on 09/21/2015. The encounter with the patient was in whole, or in part for the following medical condition(s) which is the primary reason for home health care \\h /o MRSA bacteremia H/o ITp H/o DVT CKD STAGE 3 Influenza  The encounter with the patient was in whole, or in part, for the following medical condition, which is the primary reason for home health care:  whole  I certify that, based on my findings, the following services are medically necessary home health services:  Nursing  Reason for Medically Necessary Home Health Services:  Skilled Nursing- Assessment and Training for Infusion Therapy, Line Care, and Infection Control  My clinical findings support the  need for the above services:  Unsafe ambulation due to balance issues  Further, I certify that my clinical findings support that this patient is homebound due to:  Unsafe ambulation due to balance issues  Needs PT/INR check on  Monday4/10,fax results to Dr,Finnegan,if INR more than  2,discontinue  Lovenox injections and continue coumadin alone     Home Health    Complete by:  As directed   To provide the following care/treatments:  RN            Medication List    TAKE these medications        amLODipine 5 MG tablet  Commonly known as:  NORVASC  Take 1 tablet (5 mg total) by mouth daily.     calcitRIOL 0.25 MCG capsule  Commonly known as:  ROCALTROL  Take 0.25 mcg by mouth daily.     cholecalciferol 1000 units tablet  Commonly known as:  VITAMIN D  Take 1,000 Units by mouth daily.     enoxaparin 40 MG/0.4ML injection  Commonly known as:  LOVENOX  Inject 0.8 mLs (80 mg total) into the skin daily.     feeding supplement (GLUCERNA SHAKE) Liqd  Take 237 mLs by mouth 2 (two) times daily between meals.     ferrous sulfate 325 (65 FE) MG tablet  Take 325 mg by mouth.     insulin glargine 100 UNIT/ML injection  Commonly known as:  LANTUS  Inject 0.1 mLs (10 Units total) into the skin at bedtime.     levothyroxine 125 MCG tablet  Commonly known as:  SYNTHROID, LEVOTHROID     lisinopril 20 MG tablet  Commonly known as:  PRINIVIL,ZESTRIL  Take 20 mg by mouth.     nystatin 100000 UNIT/ML suspension  Commonly known as:  MYCOSTATIN  Take 5 mLs (500,000 Units total) by mouth 4 (four) times daily.     Omega-3 1000 MG Caps  Take by mouth.     oseltamivir 75 MG capsule  Commonly known as:  TAMIFLU  Take 1 capsule (75 mg total) by mouth 2 (two) times daily.     polyethylene glycol packet  Commonly known as:  MIRALAX / GLYCOLAX  Take 17 g by mouth daily as needed for mild constipation.     sodium bicarbonate 650 MG tablet     warfarin 6 MG tablet  Commonly known as:  COUMADIN  Take 1 tablet (6 mg total) by mouth one time only at 6 PM.          Diet and Activity recommendation: See Discharge Instructions above   Consults obtained -ID  Physical therapy   Major procedures and Radiology Reports - PLEASE review detailed and final reports  for all details, in brief -      Dg Chest 1 View  09/04/2015  CLINICAL DATA:  Status post PICC line placement EXAM: CHEST 1 VIEW COMPARISON:  09/01/2015 FINDINGS: Cardiac shadow is stable. The lungs are well aerated bilaterally. A new right jugular PICC line is noted with the catheter tip in the distal superior vena cava. No pneumothorax is noted. No bony abnormality is seen. IMPRESSION: PICC line in satisfactory position. No other focal abnormality is noted. Electronically Signed   By: Inez Catalina M.D.   On: 09/04/2015 17:26   Dg Chest 2 View  09/18/2015  CLINICAL DATA:  Fever and cough.  Fell today. EXAM: CHEST  2 VIEW COMPARISON:  09/04/2015 FINDINGS: There is a right jugular central line with tip in the  SVC near the azygos vein junction. The lungs are clear, mildly hyperinflated. Pulmonary vasculature is normal. There is no pleural effusion. Hilar, mediastinal and cardiac contours are unremarkable and unchanged. IMPRESSION: Mild hyperinflation.  No acute findings. Electronically Signed   By: Andreas Newport M.D.   On: 09/18/2015 02:59   US Renal  09/02/2015  CLINICAL DATA:  Renal failure EXAM: RENAL / URINARY TRACT ULTRASOUND COMPLETE COMPARISON:  CT 08/06/2015 FINDINGS: Right Kidney: Absent. Left Kidney: Length: 16.3 cm. Echogenic, with parenchymal thinning consistent with atrophy. Lower pole cortical calcifications are present, similar to the appearances on CT. No conclusive collecting system calculi. No hydronephrosis per Bladder: Appears normal for degree of bladder distention. Left ureteral jet observed. IMPRESSION: Absent right kidney. Left kidney appears atrophic with echogenic parenchyma consistent with medical renal disease. No hydronephrosis. Left ureteral jet documented at the urinary bladder. Electronically Signed   By: Andreas Newport M.D.   On: 09/02/2015 01:54   US Venous Img Upper Uni Right  09/03/2015  CLINICAL DATA:  Right upper extremity edema following PICC line infection  with subsequent removal. Evaluate for DVT. EXAM: RIGHT UPPER EXTREMITY VENOUS DOPPLER ULTRASOUND TECHNIQUE: Gray-scale sonography with graded compression, as well as color Doppler and duplex ultrasound were performed to evaluate the upper extremity deep venous system from the level of the subclavian vein and including the jugular, axillary, basilic, radial, ulnar and upper cephalic vein. Spectral Doppler was utilized to evaluate flow at rest and with distal augmentation maneuvers. COMPARISON:  None. FINDINGS: Contralateral Subclavian Vein: Respiratory phasicity is normal and symmetric with the symptomatic side. No evidence of thrombus. Normal compressibility. Internal Jugular Vein: No evidence of thrombus. Normal compressibility, respiratory phasicity and response to augmentation. Subclavian Vein: No evidence of thrombus. Normal compressibility, respiratory phasicity and response to augmentation. Axillary Vein: No evidence of thrombus. Normal compressibility, respiratory phasicity and response to augmentation. Cephalic Vein: No evidence of thrombus. Normal compressibility, respiratory phasicity and response to augmentation. Basilic Vein: There is hypoechoic nonocclusive thrombus within the right basilic vein at the level of the shoulder (representative image 38). Brachial Veins: There is hypoechoic occlusive slightly expansile thrombus within both of the paired right brachial veins (representative image 34). Radial Veins: No evidence of thrombus. Normal compressibility, respiratory phasicity and response to augmentation. Ulnar Veins: No evidence of thrombus. Normal compressibility, respiratory phasicity and response to augmentation. Venous Reflux:  None visualized. Other Findings: No definitive cystic or solid lesions correlate with the patient's area of swelling within the upper arm (representative images 43 and 44). IMPRESSION: 1. Examination is positive for occlusive DVT within both paired right brachial veins.  There is no definitive extension of this DVT to the more central aspect of the right upper extremity venous system. 2. Examination is positive for nonocclusive thrombus within the right basilic vein at the level the shoulder. These results will be called to the ordering clinician or representative by the Radiologist Assistant, and communication documented in the PACS or zVision Dashboard. Electronically Signed   By: Sandi Mariscal M.D.   On: 09/03/2015 15:27   Dg Chest Port 1 View  09/01/2015  CLINICAL DATA:  Recent removal of the PICC line after developing erythema at the site. Now with developing weakness. EXAM: PORTABLE CHEST 1 VIEW COMPARISON:  08/03/2015 FINDINGS: Mildly prominent right hilar contours are likely vascular, accentuated due to the AP portable acquisition. The lungs are clear. Pulmonary vasculature is normal. There is no large effusion. Heart size is upper normal, unchanged. IMPRESSION: No acute cardiopulmonary findings.  Electronically Signed   By: Andreas Newport M.D.   On: 09/01/2015 21:13    Micro Results    Recent Results (from the past 240 hour(s))  Blood culture (routine x 2)     Status: None (Preliminary result)   Collection Time: 09/18/15  2:31 AM  Result Value Ref Range Status   Specimen Description BLOOD RIGHT HAND  Final   Special Requests BOTTLES DRAWN AEROBIC AND ANAEROBIC 10ML  Final   Culture NO GROWTH 3 DAYS  Final   Report Status PENDING  Incomplete  Blood culture (routine x 2)     Status: None (Preliminary result)   Collection Time: 09/18/15  2:31 AM  Result Value Ref Range Status   Specimen Description BLOOD BLOOD LEFT FOREARM  Final   Special Requests BOTTLES DRAWN AEROBIC AND ANAEROBIC 5ML  Final   Culture NO GROWTH 3 DAYS  Final   Report Status PENDING  Incomplete  Urine culture     Status: None   Collection Time: 09/18/15  4:09 AM  Result Value Ref Range Status   Specimen Description URINE, CLEAN CATCH  Final   Special Requests Normal  Final    Culture NO GROWTH 1 DAY  Final   Report Status 09/19/2015 FINAL  Final       Today   Subjective:   Alejandro Armstrong today has no headache,no chest abdominal pain,no new weakness tingling or numbness, feels much better wants to go home today.  Objective:   Blood pressure 140/76, pulse 60, temperature 97.5 F (36.4 C), temperature source Oral, resp. rate 18, height 5\' 7"  (1.702 m), weight 76.295 kg (168 lb 3.2 oz), SpO2 98 %.   Intake/Output Summary (Last 24 hours) at 09/21/15 2242 Last data filed at 09/21/15 1132  Gross per 24 hour  Intake    120 ml  Output    850 ml  Net   -730 ml    Exam Awake Alert, Oriented x 3, No new F.N deficits, Normal affect [place].AT,PERRAL Supple Neck,No JVD, No cervical lymphadenopathy appriciated.  Symmetrical Chest wall movement, Good air movement bilaterally, CTAB RRR,No Gallops,Rubs or new Murmurs, No Parasternal Heave +ve B.Sounds, Abd Soft, Non tender, No organomegaly appriciated, No rebound -guarding or rigidity. No Cyanosis, Clubbing or edema, No new Rash or bruise  Data Review   CBC w Diff: Lab Results  Component Value Date   WBC 5.0 09/19/2015   WBC 6.5 10/15/2012   HGB 11.0* 09/19/2015   HGB 13.7 10/15/2012   HCT 32.4* 09/19/2015   HCT 39.8* 10/15/2012   PLT 107* 09/19/2015   PLT 179 10/15/2012   LYMPHOPCT 33 09/18/2015   MONOPCT 17 09/18/2015   EOSPCT 1 09/18/2015   BASOPCT 1 09/18/2015    CMP: Lab Results  Component Value Date   NA 137 09/19/2015   NA 137 10/15/2012   K 4.8 09/19/2015   K 4.2 10/15/2012   CL 115* 09/19/2015   CL 110* 10/15/2012   CO2 16* 09/19/2015   CO2 16* 10/15/2012   BUN 32* 09/19/2015   BUN 51* 10/15/2012   CREATININE 3.06* 09/19/2015   CREATININE 3.24* 10/15/2012   PROT 5.8* 09/18/2015   PROT 8.7* 10/15/2012   ALBUMIN 2.9* 09/18/2015   ALBUMIN 4.2 10/15/2012   BILITOT 0.4 09/18/2015   BILITOT 0.8 10/15/2012   ALKPHOS 105 09/18/2015   ALKPHOS 168* 10/15/2012   AST 23 09/18/2015    AST 43* 10/15/2012   ALT 18 09/18/2015   ALT 46 10/15/2012  .  Total Time in preparing paper work, data evaluation and todays exam - 42 minutes  Darthy Manganelli M.D on 09/21/2015 at 10:42 PM    Note: This dictation was prepared with Dragon dictation along with smaller phrase technology. Any transcriptional errors that result from this process are unintentional.

## 2015-09-21 NOTE — Progress Notes (Addendum)
ANTICOAGULATION CONSULT NOTE - FOLLOW UP  Pharmacy Consult for Warfarin Indication: DVT (hx)  No Known Allergies  Patient Measurements: Height: 5\' 7"  (170.2 cm) Weight: 168 lb 3.2 oz (76.295 kg) IBW/kg (Calculated) : 66.1  Vital Signs: Temp: 97.5 F (36.4 C) (04/07 0618) BP: 140/76 mmHg (04/07 0618) Pulse Rate: 60 (04/07 0618)  Labs:  Recent Labs  09/19/15 0450 09/20/15 1241 09/21/15 0420  HGB 11.0*  --   --   HCT 32.4*  --   --   PLT 107*  --   --   LABPROT 15.0 16.3* 17.5*  INR 1.16 1.30 1.43  CREATININE 3.06*  --   --     Estimated Creatinine Clearance: 17.1 mL/min (by C-G formula based on Cr of 3.06).   Assessment: 82 yo male on warfarin 3 mg daily at home with subtherapeutic INR on admission. Per rounding MD, pt stated he has been taking warfarin. Patient has been on warfarin for < 1 month for DVT. INR on admission 1.19.  4/4 INR 1.19 5mg  given 4/5 INR 1.16; 4 mg 4/6: INR: 1.30; 4 mg  Goal of Therapy:  INR 2-3 Monitor platelets by anticoagulation protocol: Yes   Plan:  INR still subtherapeutic. Will give boost of warfarin 6 mg PO x 1 dose today and will reassess INR result in am.  Pharmacy to follow per consult.   Larene Beach, PharmD  Clinical Pharmacist   09/21/2015,10:40 AM

## 2015-09-21 NOTE — Progress Notes (Signed)
Pt stable. IV removed. D/c instructions given and education provided. Electronic prescriptions verified and given. Pt states he understands instructions. Pt dressed and escorted out by staff. Driven home by family.

## 2015-09-21 NOTE — Progress Notes (Signed)
Inpatient Diabetes Program Recommendations  AACE/ADA: New Consensus Statement on Inpatient Glycemic Control (2015)  Target Ranges:  Prepandial:   less than 140 mg/dL      Peak postprandial:   less than 180 mg/dL (1-2 hours)      Critically ill patients:  140 - 180 mg/dL   Review of Glycemic Control  Results for AMALIO, CORVI (MRN [id number]) as of 09/21/2015 11:42  Ref. Range 09/20/2015 12:10 09/20/2015 17:03 09/20/2015 21:48 09/21/2015 07:41 09/21/2015 11:30  Glucose-Capillary Latest Ref Range: 65-99 mg/dL 244 (H) 308 (H) 379 (H) 256 (H) 293 (H)    Diabetes history: Type 2 Outpatient Diabetes medications: Lantus 10 units qhs Current orders for Inpatient glycemic control: Lantus qhs, Novolog 0-9 units tid  **Prednisone q24hours  Inpatient Diabetes Program Recommendations:  A1C 7.2% on 09/02/15 Poor kidney function- Consider increasing Lantus to 15 units qhs, increase Novolog correction to moderate 0-15 units correction scale - taper when steroids are decreased. Decrease Lantus back to 10 units qday when steroids are stopped. Gentry Fitz, RN, BA, MHA, CDE Diabetes Coordinator Inpatient Diabetes Program  [phone] (Team Pager) [phone] ([place]) 09/21/2015 11:48 AM

## 2015-09-21 NOTE — Care Management (Signed)
Patient to discharge home today.  Patient will no longer require home IV antibiotics.  Patient will be discharged with PICC line in place to be pulled by Dr. Ola Spurr in 1 week.  MD to place resumption of care orders for home health RN.  [place] from [facility] notified of discharge plan.  Corene Cornea from [facility] notified that there is no longer a need for infusion.  PT has signed of on patient and no longer recommend home health PT.  RNCM signing off.

## 2015-09-21 NOTE — Progress Notes (Signed)
[place] [facility] [place] NAME: Alejandro Armstrong    MR#:  [id number]  DATE OF BIRTH:  Dec 12, 1929  SUBJECTIVE:no further fever or SOB .for discharge home today.  CHIEF COMPLAINT:   Chief Complaint  Patient presents with  . Fall  . Fever   Admitted for weakness and fever. Recent MRSA bacteremia treated with IV vancomycin for 2 weeks and finished on 4 2017.  Overall feels betetr. .influenza type B is positve.  REVIEW OF SYSTEMS:    Review of Systems  Constitutional: Negative for fever and chills.  HENT: Negative for sore throat.   Eyes: Negative for blurred vision, double vision and pain.  Respiratory: Negative for cough, hemoptysis, shortness of breath and wheezing.   Cardiovascular: Negative for chest pain, palpitations, orthopnea and leg swelling.  Gastrointestinal: Negative for heartburn, nausea, vomiting, abdominal pain, diarrhea and constipation.  Genitourinary: Negative for dysuria and hematuria.  Musculoskeletal: Negative for back pain and joint pain.  Skin: Negative for rash.  Neurological: Negative for sensory change, speech change, focal weakness and headaches.  Endo/Heme/Allergies: Does not bruise/bleed easily.  Psychiatric/Behavioral: Negative for depression. The patient is not nervous/anxious.     DRUG ALLERGIES:  No Known Allergies  VITALS:  Blood pressure 140/76, pulse 60, temperature 97.5 F (36.4 C), temperature source Oral, resp. rate 18, height 5\' 7"  (1.702 m), weight 76.295 kg (168 lb 3.2 oz), SpO2 98 %.  PHYSICAL EXAMINATION:   Physical Exam  GENERAL:  82 y.o.-year-old patient lying in the bed with no acute distress.  EYES: Pupils equal, round, reactive to light and accommodation. No scleral icterus. Extraocular muscles intact.  HEENT: Head atraumatic, normocephalic. Oropharynx and nasopharynx clear.  NECK:  Supple, no jugular venous distention. No thyroid enlargement, no tenderness.  LUNGS: Normal work  of breathing. Clear to ausculation . CARDIOVASCULAR: S1, S2 normal. No murmurs, rubs, or gallops.  ABDOMEN: Soft, nontender, nondistended. Bowel sounds present. No organomegaly or mass.  EXTREMITIES: No cyanosis, clubbing or edema b/l.    NEUROLOGIC: Cranial nerves II through XII are intact. No focal Motor or sensory deficits b/l.   PSYCHIATRIC: The patient is alert and oriented x 3.  SKIN: No obvious rash, lesion, or ulcer.  Right IJ PICC line.  LABORATORY PANEL:   CBC  Recent Labs Lab 09/19/15 0450  WBC 5.0  HGB 11.0*  HCT 32.4*  PLT 107*   ------------------------------------------------------------------------------------------------------------------ Chemistries   Recent Labs Lab 09/18/15 0231 09/19/15 0450  NA 136 137  K 4.8 4.8  CL 113* 115*  CO2 19* 16*  GLUCOSE 133* 99  BUN 36* 32*  CREATININE 3.06* 3.06*  CALCIUM 8.3* 8.0*  AST 23  --   ALT 18  --   ALKPHOS 105  --   BILITOT 0.4  --    ------------------------------------------------------------------------------------------------------------------  Cardiac Enzymes No results for input(s): TROPONINI in the last 168 hours. ------------------------------------------------------------------------------------------------------------------  RADIOLOGY:  No results found.   ASSESSMENT AND PLAN:   * Acute bronchitis;clinically improving Discharge home with po prednisone - Scheduled Nebulizers  - Influenza type B infection;continue Tamiflu This could be the reason for his fevers has blood cultures are negative.  * Fever due to Influenza type B.,discharge home with Tamiflu; Likely due to bronchitis. Need to rule out bacteremia as he recently had MRSA bacteremia and has a PICC line in place. Finish 2 weeks of IV vancomycin on 09/16/2015. Cultures no growth to date Spoke to DR.Fitzerald and he wants to keep PICC line for  one week and  He will follow blood cultures and remove PICC line  In one week.so  discharge pt with PICC line  * CKD stage IV is stable  * Diabetes mellitus Sliding scale insulin. Accu-Cheks before meals and at bedtime  * Hypertension Continue medication  * History of ITP,follows up with Dr.Finnegan  H/o DVT of right Arm;continue  lovenox and coumadin,continue  lovenox when INR more than 2. D/c home today with Tamiflu   All the records are reviewed and case discussed with Care Management/Social Workerr. Management plans discussed with the patient, family and they are in agreement.  CODE STATUS: FULL CODE  DVT Prophylaxis: SCDs  TOTAL TIME TAKING CARE OF THIS PATIENT: 25 minutes.   POSSIBLE D/C IN 1-2 DAYS, DEPENDING ON CLINICAL CONDITION.  Epifanio Lesches M.D on 09/21/2015 at 12:54 PM  Between 7am to 6pm - Pager - [phone]  After 6pm go to [email or web address] - password [email or web address] [facility]  Office  [phone]  CC: Primary care physician; Casilda Carls, MD  Note: This dictation was prepared with Dragon dictation along with smaller phrase technology. Any transcriptional errors that result from this process are unintentional.

## 2015-09-23 LAB — CULTURE, BLOOD (ROUTINE X 2)
Culture: NO GROWTH
Culture: NO GROWTH

## 2015-09-24 ENCOUNTER — Inpatient Hospital Stay (HOSPITAL_BASED_OUTPATIENT_CLINIC_OR_DEPARTMENT_OTHER): Payer: Medicare HMO | Admitting: Oncology

## 2015-09-24 ENCOUNTER — Inpatient Hospital Stay: Payer: Medicare HMO | Attending: Family Medicine

## 2015-09-24 VITALS — BP 131/83 | HR 88 | Temp 97.1°F | Resp 20 | Wt 169.8 lb

## 2015-09-24 DIAGNOSIS — E039 Hypothyroidism, unspecified: Secondary | ICD-10-CM | POA: Diagnosis not present

## 2015-09-24 DIAGNOSIS — Z79899 Other long term (current) drug therapy: Secondary | ICD-10-CM

## 2015-09-24 DIAGNOSIS — E785 Hyperlipidemia, unspecified: Secondary | ICD-10-CM | POA: Diagnosis not present

## 2015-09-24 DIAGNOSIS — D693 Immune thrombocytopenic purpura: Secondary | ICD-10-CM

## 2015-09-24 DIAGNOSIS — R5383 Other fatigue: Secondary | ICD-10-CM

## 2015-09-24 DIAGNOSIS — N189 Chronic kidney disease, unspecified: Secondary | ICD-10-CM | POA: Insufficient documentation

## 2015-09-24 DIAGNOSIS — Z86718 Personal history of other venous thrombosis and embolism: Secondary | ICD-10-CM

## 2015-09-24 DIAGNOSIS — Q602 Renal agenesis, unspecified: Secondary | ICD-10-CM | POA: Insufficient documentation

## 2015-09-24 DIAGNOSIS — Z8614 Personal history of Methicillin resistant Staphylococcus aureus infection: Secondary | ICD-10-CM | POA: Diagnosis not present

## 2015-09-24 DIAGNOSIS — E538 Deficiency of other specified B group vitamins: Secondary | ICD-10-CM | POA: Insufficient documentation

## 2015-09-24 DIAGNOSIS — Z794 Long term (current) use of insulin: Secondary | ICD-10-CM | POA: Insufficient documentation

## 2015-09-24 DIAGNOSIS — D649 Anemia, unspecified: Secondary | ICD-10-CM | POA: Diagnosis not present

## 2015-09-24 DIAGNOSIS — E1165 Type 2 diabetes mellitus with hyperglycemia: Secondary | ICD-10-CM | POA: Insufficient documentation

## 2015-09-24 DIAGNOSIS — R531 Weakness: Secondary | ICD-10-CM | POA: Insufficient documentation

## 2015-09-24 DIAGNOSIS — I129 Hypertensive chronic kidney disease with stage 1 through stage 4 chronic kidney disease, or unspecified chronic kidney disease: Secondary | ICD-10-CM | POA: Diagnosis not present

## 2015-09-24 DIAGNOSIS — Z7901 Long term (current) use of anticoagulants: Secondary | ICD-10-CM | POA: Insufficient documentation

## 2015-09-24 LAB — CBC WITH DIFFERENTIAL/PLATELET
Basophils Absolute: 0 10*3/uL (ref 0–0.1)
Basophils Relative: 0 %
EOS ABS: 0.1 10*3/uL (ref 0–0.7)
Eosinophils Relative: 1 %
HEMATOCRIT: 34.2 % — AB (ref 40.0–52.0)
HEMOGLOBIN: 12 g/dL — AB (ref 13.0–18.0)
LYMPHS ABS: 2.2 10*3/uL (ref 1.0–3.6)
Lymphocytes Relative: 41 %
MCH: 32.1 pg (ref 26.0–34.0)
MCHC: 35 g/dL (ref 32.0–36.0)
MCV: 91.5 fL (ref 80.0–100.0)
MONO ABS: 0.7 10*3/uL (ref 0.2–1.0)
MONOS PCT: 14 %
NEUTROS ABS: 2.4 10*3/uL (ref 1.4–6.5)
NEUTROS PCT: 44 %
Platelets: 175 10*3/uL (ref 150–440)
RBC: 3.74 MIL/uL — ABNORMAL LOW (ref 4.40–5.90)
RDW: 15.3 % — AB (ref 11.5–14.5)
WBC: 5.3 10*3/uL (ref 3.8–10.6)

## 2015-09-24 LAB — PROTIME-INR
INR: 2.08
Prothrombin Time: 23.2 seconds — ABNORMAL HIGH (ref 11.4–15.0)

## 2015-09-24 NOTE — Progress Notes (Signed)
[place]  [phone] Fax:([phone]  ID: Alejandro Armstrong OB: 07-Sep-1929  MR#: [id number]  ZDG#:[id number]  Patient Care Team: Casilda Carls, MD as PCP - General (Internal Medicine)  CHIEF COMPLAINT:  Chief Complaint  Patient presents with  . ITP    INTERVAL HISTORY: Patient returns to clinic today for further evaluation and laboratory work. Since his last clinic visit he has been in the hospital multiple times for MRSA sepsis secondary to infected PICC line, DVT, and influenza. He currently feels weak and fatigued and is not back to his baseline, but admits he is improved. He denies any fevers. He has no neurologic complaints. He denies any easy bleeding or bruising. He denies any chest pain or shortness of breath. He denies any nausea, vomiting, constipation, or diarrhea. He has no urinary complaints. Patient offers no further specific complaints.   REVIEW OF SYSTEMS:   Review of Systems  Constitutional: Positive for malaise/fatigue. Negative for fever and weight loss.  Respiratory: Negative.  Negative for cough and shortness of breath.   Cardiovascular: Negative.  Negative for chest pain.  Gastrointestinal: Negative.  Negative for blood in stool and melena.  Genitourinary: Negative.   Musculoskeletal: Negative.   Neurological: Negative.  Negative for headaches.  Endo/Heme/Allergies: Bruises/bleeds easily.  Psychiatric/Behavioral: Negative.     As per HPI. Otherwise, a complete review of systems is negatve.  PAST MEDICAL HISTORY: Past Medical History  Diagnosis Date  . Hyperlipidemia   . Hypertension   . Hypothyroidism   . Type 2 diabetes mellitus ([place])   . Renal agenesis     discovered at age 74  . Recurrent UTI   . Ureteral stricture   . Kidney stones     PAST SURGICAL HISTORY: Past Surgical History  Procedure Laterality Date  . Kidney stone surgery      FAMILY HISTORY: No reported history of malignancy or chronic  disease.      ADVANCED DIRECTIVES:    HEALTH MAINTENANCE: Social History  Substance Use Topics  . Smoking status: Never Smoker   . Smokeless tobacco: Never Used  . Alcohol Use: No     Colonoscopy:  PAP:  Bone density:  Lipid panel:  No Known Allergies  Current Outpatient Prescriptions  Medication Sig Dispense Refill  . amLODipine (NORVASC) 5 MG tablet Take 1 tablet (5 mg total) by mouth daily. 30 tablet 0  . calcitRIOL (ROCALTROL) 0.25 MCG capsule Take 0.25 mcg by mouth daily.    . cholecalciferol (VITAMIN D) 1000 units tablet Take 1,000 Units by mouth daily.    [name] enoxaparin (LOVENOX) 40 MG/0.4ML injection Inject 0.8 mLs (80 mg total) into the skin daily. 8 Syringe 0  . feeding supplement, GLUCERNA SHAKE, (GLUCERNA SHAKE) LIQD Take 237 mLs by mouth 2 (two) times daily between meals. 30 Can 0  . ferrous sulfate 325 (65 FE) MG tablet Take 325 mg by mouth.    . insulin glargine (LANTUS) 100 UNIT/ML injection Inject 0.1 mLs (10 Units total) into the skin at bedtime. 10 mL 11  . levothyroxine (SYNTHROID, LEVOTHROID) 125 MCG tablet     . lisinopril (PRINIVIL,ZESTRIL) 20 MG tablet Take 20 mg by mouth.    . nystatin (MYCOSTATIN) 100000 UNIT/ML suspension Take 5 mLs (500,000 Units total) by mouth 4 (four) times daily. 60 mL 1  . Omega-3 1000 MG CAPS Take by mouth.    . polyethylene glycol (MIRALAX / GLYCOLAX) packet Take 17 g by mouth daily as needed for mild constipation. Basye  each 0  . sodium bicarbonate 650 MG tablet     . warfarin (COUMADIN) 6 MG tablet Take 1 tablet (6 mg total) by mouth one time only at 6 PM. 30 tablet 0   No current facility-administered medications for this visit.   Facility-Administered Medications Ordered in Other Visits  Medication Dose Route Frequency Provider Last Rate Last Dose  . heparin lock flush 100 unit/mL  500 Units Intravenous Once [name], MD      . sodium chloride flush (NS) 0.9 % injection 10 mL  10 mL Intravenous PRN [name], MD      . sodium chloride flush (NS) 0.9 % injection 10 mL  10 mL Intravenous PRN [name], MD   10 mL at 08/16/15 0857    OBJECTIVE: Filed Vitals:   09/24/15 1503  BP: 131/83  Pulse: 88  Temp: 97.1 F (36.2 C)  Resp: 20     Body mass index is 26.58 kg/(m^2).    ECOG FS:0 - Asymptomatic  General: Well-developed, well-nourished, no acute distress. Eyes: Pink conjunctiva, anicteric sclera. Lungs: Clear to auscultation bilaterally. Heart: Regular rate and rhythm. No rubs, murmurs, or gallops. Abdomen: Soft, nontender. No organomegaly noted, normoactive bowel sounds. Ventral hernia noted. Musculoskeletal: Bilateral lower extremity edema 1+ pitting, no cyanosis, or clubbing. Neuro: Alert, answering all questions appropriately. Cranial nerves grossly intact. Skin: Mild bruising noted. Psych: Normal affect.   LAB RESULTS:  Lab Results  Component Value Date   NA 137 09/19/2015   K 4.8 09/19/2015   CL 115* 09/19/2015   CO2 16* 09/19/2015   GLUCOSE 99 09/19/2015   BUN 32* 09/19/2015   CREATININE 3.06* 09/19/2015   CALCIUM 8.0* 09/19/2015   PROT 5.8* 09/18/2015   ALBUMIN 2.9* 09/18/2015   AST 23 09/18/2015   ALT 18 09/18/2015   ALKPHOS 105 09/18/2015   BILITOT 0.4 09/18/2015   GFRNONAA 17* 09/19/2015   GFRAA 20* 09/19/2015    Lab Results  Component Value Date   WBC 5.3 09/24/2015   NEUTROABS 2.4 09/24/2015   HGB 12.0* 09/24/2015   HCT 34.2* 09/24/2015   MCV 91.5 09/24/2015   PLT 175 09/24/2015     STUDIES: Dg Chest 1 View  09/04/2015  CLINICAL DATA:  Status post PICC line placement EXAM: CHEST 1 VIEW COMPARISON:  09/01/2015 FINDINGS: Cardiac shadow is stable. The lungs are well aerated bilaterally. A new right jugular PICC line is noted with the catheter tip in the distal superior vena cava. No pneumothorax is noted. No bony abnormality is seen. IMPRESSION: PICC line in satisfactory position. No other focal abnormality is noted. Electronically  Signed   By: Inez Catalina M.D.   On: 09/04/2015 17:26   Dg Chest 2 View  09/18/2015  CLINICAL DATA:  Fever and cough.  Fell today. EXAM: CHEST  2 VIEW COMPARISON:  09/04/2015 FINDINGS: There is a right jugular central line with tip in the SVC near the azygos vein junction. The lungs are clear, mildly hyperinflated. Pulmonary vasculature is normal. There is no pleural effusion. Hilar, mediastinal and cardiac contours are unremarkable and unchanged. IMPRESSION: Mild hyperinflation.  No acute findings. Electronically Signed   By: Andreas Newport M.D.   On: 09/18/2015 02:59   US Renal  09/02/2015  CLINICAL DATA:  Renal failure EXAM: RENAL / URINARY TRACT ULTRASOUND COMPLETE COMPARISON:  CT 08/06/2015 FINDINGS: Right Kidney: Absent. Left Kidney: Length: 16.3 cm. Echogenic, with parenchymal thinning consistent with atrophy. Lower pole cortical calcifications are present,  similar to the appearances on CT. No conclusive collecting system calculi. No hydronephrosis per Bladder: Appears normal for degree of bladder distention. Left ureteral jet observed. IMPRESSION: Absent right kidney. Left kidney appears atrophic with echogenic parenchyma consistent with medical renal disease. No hydronephrosis. Left ureteral jet documented at the urinary bladder. Electronically Signed   By: Andreas Newport M.D.   On: 09/02/2015 01:54   US Venous Img Upper Uni Right  09/03/2015  CLINICAL DATA:  Right upper extremity edema following PICC line infection with subsequent removal. Evaluate for DVT. EXAM: RIGHT UPPER EXTREMITY VENOUS DOPPLER ULTRASOUND TECHNIQUE: Gray-scale sonography with graded compression, as well as color Doppler and duplex ultrasound were performed to evaluate the upper extremity deep venous system from the level of the subclavian vein and including the jugular, axillary, basilic, radial, ulnar and upper cephalic vein. Spectral Doppler was utilized to evaluate flow at rest and with distal augmentation maneuvers.  COMPARISON:  None. FINDINGS: Contralateral Subclavian Vein: Respiratory phasicity is normal and symmetric with the symptomatic side. No evidence of thrombus. Normal compressibility. Internal Jugular Vein: No evidence of thrombus. Normal compressibility, respiratory phasicity and response to augmentation. Subclavian Vein: No evidence of thrombus. Normal compressibility, respiratory phasicity and response to augmentation. Axillary Vein: No evidence of thrombus. Normal compressibility, respiratory phasicity and response to augmentation. Cephalic Vein: No evidence of thrombus. Normal compressibility, respiratory phasicity and response to augmentation. Basilic Vein: There is hypoechoic nonocclusive thrombus within the right basilic vein at the level of the shoulder (representative image 38). Brachial Veins: There is hypoechoic occlusive slightly expansile thrombus within both of the paired right brachial veins (representative image 34). Radial Veins: No evidence of thrombus. Normal compressibility, respiratory phasicity and response to augmentation. Ulnar Veins: No evidence of thrombus. Normal compressibility, respiratory phasicity and response to augmentation. Venous Reflux:  None visualized. Other Findings: No definitive cystic or solid lesions correlate with the patient's area of swelling within the upper arm (representative images 43 and 44). IMPRESSION: 1. Examination is positive for occlusive DVT within both paired right brachial veins. There is no definitive extension of this DVT to the more central aspect of the right upper extremity venous system. 2. Examination is positive for nonocclusive thrombus within the right basilic vein at the level the shoulder. These results will be called to the ordering clinician or representative by the Radiologist Assistant, and communication documented in the PACS or zVision Dashboard. Electronically Signed   By: Sandi Mariscal M.D.   On: 09/03/2015 15:27   Dg Chest Port 1  View  09/01/2015  CLINICAL DATA:  Recent removal of the PICC line after developing erythema at the site. Now with developing weakness. EXAM: PORTABLE CHEST 1 VIEW COMPARISON:  08/03/2015 FINDINGS: Mildly prominent right hilar contours are likely vascular, accentuated due to the AP portable acquisition. The lungs are clear. Pulmonary vasculature is normal. There is no large effusion. Heart size is upper normal, unchanged. IMPRESSION: No acute cardiopulmonary findings. Electronically Signed   By: Andreas Newport M.D.   On: 09/01/2015 21:13    ASSESSMENT:  Bone marrow biopsy confirmed ITP.  PLAN:    1. ITP: Bone marrow biopsy revealed normal megakaryocytes as well as normal trilineage hematopoiesis thus suggesting peripheral destruction of his platelets.  Infectious workup did not reveal an underlying viral etiology. Patient completed 4 weekly cycles of Rituxan on August 23, 2015.  His platelets are now within normal limits. No further intervention is needed at this time. Return to clinic in 3 months with repeat laboratory  work and further evaluation.  2. Chronic renal insufficiency: Continue follow-up with nephrology as scheduled. 3. Hyperglycemia: Secondary to high dose prednisone. Monitor. Taper prednisone as above. 4. DVT: Patient's INR is therapeutic at 2.08. Continue 6 mg Coumadin per day. Continue to monitor weekly INR.  5. Anemia: B12 deficiency noted on 07/24/2015. Improving, monitor. 6. MRSA bacteremia: Treatment per infectious disease. 7. Venous access: Patient has a right IJ catheter in. We will further discuss with infectious disease this can be removed.   Patient expressed understanding and was in agreement with this plan. He also understands that he can call clinic at any time with any questions, concerns, or complaints.   [name], MD   09/24/2015 3:29 PM

## 2015-09-24 NOTE — Progress Notes (Signed)
Since his last visit patient has been diagnosed with DVT and started on Coumadin.  Also was hospitalized for MRSA treatment the PICC line in his arm has been removed and a PICC line in his neck has been placed.

## 2015-09-25 ENCOUNTER — Encounter: Payer: Self-pay | Admitting: *Deleted

## 2015-10-01 ENCOUNTER — Inpatient Hospital Stay: Payer: Medicare HMO

## 2015-10-01 DIAGNOSIS — D693 Immune thrombocytopenic purpura: Secondary | ICD-10-CM | POA: Diagnosis not present

## 2015-10-01 DIAGNOSIS — Z7901 Long term (current) use of anticoagulants: Secondary | ICD-10-CM

## 2015-10-01 LAB — PROTIME-INR
INR: 2.6
PROTHROMBIN TIME: 27.5 s — AB (ref 11.4–15.0)

## 2015-10-08 ENCOUNTER — Inpatient Hospital Stay: Payer: Medicare HMO

## 2015-10-08 DIAGNOSIS — D693 Immune thrombocytopenic purpura: Secondary | ICD-10-CM | POA: Diagnosis not present

## 2015-10-08 DIAGNOSIS — Z7901 Long term (current) use of anticoagulants: Secondary | ICD-10-CM

## 2015-10-08 LAB — PROTIME-INR
INR: 3.48
PROTHROMBIN TIME: 34.2 s — AB (ref 11.4–15.0)

## 2015-10-09 ENCOUNTER — Encounter: Payer: Self-pay | Admitting: *Deleted

## 2015-10-10 NOTE — Addendum Note (Signed)
Addended by: [name] on: 10/10/2015 01:13 PM   Modules accepted: Level of Service

## 2015-10-15 ENCOUNTER — Inpatient Hospital Stay: Payer: Medicare HMO | Attending: Oncology

## 2015-10-15 ENCOUNTER — Telehealth: Payer: Self-pay | Admitting: *Deleted

## 2015-10-15 DIAGNOSIS — D693 Immune thrombocytopenic purpura: Secondary | ICD-10-CM | POA: Diagnosis present

## 2015-10-15 DIAGNOSIS — Z7901 Long term (current) use of anticoagulants: Secondary | ICD-10-CM

## 2015-10-15 LAB — PROTIME-INR
INR: 1.43
INR: 1.4 — AB (ref 0.9–1.1)
PROTHROMBIN TIME: 17.5 s — AB (ref 11.4–15.0)

## 2015-10-15 NOTE — Telephone Encounter (Signed)
See Flow Sheet

## 2015-10-22 ENCOUNTER — Encounter: Payer: Self-pay | Admitting: Oncology

## 2015-10-22 ENCOUNTER — Inpatient Hospital Stay: Payer: Medicare HMO

## 2015-10-22 DIAGNOSIS — D693 Immune thrombocytopenic purpura: Secondary | ICD-10-CM | POA: Diagnosis not present

## 2015-10-22 DIAGNOSIS — Z7901 Long term (current) use of anticoagulants: Secondary | ICD-10-CM

## 2015-10-22 LAB — PROTIME-INR
INR: 1.51
INR: 1.5 — AB (ref 0.9–1.1)
Prothrombin Time: 18.3 seconds — ABNORMAL HIGH (ref 11.4–15.0)

## 2015-10-23 ENCOUNTER — Other Ambulatory Visit: Payer: Self-pay

## 2015-10-23 MED ORDER — WARFARIN SODIUM 6 MG PO TABS: 6.0000 mg | ORAL_TABLET | Freq: Every day | ORAL | Status: DC

## 2015-10-29 ENCOUNTER — Inpatient Hospital Stay: Payer: Medicare HMO

## 2015-10-29 DIAGNOSIS — Z7901 Long term (current) use of anticoagulants: Secondary | ICD-10-CM

## 2015-10-29 DIAGNOSIS — D693 Immune thrombocytopenic purpura: Secondary | ICD-10-CM | POA: Diagnosis not present

## 2015-10-29 LAB — PROTIME-INR
INR: 2.21
Prothrombin Time: 24.3 seconds — ABNORMAL HIGH (ref 11.4–15.0)

## 2015-10-30 ENCOUNTER — Telehealth: Payer: Self-pay | Admitting: *Deleted

## 2015-10-30 LAB — PROTIME-INR: INR: 2.2 — AB (ref 0.9–1.1)

## 2015-10-30 NOTE — Telephone Encounter (Signed)
Wife informed of no change and to continue 6 mg daily, she repeated this back to me

## 2015-10-30 NOTE — Telephone Encounter (Signed)
Per Dr Grayland Ormond, no change in coumadin dose

## 2015-10-30 NOTE — Telephone Encounter (Signed)
Asking what he is to do with his med as results of labs yesterday     Ref Range 1d ago (10/29/15) 8d ago (10/22/15)   8d ago (10/22/15) 2wk ago (10/15/15)    Prothrombin Time 11.4 - 15.0 seconds 24.3 (H)  18.3 (H) 17.5 (H)    INR  2.21 1.5 (A) 1.51 1.43

## 2015-11-05 ENCOUNTER — Inpatient Hospital Stay: Payer: Medicare HMO

## 2015-11-05 DIAGNOSIS — Z7901 Long term (current) use of anticoagulants: Secondary | ICD-10-CM

## 2015-11-05 DIAGNOSIS — D693 Immune thrombocytopenic purpura: Secondary | ICD-10-CM | POA: Diagnosis not present

## 2015-11-05 LAB — PROTIME-INR
INR: 2.15
Prothrombin Time: 23.8 seconds — ABNORMAL HIGH (ref 11.4–15.0)

## 2015-11-06 ENCOUNTER — Telehealth: Payer: Self-pay | Admitting: *Deleted

## 2015-11-06 NOTE — Telephone Encounter (Signed)
No changes

## 2015-11-06 NOTE — Telephone Encounter (Signed)
I spoke with patient and informed him no changes in warfarin dose. He repeated this back to me

## 2015-11-06 NOTE — Telephone Encounter (Signed)
Asking what to do regarding his coumadin  Protime-INR  Status: Finalresult Visible to patient:  Not Released Nextappt: 11/19/2015 at 02:45 PM in Oncology ([facility]) Dx:  On continuous oral anticoagulation              Ref Range 1d ago (11/05/15) 8d ago (10/29/15) 8d ago (10/29/15) 2wk ago (10/22/15) 2wk ago (10/22/15)    Prothrombin Time 11.4 - 15.0 seconds 23.8 (H)  24.3 (H)  18.3 (H)    INR  2.15 2.2 (A) 2.21 1.5 (A) 1.51

## 2015-11-19 ENCOUNTER — Inpatient Hospital Stay: Payer: Medicare HMO | Attending: Oncology

## 2015-11-19 DIAGNOSIS — D693 Immune thrombocytopenic purpura: Secondary | ICD-10-CM | POA: Insufficient documentation

## 2015-11-19 DIAGNOSIS — Z7901 Long term (current) use of anticoagulants: Secondary | ICD-10-CM

## 2015-11-19 LAB — PROTIME-INR
INR: 2.35
PROTHROMBIN TIME: 25.5 s — AB (ref 11.4–15.0)

## 2015-11-20 ENCOUNTER — Telehealth: Payer: Self-pay | Admitting: *Deleted

## 2015-11-20 LAB — INR (THROMBOREL-S): INR: 2.4

## 2015-11-20 MED ORDER — WARFARIN SODIUM 6 MG PO TABS: 6.0000 mg | ORAL_TABLET | Freq: Every day | ORAL | Status: DC

## 2015-11-20 NOTE — Telephone Encounter (Signed)
Per Dr Grayland Ormond, no change in coumadin. Wife notified, and she asked for refill to [facility] on [address]

## 2015-11-26 ENCOUNTER — Inpatient Hospital Stay: Payer: Medicare HMO

## 2015-11-26 DIAGNOSIS — Z7901 Long term (current) use of anticoagulants: Secondary | ICD-10-CM

## 2015-11-26 DIAGNOSIS — D693 Immune thrombocytopenic purpura: Secondary | ICD-10-CM | POA: Diagnosis not present

## 2015-11-26 LAB — PROTIME-INR
INR: 2.69
Prothrombin Time: 28.2 seconds — ABNORMAL HIGH (ref 11.4–15.0)

## 2015-11-27 ENCOUNTER — Telehealth: Payer: Self-pay | Admitting: *Deleted

## 2015-11-27 NOTE — Telephone Encounter (Signed)
No change in coumadin. If INR between 2.0-3.0 continue current dose as prescribed.  Thank you.

## 2015-11-28 LAB — INR (THROMBOREL-S): INR: 2.7

## 2015-11-28 NOTE — Telephone Encounter (Signed)
Informed Alejandro Armstrong of no change in dose. She repeated back to me and thanked me for calling

## 2015-12-03 ENCOUNTER — Inpatient Hospital Stay: Payer: Medicare HMO

## 2015-12-03 DIAGNOSIS — D693 Immune thrombocytopenic purpura: Secondary | ICD-10-CM | POA: Diagnosis not present

## 2015-12-03 DIAGNOSIS — Z7901 Long term (current) use of anticoagulants: Secondary | ICD-10-CM

## 2015-12-03 LAB — PROTIME-INR
INR: 2.06
Prothrombin Time: 23.1 seconds — ABNORMAL HIGH (ref 11.4–15.0)

## 2015-12-04 ENCOUNTER — Telehealth: Payer: Self-pay | Admitting: *Deleted

## 2015-12-04 LAB — PROTHROMBIN TIME + INR: INR: 2.1

## 2015-12-04 NOTE — Telephone Encounter (Signed)
Informed of results INR 2.06 and no changes in dose, continue 6 mg daily. She repeated back to me

## 2015-12-10 ENCOUNTER — Telehealth: Payer: Self-pay | Admitting: *Deleted

## 2015-12-10 ENCOUNTER — Inpatient Hospital Stay: Payer: Medicare HMO

## 2015-12-10 DIAGNOSIS — Z7901 Long term (current) use of anticoagulants: Secondary | ICD-10-CM

## 2015-12-10 DIAGNOSIS — D693 Immune thrombocytopenic purpura: Secondary | ICD-10-CM | POA: Diagnosis not present

## 2015-12-10 LAB — PROTIME-INR
INR: 2.43
PROTHROMBIN TIME: 26.1 s — AB (ref 11.4–15.0)

## 2015-12-10 NOTE — Telephone Encounter (Signed)
Per Dr. Grayland Ormond, no changes to Coumadin dosing for this week, lab only in 1 week.

## 2015-12-17 ENCOUNTER — Inpatient Hospital Stay (HOSPITAL_BASED_OUTPATIENT_CLINIC_OR_DEPARTMENT_OTHER): Payer: Medicare HMO | Admitting: Oncology

## 2015-12-17 ENCOUNTER — Inpatient Hospital Stay: Payer: Medicare HMO | Attending: Oncology

## 2015-12-17 ENCOUNTER — Telehealth: Payer: Self-pay | Admitting: *Deleted

## 2015-12-17 VITALS — BP 136/74 | HR 68 | Temp 98.2°F | Wt 184.2 lb

## 2015-12-17 DIAGNOSIS — N189 Chronic kidney disease, unspecified: Secondary | ICD-10-CM

## 2015-12-17 DIAGNOSIS — Z7901 Long term (current) use of anticoagulants: Secondary | ICD-10-CM | POA: Diagnosis not present

## 2015-12-17 DIAGNOSIS — D693 Immune thrombocytopenic purpura: Secondary | ICD-10-CM | POA: Insufficient documentation

## 2015-12-17 DIAGNOSIS — E785 Hyperlipidemia, unspecified: Secondary | ICD-10-CM | POA: Insufficient documentation

## 2015-12-17 DIAGNOSIS — Z87442 Personal history of urinary calculi: Secondary | ICD-10-CM | POA: Insufficient documentation

## 2015-12-17 DIAGNOSIS — Z86718 Personal history of other venous thrombosis and embolism: Secondary | ICD-10-CM | POA: Insufficient documentation

## 2015-12-17 DIAGNOSIS — I129 Hypertensive chronic kidney disease with stage 1 through stage 4 chronic kidney disease, or unspecified chronic kidney disease: Secondary | ICD-10-CM | POA: Diagnosis not present

## 2015-12-17 DIAGNOSIS — Z794 Long term (current) use of insulin: Secondary | ICD-10-CM

## 2015-12-17 DIAGNOSIS — Z79899 Other long term (current) drug therapy: Secondary | ICD-10-CM | POA: Insufficient documentation

## 2015-12-17 DIAGNOSIS — Z8614 Personal history of Methicillin resistant Staphylococcus aureus infection: Secondary | ICD-10-CM | POA: Diagnosis not present

## 2015-12-17 DIAGNOSIS — E119 Type 2 diabetes mellitus without complications: Secondary | ICD-10-CM

## 2015-12-17 DIAGNOSIS — D649 Anemia, unspecified: Secondary | ICD-10-CM

## 2015-12-17 DIAGNOSIS — Q602 Renal agenesis, unspecified: Secondary | ICD-10-CM | POA: Diagnosis not present

## 2015-12-17 DIAGNOSIS — Z87440 Personal history of urinary (tract) infections: Secondary | ICD-10-CM | POA: Insufficient documentation

## 2015-12-17 DIAGNOSIS — E039 Hypothyroidism, unspecified: Secondary | ICD-10-CM | POA: Diagnosis not present

## 2015-12-17 LAB — CBC WITH DIFFERENTIAL/PLATELET
BASOS ABS: 0.1 10*3/uL (ref 0–0.1)
BASOS PCT: 1 %
Eosinophils Absolute: 0.7 10*3/uL (ref 0–0.7)
Eosinophils Relative: 11 %
HEMATOCRIT: 31.9 % — AB (ref 40.0–52.0)
HEMOGLOBIN: 11.6 g/dL — AB (ref 13.0–18.0)
LYMPHS PCT: 40 %
Lymphs Abs: 2.3 10*3/uL (ref 1.0–3.6)
MCH: 32.9 pg (ref 26.0–34.0)
MCHC: 36.3 g/dL — AB (ref 32.0–36.0)
MCV: 90.6 fL (ref 80.0–100.0)
MONOS PCT: 9 %
Monocytes Absolute: 0.6 10*3/uL (ref 0.2–1.0)
NEUTROS ABS: 2.3 10*3/uL (ref 1.4–6.5)
NEUTROS PCT: 39 %
Platelets: 159 10*3/uL (ref 150–440)
RBC: 3.52 MIL/uL — ABNORMAL LOW (ref 4.40–5.90)
RDW: 15.9 % — ABNORMAL HIGH (ref 11.5–14.5)
WBC: 5.9 10*3/uL (ref 3.8–10.6)

## 2015-12-17 LAB — PROTIME-INR
INR: 1.96
PROTHROMBIN TIME: 22.2 s — AB (ref 11.4–15.0)

## 2015-12-17 NOTE — Progress Notes (Signed)
[place]  Telephone:(336) [phone] Fax:(336) [phone]  ID: Alejandro Armstrong OB: 1930/01/31  MR#: [id number]  JWT#:[id number]  Patient Care Team: Casilda Carls, MD as PCP - General (Internal Medicine)  CHIEF COMPLAINT:  No chief complaint on file.   INTERVAL HISTORY: Patient returns to clinic today for further evaluation and laboratory work. He currently feels well and is back to his baseline. He does not complain of weakness or fatigue today. He denies any easy bleeding or bruising. He denies any fevers. He has no neurologic complaints. He denies any chest pain or shortness of breath. He denies any nausea, vomiting, constipation, or diarrhea. He has no urinary complaints. Patient offers no further specific complaints.   REVIEW OF SYSTEMS:   Review of Systems  Constitutional: Negative for fever, weight loss and malaise/fatigue.  Respiratory: Negative.  Negative for cough and shortness of breath.   Cardiovascular: Negative.  Negative for chest pain.  Gastrointestinal: Negative.  Negative for blood in stool and melena.  Genitourinary: Negative.   Musculoskeletal: Negative.   Neurological: Negative.  Negative for headaches.  Endo/Heme/Allergies: Does not bruise/bleed easily.  Psychiatric/Behavioral: Negative.     As per HPI. Otherwise, a complete review of systems is negatve.  PAST MEDICAL HISTORY: Past Medical History  Diagnosis Date  . Hyperlipidemia   . Hypertension   . Hypothyroidism   . Type 2 diabetes mellitus ([place])   . Renal agenesis     discovered at age 47  . Recurrent UTI   . Ureteral stricture   . Kidney stones     PAST SURGICAL HISTORY: Past Surgical History  Procedure Laterality Date  . Kidney stone surgery      FAMILY HISTORY: No reported history of malignancy or chronic disease.      ADVANCED DIRECTIVES:    HEALTH MAINTENANCE: Social History  Substance Use Topics  . Smoking status: Never Smoker   . Smokeless tobacco:  Never Used  . Alcohol Use: No     Colonoscopy:  PAP:  Bone density:  Lipid panel:  No Known Allergies  Current Outpatient Prescriptions  Medication Sig Dispense Refill  . amLODipine (NORVASC) 5 MG tablet Take 1 tablet (5 mg total) by mouth daily. 30 tablet 0  . calcitRIOL (ROCALTROL) 0.25 MCG capsule Take 0.25 mcg by mouth daily.    . cholecalciferol (VITAMIN D) 1000 units tablet Take 1,000 Units by mouth daily.    [name] enoxaparin (LOVENOX) 40 MG/0.4ML injection Inject 0.8 mLs (80 mg total) into the skin daily. 8 Syringe 0  . feeding supplement, GLUCERNA SHAKE, (GLUCERNA SHAKE) LIQD Take 237 mLs by mouth 2 (two) times daily between meals. 30 Can 0  . ferrous sulfate 325 (65 FE) MG tablet Take 325 mg by mouth.    . insulin glargine (LANTUS) 100 UNIT/ML injection Inject 0.1 mLs (10 Units total) into the skin at bedtime. 10 mL 11  . levothyroxine (SYNTHROID, LEVOTHROID) 125 MCG tablet     . lisinopril (PRINIVIL,ZESTRIL) 20 MG tablet Take 20 mg by mouth.    . nystatin (MYCOSTATIN) 100000 UNIT/ML suspension Take 5 mLs (500,000 Units total) by mouth 4 (four) times daily. 60 mL 1  . Omega-3 1000 MG CAPS Take by mouth.    . polyethylene glycol (MIRALAX / GLYCOLAX) packet Take 17 g by mouth daily as needed for mild constipation. 14 each 0  . sodium bicarbonate 650 MG tablet     . warfarin (COUMADIN) 6 MG tablet Take 1 tablet (6 mg total) by mouth daily.  Or as directed 30 tablet 0   No current facility-administered medications for this visit.   Facility-Administered Medications Ordered in Other Visits  Medication Dose Route Frequency Provider Last Rate Last Dose  . heparin lock flush 100 unit/mL  500 Units Intravenous Once [name], MD      . sodium chloride flush (NS) 0.9 % injection 10 mL  10 mL Intravenous PRN [name], MD      . sodium chloride flush (NS) 0.9 % injection 10 mL  10 mL Intravenous PRN [name], MD   10 mL at 08/16/15 0857    OBJECTIVE: Filed  Vitals:   12/17/15 1445  BP: 136/74  Pulse: 68  Temp: 98.2 F (36.8 C)     Body mass index is 28.84 kg/(m^2).    ECOG FS:0 - Asymptomatic  General: Well-developed, well-nourished, no acute distress. Eyes: Pink conjunctiva, anicteric sclera. Lungs: Clear to auscultation bilaterally. Heart: Regular rate and rhythm. No rubs, murmurs, or gallops. Abdomen: Soft, nontender. No organomegaly noted, normoactive bowel sounds. Ventral hernia noted. Musculoskeletal: Bilateral lower extremity edema 1+ pitting, no cyanosis, or clubbing. Neuro: Alert, answering all questions appropriately. Cranial nerves grossly intact. Skin: Mild bruising noted. Psych: Normal affect.   LAB RESULTS:  Lab Results  Component Value Date   NA 137 09/19/2015   K 4.8 09/19/2015   CL 115* 09/19/2015   CO2 16* 09/19/2015   GLUCOSE 99 09/19/2015   BUN 32* 09/19/2015   CREATININE 3.06* 09/19/2015   CALCIUM 8.0* 09/19/2015   PROT 5.8* 09/18/2015   ALBUMIN 2.9* 09/18/2015   AST 23 09/18/2015   ALT 18 09/18/2015   ALKPHOS 105 09/18/2015   BILITOT 0.4 09/18/2015   GFRNONAA 17* 09/19/2015   GFRAA 20* 09/19/2015    Lab Results  Component Value Date   WBC 5.9 12/17/2015   NEUTROABS 2.3 12/17/2015   HGB 11.6* 12/17/2015   HCT 31.9* 12/17/2015   MCV 90.6 12/17/2015   PLT 159 12/17/2015     STUDIES: No results found.  ASSESSMENT:  Bone marrow biopsy confirmed ITP.  PLAN:    1. ITP: Bone marrow biopsy revealed normal megakaryocytes as well as normal trilineage hematopoiesis thus suggesting peripheral destruction of his platelets.  Infectious workup did not reveal an underlying viral etiology. Patient completed 4 weekly cycles of Rituxan on August 23, 2015.  His platelets continue to be within normal limits. No further intervention is needed at this time. Return to clinic in 3 months with repeat laboratory work and further evaluation.  2. Chronic renal insufficiency: Continue follow-up with nephrology as  scheduled. 3. DVT: Patient has now completed 3 months of treatment for his upper extremity DVT secondary to a PICC line. No further intervention is needed. He has been instructed to discontinue Coumadin.  4. Anemia: Mild, monitor. 6. MRSA bacteremia: Resolved.    Patient expressed understanding and was in agreement with this plan. He also understands that he can call clinic at any time with any questions, concerns, or complaints.   [name], MD   12/17/2015 9:54 PM

## 2015-12-17 NOTE — Telephone Encounter (Signed)
Patient requested a phone call this afternoon to tell him his platelet count. Platelets are WNL's at 159. Patient appreciated the call.

## 2016-03-18 ENCOUNTER — Other Ambulatory Visit: Payer: Medicare HMO

## 2016-03-18 ENCOUNTER — Ambulatory Visit: Payer: Medicare HMO | Admitting: Oncology

## 2016-03-19 IMAGING — CR DG CHEST 1V PORT
1 series · 1 of 1 positions shown · non-contrast
Comparison: 08/03/2015

CLINICAL DATA: Recent removal of the PICC line after developing
erythema at the site. Now with developing weakness.

EXAM:
PORTABLE CHEST 1 VIEW

[portable]
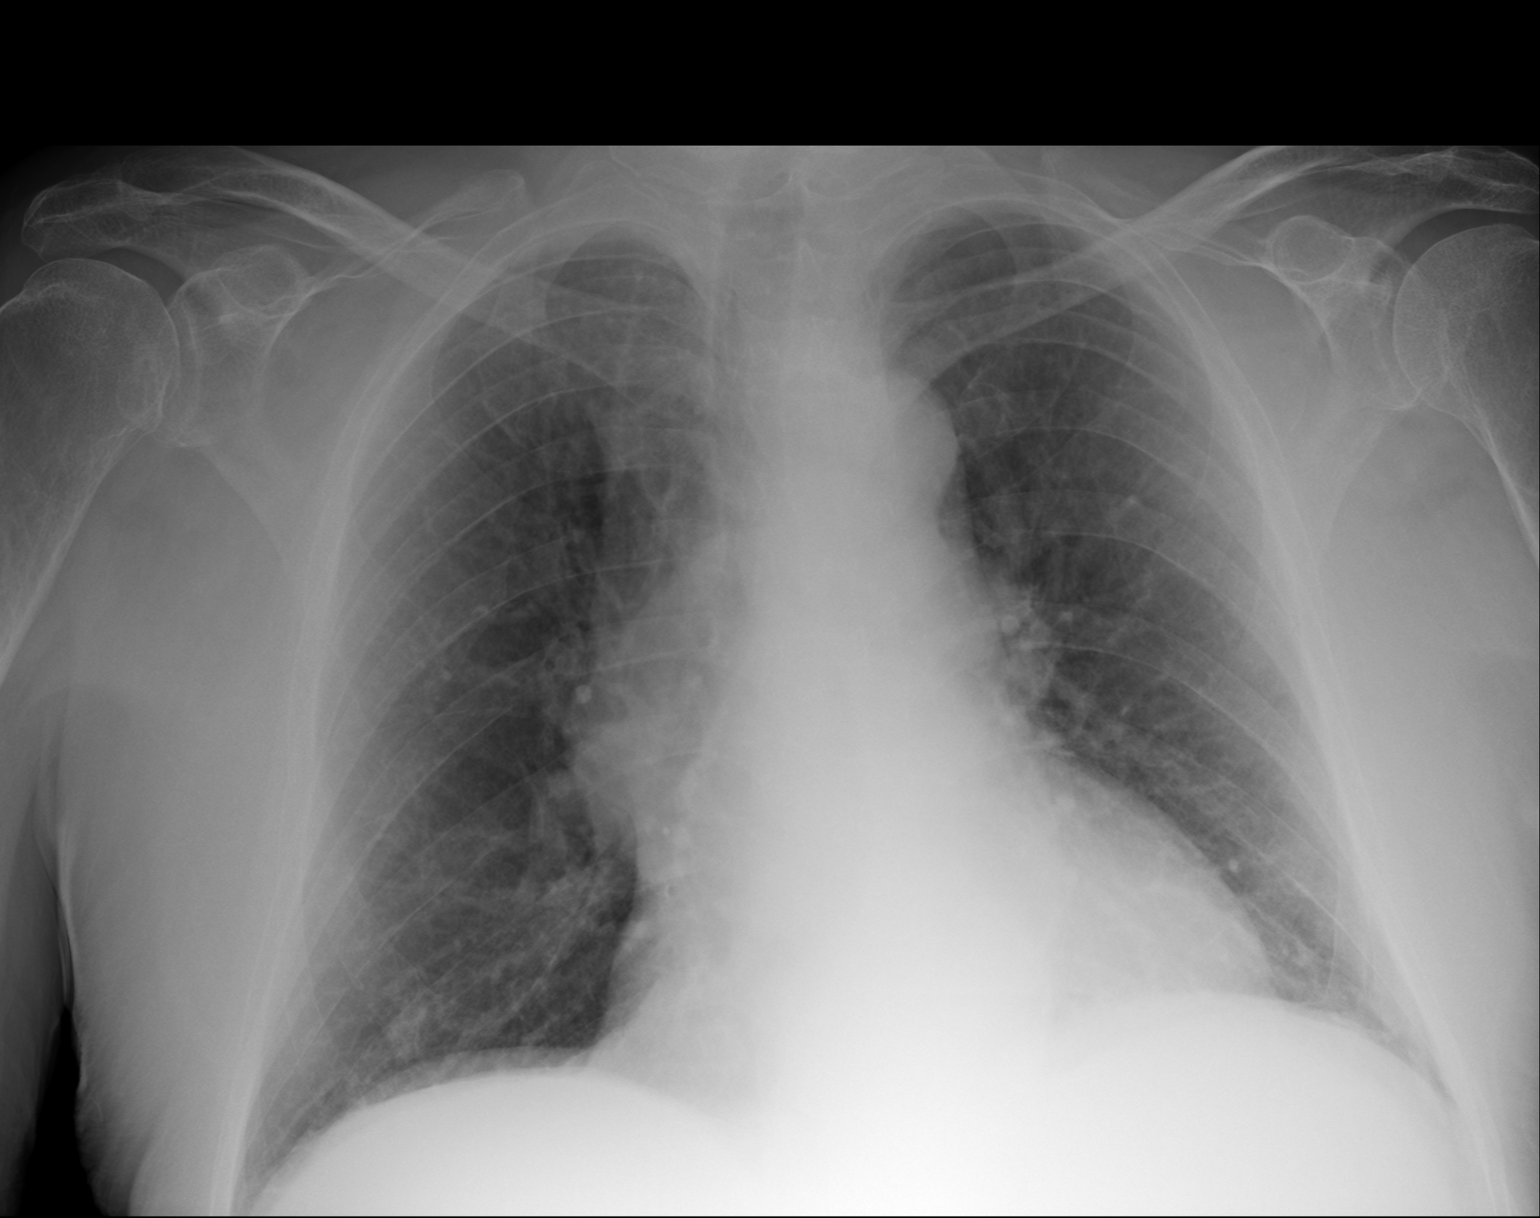

[1 of 1 positions shown; findings below may reference images not displayed]

FINDINGS: Mildly prominent right hilar contours are likely vascular,
accentuated due to the AP portable acquisition. The lungs are clear.
Pulmonary vasculature is normal. There is no large effusion. Heart
size is upper normal, unchanged.
IMPRESSION: No acute cardiopulmonary findings.

## 2016-03-22 IMAGING — DX DG CHEST 1V
1 series · 1 of 1 positions shown · non-contrast
Comparison: 09/01/2015

CLINICAL DATA: Status post PICC line placement

EXAM:
CHEST 1 VIEW

[chest ap]
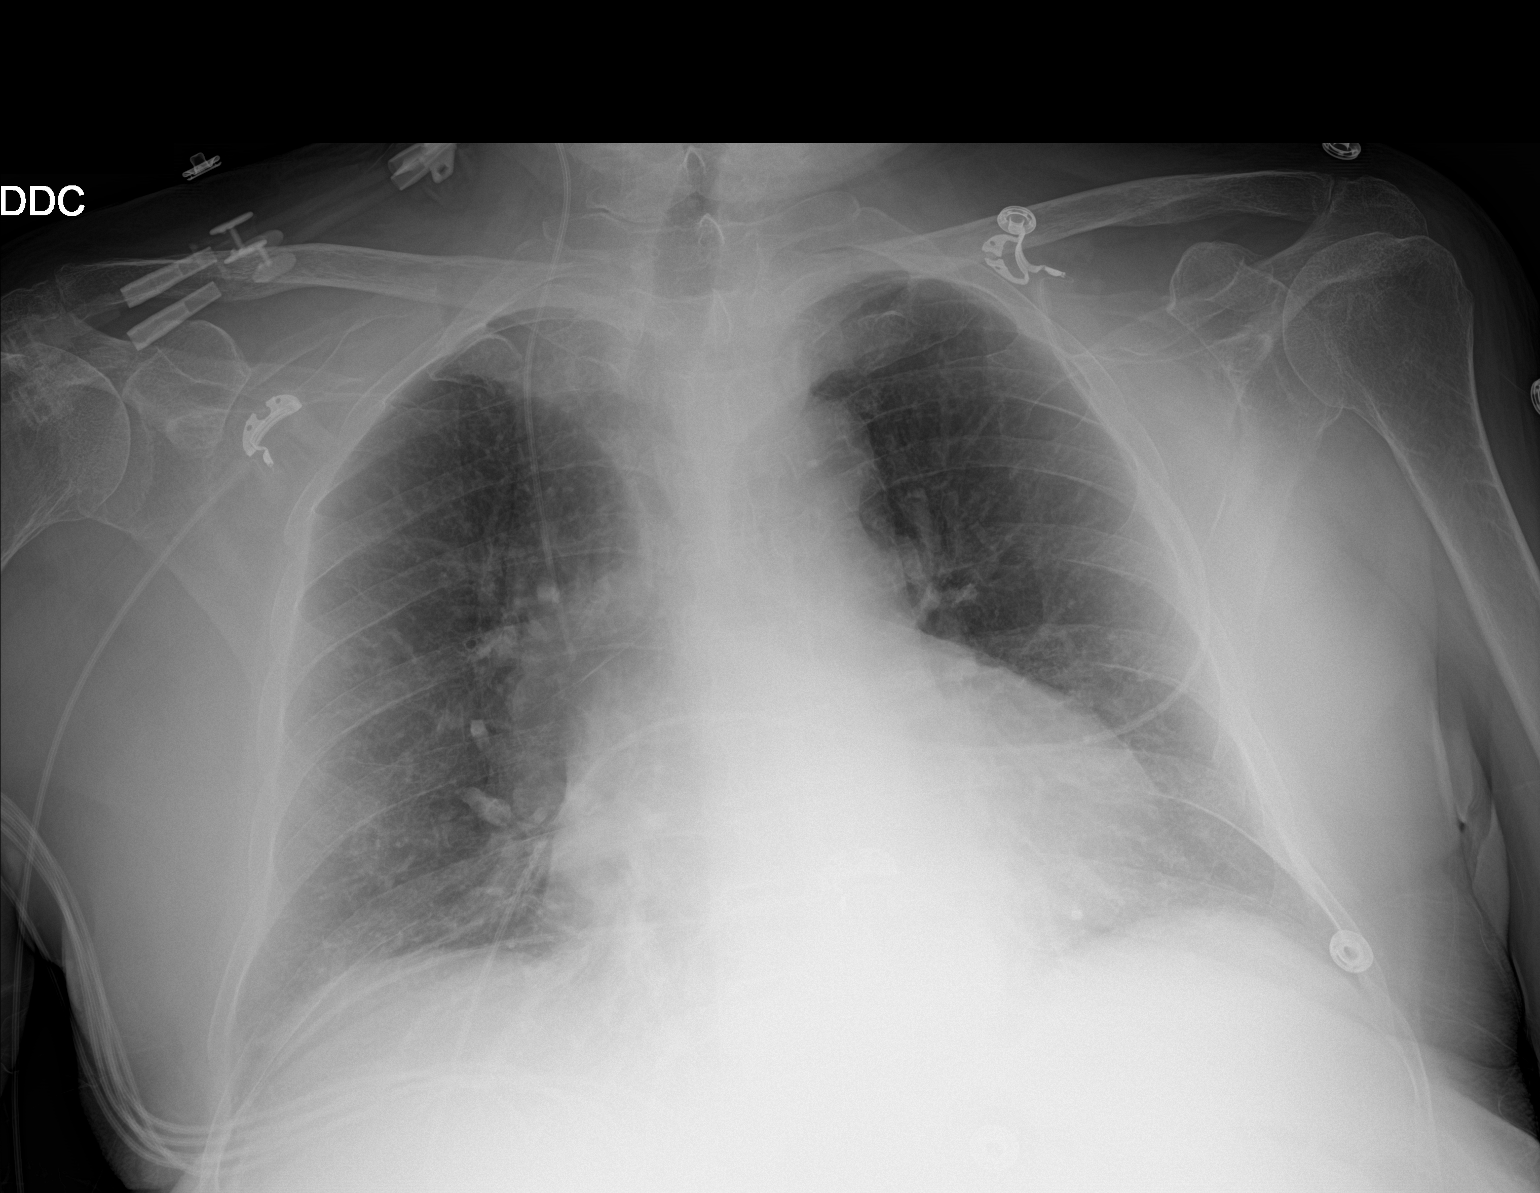

[1 of 1 positions shown; findings below may reference images not displayed]

FINDINGS: Cardiac shadow is stable. The lungs are well aerated bilaterally. A
new right jugular PICC line is noted with the catheter tip in the
distal superior vena cava. No pneumothorax is noted. No bony
abnormality is seen.
IMPRESSION: PICC line in satisfactory position. No other focal abnormality is
noted.

## 2016-03-23 NOTE — Progress Notes (Signed)
[place]  Telephone:(336) [phone] Fax:(336) [phone]  ID: [name] OB: [birth year]  MR#: [id number]  QXI#:[id number]  Patient Care Team: Casilda Carls, MD as PCP - General (Internal Medicine)  CHIEF COMPLAINT: Bone marrow biopsy confirmed ITP.  INTERVAL HISTORY: Patient returns to clinic today for further evaluation and laboratory work. He continues to feel well and is asymptomatic. He does not complain of weakness or fatigue today. He denies any easy bleeding or bruising. He denies any fevers. He has no neurologic complaints. He denies any chest pain or shortness of breath. He denies any nausea, vomiting, constipation, or diarrhea. He has no urinary complaints. Patient offers no further specific complaints today.   REVIEW OF SYSTEMS:   Review of Systems  Constitutional: Negative for fever, malaise/fatigue and weight loss.  Respiratory: Negative.  Negative for cough and shortness of breath.   Cardiovascular: Negative.  Negative for chest pain.  Gastrointestinal: Negative.  Negative for blood in stool and melena.  Genitourinary: Negative.   Musculoskeletal: Negative.   Neurological: Negative.  Negative for headaches.  Endo/Heme/Allergies: Does not bruise/bleed easily.  Psychiatric/Behavioral: Negative.     As per HPI. Otherwise, a complete review of systems is negative.  PAST MEDICAL HISTORY: Past Medical History:  Diagnosis Date  . Hyperlipidemia   . Hypertension   . Hypothyroidism   . Kidney stones   . Recurrent UTI   . Renal agenesis    discovered at age 25  . Type 2 diabetes mellitus ([place])   . Ureteral stricture     PAST SURGICAL HISTORY: Past Surgical History:  Procedure Laterality Date  . KIDNEY STONE SURGERY      FAMILY HISTORY: No reported history of malignancy or chronic disease.      ADVANCED DIRECTIVES:    HEALTH MAINTENANCE: Social History  Substance Use Topics  . Smoking status: Never Smoker  . Smokeless tobacco:  Never Used  . Alcohol use No     Colonoscopy:  PAP:  Bone density:  Lipid panel:  No Known Allergies  Current Outpatient Prescriptions  Medication Sig Dispense Refill  . amLODipine (NORVASC) 5 MG tablet Take 1 tablet (5 mg total) by mouth daily. 30 tablet 0  . calcitRIOL (ROCALTROL) 0.25 MCG capsule Take 0.25 mcg by mouth daily.    . cholecalciferol (VITAMIN D) 1000 units tablet Take 1,000 Units by mouth daily.    . ergocalciferol (VITAMIN D2) 50000 units capsule Take 50,000 Units by mouth once a week.    . ferrous sulfate 325 (65 FE) MG tablet Take 325 mg by mouth daily with breakfast.     . furosemide (LASIX) 40 MG tablet Take 40 mg by mouth daily.    . hydrALAZINE (APRESOLINE) 25 MG tablet Take 25 mg by mouth 2 (two) times daily.    . insulin glargine (LANTUS) 100 UNIT/ML injection Inject 0.1 mLs (10 Units total) into the skin at bedtime. (Patient taking differently: Inject 20 Units into the skin at bedtime. ) 10 mL 11  . levothyroxine (SYNTHROID, LEVOTHROID) 125 MCG tablet Take 125 mcg by mouth daily before breakfast.     . lisinopril (PRINIVIL,ZESTRIL) 20 MG tablet Take 20 mg by mouth.    . Omega-3 1000 MG CAPS Take 1 capsule by mouth daily.     . sodium bicarbonate 650 MG tablet Take 1,300 mg by mouth 3 (three) times daily.      No current facility-administered medications for this visit.    Facility-Administered Medications Ordered in Other Visits  Medication  Dose Route Frequency Provider Last Rate Last Dose  . heparin lock flush 100 unit/mL  500 Units Intravenous Once [name], MD      . sodium chloride flush (NS) 0.9 % injection 10 mL  10 mL Intravenous PRN [name], MD      . sodium chloride flush (NS) 0.9 % injection 10 mL  10 mL Intravenous PRN [name], MD   10 mL at 08/16/15 0857    OBJECTIVE: Vitals:   03/25/16 1016  BP: 134/71  Pulse: (!) 58  Resp: 18  Temp: 97.8 F (36.6 C)     Body mass index is 28.24 kg/m.    ECOG FS:0 -  Asymptomatic  General: Well-developed, well-nourished, no acute distress. Eyes: Pink conjunctiva, anicteric sclera. Lungs: Clear to auscultation bilaterally. Heart: Regular rate and rhythm. No rubs, murmurs, or gallops. Abdomen: Soft, nontender. No organomegaly noted, normoactive bowel sounds. Ventral hernia noted. Musculoskeletal: Bilateral lower extremity edema 1+ pitting, no cyanosis, or clubbing. Neuro: Alert, answering all questions appropriately. Cranial nerves grossly intact. Skin: Mild bruising noted. Psych: Normal affect.   LAB RESULTS:  Lab Results  Component Value Date   NA 137 09/19/2015   K 4.8 09/19/2015   CL 115 (H) 09/19/2015   CO2 16 (L) 09/19/2015   GLUCOSE 99 09/19/2015   BUN 32 (H) 09/19/2015   CREATININE 3.06 (H) 09/19/2015   CALCIUM 8.0 (L) 09/19/2015   PROT 5.8 (L) 09/18/2015   ALBUMIN 2.9 (L) 09/18/2015   AST 23 09/18/2015   ALT 18 09/18/2015   ALKPHOS 105 09/18/2015   BILITOT 0.4 09/18/2015   GFRNONAA 17 (L) 09/19/2015   GFRAA 20 (L) 09/19/2015    Lab Results  Component Value Date   WBC 5.6 03/25/2016   NEUTROABS 2.7 03/25/2016   HGB 10.6 (L) 03/25/2016   HCT 29.5 (L) 03/25/2016   MCV 93.9 03/25/2016   PLT 145 (L) 03/25/2016     STUDIES: No results found.  ASSESSMENT:  Bone marrow biopsy confirmed ITP.  PLAN:    1. ITP: Bone marrow biopsy revealed normal megakaryocytes as well as normal trilineage hematopoiesis thus suggesting peripheral destruction of his platelets.  Infectious workup did not reveal an underlying viral etiology. Patient completed 4 weekly cycles of Rituxan on August 23, 2015.  His platelets continue to be essentially within normal limits. No further intervention is needed at this time. Return to clinic in 3 months with repeat laboratory work and then in 6 months for repeat laboratory work and further evaluation.  2. Chronic renal insufficiency: Patient reports he likely will have to start dialysis in the near future. 3.  DVT: Patient has now completed 3 months of treatment for his upper extremity DVT secondary to a PICC line. No further intervention is needed. He has been instructed to discontinue Coumadin.  4. Anemia: Mild, monitor.   Patient expressed understanding and was in agreement with this plan. He also understands that he can call clinic at any time with any questions, concerns, or complaints.   [name], MD   03/30/2016 7:32 AM

## 2016-03-25 ENCOUNTER — Encounter (INDEPENDENT_AMBULATORY_CARE_PROVIDER_SITE_OTHER): Payer: Self-pay

## 2016-03-25 ENCOUNTER — Inpatient Hospital Stay (HOSPITAL_BASED_OUTPATIENT_CLINIC_OR_DEPARTMENT_OTHER): Payer: Medicare HMO | Admitting: Oncology

## 2016-03-25 ENCOUNTER — Inpatient Hospital Stay: Payer: Medicare HMO | Attending: Oncology

## 2016-03-25 ENCOUNTER — Other Ambulatory Visit (INDEPENDENT_AMBULATORY_CARE_PROVIDER_SITE_OTHER): Payer: Self-pay | Admitting: Vascular Surgery

## 2016-03-25 ENCOUNTER — Telehealth (INDEPENDENT_AMBULATORY_CARE_PROVIDER_SITE_OTHER): Payer: Self-pay

## 2016-03-25 VITALS — BP 134/71 | HR 58 | Temp 97.8°F | Resp 18 | Wt 180.3 lb

## 2016-03-25 DIAGNOSIS — D693 Immune thrombocytopenic purpura: Secondary | ICD-10-CM | POA: Diagnosis not present

## 2016-03-25 DIAGNOSIS — Q600 Renal agenesis, unilateral: Secondary | ICD-10-CM

## 2016-03-25 DIAGNOSIS — N2889 Other specified disorders of kidney and ureter: Secondary | ICD-10-CM

## 2016-03-25 DIAGNOSIS — Z79899 Other long term (current) drug therapy: Secondary | ICD-10-CM | POA: Diagnosis not present

## 2016-03-25 DIAGNOSIS — Z87440 Personal history of urinary (tract) infections: Secondary | ICD-10-CM

## 2016-03-25 DIAGNOSIS — N189 Chronic kidney disease, unspecified: Secondary | ICD-10-CM | POA: Diagnosis not present

## 2016-03-25 DIAGNOSIS — D649 Anemia, unspecified: Secondary | ICD-10-CM | POA: Insufficient documentation

## 2016-03-25 DIAGNOSIS — Z794 Long term (current) use of insulin: Secondary | ICD-10-CM

## 2016-03-25 DIAGNOSIS — E538 Deficiency of other specified B group vitamins: Secondary | ICD-10-CM

## 2016-03-25 DIAGNOSIS — E039 Hypothyroidism, unspecified: Secondary | ICD-10-CM | POA: Diagnosis not present

## 2016-03-25 DIAGNOSIS — Z86718 Personal history of other venous thrombosis and embolism: Secondary | ICD-10-CM | POA: Diagnosis not present

## 2016-03-25 DIAGNOSIS — D696 Thrombocytopenia, unspecified: Secondary | ICD-10-CM

## 2016-03-25 DIAGNOSIS — E785 Hyperlipidemia, unspecified: Secondary | ICD-10-CM | POA: Insufficient documentation

## 2016-03-25 DIAGNOSIS — Z87442 Personal history of urinary calculi: Secondary | ICD-10-CM | POA: Insufficient documentation

## 2016-03-25 DIAGNOSIS — I129 Hypertensive chronic kidney disease with stage 1 through stage 4 chronic kidney disease, or unspecified chronic kidney disease: Secondary | ICD-10-CM | POA: Diagnosis not present

## 2016-03-25 DIAGNOSIS — E119 Type 2 diabetes mellitus without complications: Secondary | ICD-10-CM

## 2016-03-25 LAB — CBC WITH DIFFERENTIAL/PLATELET
BASOS ABS: 0.1 10*3/uL (ref 0–0.1)
BASOS PCT: 1 %
Eosinophils Absolute: 0.5 10*3/uL (ref 0–0.7)
Eosinophils Relative: 9 %
HEMATOCRIT: 29.5 % — AB (ref 40.0–52.0)
HEMOGLOBIN: 10.6 g/dL — AB (ref 13.0–18.0)
LYMPHS PCT: 34 %
Lymphs Abs: 1.9 10*3/uL (ref 1.0–3.6)
MCH: 33.7 pg (ref 26.0–34.0)
MCHC: 35.9 g/dL (ref 32.0–36.0)
MCV: 93.9 fL (ref 80.0–100.0)
Monocytes Absolute: 0.5 10*3/uL (ref 0.2–1.0)
Monocytes Relative: 9 %
NEUTROS ABS: 2.7 10*3/uL (ref 1.4–6.5)
NEUTROS PCT: 47 %
Platelets: 145 10*3/uL — ABNORMAL LOW (ref 150–440)
RBC: 3.14 MIL/uL — ABNORMAL LOW (ref 4.40–5.90)
RDW: 12.9 % (ref 11.5–14.5)
WBC: 5.6 10*3/uL (ref 3.8–10.6)

## 2016-03-25 NOTE — Telephone Encounter (Signed)
Spoke with the patient's wife and let her know that he had been moved from 04/11/16 to 04/16/16 and his pre-op day and time as well - 04/02/16 @10 :30.

## 2016-03-25 NOTE — Progress Notes (Signed)
States is feeling well. Has fatigue in the morning.

## 2016-04-02 ENCOUNTER — Encounter
Admission: RE | Admit: 2016-04-02 | Discharge: 2016-04-02 | Disposition: A | Discharge status: Home / Self Care | Payer: Medicare HMO | Source: Ambulatory Visit | Attending: Vascular Surgery | Admitting: Vascular Surgery

## 2016-04-02 DIAGNOSIS — I10 Essential (primary) hypertension: Secondary | ICD-10-CM | POA: Insufficient documentation

## 2016-04-02 DIAGNOSIS — Z01812 Encounter for preprocedural laboratory examination: Secondary | ICD-10-CM | POA: Diagnosis not present

## 2016-04-02 DIAGNOSIS — Z01810 Encounter for preprocedural cardiovascular examination: Secondary | ICD-10-CM | POA: Insufficient documentation

## 2016-04-02 DIAGNOSIS — E119 Type 2 diabetes mellitus without complications: Secondary | ICD-10-CM | POA: Insufficient documentation

## 2016-04-02 LAB — BASIC METABOLIC PANEL
Anion gap: 9 (ref 5–15)
BUN: 56 mg/dL — AB (ref 6–20)
CALCIUM: 9.5 mg/dL (ref 8.9–10.3)
CO2: 22 mmol/L (ref 22–32)
CREATININE: 4.59 mg/dL — AB (ref 0.61–1.24)
Chloride: 110 mmol/L (ref 101–111)
GFR calc non Af Amer: 11 mL/min — ABNORMAL LOW (ref >60)
GFR, EST AFRICAN AMERICAN: 12 mL/min — AB (ref >60)
GLUCOSE: 137 mg/dL — AB (ref 65–99)
Potassium: 4.5 mmol/L (ref 3.5–5.1)
Sodium: 141 mmol/L (ref 135–145)

## 2016-04-02 LAB — PROTIME-INR
INR: 0.94
PROTHROMBIN TIME: 12.6 s (ref 11.4–15.2)

## 2016-04-02 LAB — CBC WITH DIFFERENTIAL/PLATELET
BASOS PCT: 1 %
Basophils Absolute: 0 10*3/uL (ref 0–0.1)
EOS ABS: 0.6 10*3/uL (ref 0–0.7)
EOS PCT: 9 %
HCT: 30.6 % — ABNORMAL LOW (ref 40.0–52.0)
Hemoglobin: 11.1 g/dL — ABNORMAL LOW (ref 13.0–18.0)
Lymphocytes Relative: 30 %
Lymphs Abs: 2.1 10*3/uL (ref 1.0–3.6)
MCH: 34 pg (ref 26.0–34.0)
MCHC: 36.3 g/dL — AB (ref 32.0–36.0)
MCV: 93.7 fL (ref 80.0–100.0)
MONO ABS: 0.6 10*3/uL (ref 0.2–1.0)
MONOS PCT: 8 %
NEUTROS PCT: 52 %
Neutro Abs: 3.6 10*3/uL (ref 1.4–6.5)
PLATELETS: 180 10*3/uL (ref 150–440)
RBC: 3.27 MIL/uL — ABNORMAL LOW (ref 4.40–5.90)
RDW: 13.3 % (ref 11.5–14.5)
WBC: 6.9 10*3/uL (ref 3.8–10.6)

## 2016-04-02 LAB — SURGICAL PCR SCREEN
MRSA, PCR: NEGATIVE
Staphylococcus aureus: NEGATIVE

## 2016-04-02 LAB — TYPE AND SCREEN
ABO/RH(D): O POS
Antibody Screen: NEGATIVE

## 2016-04-02 LAB — APTT: aPTT: 30 seconds (ref 24–36)

## 2016-04-02 NOTE — Patient Instructions (Signed)
  Your procedure is scheduled on:Wednesday Nov. 1 , 2017. Report to Same Day Surgery. To find out your arrival time please call [phone] between 1PM - 3PM on Tuesday Oct. 31, 2017.  Remember: Instructions that are not followed completely may result in serious medical risk, up to and including death, or upon the discretion of your surgeon and anesthesiologist your surgery may need to be rescheduled.    _x___ 1. Do not eat food or drink liquids after midnight. No gum chewing or  hard candies.     ____ 2. No Alcohol for 24 hours before or after surgery.   ____ 3. Bring all medications with you on the day of surgery if instructed.    __x__ 4. Notify your doctor if there is any change in your medical condition     (cold, fever, infections).    _____ 5. No smoking 24 hours prior to surgery.     Do not wear jewelry, make-up, hairpins, clips or nail polish.  Do not wear lotions, powders, or perfumes.   Do not shave 48 hours prior to surgery. Men may shave face and neck.  Do not bring valuables to the hospital.    [facility] is not responsible for any belongings or valuables.               Contacts, dentures or bridgework may not be worn into surgery.  Leave your suitcase in the car. After surgery it may be brought to your room.  For patients admitted to the hospital, discharge time is determined by your treatment team.   Patients discharged the day of surgery will not be allowed to drive home.    Please read over the following fact sheets that you were given:   [facility] Preparing for Surgery  __x__ Take these medicines the morning of surgery with A SIP OF WATER:    1. amLODipine (NORVASC)   2. hydrALAZINE (APRESOLINE)   3. levothyroxine (SYNTHROID, LEVOTHROID)   4. lisinopril (PRINIVIL,ZESTRIL)    ____ Fleet Enema (as directed)   __x__ Use CHG Soap as directed on instruction sheet  ____ Use inhalers on the day of surgery and bring to hospital day of surgery  ____ Stop  metformin 2 days prior to surgery    __x__ Take 1/2 of usual insulin dose the night before surgery and none on the morning of surgery.   ____ Stop Coumadin/Plavix/aspirin on does not apply.  ____ Stop Anti-inflammatories such as Advil, Aleve, Ibuprofen, Motrin, Naproxen,  Naprosyn, Goodies powders or aspirin products. OK to take Tylenol.   _x___ Stop supplements: fish oil and Omega-3  until after surgery.    ____ Bring C-Pap to the hospital.

## 2016-04-02 NOTE — Pre-Procedure Instructions (Signed)
Spoke to Mickel Baas RN at Dr. Nino Parsley office regarding H+P, she is trying to get him an appt for the H+P prior to surgery on 04/16/2016.

## 2016-04-03 ENCOUNTER — Ambulatory Visit (INDEPENDENT_AMBULATORY_CARE_PROVIDER_SITE_OTHER): Payer: Medicare HMO | Admitting: Vascular Surgery

## 2016-04-03 ENCOUNTER — Encounter (INDEPENDENT_AMBULATORY_CARE_PROVIDER_SITE_OTHER): Payer: Self-pay | Admitting: Vascular Surgery

## 2016-04-03 VITALS — BP 135/70 | HR 56 | Resp 16 | Ht 67.5 in | Wt 181.0 lb

## 2016-04-03 DIAGNOSIS — D693 Immune thrombocytopenic purpura: Secondary | ICD-10-CM | POA: Diagnosis not present

## 2016-04-03 DIAGNOSIS — N185 Chronic kidney disease, stage 5: Secondary | ICD-10-CM

## 2016-04-03 DIAGNOSIS — E1122 Type 2 diabetes mellitus with diabetic chronic kidney disease: Secondary | ICD-10-CM | POA: Diagnosis not present

## 2016-04-03 DIAGNOSIS — N289 Disorder of kidney and ureter, unspecified: Secondary | ICD-10-CM | POA: Diagnosis not present

## 2016-04-03 DIAGNOSIS — N189 Chronic kidney disease, unspecified: Secondary | ICD-10-CM | POA: Diagnosis not present

## 2016-04-05 IMAGING — CR DG CHEST 2V
2 series · 2 of 2 positions shown · non-contrast
Comparison: 09/04/2015

CLINICAL DATA: Fever and cough.  Fell today.

EXAM:
CHEST  2 VIEW

[chest pa]
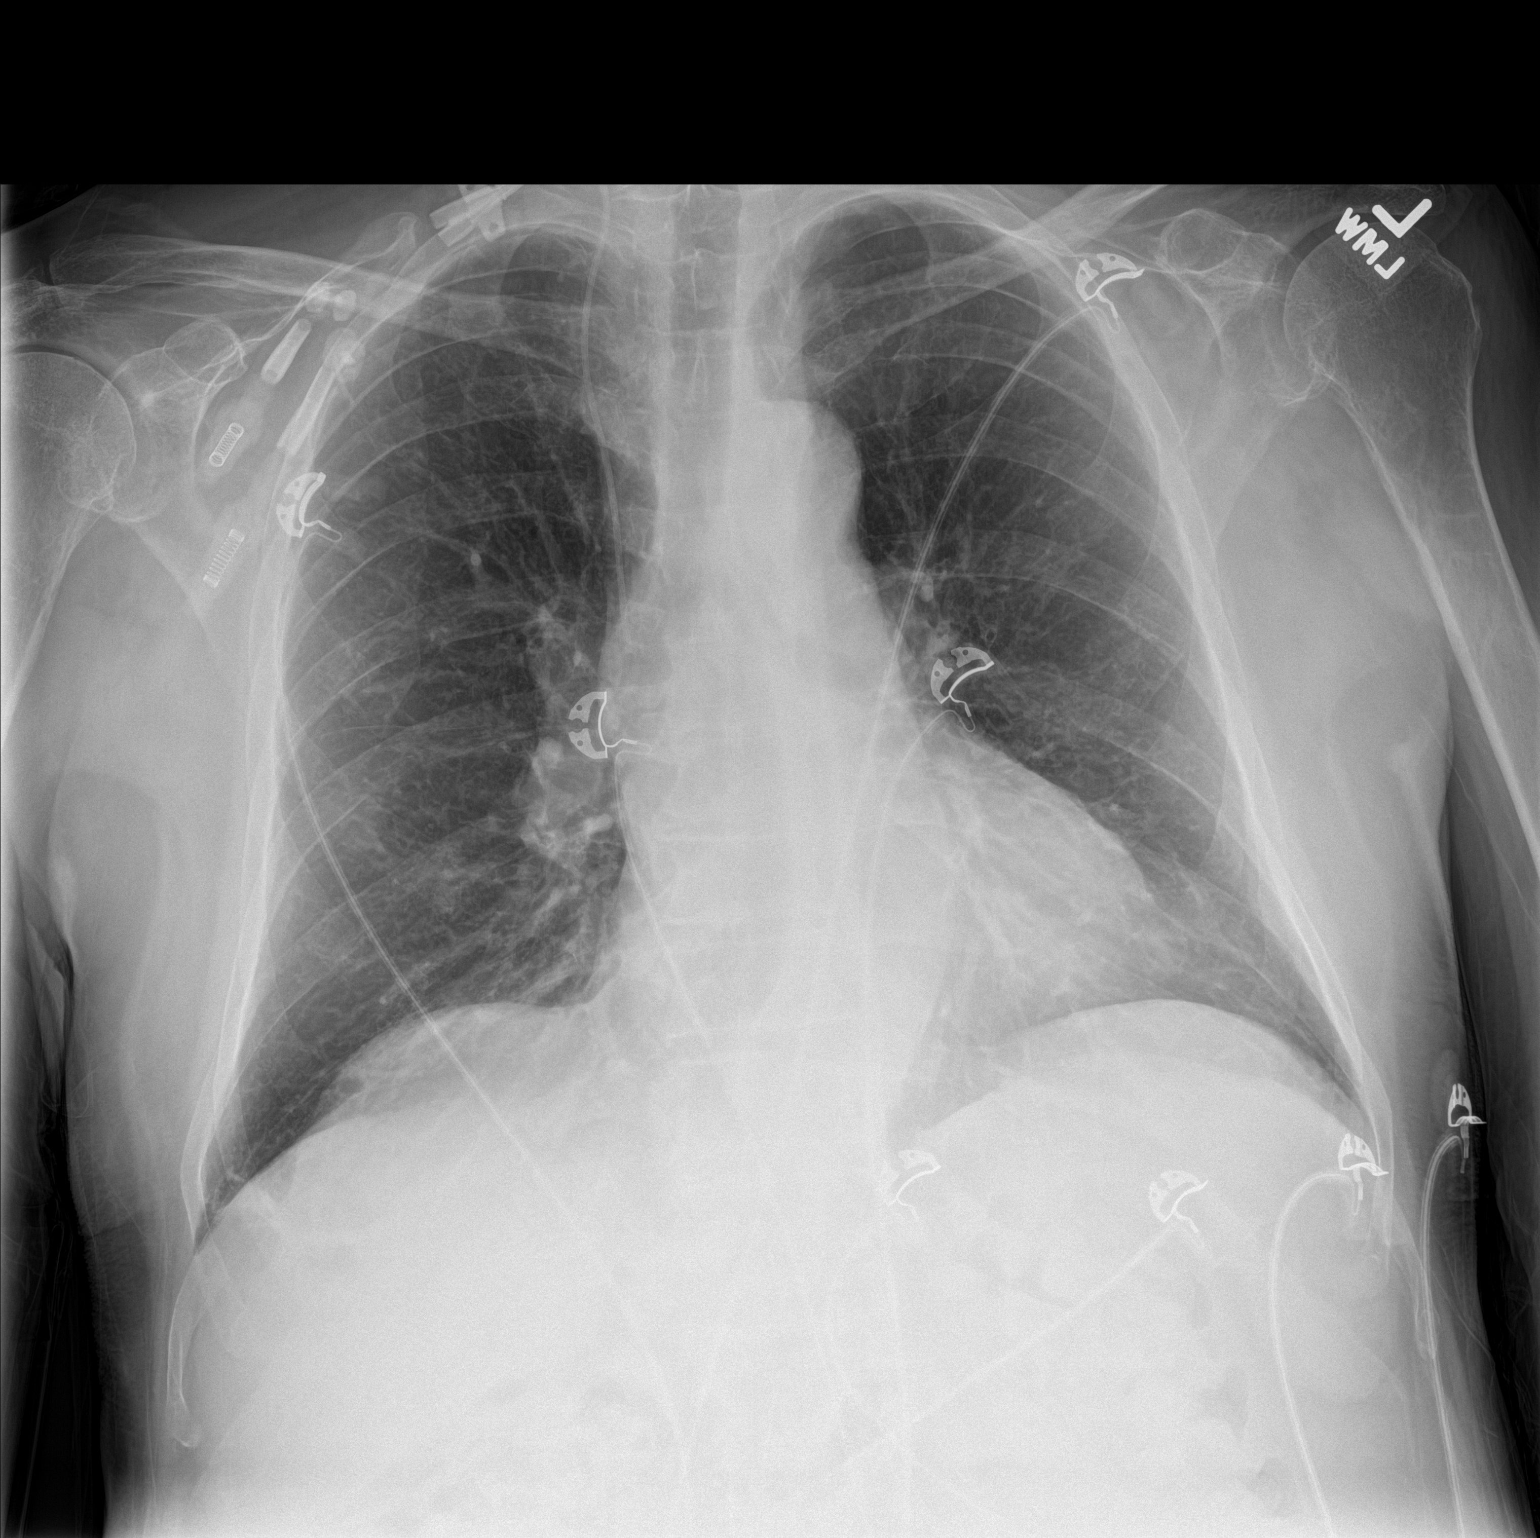

[chest lat]
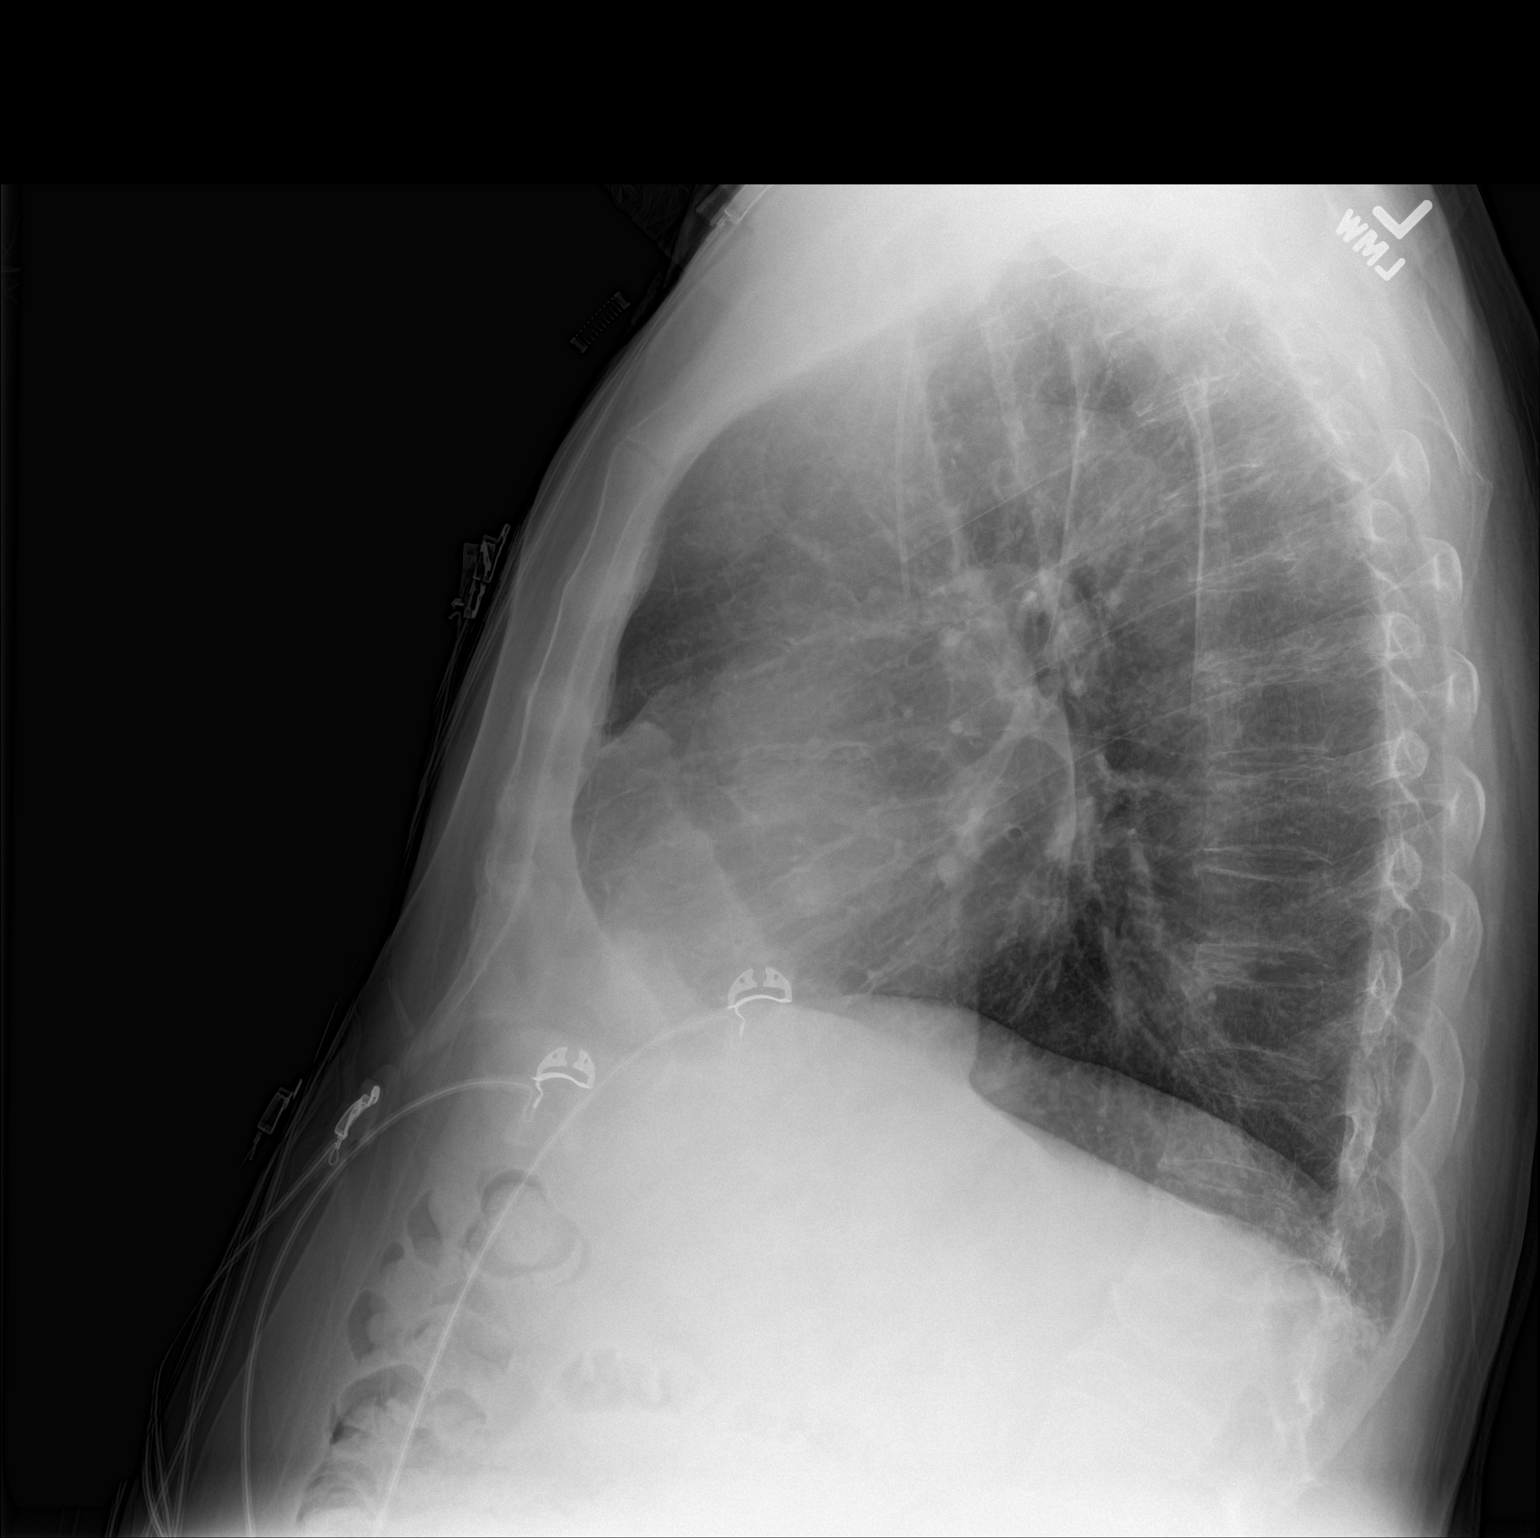

[2 of 2 positions shown; findings below may reference images not displayed]

FINDINGS: There is a right jugular central line with tip in the SVC near the
azygos vein junction. The lungs are clear, mildly hyperinflated.
Pulmonary vasculature is normal. There is no pleural effusion.
Hilar, mediastinal and cardiac contours are unremarkable and
unchanged.
IMPRESSION: Mild hyperinflation.  No acute findings.

## 2016-04-06 NOTE — Progress Notes (Signed)
[facility] Admission History & Physical  MRN : [id number]  Alejandro Armstrong is a 82 y.o. (May 30, 1930) male who presents with chief complaint of  Chief Complaint  Patient presents with  . Re-evaluation    Update H and P  .  History of Present Illness:  The patient is seen for evaluation of dialysis access creation.  The patient has a history of of chronic renal insufficiency that has now worsened and nephrology is saying he must start dialysis.    Currently he does not have an access.  He denies hand or arm pain.  The patient denies amaurosis fugax or recent TIA symptoms. There are no recent neurological changes noted. The patient denies claudication symptoms or rest pain symptoms. The patient denies history of DVT, PE or superficial thrombophlebitis. The patient denies recent episodes of angina or shortness of breath.   Current Meds  Medication Sig  . amLODipine (NORVASC) 5 MG tablet Take 1 tablet (5 mg total) by mouth daily. (Patient taking differently: Take 5 mg by mouth every morning. )  . calcitRIOL (ROCALTROL) 0.25 MCG capsule Take 0.25 mcg by mouth daily.  . cholecalciferol (VITAMIN D) 1000 units tablet Take 1,000 Units by mouth daily.  . ergocalciferol (VITAMIN D2) 50000 units capsule Take 50,000 Units by mouth once a week.  . ferrous sulfate 325 (65 FE) MG tablet Take 325 mg by mouth daily with breakfast.   . furosemide (LASIX) 40 MG tablet Take 40 mg by mouth every morning.   . hydrALAZINE (APRESOLINE) 25 MG tablet Take 25 mg by mouth 2 (two) times daily.  . insulin glargine (LANTUS) 100 UNIT/ML injection Inject 0.1 mLs (10 Units total) into the skin at bedtime. (Patient taking differently: Inject 20 Units into the skin every morning. )  . levothyroxine (SYNTHROID, LEVOTHROID) 125 MCG tablet Take 125 mcg by mouth daily before breakfast.   . lisinopril (PRINIVIL,ZESTRIL) 20 MG tablet Take 20 mg by mouth every morning.   . Omega-3 1000 MG CAPS Take  1 capsule by mouth daily.   . sodium bicarbonate 650 MG tablet Take 1,300 mg by mouth 3 (three) times daily.     Past Medical History:  Diagnosis Date  . Hyperlipidemia   . Hypertension   . Hypothyroidism   . Kidney stones   . Recurrent UTI   . Renal agenesis    discovered at age 24  . Type 2 diabetes mellitus ([place])   . Ureteral stricture     Past Surgical History:  Procedure Laterality Date  . APPENDECTOMY    . CATARACT EXTRACTION W/ INTRAOCULAR LENS IMPLANT Bilateral 2014   done 1-2 months apart at [facility].  [name] FINGER SURGERY Left 2002   pinky finger  . KIDNEY STONE SURGERY    . NASAL SINUS SURGERY  1985  . PICC LINE PLACE PERIPHERAL ([place] HX) Right 08/2015  . PICC LINE REMOVAL ([place] HX) Right 09/2015    Social History Social History  Substance Use Topics  . Smoking status: Never Smoker  . Smokeless tobacco: Never Used  . Alcohol use No    Family History Family History  Problem Relation Age of Onset  . Cervical cancer Sister   No family history of bleeding/clotting disorders, porphyria or autoimmune disease   No Known Allergies   REVIEW OF SYSTEMS (Negative unless checked)  Constitutional: [] Weight loss  [] Fever  [] Chills Cardiac: [] Chest pain   [] Chest pressure   [] Palpitations   [] Shortness of breath when laying flat   []   Shortness of breath with exertion. Vascular:  [] Pain in legs with walking   [] Pain in legs at rest  [] History of DVT   [] Phlebitis   [] Swelling in legs   [] Varicose veins   [] Non-healing ulcers Pulmonary:   [] Uses home oxygen   [] Productive cough   [] Hemoptysis   [] Wheeze  [] COPD   [] Asthma Neurologic:  [] Dizziness   [] Seizures   [] History of stroke   [] History of TIA  [] Aphasia   [] Vissual changes   [] Weakness or numbness in arm   [] Weakness or numbness in leg Musculoskeletal:   [] Joint swelling   [] Joint pain   [] Low back pain Hematologic:  [] Easy bruising  [] Easy bleeding   [] Hypercoagulable state   [] Anemic Gastrointestinal:  [] Diarrhea    [] Vomiting  [] Gastroesophageal reflux/heartburn   [] Difficulty swallowing. Genitourinary:  [] Chronic kidney disease   [] Difficult urination  [] Frequent urination   [] Blood in urine Skin:  [] Rashes   [] Ulcers  Psychological:  [] History of anxiety   []  History of major depression.  Physical Examination  Vitals:   04/03/16 1533  BP: 135/70  Pulse: (!) 56  Resp: 16  Weight: 181 lb (82.1 kg)  Height: 5' 7.5" (1.715 m)   Body mass index is 27.93 kg/m. Gen: WD/WN, NAD Head: [place]/AT, No temporalis wasting.  Ear/Nose/Throat: Hearing grossly intact, nares w/o erythema or drainage, poor dentition Eyes: PER, EOMI, sclera nonicteric.  Neck: Supple, no masses.  No bruit or JVD.  Pulmonary:  Good air movement, clear to auscultation bilaterally, no use of accessory muscles.  Cardiac: RRR, normal S1, S2, no Murmurs. Vascular:  Vessel Right Left  Radial Palpable Palpable  Ulnar Palpable Palpable  Brachial Palpable Palpable  Carotid Palpable Palpable  Femoral Palpable Palpable  Popliteal Palpable Palpable  PT Palpable Palpable  DP Palpable Palpable   Gastrointestinal: soft, non-distended. No guarding/no peritoneal signs.  Musculoskeletal: M/S 5/5 throughout.  No deformity or atrophy.  Neurologic: CN 2-12 intact. Pain and light touch intact in extremities.  Symmetrical.  Speech is fluent. Motor exam as listed above. Psychiatric: Judgment intact, Mood & affect appropriate for pt's clinical situation. Dermatologic: No rashes or ulcers noted.  No changes consistent with cellulitis. Lymph : No Cervical lymphadenopathy, no lichenification or skin changes of chronic lymphedema.  CBC Lab Results  Component Value Date   WBC 6.9 04/02/2016   HGB 11.1 (L) 04/02/2016   HCT 30.6 (L) 04/02/2016   MCV 93.7 04/02/2016   PLT 180 04/02/2016    BMET    Component Value Date/Time   NA 141 04/02/2016 1131   NA 137 10/15/2012 2113   K 4.5 04/02/2016 1131   K 4.2 10/15/2012 2113   CL 110 04/02/2016  1131   CL 110 (H) 10/15/2012 2113   CO2 22 04/02/2016 1131   CO2 16 (L) 10/15/2012 2113   GLUCOSE 137 (H) 04/02/2016 1131   GLUCOSE 142 (H) 10/15/2012 2113   BUN 56 (H) 04/02/2016 1131   BUN 51 (H) 10/15/2012 2113   CREATININE 4.59 (H) 04/02/2016 1131   CREATININE 3.24 (H) 10/15/2012 2113   CALCIUM 9.5 04/02/2016 1131   CALCIUM 8.7 10/15/2012 2113   GFRNONAA 11 (L) 04/02/2016 1131   GFRNONAA 17 (L) 10/15/2012 2113   GFRAA 12 (L) 04/02/2016 1131   GFRAA 20 (L) 10/15/2012 2113   Estimated Creatinine Clearance: 12.4 mL/min (by C-G formula based on SCr of 4.59 mg/dL (H)).  COAG Lab Results  Component Value Date   INR 0.94 04/02/2016   INR 1.96 12/17/2015  INR 2.43 12/10/2015    Radiology No results found.  Assessment/Plan 1. Acute on chronic renal insufficiency Recommend:  At this time the patient does not have appropriate extremity access for dialysis  Patient should have a brachial axillary graft created.  The risks, benefits and alternative therapies were reviewed in detail with the patient.  All questions were answered.  The patient agrees to proceed with surgery.   2. Type 2 diabetes mellitus with stage 5 chronic kidney disease not on chronic dialysis, without long-term current use of insulin (HCC) Continue oral hypoglycemic medications  3. Idiopathic thrombocytopenic purpura ([place]) Avoid antiplatelet drugs Will need platelet count before surgery    Hortencia Pilar, MD  04/06/2016 11:47 AM

## 2016-04-16 ENCOUNTER — Ambulatory Visit: Payer: Medicare HMO | Admitting: Anesthesiology

## 2016-04-16 ENCOUNTER — Encounter: Payer: Self-pay | Admitting: *Deleted

## 2016-04-16 ENCOUNTER — Ambulatory Visit
Admission: RE | Admit: 2016-04-16 | Discharge: 2016-04-16 | Disposition: A | Discharge status: Home / Self Care | Payer: Medicare HMO | Source: Ambulatory Visit | Attending: Vascular Surgery | Admitting: Vascular Surgery

## 2016-04-16 ENCOUNTER — Encounter
Admission: RE | Disposition: A | Discharge status: Home / Self Care | Payer: Self-pay | Source: Ambulatory Visit | Attending: Vascular Surgery

## 2016-04-16 DIAGNOSIS — Z961 Presence of intraocular lens: Secondary | ICD-10-CM | POA: Insufficient documentation

## 2016-04-16 DIAGNOSIS — E1122 Type 2 diabetes mellitus with diabetic chronic kidney disease: Secondary | ICD-10-CM | POA: Diagnosis not present

## 2016-04-16 DIAGNOSIS — Z9841 Cataract extraction status, right eye: Secondary | ICD-10-CM | POA: Insufficient documentation

## 2016-04-16 DIAGNOSIS — N289 Disorder of kidney and ureter, unspecified: Secondary | ICD-10-CM | POA: Insufficient documentation

## 2016-04-16 DIAGNOSIS — Z9842 Cataract extraction status, left eye: Secondary | ICD-10-CM | POA: Diagnosis not present

## 2016-04-16 DIAGNOSIS — N186 End stage renal disease: Secondary | ICD-10-CM | POA: Diagnosis not present

## 2016-04-16 DIAGNOSIS — E785 Hyperlipidemia, unspecified: Secondary | ICD-10-CM | POA: Diagnosis not present

## 2016-04-16 DIAGNOSIS — D693 Immune thrombocytopenic purpura: Secondary | ICD-10-CM | POA: Diagnosis not present

## 2016-04-16 DIAGNOSIS — E039 Hypothyroidism, unspecified: Secondary | ICD-10-CM | POA: Diagnosis not present

## 2016-04-16 DIAGNOSIS — Z87440 Personal history of urinary (tract) infections: Secondary | ICD-10-CM | POA: Diagnosis not present

## 2016-04-16 DIAGNOSIS — Z794 Long term (current) use of insulin: Secondary | ICD-10-CM | POA: Diagnosis not present

## 2016-04-16 DIAGNOSIS — I120 Hypertensive chronic kidney disease with stage 5 chronic kidney disease or end stage renal disease: Secondary | ICD-10-CM | POA: Insufficient documentation

## 2016-04-16 HISTORY — DX: Immune thrombocytopenic purpura: D69.3

## 2016-04-16 HISTORY — PX: AV FISTULA PLACEMENT: SHX1204

## 2016-04-16 LAB — GLUCOSE, CAPILLARY
GLUCOSE-CAPILLARY: 127 mg/dL — AB (ref 65–99)
Glucose-Capillary: 111 mg/dL — ABNORMAL HIGH (ref 65–99)

## 2016-04-16 LAB — PLATELET COUNT: Platelets: 147 10*3/uL — ABNORMAL LOW (ref 150–440)

## 2016-04-16 SURGERY — INSERTION OF ARTERIOVENOUS (AV) GORE-TEX GRAFT ARM
Anesthesia: General | Site: Arm Upper | Laterality: Left | Wound class: Clean

## 2016-04-16 MED ORDER — FENTANYL CITRATE (PF) 100 MCG/2ML IJ SOLN
INTRAMUSCULAR | Status: AC
  Administered 2016-04-16: 25 ug via INTRAVENOUS
  Filled 2016-04-16: qty 2

## 2016-04-16 MED ORDER — HYDROCODONE-ACETAMINOPHEN 5-325 MG PO TABS: 1.0000 | ORAL_TABLET | Freq: Four times a day (QID) | ORAL | 0 refills | Status: DC | PRN

## 2016-04-16 MED ORDER — BUPIVACAINE HCL (PF) 0.5 % IJ SOLN
INTRAMUSCULAR | Status: AC
  Filled 2016-04-16: qty 30

## 2016-04-16 MED ORDER — FAMOTIDINE 20 MG PO TABS
ORAL_TABLET | ORAL | Status: AC
  Filled 2016-04-16: qty 1

## 2016-04-16 MED ORDER — PAPAVERINE HCL 30 MG/ML IJ SOLN
INTRAMUSCULAR | Status: AC
  Filled 2016-04-16: qty 2

## 2016-04-16 MED ORDER — LIDOCAINE HCL (CARDIAC) 20 MG/ML IV SOLN
INTRAVENOUS | Status: DC | PRN
  Administered 2016-04-16: 60 mg via INTRAVENOUS

## 2016-04-16 MED ORDER — SODIUM CHLORIDE 0.9 % IV SOLN
INTRAVENOUS | Status: DC | PRN
  Administered 2016-04-16: 40 mL via INTRAMUSCULAR

## 2016-04-16 MED ORDER — KETAMINE HCL 50 MG/ML IJ SOLN
INTRAMUSCULAR | Status: DC | PRN
  Administered 2016-04-16: 35 mg via INTRAMUSCULAR

## 2016-04-16 MED ORDER — FENTANYL CITRATE (PF) 100 MCG/2ML IJ SOLN
INTRAMUSCULAR | Status: DC | PRN
  Administered 2016-04-16 (×3): 25 ug via INTRAVENOUS
  Administered 2016-04-16: 50 ug via INTRAVENOUS

## 2016-04-16 MED ORDER — PROPOFOL 10 MG/ML IV BOLUS
INTRAVENOUS | Status: DC | PRN
  Administered 2016-04-16: 150 mg via INTRAVENOUS

## 2016-04-16 MED ORDER — SODIUM CHLORIDE 0.9 % IV SOLN
INTRAVENOUS | Status: DC
  Administered 2016-04-16: 09:00:00 via INTRAVENOUS

## 2016-04-16 MED ORDER — SODIUM CHLORIDE FLUSH 0.9 % IV SOLN
INTRAVENOUS | Status: AC
  Filled 2016-04-16: qty 10

## 2016-04-16 MED ORDER — ACETAMINOPHEN 10 MG/ML IV SOLN
INTRAVENOUS | Status: DC | PRN
  Administered 2016-04-16: 1000 mg via INTRAVENOUS

## 2016-04-16 MED ORDER — PHENYLEPHRINE HCL 10 MG/ML IJ SOLN
INTRAMUSCULAR | Status: DC | PRN
  Administered 2016-04-16: 150 ug via INTRAVENOUS
  Administered 2016-04-16: 50 ug via INTRAVENOUS
  Administered 2016-04-16: 100 ug via INTRAVENOUS

## 2016-04-16 MED ORDER — HEPARIN SODIUM (PORCINE) 5000 UNIT/ML IJ SOLN
INTRAMUSCULAR | Status: AC
  Filled 2016-04-16: qty 1

## 2016-04-16 MED ORDER — EPHEDRINE SULFATE 50 MG/ML IJ SOLN
INTRAMUSCULAR | Status: DC | PRN
  Administered 2016-04-16: 5 mg via INTRAVENOUS

## 2016-04-16 MED ORDER — ONDANSETRON HCL 4 MG/2ML IJ SOLN
INTRAMUSCULAR | Status: DC | PRN
  Administered 2016-04-16: 4 mg via INTRAVENOUS

## 2016-04-16 MED ORDER — CEFAZOLIN SODIUM-DEXTROSE 2-4 GM/100ML-% IV SOLN
INTRAVENOUS | Status: AC
  Filled 2016-04-16: qty 100

## 2016-04-16 MED ORDER — CHLORHEXIDINE GLUCONATE CLOTH 2 % EX PADS: 6.0000 | MEDICATED_PAD | Freq: Once | CUTANEOUS | Status: DC

## 2016-04-16 MED ORDER — CEFAZOLIN SODIUM-DEXTROSE 2-4 GM/100ML-% IV SOLN
2.0000 g | INTRAVENOUS | Status: AC
  Administered 2016-04-16: 2 g via INTRAVENOUS

## 2016-04-16 MED ORDER — GLYCOPYRROLATE 0.2 MG/ML IJ SOLN
INTRAMUSCULAR | Status: DC | PRN
  Administered 2016-04-16: 0.2 mg via INTRAVENOUS

## 2016-04-16 MED ORDER — FENTANYL CITRATE (PF) 100 MCG/2ML IJ SOLN
25.0000 ug | INTRAMUSCULAR | Status: DC | PRN
  Administered 2016-04-16 (×3): 25 ug via INTRAVENOUS

## 2016-04-16 MED ORDER — FAMOTIDINE 20 MG PO TABS
20.0000 mg | ORAL_TABLET | Freq: Once | ORAL | Status: AC
  Administered 2016-04-16: 20 mg via ORAL

## 2016-04-16 MED ORDER — ACETAMINOPHEN 10 MG/ML IV SOLN
INTRAVENOUS | Status: AC
  Filled 2016-04-16: qty 100

## 2016-04-16 MED ORDER — ONDANSETRON HCL 4 MG/2ML IJ SOLN: 4.0000 mg | Freq: Once | INTRAMUSCULAR | Status: DC | PRN

## 2016-04-16 SURGICAL SUPPLY — 56 items
APPLIER CLIP 11 MED OPEN (CLIP)
APPLIER CLIP 9.375 SM OPEN (CLIP) ×2
BAG COUNTER SPONGE EZ (MISCELLANEOUS) ×2 IMPLANT
BAG DECANTER FOR FLEXI CONT (MISCELLANEOUS) ×2 IMPLANT
BLADE SURG SZ11 CARB STEEL (BLADE) ×2 IMPLANT
BOOT SUTURE AID YELLOW STND (SUTURE) ×2 IMPLANT
BRUSH SCRUB 4% CHG (MISCELLANEOUS) ×2 IMPLANT
CANISTER SUCT 1200ML W/VALVE (MISCELLANEOUS) ×2 IMPLANT
CHLORAPREP W/TINT 26ML (MISCELLANEOUS) ×2 IMPLANT
CLIP APPLIE 11 MED OPEN (CLIP) IMPLANT
CLIP APPLIE 9.375 SM OPEN (CLIP) ×1 IMPLANT
DERMABOND ADVANCED (GAUZE/BANDAGES/DRESSINGS) ×1
DERMABOND ADVANCED .7 DNX12 (GAUZE/BANDAGES/DRESSINGS) ×1 IMPLANT
DRESSING SURGICEL FIBRLLR 1X2 (HEMOSTASIS) ×1 IMPLANT
DRSG SURGICEL FIBRILLAR 1X2 (HEMOSTASIS) ×2
ELECT CAUTERY BLADE 6.4 (BLADE) ×2 IMPLANT
ELECT REM PT RETURN 9FT ADLT (ELECTROSURGICAL) ×2
ELECTRODE REM PT RTRN 9FT ADLT (ELECTROSURGICAL) ×1 IMPLANT
GLOVE BIO SURGEON STRL SZ7 (GLOVE) ×2 IMPLANT
GLOVE INDICATOR 7.5 STRL GRN (GLOVE) ×2 IMPLANT
GLOVE SURG SYN 8.0 (GLOVE) ×2 IMPLANT
GOWN STRL REUS W/ TWL LRG LVL3 (GOWN DISPOSABLE) ×2 IMPLANT
GOWN STRL REUS W/ TWL XL LVL3 (GOWN DISPOSABLE) ×1 IMPLANT
GOWN STRL REUS W/TWL LRG LVL3 (GOWN DISPOSABLE) ×2
GOWN STRL REUS W/TWL XL LVL3 (GOWN DISPOSABLE) ×1
GRAFT PROPATEN STD WALL 4 7X45 (Vascular Products) ×2 IMPLANT
IV NS 500ML (IV SOLUTION) ×1
IV NS 500ML BAXH (IV SOLUTION) ×1 IMPLANT
KIT RM TURNOVER STRD PROC AR (KITS) ×2 IMPLANT
LABEL OR SOLS (LABEL) ×2 IMPLANT
LOOP RED MAXI  1X406MM (MISCELLANEOUS) ×2
LOOP VESSEL MAXI 1X406 RED (MISCELLANEOUS) ×2 IMPLANT
LOOP VESSEL MINI 0.8X406 BLUE (MISCELLANEOUS) ×2 IMPLANT
LOOPS BLUE MINI 0.8X406MM (MISCELLANEOUS) ×2
NEEDLE FILTER BLUNT 18X 1/2SAF (NEEDLE) ×1
NEEDLE FILTER BLUNT 18X1 1/2 (NEEDLE) ×1 IMPLANT
NS IRRIG 500ML POUR BTL (IV SOLUTION) ×2 IMPLANT
PACK EXTREMITY ARMC (MISCELLANEOUS) ×2 IMPLANT
PAD PREP 24X41 OB/GYN DISP (PERSONAL CARE ITEMS) ×2 IMPLANT
PUNCH SURGICAL ROTATE 2.7MM (MISCELLANEOUS) IMPLANT
STOCKINETTE STRL 4IN 9604848 (GAUZE/BANDAGES/DRESSINGS) ×2 IMPLANT
SUT GTX CV-6 30 (SUTURE) ×4 IMPLANT
SUT MNCRL+ 5-0 UNDYED PC-3 (SUTURE) ×1 IMPLANT
SUT MONOCRYL 5-0 (SUTURE) ×1
SUT PROLENE 6 0 BV (SUTURE) ×4 IMPLANT
SUT SILK 2 0 (SUTURE) ×1
SUT SILK 2 0 SH (SUTURE) ×2 IMPLANT
SUT SILK 2-0 18XBRD TIE 12 (SUTURE) ×1 IMPLANT
SUT SILK 3 0 (SUTURE) ×1
SUT SILK 3-0 18XBRD TIE 12 (SUTURE) ×1 IMPLANT
SUT SILK 4 0 (SUTURE) ×1
SUT SILK 4-0 18XBRD TIE 12 (SUTURE) ×1 IMPLANT
SUT VIC AB 3-0 SH 27 (SUTURE) ×1
SUT VIC AB 3-0 SH 27X BRD (SUTURE) ×1 IMPLANT
SYR 20CC LL (SYRINGE) ×2 IMPLANT
SYR 3ML LL SCALE MARK (SYRINGE) ×2 IMPLANT

## 2016-04-16 NOTE — Anesthesia Procedure Notes (Signed)
Procedure Name: LMA Insertion Date/Time: 04/16/2016 1:03 PM Performed by: Johnna Acosta Pre-anesthesia Checklist: Patient identified, Emergency Drugs available, Suction available, Patient being monitored and Timeout performed Patient Re-evaluated:Patient Re-evaluated prior to inductionOxygen Delivery Method: Circle system utilized Preoxygenation: Pre-oxygenation with 100% oxygen Intubation Type: IV induction LMA: LMA inserted LMA Size: 5.0 Tube type: Oral Number of attempts: 1 Placement Confirmation: positive ETCO2 and breath sounds checked- equal and bilateral Tube secured with: Tape Dental Injury: Teeth and Oropharynx as per pre-operative assessment

## 2016-04-16 NOTE — Anesthesia Preprocedure Evaluation (Signed)
Anesthesia Evaluation  Patient identified by MRN, date of birth, ID band Patient awake    Reviewed: Allergy & Precautions, NPO status , Patient's Chart, lab work & pertinent test results  History of Anesthesia Complications Negative for: history of anesthetic complications  Airway Mallampati: II       Dental  (+) Partial Upper, Partial Lower   Pulmonary neg pulmonary ROS,           Cardiovascular hypertension, Pt. on medications      Neuro/Psych negative neurological ROS     GI/Hepatic negative GI ROS, Neg liver ROS,   Endo/Other  diabetes, Type 2Hypothyroidism   Renal/GU Renal Insufficiency and ESRFRenal disease (stones)     Musculoskeletal   Abdominal   Peds  Hematology negative hematology ROS (+)   Anesthesia Other Findings   Reproductive/Obstetrics                             Anesthesia Physical Anesthesia Plan  ASA: IV  Anesthesia Plan: General   Post-op Pain Management:    Induction: Intravenous  Airway Management Planned: LMA  Additional Equipment:   Intra-op Plan:   Post-operative Plan:   Informed Consent: I have reviewed the patients History and Physical, chart, labs and discussed the procedure including the risks, benefits and alternatives for the proposed anesthesia with the patient or authorized representative who has indicated his/her understanding and acceptance.     Plan Discussed with:   Anesthesia Plan Comments:         Anesthesia Quick Evaluation

## 2016-04-16 NOTE — Op Note (Signed)
OPERATIVE NOTE   PROCEDURE: left brachial axillary arteriovenous graft placement  PRE-OPERATIVE DIAGNOSIS: End Stage Renal Disease  POST-OPERATIVE DIAGNOSIS: End Stage Renal Disease  SURGEON: Hortencia Pilar  ASSISTANT(S): None  ANESTHESIA: general  ESTIMATED BLOOD LOSS: <50 cc  FINDING(S): Small axillary vein measuring 5 mm  SPECIMEN(S):  none  INDICATIONS:   Alejandro Armstrong is a 82 y.o. male who presents with end stage renal disease.  The patient is scheduled for left brachial axillary AV graft placement.  The patient is aware the risks include but are not limited to: bleeding, infection, steal syndrome, nerve damage, ischemic monomelic neuropathy, failure to mature, and need for additional procedures.  The patient is aware of the risks of the procedure and elects to proceed forward.  DESCRIPTION: After full informed written consent was obtained from the patient, the patient was brought back to the operating room and placed supine upon the operating table.  Prior to induction, the patient received IV antibiotics.   After obtaining adequate anesthesia, the patient was then prepped and draped in the standard fashion for a left arm access procedure.    A linear incision was then created along the medial border of the biceps muscle just proximal to the antecubital crease and the brachial artery which was exposed through. The brachial artery was then looped proximally and distally with Silastic Vesseloops. Side branches were controlled with 4-0 silk ties.  Attention was then turned to the exposure of the axillary vein. Linear incision was then created medial to the proximal portion of the biceps at the level of the anterior axillary crease. The axillary vein was exposed and again looped proximally and distally with Silastic vessel loops. Associated tributaries were also controlled with Silastic Vesseloops.  The Gore tunneler was then delivered onto the field and a  subcutaneous path was made from the arterial incision to the venous incision. A 4-7 tapered PTFE propatent graft by Simeon Craft was then pulled through the subcutaneous tunnel. The arterial 4 mm portion was then approximated to the brachial artery. Brachial artery was controlled proximally and distally with the Silastic Vesseloops. Arteriotomy was made with an 11 blade scalpel and extended with Potts scissors and a 6-0 Prolene stay suture was placed. End graft to side brachial artery anastomosis was then fashioned with running CV 6 suture. Flushing maneuvers were performed suture line was hemostatic and the graft was then assessed for proper position and ease of future cannulation. Heparinized saline was infused into the vein and the graft was clamped with a vascular clamp. With the graft pressurized it was approximated to the axillary vein in its native bed and then marked with a surgical marker. The vein was then delivered into the surgical field and controlled with the Silastic vessel loops. Venotomy was then made with an 11 blade scalpel and extended with Potts scissors and a 6-0 Prolene suture was used as stay suture. The the graft was then sewn to the vein in an end graft to side vein fashion using running CV 6 suture.  Flushing maneuvers were performed and the artery was allowed to forward and back bleed.  Flow was then established through the AV graft  There was good  thrill in the venous outflow, and there was 1+ palpable radial pulse.  At this point, I irrigated out the surgical wounds.  There was no further active bleeding.  The subcutaneous tissue was reapproximated with a running stitch of 3-0 Vicryl.  The skin was then reapproximated with a  running subcuticular stitch of 4-0 Vicryl.  The skin was then cleaned, dried, and reinforced with Dermabond.    The patient tolerated this procedure well.   COMPLICATIONS: None  CONDITION: [facility]  Office:  [phone]   04/16/2016, 2:49 PM

## 2016-04-16 NOTE — Transfer of Care (Signed)
Immediate Anesthesia Transfer of Care Note  Patient: Alejandro Armstrong  Procedure(s) Performed: Procedure(s): INSERTION OF ARTERIOVENOUS (AV) GORE-TEX GRAFT ARM (Left)  Patient Location: PACU  Anesthesia Type:General  Level of Consciousness: awake  Airway & Oxygen Therapy: Patient Spontanous Breathing and Patient connected to nasal cannula oxygen  Post-op Assessment: Report given to RN and Post -op Vital signs reviewed and stable  Post vital signs: Reviewed and stable  Last Vitals:  Vitals:   04/16/16 0854 04/16/16 1453  BP: (!) 151/80 131/71  Pulse: 60 78  Resp: 16 11  Temp: 36.4 C 36.2 C    Last Pain:  Vitals:   04/16/16 0854  TempSrc: Oral         Complications: No apparent anesthesia complications

## 2016-04-16 NOTE — Discharge Instructions (Addendum)
AMBULATORY SURGERY  DISCHARGE INSTRUCTIONS   1) The drugs that you were given will stay in your system until tomorrow so for the next 24 hours you should not:  A) Drive an automobile B) Make any legal decisions C) Drink any alcoholic beverage   2) You may resume regular meals tomorrow.  Today it is better to start with liquids and gradually work up to solid foods.  You may eat anything you prefer, but it is better to start with liquids, then soup and crackers, and gradually work up to solid foods.   3) Please notify your doctor immediately if you have any unusual bleeding, trouble breathing, redness and pain at the surgery site, drainage, fever, or pain not relieved by medication.  Additional Instructions: per Dr. Delana Meyer, do not get incision site wet for two days.  Please contact your physician with any problems or Same Day Surgery at [phone], Monday through Friday 6 am to 4 pm, or [place] at [facility] number at [phone].

## 2016-04-16 NOTE — H&P (Signed)
[facility] History & Physical Update  The patient was interviewed and re-examined.  The patient's previous History and Physical has been reviewed and is unchanged.  There is no change in the plan of care. We plan to proceed with the scheduled procedure.  Hortencia Pilar, MD  04/16/2016, 12:16 PM

## 2016-04-16 NOTE — Anesthesia Postprocedure Evaluation (Signed)
Anesthesia Post Note  Patient: Alejandro Armstrong  Procedure(s) Performed: Procedure(s) (LRB): INSERTION OF ARTERIOVENOUS (AV) GORE-TEX GRAFT ARM (Left)  Patient location during evaluation: PACU Anesthesia Type: General Level of consciousness: awake Pain management: pain level controlled Vital Signs Assessment: post-procedure vital signs reviewed and stable Respiratory status: spontaneous breathing Cardiovascular status: stable Anesthetic complications: no    Last Vitals:  Vitals:   04/16/16 0854 04/16/16 1453  BP: (!) 151/80 131/71  Pulse: 60 78  Resp: 16 11  Temp: 36.4 C 36.2 C    Last Pain:  Vitals:   04/16/16 0854  TempSrc: Oral                 VAN STAVEREN,Charolett Yarrow

## 2016-04-17 ENCOUNTER — Encounter: Payer: Self-pay | Admitting: Vascular Surgery

## 2016-04-30 ENCOUNTER — Encounter (INDEPENDENT_AMBULATORY_CARE_PROVIDER_SITE_OTHER): Payer: Self-pay | Admitting: Vascular Surgery

## 2016-04-30 ENCOUNTER — Ambulatory Visit (INDEPENDENT_AMBULATORY_CARE_PROVIDER_SITE_OTHER): Payer: Medicare HMO | Admitting: Vascular Surgery

## 2016-04-30 VITALS — BP 135/72 | HR 63 | Resp 18 | Ht 64.0 in | Wt 182.0 lb

## 2016-04-30 DIAGNOSIS — E1122 Type 2 diabetes mellitus with diabetic chronic kidney disease: Secondary | ICD-10-CM

## 2016-04-30 DIAGNOSIS — N289 Disorder of kidney and ureter, unspecified: Secondary | ICD-10-CM

## 2016-04-30 DIAGNOSIS — N189 Chronic kidney disease, unspecified: Secondary | ICD-10-CM

## 2016-04-30 DIAGNOSIS — N185 Chronic kidney disease, stage 5: Secondary | ICD-10-CM

## 2016-04-30 DIAGNOSIS — Z09 Encounter for follow-up examination after completed treatment for conditions other than malignant neoplasm: Secondary | ICD-10-CM | POA: Insufficient documentation

## 2016-04-30 NOTE — Progress Notes (Addendum)
Subjective:    Patient ID: Alejandro Armstrong, male    DOB: November 15, 1929, 82 y.o.   MRN: [id number] Chief Complaint  Patient presents with  . Re-evaluation    AV graft    Patient presents for his first post-operative visit. He is s/p left upper extremity brach-axillary graft creation. He is complaining of left hand pain and numbness since surgery. Incisions are healing well. Denies any fever, nausea or vomiting. Denies any ulcer / wound formation.    Review of Systems  Constitutional: Negative.   HENT: Negative.   Eyes: Negative.   Respiratory: Negative.   Cardiovascular:       Left hand pain and numbness  Gastrointestinal: Negative.   Endocrine: Negative.   Genitourinary: Negative.   Musculoskeletal: Negative.   Skin: Negative.   Allergic/Immunologic: Negative.   Neurological: Negative.   Hematological: Negative.   Psychiatric/Behavioral: Negative.       Objective:   Physical Exam  Constitutional: He is oriented to person, place, and time. He appears well-developed and well-nourished.  HENT:  Head: Normocephalic and atraumatic.  Right Ear: External ear normal.  Left Ear: External ear normal.  Eyes: Conjunctivae are normal. Pupils are equal, round, and reactive to light.  Neck: Normal range of motion.  Cardiovascular: Normal rate, regular rhythm, normal heart sounds and intact distal pulses.   Pulses:      Radial pulses are 2+ on the right side, and 1+ on the left side.       Dorsalis pedis pulses are 1+ on the right side, and 1+ on the left side.       Posterior tibial pulses are 1+ on the right side, and 1+ on the left side.  Left Upper Extremity Access: Good bruit and thrill. Hand cooler. Skin intact.   Pulmonary/Chest: Effort normal and breath sounds normal.  Abdominal: Soft. Bowel sounds are normal.  Musculoskeletal: Normal range of motion. He exhibits no edema.  Neurological: He is alert and oriented to person, place, and time.  Skin: Skin is warm and dry.    Psychiatric: He has a normal mood and affect. His behavior is normal. Judgment and thought content normal.   BP 135/72 (BP Location: Right Arm)   Pulse 63   Resp 18   Ht 5\' 4"  (1.626 m)   Wt 182 lb (82.6 kg)   BMI 31.24 kg/m   Past Medical History:  Diagnosis Date  . Hyperlipidemia   . Hypertension   . Hypothyroidism   . Idiopathic thrombocytopenia purpura ([place])   . Kidney stones   . Recurrent UTI   . Renal agenesis    discovered at age 48  . Type 2 diabetes mellitus ([place])   . Ureteral stricture    Social History   Social History  . Marital status: Married    Spouse name: [name]  . Number of children: [name]  . Years of education: [name]   Occupational History  . retired    Social History Main Topics  . Smoking status: Never Smoker  . Smokeless tobacco: Never Used  . Alcohol use No  . Drug use: No  . Sexual activity: No   Other Topics Concern  . Not on file   Social History Narrative  . No narrative on file   Past Surgical History:  Procedure Laterality Date  . APPENDECTOMY    . AV FISTULA PLACEMENT Left 04/16/2016   Procedure: INSERTION OF ARTERIOVENOUS (AV) GORE-TEX GRAFT ARM;  Surgeon: [name], MD;  Location: [facility]  ORS;  Service: Vascular;  Laterality: Left;  . CATARACT EXTRACTION W/ INTRAOCULAR LENS IMPLANT Bilateral 2014   done 1-2 months apart at [facility].  [name] FINGER SURGERY Left 2002   pinky finger  . KIDNEY STONE SURGERY    . NASAL SINUS SURGERY  1985  . PICC LINE PLACE PERIPHERAL ([place] HX) Right 08/2015  . PICC LINE REMOVAL ([place] HX) Right 09/2015   Family History  Problem Relation Age of Onset  . Cervical cancer Sister    No Known Allergies     Assessment & Plan:  Patient presents for his first post-operative visit. He is s/p left upper extremity brach-axillary graft creation on 04/16/16. He is complaining of left hand pain and numbness since surgery. Incisions are healing well. Denies any fever, nausea or vomiting. Denies any ulcer / wound  formation.   1. Postop check - New  s/p left upper extremity brach-axillary graft creation on 04/16/16, worrisome for left hand steal syndrome. Will order steal study to rule out.  2. Type 2 diabetes mellitus with stage 5 chronic kidney disease not on chronic dialysis, without long-term current use of insulin (HCC) - Stable Encouraged good control as its slows the progression of atherosclerotic and renal disease  3. Acute on chronic renal insufficiency - Stable Avoid nephrotoxic drug. Good hypertension and diabetic control to reduce strain on kidneys  Current Outpatient Prescriptions on File Prior to Visit  Medication Sig Dispense Refill  . amLODipine (NORVASC) 5 MG tablet Take 1 tablet (5 mg total) by mouth daily. (Patient taking differently: Take 5 mg by mouth every morning. ) 30 tablet 0  . calcitRIOL (ROCALTROL) 0.25 MCG capsule Take 0.25 mcg by mouth daily.    . ergocalciferol (VITAMIN D2) 50000 units capsule Take 50,000 Units by mouth once a week.    . ferrous sulfate 325 (65 FE) MG tablet Take 325 mg by mouth daily with breakfast.     . furosemide (LASIX) 40 MG tablet Take 40 mg by mouth every morning.     . hydrALAZINE (APRESOLINE) 25 MG tablet Take 25 mg by mouth 2 (two) times daily.    [name] HYDROcodone-acetaminophen (NORCO) 5-325 MG tablet Take 1-2 tablets by mouth every 6 (six) hours as needed for moderate pain. 60 tablet 0  . insulin glargine (LANTUS) 100 UNIT/ML injection Inject 0.1 mLs (10 Units total) into the skin at bedtime. (Patient taking differently: Inject 20 Units into the skin every morning. ) 10 mL 11  . levothyroxine (SYNTHROID, LEVOTHROID) 125 MCG tablet Take 125 mcg by mouth daily before breakfast.     . lisinopril (PRINIVIL,ZESTRIL) 20 MG tablet Take 20 mg by mouth every morning.     . Omega-3 1000 MG CAPS Take 1 capsule by mouth daily.     . sodium bicarbonate 650 MG tablet Take 1,300 mg by mouth 3 (three) times daily.      Current Facility-Administered Medications  on File Prior to Visit  Medication Dose Route Frequency Provider Last Rate Last Dose  . heparin lock flush 100 unit/mL  500 Units Intravenous Once [name], MD      . sodium chloride flush (NS) 0.9 % injection 10 mL  10 mL Intravenous PRN [name], MD      . sodium chloride flush (NS) 0.9 % injection 10 mL  10 mL Intravenous PRN [name], MD   10 mL at 08/16/15 0857   Steal Study conducted on 05/02/16 was notable for probable steal on left upper  extremity  Alejandro Scalisi A Gurman [place], PA-C

## 2016-05-02 ENCOUNTER — Other Ambulatory Visit (INDEPENDENT_AMBULATORY_CARE_PROVIDER_SITE_OTHER): Payer: Self-pay | Admitting: Vascular Surgery

## 2016-05-02 ENCOUNTER — Ambulatory Visit (INDEPENDENT_AMBULATORY_CARE_PROVIDER_SITE_OTHER): Payer: Medicare HMO

## 2016-05-02 DIAGNOSIS — Z09 Encounter for follow-up examination after completed treatment for conditions other than malignant neoplasm: Secondary | ICD-10-CM

## 2016-05-02 DIAGNOSIS — M79602 Pain in left arm: Secondary | ICD-10-CM | POA: Diagnosis not present

## 2016-05-05 ENCOUNTER — Telehealth (INDEPENDENT_AMBULATORY_CARE_PROVIDER_SITE_OTHER): Payer: Self-pay | Admitting: Vascular Surgery

## 2016-05-05 NOTE — Telephone Encounter (Signed)
Called to discuss results of steal study with patient. Left message asking for call back.

## 2016-05-07 ENCOUNTER — Other Ambulatory Visit (INDEPENDENT_AMBULATORY_CARE_PROVIDER_SITE_OTHER): Payer: Self-pay | Admitting: Vascular Surgery

## 2016-05-07 ENCOUNTER — Telehealth (INDEPENDENT_AMBULATORY_CARE_PROVIDER_SITE_OTHER): Payer: Self-pay | Admitting: Vascular Surgery

## 2016-05-07 ENCOUNTER — Other Ambulatory Visit
Admission: RE | Admit: 2016-05-07 | Discharge: 2016-05-07 | Disposition: A | Discharge status: Home / Self Care | Payer: Medicare HMO | Source: Ambulatory Visit | Attending: Vascular Surgery | Admitting: Vascular Surgery

## 2016-05-07 DIAGNOSIS — T82898A Other specified complication of vascular prosthetic devices, implants and grafts, initial encounter: Secondary | ICD-10-CM | POA: Insufficient documentation

## 2016-05-07 DIAGNOSIS — Y832 Surgical operation with anastomosis, bypass or graft as the cause of abnormal reaction of the patient, or of later complication, without mention of misadventure at the time of the procedure: Secondary | ICD-10-CM | POA: Diagnosis not present

## 2016-05-07 LAB — POTASSIUM: Potassium: 5 mmol/L (ref 3.5–5.1)

## 2016-05-07 LAB — CREATININE, SERUM
CREATININE: 6.1 mg/dL — AB (ref 0.61–1.24)
GFR calc Af Amer: 9 mL/min — ABNORMAL LOW (ref >60)
GFR calc non Af Amer: 8 mL/min — ABNORMAL LOW (ref >60)

## 2016-05-07 LAB — BUN: BUN: 53 mg/dL — AB (ref 6–20)

## 2016-05-07 NOTE — Telephone Encounter (Signed)
The patient and I had a conversation reviewed steal syndrome and the need for a fistulogram this coming Tuesday. Preop and the procedure were discussed in detail. All his questions were answered. He continues to want to proceed with the procedure.

## 2016-05-07 NOTE — Telephone Encounter (Signed)
Called patient to answer questions about his upcoming procedure. Left message.

## 2016-05-07 NOTE — Telephone Encounter (Signed)
Spoke with wife who wasn't at home with husband. Answered all of her questions about procedure and encouraged her to have her husband call our office and ask for me if he had any additional questions.

## 2016-05-12 MED ORDER — DEXTROSE 5 % IV SOLN
1.5000 g | INTRAVENOUS | Status: AC
  Administered 2016-05-13: 1.5 g via INTRAVENOUS

## 2016-05-13 ENCOUNTER — Ambulatory Visit
Admission: RE | Admit: 2016-05-13 | Discharge: 2016-05-13 | Disposition: A | Discharge status: Home / Self Care | Payer: Medicare HMO | Source: Ambulatory Visit | Attending: Vascular Surgery | Admitting: Vascular Surgery

## 2016-05-13 ENCOUNTER — Encounter
Admission: RE | Disposition: A | Discharge status: Home / Self Care | Payer: Self-pay | Source: Ambulatory Visit | Attending: Vascular Surgery

## 2016-05-13 ENCOUNTER — Encounter: Payer: Self-pay | Admitting: *Deleted

## 2016-05-13 DIAGNOSIS — E785 Hyperlipidemia, unspecified: Secondary | ICD-10-CM | POA: Diagnosis not present

## 2016-05-13 DIAGNOSIS — Z794 Long term (current) use of insulin: Secondary | ICD-10-CM | POA: Insufficient documentation

## 2016-05-13 DIAGNOSIS — T82848A Pain from vascular prosthetic devices, implants and grafts, initial encounter: Secondary | ICD-10-CM | POA: Insufficient documentation

## 2016-05-13 DIAGNOSIS — D693 Immune thrombocytopenic purpura: Secondary | ICD-10-CM | POA: Diagnosis not present

## 2016-05-13 DIAGNOSIS — Q602 Renal agenesis, unspecified: Secondary | ICD-10-CM | POA: Insufficient documentation

## 2016-05-13 DIAGNOSIS — Z87442 Personal history of urinary calculi: Secondary | ICD-10-CM | POA: Diagnosis not present

## 2016-05-13 DIAGNOSIS — Z9841 Cataract extraction status, right eye: Secondary | ICD-10-CM | POA: Diagnosis not present

## 2016-05-13 DIAGNOSIS — Z9889 Other specified postprocedural states: Secondary | ICD-10-CM | POA: Diagnosis not present

## 2016-05-13 DIAGNOSIS — N185 Chronic kidney disease, stage 5: Secondary | ICD-10-CM | POA: Insufficient documentation

## 2016-05-13 DIAGNOSIS — Z87440 Personal history of urinary (tract) infections: Secondary | ICD-10-CM | POA: Insufficient documentation

## 2016-05-13 DIAGNOSIS — Y832 Surgical operation with anastomosis, bypass or graft as the cause of abnormal reaction of the patient, or of later complication, without mention of misadventure at the time of the procedure: Secondary | ICD-10-CM | POA: Insufficient documentation

## 2016-05-13 DIAGNOSIS — Z992 Dependence on renal dialysis: Secondary | ICD-10-CM | POA: Insufficient documentation

## 2016-05-13 DIAGNOSIS — Z8049 Family history of malignant neoplasm of other genital organs: Secondary | ICD-10-CM | POA: Insufficient documentation

## 2016-05-13 DIAGNOSIS — I120 Hypertensive chronic kidney disease with stage 5 chronic kidney disease or end stage renal disease: Secondary | ICD-10-CM | POA: Diagnosis not present

## 2016-05-13 DIAGNOSIS — Z9842 Cataract extraction status, left eye: Secondary | ICD-10-CM | POA: Diagnosis not present

## 2016-05-13 DIAGNOSIS — E1122 Type 2 diabetes mellitus with diabetic chronic kidney disease: Secondary | ICD-10-CM | POA: Insufficient documentation

## 2016-05-13 DIAGNOSIS — E039 Hypothyroidism, unspecified: Secondary | ICD-10-CM | POA: Insufficient documentation

## 2016-05-13 DIAGNOSIS — G458 Other transient cerebral ischemic attacks and related syndromes: Secondary | ICD-10-CM | POA: Diagnosis not present

## 2016-05-13 HISTORY — PX: PERIPHERAL VASCULAR CATHETERIZATION: SHX172C

## 2016-05-13 LAB — GLUCOSE, CAPILLARY: Glucose-Capillary: 105 mg/dL — ABNORMAL HIGH (ref 65–99)

## 2016-05-13 SURGERY — UPPER EXTREMITY ANGIOGRAPHY
Site: Arm Upper | Laterality: Left

## 2016-05-13 MED ORDER — FENTANYL CITRATE (PF) 100 MCG/2ML IJ SOLN
INTRAMUSCULAR | Status: DC | PRN
  Administered 2016-05-13 (×2): 50 ug via INTRAVENOUS

## 2016-05-13 MED ORDER — METHYLPREDNISOLONE SODIUM SUCC 125 MG IJ SOLR: 125.0000 mg | INTRAMUSCULAR | Status: DC | PRN

## 2016-05-13 MED ORDER — HEPARIN (PORCINE) IN NACL 2-0.9 UNIT/ML-% IJ SOLN
INTRAMUSCULAR | Status: AC
  Filled 2016-05-13: qty 1000

## 2016-05-13 MED ORDER — LIDOCAINE HCL (PF) 1 % IJ SOLN
INTRAMUSCULAR | Status: AC
  Filled 2016-05-13: qty 30

## 2016-05-13 MED ORDER — SODIUM CHLORIDE 0.9 % IV SOLN
INTRAVENOUS | Status: DC
  Administered 2016-05-13: 07:00:00 via INTRAVENOUS

## 2016-05-13 MED ORDER — NITROGLYCERIN 5 MG/ML IV SOLN
INTRAVENOUS | Status: AC
  Filled 2016-05-13: qty 10

## 2016-05-13 MED ORDER — MIDAZOLAM HCL 5 MG/5ML IJ SOLN
INTRAMUSCULAR | Status: AC
  Filled 2016-05-13: qty 5

## 2016-05-13 MED ORDER — FAMOTIDINE 20 MG PO TABS: 40.0000 mg | ORAL_TABLET | ORAL | Status: DC | PRN

## 2016-05-13 MED ORDER — FENTANYL CITRATE (PF) 100 MCG/2ML IJ SOLN
INTRAMUSCULAR | Status: AC
  Filled 2016-05-13: qty 2

## 2016-05-13 MED ORDER — IOPAMIDOL (ISOVUE-300) INJECTION 61%
INTRAVENOUS | Status: DC | PRN
  Administered 2016-05-13: 88 mL via INTRA_ARTERIAL

## 2016-05-13 MED ORDER — NITROGLYCERIN 1 MG/10 ML FOR IR/CATH LAB
INTRA_ARTERIAL | Status: DC | PRN
  Administered 2016-05-13: 500 ug
  Administered 2016-05-13: 250 ug

## 2016-05-13 MED ORDER — HEPARIN SODIUM (PORCINE) 1000 UNIT/ML IJ SOLN
INTRAMUSCULAR | Status: AC
  Filled 2016-05-13: qty 1

## 2016-05-13 MED ORDER — HYDROMORPHONE HCL 1 MG/ML IJ SOLN: 1.0000 mg | Freq: Once | INTRAMUSCULAR | Status: DC

## 2016-05-13 MED ORDER — SODIUM CHLORIDE 0.9 % IJ SOLN
INTRAMUSCULAR | Status: AC
  Filled 2016-05-13: qty 50

## 2016-05-13 MED ORDER — HEPARIN SODIUM (PORCINE) 1000 UNIT/ML IJ SOLN
INTRAMUSCULAR | Status: DC | PRN
  Administered 2016-05-13 (×2): 2000 [IU] via INTRAVENOUS

## 2016-05-13 MED ORDER — MIDAZOLAM HCL 2 MG/2ML IJ SOLN
INTRAMUSCULAR | Status: DC | PRN
  Administered 2016-05-13: 2 mg via INTRAVENOUS
  Administered 2016-05-13: 1 mg via INTRAVENOUS

## 2016-05-13 MED ORDER — ONDANSETRON HCL 4 MG/2ML IJ SOLN: 4.0000 mg | Freq: Four times a day (QID) | INTRAMUSCULAR | Status: DC | PRN

## 2016-05-13 SURGICAL SUPPLY — 23 items
BALLN LUTONIX DCB 5X40X130 (BALLOONS) ×4
BALLN LUTONIX DCB 6X40X130 (BALLOONS) ×4
BALLN ULTRVRSE 3X40X130C (BALLOONS) ×4
BALLOON LUTONIX DCB 5X40X130 (BALLOONS) ×2 IMPLANT
BALLOON LUTONIX DCB 6X40X130 (BALLOONS) ×2 IMPLANT
BALLOON ULTRVRSE 3X40X130C (BALLOONS) ×2 IMPLANT
CATH ANGIO 5F 100CM .035 PIG (CATHETERS) ×4 IMPLANT
CATH GWIRE MARINER STRGHT 4FR (CATHETERS) ×4 IMPLANT
CATH H1 100CM (CATHETERS) ×4 IMPLANT
DEVICE PRESTO INFLATION (MISCELLANEOUS) ×4 IMPLANT
DEVICE STARCLOSE SE CLOSURE (Vascular Products) ×4 IMPLANT
DEVICE TORQUE (MISCELLANEOUS) ×4 IMPLANT
GLIDEWIRE ANGLED SS 035X260CM (WIRE) ×4 IMPLANT
PACK ANGIOGRAPHY (CUSTOM PROCEDURE TRAY) ×4 IMPLANT
SET INTRO CAPELLA COAXIAL (SET/KITS/TRAYS/PACK) ×4 IMPLANT
SHEATH BRITE TIP 5FRX11 (SHEATH) ×4 IMPLANT
SHEATH SHUTTLE SELECT 6F (SHEATH) ×4 IMPLANT
SYR MEDRAD MARK V 150ML (SYRINGE) ×4 IMPLANT
TOWEL OR 17X26 4PK STRL BLUE (TOWEL DISPOSABLE) ×4 IMPLANT
TUBING CONTRAST HIGH PRESS 72 (TUBING) ×4 IMPLANT
VALVE CHECKFLO PERFORMER (SHEATH) ×4 IMPLANT
WIRE HI TORQ VERSACORE 300 (WIRE) ×4 IMPLANT
WIRE J 3MM .035X145CM (WIRE) ×4 IMPLANT

## 2016-05-13 NOTE — H&P (Signed)
[facility] History & Physical Update  The patient was interviewed and re-examined.  The patient's previous History and Physical has been reviewed and is unchanged.  There is no change in the plan of care. We plan to proceed with the scheduled procedure.  Hortencia Pilar, MD  05/13/2016, 7:49 AM

## 2016-05-13 NOTE — Op Note (Signed)
[facility]  Percutaneous Study/Intervention Procedural Note   Date of Surgery: 05/13/2016,9:51 AM  Surgeon: Hortencia Pilar  Pre-operative Diagnosis: Left arm steal syndrome following AV graft placement; painful left upper extremity  Post-operative diagnosis:  Same; atherosclerotic occlusive disease left upper extremity  Procedure(s) Performed:  1.  Arch aortogram  2.  Left upper extremity angiography third order catheter placement  3.  Additional third order catheter placement  4.  Percutaneous transluminal angioplasty left brachial artery to 6 mm  5.  Percutaneous transluminal angioplasty left radial artery to 3 mm  6.  Star close right common femoral artery    Anesthesia: Conscious sedation was administered under my direct supervision. IV Versed plus fentanyl were utilized. Continuous ECG, pulse oximetry and blood pressure was monitored throughout the entire procedure.  Conscious sedation was administered for a total of 80 minutes.  Sheath: 6 French 90 cm shuttle sheath  Contrast: 88 cc   Fluoroscopy Time: 10.3 minutes  Indications:  The patient presented to the office in follow-up status post left arm brachial axillary dialysis graft. He is complaining of pain in his left hand as well as a numbness. Noninvasive studies as well as physical exam support steal syndrome. He is therefore undergoing angiography with the hope to salvage his AV access and eliminate steal.  Procedure:  Alejandro Armstrong is a 82 y.o. male who was identified and appropriate procedural time out was performed.  The patient was then placed supine on the table and prepped and draped in the usual sterile fashion.    Ultrasound was used to evaluate the right common femoral artery.  It is echolucent and pulsatile indicating it is patent .   A micropuncture needle was used to access the right common femoral artery under direct ultrasound guidance and an image was recorded for the  permanent record.  A microwire was then advanced under fluoroscopic guidance followed by the micro-sheath.  A 0.035 J wire was advanced without resistance and a 5Fr sheath was placed.    Pigtail catheter was then advanced to the level of ascending aorta and and LAO projection of the aortic arch was obtained. The pigtail catheter was exchanged for a H1 catheter. The left subclavian artery was then selected and the catheter and wire advanced. Hand injection of contrast was then used to create images of the subclavian axillary and brachial arteries was obtained.  This showed the left subclavian axillary and proximal two thirds of the brachial artery are widely patent. In the oblique view the brachial artery at the level of the anastomosis demonstrates 80% narrowing. The AV graft is widely patent and there are surgical clips and moderate narrowing at the anastomosis of the graft consistent with a previous banding which is intact. Distal to the anastomosis the brachial artery appears somewhat irregular but patent. There is poor filling of the radial and ulnar arteries but they are patent at their origins.    A total of 4000 Units of heparin was then given and the 5 French sheath was exchanged for a 90 cm shuttle sheath.  Glidewire and catheter were then negotiated distally. The Glidewire was exchanged for a versa core wire.  The lesion identified in the brachial artery was then treated with a 5 x 4 Lutonix balloon and then a 6 x 4. Follow-up imaging demonstrated improvement in this lesion with less than 10% residual stenosis.   The catheter was then reintroduced over the wire and negotiated into the radial artery. Selective injection  of the radial artery was then performed. This showed a string sign in the proximal one third beyond that the artery is patent.  The wire was negotiated distal to the stenosis and a 3 x 4 Ultraversed balloon was used to treat the stenosis.  2 separate inflations were required each  28 atm for 1 minute. Imaging was performed between each inflation   After successful treatment of the radial the catheter was pulled back the Glidewire reintroduced and the catheter negotiated into the ulnar artery. This showed the ulnar and interosseous arteries are patent and filled the hand.  After review of the images the catheter was removed over wire and an RAO view of the groin was obtained. StarClose device was deployed without difficulty.  Findings:   Arch aortogram:  Arch aortogram demonstrates patency of the arch type 1 anatomy is noted. The origins of the great vessels are widely patent. The great vessels are very tortuous  Left Upper Extremity:  The proximal two thirds of the brachial artery is widely patent and there is high-grade stricture noted at the anastomosis. AV graft is patent and has already been banded. The banding is intact. The trifurcation is widely patent there is a high-grade stricture in the radial artery but otherwise the radial arteries patent and fills the hand. Ulnar and interosseous arteries are patent and filled the hand.  Following and plasty at 2 described locations there is now improved flow distally.    Disposition: Patient was taken to the recovery room in stable condition having tolerated the procedure well.  Alejandro Armstrong 05/13/2016,9:51 AM

## 2016-06-03 ENCOUNTER — Ambulatory Visit (INDEPENDENT_AMBULATORY_CARE_PROVIDER_SITE_OTHER): Payer: Medicare HMO | Admitting: Vascular Surgery

## 2016-06-19 ENCOUNTER — Encounter: Payer: Self-pay | Admitting: Vascular Surgery

## 2016-06-24 ENCOUNTER — Inpatient Hospital Stay: Payer: Medicare HMO | Attending: Oncology

## 2016-06-24 DIAGNOSIS — D696 Thrombocytopenia, unspecified: Secondary | ICD-10-CM | POA: Diagnosis not present

## 2016-06-24 DIAGNOSIS — D693 Immune thrombocytopenic purpura: Secondary | ICD-10-CM

## 2016-06-24 LAB — CBC WITH DIFFERENTIAL/PLATELET
BASOS PCT: 1 %
Basophils Absolute: 0.1 10*3/uL (ref 0–0.1)
Eosinophils Absolute: 0.4 10*3/uL (ref 0–0.7)
Eosinophils Relative: 8 %
HEMATOCRIT: 34.5 % — AB (ref 40.0–52.0)
HEMOGLOBIN: 11.8 g/dL — AB (ref 13.0–18.0)
LYMPHS ABS: 2.2 10*3/uL (ref 1.0–3.6)
LYMPHS PCT: 42 %
MCH: 33.9 pg (ref 26.0–34.0)
MCHC: 34.3 g/dL (ref 32.0–36.0)
MCV: 98.9 fL (ref 80.0–100.0)
MONOS PCT: 10 %
Monocytes Absolute: 0.5 10*3/uL (ref 0.2–1.0)
NEUTROS ABS: 2.1 10*3/uL (ref 1.4–6.5)
NEUTROS PCT: 39 %
Platelets: 150 10*3/uL (ref 150–440)
RBC: 3.49 MIL/uL — ABNORMAL LOW (ref 4.40–5.90)
RDW: 15.1 % — ABNORMAL HIGH (ref 11.5–14.5)
WBC: 5.2 10*3/uL (ref 3.8–10.6)

## 2016-06-25 ENCOUNTER — Other Ambulatory Visit: Payer: Medicare HMO

## 2016-09-23 ENCOUNTER — Other Ambulatory Visit: Payer: Medicare HMO

## 2016-09-23 ENCOUNTER — Ambulatory Visit: Payer: Medicare HMO | Admitting: Oncology

## 2016-09-24 ENCOUNTER — Ambulatory Visit: Payer: Medicare HMO | Admitting: Oncology

## 2016-09-24 ENCOUNTER — Other Ambulatory Visit: Payer: Medicare HMO

## 2016-10-06 NOTE — Progress Notes (Signed)
[place] [facility]  [phone] [phone]  ID: [name] OB: June 29, 1929  MR#: [id number]  WUX#:[id number]  Patient Care Team: Casilda Carls as PCP - General (Internal Medicine)  CHIEF COMPLAINT: Bone marrow biopsy confirmed ITP.  INTERVAL HISTORY: Patient returns to clinic today for further evaluation and laboratory work. He continues to feel well and is asymptomatic. He does not complain of weakness or fatigue today. He denies any easy bleeding or bruising. He denies any fevers or recent illnesses. He has no neurologic complaints. He denies any chest pain or shortness of breath. He denies any nausea, vomiting, constipation, or diarrhea. He has no urinary complaints. Patient offers no specific complaints today.   REVIEW OF SYSTEMS:   Review of Systems  Constitutional: Negative for fever, malaise/fatigue and weight loss.  Respiratory: Negative.  Negative for cough and shortness of breath.   Cardiovascular: Negative.  Negative for chest pain and leg swelling.  Gastrointestinal: Negative.  Negative for blood in stool and melena.  Genitourinary: Negative.   Musculoskeletal: Negative.   Skin: Negative.  Negative for rash.  Neurological: Negative.  Negative for sensory change, weakness and headaches.  Endo/Heme/Allergies: Does not bruise/bleed easily.  Psychiatric/Behavioral: Negative.  The patient is not nervous/anxious.     As per HPI. Otherwise, a complete review of systems is negative.  PAST MEDICAL HISTORY: Past Medical History:  Diagnosis Date  . Hyperlipidemia   . Hypertension   . Hypothyroidism   . Idiopathic thrombocytopenia purpura ([place])   . Kidney stones   . Recurrent UTI   . Renal agenesis    discovered at age 83  . Type 2 diabetes mellitus ([place])   . Ureteral stricture     PAST SURGICAL HISTORY: Past Surgical History:  Procedure Laterality Date  . APPENDECTOMY    . AV FISTULA PLACEMENT Left 04/16/2016   Procedure:  INSERTION OF ARTERIOVENOUS (AV) GORE-TEX GRAFT ARM;  Surgeon: [name], MD;  Location: [facility] ORS;  Service: Vascular;  Laterality: Left;  . CATARACT EXTRACTION W/ INTRAOCULAR LENS IMPLANT Bilateral 2014   done 81-2 months [facility] at [facility].  [name] FINGER SURGERY Left 2002   pinky finger  . KIDNEY STONE SURGERY    . NASAL SINUS SURGERY  1985  . PERIPHERAL VASCULAR CATHETERIZATION  05/13/2016   Procedure: Upper Extremity Angiography;  Surgeon: [name], MD;  Location: [place] [facility];  Service: Cardiovascular;;  . PERIPHERAL VASCULAR CATHETERIZATION  05/13/2016   Procedure: Upper Extremity Intervention;  Surgeon: [name], MD;  Location: [place] [facility];  Service: Cardiovascular;;  . PICC LINE PLACE PERIPHERAL ([place] HX) Right 81/2017  . PICC LINE REMOVAL ([place] HX) Right 81/2017    FAMILY HISTORY: No reported history of malignancy or chronic disease.      ADVANCED DIRECTIVES:    HEALTH MAINTENANCE: Social History  Substance Use Topics  . Smoking status: Never Smoker  . Smokeless tobacco: Never Used  . Alcohol use No     Colonoscopy:  PAP:  Bone density:  Lipid panel:  No Known Allergies  Current Outpatient Prescriptions  Medication Sig Dispense Refill  . amLODipine (NORVASC) 5 MG tablet Take 1 tablet (5 mg total) by mouth daily. (Patient taking differently: Take 5 mg by mouth every morning. ) 30 tablet 0  . atorvastatin (LIPITOR) 10 MG tablet Take 10 mg by mouth daily.    . Carboxymethylcellulose Sodium (LUBRICANT EYE DROPS OP) Apply 1 drop to eye daily as needed (dry eyes).    [name]  furosemide (LASIX) 40 MG tablet Take 40 mg by mouth every morning.     . hydrALAZINE (APRESOLINE) 25 MG tablet Take 25 mg by mouth 2 (two) times daily.    . insulin glargine (LANTUS) 100 UNIT/ML injection Inject 0.1 mLs (10 Units total) into the skin at bedtime. (Patient taking differently: Inject 20 Units into the skin daily. ) 10 mL 11  . levothyroxine (SYNTHROID,  LEVOTHROID) 112 MCG tablet Take 112 mcg by mouth daily before breakfast.    . lidocaine-prilocaine (EMLA) cream APPLY TO ACCESS SITE 30 MINUTES PRIOR TO DIALYSIS  11  . lisinopril (PRINIVIL,ZESTRIL) 20 MG tablet Take 20 mg by mouth every morning.     [name] MELATONIN PO Take by mouth.    . Omega-3 1000 MG CAPS Take 4 capsules by mouth daily.     . calcitRIOL (ROCALTROL) 0.25 MCG capsule Take 0.25 mcg by mouth daily.    . Calcium Carb-Cholecalciferol (CALCIUM + D3 PO) Take 1 tablet by mouth daily.    . ciprofloxacin (CIPRO) 250 MG tablet Take 250 mg by mouth daily.     . ergocalciferol (VITAMIN D2) 50000 units capsule Take 50,000 Units by mouth once a week.    . ferrous sulfate 325 (65 FE) MG tablet Take 325 mg by mouth daily with breakfast.     . HYDROcodone-acetaminophen (NORCO) 5-325 MG tablet Take 1-2 tablets by mouth every 6 (six) hours as needed for moderate pain. (Patient not taking: Reported on 05/06/2016) 60 tablet 0  . sodium bicarbonate 650 MG tablet Take 1,300 mg by mouth 3 (three) times daily.      No current facility-administered medications for this visit.    Facility-Administered Medications Ordered in Other Visits  Medication Dose Route Frequency Provider Last Rate Last Dose  . heparin lock flush 100 unit/mL  500 Units Intravenous Once [name], MD      . sodium chloride flush (NS) 0.9 % injection 10 mL  10 mL Intravenous PRN [name], MD      . sodium chloride flush (NS) 0.9 % injection 10 mL  10 mL Intravenous PRN [name], MD   10 mL at 08/16/15 0857    OBJECTIVE: Vitals:   10/07/16 1525  BP: 135/77  Pulse: (!) 59  Resp: 18  Temp: 98 F (36.7 C)     Body mass index is 31.12 kg/m.    ECOG FS:0 - Asymptomatic  General: Well-developed, well-nourished, no acute distress. Eyes: Pink conjunctiva, anicteric sclera. Lungs: Clear to auscultation bilaterally. Heart: Regular rate and rhythm. No rubs, murmurs, or gallops. Abdomen: Soft, nontender.  No organomegaly noted, normoactive bowel sounds. Ventral hernia noted. Musculoskeletal: Bilateral lower extremity edema 1+ pitting, no cyanosis, or clubbing. Neuro: Alert, answering all questions appropriately. Cranial nerves grossly intact. Skin: Mild bruising noted. Psych: Normal affect.   LAB RESULTS:  Lab Results  Component Value Date   NA 141 04/02/2016   K 5.0 05/07/2016   CL 110 04/02/2016   CO2 22 04/02/2016   GLUCOSE 137 (H) 04/02/2016   BUN 53 (H) 05/07/2016   CREATININE 6.10 (H) 05/07/2016   CALCIUM 9.5 04/02/2016   PROT 5.8 (L) 09/18/2015   ALBUMIN 2.9 (L) 09/18/2015   AST 23 09/18/2015   ALT 18 09/18/2015   ALKPHOS 105 09/18/2015   BILITOT 0.4 09/18/2015   GFRNONAA 8 (L) 05/07/2016   GFRAA 9 (L) 05/07/2016    Lab Results  Component Value Date   WBC 6.0 10/07/2016   NEUTROABS  2.4 10/07/2016   HGB 11.6 (L) 10/07/2016   HCT 33.4 (L) 10/07/2016   MCV 102.1 (H) 10/07/2016   PLT 148 (L) 10/07/2016     STUDIES: No results found.  ASSESSMENT:  Bone marrow biopsy confirmed ITP.  PLAN:    1. ITP: Bone marrow biopsy revealed normal megakaryocytes as well as normal trilineage hematopoiesis thus suggesting peripheral destruction of his platelets.  Infectious workup did not reveal an underlying viral etiology. Patient completed 4 weekly cycles of Rituxan on August 23, 2015.  His platelets continue to be essentially within normal limits. No further intervention is needed at this time. Return to clinic in 3 months with repeat laboratory work and then in 6 months for repeat laboratory work and further evaluation.  2. Chronic renal insufficiency: Patient is now on dialysis. 3. DVT: Patient completed 3 months of treatment for his upper extremity DVT secondary to a PICC line. No further intervention is needed. He has been instructed to discontinue Coumadin.  4. Anemia: Mild, monitor.   Patient expressed understanding and was in agreement with this plan. He also understands  that he can call clinic at any time with any questions, concerns, or complaints.   [name], MD   10/10/2016 3:36 PM

## 2016-10-07 ENCOUNTER — Inpatient Hospital Stay: Payer: Medicare HMO | Attending: Oncology

## 2016-10-07 ENCOUNTER — Inpatient Hospital Stay (HOSPITAL_BASED_OUTPATIENT_CLINIC_OR_DEPARTMENT_OTHER): Payer: Medicare HMO | Admitting: Oncology

## 2016-10-07 VITALS — BP 135/77 | HR 59 | Temp 98.0°F | Resp 18 | Wt 181.3 lb

## 2016-10-07 DIAGNOSIS — E785 Hyperlipidemia, unspecified: Secondary | ICD-10-CM

## 2016-10-07 DIAGNOSIS — D693 Immune thrombocytopenic purpura: Secondary | ICD-10-CM | POA: Diagnosis not present

## 2016-10-07 DIAGNOSIS — E039 Hypothyroidism, unspecified: Secondary | ICD-10-CM | POA: Insufficient documentation

## 2016-10-07 DIAGNOSIS — Z86718 Personal history of other venous thrombosis and embolism: Secondary | ICD-10-CM | POA: Insufficient documentation

## 2016-10-07 DIAGNOSIS — Q600 Renal agenesis, unilateral: Secondary | ICD-10-CM

## 2016-10-07 DIAGNOSIS — Z87442 Personal history of urinary calculi: Secondary | ICD-10-CM | POA: Diagnosis not present

## 2016-10-07 DIAGNOSIS — Z87440 Personal history of urinary (tract) infections: Secondary | ICD-10-CM

## 2016-10-07 DIAGNOSIS — I10 Essential (primary) hypertension: Secondary | ICD-10-CM | POA: Diagnosis not present

## 2016-10-07 DIAGNOSIS — Z9049 Acquired absence of other specified parts of digestive tract: Secondary | ICD-10-CM | POA: Insufficient documentation

## 2016-10-07 DIAGNOSIS — Z79899 Other long term (current) drug therapy: Secondary | ICD-10-CM | POA: Diagnosis not present

## 2016-10-07 DIAGNOSIS — D649 Anemia, unspecified: Secondary | ICD-10-CM | POA: Diagnosis not present

## 2016-10-07 DIAGNOSIS — Z794 Long term (current) use of insulin: Secondary | ICD-10-CM | POA: Diagnosis not present

## 2016-10-07 DIAGNOSIS — E1122 Type 2 diabetes mellitus with diabetic chronic kidney disease: Secondary | ICD-10-CM | POA: Diagnosis not present

## 2016-10-07 LAB — CBC WITH DIFFERENTIAL/PLATELET
BASOS PCT: 1 %
Basophils Absolute: 0 10*3/uL (ref 0–0.1)
EOS PCT: 7 %
Eosinophils Absolute: 0.4 10*3/uL (ref 0–0.7)
HCT: 33.4 % — ABNORMAL LOW (ref 40.0–52.0)
HEMOGLOBIN: 11.6 g/dL — AB (ref 13.0–18.0)
Lymphocytes Relative: 44 %
Lymphs Abs: 2.7 10*3/uL (ref 1.0–3.6)
MCH: 35.5 pg — ABNORMAL HIGH (ref 26.0–34.0)
MCHC: 34.7 g/dL (ref 32.0–36.0)
MCV: 102.1 fL — ABNORMAL HIGH (ref 80.0–100.0)
Monocytes Absolute: 0.5 10*3/uL (ref 0.2–1.0)
Monocytes Relative: 8 %
NEUTROS PCT: 40 %
Neutro Abs: 2.4 10*3/uL (ref 1.4–6.5)
PLATELETS: 148 10*3/uL — AB (ref 150–440)
RBC: 3.27 MIL/uL — AB (ref 4.40–5.90)
RDW: 15.3 % — ABNORMAL HIGH (ref 11.5–14.5)
WBC: 6 10*3/uL (ref 3.8–10.6)

## 2016-10-07 NOTE — Progress Notes (Signed)
Patient is here for follow up, no major complaints  

## 2017-01-07 ENCOUNTER — Other Ambulatory Visit: Payer: Medicare HMO

## 2017-01-08 ENCOUNTER — Inpatient Hospital Stay: Payer: Medicare HMO | Attending: Oncology

## 2017-01-08 DIAGNOSIS — D693 Immune thrombocytopenic purpura: Secondary | ICD-10-CM

## 2017-01-08 LAB — CBC WITH DIFFERENTIAL/PLATELET
BASOS PCT: 1 %
Basophils Absolute: 0 10*3/uL (ref 0–0.1)
EOS ABS: 0.4 10*3/uL (ref 0–0.7)
EOS PCT: 8 %
HCT: 34.2 % — ABNORMAL LOW (ref 40.0–52.0)
HEMOGLOBIN: 12 g/dL — AB (ref 13.0–18.0)
Lymphocytes Relative: 39 %
Lymphs Abs: 2.1 10*3/uL (ref 1.0–3.6)
MCH: 36.1 pg — AB (ref 26.0–34.0)
MCHC: 35.1 g/dL (ref 32.0–36.0)
MCV: 103.1 fL — ABNORMAL HIGH (ref 80.0–100.0)
Monocytes Absolute: 0.5 10*3/uL (ref 0.2–1.0)
Monocytes Relative: 10 %
NEUTROS PCT: 42 %
Neutro Abs: 2.4 10*3/uL (ref 1.4–6.5)
PLATELETS: 143 10*3/uL — AB (ref 150–440)
RBC: 3.32 MIL/uL — AB (ref 4.40–5.90)
RDW: 15.7 % — ABNORMAL HIGH (ref 11.5–14.5)
WBC: 5.5 10*3/uL (ref 3.8–10.6)

## 2017-03-24 ENCOUNTER — Other Ambulatory Visit (INDEPENDENT_AMBULATORY_CARE_PROVIDER_SITE_OTHER): Payer: Self-pay | Admitting: Vascular Surgery

## 2017-03-24 DIAGNOSIS — N185 Chronic kidney disease, stage 5: Secondary | ICD-10-CM

## 2017-03-26 ENCOUNTER — Ambulatory Visit (INDEPENDENT_AMBULATORY_CARE_PROVIDER_SITE_OTHER): Payer: Medicare HMO | Admitting: Vascular Surgery

## 2017-03-26 ENCOUNTER — Ambulatory Visit (INDEPENDENT_AMBULATORY_CARE_PROVIDER_SITE_OTHER): Payer: Medicare HMO

## 2017-03-26 ENCOUNTER — Encounter (INDEPENDENT_AMBULATORY_CARE_PROVIDER_SITE_OTHER): Payer: Self-pay | Admitting: Vascular Surgery

## 2017-03-26 VITALS — BP 146/73 | HR 54 | Resp 16 | Ht 67.5 in | Wt 174.6 lb

## 2017-03-26 DIAGNOSIS — N186 End stage renal disease: Secondary | ICD-10-CM

## 2017-03-26 DIAGNOSIS — E785 Hyperlipidemia, unspecified: Secondary | ICD-10-CM

## 2017-03-26 DIAGNOSIS — N185 Chronic kidney disease, stage 5: Secondary | ICD-10-CM

## 2017-03-26 DIAGNOSIS — Z992 Dependence on renal dialysis: Secondary | ICD-10-CM

## 2017-03-26 DIAGNOSIS — E1122 Type 2 diabetes mellitus with diabetic chronic kidney disease: Secondary | ICD-10-CM

## 2017-03-26 NOTE — Progress Notes (Signed)
Subjective:    Patient ID: Alejandro Armstrong, male    DOB: 1929/09/22, 83 y.o.   MRN: [id number] Chief Complaint  Patient presents with  . Follow-up    low transonic flow   Patient presents at the request of Dr. Holley Raring for decreased transonic flows. Patient does report the machine will "turnoff". He denies any pain to the left upper extremity or hand. The patient is able to complete dialysis however the machine does turnoff multiple times. Patient denies any shortness of breath or chest pain. Patient denies any fever, nausea or vomiting. The patient underwent a left upper extremity AV graft duplex exam which was notable for a patent left brachial axillary AV graft and anastomosis. There is a nearly occlusive thrombus in the middle left brachial vein and left axillary vein influencing arterial flow throughout the venous segment of the brachial axillary graft. Patent left brachial artery proximally and distally to the anastomosis.   Review of Systems  Constitutional: Negative.   HENT: Negative.   Eyes: Negative.   Respiratory: Negative.   Cardiovascular: Negative.   Gastrointestinal: Negative.   Endocrine: Negative.   Genitourinary:       Trouble with dialysis  Musculoskeletal: Negative.   Skin: Negative.   Allergic/Immunologic: Negative.   Neurological: Negative.   Hematological: Negative.   Psychiatric/Behavioral: Negative.       Objective:   Physical Exam  Constitutional: He is oriented to person, place, and time. He appears well-developed and well-nourished. No distress.  HENT:  Head: Normocephalic and atraumatic.  Eyes: Conjunctivae are normal.  Neck: Normal range of motion.  Cardiovascular: Normal rate, regular rhythm, normal heart sounds and intact distal pulses.   Pulses:      Radial pulses are 2+ on the right side, and 2+ on the left side.  Left upper extremity graft: Bruit and thrill noted. Skin is intact.  Pulmonary/Chest: Effort normal.  Musculoskeletal: Normal  range of motion. He exhibits no edema.  Neurological: He is alert and oriented to person, place, and time.  Skin: Skin is warm and dry. He is not diaphoretic.  Psychiatric: He has a normal mood and affect. His behavior is normal. Judgment and thought content normal.  Vitals reviewed.  BP (!) 146/73 (BP Location: Right Arm)   Pulse (!) 54   Resp 16   Ht 5' 7.5" (1.715 m)   Wt 174 lb 9.6 oz (79.2 kg)   BMI 26.94 kg/m   Past Medical History:  Diagnosis Date  . Hyperlipidemia   . Hypertension   . Hypothyroidism   . Idiopathic thrombocytopenia purpura ([place])   . Kidney stones   . Recurrent UTI   . Renal agenesis    discovered at age 14  . Type 2 diabetes mellitus ([place])   . Ureteral stricture    Social History   Social History  . Marital status: Married    Spouse name: [name]  . Number of children: [name]  . Years of education: [name]   Occupational History  . retired    Social History Main Topics  . Smoking status: Never Smoker  . Smokeless tobacco: Never Used  . Alcohol use No  . Drug use: No  . Sexual activity: No   Other Topics Concern  . Not on file   Social History Narrative  . No narrative on file   Past Surgical History:  Procedure Laterality Date  . APPENDECTOMY    . AV FISTULA PLACEMENT Left 04/16/2016   Procedure: INSERTION OF ARTERIOVENOUS (AV)  GORE-TEX GRAFT ARM;  Surgeon: [name], MD;  Location: [facility] ORS;  Service: Vascular;  Laterality: Left;  . CATARACT EXTRACTION W/ INTRAOCULAR LENS IMPLANT Bilateral 2014   done 1-2 months apart at [facility].  [name] FINGER SURGERY Left 2002   pinky finger  . KIDNEY STONE SURGERY    . NASAL SINUS SURGERY  1985  . PERIPHERAL VASCULAR CATHETERIZATION  05/13/2016   Procedure: Upper Extremity Angiography;  Surgeon: [name], MD;  Location: [place] [facility];  Service: Cardiovascular;;  . PERIPHERAL VASCULAR CATHETERIZATION  05/13/2016   Procedure: Upper Extremity Intervention;  Surgeon: [name], MD;   Location: [place] [facility];  Service: Cardiovascular;;  . PICC LINE PLACE PERIPHERAL ([place] HX) Right 08/2015  . PICC LINE REMOVAL ([place] HX) Right 09/2015   Family History  Problem Relation Age of Onset  . Cervical cancer Sister    No Known Allergies     Assessment & Plan:  Patient presents at the request of Dr. Holley Raring for decreased transonic flows. Patient does report the machine will "turnoff". He denies any pain to the left upper extremity or hand. The patient is able to complete dialysis however the machine does turnoff multiple times. Patient denies any shortness of breath or chest pain. Patient denies any fever, nausea or vomiting. The patient underwent a left upper extremity AV graft duplex exam which was notable for a patent left brachial axillary AV graft and anastomosis. There is a nearly occlusive thrombus in the middle left brachial vein and left axillary vein influencing arterial flow throughout the venous segment of the brachial axillary graft. Patent left brachial artery proximally and distally to the anastomosis.  1. Type 2 diabetes mellitus with stage 5 chronic kidney disease not on chronic dialysis, without long-term current use of insulin (HCC) - Stable Encouraged good control as its slows the progression of atherosclerotic disease  2. ESRD on dialysis ([place]) - Stable Patient is experiencing low transonic flows at dialysis Patient notes the machine will turnoff multiple times during dialysis Physical exam is unremarkable Duplex today shows area of significant narrowing. Recommend a left upper extremity shuntogram and attempt to assess anatomy and correct any narrowing at the time to restore adequate function to the patient's dialysis access Procedure, risks and benefits explained to the patient All questions answered Patient wishes to proceed  3. Hyperlipidemia, unspecified hyperlipidemia type - Stable Encouraged good control as its slows the progression of  atherosclerotic disease  Current Outpatient Prescriptions on File Prior to Visit  Medication Sig Dispense Refill  . amLODipine (NORVASC) 5 MG tablet Take 1 tablet (5 mg total) by mouth daily. (Patient taking differently: Take 5 mg by mouth every morning. ) 30 tablet 0  . atorvastatin (LIPITOR) 10 MG tablet Take 10 mg by mouth daily.    . calcitRIOL (ROCALTROL) 0.25 MCG capsule Take 0.25 mcg by mouth daily.    . Calcium Carb-Cholecalciferol (CALCIUM + D3 PO) Take 1 tablet by mouth daily.    . Carboxymethylcellulose Sodium (LUBRICANT EYE DROPS OP) Apply 1 drop to eye daily as needed (dry eyes).    . ciprofloxacin (CIPRO) 250 MG tablet Take 250 mg by mouth daily.     . ergocalciferol (VITAMIN D2) 50000 units capsule Take 50,000 Units by mouth once a week.    . ferrous sulfate 325 (65 FE) MG tablet Take 325 mg by mouth daily with breakfast.     . furosemide (LASIX) 40 MG tablet Take 40 mg by mouth every  morning.     . hydrALAZINE (APRESOLINE) 25 MG tablet Take 25 mg by mouth 2 (two) times daily.    [name] levothyroxine (SYNTHROID, LEVOTHROID) 112 MCG tablet Take 112 mcg by mouth daily before breakfast.    . lidocaine-prilocaine (EMLA) cream APPLY TO ACCESS SITE 30 MINUTES PRIOR TO DIALYSIS  11  . lisinopril (PRINIVIL,ZESTRIL) 20 MG tablet Take 20 mg by mouth every morning.     [name] MELATONIN PO Take by mouth.    . Omega-3 1000 MG CAPS Take 4 capsules by mouth daily.     . sodium bicarbonate 650 MG tablet Take 1,300 mg by mouth 3 (three) times daily.     [name] HYDROcodone-acetaminophen (NORCO) 5-325 MG tablet Take 1-2 tablets by mouth every 6 (six) hours as needed for moderate pain. (Patient not taking: Reported on 05/06/2016) 60 tablet 0  . insulin glargine (LANTUS) 100 UNIT/ML injection Inject 0.1 mLs (10 Units total) into the skin at bedtime. (Patient not taking: Reported on 03/26/2017) 10 mL 11   Current Facility-Administered Medications on File Prior to Visit  Medication Dose Route Frequency Provider  Last Rate Last Dose  . heparin lock flush 100 unit/mL  500 Units Intravenous Once [name], MD      . sodium chloride flush (NS) 0.9 % injection 10 mL  10 mL Intravenous PRN [name], MD      . sodium chloride flush (NS) 0.9 % injection 10 mL  10 mL Intravenous PRN [name], MD   10 mL at 08/16/15 0857   There are no Patient Instructions on file for this visit. No Follow-up on file.  Ziyanna Tolin A Akim Watkinson, PA-C

## 2017-03-27 ENCOUNTER — Other Ambulatory Visit (INDEPENDENT_AMBULATORY_CARE_PROVIDER_SITE_OTHER): Payer: Self-pay | Admitting: Vascular Surgery

## 2017-03-30 MED ORDER — CEFAZOLIN SODIUM-DEXTROSE 1-4 GM/50ML-% IV SOLN
1.0000 g | Freq: Once | INTRAVENOUS | Status: AC
  Administered 2017-03-31: 1 g via INTRAVENOUS

## 2017-03-31 ENCOUNTER — Encounter: Payer: Self-pay | Admitting: *Deleted

## 2017-03-31 ENCOUNTER — Encounter
Admission: RE | Disposition: A | Discharge status: Home / Self Care | Payer: Self-pay | Source: Ambulatory Visit | Attending: Vascular Surgery

## 2017-03-31 ENCOUNTER — Ambulatory Visit
Admission: RE | Admit: 2017-03-31 | Discharge: 2017-03-31 | Disposition: A | Discharge status: Home / Self Care | Payer: Medicare HMO | Source: Ambulatory Visit | Attending: Vascular Surgery | Admitting: Vascular Surgery

## 2017-03-31 ENCOUNTER — Encounter (INDEPENDENT_AMBULATORY_CARE_PROVIDER_SITE_OTHER): Payer: Medicare HMO

## 2017-03-31 ENCOUNTER — Ambulatory Visit (INDEPENDENT_AMBULATORY_CARE_PROVIDER_SITE_OTHER): Payer: Medicare HMO | Admitting: Vascular Surgery

## 2017-03-31 DIAGNOSIS — N186 End stage renal disease: Secondary | ICD-10-CM | POA: Diagnosis not present

## 2017-03-31 DIAGNOSIS — Z9842 Cataract extraction status, left eye: Secondary | ICD-10-CM | POA: Diagnosis not present

## 2017-03-31 DIAGNOSIS — I120 Hypertensive chronic kidney disease with stage 5 chronic kidney disease or end stage renal disease: Secondary | ICD-10-CM | POA: Diagnosis not present

## 2017-03-31 DIAGNOSIS — Z992 Dependence on renal dialysis: Secondary | ICD-10-CM | POA: Diagnosis not present

## 2017-03-31 DIAGNOSIS — Z87442 Personal history of urinary calculi: Secondary | ICD-10-CM | POA: Insufficient documentation

## 2017-03-31 DIAGNOSIS — T82858A Stenosis of vascular prosthetic devices, implants and grafts, initial encounter: Secondary | ICD-10-CM | POA: Insufficient documentation

## 2017-03-31 DIAGNOSIS — E1122 Type 2 diabetes mellitus with diabetic chronic kidney disease: Secondary | ICD-10-CM | POA: Diagnosis not present

## 2017-03-31 DIAGNOSIS — Z9841 Cataract extraction status, right eye: Secondary | ICD-10-CM | POA: Insufficient documentation

## 2017-03-31 DIAGNOSIS — Z9889 Other specified postprocedural states: Secondary | ICD-10-CM | POA: Diagnosis not present

## 2017-03-31 DIAGNOSIS — Z794 Long term (current) use of insulin: Secondary | ICD-10-CM | POA: Insufficient documentation

## 2017-03-31 DIAGNOSIS — E079 Disorder of thyroid, unspecified: Secondary | ICD-10-CM | POA: Insufficient documentation

## 2017-03-31 DIAGNOSIS — Z8049 Family history of malignant neoplasm of other genital organs: Secondary | ICD-10-CM | POA: Insufficient documentation

## 2017-03-31 DIAGNOSIS — Z9089 Acquired absence of other organs: Secondary | ICD-10-CM | POA: Insufficient documentation

## 2017-03-31 DIAGNOSIS — E785 Hyperlipidemia, unspecified: Secondary | ICD-10-CM | POA: Insufficient documentation

## 2017-03-31 DIAGNOSIS — D693 Immune thrombocytopenic purpura: Secondary | ICD-10-CM | POA: Diagnosis not present

## 2017-03-31 DIAGNOSIS — Q600 Renal agenesis, unilateral: Secondary | ICD-10-CM | POA: Insufficient documentation

## 2017-03-31 DIAGNOSIS — Y832 Surgical operation with anastomosis, bypass or graft as the cause of abnormal reaction of the patient, or of later complication, without mention of misadventure at the time of the procedure: Secondary | ICD-10-CM | POA: Diagnosis not present

## 2017-03-31 DIAGNOSIS — T82868A Thrombosis of vascular prosthetic devices, implants and grafts, initial encounter: Secondary | ICD-10-CM | POA: Diagnosis not present

## 2017-03-31 DIAGNOSIS — Z87440 Personal history of urinary (tract) infections: Secondary | ICD-10-CM | POA: Insufficient documentation

## 2017-03-31 DIAGNOSIS — N185 Chronic kidney disease, stage 5: Secondary | ICD-10-CM | POA: Diagnosis not present

## 2017-03-31 HISTORY — PX: A/V FISTULAGRAM: CATH118298

## 2017-03-31 LAB — POTASSIUM (ARMC VASCULAR LAB ONLY): Potassium (ARMC vascular lab): 4 (ref 3.5–5.1)

## 2017-03-31 LAB — GLUCOSE, CAPILLARY
Glucose-Capillary: 104 mg/dL — ABNORMAL HIGH (ref 65–99)
Glucose-Capillary: 91 mg/dL (ref 65–99)

## 2017-03-31 SURGERY — A/V FISTULAGRAM
Anesthesia: Moderate Sedation | Site: Arm Upper | Laterality: Left

## 2017-03-31 MED ORDER — MIDAZOLAM HCL 2 MG/2ML IJ SOLN
INTRAMUSCULAR | Status: AC
  Filled 2017-03-31: qty 2

## 2017-03-31 MED ORDER — HEPARIN SODIUM (PORCINE) 1000 UNIT/ML IJ SOLN
INTRAMUSCULAR | Status: AC
  Filled 2017-03-31: qty 1

## 2017-03-31 MED ORDER — CEFAZOLIN SODIUM-DEXTROSE 1-4 GM/50ML-% IV SOLN
INTRAVENOUS | Status: AC
  Filled 2017-03-31: qty 50

## 2017-03-31 MED ORDER — MIDAZOLAM HCL 2 MG/2ML IJ SOLN
INTRAMUSCULAR | Status: DC | PRN
  Administered 2017-03-31: 1 mg via INTRAVENOUS
  Administered 2017-03-31: 2 mg via INTRAVENOUS

## 2017-03-31 MED ORDER — FENTANYL CITRATE (PF) 100 MCG/2ML IJ SOLN
INTRAMUSCULAR | Status: AC
  Filled 2017-03-31: qty 2

## 2017-03-31 MED ORDER — FAMOTIDINE 20 MG PO TABS: 40.0000 mg | ORAL_TABLET | ORAL | Status: DC | PRN

## 2017-03-31 MED ORDER — LIDOCAINE HCL (PF) 1 % IJ SOLN
INTRAMUSCULAR | Status: AC
  Filled 2017-03-31: qty 30

## 2017-03-31 MED ORDER — METHYLPREDNISOLONE SODIUM SUCC 125 MG IJ SOLR: 125.0000 mg | INTRAMUSCULAR | Status: DC | PRN

## 2017-03-31 MED ORDER — HEPARIN (PORCINE) IN NACL 2-0.9 UNIT/ML-% IJ SOLN
INTRAMUSCULAR | Status: AC
  Filled 2017-03-31: qty 1000

## 2017-03-31 MED ORDER — IOPAMIDOL (ISOVUE-300) INJECTION 61%
INTRAVENOUS | Status: DC | PRN
  Administered 2017-03-31: 60 mL via INTRAVENOUS

## 2017-03-31 MED ORDER — HEPARIN SODIUM (PORCINE) 1000 UNIT/ML IJ SOLN
INTRAMUSCULAR | Status: DC | PRN
  Administered 2017-03-31: 3000 [IU] via INTRAVENOUS

## 2017-03-31 MED ORDER — SODIUM CHLORIDE 0.9 % IV SOLN: INTRAVENOUS | Status: DC

## 2017-03-31 MED ORDER — FENTANYL CITRATE (PF) 100 MCG/2ML IJ SOLN
INTRAMUSCULAR | Status: DC | PRN
  Administered 2017-03-31: 25 ug via INTRAVENOUS
  Administered 2017-03-31: 50 ug via INTRAVENOUS

## 2017-03-31 SURGICAL SUPPLY — 23 items
BALLN DORADO 9X40X80 (BALLOONS) ×3
BALLN DORADO7X100X80 (BALLOONS) ×3
BALLN ULTRVRSE 4X80X75 (BALLOONS) ×3
BALLOON DORADO 9X40X80 (BALLOONS) ×1 IMPLANT
BALLOON DORADO7X100X80 (BALLOONS) ×1 IMPLANT
BALLOON ULTRVRSE 4X80X75 (BALLOONS) ×1 IMPLANT
CATH BEACON 5 .035 40 KMP TP (CATHETERS) ×1 IMPLANT
CATH BEACON 5 .038 100 VERT TP (CATHETERS) ×3 IMPLANT
CATH BEACON 5 .038 40 KMP TP (CATHETERS) ×2
DEVICE PRESTO INFLATION (MISCELLANEOUS) ×3 IMPLANT
DEVICE TORQUE (MISCELLANEOUS) ×3 IMPLANT
DRAPE BRACHIAL (DRAPES) ×3 IMPLANT
GLIDECATH 4FR STR (CATHETERS) ×3 IMPLANT
GUIDEWIRE ANGLED .035 180CM (WIRE) ×3 IMPLANT
NEEDLE ENTRY 21GA 7CM ECHOTIP (NEEDLE) ×3 IMPLANT
PACK ANGIOGRAPHY (CUSTOM PROCEDURE TRAY) ×3 IMPLANT
SET INTRO CAPELLA COAXIAL (SET/KITS/TRAYS/PACK) ×3 IMPLANT
SHEATH BRITE TIP 6FRX5.5 (SHEATH) ×3 IMPLANT
SHEATH BRITE TIP 7FRX5.5 (SHEATH) ×3 IMPLANT
STENT COVERA FLARED 9X60X80 (Permanent Stent) ×3 IMPLANT
STENT COVERA STRAIGHT 9X100X80 (Permanent Stent) ×3 IMPLANT
SUT MNCRL AB 4-0 PS2 18 (SUTURE) ×3 IMPLANT
WIRE MAGIC TORQUE 260C (WIRE) ×3 IMPLANT

## 2017-03-31 NOTE — Progress Notes (Signed)
Dr. Delana Meyer at bedside speaking with pt. And family re: procedural results. All verbalize understanding at this time.

## 2017-03-31 NOTE — H&P (Signed)
[facility] History & Physical Update  The patient was interviewed and re-examined.  The patient's previous History and Physical has been reviewed and is unchanged.  There is no change in the plan of care. We plan to proceed with the scheduled procedure.  Hortencia Pilar, MD  03/31/2017, 12:24 PM

## 2017-03-31 NOTE — Op Note (Signed)
OPERATIVE NOTE   PROCEDURE: 1. Contrast injection left brachial axillary AV access 2. Percutaneous transluminal angioplasty and stent placement venous outflow left brachial axillary AV graft. 3. Introduction catheter into aortic arch with left arm arteriography  PRE-OPERATIVE DIAGNOSIS: Complication of dialysis access                                                       End Stage Renal Disease  POST-OPERATIVE DIAGNOSIS: same as above   SURGEON: [name], M.D.  ANESTHESIA: Conscious sedation was administered under my direct supervision by the interventional radiology RN. IV Versed plus fentanyl were utilized. Continuous ECG, pulse oximetry and blood pressure was monitored throughout the entire procedure.  Conscious sedation was for a total of 57 minutes.  ESTIMATED BLOOD LOSS: minimal  FINDING(S): 1. Occlusion of the axillary vein at the venous outflow is noted.  This was crossed with a Glidewire.  Given the very low flow within the graft by duplex ultrasound inflow stenosis of the arterial system was highly suspicious and therefore an extended arteriogram was performed from the AV access.  SPECIMEN(S):  None  CONTRAST: 60 cc  FLUOROSCOPY TIME: 10.7 minutes  INDICATIONS: Alejandro Armstrong is a 83 y.o. male who  presents with malfunctioning left brachial axillary AV access.  The patient is scheduled for angiography with possible intervention of the AV access.  The patient is aware the risks include but are not limited to: bleeding, infection, thrombosis of the cannulated access, and possible anaphylactic reaction to the contrast.  The patient acknowledges if the access can not be salvaged a tunneled catheter will be needed and will be placed during this procedure.  The patient is aware of the risks of the procedure and elects to proceed with the angiogram and intervention.  DESCRIPTION: After full informed written consent was obtained, the patient was brought back to the  Special Procedure suite and placed supine position.  Appropriate cardiopulmonary monitors were placed.  The left arm was prepped and draped in the standard fashion.  Appropriate timeout is called. The left brachial axillary AV graft was cannulated with a micropuncture needle in a retrograde direction.  This was selected given the noninvasive study which demonstrated extremely low flow throughout the entire system suggesting a stricture or stenosis of the arterial inflow.  The cannulation was performed with ultrasound guidance. Ultrasound was placed in a sterile sleeve, the AV access was interrogated and noted to be echolucent and compressible indicating patency. Image was recorded for the permanent record. The puncture is performed under continuous ultrasound visualization.   The microwire was advanced and the needle was exchanged for  a microsheath.  The J-wire was then advanced and a 6 Fr sheath inserted.  Floppy Glidewire and KMP catheter was then utilized to negotiate the KMP catheter into the brachial artery.  Hand injections were completed to image the access from the arterial anastomosis through the entire AV access.  The central venous structures were also imaged by hand injections later in the case.  In several views including steep oblique views the arterial anastomosis appeared to be widely patent this implied the stricture could be located more proximally and therefore the Glidewire was reintroduced and the Glidewire and KMP catheter were negotiated all the way into the aortic arch.  Injection of contrast was then utilized to  demonstrate the left arm arterial anatomy from the origin of the subclavian down to the trifurcation in the forearm.  Review of these images did not show any hemodynamically significant lesion although diffuse atherosclerosis is identified.  Given this finding the remaining portion of the graft at the venous anastomosis was interrogated again.  This time using a moderate oblique  with a steep caudal view.  It was at this image that occlusion of the axillary vein from the venous outflow was identified.  Based on the images,  3000 units of heparin was given and a wire was negotiated through the strictures within the venous portion of the graft.  The ultrasound was returned to the field in a sterile sleeve.  The graft was then reimaged with the ultrasound near the arterial anastomosis in an antegrade direction.  Lidocaine was infiltrated and subsequently a micro needle was inserted under direct ultrasound visualization.  Image was recorded for the permanent record.  Microwire was advanced followed by a micro-sheath J-wire and then a 7 [place] sheath.  Using the Kumpe catheter and the Glidewire I successfully crossed the venous occlusion.  A straight glide catheter was then advanced over the wire and positioned with its tip in the subclavian vein where hand injection contrast was performed to demonstrate intraluminal placement.  Glidewire was exchanged for a Magic torque wire.  On all  An 7 mm x 60 mm Dorado balloon was then used to predilate the occlusion.  Subsequently a flared 9 x 60 mm Kavita stent was deployed and then a 9 x 100 straight Kavita was brought this extended the stent from the AV graft to the very proximal axillary vein.  These 2 stents were overlapped by approximately 1 cm.  They were postdilated with a 9 mm Dorado balloon with inflations from 16 atm to 20 atm.  Follow-up imaging demonstrates complete resolution of the stricture with rapid flow of contrast through the graft, the central venous anatomy is preserved.  A 4-0 Monocryl purse-string suture was sewn around the sheaths.  The sheath was removed and light pressure was applied.  A sterile bandage was applied to the puncture site.    COMPLICATIONS: None  CONDITION: [name], M.D [facility] Office: [phone]  03/31/2017 1:32 PM

## 2017-04-08 ENCOUNTER — Ambulatory Visit: Payer: Medicare HMO | Admitting: Oncology

## 2017-04-08 ENCOUNTER — Other Ambulatory Visit: Payer: Medicare HMO

## 2017-04-08 NOTE — Progress Notes (Signed)
[place]  [phone] Fax:([phone]) [phone]  ID: Alejandro Armstrong OB: 04-12-1930  MR#: [id number]  DSK#:[id number]  Patient Care Team: Casilda Carls, MD as PCP - General (Internal Medicine)  CHIEF COMPLAINT: Bone marrow biopsy confirmed ITP.  INTERVAL HISTORY: Patient returns to clinic today for further evaluation and laboratory work. He continues to feel well and is asymptomatic. He does not complain of weakness or fatigue today. He denies any easy bleeding or bruising. He denies any fevers or recent illnesses. He has no neurologic complaints. He denies any chest pain or shortness of breath. He denies any nausea, vomiting, constipation, or diarrhea. He has no urinary complaints. Patient offers no specific complaints today.   REVIEW OF SYSTEMS:   Review of Systems  Constitutional: Negative for fever, malaise/fatigue and weight loss.  Respiratory: Negative.  Negative for cough and shortness of breath.   Cardiovascular: Negative.  Negative for chest pain and leg swelling.  Gastrointestinal: Negative.  Negative for blood in stool and melena.  Genitourinary: Negative.   Musculoskeletal: Negative.   Skin: Negative.  Negative for rash.  Neurological: Negative.  Negative for sensory change, weakness and headaches.  Endo/Heme/Allergies: Does not bruise/bleed easily.  Psychiatric/Behavioral: Negative.  The patient is not nervous/anxious.     As per HPI. Otherwise, a complete review of systems is negative.  PAST MEDICAL HISTORY: Past Medical History:  Diagnosis Date  . Hyperlipidemia   . Hypertension   . Hypothyroidism   . Idiopathic thrombocytopenia purpura ([place])   . Kidney stones   . Recurrent UTI   . Renal agenesis    discovered at age 48  . Type 2 diabetes mellitus ([place])   . Ureteral stricture     PAST SURGICAL HISTORY: Past Surgical History:  Procedure Laterality Date  . A/V FISTULAGRAM Left 03/31/2017   Procedure: A/V Fistulagram;   Surgeon: [name], MD;  Location: [place] [facility];  Service: Cardiovascular;  Laterality: Left;  . APPENDECTOMY    . AV FISTULA PLACEMENT Left 04/16/2016   Procedure: INSERTION OF ARTERIOVENOUS (AV) GORE-TEX GRAFT ARM;  Surgeon: [name], MD;  Location: [facility] ORS;  Service: Vascular;  Laterality: Left;  . CATARACT EXTRACTION W/ INTRAOCULAR LENS IMPLANT Bilateral 2014   done 1-2 months apart at [facility].  [name] FINGER SURGERY Left 2002   pinky finger  . KIDNEY STONE SURGERY    . NASAL SINUS SURGERY  1985  . PERIPHERAL VASCULAR CATHETERIZATION  05/13/2016   Procedure: Upper Extremity Angiography;  Surgeon: [name], MD;  Location: [place] [facility];  Service: Cardiovascular;;  . PERIPHERAL VASCULAR CATHETERIZATION  05/13/2016   Procedure: Upper Extremity Intervention;  Surgeon: [name], MD;  Location: [place] [facility];  Service: Cardiovascular;;  . PICC LINE PLACE PERIPHERAL ([place] HX) Right 08/2015  . PICC LINE REMOVAL ([place] HX) Right 09/2015    FAMILY HISTORY: No reported history of malignancy or chronic disease.      ADVANCED DIRECTIVES:    HEALTH MAINTENANCE: Social History  Substance Use Topics  . Smoking status: Never Smoker  . Smokeless tobacco: Never Used  . Alcohol use No     Colonoscopy:  PAP:  Bone density:  Lipid panel:  No Known Allergies  Current Outpatient Prescriptions  Medication Sig Dispense Refill  . acetaminophen (TYLENOL) 500 MG tablet Take 1,000 mg by mouth every 6 (six) hours as needed (for pain).    [name] amLODipine (NORVASC) 5 MG tablet Take 1 tablet (5 mg total) by  mouth daily. (Patient taking differently: Take 5 mg by mouth every Tuesday, Thursday, Saturday, and Sunday. Patient holds medication on  Dialysis day (M-W-F)) 30 tablet 0  . atorvastatin (LIPITOR) 10 MG tablet Take 10 mg by mouth daily.    . Carboxymethylcellulose Sodium (THERATEARS) 0.25 % SOLN Place 1-2 drops into both eyes 3 (three) times daily as  needed (for dry eyes).    . furosemide (LASIX) 40 MG tablet Take 40 mg by mouth 2 (two) times daily. In the morning and in the afternoon    . hydrALAZINE (APRESOLINE) 25 MG tablet Take 25 mg by mouth 2 (two) times daily.    . insulin detemir (LEVEMIR) 100 UNIT/ML injection Inject 20 Units into the skin daily before breakfast.     . levothyroxine (SYNTHROID, LEVOTHROID) 112 MCG tablet Take 112 mcg by mouth daily before breakfast.    . lidocaine-prilocaine (EMLA) cream APPLY TO ACCESS SITE 30 MINUTES PRIOR TO DIALYSIS  11  . lisinopril (PRINIVIL,ZESTRIL) 20 MG tablet Take 20 mg by mouth daily.     . Melatonin 5 MG TABS Take 5 mg by mouth at bedtime as needed (for sleep.).    [name] Omega-3 1000 MG CAPS Take 2 capsules by mouth 2 (two) times daily.      No current facility-administered medications for this visit.    Facility-Administered Medications Ordered in Other Visits  Medication Dose Route Frequency Provider Last Rate Last Dose  . heparin lock flush 100 unit/mL  500 Units Intravenous Once [name], MD      . sodium chloride flush (NS) 0.9 % injection 10 mL  10 mL Intravenous PRN [name], MD      . sodium chloride flush (NS) 0.9 % injection 10 mL  10 mL Intravenous PRN [name], MD   10 mL at 08/16/15 0857    OBJECTIVE: Vitals:   04/09/17 1451  BP: 122/69  Pulse: 60  Resp: 20  Temp: 97.8 F (36.6 C)     Body mass index is 26.83 kg/m.    ECOG FS:0 - Asymptomatic  General: Well-developed, well-nourished, no acute distress. Eyes: Pink conjunctiva, anicteric sclera. Lungs: Clear to auscultation bilaterally. Heart: Regular rate and rhythm. No rubs, murmurs, or gallops. Abdomen: Soft, nontender. No organomegaly noted, normoactive bowel sounds. Ventral hernia noted. Musculoskeletal: Bilateral lower extremity edema 1+ pitting, no cyanosis, or clubbing. Neuro: Alert, answering all questions appropriately. Cranial nerves grossly intact. Skin: Mild bruising  noted. Psych: Normal affect.   LAB RESULTS:  Lab Results  Component Value Date   NA 141 04/02/2016   K 5.0 05/07/2016   CL 110 04/02/2016   CO2 22 04/02/2016   GLUCOSE 137 (H) 04/02/2016   BUN 53 (H) 05/07/2016   CREATININE 6.10 (H) 05/07/2016   CALCIUM 9.5 04/02/2016   PROT 5.8 (L) 09/18/2015   ALBUMIN 2.9 (L) 09/18/2015   AST 23 09/18/2015   ALT 18 09/18/2015   ALKPHOS 105 09/18/2015   BILITOT 0.4 09/18/2015   GFRNONAA 8 (L) 05/07/2016   GFRAA 9 (L) 05/07/2016    Lab Results  Component Value Date   WBC 6.6 04/09/2017   NEUTROABS 2.9 04/09/2017   HGB 11.5 (L) 04/09/2017   HCT 33.9 (L) 04/09/2017   MCV 104.6 (H) 04/09/2017   PLT 159 04/09/2017     STUDIES: No results found.  ASSESSMENT:  Bone marrow biopsy confirmed ITP.  PLAN:    1. ITP: Bone marrow biopsy revealed normal megakaryocytes as well as normal trilineage  hematopoiesis thus suggesting peripheral destruction of his platelets.  Infectious workup did not reveal an underlying viral etiology. Patient completed 4 weekly cycles of Rituxan on August 23, 2015.  His platelets are within normal limits. No further intervention is needed at this time. Return to clinic in 3 months with repeat laboratory work and then in 6 months for repeat laboratory work and further evaluation.  2. ESRD: Patient is now on dialysis Mondays, Wednesdays, and Fridays. 3. DVT: Patient completed 3 months of treatment for his upper extremity DVT secondary to a PICC line. No further intervention is needed. He has been instructed to discontinue Coumadin.  4. Anemia: Mild, monitor.   Patient expressed understanding and was in agreement with this plan. He also understands that he can call clinic at any time with any questions, concerns, or complaints.   [name], MD   04/10/2017 11:44 AM

## 2017-04-09 ENCOUNTER — Inpatient Hospital Stay: Payer: Medicare HMO | Attending: Oncology

## 2017-04-09 ENCOUNTER — Inpatient Hospital Stay (HOSPITAL_BASED_OUTPATIENT_CLINIC_OR_DEPARTMENT_OTHER): Payer: Medicare HMO | Admitting: Oncology

## 2017-04-09 VITALS — BP 122/69 | HR 60 | Temp 97.8°F | Resp 20 | Wt 173.9 lb

## 2017-04-09 DIAGNOSIS — D693 Immune thrombocytopenic purpura: Secondary | ICD-10-CM

## 2017-04-09 DIAGNOSIS — E785 Hyperlipidemia, unspecified: Secondary | ICD-10-CM | POA: Diagnosis not present

## 2017-04-09 DIAGNOSIS — Z87442 Personal history of urinary calculi: Secondary | ICD-10-CM

## 2017-04-09 DIAGNOSIS — N186 End stage renal disease: Secondary | ICD-10-CM

## 2017-04-09 DIAGNOSIS — Z79899 Other long term (current) drug therapy: Secondary | ICD-10-CM | POA: Diagnosis not present

## 2017-04-09 DIAGNOSIS — I129 Hypertensive chronic kidney disease with stage 1 through stage 4 chronic kidney disease, or unspecified chronic kidney disease: Secondary | ICD-10-CM | POA: Insufficient documentation

## 2017-04-09 DIAGNOSIS — D649 Anemia, unspecified: Secondary | ICD-10-CM

## 2017-04-09 DIAGNOSIS — Q602 Renal agenesis, unspecified: Secondary | ICD-10-CM | POA: Insufficient documentation

## 2017-04-09 DIAGNOSIS — Z992 Dependence on renal dialysis: Secondary | ICD-10-CM

## 2017-04-09 DIAGNOSIS — I10 Essential (primary) hypertension: Secondary | ICD-10-CM

## 2017-04-09 DIAGNOSIS — E039 Hypothyroidism, unspecified: Secondary | ICD-10-CM

## 2017-04-09 DIAGNOSIS — E119 Type 2 diabetes mellitus without complications: Secondary | ICD-10-CM | POA: Diagnosis not present

## 2017-04-09 DIAGNOSIS — N135 Crossing vessel and stricture of ureter without hydronephrosis: Secondary | ICD-10-CM | POA: Diagnosis not present

## 2017-04-09 DIAGNOSIS — Z86718 Personal history of other venous thrombosis and embolism: Secondary | ICD-10-CM | POA: Diagnosis not present

## 2017-04-09 DIAGNOSIS — Z794 Long term (current) use of insulin: Secondary | ICD-10-CM | POA: Insufficient documentation

## 2017-04-09 LAB — CBC WITH DIFFERENTIAL/PLATELET
Basophils Absolute: 0 K/uL (ref 0–0.1)
Basophils Relative: 1 %
Eosinophils Absolute: 0.5 K/uL (ref 0–0.7)
Eosinophils Relative: 7 %
HCT: 33.9 % — ABNORMAL LOW (ref 40.0–52.0)
Hemoglobin: 11.5 g/dL — ABNORMAL LOW (ref 13.0–18.0)
Lymphocytes Relative: 40 %
Lymphs Abs: 2.7 K/uL (ref 1.0–3.6)
MCH: 35.6 pg — ABNORMAL HIGH (ref 26.0–34.0)
MCHC: 34.1 g/dL (ref 32.0–36.0)
MCV: 104.6 fL — ABNORMAL HIGH (ref 80.0–100.0)
Monocytes Absolute: 0.6 K/uL (ref 0.2–1.0)
Monocytes Relative: 9 %
Neutro Abs: 2.9 K/uL (ref 1.4–6.5)
Neutrophils Relative %: 43 %
Platelets: 159 K/uL (ref 150–440)
RBC: 3.24 MIL/uL — ABNORMAL LOW (ref 4.40–5.90)
RDW: 14.5 % (ref 11.5–14.5)
WBC: 6.6 K/uL (ref 3.8–10.6)

## 2017-04-09 NOTE — Progress Notes (Signed)
Patient denies any concerns today.  

## 2017-04-30 ENCOUNTER — Other Ambulatory Visit (INDEPENDENT_AMBULATORY_CARE_PROVIDER_SITE_OTHER): Payer: Medicare HMO

## 2017-04-30 ENCOUNTER — Other Ambulatory Visit (INDEPENDENT_AMBULATORY_CARE_PROVIDER_SITE_OTHER): Payer: Self-pay | Admitting: Vascular Surgery

## 2017-04-30 ENCOUNTER — Ambulatory Visit (INDEPENDENT_AMBULATORY_CARE_PROVIDER_SITE_OTHER): Payer: Medicare HMO | Admitting: Vascular Surgery

## 2017-04-30 DIAGNOSIS — N186 End stage renal disease: Secondary | ICD-10-CM | POA: Diagnosis not present

## 2017-05-06 ENCOUNTER — Encounter (INDEPENDENT_AMBULATORY_CARE_PROVIDER_SITE_OTHER): Payer: Self-pay

## 2017-07-09 ENCOUNTER — Inpatient Hospital Stay: Payer: Medicare HMO | Attending: Oncology

## 2017-07-09 DIAGNOSIS — D693 Immune thrombocytopenic purpura: Secondary | ICD-10-CM | POA: Diagnosis present

## 2017-07-09 LAB — CBC WITH DIFFERENTIAL/PLATELET
BASOS PCT: 1 %
Basophils Absolute: 0 10*3/uL (ref 0–0.1)
Eosinophils Absolute: 0.3 10*3/uL (ref 0–0.7)
Eosinophils Relative: 6 %
HEMATOCRIT: 33.8 % — AB (ref 40.0–52.0)
HEMOGLOBIN: 11.5 g/dL — AB (ref 13.0–18.0)
LYMPHS PCT: 48 %
Lymphs Abs: 2.3 10*3/uL (ref 1.0–3.6)
MCH: 37.3 pg — ABNORMAL HIGH (ref 26.0–34.0)
MCHC: 34 g/dL (ref 32.0–36.0)
MCV: 109.5 fL — ABNORMAL HIGH (ref 80.0–100.0)
MONOS PCT: 9 %
Monocytes Absolute: 0.4 10*3/uL (ref 0.2–1.0)
NEUTROS ABS: 1.7 10*3/uL (ref 1.4–6.5)
Neutrophils Relative %: 36 %
Platelets: 128 10*3/uL — ABNORMAL LOW (ref 150–440)
RBC: 3.09 MIL/uL — ABNORMAL LOW (ref 4.40–5.90)
RDW: 14.7 % — ABNORMAL HIGH (ref 11.5–14.5)
WBC: 4.8 10*3/uL (ref 3.8–10.6)

## 2017-07-21 ENCOUNTER — Encounter (INDEPENDENT_AMBULATORY_CARE_PROVIDER_SITE_OTHER): Payer: Self-pay

## 2017-07-28 ENCOUNTER — Ambulatory Visit
Admission: RE | Admit: 2017-07-28 | Discharge: 2017-07-28 | Disposition: A | Discharge status: Home / Self Care | Payer: Medicare HMO | Source: Ambulatory Visit | Attending: Vascular Surgery | Admitting: Vascular Surgery

## 2017-07-28 ENCOUNTER — Encounter
Admission: RE | Disposition: A | Discharge status: Home / Self Care | Payer: Self-pay | Source: Ambulatory Visit | Attending: Vascular Surgery

## 2017-07-28 ENCOUNTER — Encounter: Payer: Self-pay | Admitting: Vascular Surgery

## 2017-07-28 DIAGNOSIS — I10 Essential (primary) hypertension: Secondary | ICD-10-CM | POA: Diagnosis not present

## 2017-07-28 DIAGNOSIS — E11649 Type 2 diabetes mellitus with hypoglycemia without coma: Secondary | ICD-10-CM | POA: Diagnosis not present

## 2017-07-28 DIAGNOSIS — E119 Type 2 diabetes mellitus without complications: Secondary | ICD-10-CM | POA: Diagnosis not present

## 2017-07-28 DIAGNOSIS — I2510 Atherosclerotic heart disease of native coronary artery without angina pectoris: Secondary | ICD-10-CM | POA: Diagnosis not present

## 2017-07-28 DIAGNOSIS — I120 Hypertensive chronic kidney disease with stage 5 chronic kidney disease or end stage renal disease: Secondary | ICD-10-CM | POA: Insufficient documentation

## 2017-07-28 DIAGNOSIS — Z87440 Personal history of urinary (tract) infections: Secondary | ICD-10-CM | POA: Diagnosis not present

## 2017-07-28 DIAGNOSIS — N186 End stage renal disease: Secondary | ICD-10-CM | POA: Diagnosis not present

## 2017-07-28 DIAGNOSIS — Z955 Presence of coronary angioplasty implant and graft: Secondary | ICD-10-CM | POA: Insufficient documentation

## 2017-07-28 DIAGNOSIS — Z9889 Other specified postprocedural states: Secondary | ICD-10-CM | POA: Diagnosis not present

## 2017-07-28 DIAGNOSIS — Y832 Surgical operation with anastomosis, bypass or graft as the cause of abnormal reaction of the patient, or of later complication, without mention of misadventure at the time of the procedure: Secondary | ICD-10-CM | POA: Insufficient documentation

## 2017-07-28 DIAGNOSIS — Z992 Dependence on renal dialysis: Secondary | ICD-10-CM | POA: Diagnosis not present

## 2017-07-28 DIAGNOSIS — E039 Hypothyroidism, unspecified: Secondary | ICD-10-CM | POA: Insufficient documentation

## 2017-07-28 DIAGNOSIS — Z9841 Cataract extraction status, right eye: Secondary | ICD-10-CM | POA: Diagnosis not present

## 2017-07-28 DIAGNOSIS — T82858A Stenosis of vascular prosthetic devices, implants and grafts, initial encounter: Secondary | ICD-10-CM | POA: Diagnosis not present

## 2017-07-28 DIAGNOSIS — E785 Hyperlipidemia, unspecified: Secondary | ICD-10-CM | POA: Insufficient documentation

## 2017-07-28 DIAGNOSIS — E1122 Type 2 diabetes mellitus with diabetic chronic kidney disease: Secondary | ICD-10-CM | POA: Diagnosis not present

## 2017-07-28 DIAGNOSIS — Z8049 Family history of malignant neoplasm of other genital organs: Secondary | ICD-10-CM | POA: Insufficient documentation

## 2017-07-28 DIAGNOSIS — Z87442 Personal history of urinary calculi: Secondary | ICD-10-CM | POA: Diagnosis not present

## 2017-07-28 DIAGNOSIS — T82868A Thrombosis of vascular prosthetic devices, implants and grafts, initial encounter: Secondary | ICD-10-CM | POA: Diagnosis not present

## 2017-07-28 DIAGNOSIS — Z9842 Cataract extraction status, left eye: Secondary | ICD-10-CM | POA: Diagnosis not present

## 2017-07-28 HISTORY — PX: A/V SHUNTOGRAM: CATH118297

## 2017-07-28 LAB — POTASSIUM (ARMC VASCULAR LAB ONLY): Potassium (ARMC vascular lab): 4.6 (ref 3.5–5.1)

## 2017-07-28 LAB — GLUCOSE, CAPILLARY: Glucose-Capillary: 120 mg/dL — ABNORMAL HIGH (ref 65–99)

## 2017-07-28 SURGERY — A/V SHUNTOGRAM
Anesthesia: Moderate Sedation | Laterality: Left

## 2017-07-28 MED ORDER — IOPAMIDOL (ISOVUE-300) INJECTION 61%
INTRAVENOUS | Status: DC | PRN
  Administered 2017-07-28: 35 mL via INTRAVENOUS

## 2017-07-28 MED ORDER — MIDAZOLAM HCL 5 MG/5ML IJ SOLN
INTRAMUSCULAR | Status: AC
  Filled 2017-07-28: qty 5

## 2017-07-28 MED ORDER — CEFAZOLIN SODIUM-DEXTROSE 1-4 GM/50ML-% IV SOLN
1.0000 g | Freq: Once | INTRAVENOUS | Status: AC
  Administered 2017-07-28: 1 g via INTRAVENOUS

## 2017-07-28 MED ORDER — FENTANYL CITRATE (PF) 100 MCG/2ML IJ SOLN
INTRAMUSCULAR | Status: AC
  Filled 2017-07-28: qty 2

## 2017-07-28 MED ORDER — HEPARIN (PORCINE) IN NACL 2-0.9 UNIT/ML-% IJ SOLN
INTRAMUSCULAR | Status: AC
  Filled 2017-07-28: qty 1000

## 2017-07-28 MED ORDER — LIDOCAINE-EPINEPHRINE (PF) 1 %-1:200000 IJ SOLN
INTRAMUSCULAR | Status: AC
  Filled 2017-07-28: qty 30

## 2017-07-28 MED ORDER — FENTANYL CITRATE (PF) 100 MCG/2ML IJ SOLN
INTRAMUSCULAR | Status: DC | PRN
  Administered 2017-07-28: 50 ug via INTRAVENOUS

## 2017-07-28 MED ORDER — MIDAZOLAM HCL 2 MG/2ML IJ SOLN
INTRAMUSCULAR | Status: DC | PRN
  Administered 2017-07-28: 2 mg via INTRAVENOUS

## 2017-07-28 MED ORDER — SODIUM CHLORIDE 0.9 % IV SOLN
INTRAVENOUS | Status: DC
Start: 2017-07-28 — End: 2017-07-28
  Administered 2017-07-28: 09:00:00 via INTRAVENOUS

## 2017-07-28 MED ORDER — HEPARIN SODIUM (PORCINE) 1000 UNIT/ML IJ SOLN
INTRAMUSCULAR | Status: AC
  Filled 2017-07-28: qty 1

## 2017-07-28 MED ORDER — HEPARIN SODIUM (PORCINE) 1000 UNIT/ML IJ SOLN
INTRAMUSCULAR | Status: DC | PRN
  Administered 2017-07-28: 3000 [IU] via INTRAVENOUS

## 2017-07-28 SURGICAL SUPPLY — 17 items
BALLN DORADO 6X40X80 (BALLOONS) ×3
BALLN DORADO 8X60X80 (BALLOONS) ×3
BALLN LUTONIX AV 10X40X75 (BALLOONS) ×3
BALLOON DORADO 6X40X80 (BALLOONS) ×1 IMPLANT
BALLOON DORADO 8X60X80 (BALLOONS) ×1 IMPLANT
BALLOON LUTONIX AV 10X40X75 (BALLOONS) ×1 IMPLANT
DEVICE PRESTO INFLATION (MISCELLANEOUS) ×3 IMPLANT
DRAPE BRACHIAL (DRAPES) ×3 IMPLANT
NEEDLE ENTRY 21GA 7CM ECHOTIP (NEEDLE) ×3 IMPLANT
PACK ANGIOGRAPHY (CUSTOM PROCEDURE TRAY) ×3 IMPLANT
SET INTRO CAPELLA COAXIAL (SET/KITS/TRAYS/PACK) ×3 IMPLANT
SHEATH BRITE TIP 6FRX5.5 (SHEATH) ×3 IMPLANT
SHEATH BRITE TIP 7FRX5.5 (SHEATH) ×3 IMPLANT
STENT VIABAHN 8X50X120 (Permanent Stent) ×3 IMPLANT
STENT VIABAHN5X120X8X (Permanent Stent) ×1 IMPLANT
SUT MNCRL AB 4-0 PS2 18 (SUTURE) ×3 IMPLANT
WIRE G 018X200 V18 (WIRE) ×3 IMPLANT

## 2017-07-28 NOTE — Op Note (Signed)
OPERATIVE NOTE   PROCEDURE: 1. Contrast injection left brachial axillary AV access 2. Percutaneous transluminal angioplasty and stent placement with an 8 mm via bond peripheral segment  3. Percutaneous transluminal angioplasty central venous segment to 10 mm with a Lutonix drug-eluting balloon  PRE-OPERATIVE DIAGNOSIS: Complication of dialysis access                                                       End Stage Renal Disease  POST-OPERATIVE DIAGNOSIS: same as above   SURGEON: [name], M.D.  ANESTHESIA: Conscious sedation was administered under my direct supervision by the interventional radiology RN. IV Versed plus fentanyl were utilized. Continuous ECG, pulse oximetry and blood pressure was monitored throughout the entire procedure.  Conscious sedation was for a total of 28.  ESTIMATED BLOOD LOSS: minimal  FINDING(S): Stricture of the AV graft within the peripheral segment as well as a stricture within the central venous portion  SPECIMEN(S):  None  CONTRAST: 35 cc  FLUOROSCOPY TIME: 2.1 minutes  INDICATIONS: Alejandro Armstrong is a 84 y.o. male who  presents with malfunctioning left arm AV access.  The patient is scheduled for angiography with possible intervention of the AV access.  The patient is aware the risks include but are not limited to: bleeding, infection, thrombosis of the cannulated access, and possible anaphylactic reaction to the contrast.  The patient acknowledges if the access can not be salvaged a tunneled catheter will be needed and will be placed during this procedure.  The patient is aware of the risks of the procedure and elects to proceed with the angiogram and intervention.  DESCRIPTION: After full informed written consent was obtained, the patient was brought back to the Special Procedure suite and placed supine position.  Appropriate cardiopulmonary monitors were placed.  The left arm was prepped and draped in the standard fashion.  Appropriate  timeout is called. The left brachial axillary AV graft was cannulated with a micropuncture needle in an antegrade direction near the arterial anastomosis.  Cannulation was performed with ultrasound guidance. Ultrasound was placed in a sterile sleeve, the AV access was interrogated and noted to be echolucent and compressible indicating patency. Image was recorded for the permanent record. The puncture is performed under continuous ultrasound visualization.   The microwire was advanced and the needle was exchanged for  a microsheath.  The J-wire was then advanced and a 6 Fr sheath inserted.  Hand injections were completed to image the access from the arterial anastomosis through the entire access.  The central venous structures were also imaged by hand injections.  Based on the images,  3000 units of heparin was given and a wire was negotiated through the strictures within the venous portion of the graft as well as the central stenosis. The sheath was then upsized to a 7 [place] sheath.  The detector was then repositioned over the peripheral portion of the AV access and 6 mm x 40 mm Dorado balloon was used to treat the stricture within the AV access. Inflation was to 20 atm for 1 minutes.  A 8 mm x 50 mm via bond stent was then deployed across the lesion and an 8 mm Dorado balloon was used.  Inflation was to 24 atm for 1 minute.  Follow-up imaging demonstrated less than 5% residual stenosis.  The  8 mm Dorado balloon was then advanced across the subclavian lesion and inflated to 26 atm for 1 minute.  A 10 mm x 40 mm Lutonix drug-eluting balloon was then advanced across the subclavian stricture inflation was to 14 atm for 2 minutes.  Follow-up imaging demonstrated less than 10% residual stenosis.  Follow-up imaging demonstrates significant improvement with a marked reduction of the strictures to less than 10% at both locations.  There is now rapid flow of contrast through the graft and the central veins.   A 4-0  Monocryl purse-string suture was sewn around the sheath.  The sheath was removed and light pressure was applied.  A sterile bandage was applied to the puncture site.    COMPLICATIONS: None  CONDITION: Alejandro [facility], M.D [facility] Office: [phone]  07/28/2017 9:51 AM

## 2017-07-28 NOTE — H&P (Signed)
[facility] Admission History & Physical  MRN : [id number]  Alejandro Armstrong is a 84 y.o. (06-Mar-1930) male who presents with chief complaint of No chief complaint on file. [name]  History of Present Illness: I am asked to evaluate the patient by the dialysis center. The patient was sent here because they were unable to achieve adequate dialysis this morning.  He is maintained by left brachial axillary graft that was created in January 2017.  Furthermore the Center states there is very poor thrill and bruit. The patient states there there have been increasing problems with the access, such as "pulling clots" during dialysis and prolonged bleeding after decannulation. The patient estimates these problems have been going on for several weeks. The patient is unaware of any other change.  Patient denies pain or tenderness overlying the access.  There is no pain with dialysis.  The patient denies hand pain or finger pain consistent with steal syndrome.   There have been several past interventions but no declots of this access.  The patient is not chronically hypotensive on dialysis.  Current Facility-Administered Medications  Medication Dose Route Frequency Provider Last Rate Last Dose  . 0.9 %  sodium chloride infusion   Intravenous Continuous Stevens Magwood, Dolores Lory, MD 10 mL/hr at 07/28/17 0845    . ceFAZolin (ANCEF) IVPB 1 g/50 mL premix  1 g Intravenous Once [name], Dolores Lory, MD       Facility-Administered Medications Ordered in Other Encounters  Medication Dose Route Frequency Provider Last Rate Last Dose  . heparin lock flush 100 unit/mL  500 Units Intravenous Once [name], MD      . sodium chloride flush (NS) 0.9 % injection 10 mL  10 mL Intravenous PRN [name], MD      . sodium chloride flush (NS) 0.9 % injection 10 mL  10 mL Intravenous PRN [name], MD   10 mL at 08/16/15 0857    Past Medical History:  Diagnosis Date  .  Hyperlipidemia   . Hypertension   . Hypothyroidism   . Idiopathic thrombocytopenia purpura ([place])   . Kidney stones   . Recurrent UTI   . Renal agenesis    discovered at age 63  . Type 2 diabetes mellitus ([place])   . Ureteral stricture     Past Surgical History:  Procedure Laterality Date  . A/V FISTULAGRAM Left 03/31/2017   Procedure: A/V Fistulagram;  Surgeon: [name], MD;  Location: [facility];  Service: Cardiovascular;  Laterality: Left;  . APPENDECTOMY    . AV FISTULA PLACEMENT Left 04/16/2016   Procedure: INSERTION OF ARTERIOVENOUS (AV) GORE-TEX GRAFT ARM;  Surgeon: [name], MD;  Location: [facility];  Service: Vascular;  Laterality: Left;  . CATARACT EXTRACTION W/ INTRAOCULAR LENS IMPLANT Bilateral 2014   done 1-2 months apart at [facility].  [name] FINGER SURGERY Left 2002   pinky finger  . KIDNEY STONE SURGERY    . NASAL SINUS SURGERY  1985  . PERIPHERAL VASCULAR CATHETERIZATION  05/13/2016   Procedure: Upper Extremity Angiography;  Surgeon: [name], MD;  Location: [facility];  Service: Cardiovascular;;  . PERIPHERAL VASCULAR CATHETERIZATION  05/13/2016   Procedure: Upper Extremity Intervention;  Surgeon: [name], MD;  Location: [facility];  Service: Cardiovascular;;  . PICC LINE PLACE PERIPHERAL ([place] HX) Right 08/2015  . PICC LINE REMOVAL ([place] HX) Right 09/2015    Social History Social History  Tobacco Use  . Smoking status: Never Smoker  . Smokeless tobacco: Never Used  Substance Use Topics  . Alcohol use: No    Alcohol/week: 0.0 oz  . Drug use: No    Family History Family History  Problem Relation Age of Onset  . Cervical cancer Sister     No family history of bleeding or clotting disorders, autoimmune disease or porphyria  No Known Allergies   REVIEW OF SYSTEMS (Negative unless checked)  Constitutional: [] Weight loss  [] Fever  [] Chills Cardiac: [] Chest pain   [] Chest pressure    [] Palpitations   [] Shortness of breath when laying flat   [] Shortness of breath at rest   [x] Shortness of breath with exertion. Vascular:  [] Pain in legs with walking   [] Pain in legs at rest   [] Pain in legs when laying flat   [] Claudication   [] Pain in feet when walking  [] Pain in feet at rest  [] Pain in feet when laying flat   [] History of DVT   [] Phlebitis   [] Swelling in legs   [] Varicose veins   [] Non-healing ulcers Pulmonary:   [] Uses home oxygen   [] Productive cough   [] Hemoptysis   [] Wheeze  [] COPD   [] Asthma Neurologic:  [] Dizziness  [] Blackouts   [] Seizures   [] History of stroke   [] History of TIA  [] Aphasia   [] Temporary blindness   [] Dysphagia   [] Weakness or numbness in arms   [] Weakness or numbness in legs Musculoskeletal:  [] Arthritis   [] Joint swelling   [] Joint pain   [] Low back pain Hematologic:  [] Easy bruising  [] Easy bleeding   [] Hypercoagulable state   [] Anemic  [] Hepatitis Gastrointestinal:  [] Blood in stool   [] Vomiting blood  [] Gastroesophageal reflux/heartburn   [] Difficulty swallowing. Genitourinary:  [x] Chronic kidney disease   [] Difficult urination  [] Frequent urination  [] Burning with urination   [] Blood in urine Skin:  [] Rashes   [] Ulcers   [] Wounds Psychological:  [] History of anxiety   []  History of major depression.  Physical Examination  Vitals:   07/28/17 0829  BP: 122/75  Pulse: 66  Resp: 16  Temp: 97.6 F (36.4 C)  SpO2: 98%  Weight: 76.2 kg (168 lb)  Height: 5' 7.5" (1.715 m)   Body mass index is 25.92 kg/m. Gen: WD/WN, NAD Head: [place]/AT, No temporalis wasting. Prominent temp pulse not noted. Ear/Nose/Throat: Hearing grossly intact, nares w/o erythema or drainage, oropharynx w/o Erythema/Exudate,  Eyes: Conjunctiva clear, sclera non-icteric Neck: Trachea midline.  No JVD.  Pulmonary:  Good air movement, respirations not labored, no use of accessory muscles.  Cardiac: RRR, normal S1, S2. Vascular: Left brachial axillary dialysis graft poor thrill  poor bruit Vessel Right Left  Radial Palpable  weakly palpable  Ulnar Not Palpable Not Palpable  Brachial Palpable Palpable  Carotid Palpable, without bruit Palpable, without bruit  Gastrointestinal: soft, non-tender/non-distended. No guarding/reflex.  Musculoskeletal: M/S 5/5 throughout.  Extremities without ischemic changes.  No deformity or atrophy.  Neurologic: Sensation grossly intact in extremities.  Symmetrical.  Speech is fluent. Motor exam as listed above. Psychiatric: Judgment intact, Mood & affect appropriate for pt's clinical situation. Dermatologic: No rashes or ulcers noted.  No cellulitis or open wounds. Lymph : No Cervical, Axillary, or Inguinal lymphadenopathy.   CBC Lab Results  Component Value Date   WBC 4.8 07/09/2017   HGB 11.5 (L) 07/09/2017   HCT 33.8 (L) 07/09/2017   MCV 109.5 (H) 07/09/2017   PLT 128 (L) 07/09/2017    BMET    Component Value Date/Time  NA 141 04/02/2016 1131   NA 137 10/15/2012 2113   K 5.0 05/07/2016 1419   K 4.2 10/15/2012 2113   CL 110 04/02/2016 1131   CL 110 (H) 10/15/2012 2113   CO2 22 04/02/2016 1131   CO2 16 (L) 10/15/2012 2113   GLUCOSE 137 (H) 04/02/2016 1131   GLUCOSE 142 (H) 10/15/2012 2113   BUN 53 (H) 05/07/2016 1419   BUN 51 (H) 10/15/2012 2113   CREATININE 6.10 (H) 05/07/2016 1419   CREATININE 3.24 (H) 10/15/2012 2113   CALCIUM 9.5 04/02/2016 1131   CALCIUM 8.7 10/15/2012 2113   GFRNONAA 8 (L) 05/07/2016 1419   GFRNONAA 17 (L) 10/15/2012 2113   GFRAA 9 (L) 05/07/2016 1419   GFRAA 20 (L) 10/15/2012 2113   CrCl cannot be calculated (Patient's most recent lab result is older than the maximum 21 days allowed.).  COAG Lab Results  Component Value Date   INR 0.94 04/02/2016   INR 1.96 12/17/2015   INR 2.43 12/10/2015    Radiology No results found.  Assessment/Plan 1.  Complication dialysis device with thrombosis AV access:  Patient's left brachial axillary dialysis access is malfunctioning. The patient  will undergo angiography and correction of any problems using interventional techniques with the hope of restoring function to the access.  The risks and benefits were described to the patient.  All questions were answered.  The patient agrees to proceed with angiography and intervention. Potassium will be drawn to ensure that it is an appropriate level prior to performing intervention. 2.  End-stage renal disease requiring hemodialysis:  Patient will continue dialysis therapy without further interruption if a successful intervention is not achieved then a tunneled catheter will be placed. Dialysis has already been arranged. 3.  Hypertension:  Patient will continue medical management; nephrology is following no changes in oral medications. 4. Diabetes mellitus:  Glucose will be monitored and oral medications been held this morning once the patient has undergone the patient's procedure po intake will be reinitiated and again Accu-Cheks will be used to assess the blood glucose level and treat as needed. The patient will be restarted on the patient's usual hypoglycemic regime 5.  Coronary artery disease:  EKG will be monitored. Nitrates will be used if needed. The patient's oral cardiac medications will be continued.    Hortencia Pilar, MD  07/28/2017 8:53 AM

## 2017-08-11 ENCOUNTER — Ambulatory Visit (INDEPENDENT_AMBULATORY_CARE_PROVIDER_SITE_OTHER): Payer: Medicare HMO | Admitting: Vascular Surgery

## 2017-08-11 ENCOUNTER — Ambulatory Visit (INDEPENDENT_AMBULATORY_CARE_PROVIDER_SITE_OTHER): Payer: Medicare HMO

## 2017-08-11 ENCOUNTER — Other Ambulatory Visit (INDEPENDENT_AMBULATORY_CARE_PROVIDER_SITE_OTHER): Payer: Self-pay | Admitting: Vascular Surgery

## 2017-08-11 DIAGNOSIS — T829XXD Unspecified complication of cardiac and vascular prosthetic device, implant and graft, subsequent encounter: Secondary | ICD-10-CM

## 2017-08-11 DIAGNOSIS — N186 End stage renal disease: Secondary | ICD-10-CM

## 2017-08-28 ENCOUNTER — Encounter (INDEPENDENT_AMBULATORY_CARE_PROVIDER_SITE_OTHER): Payer: Self-pay | Admitting: Vascular Surgery

## 2017-09-30 ENCOUNTER — Other Ambulatory Visit
Admission: RE | Admit: 2017-09-30 | Discharge: 2017-09-30 | Disposition: A | Discharge status: Home / Self Care | Payer: Medicare HMO | Source: Ambulatory Visit | Attending: Nephrology | Admitting: Nephrology

## 2017-09-30 DIAGNOSIS — N186 End stage renal disease: Secondary | ICD-10-CM | POA: Insufficient documentation

## 2017-10-01 LAB — HEPATITIS C ANTIBODY: HCV Ab: 2.2 s/co ratio — ABNORMAL HIGH (ref 0.0–0.9)

## 2017-10-07 NOTE — Progress Notes (Signed)
[facility]  [phone] Fax:([phone]) [phone]  ID: Alejandro Armstrong OB: 06/22/1929  MR#: [id number]  XHB#:[id number]  Patient Care Team: Casilda Carls, MD as PCP - General (Internal Medicine)  CHIEF COMPLAINT: Bone marrow biopsy confirmed ITP.  INTERVAL HISTORY: Patient returns to clinic today for further evaluation and laboratory work.  He continues to feel well and remains asymptomatic.  He does not complain of weakness or fatigue today. He denies any easy bleeding or bruising. He denies any fevers or recent illnesses. He has no neurologic complaints. He denies any chest pain or shortness of breath. He denies any nausea, vomiting, constipation, or diarrhea. He has no urinary complaints.  Patient feels at his baseline offers no specific complaints today.  REVIEW OF SYSTEMS:   Review of Systems  Constitutional: Negative.  Negative for fever, malaise/fatigue and weight loss.  Respiratory: Negative.  Negative for cough and shortness of breath.   Cardiovascular: Negative.  Negative for chest pain and leg swelling.  Gastrointestinal: Negative.  Negative for blood in stool and melena.  Genitourinary: Negative.  Negative for dysuria and hematuria.  Musculoskeletal: Negative.  Negative for back pain.  Skin: Negative.  Negative for rash.  Neurological: Negative.  Negative for sensory change, focal weakness and weakness.  Endo/Heme/Allergies: Does not bruise/bleed easily.  Psychiatric/Behavioral: Negative.  The patient is not nervous/anxious.     As per HPI. Otherwise, a complete review of systems is negative.  PAST MEDICAL HISTORY: Past Medical History:  Diagnosis Date  . Hyperlipidemia   . Hypertension   . Hypothyroidism   . Idiopathic thrombocytopenia purpura ([place])   . Kidney stones   . Recurrent UTI   . Renal agenesis    discovered at age 89  . Type 2 diabetes mellitus ([place])   . Ureteral stricture     PAST SURGICAL HISTORY: Past Surgical  History:  Procedure Laterality Date  . A/V FISTULAGRAM Left 03/31/2017   Procedure: A/V Fistulagram;  Surgeon: [name], MD;  Location: [facility];  Service: Cardiovascular;  Laterality: Left;  . A/V SHUNTOGRAM Left 07/28/2017   Procedure: A/V SHUNTOGRAM;  Surgeon: [name], MD;  Location: [facility];  Service: Cardiovascular;  Laterality: Left;  . APPENDECTOMY    . AV FISTULA PLACEMENT Left 04/16/2016   Procedure: INSERTION OF ARTERIOVENOUS (AV) GORE-TEX GRAFT ARM;  Surgeon: [name], MD;  Location: [facility];  Service: Vascular;  Laterality: Left;  . CATARACT EXTRACTION W/ INTRAOCULAR LENS IMPLANT Bilateral 2014   done 1-2 months apart [facility].  [name] FINGER SURGERY Left 2002   pinky finger  . KIDNEY STONE SURGERY    . NASAL SINUS SURGERY  1985  . PERIPHERAL VASCULAR CATHETERIZATION  05/13/2016   Procedure: Upper Extremity Angiography;  Surgeon: [name], MD;  Location: [place] [facility];  Service: Cardiovascular;;  . PERIPHERAL VASCULAR CATHETERIZATION  05/13/2016   Procedure: Upper Extremity Intervention;  Surgeon: [name], MD;  Location: [place] [facility];  Service: Cardiovascular;;  . PICC LINE PLACE PERIPHERAL ([place] HX) Right 08/2015  . PICC LINE REMOVAL ([place] HX) Right 09/2015    FAMILY HISTORY: No reported history of malignancy or chronic disease.      ADVANCED DIRECTIVES:    HEALTH MAINTENANCE: Social History   Tobacco Use  . Smoking status: Never Smoker  . Smokeless tobacco: Never Used  Substance Use Topics  . Alcohol use: No    Alcohol/week: 0.0 oz  . Drug use: No  Colonoscopy:  PAP:  Bone density:  Lipid panel:  No Known Allergies  Current Outpatient Medications  Medication Sig Dispense Refill  . atorvastatin (LIPITOR) 10 MG tablet Take 10 mg by mouth daily.    . Carboxymethylcellulose Sodium (THERATEARS) 0.25 % SOLN Place 1-2 drops into both eyes 3 (three) times daily as needed (for  dry eyes).    . furosemide (LASIX) 40 MG tablet Take 40 mg by mouth 2 (two) times daily. In the morning and in the afternoon    . hydrALAZINE (APRESOLINE) 25 MG tablet Take 25 mg by mouth 2 (two) times daily.    . insulin detemir (LEVEMIR) 100 UNIT/ML injection Inject 20 Units into the skin daily before breakfast.     . levothyroxine (SYNTHROID, LEVOTHROID) 112 MCG tablet Take 112 mcg by mouth daily before breakfast.    . lisinopril (PRINIVIL,ZESTRIL) 20 MG tablet Take 20 mg by mouth daily.     . Omega-3 1000 MG CAPS Take 2,000 mg by mouth 2 (two) times daily.     [name] ACCU-CHEK AVIVA PLUS test strip     . acetaminophen (TYLENOL) 500 MG tablet Take 1,000 mg by mouth every 6 (six) hours as needed for moderate pain or headache.     . BD PEN NEEDLE NANO U/F 32G X 4 MM MISC     . lidocaine-prilocaine (EMLA) cream APPLY TO ACCESS SITE 30 MINUTES PRIOR TO DIALYSIS  11   No current facility-administered medications for this visit.    Facility-Administered Medications Ordered in Other Visits  Medication Dose Route Frequency Provider Last Rate Last Dose  . heparin lock flush 100 unit/mL  500 Units Intravenous Once [name], MD      . sodium chloride flush (NS) 0.9 % injection 10 mL  10 mL Intravenous PRN [name], MD      . sodium chloride flush (NS) 0.9 % injection 10 mL  10 mL Intravenous PRN [name], MD   10 mL at 08/16/15 0857    OBJECTIVE: Vitals:   10/08/17 1446  BP: (!) 142/71  Pulse: (!) 54  Resp: 18  Temp: (!) 97.5 F (36.4 C)     Body mass index is 27.51 kg/m.    ECOG FS:0 - Asymptomatic  General: Well-developed, well-nourished, no acute distress. Eyes: Pink conjunctiva, anicteric sclera. HEENT: Normocephalic, moist mucous membranes, clear oropharnyx. Lungs: Clear to auscultation bilaterally. Heart: Regular rate and rhythm. No rubs, murmurs, or gallops. Abdomen: Soft, nontender, nondistended. No organomegaly noted, normoactive bowel  sounds. Musculoskeletal: No edema, cyanosis, or clubbing. Neuro: Alert, answering all questions appropriately. Cranial nerves grossly intact. Skin: No rashes or petechiae noted. Psych: Normal affect. Lymphatics: No cervical, calvicular, axillary or inguinal LAD.   LAB RESULTS:  Lab Results  Component Value Date   NA 141 04/02/2016   K 5.0 05/07/2016   CL 110 04/02/2016   CO2 22 04/02/2016   GLUCOSE 137 (H) 04/02/2016   BUN 53 (H) 05/07/2016   CREATININE 6.10 (H) 05/07/2016   CALCIUM 9.5 04/02/2016   PROT 5.8 (L) 09/18/2015   ALBUMIN 2.9 (L) 09/18/2015   AST 23 09/18/2015   ALT 18 09/18/2015   ALKPHOS 105 09/18/2015   BILITOT 0.4 09/18/2015   GFRNONAA 8 (L) 05/07/2016   GFRAA 9 (L) 05/07/2016    Lab Results  Component Value Date   WBC 5.4 10/08/2017   NEUTROABS 2.2 10/08/2017   HGB 10.4 (L) 10/08/2017   HCT 29.7 (L) 10/08/2017   MCV 110.6 (H)  10/08/2017   PLT 118 (L) 10/08/2017     STUDIES: No results found.  ASSESSMENT:  Bone marrow biopsy confirmed ITP.  PLAN:    1. ITP: Bone marrow biopsy on August 02, 2015 revealed normal megakaryocytes as well as normal trilineage hematopoiesis thus suggesting peripheral destruction of his platelets.  Infectious workup did not reveal an underlying viral etiology. Patient completed 4 weekly cycles of Rituxan on August 23, 2015.  His platelet count has trended down slightly over the past 6 months and now is 118.  If he has a continued decline, he may need retreatment with Rituxan in the near future.  No intervention is needed at this time.  Return to clinic in 3 months for repeat laboratory work and in 6 months for repeat laboratory work and further evaluation.    2. ESRD: Continue dialysis Mondays, Wednesdays, and Fridays. 3.  Anemia: Mild.  Patient's hemoglobin is greater than 10.0, therefore he does not require Procrit at this time.  Approximate 20 minutes was spent in discussion of which greater than 50% was  consultation.   Patient expressed understanding and was in agreement with this plan. He also understands that he can call clinic at any time with any questions, concerns, or complaints.   [name], MD   10/10/2017 11:50 AM

## 2017-10-08 ENCOUNTER — Other Ambulatory Visit: Payer: Self-pay

## 2017-10-08 ENCOUNTER — Inpatient Hospital Stay (HOSPITAL_BASED_OUTPATIENT_CLINIC_OR_DEPARTMENT_OTHER): Payer: Medicare HMO | Admitting: Oncology

## 2017-10-08 ENCOUNTER — Inpatient Hospital Stay: Payer: Medicare HMO | Attending: Oncology

## 2017-10-08 VITALS — BP 142/71 | HR 54 | Temp 97.5°F | Resp 18 | Wt 178.3 lb

## 2017-10-08 DIAGNOSIS — E1122 Type 2 diabetes mellitus with diabetic chronic kidney disease: Secondary | ICD-10-CM

## 2017-10-08 DIAGNOSIS — Z794 Long term (current) use of insulin: Secondary | ICD-10-CM | POA: Insufficient documentation

## 2017-10-08 DIAGNOSIS — N186 End stage renal disease: Secondary | ICD-10-CM

## 2017-10-08 DIAGNOSIS — D693 Immune thrombocytopenic purpura: Secondary | ICD-10-CM | POA: Diagnosis not present

## 2017-10-08 DIAGNOSIS — D649 Anemia, unspecified: Secondary | ICD-10-CM

## 2017-10-08 DIAGNOSIS — I120 Hypertensive chronic kidney disease with stage 5 chronic kidney disease or end stage renal disease: Secondary | ICD-10-CM | POA: Diagnosis not present

## 2017-10-08 DIAGNOSIS — Z79899 Other long term (current) drug therapy: Secondary | ICD-10-CM

## 2017-10-08 DIAGNOSIS — Z992 Dependence on renal dialysis: Secondary | ICD-10-CM | POA: Insufficient documentation

## 2017-10-08 LAB — CBC WITH DIFFERENTIAL/PLATELET
BASOS ABS: 0 10*3/uL (ref 0–0.1)
BASOS PCT: 1 %
EOS PCT: 8 %
Eosinophils Absolute: 0.4 10*3/uL (ref 0–0.7)
HCT: 29.7 % — ABNORMAL LOW (ref 40.0–52.0)
Hemoglobin: 10.4 g/dL — ABNORMAL LOW (ref 13.0–18.0)
Lymphocytes Relative: 40 %
Lymphs Abs: 2.2 10*3/uL (ref 1.0–3.6)
MCH: 38.8 pg — ABNORMAL HIGH (ref 26.0–34.0)
MCHC: 35.1 g/dL (ref 32.0–36.0)
MCV: 110.6 fL — ABNORMAL HIGH (ref 80.0–100.0)
MONO ABS: 0.5 10*3/uL (ref 0.2–1.0)
Monocytes Relative: 10 %
NEUTROS ABS: 2.2 10*3/uL (ref 1.4–6.5)
Neutrophils Relative %: 41 %
PLATELETS: 118 10*3/uL — AB (ref 150–440)
RBC: 2.69 MIL/uL — AB (ref 4.40–5.90)
RDW: 14.9 % — AB (ref 11.5–14.5)
WBC: 5.4 10*3/uL (ref 3.8–10.6)

## 2017-10-08 NOTE — Progress Notes (Signed)
Pt here for follow up. Stated doing well "except when I have these treatments "

## 2017-10-29 ENCOUNTER — Ambulatory Visit (INDEPENDENT_AMBULATORY_CARE_PROVIDER_SITE_OTHER): Payer: Medicare HMO | Admitting: Vascular Surgery

## 2017-11-12 ENCOUNTER — Ambulatory Visit (INDEPENDENT_AMBULATORY_CARE_PROVIDER_SITE_OTHER): Payer: Medicare HMO | Admitting: Vascular Surgery

## 2017-11-12 ENCOUNTER — Encounter (INDEPENDENT_AMBULATORY_CARE_PROVIDER_SITE_OTHER): Payer: Self-pay | Admitting: Vascular Surgery

## 2017-11-12 VITALS — BP 122/63 | HR 65 | Resp 16 | Ht 67.0 in | Wt 174.0 lb

## 2017-11-12 DIAGNOSIS — T829XXS Unspecified complication of cardiac and vascular prosthetic device, implant and graft, sequela: Secondary | ICD-10-CM

## 2017-11-12 DIAGNOSIS — E785 Hyperlipidemia, unspecified: Secondary | ICD-10-CM

## 2017-11-12 DIAGNOSIS — N186 End stage renal disease: Secondary | ICD-10-CM | POA: Diagnosis not present

## 2017-11-12 DIAGNOSIS — E1122 Type 2 diabetes mellitus with diabetic chronic kidney disease: Secondary | ICD-10-CM | POA: Diagnosis not present

## 2017-11-12 DIAGNOSIS — N185 Chronic kidney disease, stage 5: Secondary | ICD-10-CM | POA: Diagnosis not present

## 2017-11-12 DIAGNOSIS — Z992 Dependence on renal dialysis: Secondary | ICD-10-CM

## 2017-11-12 NOTE — Progress Notes (Signed)
MRN : [id number]  Alejandro Armstrong is a 84 y.o. (1929-11-22) male who presents with chief complaint of  Chief Complaint  Patient presents with  . Follow-up  .  History of Present Illness:   The patient returns to the office for followup status post intervention of the dialysis access:   Procedure performed on 07/28/2017: 1. Contrast injection left brachial axillary AV access 2. Percutaneous transluminal angioplasty and stent placement with an 8 mm via bond peripheral segment  3.   Percutaneous transluminal angioplasty central venous segment to 10 mm with a [employer] drug-eluting balloon  Following the intervention the access function has significantly improved, with better flow rates and improved KT/V. The patient has not been experiencing increased bleeding times following decannulation and the patient denies increased recirculation. The patient denies an increase in arm swelling. At the present time the patient denies hand pain.  The patient denies amaurosis fugax or recent TIA symptoms. There are no recent neurological changes noted. The patient denies claudication symptoms or rest pain symptoms. The patient denies history of DVT, PE or superficial thrombophlebitis. The patient denies recent episodes of angina or shortness of breath.       Current Meds  Medication Sig  . ACCU-CHEK AVIVA PLUS test strip   . acetaminophen (TYLENOL) 500 MG tablet Take 1,000 mg by mouth every 6 (six) hours as needed for moderate pain or headache.   [name] atorvastatin (LIPITOR) 10 MG tablet Take 10 mg by mouth daily.  . BD PEN NEEDLE NANO U/F 32G X 4 MM MISC   . Carboxymethylcellulose Sodium (THERATEARS) 0.25 % SOLN Place 1-2 drops into both eyes 3 (three) times daily as needed (for dry eyes).  . furosemide (LASIX) 40 MG tablet Take 40 mg by mouth 2 (two) times daily. In the morning and in the afternoon  . hydrALAZINE (APRESOLINE) 25 MG tablet Take 25 mg by mouth 2 (two) times daily.  . insulin  detemir (LEVEMIR) 100 UNIT/ML injection Inject 20 Units into the skin daily before breakfast.   . levothyroxine (SYNTHROID, LEVOTHROID) 112 MCG tablet Take 112 mcg by mouth daily before breakfast.  . lidocaine-prilocaine (EMLA) cream APPLY TO ACCESS SITE 30 MINUTES PRIOR TO DIALYSIS  . lisinopril (PRINIVIL,ZESTRIL) 20 MG tablet Take 20 mg by mouth daily.   . Omega-3 1000 MG CAPS Take 2,000 mg by mouth 2 (two) times daily.     Past Medical History:  Diagnosis Date  . Hyperlipidemia   . Hypertension   . Hypothyroidism   . Idiopathic thrombocytopenia purpura ([place])   . Kidney stones   . Recurrent UTI   . Renal agenesis    discovered at age 42  . Type 2 diabetes mellitus ([place])   . Ureteral stricture     Past Surgical History:  Procedure Laterality Date  . A/V FISTULAGRAM Left 03/31/2017   Procedure: A/V Fistulagram;  Surgeon: [name], MD;  Location: [place] [facility];  Service: Cardiovascular;  Laterality: Left;  . A/V SHUNTOGRAM Left 07/28/2017   Procedure: A/V SHUNTOGRAM;  Surgeon: [name], MD;  Location: [place] [facility];  Service: Cardiovascular;  Laterality: Left;  . APPENDECTOMY    . AV FISTULA PLACEMENT Left 04/16/2016   Procedure: INSERTION OF ARTERIOVENOUS (AV) GORE-TEX GRAFT ARM;  Surgeon: [name], MD;  Location: [facility];  Service: Vascular;  Laterality: Left;  . CATARACT EXTRACTION W/ INTRAOCULAR LENS IMPLANT Bilateral 2014   done 1-2 months apart at [facility].  [name] FINGER SURGERY  Left 2002   pinky finger  . KIDNEY STONE SURGERY    . NASAL SINUS SURGERY  1985  . PERIPHERAL VASCULAR CATHETERIZATION  05/13/2016   Procedure: Upper Extremity Angiography;  Surgeon: [name], MD;  Location: [place] [facility];  Service: Cardiovascular;;  . PERIPHERAL VASCULAR CATHETERIZATION  05/13/2016   Procedure: Upper Extremity Intervention;  Surgeon: [name], MD;  Location: [place] [facility];  Service: Cardiovascular;;  . PICC LINE  PLACE PERIPHERAL ([place] HX) Right 08/2015  . PICC LINE REMOVAL ([place] HX) Right 09/2015    Social History Social History   Tobacco Use  . Smoking status: Never Smoker  . Smokeless tobacco: Never Used  Substance Use Topics  . Alcohol use: No    Alcohol/week: 0.0 oz  . Drug use: No    Family History Family History  Problem Relation Age of Onset  . Cervical cancer Sister     No Known Allergies   REVIEW OF SYSTEMS (Negative unless checked)  Constitutional: [] Weight loss  [] Fever  [] Chills Cardiac: [] Chest pain   [] Chest pressure   [] Palpitations   [] Shortness of breath when laying flat   [] Shortness of breath with exertion. Vascular:  [] Pain in legs with walking   [] Pain in legs at rest  [] History of DVT   [] Phlebitis   [] Swelling in legs   [] Varicose veins   [] Non-healing ulcers Pulmonary:   [] Uses home oxygen   [] Productive cough   [] Hemoptysis   [] Wheeze  [] COPD   [] Asthma Neurologic:  [] Dizziness   [] Seizures   [] History of stroke   [] History of TIA  [] Aphasia   [] Vissual changes   [] Weakness or numbness in arm   [] Weakness or numbness in leg Musculoskeletal:   [] Joint swelling   [] Joint pain   [] Low back pain Hematologic:  [] Easy bruising  [] Easy bleeding   [] Hypercoagulable state   [] Anemic Gastrointestinal:  [] Diarrhea   [] Vomiting  [] Gastroesophageal reflux/heartburn   [] Difficulty swallowing. Genitourinary:  [x] Chronic kidney disease   [] Difficult urination  [] Frequent urination   [] Blood in urine Skin:  [] Rashes   [] Ulcers  Psychological:  [] History of anxiety   []  History of major depression.  Physical Examination  Vitals:   11/12/17 1021  BP: 122/63  Pulse: 65  Resp: 16  Weight: 174 lb (78.9 kg)  Height: 5\' 7"  (1.702 m)   Body mass index is 27.25 kg/m. Gen: WD/WN, NAD Head: [place]/AT, No temporalis wasting.  Ear/Nose/Throat: Hearing grossly intact, nares w/o erythema or drainage Eyes: PER, EOMI, sclera nonicteric.  Neck: Supple, no large masses.   Pulmonary:   Good air movement, no audible wheezing bilaterally, no use of accessory muscles.  Cardiac: RRR, no JVD Vascular: left brachial axillary graft good thrill good bruit Vessel Right Left  Radial Palpable Palpable  Ulnar Palpable Palpable  Brachial Palpable Palpable  Gastrointestinal: Non-distended. No guarding/no peritoneal signs.  Musculoskeletal: M/S 5/5 throughout.  No deformity or atrophy.  Neurologic: CN 2-12 intact. Symmetrical.  Speech is fluent. Motor exam as listed above. Psychiatric: Judgment intact, Mood & affect appropriate for pt's clinical situation. Dermatologic: No rashes or ulcers noted.  No changes consistent with cellulitis. Lymph : No lichenification or skin changes of chronic lymphedema.  CBC Lab Results  Component Value Date   WBC 5.4 10/08/2017   HGB 10.4 (L) 10/08/2017   HCT 29.7 (L) 10/08/2017   MCV 110.6 (H) 10/08/2017   PLT 118 (L) 10/08/2017    BMET    Component Value Date/Time  NA 141 04/02/2016 1131   NA 137 10/15/2012 2113   K 5.0 05/07/2016 1419   K 4.2 10/15/2012 2113   CL 110 04/02/2016 1131   CL 110 (H) 10/15/2012 2113   CO2 22 04/02/2016 1131   CO2 16 (L) 10/15/2012 2113   GLUCOSE 137 (H) 04/02/2016 1131   GLUCOSE 142 (H) 10/15/2012 2113   BUN 53 (H) 05/07/2016 1419   BUN 51 (H) 10/15/2012 2113   CREATININE 6.10 (H) 05/07/2016 1419   CREATININE 3.24 (H) 10/15/2012 2113   CALCIUM 9.5 04/02/2016 1131   CALCIUM 8.7 10/15/2012 2113   GFRNONAA 8 (L) 05/07/2016 1419   GFRNONAA 17 (L) 10/15/2012 2113   GFRAA 9 (L) 05/07/2016 1419   GFRAA 20 (L) 10/15/2012 2113   CrCl cannot be calculated (Patient's most recent lab result is older than the maximum 21 days allowed.).  COAG Lab Results  Component Value Date   INR 0.94 04/02/2016   INR 1.96 12/17/2015   INR 2.43 12/10/2015    Radiology No results found.    Assessment/Plan 1. ESRD on dialysis [facility]) Continue dialysis without interruption   2. Complication of vascular access for  dialysis, sequela Recommend:  The patient is doing well and currently has adequate dialysis access. The patient's dialysis center is not reporting any access issues. Flow pattern is stable when compared to the prior ultrasound.  The patient should have a duplex ultrasound of the dialysis access in 6 months. The patient will follow-up with me in the office after each ultrasound    - VAS [place] (AVF, AVG); Future  3. Type 2 diabetes mellitus with stage 5 chronic kidney disease not on chronic dialysis, without long-term current use of insulin (HCC) Continue hypoglycemic medications as already ordered, these medications have been reviewed and there are no changes at this time.  Hgb A1C to be monitored as already arranged by primary service   4. Hyperlipidemia, unspecified hyperlipidemia type Continue statin as ordered and reviewed, no changes at this time    Hortencia Pilar, MD  11/12/2017 10:26 AM

## 2017-11-20 ENCOUNTER — Encounter (INDEPENDENT_AMBULATORY_CARE_PROVIDER_SITE_OTHER): Payer: Self-pay | Admitting: Vascular Surgery

## 2017-11-20 DIAGNOSIS — T829XXA Unspecified complication of cardiac and vascular prosthetic device, implant and graft, initial encounter: Secondary | ICD-10-CM | POA: Insufficient documentation

## 2018-01-07 ENCOUNTER — Inpatient Hospital Stay: Payer: Medicare HMO | Attending: Oncology

## 2018-01-07 DIAGNOSIS — D693 Immune thrombocytopenic purpura: Secondary | ICD-10-CM | POA: Insufficient documentation

## 2018-01-07 LAB — CBC WITH DIFFERENTIAL/PLATELET
BASOS ABS: 0 10*3/uL (ref 0–0.1)
Basophils Relative: 1 %
Eosinophils Absolute: 0.6 10*3/uL (ref 0–0.7)
Eosinophils Relative: 9 %
HEMATOCRIT: 32 % — AB (ref 40.0–52.0)
Hemoglobin: 11.1 g/dL — ABNORMAL LOW (ref 13.0–18.0)
LYMPHS ABS: 3 10*3/uL (ref 1.0–3.6)
LYMPHS PCT: 48 %
MCH: 39.2 pg — ABNORMAL HIGH (ref 26.0–34.0)
MCHC: 34.6 g/dL (ref 32.0–36.0)
MCV: 113.3 fL — AB (ref 80.0–100.0)
MONO ABS: 0.6 10*3/uL (ref 0.2–1.0)
Monocytes Relative: 9 %
Neutro Abs: 2 10*3/uL (ref 1.4–6.5)
Neutrophils Relative %: 33 %
Platelets: 116 10*3/uL — ABNORMAL LOW (ref 150–440)
RBC: 2.82 MIL/uL — ABNORMAL LOW (ref 4.40–5.90)
RDW: 14.5 % (ref 11.5–14.5)
WBC: 6.2 10*3/uL (ref 3.8–10.6)

## 2018-02-05 ENCOUNTER — Other Ambulatory Visit
Admission: RE | Admit: 2018-02-05 | Discharge: 2018-02-05 | Disposition: A | Discharge status: Home / Self Care | Payer: Medicare HMO | Source: Ambulatory Visit | Attending: Nephrology | Admitting: Nephrology

## 2018-02-05 DIAGNOSIS — N186 End stage renal disease: Secondary | ICD-10-CM | POA: Diagnosis present

## 2018-02-05 LAB — POTASSIUM: Potassium: 4.7 mmol/L (ref 3.5–5.1)

## 2018-02-05 LAB — CHLORIDE: Chloride: 117 mmol/L — ABNORMAL HIGH (ref 98–111)

## 2018-03-18 ENCOUNTER — Other Ambulatory Visit (INDEPENDENT_AMBULATORY_CARE_PROVIDER_SITE_OTHER): Payer: Medicare HMO

## 2018-03-18 ENCOUNTER — Encounter (INDEPENDENT_AMBULATORY_CARE_PROVIDER_SITE_OTHER): Payer: Self-pay | Admitting: Vascular Surgery

## 2018-03-18 ENCOUNTER — Ambulatory Visit (INDEPENDENT_AMBULATORY_CARE_PROVIDER_SITE_OTHER): Payer: Medicare HMO | Admitting: Vascular Surgery

## 2018-03-18 ENCOUNTER — Other Ambulatory Visit (INDEPENDENT_AMBULATORY_CARE_PROVIDER_SITE_OTHER): Payer: Self-pay | Admitting: Vascular Surgery

## 2018-03-18 VITALS — BP 129/70 | HR 56 | Resp 18 | Ht 64.0 in | Wt 173.0 lb

## 2018-03-18 DIAGNOSIS — Z992 Dependence on renal dialysis: Secondary | ICD-10-CM

## 2018-03-18 DIAGNOSIS — T829XXD Unspecified complication of cardiac and vascular prosthetic device, implant and graft, subsequent encounter: Secondary | ICD-10-CM

## 2018-03-18 DIAGNOSIS — T829XXS Unspecified complication of cardiac and vascular prosthetic device, implant and graft, sequela: Secondary | ICD-10-CM

## 2018-03-18 DIAGNOSIS — T82858A Stenosis of vascular prosthetic devices, implants and grafts, initial encounter: Secondary | ICD-10-CM | POA: Diagnosis not present

## 2018-03-18 DIAGNOSIS — N186 End stage renal disease: Secondary | ICD-10-CM | POA: Diagnosis not present

## 2018-03-18 NOTE — Progress Notes (Signed)
Subjective:    Patient ID: Alejandro Armstrong, male    DOB: 1929/10/13, 84 y.o.   MRN: [id number] Chief Complaint  Patient presents with  . Follow-up    Graft Ultrasound   Patient presents at the request of his dialysis center due to progressively decreasing transonic flow rates.  The patient was last seen on Nov 12, 2017 status post a recent shuntogram on 07/28/2017.  The patient underwent a left upper extremity HDA which was notable for a patent left brachial axillary AV graft with a hemodynamically significant stenosis at the mid upper arm level graft which may be due to internal vessel narrowing versus stent fracture via ultrasonographer interpretation.  Velocities of less than 100 cm noted throughout the remainder of the graft and its native vessels with a decreased flow volume.  Total flow volume is 503.  Velocities at the left distal radial artery are not consistent with a significant fistula steal.  The elevated velocity in the mid ABGs a new finding when compared to the previous exam on August 11, 2017.  The patient denies any uremic symptoms.  The patient denies any left upper extremity pain or ulcer formation to the hand/fingers.  The patient denies any fever, nausea or vomiting.  Review of Systems  Constitutional: Negative.   HENT: Negative.   Eyes: Negative.   Respiratory: Negative.   Cardiovascular: Negative.   Gastrointestinal: Negative.   Endocrine: Negative.   Genitourinary:       ESRD  Musculoskeletal: Negative.   Skin: Negative.   Allergic/Immunologic: Negative.   Neurological: Negative.   Hematological: Negative.   Psychiatric/Behavioral: Negative.       Objective:   Physical Exam  Constitutional: He is oriented to person, place, and time. He appears well-developed and well-nourished. No distress.  HENT:  Head: Normocephalic and atraumatic.  Right Ear: External ear normal.  Left Ear: External ear normal.  Eyes: Pupils are equal, round, and reactive to light.  Conjunctivae are normal.  Neck: Normal range of motion.  Cardiovascular: Normal rate, regular rhythm, normal heart sounds and intact distal pulses.  Pulses:      Radial pulses are 2+ on the right side, and 2+ on the left side.  Left upper extremity brachial axillary graft: Pulsitile thrill and bruit noted.  Skin is intact.  Pulmonary/Chest: Effort normal and breath sounds normal.  Musculoskeletal: Normal range of motion. He exhibits no edema.  Neurological: He is alert and oriented to person, place, and time.  Skin: Skin is warm and dry. He is not diaphoretic.  Psychiatric: He has a normal mood and affect. His behavior is normal. Judgment and thought content normal.  Vitals reviewed.  BP 129/70 (BP Location: Right Arm, Patient Position: Sitting)   Pulse (!) 56   Resp 18   Ht 5\' 4"  (1.626 m)   Wt 173 lb (78.5 kg)   BMI 29.70 kg/m   Past Medical History:  Diagnosis Date  . Hyperlipidemia   . Hypertension   . Hypothyroidism   . Idiopathic thrombocytopenia purpura ([place])   . Kidney stones   . Recurrent UTI   . Renal agenesis    discovered at age 9  . Type 2 diabetes mellitus ([place])   . Ureteral stricture    Social History   Socioeconomic History  . Marital status: Married    Spouse name: Not on file  . Number of children: Not on file  . Years of education: Not on file  . Highest education level: Not on  file  Occupational History  . Occupation: retired  Scientific laboratory technician  . Financial resource strain: Not on file  . Food insecurity:    Worry: Not on file    Inability: Not on file  . Transportation needs:    Medical: Not on file    Non-medical: Not on file  Tobacco Use  . Smoking status: Never Smoker  . Smokeless tobacco: Never Used  Substance and Sexual Activity  . Alcohol use: No    Alcohol/week: 0.0 standard drinks  . Drug use: No  . Sexual activity: Never  Lifestyle  . Physical activity:    Days per week: Not on file    Minutes per session: Not on file  .  Stress: Not on file  Relationships  . Social connections:    Talks on phone: Not on file    Gets together: Not on file    Attends religious service: Not on file    Active member of club or organization: Not on file    Attends meetings of clubs or organizations: Not on file    Relationship status: Not on file  . Intimate partner violence:    Fear of current or ex partner: Not on file    Emotionally abused: Not on file    Physically abused: Not on file    Forced sexual activity: Not on file  Other Topics Concern  . Not on file  Social History Narrative  . Not on file   Past Surgical History:  Procedure Laterality Date  . A/V FISTULAGRAM Left 03/31/2017   Procedure: A/V Fistulagram;  Surgeon: [name], MD;  Location: [place] [facility];  Service: Cardiovascular;  Laterality: Left;  . A/V SHUNTOGRAM Left 07/28/2017   Procedure: A/V SHUNTOGRAM;  Surgeon: [name], MD;  Location: [place] [facility];  Service: Cardiovascular;  Laterality: Left;  . APPENDECTOMY    . AV FISTULA PLACEMENT Left 04/16/2016   Procedure: INSERTION OF ARTERIOVENOUS (AV) GORE-TEX GRAFT ARM;  Surgeon: [name], MD;  Location: [facility];  Service: Vascular;  Laterality: Left;  . CATARACT EXTRACTION W/ INTRAOCULAR LENS IMPLANT Bilateral 2014   done 1-2 months apart at [facility]  [name] FINGER SURGERY Left 2002   pinky finger  . KIDNEY STONE SURGERY    . NASAL SINUS SURGERY  1985  . PERIPHERAL VASCULAR CATHETERIZATION  05/13/2016   Procedure: Upper Extremity Angiography;  Surgeon: [name], MD;  Location: [place] [facility];  Service: Cardiovascular;;  . PERIPHERAL VASCULAR CATHETERIZATION  05/13/2016   Procedure: Upper Extremity Intervention;  Surgeon: [name], MD;  Location: [place] [facility];  Service: Cardiovascular;;  . PICC LINE PLACE PERIPHERAL ([place] HX) Right 08/2015  . PICC LINE REMOVAL ([place] HX) Right 09/2015   Family History  Problem Relation Age of Onset    . Cervical cancer Sister    No Known Allergies     Assessment & Plan:  Patient presents at the request of his dialysis center due to progressively decreasing transonic flow rates.  The patient was last seen on Nov 12, 2017 status post a recent shuntogram on 07/28/2017.  The patient underwent a left upper extremity HDA which was notable for a patent left brachial axillary AV graft with a hemodynamically significant stenosis at the mid upper arm level graft which may be due to internal vessel narrowing versus stent fracture via ultrasonographer interpretation.  Velocities of less than 100 cm noted throughout the remainder of the graft and its  native vessels with a decreased flow volume.  Total flow volume is 503.  Velocities at the left distal radial artery are not consistent with a significant fistula steal.  The elevated velocity in the mid ABGs a new finding when compared to the previous exam on August 11, 2017.  The patient denies any uremic symptoms.  The patient denies any left upper extremity pain or ulcer formation to the hand/fingers.  The patient denies any fever, nausea or vomiting.  1. Complication of vascular access for dialysis, sequela - Stable Patient with decreasing transonic flows noted at dialysis New area of stenosis noted on today's HDA which was not present when compared to the previous on July 28, 2017 In an effort to keep the patient's dialysis access functioning recommend a left upper extremity shuntogram with possible intervention with possible PermCath insertion. Procedure, risks and benefits explained to the patient All questions answered Patient wishes to proceed The patient dialyzes Monday Wednesday Friday at [facility] on [address].  2. ESRD on dialysis ([place]) - Stable As above  Current Outpatient Medications on File Prior to Visit  Medication Sig Dispense Refill  . atorvastatin (LIPITOR) 10 MG tablet Take 10 mg by mouth daily.    . calcitRIOL (ROCALTROL) 0.25  MCG capsule     . Carboxymethylcellulose Sodium (THERATEARS) 0.25 % SOLN Place 1-2 drops into both eyes 3 (three) times daily as needed (for dry eyes).    . furosemide (LASIX) 40 MG tablet Take 40 mg by mouth 2 (two) times daily. In the morning and in the afternoon    . hydrALAZINE (APRESOLINE) 25 MG tablet Take 25 mg by mouth 2 (two) times daily.    . insulin detemir (LEVEMIR) 100 UNIT/ML injection Inject 20 Units into the skin daily before breakfast.     . levothyroxine (SYNTHROID, LEVOTHROID) 112 MCG tablet Take 112 mcg by mouth daily before breakfast.    . lidocaine-prilocaine (EMLA) cream APPLY TO ACCESS SITE 30 MINUTES PRIOR TO DIALYSIS  11  . lisinopril (PRINIVIL,ZESTRIL) 20 MG tablet Take 20 mg by mouth daily.     . Omega-3 1000 MG CAPS Take 2,000 mg by mouth 2 (two) times daily.     [name] ACCU-CHEK AVIVA PLUS test strip     . acetaminophen (TYLENOL) 500 MG tablet Take 1,000 mg by mouth every 6 (six) hours as needed for moderate pain or headache.     . BD PEN NEEDLE NANO U/F 32G X 4 MM MISC      Current Facility-Administered Medications on File Prior to Visit  Medication Dose Route Frequency Provider Last Rate Last Dose  . heparin lock flush 100 unit/mL  500 Units Intravenous Once [name], MD      . sodium chloride flush (NS) 0.9 % injection 10 mL  10 mL Intravenous PRN [name], MD      . sodium chloride flush (NS) 0.9 % injection 10 mL  10 mL Intravenous PRN [name], MD   10 mL at 08/16/15 0857   There are no Patient Instructions on file for this visit. No follow-ups on file.  KIMBERLY A STEGMAYER, PA-C

## 2018-03-19 ENCOUNTER — Other Ambulatory Visit (INDEPENDENT_AMBULATORY_CARE_PROVIDER_SITE_OTHER): Payer: Self-pay | Admitting: Nurse Practitioner

## 2018-03-19 ENCOUNTER — Encounter (INDEPENDENT_AMBULATORY_CARE_PROVIDER_SITE_OTHER): Payer: Self-pay

## 2018-03-23 MED ORDER — CEFAZOLIN SODIUM-DEXTROSE 1-4 GM/50ML-% IV SOLN
1.0000 g | Freq: Once | INTRAVENOUS | Status: AC
  Administered 2018-03-24: 1 g via INTRAVENOUS

## 2018-03-24 ENCOUNTER — Ambulatory Visit
Admission: RE | Admit: 2018-03-24 | Discharge: 2018-03-24 | Disposition: A | Discharge status: Home / Self Care | Payer: Medicare HMO | Source: Ambulatory Visit | Attending: Vascular Surgery | Admitting: Vascular Surgery

## 2018-03-24 ENCOUNTER — Encounter
Admission: RE | Disposition: A | Discharge status: Home / Self Care | Payer: Self-pay | Source: Ambulatory Visit | Attending: Vascular Surgery

## 2018-03-24 ENCOUNTER — Other Ambulatory Visit: Payer: Self-pay

## 2018-03-24 DIAGNOSIS — Z79899 Other long term (current) drug therapy: Secondary | ICD-10-CM | POA: Insufficient documentation

## 2018-03-24 DIAGNOSIS — Z794 Long term (current) use of insulin: Secondary | ICD-10-CM | POA: Insufficient documentation

## 2018-03-24 DIAGNOSIS — Z79890 Hormone replacement therapy (postmenopausal): Secondary | ICD-10-CM | POA: Insufficient documentation

## 2018-03-24 DIAGNOSIS — Z87442 Personal history of urinary calculi: Secondary | ICD-10-CM | POA: Diagnosis not present

## 2018-03-24 DIAGNOSIS — I120 Hypertensive chronic kidney disease with stage 5 chronic kidney disease or end stage renal disease: Secondary | ICD-10-CM | POA: Insufficient documentation

## 2018-03-24 DIAGNOSIS — Z9841 Cataract extraction status, right eye: Secondary | ICD-10-CM | POA: Diagnosis not present

## 2018-03-24 DIAGNOSIS — T82858A Stenosis of vascular prosthetic devices, implants and grafts, initial encounter: Secondary | ICD-10-CM | POA: Insufficient documentation

## 2018-03-24 DIAGNOSIS — Z955 Presence of coronary angioplasty implant and graft: Secondary | ICD-10-CM | POA: Insufficient documentation

## 2018-03-24 DIAGNOSIS — Z9889 Other specified postprocedural states: Secondary | ICD-10-CM | POA: Diagnosis not present

## 2018-03-24 DIAGNOSIS — E1122 Type 2 diabetes mellitus with diabetic chronic kidney disease: Secondary | ICD-10-CM | POA: Insufficient documentation

## 2018-03-24 DIAGNOSIS — N186 End stage renal disease: Secondary | ICD-10-CM | POA: Diagnosis not present

## 2018-03-24 DIAGNOSIS — Y832 Surgical operation with anastomosis, bypass or graft as the cause of abnormal reaction of the patient, or of later complication, without mention of misadventure at the time of the procedure: Secondary | ICD-10-CM | POA: Diagnosis not present

## 2018-03-24 DIAGNOSIS — Z9842 Cataract extraction status, left eye: Secondary | ICD-10-CM | POA: Diagnosis not present

## 2018-03-24 DIAGNOSIS — E785 Hyperlipidemia, unspecified: Secondary | ICD-10-CM | POA: Insufficient documentation

## 2018-03-24 DIAGNOSIS — Z87440 Personal history of urinary (tract) infections: Secondary | ICD-10-CM | POA: Insufficient documentation

## 2018-03-24 DIAGNOSIS — Z992 Dependence on renal dialysis: Secondary | ICD-10-CM | POA: Insufficient documentation

## 2018-03-24 DIAGNOSIS — E039 Hypothyroidism, unspecified: Secondary | ICD-10-CM | POA: Insufficient documentation

## 2018-03-24 HISTORY — PX: A/V SHUNTOGRAM: CATH118297

## 2018-03-24 LAB — GLUCOSE, CAPILLARY
GLUCOSE-CAPILLARY: 104 mg/dL — AB (ref 70–99)
Glucose-Capillary: 98 mg/dL (ref 70–99)

## 2018-03-24 LAB — POTASSIUM (ARMC VASCULAR LAB ONLY): POTASSIUM (ARMC VASCULAR LAB): 4.4 (ref 3.5–5.1)

## 2018-03-24 SURGERY — A/V SHUNTOGRAM
Anesthesia: Moderate Sedation | Laterality: Left

## 2018-03-24 MED ORDER — MIDAZOLAM HCL 2 MG/2ML IJ SOLN
INTRAMUSCULAR | Status: DC | PRN
  Administered 2018-03-24: 1 mg via INTRAVENOUS
  Administered 2018-03-24: 0.5 mg via INTRAVENOUS

## 2018-03-24 MED ORDER — FENTANYL CITRATE (PF) 100 MCG/2ML IJ SOLN
INTRAMUSCULAR | Status: AC
  Filled 2018-03-24: qty 2

## 2018-03-24 MED ORDER — CEFAZOLIN SODIUM-DEXTROSE 1-4 GM/50ML-% IV SOLN
INTRAVENOUS | Status: AC
  Administered 2018-03-24: 1 g via INTRAVENOUS
  Filled 2018-03-24: qty 50

## 2018-03-24 MED ORDER — FENTANYL CITRATE (PF) 100 MCG/2ML IJ SOLN
INTRAMUSCULAR | Status: DC | PRN
  Administered 2018-03-24: 25 ug via INTRAVENOUS
  Administered 2018-03-24: 50 ug via INTRAVENOUS

## 2018-03-24 MED ORDER — HEPARIN SODIUM (PORCINE) 1000 UNIT/ML IJ SOLN
INTRAMUSCULAR | Status: AC
  Filled 2018-03-24: qty 1

## 2018-03-24 MED ORDER — ONDANSETRON HCL 4 MG/2ML IJ SOLN: 4.0000 mg | Freq: Four times a day (QID) | INTRAMUSCULAR | Status: DC | PRN

## 2018-03-24 MED ORDER — MIDAZOLAM HCL 5 MG/5ML IJ SOLN
INTRAMUSCULAR | Status: AC
  Filled 2018-03-24: qty 5

## 2018-03-24 MED ORDER — HYDROMORPHONE HCL 1 MG/ML IJ SOLN: 1.0000 mg | Freq: Once | INTRAMUSCULAR | Status: DC | PRN

## 2018-03-24 MED ORDER — HEPARIN SODIUM (PORCINE) 1000 UNIT/ML IJ SOLN
INTRAMUSCULAR | Status: DC | PRN
  Administered 2018-03-24: 3000 [IU] via INTRAVENOUS

## 2018-03-24 MED ORDER — LIDOCAINE HCL (PF) 1 % IJ SOLN
INTRAMUSCULAR | Status: AC
  Filled 2018-03-24: qty 30

## 2018-03-24 MED ORDER — HEPARIN (PORCINE) IN NACL 1000-0.9 UT/500ML-% IV SOLN
INTRAVENOUS | Status: AC
  Filled 2018-03-24: qty 1000

## 2018-03-24 MED ORDER — SODIUM CHLORIDE 0.9 % IV SOLN
INTRAVENOUS | Status: DC
  Administered 2018-03-24: 09:00:00 via INTRAVENOUS

## 2018-03-24 MED ORDER — SODIUM CHLORIDE FLUSH 0.9 % IV SOLN
INTRAVENOUS | Status: AC
  Filled 2018-03-24: qty 20

## 2018-03-24 SURGICAL SUPPLY — 17 items
BALLN DORADO 8X60X80 (BALLOONS) ×3
BALLN LUTONIX AV 12X40X75 (BALLOONS) ×3
BALLN ULTRVRSE 10X40X75 (BALLOONS) ×3
BALLOON DORADO 8X60X80 (BALLOONS) ×1 IMPLANT
BALLOON LUTONIX AV 12X40X75 (BALLOONS) ×1 IMPLANT
BALLOON ULTRVRSE 10X40X75 (BALLOONS) ×1 IMPLANT
DEVICE PRESTO INFLATION (MISCELLANEOUS) ×3 IMPLANT
DRAPE BRACHIAL (DRAPES) ×3 IMPLANT
NEEDLE ENTRY 21GA 7CM ECHOTIP (NEEDLE) ×3 IMPLANT
PACK ANGIOGRAPHY (CUSTOM PROCEDURE TRAY) ×3 IMPLANT
SET INTRO CAPELLA COAXIAL (SET/KITS/TRAYS/PACK) ×3 IMPLANT
SHEATH BRITE TIP 6FRX5.5 (SHEATH) ×3 IMPLANT
SHEATH BRITE TIP 7FRX5.5 (SHEATH) ×3 IMPLANT
SHEATH BRITE TIP 8FRX11 (SHEATH) ×3 IMPLANT
STENT COVERA STRAIGHT 9X40X80 (Permanent Stent) ×3 IMPLANT
SUT MNCRL AB 4-0 PS2 18 (SUTURE) ×3 IMPLANT
WIRE MAGIC TORQUE 260C (WIRE) ×6 IMPLANT

## 2018-03-24 NOTE — Discharge Instructions (Signed)
Moderate Conscious Sedation, Adult, Care After °These instructions provide you with information about caring for yourself after your procedure. Your health care provider may also give you more specific instructions. Your treatment has been planned according to current medical practices, but problems sometimes occur. Call your health care provider if you have any problems or questions after your procedure. °What can I expect after the procedure? °After your procedure, it is common: °· To feel sleepy for several hours. °· To feel clumsy and have poor balance for several hours. °· To have poor judgment for several hours. °· To vomit if you eat too soon. ° °Follow these instructions at home: °For at least 24 hours after the procedure: ° °· Do not: °? Participate in activities where you could fall or become injured. °? Drive. °? Use heavy machinery. °? Drink alcohol. °? Take sleeping pills or medicines that cause drowsiness. °? Make important decisions or sign legal documents. °? Take care of children on your own. °· Rest. °Eating and drinking °· Follow the diet recommended by your health care provider. °· If you vomit: °? Drink water, juice, or soup when you can drink without vomiting. °? Make sure you have little or no nausea before eating solid foods. °General instructions °· Have a responsible adult stay with you until you are awake and alert. °· Take over-the-counter and prescription medicines only as told by your health care provider. °· If you smoke, do not smoke without supervision. °· Keep all follow-up visits as told by your health care provider. This is important. °Contact a health care provider if: °· You keep feeling nauseous or you keep vomiting. °· You feel light-headed. °· You develop a rash. °· You have a fever. °Get help right away if: °· You have trouble breathing. °This information is not intended to replace advice given to you by your health care provider. Make sure you discuss any questions you have  with your health care provider. °Document Released: 03/23/2013 Document Revised: 11/05/2015 Document Reviewed: 09/22/2015 °Elsevier Interactive Patient Education © 2018 Elsevier Inc. °Fistulogram, Care After °Refer to this sheet in the next few weeks. These instructions provide you with information on caring for yourself after your procedure. Your health care provider may also give you more specific instructions. Your treatment has been planned according to current medical practices, but problems sometimes occur. Call your health care provider if you have any problems or questions after your procedure. °What can I expect after the procedure? °After your procedure, it is typical to have the following: °· A small amount of discomfort in the area where the catheters were placed. °· A small amount of bruising around the fistula. °· Sleepiness and fatigue. ° °Follow these instructions at home: °· Rest at home for the day following your procedure. °· Do not drive or operate heavy machinery while taking pain medicine. °· Take medicines only as directed by your health care provider. °· Do not take baths, swim, or use a hot tub until your health care provider approves. You may shower 24 hours after the procedure or as directed by your health care provider. °· There are many different ways to close and cover an incision, including stitches, skin glue, and adhesive strips. Follow your health care provider's instructions on: °? Incision care. °? Bandage (dressing) changes and removal. °? Incision closure removal. °· Monitor your dialysis fistula carefully. °Contact a health care provider if: °· You have drainage, redness, swelling, or pain at your catheter site. °· You have   a fever.  You have chills. Get help right away if:  You feel weak.  You have trouble balancing.  You have trouble moving your arms or legs.  You have problems with your speech or vision.  You can no longer feel a vibration or buzz when you put  your fingers over your dialysis fistula.  The limb that was used for the procedure: ? Swells. ? Is painful. ? Is cold. ? Is discolored, such as blue or pale white. This information is not intended to replace advice given to you by your health care provider. Make sure you discuss any questions you have with your health care provider. Document Released: 10/17/2013 Document Revised: 11/08/2015 Document Reviewed: 07/22/2013 Elsevier Interactive Patient Education  2018 [place] Catheter Insertion, Care After Refer to this sheet in the next few weeks. These instructions provide you with information about caring for yourself after your procedure. Your health care provider may also give you more specific instructions. Your treatment has been planned according to current medical practices, but problems sometimes occur. Call your health care provider if you have any problems or questions after your procedure. What can I expect after the procedure? After the procedure, it is common to have:  Some mild redness, swelling, and pain around your catheter site.  A small amount of blood or clear fluid coming from your incisions.  Follow these instructions at home: Incision care  Check your incision areas every day for signs of infection. Check for: ? More redness, swelling, or pain. ? More fluid or blood. ? Warmth. ? Pus or a bad smell.  Follow instructions from your health care provider about how to take care of your incisions. Make sure you: ? Wash your hands with soap and water before you change your bandages (dressings). If soap and water are not available, use hand sanitizer. ? Change your dressings as told by your health care provider. Wash the area around your incisions with a germ-killing (antiseptic) solution when you change your dressing, as told by your health care provider. ? Leave stitches (sutures), skin glue, or adhesive strips in place. These skin closures may need to stay  in place for 2 weeks or longer. If adhesive strip edges start to loosen and curl up, you may trim the loose edges. Do not remove adhesive strips completely unless your health care provider tells you to do that. Catheter Care   Wash your hands with soap and water before and after caring for your catheter. If soap and water are not available, use hand sanitizer.  Keep your catheter site and your dressings clean and dry.  Apply an antibiotic ointment to your catheter site as told by your health care provider.  Flush your catheter as told by your health care provider. This helps prevent it from becoming clogged.  Do not open the caps on the ends of the catheter.  Do not pull on your catheter.  If your catheter is in your arm: ? Avoid wearing tight clothes or tight jewelry on your arm that has the catheter. ? Do not sleep with your head on the arm that has the catheter. ? Do not allow your blood pressure to be taken on the arm that has the catheter. ? Do not allow your blood to be drawn from the arm that has the catheter, except through the catheter itself. Medicines  Take over-the-counter and prescription medicines only as told by your health care provider.  If you were prescribed an antibiotic  medicine, take it as told by your health care provider. Do not stop taking the antibiotic even if you start to feel better. Activity  Return to your normal activities as told by your health care provider. Ask your health care provider what activities are safe for you.  Do not lift anything that is heavier than 10 lb (4.5 kg) for 3 weeks or as long as told by your health care provider. Driving  Do not drive until your health care provider approves.  Do not drive or operate heavy machinery while taking prescription pain medicine. General instructions  Follow your health care provider's specific instructions for the type of catheter that you have.  Do not take baths, swim, or use a hot tub  until your health care provider approves.  Follow instructions from your health care provider about eating or drinking restrictions.  Wear compression stockings as told by your health care provider. These stockings help to prevent blood clots and reduce swelling in your legs.  Keep all follow-up visits as told by your health care provider. This is important. Contact a health care provider if:  You have more fluid or blood coming from your incisions.  You have more redness, swelling, or pain at your incisions or around the area where your catheter is inserted.  Your incisions feel warm to the touch.  You feel unusually weak.  You feel nauseous.  Your catheter is not working properly.  You have blood or fluid draining from your catheter.  You are unable to flush your catheter. Get help right away if:  Your catheter breaks.  A hole develops in your catheter.  Your catheter comes loose or gets pulled completely out. If this happens, press on your catheter site firmly with your hand or a clean cloth until you get medical help.  Your catheter becomes blocked.  You have swelling in your arm, shoulder, neck, or face.  You develop chest pain.  You have difficulty breathing.  You feel dizzy or light-headed.  You have pus or a bad smell coming from your incisions.  You have a fever.  You develop bleeding from your catheter or your insertion site, and your bleeding does not stop. This information is not intended to replace advice given to you by your health care provider. Make sure you discuss any questions you have with your health care provider. Document Released: 05/19/2012 Document Revised: 02/03/2016 Document Reviewed: 02/26/2015 Elsevier Interactive Patient Education  [employer].

## 2018-03-24 NOTE — Op Note (Signed)
OPERATIVE NOTE   PROCEDURE: 1. Contrast injection left brachial axillary AV access 2. Percutaneous transluminal angioplasty and stent placement peripheral segment with an 8 mm x 40 mm Covera 3. Percutaneous transluminal angioplasty central venous segment to 12 mm with a Lutonix drug-eluting balloon  PRE-OPERATIVE DIAGNOSIS: Complication of dialysis access                                                       End Stage Renal Disease  POST-OPERATIVE DIAGNOSIS: same as above   SURGEON: [name], M.D.  ANESTHESIA: Conscious sedation was administered under my direct supervision by the interventional radiology RN. IV Versed plus fentanyl were utilized. Continuous ECG, pulse oximetry and blood pressure was monitored throughout the entire procedure.  Conscious sedation was for a total of 45.  ESTIMATED BLOOD LOSS: minimal  FINDING(S): Stricture of the AV graft within the peripheral segment as well as a stricture within the central venous portion  SPECIMEN(S):  None  CONTRAST: 30 cc  FLUOROSCOPY TIME: 4.1 minutes  INDICATIONS: Alejandro Armstrong is a 84 y.o. male who  presents with malfunctioning left arm AV access.  The patient is scheduled for angiography with possible intervention of the AV access.  The patient is aware the risks include but are not limited to: bleeding, infection, thrombosis of the cannulated access, and possible anaphylactic reaction to the contrast.  The patient acknowledges if the access can not be salvaged a tunneled catheter will be needed and will be placed during this procedure.  The patient is aware of the risks of the procedure and elects to proceed with the angiogram and intervention.  DESCRIPTION: After full informed written consent was obtained, the patient was brought back to the Special Procedure suite and placed supine position.  Appropriate cardiopulmonary monitors were placed.  The left arm was prepped and draped in the standard fashion.   Appropriate timeout is called. The left brachial axillary AV graft was cannulated with a micropuncture needle.  Cannulation was performed with ultrasound guidance. Ultrasound was placed in a sterile sleeve, the AV access was interrogated and noted to be echolucent and compressible indicating patency. Image was recorded for the permanent record. The puncture is performed under continuous ultrasound visualization.   The microwire was advanced and the needle was exchanged for  a microsheath.  The J-wire was then advanced and a 6 Fr sheath inserted.  Hand injections were completed to image the access from the arterial anastomosis through the entire access.  The central venous structures were also imaged by hand injections.  Based on the images, there is a greater than 90% stenosis at the leading edge of the previously placed via bond stent within the midportion of the graft.  The venous outflow which has been stented several times remains widely patent.  The arterial portion of the graft and the inflow is widely patent.  Central veins are also imaged the axillary vein demonstrates stents as noted above.  The subclavian vein demonstrates a string sign at the confluence.  This also explains the reflux of comp contrast and the retrograde flow noted within the cephalic vein proper from the confluence down to the mid biceps level.  The innominate and superior vena cava are widely patent.  3000 units of heparin was given and allowed to circulate for several minutes.  A  Magic torque wire was negotiated through the strictures within the venous portion of the graft as well as the central stenosis. The sheath was then upsized first to a 7 [place] sheath and later to an 8 [place] sheath.  An 8 mm by a 60 mm Dorado balloon was used to dilate the peripheral lesion in the midportion of the graft first.  Inflation was to 26 atm for 1 minute.  The detector was then repositioned over the central portion of the AV access and the 8 mm  Dorado balloon was used to treat the stricture within the subclavian vein. Inflation was to 26 for 1 minutes.  Follow-up imaging of the central lesion demonstrated that it was improved but not yet less than 20% residual and therefore a 12 mm x 40 mm Lutonix drug-eluting balloon was used to dilate the central lesion.  Inflation was to 14 atm for 1 full minute.  Follow-up imaging demonstrated less than 10% residual stenosis.  Reflux of contrast into the cephalic vein was no longer present as well.  The detector was then repositioned over the lesion in the midportion of the graft.  A 9 mm x 40 mm straight Covera was then opened onto the field was advanced across the stenotic area and deployed without difficulty.  It was postdilated first with the 8 mm Dorado balloon inflated to 20 atm it remains somewhat undersized and therefore a 10 x 40 Ultraverse balloon was advanced across the narrowed area and inflated to 16 atm for 1 minute.  Follow-up imaging demonstrated less than 5% residual stenosis.  A 4-0 Monocryl purse-string suture was sewn around the sheath.  The sheath was removed and light pressure was applied.  A sterile bandage was applied to the puncture site.    COMPLICATIONS: None  CONDITION: Alejandro Armstrong, M.D [facility] Office: [phone]  03/24/2018 11:02 AM

## 2018-03-24 NOTE — H&P (Signed)
[facility] History & Physical Update  The patient was interviewed and re-examined.  The patient's previous History and Physical has been reviewed and is unchanged.  There is no change in the plan of care. We plan to proceed with the scheduled procedure.  Hortencia Pilar, MD  03/24/2018, 10:00 AM

## 2018-04-04 NOTE — Progress Notes (Signed)
[facility]  [phone] Fax:([phone]) [phone]  ID: [name] OB: June 28, 1929  MR#: [id number]  SFK#:[id number]  Patient Care Team: Casilda Carls, MD as PCP - General (Internal Medicine)  CHIEF COMPLAINT: Bone marrow biopsy confirmed ITP.  INTERVAL HISTORY: Patient returns to clinic today for repeat laboratory work and routine six-month evaluation.  He continues to feel well and remains asymptomatic.  He denies any easy bleeding or bruising.  He does not complain of any weakness or fatigue.  He has no neurologic complaints.  He denies any recent fevers or illnesses.  He has no chest pain or shortness of breath. He denies any nausea, vomiting, constipation, or diarrhea. He has no urinary complaints.  Patient feels at his baseline offers no specific complaints today.  REVIEW OF SYSTEMS:   Review of Systems  Constitutional: Negative.  Negative for fever, malaise/fatigue and weight loss.  Respiratory: Negative.  Negative for cough and shortness of breath.   Cardiovascular: Negative.  Negative for chest pain and leg swelling.  Gastrointestinal: Negative.  Negative for blood in stool and melena.  Genitourinary: Negative.  Negative for dysuria and hematuria.  Musculoskeletal: Negative.  Negative for back pain.  Skin: Negative.  Negative for rash.  Neurological: Negative.  Negative for sensory change, focal weakness, weakness and headaches.  Endo/Heme/Allergies: Does not bruise/bleed easily.  Psychiatric/Behavioral: Negative.  The patient is not nervous/anxious.     As per HPI. Otherwise, a complete review of systems is negative.  PAST MEDICAL HISTORY: Past Medical History:  Diagnosis Date  . Hyperlipidemia   . Hypertension   . Hypothyroidism   . Idiopathic thrombocytopenia purpura ([place])   . Kidney stones   . Recurrent UTI   . Renal agenesis    discovered at age 19  . Type 2 diabetes mellitus ([place])   . Ureteral stricture     PAST SURGICAL  HISTORY: Past Surgical History:  Procedure Laterality Date  . A/V FISTULAGRAM Left 03/31/2017   Procedure: A/V Fistulagram;  Surgeon: [name], MD;  Location: [facility];  Service: Cardiovascular;  Laterality: Left;  . A/V SHUNTOGRAM Left 07/28/2017   Procedure: A/V SHUNTOGRAM;  Surgeon: [name], MD;  Location: [facility];  Service: Cardiovascular;  Laterality: Left;  . A/V SHUNTOGRAM Left 03/24/2018   Procedure: A/V SHUNTOGRAM;  Surgeon: [name], MD;  Location: [facility];  Service: Cardiovascular;  Laterality: Left;  . APPENDECTOMY    . AV FISTULA PLACEMENT Left 04/16/2016   Procedure: INSERTION OF ARTERIOVENOUS (AV) GORE-TEX GRAFT ARM;  Surgeon: [name], MD;  Location: [facility];  Service: Vascular;  Laterality: Left;  . CATARACT EXTRACTION W/ INTRAOCULAR LENS IMPLANT Bilateral 2014   done 1-2 months apart at [facility].  [name] FINGER SURGERY Left 2002   pinky finger  . KIDNEY STONE SURGERY    . NASAL SINUS SURGERY  1985  . PERIPHERAL VASCULAR CATHETERIZATION  05/13/2016   Procedure: Upper Extremity Angiography;  Surgeon: [name], MD;  Location: [facility];  Service: Cardiovascular;;  . PERIPHERAL VASCULAR CATHETERIZATION  05/13/2016   Procedure: Upper Extremity Intervention;  Surgeon: [name], MD;  Location: [facility];  Service: Cardiovascular;;  . PICC LINE PLACE PERIPHERAL ([place] HX) Right 08/2015  . PICC LINE REMOVAL ([place] HX) Right 09/2015    FAMILY HISTORY: No reported history of malignancy or chronic disease.      ADVANCED DIRECTIVES:    HEALTH MAINTENANCE: Social History  Tobacco Use  . Smoking status: Never Smoker  . Smokeless tobacco: Never Used  Substance Use Topics  . Alcohol use: No    Alcohol/week: 0.0 standard drinks  . Drug use: No     Colonoscopy:  PAP:  Bone density:  Lipid panel:  No Known Allergies  Current Outpatient Medications  Medication Sig  Dispense Refill  . ACCU-CHEK AVIVA PLUS test strip     . acetaminophen (TYLENOL) 500 MG tablet Take 1,000 mg by mouth every 6 (six) hours as needed for moderate pain or headache.     [name] amLODipine (NORVASC) 5 MG tablet Take 5 mg by mouth daily.    [name] atorvastatin (LIPITOR) 10 MG tablet Take 10 mg by mouth daily.    . BD PEN NEEDLE NANO U/F 32G X 4 MM MISC     . calcitRIOL (ROCALTROL) 0.25 MCG capsule     . Carboxymethylcellulose Sodium (THERATEARS) 0.25 % SOLN Place 1-2 drops into both eyes 3 (three) times daily as needed (for dry eyes).    . furosemide (LASIX) 40 MG tablet Take 40 mg by mouth 2 (two) times daily. In the morning and in the afternoon    . hydrALAZINE (APRESOLINE) 25 MG tablet Take 25 mg by mouth 2 (two) times daily.    . insulin detemir (LEVEMIR) 100 UNIT/ML injection Inject 20 Units into the skin daily before breakfast.     . levothyroxine (SYNTHROID, LEVOTHROID) 112 MCG tablet Take 112 mcg by mouth daily before breakfast.    . lidocaine-prilocaine (EMLA) cream APPLY TO ACCESS SITE 30 MINUTES PRIOR TO DIALYSIS  11  . lisinopril (PRINIVIL,ZESTRIL) 20 MG tablet Take 20 mg by mouth daily.     . Omega-3 1000 MG CAPS Take 2,000 mg by mouth 2 (two) times daily.      No current facility-administered medications for this visit.    Facility-Administered Medications Ordered in Other Visits  Medication Dose Route Frequency Provider Last Rate Last Dose  . heparin lock flush 100 unit/mL  500 Units Intravenous Once [name], MD      . sodium chloride flush (NS) 0.9 % injection 10 mL  10 mL Intravenous PRN [name], MD      . sodium chloride flush (NS) 0.9 % injection 10 mL  10 mL Intravenous PRN [name], MD   10 mL at 08/16/15 0857    OBJECTIVE: Vitals:   04/08/18 1429  BP: 115/61  Pulse: 66  Temp: 97.7 F (36.5 C)     Body mass index is 30.07 kg/m.    ECOG FS:0 - Asymptomatic  General: Well-developed, well-nourished, no acute distress. Eyes:  Pink conjunctiva, anicteric sclera. HEENT: Normocephalic, moist mucous membranes. Lungs: Clear to auscultation bilaterally. Heart: Regular rate and rhythm. No rubs, murmurs, or gallops. Abdomen: Soft, nontender, nondistended. No organomegaly noted, normoactive bowel sounds. Musculoskeletal: No edema, cyanosis, or clubbing. Neuro: Alert, answering all questions appropriately. Cranial nerves grossly intact. Skin: No rashes or petechiae noted. Psych: Normal affect.  LAB RESULTS:  Lab Results  Component Value Date   NA 141 04/02/2016   K 4.7 02/05/2018   CL 117 (H) 02/05/2018   CO2 22 04/02/2016   GLUCOSE 137 (H) 04/02/2016   BUN 53 (H) 05/07/2016   CREATININE 6.10 (H) 05/07/2016   CALCIUM 9.5 04/02/2016   PROT 5.8 (L) 09/18/2015   ALBUMIN 2.9 (L) 09/18/2015   AST 23 09/18/2015   ALT 18 09/18/2015   ALKPHOS 105 09/18/2015   BILITOT 0.4 09/18/2015  GFRNONAA 8 (L) 05/07/2016   GFRAA 9 (L) 05/07/2016    Lab Results  Component Value Date   WBC 5.7 04/08/2018   NEUTROABS 2.7 04/08/2018   HGB 10.2 (L) 04/08/2018   HCT 30.8 (L) 04/08/2018   MCV 114.5 (H) 04/08/2018   PLT 98 (L) 04/08/2018     STUDIES: No results found.  ASSESSMENT:  Bone marrow biopsy confirmed ITP.  PLAN:    1. ITP: Bone marrow biopsy on August 02, 2015 revealed normal megakaryocytes as well as normal trilineage hematopoiesis thus suggesting peripheral destruction of his platelets.  Infectious workup did not reveal an underlying viral etiology. Patient completed 4 weekly cycles of Rituxan on August 23, 2015.  Patient's platelet count is slowly trending down and is now 98.  He does not require additional Rituxan at this time, but I suspect he will need some in the near future.  Return to clinic in 3 months with repeat laboratory work only and then in 6 months with repeat laboratory work and further evaluation.    2. ESRD: Continue dialysis Mondays, Wednesdays, and Fridays. 3.  Anemia: Mild.  Patient's  hemoglobin continues to be greater than 10.0 at 10.2, therefore he does not require Procrit at this time. 4.  Macrocytosis: Previously B12 and folate were within normal limits.  Patient expressed understanding and was in agreement with this plan. He also understands that he can call clinic at any time with any questions, concerns, or complaints.   [name], MD   04/13/2018 6:44 AM

## 2018-04-08 ENCOUNTER — Inpatient Hospital Stay: Payer: Medicare HMO | Attending: Oncology

## 2018-04-08 ENCOUNTER — Inpatient Hospital Stay (HOSPITAL_BASED_OUTPATIENT_CLINIC_OR_DEPARTMENT_OTHER): Payer: Medicare HMO | Admitting: Oncology

## 2018-04-08 ENCOUNTER — Other Ambulatory Visit: Payer: Self-pay

## 2018-04-08 VITALS — BP 115/61 | HR 66 | Temp 97.7°F | Wt 175.2 lb

## 2018-04-08 DIAGNOSIS — Z992 Dependence on renal dialysis: Secondary | ICD-10-CM | POA: Diagnosis not present

## 2018-04-08 DIAGNOSIS — D649 Anemia, unspecified: Secondary | ICD-10-CM | POA: Diagnosis not present

## 2018-04-08 DIAGNOSIS — D7589 Other specified diseases of blood and blood-forming organs: Secondary | ICD-10-CM | POA: Insufficient documentation

## 2018-04-08 DIAGNOSIS — I120 Hypertensive chronic kidney disease with stage 5 chronic kidney disease or end stage renal disease: Secondary | ICD-10-CM | POA: Diagnosis not present

## 2018-04-08 DIAGNOSIS — N186 End stage renal disease: Secondary | ICD-10-CM | POA: Diagnosis not present

## 2018-04-08 DIAGNOSIS — E1122 Type 2 diabetes mellitus with diabetic chronic kidney disease: Secondary | ICD-10-CM

## 2018-04-08 DIAGNOSIS — D693 Immune thrombocytopenic purpura: Secondary | ICD-10-CM

## 2018-04-08 LAB — CBC WITH DIFFERENTIAL/PLATELET
ABS IMMATURE GRANULOCYTES: 0.02 10*3/uL (ref 0.00–0.07)
BASOS ABS: 0 10*3/uL (ref 0.0–0.1)
Basophils Relative: 1 %
EOS PCT: 7 %
Eosinophils Absolute: 0.4 10*3/uL (ref 0.0–0.5)
HEMATOCRIT: 30.8 % — AB (ref 39.0–52.0)
HEMOGLOBIN: 10.2 g/dL — AB (ref 13.0–17.0)
IMMATURE GRANULOCYTES: 0 %
LYMPHS ABS: 2.1 10*3/uL (ref 0.7–4.0)
LYMPHS PCT: 36 %
MCH: 37.9 pg — ABNORMAL HIGH (ref 26.0–34.0)
MCHC: 33.1 g/dL (ref 30.0–36.0)
MCV: 114.5 fL — ABNORMAL HIGH (ref 80.0–100.0)
Monocytes Absolute: 0.5 10*3/uL (ref 0.1–1.0)
Monocytes Relative: 9 %
NEUTROS ABS: 2.7 10*3/uL (ref 1.7–7.7)
NRBC: 0 % (ref 0.0–0.2)
Neutrophils Relative %: 47 %
Platelets: 98 10*3/uL — ABNORMAL LOW (ref 150–400)
RBC: 2.69 MIL/uL — AB (ref 4.22–5.81)
RDW: 13.4 % (ref 11.5–15.5)
WBC: 5.7 10*3/uL (ref 4.0–10.5)

## 2018-04-08 NOTE — Progress Notes (Signed)
Here for follow up. Per pt " im doing ok" then added that intermitent cramps in hands and legs- not sleeping well at night due to leg cramps.   Frequency- urine -no blood, no pressure feeling,no burning -stated frequency is usual for him

## 2018-04-22 ENCOUNTER — Ambulatory Visit (INDEPENDENT_AMBULATORY_CARE_PROVIDER_SITE_OTHER): Payer: Medicare HMO | Admitting: Vascular Surgery

## 2018-04-22 ENCOUNTER — Ambulatory Visit (INDEPENDENT_AMBULATORY_CARE_PROVIDER_SITE_OTHER): Payer: Medicare HMO

## 2018-04-22 ENCOUNTER — Encounter (INDEPENDENT_AMBULATORY_CARE_PROVIDER_SITE_OTHER): Payer: Self-pay | Admitting: Vascular Surgery

## 2018-04-22 VITALS — BP 108/66 | HR 53 | Resp 17 | Ht 67.0 in | Wt 177.0 lb

## 2018-04-22 DIAGNOSIS — T82858A Stenosis of vascular prosthetic devices, implants and grafts, initial encounter: Secondary | ICD-10-CM

## 2018-04-22 DIAGNOSIS — E1122 Type 2 diabetes mellitus with diabetic chronic kidney disease: Secondary | ICD-10-CM | POA: Diagnosis not present

## 2018-04-22 DIAGNOSIS — T829XXS Unspecified complication of cardiac and vascular prosthetic device, implant and graft, sequela: Secondary | ICD-10-CM

## 2018-04-22 DIAGNOSIS — Z992 Dependence on renal dialysis: Secondary | ICD-10-CM

## 2018-04-22 DIAGNOSIS — E785 Hyperlipidemia, unspecified: Secondary | ICD-10-CM | POA: Diagnosis not present

## 2018-04-22 DIAGNOSIS — N186 End stage renal disease: Secondary | ICD-10-CM | POA: Diagnosis not present

## 2018-04-22 DIAGNOSIS — N185 Chronic kidney disease, stage 5: Secondary | ICD-10-CM

## 2018-04-22 NOTE — Progress Notes (Signed)
MRN : [id number]  Alejandro Armstrong is a 84 y.o. (1929-08-01) male who presents with chief complaint of  Chief Complaint  Patient presents with  . Follow-up    2 week HDA f/u  .  History of Present Illness:   The patient returns to the office for followup of their dialysis access. The function of the access has been stable. The patient denies increased bleeding time or increased recirculation. Patient denies difficulty with cannulation. The patient denies hand pain or other symptoms consistent with steal phenomena.  No significant arm swelling.  The patient denies redness or swelling at the access site. The patient denies fever or chills at home or while on dialysis.  The patient denies amaurosis fugax or recent TIA symptoms. There are no recent neurological changes noted. The patient denies claudication symptoms or rest pain symptoms. The patient denies history of DVT, PE or superficial thrombophlebitis. The patient denies recent episodes of angina or shortness of breath.   Duplex ultrasound of the AV access shows a patent access.  The previously noted stenosis is unchanged compared to last study.       No outpatient medications have been marked as taking for the 04/22/18 encounter (Office Visit) with Delana Meyer, Dolores Lory, MD.    Past Medical History:  Diagnosis Date  . Hyperlipidemia   . Hypertension   . Hypothyroidism   . Idiopathic thrombocytopenia purpura ([place])   . Kidney stones   . Recurrent UTI   . Renal agenesis    discovered at age 20  . Type 2 diabetes mellitus ([place])   . Ureteral stricture     Past Surgical History:  Procedure Laterality Date  . A/V FISTULAGRAM Left 03/31/2017   Procedure: A/V Fistulagram;  Surgeon: [name], MD;  Location: [place] [facility];  Service: Cardiovascular;  Laterality: Left;  . A/V SHUNTOGRAM Left 07/28/2017   Procedure: A/V SHUNTOGRAM;  Surgeon: [name], MD;  Location: [place] [facility];  Service:  Cardiovascular;  Laterality: Left;  . A/V SHUNTOGRAM Left 03/24/2018   Procedure: A/V SHUNTOGRAM;  Surgeon: [name], MD;  Location: [place] [facility];  Service: Cardiovascular;  Laterality: Left;  . APPENDECTOMY    . AV FISTULA PLACEMENT Left 04/16/2016   Procedure: INSERTION OF ARTERIOVENOUS (AV) GORE-TEX GRAFT ARM;  Surgeon: [name], MD;  Location: [facility];  Service: Vascular;  Laterality: Left;  . CATARACT EXTRACTION W/ INTRAOCULAR LENS IMPLANT Bilateral 2014   done 1-2 months apart at [facility].  [name] FINGER SURGERY Left 2002   pinky finger  . KIDNEY STONE SURGERY    . NASAL SINUS SURGERY  1985  . PERIPHERAL VASCULAR CATHETERIZATION  05/13/2016   Procedure: Upper Extremity Angiography;  Surgeon: [name], MD;  Location: [place] [facility];  Service: Cardiovascular;;  . PERIPHERAL VASCULAR CATHETERIZATION  05/13/2016   Procedure: Upper Extremity Intervention;  Surgeon: [name], MD;  Location: [place] [facility];  Service: Cardiovascular;;  . PICC LINE PLACE PERIPHERAL ([place] HX) Right 08/2015  . PICC LINE REMOVAL ([place] HX) Right 09/2015    Social History Social History   Tobacco Use  . Smoking status: Never Smoker  . Smokeless tobacco: Never Used  Substance Use Topics  . Alcohol use: No    Alcohol/week: 0.0 standard drinks  . Drug use: No    Family History Family History  Problem Relation Age of Onset  . Cervical cancer Sister     No Known Allergies  REVIEW OF SYSTEMS (Negative unless checked)  Constitutional: [] Weight loss  [] Fever  [] Chills Cardiac: [] Chest pain   [] Chest pressure   [] Palpitations   [] Shortness of breath when laying flat   [] Shortness of breath with exertion. Vascular:  [] Pain in legs with walking   [] Pain in legs at rest  [] History of DVT   [] Phlebitis   [] Swelling in legs   [] Varicose veins   [] Non-healing ulcers Pulmonary:   [] Uses home oxygen   [] Productive cough   [] Hemoptysis   [] Wheeze  [] COPD    [] Asthma Neurologic:  [] Dizziness   [] Seizures   [] History of stroke   [] History of TIA  [] Aphasia   [] Vissual changes   [] Weakness or numbness in arm   [] Weakness or numbness in leg Musculoskeletal:   [] Joint swelling   [] Joint pain   [] Low back pain Hematologic:  [] Easy bruising  [] Easy bleeding   [] Hypercoagulable state   [] Anemic Gastrointestinal:  [] Diarrhea   [] Vomiting  [] Gastroesophageal reflux/heartburn   [] Difficulty swallowing. Genitourinary:  [] Chronic kidney disease   [] Difficult urination  [] Frequent urination   [] Blood in urine Skin:  [] Rashes   [] Ulcers  Psychological:  [] History of anxiety   []  History of major depression.  Physical Examination  Vitals:   04/22/18 1137  BP: 108/66  Pulse: (!) 53  Resp: 17  Weight: 177 lb (80.3 kg)  Height: 5\' 7"  (1.702 m)   Body mass index is 27.72 kg/m. Gen: WD/WN, NAD Head: [place]/AT, No temporalis wasting.  Ear/Nose/Throat: Hearing grossly intact, nares w/o erythema or drainage Eyes: PER, EOMI, sclera nonicteric.  Neck: Supple, no large masses.   Pulmonary:  Good air movement, no audible wheezing bilaterally, no use of accessory muscles.  Cardiac: RRR, no JVD Vascular:  Left AV access good thrill good bruit Vessel Right Left  Radial Palpable Palpable  Ulnar Palpable Palpable  Brachial Palpable Palpable  Gastrointestinal: Non-distended. No guarding/no peritoneal signs.  Musculoskeletal: M/S 5/5 throughout.  No deformity or atrophy.  Neurologic: CN 2-12 intact. Symmetrical.  Speech is fluent. Motor exam as listed above. Psychiatric: Judgment intact, Mood & affect appropriate for pt's clinical situation. Dermatologic: No rashes or ulcers noted.  No changes consistent with cellulitis. Lymph : No lichenification or skin changes of chronic lymphedema.  CBC Lab Results  Component Value Date   WBC 5.7 04/08/2018   HGB 10.2 (L) 04/08/2018   HCT 30.8 (L) 04/08/2018   MCV 114.5 (H) 04/08/2018   PLT 98 (L) 04/08/2018    BMET     Component Value Date/Time   NA 141 04/02/2016 1131   NA 137 10/15/2012 2113   K 4.7 02/05/2018 1200   K 4.2 10/15/2012 2113   CL 117 (H) 02/05/2018 1200   CL 110 (H) 10/15/2012 2113   CO2 22 04/02/2016 1131   CO2 16 (L) 10/15/2012 2113   GLUCOSE 137 (H) 04/02/2016 1131   GLUCOSE 142 (H) 10/15/2012 2113   BUN 53 (H) 05/07/2016 1419   BUN 51 (H) 10/15/2012 2113   CREATININE 6.10 (H) 05/07/2016 1419   CREATININE 3.24 (H) 10/15/2012 2113   CALCIUM 9.5 04/02/2016 1131   CALCIUM 8.7 10/15/2012 2113   GFRNONAA 8 (L) 05/07/2016 1419   GFRNONAA 17 (L) 10/15/2012 2113   GFRAA 9 (L) 05/07/2016 1419   GFRAA 20 (L) 10/15/2012 2113   CrCl cannot be calculated (Patient's most recent lab result is older than the maximum 21 days allowed.).  COAG Lab Results  Component Value Date   INR 0.94 04/02/2016  INR 1.96 12/17/2015   INR 2.43 12/10/2015    Radiology Vas US Duplex Dialysis Access (avf, Avg)  Result Date: 04/22/2018 DIALYSIS ACCESS Reason for Exam: S/P PTA of mid graft with stent. History: 04/16/16-Left brachial-axillary AVG;          05/13/16-Left brachia & radial artery PTAs for steal;          03/31/17-Outflow vein PTA/stent;          03/24/2018 PTA stricture of mid AVG          07/28/17-Peripheral segment PTA/stent with central venous PTA;. Comparison Study: 03/18/2018 Performing Technologist: Concha Norway RVT  Examination Guidelines: A complete evaluation includes B-mode imaging, spectral Doppler, color Doppler, and power Doppler as needed of all accessible portions of each vessel. Unilateral testing is considered an integral part of a complete examination. Limited examinations for reoccurring indications may be performed as noted.  Findings:  +---------------+----------+-------------+----------+--------+ OUTFLOW VEIN   PSV (cm/s)Diameter (cm)Depth (cm)Describe +---------------+----------+-------------+----------+--------+ Subclavian vein    72                                     +---------------+----------+-------------+----------+--------+  +-------------------+----------+----------------+-----------------------------+ AVG                PSV (cm/s)    Flow Vol              Describe                                             (mL/min)                                  +-------------------+----------+----------------+-----------------------------+ Native artery         139          1243                                    inflow                                                                     +-------------------+----------+----------------+-----------------------------+ Arterial              656                       stenotic and change in     anastomosis                                            diameter            +-------------------+----------+----------------+-----------------------------+ Prox graft            436                                                  +-------------------+----------+----------------+-----------------------------+  Mid graft             124                                stent             +-------------------+----------+----------------+-----------------------------+ Distal graft          206                                stent             +-------------------+----------+----------------+-----------------------------+ Venous anastomosis    169                                                  +-------------------+----------+----------------+-----------------------------+ Venous outflow        132                                                  +-------------------+----------+----------------+-----------------------------+ +----------------+-------------+----------+---------+----------+---------------+                 Diameter (cm)Depth (cm)BranchingPSV (cm/s)  Flow Volume                                                                (ml/min)      +----------------+-------------+----------+---------+----------+---------------+ Left radial                                         35                    artery distal                                                             +----------------+-------------+----------+---------+----------+---------------+ Antegrade                                                                 +----------------+-------------+----------+---------+----------+---------------+  Summary: Patent left BrachAx AVG with stenosis of the arterial anastomosis site. The diameter is .28 and .21cm in the stenotic region. The stents witin the AVG are patent with no evidence of stenosis.  *See table(s) above for measurements and observations.  Diagnosing physician: Hortencia Pilar MD Electronically signed by Hortencia Pilar MD on 04/22/2018 at 12:00:12 PM.    --------------------------------------------------------------------------------   Final      Assessment/Plan 1. Complication of vascular access for dialysis, sequela Recommend:  The patient is doing well and currently has adequate dialysis access. The patient's dialysis center is not reporting any access issues. Flow pattern is stable when compared to the prior ultrasound.  The patient should have a duplex ultrasound of the dialysis access in 3 months. The patient will follow-up with me in the office after each ultrasound   - VAS [place] (AVF, AVG); Future  2. ESRD on dialysis ([place]) Continue HD without interuption  3. Hyperlipidemia, unspecified hyperlipidemia type Continue statin as ordered and reviewed, no changes at this time   4. Type 2 diabetes mellitus with stage 5 chronic kidney disease not on chronic dialysis, without long-term current use of insulin (HCC) Continue hypoglycemic medications as already ordered, these medications have been reviewed and there are no changes at this time.  Hgb A1C to be monitored as  already arranged by primary service     Hortencia Pilar, MD  04/22/2018 9:50 PM

## 2018-05-20 ENCOUNTER — Ambulatory Visit (INDEPENDENT_AMBULATORY_CARE_PROVIDER_SITE_OTHER): Payer: Medicare HMO | Admitting: Vascular Surgery

## 2018-05-20 ENCOUNTER — Encounter (INDEPENDENT_AMBULATORY_CARE_PROVIDER_SITE_OTHER): Payer: Medicare HMO

## 2018-07-08 ENCOUNTER — Inpatient Hospital Stay: Payer: Medicare HMO | Attending: Oncology | Admitting: *Deleted

## 2018-07-08 DIAGNOSIS — D693 Immune thrombocytopenic purpura: Secondary | ICD-10-CM | POA: Diagnosis present

## 2018-07-08 LAB — CBC WITH DIFFERENTIAL/PLATELET
ABS IMMATURE GRANULOCYTES: 0.01 10*3/uL (ref 0.00–0.07)
BASOS ABS: 0 10*3/uL (ref 0.0–0.1)
BASOS PCT: 1 %
Eosinophils Absolute: 0.4 10*3/uL (ref 0.0–0.5)
Eosinophils Relative: 6 %
HCT: 29.9 % — ABNORMAL LOW (ref 39.0–52.0)
Hemoglobin: 10 g/dL — ABNORMAL LOW (ref 13.0–17.0)
IMMATURE GRANULOCYTES: 0 %
Lymphocytes Relative: 29 %
Lymphs Abs: 1.7 10*3/uL (ref 0.7–4.0)
MCH: 39.8 pg — ABNORMAL HIGH (ref 26.0–34.0)
MCHC: 33.4 g/dL (ref 30.0–36.0)
MCV: 119.1 fL — AB (ref 80.0–100.0)
MONOS PCT: 7 %
Monocytes Absolute: 0.4 10*3/uL (ref 0.1–1.0)
NEUTROS ABS: 3.3 10*3/uL (ref 1.7–7.7)
NEUTROS PCT: 57 %
NRBC: 0 % (ref 0.0–0.2)
PLATELETS: 107 10*3/uL — AB (ref 150–400)
RBC: 2.51 MIL/uL — ABNORMAL LOW (ref 4.22–5.81)
RDW: 14.6 % (ref 11.5–15.5)
WBC: 5.8 10*3/uL (ref 4.0–10.5)

## 2018-07-20 ENCOUNTER — Encounter (INDEPENDENT_AMBULATORY_CARE_PROVIDER_SITE_OTHER): Payer: Medicare HMO

## 2018-07-22 ENCOUNTER — Encounter (INDEPENDENT_AMBULATORY_CARE_PROVIDER_SITE_OTHER): Payer: Medicare HMO

## 2018-07-22 ENCOUNTER — Ambulatory Visit (INDEPENDENT_AMBULATORY_CARE_PROVIDER_SITE_OTHER): Payer: Medicare HMO | Admitting: Vascular Surgery

## 2018-09-02 ENCOUNTER — Telehealth (INDEPENDENT_AMBULATORY_CARE_PROVIDER_SITE_OTHER): Payer: Self-pay

## 2018-09-02 ENCOUNTER — Ambulatory Visit (INDEPENDENT_AMBULATORY_CARE_PROVIDER_SITE_OTHER): Payer: Medicare HMO | Admitting: Nurse Practitioner

## 2018-09-02 ENCOUNTER — Encounter (INDEPENDENT_AMBULATORY_CARE_PROVIDER_SITE_OTHER): Payer: Self-pay

## 2018-09-02 ENCOUNTER — Encounter (INDEPENDENT_AMBULATORY_CARE_PROVIDER_SITE_OTHER): Payer: Self-pay | Admitting: Nurse Practitioner

## 2018-09-02 ENCOUNTER — Ambulatory Visit (INDEPENDENT_AMBULATORY_CARE_PROVIDER_SITE_OTHER): Payer: Medicare HMO

## 2018-09-02 ENCOUNTER — Other Ambulatory Visit: Payer: Self-pay

## 2018-09-02 VITALS — BP 132/74 | HR 64 | Resp 16 | Ht 67.0 in | Wt 178.0 lb

## 2018-09-02 DIAGNOSIS — Z992 Dependence on renal dialysis: Secondary | ICD-10-CM | POA: Diagnosis not present

## 2018-09-02 DIAGNOSIS — N185 Chronic kidney disease, stage 5: Secondary | ICD-10-CM

## 2018-09-02 DIAGNOSIS — T829XXS Unspecified complication of cardiac and vascular prosthetic device, implant and graft, sequela: Secondary | ICD-10-CM

## 2018-09-02 DIAGNOSIS — N186 End stage renal disease: Secondary | ICD-10-CM | POA: Diagnosis not present

## 2018-09-02 DIAGNOSIS — T82898A Other specified complication of vascular prosthetic devices, implants and grafts, initial encounter: Secondary | ICD-10-CM

## 2018-09-02 DIAGNOSIS — E1122 Type 2 diabetes mellitus with diabetic chronic kidney disease: Secondary | ICD-10-CM

## 2018-09-02 DIAGNOSIS — Z79899 Other long term (current) drug therapy: Secondary | ICD-10-CM

## 2018-09-02 DIAGNOSIS — E785 Hyperlipidemia, unspecified: Secondary | ICD-10-CM | POA: Diagnosis not present

## 2018-09-02 NOTE — Telephone Encounter (Signed)
Spoke with the patient and let him know about his fistulagram with Dr. Delana Meyer on 09/07/2018 with a 9:00 am arrival time. This information will be mailed out as well.

## 2018-09-02 NOTE — Progress Notes (Signed)
SUBJECTIVE:  Patient ID: Alejandro Armstrong, male    DOB: [birth year], 85 y.o.   MRN: [id number] No chief complaint on file.   HPI  Alejandro Armstrong is a 85 y.o. male The patient returns to the office for follow up regarding problem with the dialysis access. Currently the patient is maintained via a left brachial axillary graft. The patient has had multiple failed upper extremity accesses.  The patient notes a significant increase in bleeding time after decannulation.  The patient has also been informed that there is increased recirculation.    The patient denies hand pain or other symptoms consistent with steal phenomena.  No significant arm swelling.  The patient denies redness or swelling at the access site. The patient denies fever or chills at home or while on dialysis.  The patient denies amaurosis fugax or recent TIA symptoms. There are no recent neurological changes noted. The patient denies claudication symptoms or rest pain symptoms. The patient denies history of DVT, PE or superficial thrombophlebitis. The patient denies recent episodes of angina or shortness of breath.    The HDA showed a flow volume of 807.  There is a hemodynamically significant velocity increase at the arterial anastomosis.     Past Medical History:  Diagnosis Date  . Hyperlipidemia   . Hypertension   . Hypothyroidism   . Idiopathic thrombocytopenia purpura ([place])   . Kidney stones   . Recurrent UTI   . Renal agenesis    discovered at age 61  . Type 2 diabetes mellitus ([place])   . Ureteral stricture     Past Surgical History:  Procedure Laterality Date  . A/V FISTULAGRAM Left 03/31/2017   Procedure: A/V Fistulagram;  Surgeon: [name], MD;  Location: [place] [facility];  Service: Cardiovascular;  Laterality: Left;  . A/V SHUNTOGRAM Left 07/28/2017   Procedure: A/V SHUNTOGRAM;  Surgeon: [name], MD;  Location: [place] [facility];  Service: Cardiovascular;  Laterality:  Left;  . A/V SHUNTOGRAM Left 03/24/2018   Procedure: A/V SHUNTOGRAM;  Surgeon: [name], MD;  Location: [place] [facility];  Service: Cardiovascular;  Laterality: Left;  . APPENDECTOMY    . AV FISTULA PLACEMENT Left 04/16/2016   Procedure: INSERTION OF ARTERIOVENOUS (AV) GORE-TEX GRAFT ARM;  Surgeon: [name], MD;  Location: [facility];  Service: Vascular;  Laterality: Left;  . CATARACT EXTRACTION W/ INTRAOCULAR LENS IMPLANT Bilateral 2014   done 1-2 months apart at [facility].  [name] FINGER SURGERY Left 2002   pinky finger  . KIDNEY STONE SURGERY    . NASAL SINUS SURGERY  1985  . PERIPHERAL VASCULAR CATHETERIZATION  05/13/2016   Procedure: Upper Extremity Angiography;  Surgeon: [name], MD;  Location: [place] [facility];  Service: Cardiovascular;;  . PERIPHERAL VASCULAR CATHETERIZATION  05/13/2016   Procedure: Upper Extremity Intervention;  Surgeon: [name], MD;  Location: [place] [facility];  Service: Cardiovascular;;  . PICC LINE PLACE PERIPHERAL ([place] HX) Right 08/2015  . PICC LINE REMOVAL ([place] HX) Right 09/2015    Social History   Socioeconomic History  . Marital status: Married    Spouse name: Not on file  . Number of children: Not on file  . Years of education: Not on file  . Highest education level: Not on file  Occupational History  . Occupation: retired  Scientific laboratory technician  . Financial resource strain: Not on file  . Food insecurity:    Worry: Not on file  Inability: Not on file  . Transportation needs:    Medical: Not on file    Non-medical: Not on file  Tobacco Use  . Smoking status: Never Smoker  . Smokeless tobacco: Never Used  Substance and Sexual Activity  . Alcohol use: No    Alcohol/week: 0.0 standard drinks  . Drug use: No  . Sexual activity: Never  Lifestyle  . Physical activity:    Days per week: Not on file    Minutes per session: Not on file  . Stress: Not on file  Relationships  . Social connections:    Talks on  phone: Not on file    Gets together: Not on file    Attends religious service: Not on file    Active member of club or organization: Not on file    Attends meetings of clubs or organizations: Not on file    Relationship status: Not on file  . Intimate partner violence:    Fear of current or ex partner: Not on file    Emotionally abused: Not on file    Physically abused: Not on file    Forced sexual activity: Not on file  Other Topics Concern  . Not on file  Social History Narrative  . Not on file    Family History  Problem Relation Age of Onset  . Cervical cancer Sister     No Known Allergies   Review of Systems   Review of Systems: Negative Unless Checked Constitutional: [] Weight loss  [] Fever  [] Chills Cardiac: [] Chest pain   []  Atrial Fibrillation  [] Palpitations   [] Shortness of breath when laying flat   [] Shortness of breath with exertion. [] Shortness of breath at rest Vascular:  [] Pain in legs with walking   [] Pain in legs with standing [] Pain in legs when laying flat   [] Claudication    [] Pain in feet when laying flat    [] History of DVT   [] Phlebitis   [] Swelling in legs   [] Varicose veins   [] Non-healing ulcers Pulmonary:   [] Uses home oxygen   [] Productive cough   [] Hemoptysis   [] Wheeze  [] COPD   [] Asthma Neurologic:  [] Dizziness   [] Seizures  [] Blackouts [] History of stroke   [] History of TIA  [] Aphasia   [] Temporary Blindness   [] Weakness or numbness in arm   [] Weakness or numbness in leg Musculoskeletal:   [] Joint swelling   [] Joint pain   [] Low back pain  []  History of Knee Replacement [] Arthritis [] back Surgeries  []  Spinal Stenosis    Hematologic:  [] Easy bruising  [] Easy bleeding   [] Hypercoagulable state   [x] Anemic Gastrointestinal:  [] Diarrhea   [] Vomiting  [] Gastroesophageal reflux/heartburn   [] Difficulty swallowing. [] Abdominal pain Genitourinary:  [x] Chronic kidney disease   [] Difficult urination  [] Anuric   [] Blood in urine [] Frequent urination  [] Burning  with urination   [] Hematuria Skin:  [] Rashes   [] Ulcers [] Wounds Psychological:  [] History of anxiety   []  History of major depression  []  Memory Difficulties      OBJECTIVE:   Physical Exam  BP 132/74 (BP Location: Right Arm)   Pulse 64   Resp 16   Ht 5\' 7"  (1.702 m)   Wt 178 lb (80.7 kg)   BMI 27.88 kg/m   Gen: WD/WN, NAD Head: [place]/AT, No temporalis wasting.  Ear/Nose/Throat: Hearing grossly intact, nares w/o erythema or drainage Eyes: PER, EOMI, sclera nonicteric.  Neck: Supple, no masses.  No JVD.  Pulmonary:  Good air movement, no use of accessory muscles.  Cardiac: RRR Vascular:  Vessel Right Left  Radial Palpable Palpable   Gastrointestinal: soft, non-distended. No guarding/no peritoneal signs.  Musculoskeletal: M/S 5/5 throughout.  No deformity or atrophy.  Neurologic: Pain and light touch intact in extremities.  Symmetrical.  Speech is fluent. Motor exam as listed above. Psychiatric: Judgment intact, Mood & affect appropriate for pt's clinical situation. Dermatologic: No Venous rashes. No Ulcers Noted.  No changes consistent with cellulitis. Lymph : No Cervical lymphadenopathy, no lichenification or skin changes of chronic lymphedema.       ASSESSMENT AND PLAN:  1. Complication of vascular access for dialysis, sequela Recommend:  The patient is experiencing increasing problems with their dialysis access.  Patient should have a shuntogram of the left lower extremity with the intention for intervention.  The intention for intervention is to restore appropriate flow and prevent thrombosis and possible loss of the access.  As well as improve the quality of dialysis therapy.  The risks, benefits and alternative therapies were reviewed in detail with the patient.  All questions were answered.  The patient agrees to proceed with angio/intervention.      2. Hyperlipidemia, unspecified hyperlipidemia type Continue statin as ordered and reviewed, no changes at this  time   3. Type 2 diabetes mellitus with stage 5 chronic kidney disease not on chronic dialysis, without long-term current use of insulin (HCC) Continue hypoglycemic medications as already ordered, these medications have been reviewed and there are no changes at this time.  Hgb A1C to be monitored as already arranged by primary service    Current Outpatient Medications on File Prior to Visit  Medication Sig Dispense Refill  . ACCU-CHEK AVIVA PLUS test strip     . acetaminophen (TYLENOL) 500 MG tablet Take 1,000 mg by mouth every 6 (six) hours as needed for moderate pain or headache.     [name] amLODipine (NORVASC) 5 MG tablet Take 5 mg by mouth daily.    [name] atorvastatin (LIPITOR) 10 MG tablet Take 10 mg by mouth daily.    . BD PEN NEEDLE NANO U/F 32G X 4 MM MISC     . calcitRIOL (ROCALTROL) 0.25 MCG capsule     . Carboxymethylcellulose Sodium (THERATEARS) 0.25 % SOLN Place 1-2 drops into both eyes 3 (three) times daily as needed (for dry eyes).    . furosemide (LASIX) 40 MG tablet Take 40 mg by mouth 2 (two) times daily. In the morning and in the afternoon    . hydrALAZINE (APRESOLINE) 25 MG tablet Take 25 mg by mouth 2 (two) times daily.    . insulin detemir (LEVEMIR) 100 UNIT/ML injection Inject 20 Units into the skin daily before breakfast.     . levothyroxine (SYNTHROID, LEVOTHROID) 112 MCG tablet Take 112 mcg by mouth daily before breakfast.    . lidocaine-prilocaine (EMLA) cream APPLY TO ACCESS SITE 30 MINUTES PRIOR TO DIALYSIS  11  . lisinopril (PRINIVIL,ZESTRIL) 20 MG tablet Take 20 mg by mouth daily.     . Omega-3 1000 MG CAPS Take 2,000 mg by mouth 2 (two) times daily.      Current Facility-Administered Medications on File Prior to Visit  Medication Dose Route Frequency Provider Last Rate Last Dose  . heparin lock flush 100 unit/mL  500 Units Intravenous Once [name], MD      . sodium chloride flush (NS) 0.9 % injection 10 mL  10 mL Intravenous PRN [name], MD       . sodium chloride flush (NS)  0.9 % injection 10 mL  10 mL Intravenous PRN [name], MD   10 mL at 08/16/15 0857    There are no Patient Instructions on file for this visit. No follow-ups on file.   Kris Hartmann, NP  This note was completed with Sales executive.  Any errors are purely unintentional.

## 2018-09-06 ENCOUNTER — Emergency Department
Admission: EM | Admit: 2018-09-06 | Discharge: 2018-09-07 | Disposition: A | Discharge status: Home / Self Care | Payer: Medicare HMO | Attending: Emergency Medicine | Admitting: Emergency Medicine

## 2018-09-06 ENCOUNTER — Encounter: Payer: Self-pay | Admitting: Emergency Medicine

## 2018-09-06 ENCOUNTER — Other Ambulatory Visit: Payer: Self-pay

## 2018-09-06 DIAGNOSIS — Y832 Surgical operation with anastomosis, bypass or graft as the cause of abnormal reaction of the patient, or of later complication, without mention of misadventure at the time of the procedure: Secondary | ICD-10-CM | POA: Insufficient documentation

## 2018-09-06 DIAGNOSIS — M549 Dorsalgia, unspecified: Secondary | ICD-10-CM | POA: Diagnosis present

## 2018-09-06 DIAGNOSIS — E785 Hyperlipidemia, unspecified: Secondary | ICD-10-CM | POA: Insufficient documentation

## 2018-09-06 DIAGNOSIS — E039 Hypothyroidism, unspecified: Secondary | ICD-10-CM | POA: Insufficient documentation

## 2018-09-06 DIAGNOSIS — X500XXA Overexertion from strenuous movement or load, initial encounter: Secondary | ICD-10-CM | POA: Diagnosis not present

## 2018-09-06 DIAGNOSIS — E1122 Type 2 diabetes mellitus with diabetic chronic kidney disease: Secondary | ICD-10-CM | POA: Insufficient documentation

## 2018-09-06 DIAGNOSIS — Z794 Long term (current) use of insulin: Secondary | ICD-10-CM | POA: Insufficient documentation

## 2018-09-06 DIAGNOSIS — Z79899 Other long term (current) drug therapy: Secondary | ICD-10-CM | POA: Diagnosis not present

## 2018-09-06 DIAGNOSIS — Z992 Dependence on renal dialysis: Secondary | ICD-10-CM | POA: Diagnosis not present

## 2018-09-06 DIAGNOSIS — S39012A Strain of muscle, fascia and tendon of lower back, initial encounter: Secondary | ICD-10-CM | POA: Diagnosis not present

## 2018-09-06 DIAGNOSIS — N186 End stage renal disease: Secondary | ICD-10-CM | POA: Insufficient documentation

## 2018-09-06 DIAGNOSIS — I120 Hypertensive chronic kidney disease with stage 5 chronic kidney disease or end stage renal disease: Secondary | ICD-10-CM | POA: Diagnosis not present

## 2018-09-06 DIAGNOSIS — T82590A Other mechanical complication of surgically created arteriovenous fistula, initial encounter: Secondary | ICD-10-CM | POA: Diagnosis not present

## 2018-09-06 DIAGNOSIS — M545 Low back pain, unspecified: Secondary | ICD-10-CM

## 2018-09-06 DIAGNOSIS — Z79890 Hormone replacement therapy (postmenopausal): Secondary | ICD-10-CM | POA: Diagnosis not present

## 2018-09-06 DIAGNOSIS — T148XXA Other injury of unspecified body region, initial encounter: Secondary | ICD-10-CM

## 2018-09-06 NOTE — ED Triage Notes (Signed)
Pt arrived to the ED via EMS from home for complaints of lower back pain secondary to helping his wife off the floor after she fell. Pt states that it all started 3 days ago and today he could not stand the pain. Pt also reports that he is a dialysis Pt (M,W, F) and that he has an appointment to get a  Shunt placed tomorrow. Pt is AOx4 in no apparent distress.

## 2018-09-06 NOTE — ED Notes (Signed)
Pt's Wife Izora Gala [phone]

## 2018-09-07 ENCOUNTER — Emergency Department: Payer: Medicare HMO

## 2018-09-07 ENCOUNTER — Encounter: Payer: Self-pay | Admitting: *Deleted

## 2018-09-07 ENCOUNTER — Encounter
Admission: EM | Disposition: A | Discharge status: Home / Self Care | Payer: Self-pay | Source: Home / Self Care | Attending: Emergency Medicine

## 2018-09-07 ENCOUNTER — Other Ambulatory Visit (INDEPENDENT_AMBULATORY_CARE_PROVIDER_SITE_OTHER): Payer: Self-pay | Admitting: Nurse Practitioner

## 2018-09-07 ENCOUNTER — Ambulatory Visit
Admission: RE | Admit: 2018-09-07 | Discharge: 2018-09-07 | Disposition: A | Discharge status: Home / Self Care | Payer: Medicare HMO | Source: Ambulatory Visit | Attending: Vascular Surgery | Admitting: Vascular Surgery

## 2018-09-07 ENCOUNTER — Ambulatory Visit: Admission: RE | Admit: 2018-09-07 | Payer: Medicare HMO | Source: Home / Self Care | Admitting: Vascular Surgery

## 2018-09-07 ENCOUNTER — Other Ambulatory Visit: Payer: Self-pay

## 2018-09-07 ENCOUNTER — Encounter
Admission: RE | Disposition: A | Discharge status: Home / Self Care | Payer: Self-pay | Source: Ambulatory Visit | Attending: Vascular Surgery

## 2018-09-07 DIAGNOSIS — Z992 Dependence on renal dialysis: Secondary | ICD-10-CM | POA: Insufficient documentation

## 2018-09-07 DIAGNOSIS — T82858A Stenosis of vascular prosthetic devices, implants and grafts, initial encounter: Secondary | ICD-10-CM

## 2018-09-07 DIAGNOSIS — N186 End stage renal disease: Secondary | ICD-10-CM

## 2018-09-07 HISTORY — PX: A/V FISTULAGRAM: CATH118298

## 2018-09-07 LAB — BASIC METABOLIC PANEL
Anion gap: 13 (ref 5–15)
BUN: 57 mg/dL — ABNORMAL HIGH (ref 8–23)
CO2: 18 mmol/L — ABNORMAL LOW (ref 22–32)
Calcium: 8.8 mg/dL — ABNORMAL LOW (ref 8.9–10.3)
Chloride: 109 mmol/L (ref 98–111)
Creatinine, Ser: 7.58 mg/dL — ABNORMAL HIGH (ref 0.61–1.24)
GFR calc Af Amer: 7 mL/min — ABNORMAL LOW (ref >60)
GFR calc non Af Amer: 6 mL/min — ABNORMAL LOW (ref >60)
Glucose, Bld: 114 mg/dL — ABNORMAL HIGH (ref 70–99)
POTASSIUM: 5.6 mmol/L — AB (ref 3.5–5.1)
Sodium: 140 mmol/L (ref 135–145)

## 2018-09-07 LAB — CBC WITH DIFFERENTIAL/PLATELET
Abs Immature Granulocytes: 0.03 10*3/uL (ref 0.00–0.07)
Basophils Absolute: 0 10*3/uL (ref 0.0–0.1)
Basophils Relative: 0 %
Eosinophils Absolute: 0.5 10*3/uL (ref 0.0–0.5)
Eosinophils Relative: 7 %
HCT: 31.5 % — ABNORMAL LOW (ref 39.0–52.0)
Hemoglobin: 10.6 g/dL — ABNORMAL LOW (ref 13.0–17.0)
Immature Granulocytes: 0 %
Lymphocytes Relative: 26 %
Lymphs Abs: 2.2 10*3/uL (ref 0.7–4.0)
MCH: 41.2 pg — ABNORMAL HIGH (ref 26.0–34.0)
MCHC: 33.7 g/dL (ref 30.0–36.0)
MCV: 122.6 fL — ABNORMAL HIGH (ref 80.0–100.0)
Monocytes Absolute: 0.6 10*3/uL (ref 0.1–1.0)
Monocytes Relative: 7 %
Neutro Abs: 5 10*3/uL (ref 1.7–7.7)
Neutrophils Relative %: 60 %
PLATELETS: 129 10*3/uL — AB (ref 150–400)
RBC: 2.57 MIL/uL — ABNORMAL LOW (ref 4.22–5.81)
RDW: 14.3 % (ref 11.5–15.5)
WBC: 8.4 10*3/uL (ref 4.0–10.5)
nRBC: 0 % (ref 0.0–0.2)

## 2018-09-07 LAB — PROTIME-INR
INR: 1 (ref 0.8–1.2)
Prothrombin Time: 13.1 seconds (ref 11.4–15.2)

## 2018-09-07 LAB — GLUCOSE, CAPILLARY
Glucose-Capillary: 104 mg/dL — ABNORMAL HIGH (ref 70–99)
Glucose-Capillary: 113 mg/dL — ABNORMAL HIGH (ref 70–99)

## 2018-09-07 SURGERY — A/V FISTULAGRAM
Anesthesia: Moderate Sedation | Laterality: Left

## 2018-09-07 MED ORDER — SODIUM CHLORIDE 0.9 % IV SOLN
INTRAVENOUS | Status: DC
  Administered 2018-09-07: 10:00:00 via INTRAVENOUS

## 2018-09-07 MED ORDER — MIDAZOLAM HCL 5 MG/5ML IJ SOLN
INTRAMUSCULAR | Status: AC
  Filled 2018-09-07: qty 5

## 2018-09-07 MED ORDER — MIDAZOLAM HCL 2 MG/ML PO SYRP: 8.0000 mg | ORAL_SOLUTION | Freq: Once | ORAL | Status: DC | PRN

## 2018-09-07 MED ORDER — IOHEXOL 300 MG/ML  SOLN
INTRAMUSCULAR | Status: DC | PRN
  Administered 2018-09-07: 35 mL via INTRAVENOUS

## 2018-09-07 MED ORDER — FAMOTIDINE 20 MG PO TABS: 40.0000 mg | ORAL_TABLET | Freq: Once | ORAL | Status: DC | PRN

## 2018-09-07 MED ORDER — HEPARIN SODIUM (PORCINE) 1000 UNIT/ML IJ SOLN
INTRAMUSCULAR | Status: DC | PRN
  Administered 2018-09-07: 3000 [IU] via INTRAVENOUS

## 2018-09-07 MED ORDER — CEFAZOLIN SODIUM-DEXTROSE 1-4 GM/50ML-% IV SOLN
1.0000 g | Freq: Once | INTRAVENOUS | Status: DC
  Filled 2018-09-07: qty 50

## 2018-09-07 MED ORDER — METHYLPREDNISOLONE SODIUM SUCC 125 MG IJ SOLR: 125.0000 mg | Freq: Once | INTRAMUSCULAR | Status: DC | PRN

## 2018-09-07 MED ORDER — HYDRALAZINE HCL 20 MG/ML IJ SOLN
10.0000 mg | Freq: Once | INTRAMUSCULAR | Status: AC
  Administered 2018-09-07: 10 mg via INTRAVENOUS

## 2018-09-07 MED ORDER — TRAMADOL HCL 50 MG PO TABS: 100.0000 mg | ORAL_TABLET | Freq: Four times a day (QID) | ORAL | 0 refills | Status: DC | PRN

## 2018-09-07 MED ORDER — HEPARIN (PORCINE) IN NACL 1000-0.9 UT/500ML-% IV SOLN
INTRAVENOUS | Status: AC
  Filled 2018-09-07: qty 1000

## 2018-09-07 MED ORDER — HYDROCODONE-ACETAMINOPHEN 5-325 MG PO TABS
2.0000 | ORAL_TABLET | Freq: Once | ORAL | Status: AC
  Administered 2018-09-07: 2 via ORAL
  Filled 2018-09-07: qty 2

## 2018-09-07 MED ORDER — DIPHENHYDRAMINE HCL 50 MG/ML IJ SOLN: 50.0000 mg | Freq: Once | INTRAMUSCULAR | Status: DC | PRN

## 2018-09-07 MED ORDER — LIDOCAINE HCL (PF) 1 % IJ SOLN
INTRAMUSCULAR | Status: AC
  Filled 2018-09-07: qty 30

## 2018-09-07 MED ORDER — HYDROMORPHONE HCL 1 MG/ML IJ SOLN: 1.0000 mg | Freq: Once | INTRAMUSCULAR | Status: DC | PRN

## 2018-09-07 MED ORDER — LIDOCAINE 5 % EX PTCH
1.0000 | MEDICATED_PATCH | CUTANEOUS | Status: DC
  Administered 2018-09-07: 1 via TRANSDERMAL
  Filled 2018-09-07: qty 1

## 2018-09-07 MED ORDER — HYDRALAZINE HCL 20 MG/ML IJ SOLN
INTRAMUSCULAR | Status: AC
  Administered 2018-09-07: 10 mg via INTRAVENOUS
  Filled 2018-09-07: qty 1

## 2018-09-07 MED ORDER — ONDANSETRON HCL 4 MG/2ML IJ SOLN: 4.0000 mg | Freq: Four times a day (QID) | INTRAMUSCULAR | Status: DC | PRN

## 2018-09-07 MED ORDER — HEPARIN SODIUM (PORCINE) 1000 UNIT/ML IJ SOLN
INTRAMUSCULAR | Status: AC
  Filled 2018-09-07: qty 1

## 2018-09-07 MED ORDER — FENTANYL CITRATE (PF) 100 MCG/2ML IJ SOLN
INTRAMUSCULAR | Status: DC | PRN
  Administered 2018-09-07: 50 ug via INTRAVENOUS

## 2018-09-07 MED ORDER — CEFAZOLIN SODIUM-DEXTROSE 1-4 GM/50ML-% IV SOLN
1.0000 g | Freq: Once | INTRAVENOUS | Status: AC
  Administered 2018-09-07: 1 g via INTRAVENOUS

## 2018-09-07 MED ORDER — FENTANYL CITRATE (PF) 100 MCG/2ML IJ SOLN
INTRAMUSCULAR | Status: AC
  Filled 2018-09-07: qty 2

## 2018-09-07 MED ORDER — MIDAZOLAM HCL 2 MG/2ML IJ SOLN
INTRAMUSCULAR | Status: DC | PRN
  Administered 2018-09-07: 1 mg via INTRAVENOUS

## 2018-09-07 MED ORDER — SODIUM CHLORIDE 0.9 % IV SOLN: INTRAVENOUS | Status: DC

## 2018-09-07 SURGICAL SUPPLY — 15 items
BALLN DORADO 7X60X80 (BALLOONS) ×3
BALLN DORADO 8X60X80 (BALLOONS) ×3
BALLOON DORADO 7X60X80 (BALLOONS) ×1 IMPLANT
BALLOON DORADO 8X60X80 (BALLOONS) ×1 IMPLANT
CATH BEACON 5 .035 65 KMP TIP (CATHETERS) ×3 IMPLANT
DEVICE PRESTO INFLATION (MISCELLANEOUS) ×3 IMPLANT
DRAPE BRACHIAL (DRAPES) ×3 IMPLANT
NEEDLE ENTRY 21GA 7CM ECHOTIP (NEEDLE) ×3 IMPLANT
PACK ANGIOGRAPHY (CUSTOM PROCEDURE TRAY) ×3 IMPLANT
SET INTRO CAPELLA COAXIAL (SET/KITS/TRAYS/PACK) ×3 IMPLANT
SHEATH BRITE TIP 6FRX5.5 (SHEATH) ×3 IMPLANT
SHEATH BRITE TIP 7FRX5.5 (SHEATH) ×3 IMPLANT
STENT COVERA FLARED 9X60X80 (Permanent Stent) ×3 IMPLANT
SUT MNCRL AB 4-0 PS2 18 (SUTURE) ×3 IMPLANT
WIRE MAGIC TORQUE 260C (WIRE) ×3 IMPLANT

## 2018-09-07 NOTE — H&P (Signed)
[facility] History & Physical Update  The patient was interviewed and re-examined.  The patient's previous History and Physical has been reviewed and is unchanged.  There is no change in the plan of care. We plan to proceed with the scheduled procedure.  Hortencia Pilar, MD  09/07/2018, 9:42 AM

## 2018-09-07 NOTE — ED Notes (Signed)
Pt taken to specials room by this RN via wheelchair.

## 2018-09-07 NOTE — ED Notes (Signed)
Pt has a procedure in this hospital in about 3hrs from now. The only person that can pick him up is his wife and she is elderly and can not drive at night. MD states that provided there is available space the Pt can stay in the ED until he goes to his procedure.

## 2018-09-07 NOTE — ED Notes (Signed)
Pt to be taken to [facility] Room #3 for dialysis fistula placement. Report to Post, Therapist, sports. Requesting to leave PIV in place for procedure.

## 2018-09-07 NOTE — Op Note (Signed)
OPERATIVE NOTE   PROCEDURE: 1. Contrast injection left arm brachial axillary AV access 2. Percutaneous transluminal angioplasty peripheral segment to 8 mm with a Dorado balloon 3. Percutaneous transluminal angioplasty and stent placement central venous segment with a 9 mm x 60 mm flared Covera  PRE-OPERATIVE DIAGNOSIS: Complication of dialysis access                                                       End Stage Renal Disease  POST-OPERATIVE DIAGNOSIS: same as above   SURGEON: [name], M.D.  ANESTHESIA: Conscious sedation was administered under my direct supervision by the interventional radiology RN. IV Versed plus fentanyl were utilized. Continuous ECG, pulse oximetry and blood pressure was monitored throughout the entire procedure.  Conscious sedation was for a total of 35.  ESTIMATED BLOOD LOSS: minimal  FINDING(S): Stricture of the AV graft within the peripheral segment as well as a stricture within the central venous portion  SPECIMEN(S):  None  CONTRAST: 35 cc  FLUOROSCOPY TIME: 3.4 minutes  INDICATIONS: Alejandro Armstrong is a 85 y.o. male who  presents with malfunctioning left arm AV access.  The patient is scheduled for angiography with possible intervention of the AV access.  The patient is aware the risks include but are not limited to: bleeding, infection, thrombosis of the cannulated access, and possible anaphylactic reaction to the contrast.  The patient acknowledges if the access can not be salvaged a tunneled catheter will be needed and will be placed during this procedure.  The patient is aware of the risks of the procedure and elects to proceed with the angiogram and intervention.  DESCRIPTION: After full informed written consent was obtained, the patient was brought back to the Special Procedure suite and placed supine position.  Appropriate cardiopulmonary monitors were placed.  The left arm was prepped and draped in the standard fashion.  Appropriate  timeout is called. The left brachial axillary AV graft was cannulated with a micropuncture needle.  Cannulation was performed with ultrasound guidance. Ultrasound was placed in a sterile sleeve, the AV access was interrogated and noted to be echolucent and compressible indicating patency. Image was recorded for the permanent record. The puncture is performed under continuous ultrasound visualization.   The microwire was advanced and the needle was exchanged for  a microsheath.  The J-wire was then advanced and a 6 Fr sheath inserted.  Hand injections were completed to image the access from the arterial anastomosis through the entire access.  The central venous structures were also imaged by hand injections.  Images showed a greater than 80% stenosis in the midportion of the graft in a previously stented segment.  There is also a large amount of retrograde filling of the cephalic vein and other venous structures suggesting a more central stenosis and significant venous hypertension within the left arm.  On imaging of the central veins this is confirmed with a greater than 95% stenosis of the proximal subclavian and distal innominate vein.  The mid and proximal innominate vein and superior vena cava are widely patent.  Based on the images,  3000 units of heparin was given and a wire was negotiated through the strictures within the venous portion of the graft as well as the central stenosis.  Using a Kumpe catheter the Magic torque wire was negotiated into  the inferior vena cava to allow adequate purchase.  The sheath was then upsized to a 7 [place] sheath.  An 7 mm x 60 mm Dorado balloon was used to dilate the left distal innominate and proximal subclavian veins.  Inflation was to 20 atm for approximately 1 minute.  Follow-up imaging demonstrated greater than 50% residual stenosis and and a 9 mm x 60 mm flared Covera was advanced across the lesion and deployed.  It was then postdilated with an 8 mm x 60 mm Dorado  balloon inflated to 20 atm.  Follow-up imaging demonstrated less than 5% residual stenosis.  The detector was then repositioned over the peripheral portion of the AV access and 8 mm x 60 mm Dorado balloon was used to treat the stricture within the AV access.  This was right in the midportion of the graft within a previously placed stent and inflation was to 20 atm for 1 minutes.  Follow-up imaging demonstrated less than 5% residual stenosis.  Follow-up imaging demonstrates significant improvement with a marked reduction of the strictures.  There is now rapid flow of contrast through the graft and the central veins and there is no longer retrograde filling of the cephalic vein and multiple other veins seen on the initial image.   A 4-0 Monocryl purse-string suture was sewn around the sheath.  The sheath was removed and light pressure was applied.  A sterile bandage was applied to the puncture site.    COMPLICATIONS: None  CONDITION: Alejandro Armstrong, M.D [facility] Office: [phone]  09/07/2018 10:49 AM

## 2018-09-07 NOTE — ED Provider Notes (Signed)
[facility] Emergency Department Provider Note  ____________________________________________   First MD Initiated Contact with Patient 09/06/18 2323     (approximate)  I have reviewed the triage vital signs and the nursing notes.   HISTORY  Chief Complaint Back Pain    HPI Alejandro Armstrong is a 85 y.o. male with extensive past medical history as listed below which notably includes end-stage renal disease on dialysis Mondays, Wednesdays, and Fridays who presents by EMS for evaluation of approximately 3 days of back pain.  Of note, he has an appointment with vascular surgery tomorrow morning at 9:00 AM to have a new shunt placed for dialysis.  He presents tonight because he says that he was helping his wife up off the floor 3 or 4 days ago and thinks that he hurt his back.  He has had persistent and gradually worsening pain all across the lower part of his back just above his hips to the point that it is severe tonight and he felt like he could not turn over once he got in bed.  The pain is aching and sharp and severe.  It does not seem to radiate into his legs.  He denies any weakness in his legs but the pain makes it difficult for him to move and walk.  He says that he has been constipated for a couple of days but is having no issues with urination, either retention nor incontinence.  He denies any sensation of numbness or tingling, just the pain.  He says he has had trouble with a "slipped disc" in the past and it feels somewhat like that.  He did not feel a pop or a snap when the injury originally occurred.  He denies any respiratory symptoms.  He denies chest pain, nausea, vomiting, and abdominal pain.  He has no neck pain or issues in his arms.  Moving makes the pain worse and nothing in particular makes it better.         Past Medical History:  Diagnosis Date   Hyperlipidemia    Hypertension    Hypothyroidism    Idiopathic thrombocytopenia purpura  (HCC)    Kidney stones    Recurrent UTI    Renal agenesis    discovered at age 77   Type 2 diabetes mellitus ([place])    Ureteral stricture     Patient Active Problem List   Diagnosis Date Noted   Complication of vascular access for dialysis 11/20/2017   Hyperlipidemia 03/26/2017   ESRD on dialysis ([place]) 03/26/2017   Postop check 04/30/2016   Fever chills 09/18/2015   Bacteremia 09/18/2015   Acute on chronic renal insufficiency 09/01/2015   Hyperkalemia 09/01/2015   MRSA bacteremia secondary to PICC line infection 09/01/2015   Type 2 diabetes mellitus ([place]) 09/01/2015   UTI (lower urinary tract infection) 08/06/2015   Idiopathic thrombocytopenic purpura ([place]) 08/02/2015   B12 deficiency 07/24/2015    Past Surgical History:  Procedure Laterality Date   A/V FISTULAGRAM Left 03/31/2017   Procedure: A/V Fistulagram;  Surgeon: [name], MD;  Location: [place] [facility];  Service: Cardiovascular;  Laterality: Left;   A/V SHUNTOGRAM Left 07/28/2017   Procedure: A/V SHUNTOGRAM;  Surgeon: [name], MD;  Location: [place] [facility];  Service: Cardiovascular;  Laterality: Left;   A/V SHUNTOGRAM Left 03/24/2018   Procedure: A/V SHUNTOGRAM;  Surgeon: [name], MD;  Location: [place] [facility];  Service: Cardiovascular;  Laterality: Left;   APPENDECTOMY  AV FISTULA PLACEMENT Left 04/16/2016   Procedure: INSERTION OF ARTERIOVENOUS (AV) GORE-TEX GRAFT ARM;  Surgeon: [name], MD;  Location: [facility];  Service: Vascular;  Laterality: Left;   CATARACT EXTRACTION W/ INTRAOCULAR LENS IMPLANT Bilateral 2014   done 1-2 months apart at [facility].   FINGER SURGERY Left 2002   pinky finger   KIDNEY STONE SURGERY     NASAL SINUS SURGERY  1985   PERIPHERAL VASCULAR CATHETERIZATION  05/13/2016   Procedure: Upper Extremity Angiography;  Surgeon: [name], MD;  Location: [place] [facility];  Service: Cardiovascular;;    PERIPHERAL VASCULAR CATHETERIZATION  05/13/2016   Procedure: Upper Extremity Intervention;  Surgeon: [name], MD;  Location: [place] [facility];  Service: Cardiovascular;;   PICC LINE PLACE PERIPHERAL ([place] HX) Right 08/2015   PICC LINE REMOVAL ([place] HX) Right 09/2015    Prior to Admission medications   Medication Sig Start Date End Date Taking? Authorizing Provider  ACCU-CHEK AVIVA PLUS test strip 1 each by Other route as directed.  09/28/17   [provider]  acetaminophen (TYLENOL) 500 MG tablet Take 1,000 mg by mouth every 6 (six) hours as needed for moderate pain or headache.     [provider]  amLODipine (NORVASC) 5 MG tablet Take 5 mg by mouth daily.    [provider]  atorvastatin (LIPITOR) 10 MG tablet Take 10 mg by mouth daily.    [provider]  B Complex-C-Folic Acid (RENA-VITE PO) Take 1 tablet by mouth daily.    [provider]  BD PEN NEEDLE NANO U/F 32G X 4 MM MISC  09/28/17   [provider]  Carboxymethylcellulose Sodium (THERATEARS) 0.25 % SOLN Place 1-2 drops into both eyes 3 (three) times daily as needed (for dry eyes).    [provider]  furosemide (LASIX) 40 MG tablet Take 40 mg by mouth 2 (two) times daily. In the morning and in the afternoon    [provider]  hydrALAZINE (APRESOLINE) 25 MG tablet Take 25 mg by mouth 2 (two) times daily.    [provider]  insulin detemir (LEVEMIR) 100 UNIT/ML injection Inject 20 Units into the skin daily before breakfast.     [provider]  levothyroxine (SYNTHROID, LEVOTHROID) 112 MCG tablet Take 112 mcg by mouth daily before breakfast.    [provider]  lidocaine-prilocaine (EMLA) cream Apply 1 application topically daily as needed (port access).  10/03/16   [provider]  lisinopril (PRINIVIL,ZESTRIL) 20 MG tablet Take 20 mg by mouth daily.  06/24/12   [provider]  Omega-3 1000 MG CAPS Take  2,000 mg by mouth 2 (two) times daily.     [provider]  traMADol (ULTRAM) 50 MG tablet Take 2 tablets (100 mg total) by mouth every 6 (six) hours as needed for moderate pain or severe pain. 09/07/18   Hinda Kehr, MD    Allergies Patient has no known allergies.  Family History  Problem Relation Age of Onset   Cervical cancer Sister     Social History Social History   Tobacco Use   Smoking status: Never Smoker   Smokeless tobacco: Never Used  Substance Use Topics   Alcohol use: No    Alcohol/week: 0.0 standard drinks   Drug use: No    Review of Systems Constitutional: No fever/chills Eyes: No visual changes. ENT: No sore throat. Cardiovascular: Denies chest pain. Respiratory: Denies shortness of breath. Gastrointestinal: Constipation for a  couple of days.  No abdominal pain.  No nausea, no vomiting.  No diarrhea.   Genitourinary: Negative for dysuria. Musculoskeletal: Severe bilateral lower back pain that is nonradiating as described above. Integumentary: Negative for rash. Neurological: Negative for headaches, focal weakness or numbness.   ____________________________________________   PHYSICAL EXAM:  VITAL SIGNS: ED Triage Vitals  Enc Vitals Group     BP 09/06/18 2320 (!) 150/74     Pulse Rate 09/06/18 2320 62     Resp 09/06/18 2320 18     Temp 09/06/18 2320 98.1 F (36.7 C)     Temp Source 09/06/18 2320 Oral     SpO2 09/06/18 2320 97 %     Weight 09/06/18 2321 81 kg (178 lb 9.2 oz)     Height 09/06/18 2321 1.702 m (5\' 7" )     Head Circumference --      Peak Flow --      Pain Score 09/06/18 2321 8     Pain Loc --      Pain Edu? --      Excl. in [place]? --     Constitutional: Alert and oriented. Well appearing and in no acute distress. Eyes: Conjunctivae are normal.  Head: Atraumatic. Nose: No congestion/rhinnorhea. Mouth/Throat: Mucous membranes are moist. Neck: No stridor.  No meningeal signs.   Cardiovascular: Normal rate, regular  rhythm. Good peripheral circulation. Grossly normal heart sounds. Respiratory: Normal respiratory effort.  No retractions. Lungs CTAB. Gastrointestinal: Soft and nontender. No distention.  Musculoskeletal: Tenderness to palpation of the paraspinal muscles in the lumbar spine bilaterally with no palpable deformities of the spine itself.  There is no point tenderness within the lumbar spine.  No evidence of any rash or erythema to suggest an infectious process.  No reproducible pain with bilateral straight leg raise.   Neurologic:  Normal speech and language. No gross focal neurologic deficits are appreciated including no weakness in bilateral lower extremities. Skin:  Skin is warm, dry and intact. No rash noted. Psychiatric: Mood and affect are normal. Speech and behavior are normal.  ____________________________________________   LABS (all labs ordered are listed, but only abnormal results are displayed)  Labs Reviewed  CBC WITH DIFFERENTIAL/PLATELET - Abnormal; Notable for the following components:      Result Value   RBC 2.57 (*)    Hemoglobin 10.6 (*)    HCT 31.5 (*)    MCV 122.6 (*)    MCH 41.2 (*)    Platelets 129 (*)    All other components within normal limits  BASIC METABOLIC PANEL - Abnormal; Notable for the following components:   Potassium 5.6 (*)    CO2 18 (*)    Glucose, Bld 114 (*)    BUN 57 (*)    Creatinine, Ser 7.58 (*)    Calcium 8.8 (*)    GFR calc non Af Amer 6 (*)    GFR calc Af Amer 7 (*)    All other components within normal limits  PROTIME-INR   ____________________________________________  EKG  No indication for EKG ____________________________________________  RADIOLOGY   ED MD interpretation: Chronic changes but no acute findings or emergent medical condition identified on the lumbar spine MRI.  Official radiology report(s): Mr Lumbar Spine Wo Contrast  Result Date: 09/07/2018 CLINICAL DATA:  Back pain for 3 days EXAM: MRI LUMBAR SPINE  WITHOUT CONTRAST TECHNIQUE: Multiplanar, multisequence MR imaging of the lumbar spine was performed. No intravenous contrast was administered. COMPARISON:  CT abdomen pelvis 08/06/2015 FINDINGS: Segmentation:  Normal. The lowest disc space is considered to be L5-S1. Alignment:  Normal Vertebrae: Chronic compression deformity of L1 is unchanged. There is mild compression deformity at the L3 superior endplate with less than 20% height loss. Minimal edema. Conus medullaris and cauda equina: The conus medullaris terminates at the L1 level. The cauda equina and conus medullaris are both normal. Paraspinal and other soft tissues: Severely atrophic native kidneys. Disc levels: Sagittal plane imaging includes the T11-12 disc level through the upper sacrum, with axial imaging of the T12-L1 to L5-S1 disc levels. T12-L1: No disc herniation or stenosis. L1-2: Disc desiccation without spinal canal or neural foraminal stenosis. Normal facets. L2-3: Mild disc bulge without spinal canal or neural foraminal stenosis. Normal facets. L3-4: Diffuse mild disc bulge with narrowing of the lateral recesses but no central spinal canal stenosis. No neural foraminal stenosis. L4-5: Disc bulge with mild facet hypertrophy. No central spinal canal stenosis. Right-greater-than-left lateral recess narrowing. No neural foraminal stenosis. L5-S1: Small right subarticular disc protrusion with annular fissure in close proximity to the right S1 nerve root. No central spinal canal or neural foraminal stenosis. The visualized portion of the sacrum is normal. IMPRESSION: 1. Age indeterminate compression deformity of L3 with mild height loss and minimal edema. 2. L5-S1 right subarticular disc protrusion with annular fissure in close proximity to the right S1 nerve root, which could be a source of radicular pain. 3. Multilevel mild degenerative disc disease with lateral recess narrowing at L3-4 and L4-5, but no central spinal canal stenosis or neural  impingement. Electronically Signed   By: Ulyses Jarred M.D.   On: 09/07/2018 04:06    ____________________________________________   PROCEDURES   Procedure(s) performed (including Critical Care):  Procedures   ____________________________________________   INITIAL IMPRESSION / MDM / [place] / ED COURSE  As part of my medical decision making, I reviewed the following data within the [place] notes reviewed and incorporated, Old chart reviewed, Notes from prior ED visits and [place] Controlled Substance Database         Differential diagnosis includes, but is not limited to, musculoskeletal strain, herniated disc, cauda equina syndrome, infectious spine or paraspinous process.  The patient's vital signs are stable and within normal limits.  He looks uncomfortable but is in no distress.  There is no sign of an infectious process such as herpes zoster that could be explaining the pain.  The pain started acutely after he lifted his wife off the floor 3 or 4 days ago.  Cauda equina syndrome is possible given that he is reporting some constipation but this could be simply secondary to the pain or completely unrelated.  I talked with him about his age, being in the hospital now during the COVID-19 pandemic, the fact that he has an appointment with vascular surgery in the morning which he really needs, etc.  We decided together that we would try conservative measures including 2 Norco and a Lidoderm patch.  If he is feeling better and able to ambulate he is comfortable with the plan to go home and follow-up with his appointment in the morning as planned.  If he is still unable to move or walk adequately after pain management, we will investigate further, likely with MRI of the lumbar spine.  I am trying to balance the low possibility of an emergent medical condition with the very real risk that an 85 year old man has being in the emergency department/hospital  environment during this pandemic.  He  understands and I will reassess him after he gets the Lidoderm patch and 2 Norco.  Clinical Course as of Sep 07 626  Tue Sep 07, 2018  0141 I checked on the patient and he said that he feels much better.  However, for man that is normally ambulatory on his own without difficulties, he was unable to sit up to a seated position and completely unable to even move his legs off of the bed.  I have ordered an MR lumbar spine without contrast to evaluate for cauda equina syndrome or other acute neurological emergencies of the lumbar spine.  He agrees with the plan.  Given that he may require admission for PT/OT if he is still unable to move or walk adequately even after a reassuring MRI, I will have an IV placed and check some basic lab work to facilitate admission if it becomes necessary.  This will also help determine if he has any other acute medical issues particularly given that he is a dialysis patient, such as acute electrolyte or other metabolic abnormalities.   [CF]  0232 CBC reassuring  CBC with Differential/Platelet(!) [CF]  8469 Very slight potassium elevation in the setting of chronic kidney failure on hemodialsis  Potassium(!): 5.6 [CF]  0436 The patient was able to get up and go to the bathroom even though it is uncomfortable to do so.  He states he urinated and had a bowel movement which is reassuring.  I explained to him that the MRI results are generally reassuring and although he has some chronic issues there does not seem to be an acute or emergent issue tonight.  He is comfortable with the plan to go home and follow-up in the morning with his vascular surgeon and I am giving him the name and number of a neurosurgeon with whom he can follow-up for his back issues.  I am giving him a short course of tramadol and I gave my usual customary return precautions.  He understands and agrees with the plan.   [CF]  [phone] The patient is comfortable with the plan to  go home and to follow-up with vascular surgery this morning, but he has no ride and his procedure is here at this hospital and 9:00 AM.  Will allow him to stay here rather than to have his equally elderly wife get up and drive and to pick him up only to bring him back a few hours later.  We are keeping him n.p.o.   [CF]    Clinical Course User Index [CF] Hinda Kehr, MD    ____________________________________________  FINAL CLINICAL IMPRESSION(S) / ED DIAGNOSES  Final diagnoses:  Acute bilateral low back pain without sciatica  Muscle strain     MEDICATIONS GIVEN DURING THIS VISIT:  Medications  lidocaine (LIDODERM) 5 % 1 patch (1 patch Transdermal Patch Applied 09/07/18 0021)  HYDROcodone-acetaminophen (NORCO/VICODIN) 5-325 MG per tablet 2 tablet (2 tablets Oral Given 09/07/18 0020)     ED Discharge Orders         Ordered    traMADol (ULTRAM) 50 MG tablet  Every 6 hours PRN     09/07/18 0438           Note:  This document was prepared using Dragon voice recognition software and may include unintentional dictation errors.   Hinda Kehr, MD 09/07/18 [id number]

## 2018-09-07 NOTE — Discharge Instructions (Signed)
You have been seen in the Emergency Department (ED)  today for back pain.  Your workup and exam have not shown any acute abnormalities and you are likely suffering from muscle strain or possible problems with your discs, but there is no treatment that will fix your symptoms at this time.  Please take Tylenol (acetaminophen) 1000 mg every 6 hours.  Take Tramadol as prescribed for severe pain. Do not drink alcohol, drive or participate in any other potentially dangerous activities while taking this medication as it may make you sleepy. Do not take this medication with any other sedating medications, either prescription or over-the-counter. If you were prescribed Percocet or Vicodin, do not take these with acetaminophen (Tylenol) as it is already contained within these medications.   This medication is an opiate (or narcotic) pain medication and can be habit forming.  Use it as little as possible to achieve adequate pain control.  Do not use or use it with extreme caution if you have a history of opiate abuse or dependence.  If you are on a pain contract with your primary care doctor or a pain specialist, be sure to let them know you were prescribed this medication today from the [facility] Emergency Department.  This medication is intended for your use only - do not give any to anyone else and keep it in a secure place where nobody else, especially children, have access to it.  It will also cause or worsen constipation, so you may want to consider taking an over-the-counter stool softener while you are taking this medication.  Please follow up with your doctor as soon as possible regarding today's ED visit and your back pain.  Return to the ED for worsening back pain, fever, weakness or numbness of either leg, or if you develop either (1) an inability to urinate or have bowel movements, or (2) loss of your ability to control your bathroom functions (if you start having "accidents"), or if you develop  other new symptoms that concern you.

## 2018-09-09 ENCOUNTER — Ambulatory Visit
Admission: RE | Admit: 2018-09-09 | Discharge: 2018-09-09 | Disposition: A | Discharge status: Home / Self Care | Payer: Medicare HMO | Source: Ambulatory Visit | Attending: Internal Medicine | Admitting: Internal Medicine

## 2018-09-09 ENCOUNTER — Other Ambulatory Visit: Payer: Self-pay | Admitting: Internal Medicine

## 2018-09-09 ENCOUNTER — Ambulatory Visit
Admission: RE | Admit: 2018-09-09 | Discharge: 2018-09-09 | Disposition: A | Discharge status: Home / Self Care | Payer: Medicare HMO | Attending: Internal Medicine | Admitting: Internal Medicine

## 2018-09-09 ENCOUNTER — Other Ambulatory Visit: Payer: Self-pay

## 2018-09-09 DIAGNOSIS — M545 Low back pain, unspecified: Secondary | ICD-10-CM

## 2018-09-15 ENCOUNTER — Encounter
Admission: RE | Admit: 2018-09-15 | Discharge: 2018-09-15 | Disposition: A | Discharge status: Home / Self Care | Payer: Medicare HMO | Source: Ambulatory Visit | Attending: Orthopedic Surgery | Admitting: Orthopedic Surgery

## 2018-09-15 ENCOUNTER — Other Ambulatory Visit: Payer: Self-pay

## 2018-09-15 HISTORY — DX: Dependence on renal dialysis: Z99.2

## 2018-09-15 HISTORY — DX: Unspecified osteoarthritis, unspecified site: M19.90

## 2018-09-15 MED ORDER — CEFAZOLIN SODIUM-DEXTROSE 2-4 GM/100ML-% IV SOLN
2.0000 g | Freq: Once | INTRAVENOUS | Status: AC
  Administered 2018-09-16: 09:00:00 2 g via INTRAVENOUS

## 2018-09-15 NOTE — Patient Instructions (Signed)
Your procedure is scheduled on: 09/16/2018 Thurs Report to Same Day Surgery 2nd floor medical mall [facility] Entrance-take elevator on left to 2nd floor.  Check in with surgery information desk.) To find out your arrival time please call [phone] between 1PM - 3PM on 09/15/2018  Remember: Instructions that are not followed completely may result in serious medical risk, up to and including death, or upon the discretion of your surgeon and anesthesiologist your surgery may need to be rescheduled.    _x___ 1. Do not eat food after midnight the night before your procedure. You may drink clear liquids up to 2 hours before you are scheduled to arrive at the hospital for your procedure.  Do not drink clear liquids within 2 hours of your scheduled arrival to the hospital.  Clear liquids include  --Water or Apple juice without pulp  --Clear carbohydrate beverage such as ClearFast or Gatorade  --Black Coffee or Clear Tea (No milk, no creamers, do not add anything to                  the coffee or Tea Type 1 and type 2 diabetics should only drink water.   ____Ensure clear carbohydrate drink on the way to the hospital for bariatric patients  ____Ensure clear carbohydrate drink 3 hours before surgery for Dr Dwyane Luo patients if physician instructed.   No gum chewing or hard candies.     __x__ 2. No Alcohol for 24 hours before or after surgery.   __x__3. No Smoking or e-cigarettes for 24 prior to surgery.  Do not use any chewable tobacco products for at least 6 hour prior to surgery   ____  4. Bring all medications with you on the day of surgery if instructed.    __x__ 5. Notify your doctor if there is any change in your medical condition     (cold, fever, infections).    x___6. On the morning of surgery brush your teeth with toothpaste and water.  You may rinse your mouth with mouth wash if you wish.  Do not swallow any toothpaste or mouthwash.   Do not wear jewelry, make-up, hairpins,  clips or nail polish.  Do not wear lotions, powders, or perfumes. You may wear deodorant.  Do not shave 48 hours prior to surgery. Men may shave face and neck.  Do not bring valuables to the hospital.    [facility] is not responsible for any belongings or valuables.               Contacts, dentures or bridgework may not be worn into surgery.  Leave your suitcase in the car. After surgery it may be brought to your room.  For patients admitted to the hospital, discharge time is determined by your                       treatment team.  _  Patients discharged the day of surgery will not be allowed to drive home.  You will need someone to drive you home and stay with you the night of your procedure.    Please read over the following fact sheets that you were given:   [facility] Preparing for Surgery and or MRSA Information   _x___ Take anti-hypertensive listed below, cardiac, seizure, asthma,     anti-reflux and psychiatric medicines. These include:  1. amLODipine (NORVASC)  2.atorvastatin (LIPITOR)  3.hydrALAZINE (APRESOLINE)   4.levothyroxine (SYNTHROID, LEVOTHROID)  5.  6.  ____Fleets enema  or Magnesium Citrate as directed.   _x___ Use CHG Soap or sage wipes as directed on instruction sheet   ____ Use inhalers on the day of surgery and bring to hospital day of surgery  ____ Stop Metformin and Janumet 2 days prior to surgery.    _x___ Take 1/2 of usual insulin dose the night before surgery and none on the morning     surgery.   _x___ Follow recommendations from Cardiologist, Pulmonologist or PCP regarding          stopping Aspirin, Coumadin, Plavix ,Eliquis, Effient, or Pradaxa, and Pletal.  X____Stop Anti-inflammatories such as Advil, Aleve, Ibuprofen, Motrin, Naproxen, Naprosyn, Goodies powders or aspirin products. OK to take Tylenol and                          Celebrex.   _x___ Stop supplements until after surgery.  But may continue Vitamin D, Vitamin B,       and  multivitamin.   ____ Bring C-Pap to the hospital.

## 2018-09-16 ENCOUNTER — Ambulatory Visit: Payer: Medicare HMO

## 2018-09-16 ENCOUNTER — Ambulatory Visit
Admission: RE | Admit: 2018-09-16 | Discharge: 2018-09-16 | Disposition: A | Discharge status: Home / Self Care | Payer: Medicare HMO | Attending: Orthopedic Surgery | Admitting: Orthopedic Surgery

## 2018-09-16 ENCOUNTER — Encounter: Payer: Self-pay | Admitting: Anesthesiology

## 2018-09-16 ENCOUNTER — Ambulatory Visit: Payer: Medicare HMO | Admitting: Anesthesiology

## 2018-09-16 ENCOUNTER — Other Ambulatory Visit: Payer: Self-pay

## 2018-09-16 ENCOUNTER — Encounter
Admission: RE | Disposition: A | Discharge status: Home / Self Care | Payer: Self-pay | Source: Home / Self Care | Attending: Orthopedic Surgery

## 2018-09-16 DIAGNOSIS — I120 Hypertensive chronic kidney disease with stage 5 chronic kidney disease or end stage renal disease: Secondary | ICD-10-CM | POA: Insufficient documentation

## 2018-09-16 DIAGNOSIS — S32030A Wedge compression fracture of third lumbar vertebra, initial encounter for closed fracture: Secondary | ICD-10-CM | POA: Diagnosis not present

## 2018-09-16 DIAGNOSIS — Q602 Renal agenesis, unspecified: Secondary | ICD-10-CM | POA: Diagnosis not present

## 2018-09-16 DIAGNOSIS — Z87440 Personal history of urinary (tract) infections: Secondary | ICD-10-CM | POA: Diagnosis not present

## 2018-09-16 DIAGNOSIS — D693 Immune thrombocytopenic purpura: Secondary | ICD-10-CM | POA: Insufficient documentation

## 2018-09-16 DIAGNOSIS — X509XXA Other and unspecified overexertion or strenuous movements or postures, initial encounter: Secondary | ICD-10-CM | POA: Diagnosis not present

## 2018-09-16 DIAGNOSIS — Z79899 Other long term (current) drug therapy: Secondary | ICD-10-CM | POA: Insufficient documentation

## 2018-09-16 DIAGNOSIS — E1122 Type 2 diabetes mellitus with diabetic chronic kidney disease: Secondary | ICD-10-CM | POA: Insufficient documentation

## 2018-09-16 DIAGNOSIS — Z87442 Personal history of urinary calculi: Secondary | ICD-10-CM | POA: Insufficient documentation

## 2018-09-16 DIAGNOSIS — N186 End stage renal disease: Secondary | ICD-10-CM | POA: Insufficient documentation

## 2018-09-16 DIAGNOSIS — Z419 Encounter for procedure for purposes other than remedying health state, unspecified: Secondary | ICD-10-CM

## 2018-09-16 DIAGNOSIS — Z7982 Long term (current) use of aspirin: Secondary | ICD-10-CM | POA: Diagnosis not present

## 2018-09-16 DIAGNOSIS — E039 Hypothyroidism, unspecified: Secondary | ICD-10-CM | POA: Diagnosis not present

## 2018-09-16 DIAGNOSIS — M1990 Unspecified osteoarthritis, unspecified site: Secondary | ICD-10-CM | POA: Insufficient documentation

## 2018-09-16 DIAGNOSIS — E785 Hyperlipidemia, unspecified: Secondary | ICD-10-CM | POA: Diagnosis not present

## 2018-09-16 DIAGNOSIS — Z992 Dependence on renal dialysis: Secondary | ICD-10-CM | POA: Insufficient documentation

## 2018-09-16 HISTORY — PX: KYPHOPLASTY: SHX5884

## 2018-09-16 LAB — POCT I-STAT 4, (NA,K, GLUC, HGB,HCT)
Glucose, Bld: 123 mg/dL — ABNORMAL HIGH (ref 70–99)
HCT: 32 % — ABNORMAL LOW (ref 39.0–52.0)
Hemoglobin: 10.9 g/dL — ABNORMAL LOW (ref 13.0–17.0)
Potassium: 4 mmol/L (ref 3.5–5.1)
Sodium: 139 mmol/L (ref 135–145)

## 2018-09-16 LAB — GLUCOSE, CAPILLARY
Glucose-Capillary: 117 mg/dL — ABNORMAL HIGH (ref 70–99)
Glucose-Capillary: 132 mg/dL — ABNORMAL HIGH (ref 70–99)

## 2018-09-16 SURGERY — KYPHOPLASTY
Anesthesia: General

## 2018-09-16 MED ORDER — METOCLOPRAMIDE HCL 10 MG PO TABS: 5.0000 mg | ORAL_TABLET | Freq: Three times a day (TID) | ORAL | Status: DC | PRN

## 2018-09-16 MED ORDER — MORPHINE SULFATE (PF) 4 MG/ML IV SOLN: 0.5000 mg | INTRAVENOUS | Status: DC | PRN

## 2018-09-16 MED ORDER — BUPIVACAINE-EPINEPHRINE (PF) 0.5% -1:200000 IJ SOLN
INTRAMUSCULAR | Status: DC | PRN
  Administered 2018-09-16: 15 mL

## 2018-09-16 MED ORDER — PROPOFOL 500 MG/50ML IV EMUL
INTRAVENOUS | Status: DC | PRN
  Administered 2018-09-16: 40 ug/kg/min via INTRAVENOUS

## 2018-09-16 MED ORDER — HYDROCODONE-ACETAMINOPHEN 5-325 MG PO TABS: 1.0000 | ORAL_TABLET | ORAL | Status: DC | PRN

## 2018-09-16 MED ORDER — HYDROCODONE-ACETAMINOPHEN 7.5-325 MG PO TABS: 1.0000 | ORAL_TABLET | ORAL | Status: DC | PRN

## 2018-09-16 MED ORDER — FAMOTIDINE 20 MG PO TABS
ORAL_TABLET | ORAL | Status: AC
  Administered 2018-09-16: 20 mg via ORAL
  Filled 2018-09-16: qty 1

## 2018-09-16 MED ORDER — FENTANYL CITRATE (PF) 100 MCG/2ML IJ SOLN: 25.0000 ug | INTRAMUSCULAR | Status: DC | PRN

## 2018-09-16 MED ORDER — CEFAZOLIN SODIUM-DEXTROSE 2-4 GM/100ML-% IV SOLN
INTRAVENOUS | Status: AC
  Filled 2018-09-16: qty 100

## 2018-09-16 MED ORDER — PROPOFOL 10 MG/ML IV BOLUS
INTRAVENOUS | Status: DC | PRN
  Administered 2018-09-16: 20 mg via INTRAVENOUS
  Administered 2018-09-16 (×2): 10 mg via INTRAVENOUS
  Administered 2018-09-16: 20 mg via INTRAVENOUS
  Administered 2018-09-16: 10 mg via INTRAVENOUS

## 2018-09-16 MED ORDER — METOCLOPRAMIDE HCL 5 MG/ML IJ SOLN: 5.0000 mg | Freq: Three times a day (TID) | INTRAMUSCULAR | Status: DC | PRN

## 2018-09-16 MED ORDER — ACETAMINOPHEN 325 MG PO TABS: 325.0000 mg | ORAL_TABLET | Freq: Four times a day (QID) | ORAL | Status: DC | PRN

## 2018-09-16 MED ORDER — ONDANSETRON HCL 4 MG PO TABS: 4.0000 mg | ORAL_TABLET | Freq: Four times a day (QID) | ORAL | Status: DC | PRN

## 2018-09-16 MED ORDER — MIDAZOLAM HCL 2 MG/2ML IJ SOLN
INTRAMUSCULAR | Status: AC
  Filled 2018-09-16: qty 2

## 2018-09-16 MED ORDER — ONDANSETRON HCL 4 MG/2ML IJ SOLN: 4.0000 mg | Freq: Four times a day (QID) | INTRAMUSCULAR | Status: DC | PRN

## 2018-09-16 MED ORDER — FAMOTIDINE 20 MG PO TABS
20.0000 mg | ORAL_TABLET | Freq: Once | ORAL | Status: AC
  Administered 2018-09-16: 09:00:00 20 mg via ORAL

## 2018-09-16 MED ORDER — TRAMADOL HCL 50 MG PO TABS: 50.0000 mg | ORAL_TABLET | Freq: Four times a day (QID) | ORAL | Status: DC

## 2018-09-16 MED ORDER — MIDAZOLAM HCL 2 MG/2ML IJ SOLN
INTRAMUSCULAR | Status: DC | PRN
  Administered 2018-09-16 (×2): 1 mg via INTRAVENOUS

## 2018-09-16 MED ORDER — PROPOFOL 500 MG/50ML IV EMUL
INTRAVENOUS | Status: AC
  Filled 2018-09-16: qty 50

## 2018-09-16 MED ORDER — LIDOCAINE HCL 1 % IJ SOLN
INTRAMUSCULAR | Status: DC | PRN
  Administered 2018-09-16: 25 mL

## 2018-09-16 MED ORDER — SODIUM CHLORIDE 0.9 % IV SOLN
INTRAVENOUS | Status: DC
  Administered 2018-09-16: 09:00:00 via INTRAVENOUS

## 2018-09-16 MED ORDER — SODIUM CHLORIDE 0.9 % IV SOLN: INTRAVENOUS | Status: DC

## 2018-09-16 SURGICAL SUPPLY — 20 items
CEMENT KYPHON CX01A KIT/MIXER (Cement) ×3 IMPLANT
COVER WAND RF STERILE (DRAPES) ×3 IMPLANT
DERMABOND ADVANCED (GAUZE/BANDAGES/DRESSINGS) ×2
DERMABOND ADVANCED .7 DNX12 (GAUZE/BANDAGES/DRESSINGS) ×1 IMPLANT
DEVICE BIOPSY BONE KYPHX (INSTRUMENTS) ×3 IMPLANT
DRAPE C-ARM XRAY 36X54 (DRAPES) ×3 IMPLANT
DURAPREP 26ML APPLICATOR (WOUND CARE) ×3 IMPLANT
FEE RENTAL RFA GENERATOR (MISCELLANEOUS) IMPLANT
GLOVE SURG SYN 9.0  PF PI (GLOVE) ×2
GLOVE SURG SYN 9.0 PF PI (GLOVE) ×1 IMPLANT
GOWN SRG 2XL LVL 4 RGLN SLV (GOWNS) ×1 IMPLANT
GOWN STRL NON-REIN 2XL LVL4 (GOWNS) ×2
GOWN STRL REUS W/ TWL LRG LVL3 (GOWN DISPOSABLE) ×1 IMPLANT
GOWN STRL REUS W/TWL LRG LVL3 (GOWN DISPOSABLE) ×2
PACK KYPHOPLASTY (MISCELLANEOUS) ×3 IMPLANT
RENTAL RFA  GENERATOR (MISCELLANEOUS)
RENTAL RFA GENERATOR (MISCELLANEOUS) IMPLANT
STRAP SAFETY 5IN WIDE (MISCELLANEOUS) ×3 IMPLANT
TRAY KYPHOPAK 15/3 EXPRESS 1ST (MISCELLANEOUS) ×1 IMPLANT
TRAY KYPHOPAK 20/3 EXPRESS 1ST (MISCELLANEOUS) ×3 IMPLANT

## 2018-09-16 NOTE — Anesthesia Preprocedure Evaluation (Addendum)
Anesthesia Evaluation  Patient identified by MRN, date of birth, ID band Patient awake    Reviewed: Allergy & Precautions, NPO status , Patient's Chart, lab work & pertinent test results  History of Anesthesia Complications Negative for: history of anesthetic complications  Airway Mallampati: I       Dental  (+) Partial Upper, Partial Lower, Dental Advidsory Given   Pulmonary neg pulmonary ROS,           Cardiovascular hypertension, Pt. on medications (-) angina(-) CAD, (-) Past MI, (-) Cardiac Stents and (-) CABG      Neuro/Psych negative neurological ROS     GI/Hepatic negative GI ROS, Neg liver ROS,   Endo/Other  diabetes, Type 2Hypothyroidism   Renal/GU Renal Insufficiency and ESRFRenal disease (stones)     Musculoskeletal   Abdominal   Peds  Hematology negative hematology ROS (+)   Anesthesia Other Findings Past Medical History: No date: Arthritis No date: Dialysis patient [facility])     Comment:  Mon, Wed Fri No date: Hyperlipidemia No date: Hypertension No date: Hypothyroidism No date: Idiopathic thrombocytopenia purpura ([place]) No date: Kidney stones No date: Recurrent UTI No date: Renal agenesis     Comment:  discovered at age 85 No date: Type 2 diabetes mellitus ([place]) No date: Ureteral stricture   Reproductive/Obstetrics                            Anesthesia Physical  Anesthesia Plan  ASA: IV  Anesthesia Plan: General   Post-op Pain Management:    Induction: Intravenous  PONV Risk Score and Plan: 1 and Propofol infusion and TIVA  Airway Management Planned: Natural Airway and Simple Face Mask  Additional Equipment:   Intra-op Plan:   Post-operative Plan:   Informed Consent: I have reviewed the patients History and Physical, chart, labs and discussed the procedure including the risks, benefits and alternatives for the proposed anesthesia with the patient or  authorized representative who has indicated his/her understanding and acceptance.       Plan Discussed with:   Anesthesia Plan Comments:        Anesthesia Quick Evaluation

## 2018-09-16 NOTE — Anesthesia Post-op Follow-up Note (Signed)
Anesthesia QCDR form completed.        

## 2018-09-16 NOTE — H&P (Signed)
Reviewed paper H+P, will be scanned into chart. No changes noted. MRI showed L1 fracture to be chronic with the L3 fracture to be more acute.  He is here for L3 kyphoplasty.

## 2018-09-16 NOTE — Transfer of Care (Signed)
Immediate Anesthesia Transfer of Care Note  Patient: Alejandro Armstrong  Procedure(s) Performed: KYPHOPLASTY L3, DIABETIC ([name] )  Patient Location: PACU  Anesthesia Type:MAC  Level of Consciousness: awake  Airway & Oxygen Therapy: Patient connected to nasal cannula oxygen  Post-op Assessment: Post -op Vital signs reviewed and stable  Post vital signs: stable  Last Vitals:  Vitals Value Taken Time  BP 140/68 09/16/2018  9:39 AM  Temp 36.6 C 09/16/2018  9:38 AM  Pulse 71 09/16/2018  9:39 AM  Resp 15 09/16/2018  9:39 AM  SpO2 100 % 09/16/2018  9:39 AM  Vitals shown include unvalidated device data.  Last Pain:  Vitals:   09/16/18 0829  TempSrc: Temporal  PainSc: 6          Complications: No apparent anesthesia complications

## 2018-09-16 NOTE — Op Note (Signed)
Date September 16, 2018  time 9:39 AM   PATIENT:  Alejandro Armstrong   PRE-OPERATIVE DIAGNOSIS:  closed wedge compression fracture of L3   POST-OPERATIVE DIAGNOSIS:  closed wedge compression fracture of L3   PROCEDURE:  Procedure(s): KYPHOPLASTY L3  SURGEON: Laurene Footman, MD   ASSISTANTS: None   ANESTHESIA:   local and MAC   EBL:  No intake/output data recorded.   BLOOD ADMINISTERED:none   DRAINS: none    LOCAL MEDICATIONS USED:  MARCAINE    and XYLOCAINE    SPECIMEN:   L3 vertebral body biopsy   DISPOSITION OF SPECIMEN:  Pathology   COUNTS:  YES   TOURNIQUET:  * No tourniquets in log *   IMPLANTS: Bone cement   DICTATION: .Dragon Dictation  patient was brought to the operating room and after adequate anesthesia was obtained the patient was placed prone.  C arm was brought in in good visualization of the affected level obtained on both AP and lateral projections.  After patient identification and timeout procedures were completed, local anesthetic was infiltrated with 10 cc 1% Xylocaine infiltrated subcutaneously.  This is done the area on the right side of the planned approach.  The back was then prepped and draped in the usual sterile manner and repeat timeout procedure carried out.  A spinal needle was brought down to the pedicle on the right side of  L3 and a 50-50 mix of 1% Xylocaine half percent Sensorcaine with epinephrine total of 20 cc injected.  After allowing this to set a small incision was made and the trocar was advanced into the vertebral body in an extrapedicular fashion.  Biopsy was obtained Drilling was carried out balloon inserted with inflation to  6 cc.  When the cement was appropriate consistency 7 cc were injected into the vertebral body without extravasation, good fill superior to inferior endplates and from right to left sides along the inferior endplate.  After the cement had set the trochar was removed and permanent C-arm views obtained.  The wound was  closed with Dermabond followed by Band-Aid   PLAN OF CARE: Discharge to home after PACU   PATIENT DISPOSITION:  PACU - hemodynamically stable.

## 2018-09-16 NOTE — Discharge Instructions (Addendum)
Take it easy today and tomorrow and then resume normal activity Saturday.  Take Band-Aid off on Saturday then okay to shower.  Call office if you are having problems.  AMBULATORY SURGERY  DISCHARGE INSTRUCTIONS   1) The drugs that you were given will stay in your system until tomorrow so for the next 24 hours you should not:  A) Drive an automobile B) Make any legal decisions C) Drink any alcoholic beverage   2) You may resume regular meals tomorrow.  Today it is better to start with liquids and gradually work up to solid foods.  You may eat anything you prefer, but it is better to start with liquids, then soup and crackers, and gradually work up to solid foods.   3) Please notify your doctor immediately if you have any unusual bleeding, trouble breathing, redness and pain at the surgery site, drainage, fever, or pain not relieved by medication.    4) Additional Instructions:        Please contact your physician with any problems or Same Day Surgery at [phone], Monday through Friday 6 am to 4 pm, or [facility] number at [phone].

## 2018-09-17 LAB — SURGICAL PATHOLOGY

## 2018-09-17 NOTE — Anesthesia Postprocedure Evaluation (Signed)
Anesthesia Post Note  Patient: Alejandro Armstrong  Procedure(s) Performed: KYPHOPLASTY L3, DIABETIC ([name] )  Patient location during evaluation: PACU Anesthesia Type: General Level of consciousness: awake and alert Pain management: pain level controlled Vital Signs Assessment: post-procedure vital signs reviewed and stable Respiratory status: spontaneous breathing, nonlabored ventilation, respiratory function stable and patient connected to nasal cannula oxygen Cardiovascular status: blood pressure returned to baseline and stable Postop Assessment: no apparent nausea or vomiting Anesthetic complications: no     Last Vitals:  Vitals:   09/16/18 1024 09/16/18 1037  BP: (!) 142/60 (!) 149/62  Pulse: 63 (!) 59  Resp: 16 16  Temp:  (!) 36.4 C  SpO2: 97% 98%    Last Pain:  Vitals:   09/17/18 0900  TempSrc:   PainSc: 2                  Martha Clan

## 2018-09-21 ENCOUNTER — Other Ambulatory Visit (INDEPENDENT_AMBULATORY_CARE_PROVIDER_SITE_OTHER): Payer: Self-pay | Admitting: Vascular Surgery

## 2018-09-21 DIAGNOSIS — N186 End stage renal disease: Secondary | ICD-10-CM

## 2018-09-21 DIAGNOSIS — Z95820 Peripheral vascular angioplasty status with implants and grafts: Secondary | ICD-10-CM

## 2018-09-27 ENCOUNTER — Ambulatory Visit (INDEPENDENT_AMBULATORY_CARE_PROVIDER_SITE_OTHER): Payer: Medicare HMO | Admitting: Vascular Surgery

## 2018-09-27 ENCOUNTER — Encounter (INDEPENDENT_AMBULATORY_CARE_PROVIDER_SITE_OTHER): Payer: Medicare HMO

## 2018-09-30 ENCOUNTER — Ambulatory Visit (INDEPENDENT_AMBULATORY_CARE_PROVIDER_SITE_OTHER): Payer: Medicare HMO | Admitting: Vascular Surgery

## 2018-09-30 ENCOUNTER — Ambulatory Visit (INDEPENDENT_AMBULATORY_CARE_PROVIDER_SITE_OTHER): Payer: Medicare HMO

## 2018-09-30 ENCOUNTER — Other Ambulatory Visit: Payer: Self-pay

## 2018-09-30 ENCOUNTER — Encounter (INDEPENDENT_AMBULATORY_CARE_PROVIDER_SITE_OTHER): Payer: Self-pay | Admitting: Vascular Surgery

## 2018-09-30 VITALS — BP 122/70 | HR 64 | Resp 16 | Ht 67.0 in | Wt 167.0 lb

## 2018-09-30 DIAGNOSIS — T829XXS Unspecified complication of cardiac and vascular prosthetic device, implant and graft, sequela: Secondary | ICD-10-CM

## 2018-09-30 DIAGNOSIS — N186 End stage renal disease: Secondary | ICD-10-CM

## 2018-09-30 DIAGNOSIS — T829XXD Unspecified complication of cardiac and vascular prosthetic device, implant and graft, subsequent encounter: Secondary | ICD-10-CM

## 2018-09-30 DIAGNOSIS — Z992 Dependence on renal dialysis: Secondary | ICD-10-CM

## 2018-09-30 DIAGNOSIS — Z95820 Peripheral vascular angioplasty status with implants and grafts: Secondary | ICD-10-CM | POA: Diagnosis not present

## 2018-09-30 DIAGNOSIS — N185 Chronic kidney disease, stage 5: Secondary | ICD-10-CM

## 2018-09-30 DIAGNOSIS — E1122 Type 2 diabetes mellitus with diabetic chronic kidney disease: Secondary | ICD-10-CM | POA: Diagnosis not present

## 2018-09-30 NOTE — Progress Notes (Signed)
MRN : [id number]  Alejandro Armstrong is a 85 y.o. (July 21, 1929) male who presents with chief complaint of  Chief Complaint  Patient presents with   Follow-up    [facility] 3week HDA  .  History of Present Illness:  The patient returns to the office for followup status post intervention of the dialysis access 09/07/2018.  Following the intervention the access function has significantly improved, with better flow rates and improved KT/V. The patient has not been experiencing increased bleeding times following decannulation and the patient denies increased recirculation. The patient denies an increase in arm swelling. At the present time the patient denies hand pain.  The patient denies amaurosis fugax or recent TIA symptoms. There are no recent neurological changes noted. The patient denies claudication symptoms or rest pain symptoms. The patient denies history of DVT, PE or superficial thrombophlebitis. The patient denies recent episodes of angina or shortness of breath.        Current Meds  Medication Sig   ACCU-CHEK AVIVA PLUS test strip 1 each by Other route as directed.    acetaminophen (TYLENOL) 500 MG tablet Take 1,000 mg by mouth every 6 (six) hours as needed for moderate pain or headache.    amLODipine (NORVASC) 5 MG tablet Take 5 mg by mouth daily.   atorvastatin (LIPITOR) 10 MG tablet Take 10 mg by mouth daily.   B Complex-C-Folic Acid (RENA-VITE PO) Take 1 tablet by mouth daily.   BD PEN NEEDLE NANO U/F 32G X 4 MM MISC    Carboxymethylcellulose Sodium (THERATEARS) 0.25 % SOLN Place 1-2 drops into both eyes 3 (three) times daily as needed (for dry eyes).   furosemide (LASIX) 40 MG tablet Take 40 mg by mouth 2 (two) times daily. In the morning and in the afternoon   hydrALAZINE (APRESOLINE) 25 MG tablet Take 25 mg by mouth 2 (two) times daily.   insulin detemir (LEVEMIR) 100 UNIT/ML injection Inject 20 Units into the skin daily before breakfast.    levothyroxine  (SYNTHROID, LEVOTHROID) 112 MCG tablet Take 112 mcg by mouth daily before breakfast.   levothyroxine (SYNTHROID, LEVOTHROID) 125 MCG tablet Take 125 mcg by mouth daily before breakfast.   lidocaine-prilocaine (EMLA) cream Apply 1 application topically daily as needed (port access).    lisinopril (PRINIVIL,ZESTRIL) 20 MG tablet Take 20 mg by mouth daily.    multivitamin (RENA-VIT) TABS tablet Take 1 tablet by mouth daily.   Omega-3 1000 MG CAPS Take 2,000 mg by mouth 2 (two) times daily.    traMADol (ULTRAM) 50 MG tablet Take 2 tablets (100 mg total) by mouth every 6 (six) hours as needed for moderate pain or severe pain.    Past Medical History:  Diagnosis Date   Arthritis    Dialysis patient ([place])    Mon, Wed Fri   Hyperlipidemia    Hypertension    Hypothyroidism    Idiopathic thrombocytopenia purpura ([place])    Kidney stones    Recurrent UTI    Renal agenesis    discovered at age 36   Type 2 diabetes mellitus ([place])    Ureteral stricture     Past Surgical History:  Procedure Laterality Date   A/V FISTULAGRAM Left 03/31/2017   Procedure: A/V Fistulagram;  Surgeon: [name], MD;  Location: [place] [facility];  Service: Cardiovascular;  Laterality: Left;   A/V FISTULAGRAM Left 09/07/2018   Procedure: A/V FISTULAGRAM;  Surgeon: [name], MD;  Location: [place] [facility];  Service:  Cardiovascular;  Laterality: Left;   A/V SHUNTOGRAM Left 07/28/2017   Procedure: A/V SHUNTOGRAM;  Surgeon: [name], MD;  Location: [place] [facility];  Service: Cardiovascular;  Laterality: Left;   A/V SHUNTOGRAM Left 03/24/2018   Procedure: A/V SHUNTOGRAM;  Surgeon: [name], MD;  Location: [place] [facility];  Service: Cardiovascular;  Laterality: Left;   APPENDECTOMY     AV FISTULA PLACEMENT Left 04/16/2016   Procedure: INSERTION OF ARTERIOVENOUS (AV) GORE-TEX GRAFT ARM;  Surgeon: [name], MD;  Location: [facility];  Service:  Vascular;  Laterality: Left;   CATARACT EXTRACTION W/ INTRAOCULAR LENS IMPLANT Bilateral 2014   done 1-2 months [facility] at [facility].   FINGER SURGERY Left 2002   pinky finger   KIDNEY STONE SURGERY     KYPHOPLASTY [name] 09/16/2018   Procedure: KYPHOPLASTY L3, DIABETIC;  Surgeon: Hessie Knows, MD;  Location: [facility];  Service: Orthopedics;  Laterality: [name];   NASAL SINUS SURGERY  1985   PERIPHERAL VASCULAR CATHETERIZATION  05/13/2016   Procedure: Upper Extremity Angiography;  Surgeon: [name], MD;  Location: [place] [facility];  Service: Cardiovascular;;   PERIPHERAL VASCULAR CATHETERIZATION  05/13/2016   Procedure: Upper Extremity Intervention;  Surgeon: [name], MD;  Location: [place] [facility];  Service: Cardiovascular;;   PICC LINE PLACE PERIPHERAL ([place] HX) Right 08/2015   PICC LINE REMOVAL ([place] HX) Right 09/2015    Social History Social History   Tobacco Use   Smoking status: Never Smoker   Smokeless tobacco: Never Used  Substance Use Topics   Alcohol use: No    Alcohol/week: 0.0 standard drinks   Drug use: No    Family History Family History  Problem Relation Age of Onset   Cervical cancer Sister     No Known Allergies   REVIEW OF SYSTEMS (Negative unless checked)  Constitutional: [] Weight loss  [] Fever  [] Chills Cardiac: [] Chest pain   [] Chest pressure   [] Palpitations   [] Shortness of breath when laying flat   [] Shortness of breath with exertion. Vascular:  [] Pain in legs with walking   [] Pain in legs at rest  [] History of DVT   [] Phlebitis   [] Swelling in legs   [] Varicose veins   [] Non-healing ulcers Pulmonary:   [] Uses home oxygen   [] Productive cough   [] Hemoptysis   [] Wheeze  [] COPD   [] Asthma Neurologic:  [] Dizziness   [] Seizures   [] History of stroke   [] History of TIA  [] Aphasia   [] Vissual changes   [] Weakness or numbness in arm   [] Weakness or numbness in leg Musculoskeletal:   [] Joint swelling   [] Joint pain   [] Low back  pain Hematologic:  [] Easy bruising  [] Easy bleeding   [] Hypercoagulable state   [] Anemic Gastrointestinal:  [] Diarrhea   [] Vomiting  [] Gastroesophageal reflux/heartburn   [] Difficulty swallowing. Genitourinary:  [x] Chronic kidney disease   [] Difficult urination  [] Frequent urination   [] Blood in urine Skin:  [] Rashes   [] Ulcers  Psychological:  [] History of anxiety   []  History of major depression.  Physical Examination  Vitals:   09/30/18 1106  BP: 122/70  Pulse: 64  Resp: 16  Weight: 167 lb (75.8 kg)  Height: 5\' 7"  (1.702 m)   Body mass index is 26.16 kg/m. Gen: WD/WN, NAD Head: [place]/AT, No temporalis wasting.  Ear/Nose/Throat: Hearing grossly intact, nares w/o erythema or drainage Eyes: PER, EOMI, sclera nonicteric.  Neck: Supple, no large masses.   Pulmonary:  Good air movement, no audible wheezing  bilaterally, no use of accessory muscles.  Cardiac: RRR, no JVD Vascular: left arm av graft good thrill good bruit Vessel Right Left  Radial Palpable Palpable  Brachial Palpable Palpable  Gastrointestinal: Non-distended. No guarding/no peritoneal signs.  Musculoskeletal: M/S 5/5 throughout.  No deformity or atrophy.  Neurologic: CN 2-12 intact. Symmetrical.  Speech is fluent. Motor exam as listed above. Psychiatric: Judgment intact, Mood & affect appropriate for pt's clinical situation. Dermatologic: No rashes or ulcers noted.  No changes consistent with cellulitis. Lymph : No lichenification or skin changes of chronic lymphedema.  CBC Lab Results  Component Value Date   WBC 8.4 09/07/2018   HGB 10.9 (L) 09/16/2018   HCT 32.0 (L) 09/16/2018   MCV 122.6 (H) 09/07/2018   PLT 129 (L) 09/07/2018    BMET    Component Value Date/Time   NA 139 09/16/2018 0829   NA 137 10/15/2012 2113   K 4.0 09/16/2018 0829   K 4.2 10/15/2012 2113   CL 109 09/07/2018 0205   CL 110 (H) 10/15/2012 2113   CO2 18 (L) 09/07/2018 0205   CO2 16 (L) 10/15/2012 2113   GLUCOSE 123 (H) 09/16/2018  0829   GLUCOSE 142 (H) 10/15/2012 2113   BUN 57 (H) 09/07/2018 0205   BUN 51 (H) 10/15/2012 2113   CREATININE 7.58 (H) 09/07/2018 0205   CREATININE 3.24 (H) 10/15/2012 2113   CALCIUM 8.8 (L) 09/07/2018 0205   CALCIUM 8.7 10/15/2012 2113   GFRNONAA 6 (L) 09/07/2018 0205   GFRNONAA 17 (L) 10/15/2012 2113   GFRAA 7 (L) 09/07/2018 0205   GFRAA 20 (L) 10/15/2012 2113   CrCl cannot be calculated (Patient's most recent lab result is older than the maximum 21 days allowed.).  COAG Lab Results  Component Value Date   INR 1.0 09/07/2018   INR 0.94 04/02/2016   INR 1.96 12/17/2015    Radiology Dg Lumbar Spine 2-3 Views  Result Date: 09/16/2018 CLINICAL DATA:  Kyphoplasty L3 EXAM: LUMBAR SPINE - 2-3 VIEW; DG C-ARM 61-120 MIN COMPARISON:  Plain films 09/09/2018 FINDINGS: Changes of kyphoplasty noted at L3. No complicating feature. Moderate compression deformity at L1 partially imaged. IMPRESSION: Kyphoplasty at L3.  No visible complicating feature. Electronically Signed   By: Rolm Baptise M.D.   On: 09/16/2018 09:57   Dg Lumbar Spine 2-3 Views  Result Date: 09/09/2018 CLINICAL DATA:  Low back pain since a fall last night. EXAM: LUMBAR SPINE - 2-3 VIEW COMPARISON:  MRI lumbar spine 09/07/2018. FINDINGS: L1 and L3 compression fractures are identified as seen on the prior MRI and unchanged. No new fracture. Alignment and intervertebral disc space height are maintained. IMPRESSION: No acute abnormality. L1 and L3 compression fractures as seen on prior MRI are unchanged. Electronically Signed   By: Inge Rise M.D.   On: 09/09/2018 13:59   Mr Lumbar Spine Wo Contrast  Result Date: 09/07/2018 CLINICAL DATA:  Back pain for 3 days EXAM: MRI LUMBAR SPINE WITHOUT CONTRAST TECHNIQUE: Multiplanar, multisequence MR imaging of the lumbar spine was performed. No intravenous contrast was administered. COMPARISON:  CT abdomen pelvis 08/06/2015 FINDINGS: Segmentation: Normal. The lowest disc space is  considered to be L5-S1. Alignment:  Normal Vertebrae: Chronic compression deformity of L1 is unchanged. There is mild compression deformity at the L3 superior endplate with less than 20% height loss. Minimal edema. Conus medullaris and cauda equina: The conus medullaris terminates at the L1 level. The cauda equina and conus medullaris are both normal. Paraspinal and other soft tissues:  Severely atrophic native kidneys. Disc levels: Sagittal plane imaging includes the T11-12 disc level through the upper sacrum, with axial imaging of the T12-L1 to L5-S1 disc levels. T12-L1: No disc herniation or stenosis. L1-2: Disc desiccation without spinal canal or neural foraminal stenosis. Normal facets. L2-3: Mild disc bulge without spinal canal or neural foraminal stenosis. Normal facets. L3-4: Diffuse mild disc bulge with narrowing of the lateral recesses but no central spinal canal stenosis. No neural foraminal stenosis. L4-5: Disc bulge with mild facet hypertrophy. No central spinal canal stenosis. Right-greater-than-left lateral recess narrowing. No neural foraminal stenosis. L5-S1: Small right subarticular disc protrusion with annular fissure in close proximity to the right S1 nerve root. No central spinal canal or neural foraminal stenosis. The visualized portion of the sacrum is normal. IMPRESSION: 1. Age indeterminate compression deformity of L3 with mild height loss and minimal edema. 2. L5-S1 right subarticular disc protrusion with annular fissure in close proximity to the right S1 nerve root, which could be a source of radicular pain. 3. Multilevel mild degenerative disc disease with lateral recess narrowing at L3-4 and L4-5, but no central spinal canal stenosis or neural impingement. Electronically Signed   By: Ulyses Jarred M.D.   On: 09/07/2018 04:06   Dg C-arm 1-60 Min  Result Date: 09/16/2018 CLINICAL DATA:  Kyphoplasty L3 EXAM: LUMBAR SPINE - 2-3 VIEW; DG C-ARM 61-120 MIN COMPARISON:  Plain films 09/09/2018  FINDINGS: Changes of kyphoplasty noted at L3. No complicating feature. Moderate compression deformity at L1 partially imaged. IMPRESSION: Kyphoplasty at L3.  No visible complicating feature. Electronically Signed   By: Rolm Baptise M.D.   On: 09/16/2018 09:57   Vas US Duplex Dialysis Access (avf, Avg)  Result Date: 09/02/2018 DIALYSIS ACCESS Reason for Exam: Routine follow up. Access Site: Left Upper Extremity. History: 04/16/16: Left brachial-axillary AVG;          05/13/16: Left brachia & radial artery PTAs for steal;          03/31/17: Outflow vein PTA/stent;          03/24/18: PTA stricture of mid AVG          07/28/17: Peripheral segment PTA/stent with central venous PTA;. Performing Technologist: Charlane Ferretti RT (R)(VS) Supporting Technologist: Blondell Reveal RT, RDMS, RVT  Examination Guidelines: A complete evaluation includes B-mode imaging, spectral Doppler, color Doppler, and power Doppler as needed of all accessible portions of each vessel. Unilateral testing is considered an integral part of a complete examination. Limited examinations for reoccurring indications may be performed as noted.  Findings:  +--------------------+----------+-----------------+--------+  AVG                  PSV (cm/s) Flow Vol (mL/min) Describe  +--------------------+----------+-----------------+--------+  Native artery inflow    103            807                  +--------------------+----------+-----------------+--------+  Arterial anastomosis    799                                 +--------------------+----------+-----------------+--------+  Prox graft              134                                 +--------------------+----------+-----------------+--------+  Mid graft  102                                 +--------------------+----------+-----------------+--------+  Distal graft            124                                 +--------------------+----------+-----------------+--------+  Venous anastomosis        81                                 +--------------------+----------+-----------------+--------+  Venous outflow           71                                 +--------------------+----------+-----------------+--------+ Antegrade flow in the left distal radial artery with velocities of 63cm/s at rest and 70cm/s during HDA compression.  Summary: Patent AVG with a hemodynamically significant velocity increase at the arterial anastomosis shich appears to be due to a change in diameter and the angle of vessel takeoff. No internal vessel narrowing noted throughout the AVG. Radial artery velocities do not appear to be consistent with a significant dialysis access steal.  *See table(s) above for measurements and observations.  Diagnosing physician: Hortencia Pilar MD Electronically signed by Hortencia Pilar MD on 09/02/2018 at 4:32:22 PM.   --------------------------------------------------------------------------------   Final      Assessment/Plan 1. Complication of vascular access for dialysis, sequela Recommend:  The patient is doing well and currently has adequate dialysis access. The patient's dialysis center is not reporting any access issues. Flow pattern is stable when compared to the prior ultrasound.  The patient should have a duplex ultrasound of the dialysis access in 6 months. The patient will follow-up with me in the office after each ultrasound    - VAS [place] (AVF, AVG); Future  2. ESRD on dialysis ([place]) Continue HD without interruption   3. Type 2 diabetes mellitus with stage 5 chronic kidney disease not on chronic dialysis, without long-term current use of insulin (HCC) Continue hypoglycemic medications as already ordered, these medications have been reviewed and there are no changes at this time.  Hgb A1C to be monitored as already arranged by primary service     Hortencia Pilar, MD  09/30/2018 3:04 PM

## 2018-10-05 ENCOUNTER — Ambulatory Visit: Payer: Medicare HMO | Admitting: Oncology

## 2018-10-05 ENCOUNTER — Other Ambulatory Visit: Payer: Medicare HMO

## 2018-10-12 ENCOUNTER — Other Ambulatory Visit: Payer: Medicare HMO

## 2018-10-12 ENCOUNTER — Ambulatory Visit: Payer: Medicare HMO | Admitting: Oncology

## 2018-10-21 ENCOUNTER — Ambulatory Visit (INDEPENDENT_AMBULATORY_CARE_PROVIDER_SITE_OTHER): Payer: Medicare HMO | Admitting: Vascular Surgery

## 2018-10-21 ENCOUNTER — Encounter (INDEPENDENT_AMBULATORY_CARE_PROVIDER_SITE_OTHER): Payer: Medicare HMO

## 2019-01-09 NOTE — Progress Notes (Deleted)
[place]  [phone] [phone]  ID: [name] OB: 01-28-1930  MR#: [id number]  UDJ#:[id number]  Patient Care Team: Casilda Carls, MD as PCP - General (Internal Medicine)  CHIEF COMPLAINT: Bone marrow biopsy confirmed ITP.  INTERVAL HISTORY: Patient returns to clinic today for repeat laboratory work and routine six-month evaluation.  He continues to feel well and remains asymptomatic.  He denies any easy bleeding or bruising.  He does not complain of any weakness or fatigue.  He has no neurologic complaints.  He denies any recent fevers or illnesses.  He has no chest pain or shortness of breath. He denies any nausea, vomiting, constipation, or diarrhea. He has no urinary complaints.  Patient feels at his baseline offers no specific complaints today.  REVIEW OF SYSTEMS:   Review of Systems  Constitutional: Negative.  Negative for fever, malaise/fatigue and weight loss.  Respiratory: Negative.  Negative for cough and shortness of breath.   Cardiovascular: Negative.  Negative for chest pain and leg swelling.  Gastrointestinal: Negative.  Negative for blood in stool and melena.  Genitourinary: Negative.  Negative for dysuria and hematuria.  Musculoskeletal: Negative.  Negative for back pain.  Skin: Negative.  Negative for rash.  Neurological: Negative.  Negative for sensory change, focal weakness, weakness and headaches.  Endo/Heme/Allergies: Does not bruise/bleed easily.  Psychiatric/Behavioral: Negative.  The patient is not nervous/anxious.     As per HPI. Otherwise, a complete review of systems is negative.  PAST MEDICAL HISTORY: Past Medical History:  Diagnosis Date  . Arthritis   . Dialysis patient [facility])    Mon, Wed Fri  . Hyperlipidemia   . Hypertension   . Hypothyroidism   . Idiopathic thrombocytopenia purpura ([place])   . Kidney stones   . Recurrent UTI   . Renal agenesis    discovered at age 85  . Type 2 diabetes  mellitus ([facility])   . Ureteral stricture     PAST SURGICAL HISTORY: Past Surgical History:  Procedure Laterality Date  . A/V FISTULAGRAM Left 03/31/2017   Procedure: A/V Fistulagram;  Surgeon: [name], MD;  Location: [place] [facility];  Service: Cardiovascular;  Laterality: Left;  . A/V FISTULAGRAM Left 09/07/2018   Procedure: A/V FISTULAGRAM;  Surgeon: [name], MD;  Location: [place] [facility];  Service: Cardiovascular;  Laterality: Left;  . A/V SHUNTOGRAM Left 07/28/2017   Procedure: A/V SHUNTOGRAM;  Surgeon: [name], MD;  Location: [place] [facility];  Service: Cardiovascular;  Laterality: Left;  . A/V SHUNTOGRAM Left 03/24/2018   Procedure: A/V SHUNTOGRAM;  Surgeon: [name], MD;  Location: [place] [facility];  Service: Cardiovascular;  Laterality: Left;  . APPENDECTOMY    . AV FISTULA PLACEMENT Left 04/16/2016   Procedure: INSERTION OF ARTERIOVENOUS (AV) GORE-TEX GRAFT ARM;  Surgeon: [name], MD;  Location: [facility];  Service: Vascular;  Laterality: Left;  . CATARACT EXTRACTION W/ INTRAOCULAR LENS IMPLANT Bilateral 2014   done 1-2 months apart at [facility].  [name] FINGER SURGERY Left 2002   pinky finger  . KIDNEY STONE SURGERY    . KYPHOPLASTY [name] 09/16/2018   Procedure: KYPHOPLASTY L3, DIABETIC;  Surgeon: Hessie Knows, MD;  Location: [facility] OR[facility];  Service: Orthopedics;  Laterality: [name];  . NASAL SINUS SURGERY  1985  . PERIPHERAL VASCULAR CATHETERIZATION  05/13/2016   Procedure: Upper Extremity Angiography;  Surgeon: [name], MD;  Location: [place] [facility];  Service: Cardiovascular;;  . PERIPHERAL VASCULAR  CATHETERIZATION  05/13/2016   Procedure: Upper Extremity Intervention;  Surgeon: [name], MD;  Location: [place] [facility];  Service: Cardiovascular;;  . PICC LINE PLACE PERIPHERAL ([place] HX) Right 08/2015  . PICC LINE REMOVAL ([place] HX) Right 09/2015    FAMILY HISTORY: No reported history of malignancy or  chronic disease.      ADVANCED DIRECTIVES:    HEALTH MAINTENANCE: Social History   Tobacco Use  . Smoking status: Never Smoker  . Smokeless tobacco: Never Used  Substance Use Topics  . Alcohol use: No    Alcohol/week: 0.0 standard drinks  . Drug use: No     Colonoscopy:  PAP:  Bone density:  Lipid panel:  No Known Allergies  Current Outpatient Medications  Medication Sig Dispense Refill  . ACCU-CHEK AVIVA PLUS test strip 1 each by Other route as directed.     [name] acetaminophen (TYLENOL) 500 MG tablet Take 1,000 mg by mouth every 6 (six) hours as needed for moderate pain or headache.     [name] amLODipine (NORVASC) 5 MG tablet Take 5 mg by mouth daily.    [name] atorvastatin (LIPITOR) 10 MG tablet Take 10 mg by mouth daily.    . B Complex-C-Folic Acid (RENA-VITE PO) Take 1 tablet by mouth daily.    . BD PEN NEEDLE NANO U/F 32G X 4 MM MISC     . Carboxymethylcellulose Sodium (THERATEARS) 0.25 % SOLN Place 1-2 drops into both eyes 3 (three) times daily as needed (for dry eyes).    . furosemide (LASIX) 40 MG tablet Take 40 mg by mouth 2 (two) times daily. In the morning and in the afternoon    . hydrALAZINE (APRESOLINE) 25 MG tablet Take 25 mg by mouth 2 (two) times daily.    . insulin detemir (LEVEMIR) 100 UNIT/ML injection Inject 20 Units into the skin daily before breakfast.     . levothyroxine (SYNTHROID, LEVOTHROID) 112 MCG tablet Take 112 mcg by mouth daily before breakfast.    . levothyroxine (SYNTHROID, LEVOTHROID) 125 MCG tablet Take 125 mcg by mouth daily before breakfast.    . lidocaine-prilocaine (EMLA) cream Apply 1 application topically daily as needed (port access).   11  . lisinopril (PRINIVIL,ZESTRIL) 20 MG tablet Take 20 mg by mouth daily.     . multivitamin (RENA-VIT) TABS tablet Take 1 tablet by mouth daily.    . Omega-3 1000 MG CAPS Take 2,000 mg by mouth 2 (two) times daily.     . traMADol (ULTRAM) 50 MG tablet Take 2 tablets (100 mg total) by mouth every 6 (six)  hours as needed for moderate pain or severe pain. 20 tablet 0   No current facility-administered medications for this visit.    Facility-Administered Medications Ordered in Other Visits  Medication Dose Route Frequency Provider Last Rate Last Dose  . heparin lock flush 100 unit/mL  500 Units Intravenous Once [name], MD      . sodium chloride flush (NS) 0.9 % injection 10 mL  10 mL Intravenous PRN [name], MD      . sodium chloride flush (NS) 0.9 % injection 10 mL  10 mL Intravenous PRN [name], MD   10 mL at 08/16/15 0857    OBJECTIVE: There were no vitals filed for this visit.   There is no height or weight on file to calculate BMI.    ECOG FS:0 - Asymptomatic  General: Well-developed, well-nourished, no acute distress. Eyes: Pink conjunctiva, anicteric sclera.  HEENT: Normocephalic, moist mucous membranes. Lungs: Clear to auscultation bilaterally. Heart: Regular rate and rhythm. No rubs, murmurs, or gallops. Abdomen: Soft, nontender, nondistended. No organomegaly noted, normoactive bowel sounds. Musculoskeletal: No edema, cyanosis, or clubbing. Neuro: Alert, answering all questions appropriately. Cranial nerves grossly intact. Skin: No rashes or petechiae noted. Psych: Normal affect.  LAB RESULTS:  Lab Results  Component Value Date   NA 139 09/16/2018   K 4.0 09/16/2018   CL 109 09/07/2018   CO2 18 (L) 09/07/2018   GLUCOSE 123 (H) 09/16/2018   BUN 57 (H) 09/07/2018   CREATININE 7.58 (H) 09/07/2018   CALCIUM 8.8 (L) 09/07/2018   PROT 5.8 (L) 09/18/2015   ALBUMIN 2.9 (L) 09/18/2015   AST 23 09/18/2015   ALT 18 09/18/2015   ALKPHOS 105 09/18/2015   BILITOT 0.4 09/18/2015   GFRNONAA 6 (L) 09/07/2018   GFRAA 7 (L) 09/07/2018    Lab Results  Component Value Date   WBC 8.4 09/07/2018   NEUTROABS 5.0 09/07/2018   HGB 10.9 (L) 09/16/2018   HCT 32.0 (L) 09/16/2018   MCV 122.6 (H) 09/07/2018   PLT 129 (L) 09/07/2018     STUDIES: No  results found.  ASSESSMENT:  Bone marrow biopsy confirmed ITP.  PLAN:    1. ITP: Bone marrow biopsy on August 02, 2015 revealed normal megakaryocytes as well as normal trilineage hematopoiesis thus suggesting peripheral destruction of his platelets.  Infectious workup did not reveal an underlying viral etiology. Patient completed 4 weekly cycles of Rituxan on August 23, 2015.  Patient's platelet count is slowly trending down and is now 98.  He does not require additional Rituxan at this time, but I suspect he will need some in the near future.  Return to clinic in 3 months with repeat laboratory work only and then in 6 months with repeat laboratory work and further evaluation.    2. ESRD: Continue dialysis Mondays, Wednesdays, and Fridays. 3.  Anemia: Mild.  Patient's hemoglobin continues to be greater than 10.0 at 10.2, therefore he does not require Procrit at this time. 4.  Macrocytosis: Previously B12 and folate were within normal limits.  Patient expressed understanding and was in agreement with this plan. He also understands that he can call clinic at any time with any questions, concerns, or complaints.   [name], MD   01/09/2019 10:36 AM

## 2019-01-11 ENCOUNTER — Inpatient Hospital Stay: Payer: Medicare HMO

## 2019-01-11 ENCOUNTER — Inpatient Hospital Stay: Payer: Medicare HMO | Admitting: Oncology

## 2019-01-11 ENCOUNTER — Encounter: Payer: Self-pay | Admitting: Oncology

## 2019-02-12 NOTE — Progress Notes (Signed)
[place]  [phone]) [phone] [phone]) [phone]  ID: [name] OB: 05/19/1930  MR#: [id number]  YKZ#:[id number]  Patient Care Team: Casilda Carls, MD as PCP - General (Internal Medicine)  CHIEF COMPLAINT: Bone marrow biopsy confirmed ITP.  INTERVAL HISTORY: Patient returns to clinic today for repeat laboratory work and further evaluation.  He continues to feel well and remains asymptomatic.  He has increased weakness after his dialysis on Mondays, Wednesdays, and Fridays. He denies any easy bleeding or bruising.  He does not complain of any weakness or fatigue.  He has no neurologic complaints.  He denies any recent fevers or illnesses.  He denies any chest pain, shortness of breath, cough, or hemoptysis.  He denies any nausea, vomiting, constipation, or diarrhea.  Patient feels at his baseline offers no specific complaints today.  REVIEW OF SYSTEMS:   Review of Systems  Constitutional: Negative.  Negative for fever, malaise/fatigue and weight loss.  Respiratory: Negative.  Negative for cough and shortness of breath.   Cardiovascular: Negative.  Negative for chest pain and leg swelling.  Gastrointestinal: Negative.  Negative for blood in stool and melena.  Genitourinary: Negative.  Negative for dysuria and hematuria.  Musculoskeletal: Negative.  Negative for back pain.  Skin: Negative.  Negative for rash.  Neurological: Negative.  Negative for sensory change, focal weakness, weakness and headaches.  Endo/Heme/Allergies: Does not bruise/bleed easily.  Psychiatric/Behavioral: Negative.  The patient is not nervous/anxious.     As per HPI. Otherwise, a complete review of systems is negative.  PAST MEDICAL HISTORY: Past Medical History:  Diagnosis Date  . Arthritis   . Dialysis patient [facility])    Mon, Wed Fri  . Hyperlipidemia   . Hypertension   . Hypothyroidism   . Idiopathic thrombocytopenia purpura ([place])   . Kidney stones   . Recurrent UTI    . Renal agenesis    discovered at age 63  . Type 2 diabetes mellitus ([place])   . Ureteral stricture     PAST SURGICAL HISTORY: Past Surgical History:  Procedure Laterality Date  . A/V FISTULAGRAM Left 03/31/2017   Procedure: A/V Fistulagram;  Surgeon: [name], MD;  Location: [place] [facility];  Service: Cardiovascular;  Laterality: Left;  . A/V FISTULAGRAM Left 09/07/2018   Procedure: A/V FISTULAGRAM;  Surgeon: [name], MD;  Location: [place] [facility];  Service: Cardiovascular;  Laterality: Left;  . A/V SHUNTOGRAM Left 07/28/2017   Procedure: A/V SHUNTOGRAM;  Surgeon: [name], MD;  Location: [place] [facility];  Service: Cardiovascular;  Laterality: Left;  . A/V SHUNTOGRAM Left 03/24/2018   Procedure: A/V SHUNTOGRAM;  Surgeon: [name], MD;  Location: [place] [facility];  Service: Cardiovascular;  Laterality: Left;  . APPENDECTOMY    . AV FISTULA PLACEMENT Left 04/16/2016   Procedure: INSERTION OF ARTERIOVENOUS (AV) GORE-TEX GRAFT ARM;  Surgeon: [name], MD;  Location: [facility];  Service: Vascular;  Laterality: Left;  . CATARACT EXTRACTION W/ INTRAOCULAR LENS IMPLANT Bilateral 2014   done 1-2 months apart at [facility].  [name] FINGER SURGERY Left 2002   pinky finger  . KIDNEY STONE SURGERY    . KYPHOPLASTY [name] 09/16/2018   Procedure: KYPHOPLASTY L3, DIABETIC;  Surgeon: Hessie Knows, MD;  Location: [facility];  Service: Orthopedics;  Laterality: [name];  . NASAL SINUS SURGERY  1985  . PERIPHERAL VASCULAR CATHETERIZATION  05/13/2016   Procedure: Upper Extremity Angiography;  Surgeon: [name], MD;  Location: [place]  [facility];  Service: Cardiovascular;;  . PERIPHERAL VASCULAR CATHETERIZATION  05/13/2016   Procedure: Upper Extremity Intervention;  Surgeon: [name], MD;  Location: [place] [facility];  Service: Cardiovascular;;  . PICC LINE PLACE PERIPHERAL ([place] HX) Right 08/2015  . PICC LINE REMOVAL ([place] HX) Right  09/2015    FAMILY HISTORY: No reported history of malignancy or chronic disease.      ADVANCED DIRECTIVES:    HEALTH MAINTENANCE: Social History   Tobacco Use  . Smoking status: Never Smoker  . Smokeless tobacco: Never Used  Substance Use Topics  . Alcohol use: No    Alcohol/week: 0.0 standard drinks  . Drug use: No     Colonoscopy:  PAP:  Bone density:  Lipid panel:  No Known Allergies  Current Outpatient Medications  Medication Sig Dispense Refill  . ACCU-CHEK AVIVA PLUS test strip 1 each by Other route as directed.     [name] acetaminophen (TYLENOL) 500 MG tablet Take 1,000 mg by mouth every 6 (six) hours as needed for moderate pain or headache.     [name] amLODipine (NORVASC) 5 MG tablet Take 5 mg by mouth daily.    [name] atorvastatin (LIPITOR) 10 MG tablet Take 10 mg by mouth daily.    . B Complex-C-Folic Acid (RENA-VITE PO) Take 1 tablet by mouth daily.    . BD PEN NEEDLE NANO U/F 32G X 4 MM MISC     . Carboxymethylcellulose Sodium (THERATEARS) 0.25 % SOLN Place 1-2 drops into both eyes 3 (three) times daily as needed (for dry eyes).    . cyanocobalamin (,VITAMIN B-12,) 1000 MCG/ML injection Inject 1,000 mcg into the muscle once. Given at PCP    . furosemide (LASIX) 40 MG tablet Take 40 mg by mouth 2 (two) times daily. In the morning and in the afternoon    . hydrALAZINE (APRESOLINE) 25 MG tablet Take 25 mg by mouth 2 (two) times daily.    . insulin detemir (LEVEMIR) 100 UNIT/ML injection Inject 20 Units into the skin daily before breakfast.     . levothyroxine (SYNTHROID, LEVOTHROID) 112 MCG tablet Take 112 mcg by mouth daily before breakfast.    . levothyroxine (SYNTHROID, LEVOTHROID) 125 MCG tablet Take 125 mcg by mouth daily before breakfast.    . lidocaine-prilocaine (EMLA) cream Apply 1 application topically daily as needed (port access).   11  . lisinopril (PRINIVIL,ZESTRIL) 20 MG tablet Take 20 mg by mouth daily.     . multivitamin (RENA-VIT) TABS tablet Take 1 tablet by  mouth daily.    . Omega-3 1000 MG CAPS Take 2,000 mg by mouth 2 (two) times daily.     . traMADol (ULTRAM) 50 MG tablet Take 2 tablets (100 mg total) by mouth every 6 (six) hours as needed for moderate pain or severe pain. 20 tablet 0   No current facility-administered medications for this visit.    Facility-Administered Medications Ordered in Other Visits  Medication Dose Route Frequency Provider Last Rate Last Dose  . heparin lock flush 100 unit/mL  500 Units Intravenous Once [name], MD      . sodium chloride flush (NS) 0.9 % injection 10 mL  10 mL Intravenous PRN [name], MD      . sodium chloride flush (NS) 0.9 % injection 10 mL  10 mL Intravenous PRN [name], MD   10 mL at 08/16/15 0857    OBJECTIVE: Vitals:   02/17/19 1027  BP: 116/65  Pulse: [name])  56  Resp: 18  Temp: 98.9 F (37.2 C)     Body mass index is 26.5 kg/m.    ECOG FS:0 - Asymptomatic  General: Well-developed, well-nourished, no acute distress. Eyes: Pink conjunctiva, anicteric sclera. HEENT: Normocephalic, moist mucous membranes. Lungs: Clear to auscultation bilaterally. Heart: Regular rate and rhythm. No rubs, murmurs, or gallops. Abdomen: Soft, nontender, nondistended. No organomegaly noted, normoactive bowel sounds. Musculoskeletal: No edema, cyanosis, or clubbing. Neuro: Alert, answering all questions appropriately. Cranial nerves grossly intact. Skin: No rashes or petechiae noted. Psych: Normal affect.  LAB RESULTS:  Lab Results  Component Value Date   NA 139 09/16/2018   K 4.0 09/16/2018   CL 109 09/07/2018   CO2 18 (L) 09/07/2018   GLUCOSE 123 (H) 09/16/2018   BUN 57 (H) 09/07/2018   CREATININE 7.58 (H) 09/07/2018   CALCIUM 8.8 (L) 09/07/2018   PROT 5.8 (L) 09/18/2015   ALBUMIN 2.9 (L) 09/18/2015   AST 23 09/18/2015   ALT 18 09/18/2015   ALKPHOS 105 09/18/2015   BILITOT 0.4 09/18/2015   GFRNONAA 6 (L) 09/07/2018   GFRAA 7 (L) 09/07/2018    Lab Results   Component Value Date   WBC 6.2 02/17/2019   NEUTROABS 2.7 02/17/2019   HGB 10.3 (L) 02/17/2019   HCT 30.8 (L) 02/17/2019   MCV 102.3 (H) 02/17/2019   PLT 109 (L) 02/17/2019     STUDIES: No results found.  ASSESSMENT:  Bone marrow biopsy confirmed ITP.  PLAN:    1. ITP: Bone marrow biopsy on August 02, 2015 revealed normal megakaryocytes as well as normal trilineage hematopoiesis thus suggesting peripheral destruction of his platelets.  Infectious workup did not reveal an underlying viral etiology. Patient completed 4 weekly cycles of Rituxan on August 23, 2015.  Patient's platelet count is decreased, but essentially stable at 109.  He does not require additional treatment at this time.  Return to clinic in 3 months for laboratory work only and then in 6 months for laboratory work and further evaluation.    2. ESRD: Continue dialysis Mondays, Wednesdays, and Fridays. 3.  Anemia: Mild.  Hemoglobin 10.3 today.  He does not require Retacrit at this time. 4.  Macrocytosis: Previously B12 and folate were within normal limits.  Patient expressed understanding and was in agreement with this plan. He also understands that he can call clinic at any time with any questions, concerns, or complaints.   [name], MD   02/18/2019 6:35 AM

## 2019-02-17 ENCOUNTER — Inpatient Hospital Stay (HOSPITAL_BASED_OUTPATIENT_CLINIC_OR_DEPARTMENT_OTHER): Payer: Medicare HMO | Admitting: Oncology

## 2019-02-17 ENCOUNTER — Encounter: Payer: Self-pay | Admitting: Oncology

## 2019-02-17 ENCOUNTER — Other Ambulatory Visit: Payer: Self-pay

## 2019-02-17 ENCOUNTER — Inpatient Hospital Stay: Payer: Medicare HMO | Attending: Oncology

## 2019-02-17 VITALS — BP 116/65 | HR 56 | Temp 98.9°F | Resp 18 | Wt 169.2 lb

## 2019-02-17 DIAGNOSIS — D7589 Other specified diseases of blood and blood-forming organs: Secondary | ICD-10-CM | POA: Insufficient documentation

## 2019-02-17 DIAGNOSIS — D693 Immune thrombocytopenic purpura: Secondary | ICD-10-CM | POA: Insufficient documentation

## 2019-02-17 DIAGNOSIS — E1122 Type 2 diabetes mellitus with diabetic chronic kidney disease: Secondary | ICD-10-CM | POA: Insufficient documentation

## 2019-02-17 DIAGNOSIS — E785 Hyperlipidemia, unspecified: Secondary | ICD-10-CM | POA: Diagnosis not present

## 2019-02-17 DIAGNOSIS — N186 End stage renal disease: Secondary | ICD-10-CM | POA: Insufficient documentation

## 2019-02-17 DIAGNOSIS — R531 Weakness: Secondary | ICD-10-CM | POA: Diagnosis not present

## 2019-02-17 DIAGNOSIS — M1990 Unspecified osteoarthritis, unspecified site: Secondary | ICD-10-CM | POA: Insufficient documentation

## 2019-02-17 DIAGNOSIS — D649 Anemia, unspecified: Secondary | ICD-10-CM | POA: Insufficient documentation

## 2019-02-17 DIAGNOSIS — Z79899 Other long term (current) drug therapy: Secondary | ICD-10-CM | POA: Diagnosis not present

## 2019-02-17 DIAGNOSIS — Z992 Dependence on renal dialysis: Secondary | ICD-10-CM | POA: Insufficient documentation

## 2019-02-17 LAB — CBC WITH DIFFERENTIAL/PLATELET
Abs Immature Granulocytes: 0.01 10*3/uL (ref 0.00–0.07)
Basophils Absolute: 0 10*3/uL (ref 0.0–0.1)
Basophils Relative: 1 %
Eosinophils Absolute: 0.5 10*3/uL (ref 0.0–0.5)
Eosinophils Relative: 8 %
HCT: 30.8 % — ABNORMAL LOW (ref 39.0–52.0)
Hemoglobin: 10.3 g/dL — ABNORMAL LOW (ref 13.0–17.0)
Immature Granulocytes: 0 %
Lymphocytes Relative: 37 %
Lymphs Abs: 2.3 10*3/uL (ref 0.7–4.0)
MCH: 34.2 pg — ABNORMAL HIGH (ref 26.0–34.0)
MCHC: 33.4 g/dL (ref 30.0–36.0)
MCV: 102.3 fL — ABNORMAL HIGH (ref 80.0–100.0)
Monocytes Absolute: 0.6 10*3/uL (ref 0.1–1.0)
Monocytes Relative: 10 %
Neutro Abs: 2.7 10*3/uL (ref 1.7–7.7)
Neutrophils Relative %: 44 %
Platelets: 109 10*3/uL — ABNORMAL LOW (ref 150–400)
RBC: 3.01 MIL/uL — ABNORMAL LOW (ref 4.22–5.81)
RDW: 13.1 % (ref 11.5–15.5)
WBC: 6.2 10*3/uL (ref 4.0–10.5)
nRBC: 0 % (ref 0.0–0.2)

## 2019-02-17 NOTE — Progress Notes (Signed)
Patient is getting dialysis on Mon, Wed, Fri.  and  does not offer any problems today.

## 2019-04-01 ENCOUNTER — Ambulatory Visit (INDEPENDENT_AMBULATORY_CARE_PROVIDER_SITE_OTHER): Payer: Medicare HMO

## 2019-04-01 ENCOUNTER — Encounter (INDEPENDENT_AMBULATORY_CARE_PROVIDER_SITE_OTHER): Payer: Self-pay

## 2019-04-01 ENCOUNTER — Other Ambulatory Visit: Payer: Self-pay

## 2019-04-01 ENCOUNTER — Encounter (INDEPENDENT_AMBULATORY_CARE_PROVIDER_SITE_OTHER): Payer: Self-pay | Admitting: Nurse Practitioner

## 2019-04-01 ENCOUNTER — Ambulatory Visit (INDEPENDENT_AMBULATORY_CARE_PROVIDER_SITE_OTHER): Payer: Medicare HMO | Admitting: Nurse Practitioner

## 2019-04-01 VITALS — BP 148/65 | HR 59 | Resp 14 | Ht 67.0 in | Wt 168.0 lb

## 2019-04-01 DIAGNOSIS — T829XXS Unspecified complication of cardiac and vascular prosthetic device, implant and graft, sequela: Secondary | ICD-10-CM | POA: Diagnosis not present

## 2019-04-01 DIAGNOSIS — Z992 Dependence on renal dialysis: Secondary | ICD-10-CM

## 2019-04-01 DIAGNOSIS — E785 Hyperlipidemia, unspecified: Secondary | ICD-10-CM

## 2019-04-01 DIAGNOSIS — N186 End stage renal disease: Secondary | ICD-10-CM | POA: Diagnosis not present

## 2019-04-01 DIAGNOSIS — N185 Chronic kidney disease, stage 5: Secondary | ICD-10-CM

## 2019-04-01 DIAGNOSIS — E1122 Type 2 diabetes mellitus with diabetic chronic kidney disease: Secondary | ICD-10-CM

## 2019-04-03 NOTE — Progress Notes (Signed)
SUBJECTIVE:  Patient ID: Alejandro Armstrong, male    DOB: [birth year], 86 y.o.   MRN: [id number] Chief Complaint  Patient presents with  . Follow-up    ultra sound    HPI  Alejandro Armstrong is a 85 y.o. male  The patient returns to the office for followup of their dialysis access. The function of the access has been stable. The patient denies increased bleeding time or increased recirculation. Patient denies difficulty with cannulation. The patient denies hand pain or other symptoms consistent with steal phenomena.  No significant arm swelling.  The patient denies redness or swelling at the access site. The patient denies fever or chills at home or while on dialysis.  The patient denies amaurosis fugax or recent TIA symptoms. There are no recent neurological changes noted. The patient denies claudication symptoms or rest pain symptoms. The patient denies history of DVT, PE or superficial thrombophlebitis. The patient denies recent episodes of angina or shortness of breath.   Today the patient has improved volume of 1135.  The AV graft appears to be patent throughout.  There are some diameter changes in the proximal to mid segment however velocity changes are not hemodynamically significant.     Past Medical History:  Diagnosis Date  . Arthritis   . Dialysis patient [facility])    Mon, Wed Fri  . Hyperlipidemia   . Hypertension   . Hypothyroidism   . Idiopathic thrombocytopenia purpura ([place])   . Kidney stones   . Recurrent UTI   . Renal agenesis    discovered at age 72  . Type 2 diabetes mellitus ([place])   . Ureteral stricture     Past Surgical History:  Procedure Laterality Date  . A/V FISTULAGRAM Left 03/31/2017   Procedure: A/V Fistulagram;  Surgeon: [name], MD;  Location: [place] [facility];  Service: Cardiovascular;  Laterality: Left;  . A/V FISTULAGRAM Left 09/07/2018   Procedure: A/V FISTULAGRAM;  Surgeon: [name], MD;  Location: [place]  [facility];  Service: Cardiovascular;  Laterality: Left;  . A/V SHUNTOGRAM Left 07/28/2017   Procedure: A/V SHUNTOGRAM;  Surgeon: [name], MD;  Location: [place] [facility];  Service: Cardiovascular;  Laterality: Left;  . A/V SHUNTOGRAM Left 03/24/2018   Procedure: A/V SHUNTOGRAM;  Surgeon: [name], MD;  Location: [place] [facility];  Service: Cardiovascular;  Laterality: Left;  . APPENDECTOMY    . AV FISTULA PLACEMENT Left 04/16/2016   Procedure: INSERTION OF ARTERIOVENOUS (AV) GORE-TEX GRAFT ARM;  Surgeon: [name], MD;  Location: [facility] ORS;  Service: Vascular;  Laterality: Left;  . CATARACT EXTRACTION W/ INTRAOCULAR LENS IMPLANT Bilateral 2014   done 1-2 months apart at [facility]  [name] FINGER SURGERY Left 2002   pinky finger  . KIDNEY STONE SURGERY    . KYPHOPLASTY [name] 09/16/2018   Procedure: KYPHOPLASTY L3, DIABETIC;  Surgeon: Hessie Knows, MD;  Location: [facility] ORS;  Service: Orthopedics;  Laterality: [name];  . NASAL SINUS SURGERY  1985  . PERIPHERAL VASCULAR CATHETERIZATION  05/13/2016   Procedure: Upper Extremity Angiography;  Surgeon: [name], MD;  Location: [place] [facility];  Service: Cardiovascular;;  . PERIPHERAL VASCULAR CATHETERIZATION  05/13/2016   Procedure: Upper Extremity Intervention;  Surgeon: [name], MD;  Location: [place] [facility];  Service: Cardiovascular;;  . PICC LINE PLACE PERIPHERAL ([place] HX) Right 08/2015  . PICC LINE REMOVAL ([place] HX) Right 09/2015    Social History  Socioeconomic History  . Marital status: Married    Spouse name: Not on file  . Number of children: Not on file  . Years of education: Not on file  . Highest education level: Not on file  Occupational History  . Occupation: retired  Scientific laboratory technician  . Financial resource strain: Not on file  . Food insecurity    Worry: Not on file    Inability: Not on file  . Transportation needs    Medical: Not on file    Non-medical: Not on file  Tobacco Use   . Smoking status: Never Smoker  . Smokeless tobacco: Never Used  Substance and Sexual Activity  . Alcohol use: No    Alcohol/week: 0.0 standard drinks  . Drug use: No  . Sexual activity: Never  Lifestyle  . Physical activity    Days per week: Not on file    Minutes per session: Not on file  . Stress: Not on file  Relationships  . Social Herbalist on phone: Not on file    Gets together: Not on file    Attends religious service: Not on file    Active member of club or organization: Not on file    Attends meetings of clubs or organizations: Not on file    Relationship status: Not on file  . Intimate partner violence    Fear of current or ex partner: Not on file    Emotionally abused: Not on file    Physically abused: Not on file    Forced sexual activity: Not on file  Other Topics Concern  . Not on file  Social History Narrative  . Not on file    Family History  Problem Relation Age of Onset  . Cervical cancer Sister     No Known Allergies   Review of Systems   Review of Systems: Negative Unless Checked Constitutional: [] Weight loss  [] Fever  [] Chills Cardiac: [] Chest pain   []  Atrial Fibrillation  [] Palpitations   [] Shortness of breath when laying flat   [] Shortness of breath with exertion. [] Shortness of breath at rest Vascular:  [] Pain in legs with walking   [] Pain in legs with standing [] Pain in legs when laying flat   [] Claudication    [] Pain in feet when laying flat    [] History of DVT   [] Phlebitis   [] Swelling in legs   [] Varicose veins   [] Non-healing ulcers Pulmonary:   [] Uses home oxygen   [] Productive cough   [] Hemoptysis   [] Wheeze  [] COPD   [] Asthma Neurologic:  [] Dizziness   [] Seizures  [] Blackouts [] History of stroke   [] History of TIA  [] Aphasia   [] Temporary Blindness   [] Weakness or numbness in arm   [] Weakness or numbness in leg Musculoskeletal:   [] Joint swelling   [] Joint pain   [] Low back pain  []  History of Knee Replacement [] Arthritis  [] back Surgeries  []  Spinal Stenosis    Hematologic:  [] Easy bruising  [x] Easy bleeding   [] Hypercoagulable state   [x] Anemic Gastrointestinal:  [] Diarrhea   [] Vomiting  [] Gastroesophageal reflux/heartburn   [] Difficulty swallowing. [] Abdominal pain Genitourinary:  [x] Chronic kidney disease   [] Difficult urination  [] Anuric   [] Blood in urine [] Frequent urination  [] Burning with urination   [] Hematuria Skin:  [] Rashes   [] Ulcers [] Wounds Psychological:  [] History of anxiety   []  History of major depression  []  Memory Difficulties      OBJECTIVE:   Physical Exam  BP (!) 148/65 (BP Location: Right Arm)  Pulse (!) 59   Resp 14   Ht 5\' 7"  (1.702 m)   Wt 168 lb (76.2 kg)   BMI 26.31 kg/m   Gen: WD/WN, NAD Head: [place]/AT, No temporalis wasting.  Ear/Nose/Throat: Hearing grossly intact, nares w/o erythema or drainage Eyes: PER, EOMI, sclera nonicteric.  Neck: Supple, no masses.  No JVD.  Pulmonary:  Good air movement, no use of accessory muscles.  Cardiac: RRR Vascular: good thrill and bruit Vessel Right Left  Radial Palpable Palpable   Gastrointestinal: soft, non-distended. No guarding/no peritoneal signs.  Musculoskeletal: M/S 5/5 throughout.  No deformity or atrophy.  Neurologic: Pain and light touch intact in extremities.  Symmetrical.  Speech is fluent. Motor exam as listed above. Psychiatric: Judgment intact, Mood & affect appropriate for pt's clinical situation. Dermatologic: No Venous rashes. No Ulcers Noted.  No changes consistent with cellulitis. Lymph : No Cervical lymphadenopathy, no lichenification or skin changes of chronic lymphedema.       ASSESSMENT AND PLAN:  1. ESRD on dialysis [facility]) Recommend:  The patient is doing well and currently has adequate dialysis access. The patient's dialysis center is not reporting any access issues. Flow pattern is stable when compared to the prior ultrasound.  The patient should have a duplex ultrasound of the dialysis access  in 6 months. The patient will follow-up with me in the office after each ultrasound    - VAS [place] (AVF, AVG); Future  2. Type 2 diabetes mellitus with stage 5 chronic kidney disease not on chronic dialysis, without long-term current use of insulin (HCC) Continue hypoglycemic medications as already ordered, these medications have been reviewed and there are no changes at this time.  Hgb A1C to be monitored as already arranged by primary service   3. Hyperlipidemia, unspecified hyperlipidemia type Continue statin as ordered and reviewed, no changes at this time    Current Outpatient Medications on File Prior to Visit  Medication Sig Dispense Refill  . ACCU-CHEK AVIVA PLUS test strip 1 each by Other route as directed.     [name] acetaminophen (TYLENOL) 500 MG tablet Take 1,000 mg by mouth every 6 (six) hours as needed for moderate pain or headache.     [name] amLODipine (NORVASC) 5 MG tablet Take 5 mg by mouth daily.    [name] atorvastatin (LIPITOR) 10 MG tablet Take 10 mg by mouth daily.    . B Complex-C-Folic Acid (RENA-VITE PO) Take 1 tablet by mouth daily.    . BD PEN NEEDLE NANO U/F 32G X 4 MM MISC     . Carboxymethylcellulose Sodium (THERATEARS) 0.25 % SOLN Place 1-2 drops into both eyes 3 (three) times daily as needed (for dry eyes).    . cyanocobalamin (,VITAMIN B-12,) 1000 MCG/ML injection Inject 1,000 mcg into the muscle once. Given at PCP    . furosemide (LASIX) 40 MG tablet Take 40 mg by mouth 2 (two) times daily. In the morning and in the afternoon    . hydrALAZINE (APRESOLINE) 25 MG tablet Take 25 mg by mouth 2 (two) times daily.    . insulin detemir (LEVEMIR) 100 UNIT/ML injection Inject 20 Units into the skin daily before breakfast.     . levothyroxine (SYNTHROID, LEVOTHROID) 112 MCG tablet Take 112 mcg by mouth daily before breakfast.    . levothyroxine (SYNTHROID, LEVOTHROID) 125 MCG tablet Take 125 mcg by mouth daily before breakfast.    . lidocaine-prilocaine  (EMLA) cream Apply 1 application topically daily as needed (port access).  11  . lisinopril (PRINIVIL,ZESTRIL) 20 MG tablet Take 20 mg by mouth daily.     . multivitamin (RENA-VIT) TABS tablet Take 1 tablet by mouth daily.    . Omega-3 1000 MG CAPS Take 2,000 mg by mouth 2 (two) times daily.     . traMADol (ULTRAM) 50 MG tablet Take 2 tablets (100 mg total) by mouth every 6 (six) hours as needed for moderate pain or severe pain. 20 tablet 0   Current Facility-Administered Medications on File Prior to Visit  Medication Dose Route Frequency Provider Last Rate Last Dose  . heparin lock flush 100 unit/mL  500 Units Intravenous Once [name], MD      . sodium chloride flush (NS) 0.9 % injection 10 mL  10 mL Intravenous PRN [name], MD      . sodium chloride flush (NS) 0.9 % injection 10 mL  10 mL Intravenous PRN [name], MD   10 mL at 08/16/15 0857    There are no Patient Instructions on file for this visit. No follow-ups on file.   Kris Hartmann, NP  This note was completed with Sales executive.  Any errors are purely unintentional.

## 2019-05-23 ENCOUNTER — Other Ambulatory Visit: Payer: Medicare HMO

## 2019-05-24 ENCOUNTER — Other Ambulatory Visit: Payer: Self-pay

## 2019-05-24 ENCOUNTER — Inpatient Hospital Stay: Payer: Medicare HMO | Attending: Oncology

## 2019-05-24 DIAGNOSIS — D693 Immune thrombocytopenic purpura: Secondary | ICD-10-CM | POA: Insufficient documentation

## 2019-05-24 LAB — CBC WITH DIFFERENTIAL/PLATELET
Abs Immature Granulocytes: 0.01 10*3/uL (ref 0.00–0.07)
Basophils Absolute: 0 10*3/uL (ref 0.0–0.1)
Basophils Relative: 1 %
Eosinophils Absolute: 0.4 10*3/uL (ref 0.0–0.5)
Eosinophils Relative: 9 %
HCT: 30.3 % — ABNORMAL LOW (ref 39.0–52.0)
Hemoglobin: 9.6 g/dL — ABNORMAL LOW (ref 13.0–17.0)
Immature Granulocytes: 0 %
Lymphocytes Relative: 36 %
Lymphs Abs: 1.7 10*3/uL (ref 0.7–4.0)
MCH: 34.2 pg — ABNORMAL HIGH (ref 26.0–34.0)
MCHC: 31.7 g/dL (ref 30.0–36.0)
MCV: 107.8 fL — ABNORMAL HIGH (ref 80.0–100.0)
Monocytes Absolute: 0.5 10*3/uL (ref 0.1–1.0)
Monocytes Relative: 9 %
Neutro Abs: 2.2 10*3/uL (ref 1.7–7.7)
Neutrophils Relative %: 45 %
Platelets: 132 10*3/uL — ABNORMAL LOW (ref 150–400)
RBC: 2.81 MIL/uL — ABNORMAL LOW (ref 4.22–5.81)
RDW: 13.3 % (ref 11.5–15.5)
WBC: 4.8 10*3/uL (ref 4.0–10.5)
nRBC: 0 % (ref 0.0–0.2)

## 2019-08-21 NOTE — Progress Notes (Signed)
[place]  [phone] [phone]) [phone]  ID: [name] OB: [birth year]  MR#: [id number]  LOV#:[id number]  Patient Care Team: Casilda Carls, MD as PCP - General (Internal Medicine)  CHIEF COMPLAINT: Bone marrow biopsy confirmed ITP.  INTERVAL HISTORY: Patient returns to clinic today for repeat laboratory and routine 56-monthevaluation.  He continues to feel well and remains asymptomatic.  He has increased weakness after his dialysis on Mondays, Wednesdays, and Fridays. He denies any easy bleeding or bruising. He has no neurologic complaints.  He denies any recent fevers or illnesses.  He denies any chest pain, shortness of breath, cough, or hemoptysis.  He denies any nausea, vomiting, constipation, or diarrhea.  Patient offers no specific complaints today.  REVIEW OF SYSTEMS:   Review of Systems  Constitutional: Negative.  Negative for fever, malaise/fatigue and weight loss.  Respiratory: Negative.  Negative for cough and shortness of breath.   Cardiovascular: Negative.  Negative for chest pain and leg swelling.  Gastrointestinal: Negative.  Negative for blood in stool and melena.  Genitourinary: Negative.  Negative for dysuria and hematuria.  Musculoskeletal: Negative.  Negative for back pain.  Skin: Negative.  Negative for rash.  Neurological: Negative.  Negative for sensory change, focal weakness, weakness and headaches.  Endo/Heme/Allergies: Does not bruise/bleed easily.  Psychiatric/Behavioral: Negative.  The patient is not nervous/anxious.     As per HPI. Otherwise, a complete review of systems is negative.  PAST MEDICAL HISTORY: Past Medical History:  Diagnosis Date  . Arthritis   . Dialysis patient ([facility]    Mon, Wed Fri  . Hyperlipidemia   . Hypertension   . Hypothyroidism   . [place]   . Kidney stones   . Recurrent UTI   . Renal agenesis    discovered at age 86 . Type 2 diabetes mellitus  ([place]   . Ureteral stricture     PAST SURGICAL HISTORY: Past Surgical History:  Procedure Laterality Date  . A/V FISTULAGRAM Left 03/31/2017   Procedure: A/V Fistulagram;  Surgeon: SKatha Cabal MD;  [place];  Service: Cardiovascular;  Laterality: Left;  . A/V FISTULAGRAM Left 09/07/2018   Procedure: A/V FISTULAGRAM;  Surgeon: SKatha Cabal MD;  [place] [facility];  Service: Cardiovascular;  Laterality: Left;  . A/V SHUNTOGRAM Left 07/28/2017   Procedure: A/V SHUNTOGRAM;  Surgeon: SKatha Cabal MD;  [place];  Service: Cardiovascular;  Laterality: Left;  . A/V SHUNTOGRAM Left 03/24/2018   Procedure: A/V SHUNTOGRAM;  Surgeon: SKatha Cabal MD;  [place];  Service: Cardiovascular;  Laterality: Left;  . APPENDECTOMY    . AV FISTULA PLACEMENT Left 04/16/2016   Procedure: INSERTION OF ARTERIOVENOUS (AV) GORE-TEX GRAFT ARM;  Surgeon: GKatha Cabal MD;  Location: [facility] ORS;  Service: Vascular;  Laterality: Left;  . CATARACT EXTRACTION W/ INTRAOCULAR LENS IMPLANT Bilateral 2014   [facility]  .Marland KitchenFINGER SURGERY Left 2002   pinky finger  . KIDNEY STONE SURGERY    . KYPHOPLASTY [name] 09/16/2018   Procedure: KYPHOPLASTY L3, DIABETIC;  Surgeon: MHessie Knows MD;  Location: [facility] ORS;  Service: Orthopedics;  Laterality: [name];  . NASAL SINUS SURGERY  1985  . PERIPHERAL VASCULAR CATHETERIZATION  05/13/2016   Procedure: Upper Extremity Angiography;  Surgeon: GKatha Cabal MD;  [place];  Service: Cardiovascular;;  . PERIPHERAL VASCULAR CATHETERIZATION  05/13/2016   Procedure:  Upper Extremity Intervention;  Surgeon: [name], MD;  Location: [place] [facility];  Service: Cardiovascular;;  . PICC LINE PLACE PERIPHERAL ([place] HX) Right 08/2015  . PICC LINE REMOVAL ([place] HX) Right 09/2015    FAMILY HISTORY: No reported history of malignancy or chronic  disease.      ADVANCED DIRECTIVES:    HEALTH MAINTENANCE: Social History   Tobacco Use  . Smoking status: Never Smoker  . Smokeless tobacco: Never Used  Substance Use Topics  . Alcohol use: No    Alcohol/week: 0.0 standard drinks  . Drug use: No     Colonoscopy:  PAP:  Bone density:  Lipid panel:  No Known Allergies  Current Outpatient Medications  Medication Sig Dispense Refill  . acetaminophen (TYLENOL) 500 MG tablet Take 1,000 mg by mouth every 6 (six) hours as needed for moderate pain or headache.     [name] amLODipine (NORVASC) 5 MG tablet Take 5 mg by mouth daily.    [name] atorvastatin (LIPITOR) 10 MG tablet Take 10 mg by mouth daily.    . B Complex-C-Folic Acid (RENA-VITE PO) Take 1 tablet by mouth daily.    . Carboxymethylcellulose Sodium (THERATEARS) 0.25 % SOLN Place 1-2 drops into both eyes 3 (three) times daily as needed (for dry eyes).    . Cholecalciferol (VITAMIN D3) 1.25 MG (50000 UT) CAPS Take by mouth once a week.    . cyanocobalamin (,VITAMIN B-12,) 1000 MCG/ML injection Inject 1,000 mcg into the muscle once. Given at PCP    . furosemide (LASIX) 40 MG tablet Take 40 mg by mouth 2 (two) times daily. In the morning and in the afternoon    . hydrALAZINE (APRESOLINE) 25 MG tablet Take 25 mg by mouth 2 (two) times daily.    [name] levothyroxine (SYNTHROID) 112 MCG tablet Take 112 mcg by mouth daily before breakfast.    . lidocaine-prilocaine (EMLA) cream Apply 1 application topically daily as needed (port access).   11  . lisinopril (PRINIVIL,ZESTRIL) 20 MG tablet Take 20 mg by mouth daily.     . multivitamin (RENA-VIT) TABS tablet Take 1 tablet by mouth daily.    . Omega-3 1000 MG CAPS Take 2,000 mg by mouth 2 (two) times daily.      No current facility-administered medications for this visit.   Facility-Administered Medications Ordered in Other Visits  Medication Dose Route Frequency Provider Last Rate Last Admin  . heparin lock flush 100 unit/mL  500 Units Intravenous  Once [name], MD      . sodium chloride flush (NS) 0.9 % injection 10 mL  10 mL Intravenous PRN [name], MD      . sodium chloride flush (NS) 0.9 % injection 10 mL  10 mL Intravenous PRN [name], MD   10 mL at 08/16/15 0857    OBJECTIVE: Vitals:   08/25/19 1419  BP: 138/74  Pulse: 73  Resp: 18  Temp: 97.7 F (36.5 C)  SpO2: 100%     Body mass index is 26 kg/m.    ECOG FS:0 - Asymptomatic  General: Well-developed, well-nourished, no acute distress. Eyes: Pink conjunctiva, anicteric sclera. HEENT: Normocephalic, moist mucous membranes. Lungs: No audible wheezing or coughing. Heart: Regular rate and rhythm. Abdomen: Soft, nontender, no obvious distention. Musculoskeletal: No edema, cyanosis, or clubbing. Neuro: Alert, answering all questions appropriately. Cranial nerves grossly intact. Skin: No rashes or petechiae noted. Psych: Normal affect.  LAB RESULTS:  Lab Results  Component Value Date  NA 139 09/16/2018   K 4.0 09/16/2018   CL 109 09/07/2018   CO2 18 (L) 09/07/2018   GLUCOSE 123 (H) 09/16/2018   BUN 57 (H) 09/07/2018   CREATININE 7.58 (H) 09/07/2018   CALCIUM 8.8 (L) 09/07/2018   PROT 5.8 (L) 09/18/2015   ALBUMIN 2.9 (L) 09/18/2015   AST 23 09/18/2015   ALT 18 09/18/2015   ALKPHOS 105 09/18/2015   BILITOT 0.4 09/18/2015   GFRNONAA 6 (L) 09/07/2018   GFRAA 7 (L) 09/07/2018    Lab Results  Component Value Date   WBC 5.1 08/25/2019   NEUTROABS 2.4 08/25/2019   HGB 10.1 (L) 08/25/2019   HCT 32.0 (L) 08/25/2019   MCV 105.3 (H) 08/25/2019   PLT 117 (L) 08/25/2019     STUDIES: No results found.  ASSESSMENT:  Bone marrow biopsy confirmed ITP.  PLAN:    1. ITP: Bone marrow biopsy on August 02, 2015 revealed normal megakaryocytes as well as normal trilineage hematopoiesis thus suggesting peripheral destruction of his platelets.  Infectious workup did not reveal an underlying viral etiology. Patient completed 4 weekly  cycles of Rituxan on August 23, 2015.  Patient's platelet count remains decreased, but essentially at his baseline of approximately 117.  He does not require any additional treatment at this time.  Return to clinic in 6 months for laboratory work only and then in 1 year for laboratory work and further evaluation.   2. ESRD: Continue dialysis Mondays, Wednesdays, and Fridays. 3.  Anemia: Mild.  Hemoglobin 10.1 today.  He does not require Retacrit at this time. 4.  Macrocytosis: Previously B12 and folate were within normal limits.  Patient expressed understanding and was in agreement with this plan. He also understands that he can call clinic at any time with any questions, concerns, or complaints.   [name], MD   08/25/2019 4:09 PM

## 2019-08-24 ENCOUNTER — Encounter: Payer: Self-pay | Admitting: Oncology

## 2019-08-24 ENCOUNTER — Other Ambulatory Visit: Payer: Self-pay

## 2019-08-24 ENCOUNTER — Other Ambulatory Visit: Payer: Self-pay | Admitting: *Deleted

## 2019-08-24 DIAGNOSIS — D693 Immune thrombocytopenic purpura: Secondary | ICD-10-CM

## 2019-08-24 NOTE — Progress Notes (Signed)
Pre assessment call completed, information given by patients wife. Patient and family do not have any concerns.

## 2019-08-25 ENCOUNTER — Inpatient Hospital Stay (HOSPITAL_BASED_OUTPATIENT_CLINIC_OR_DEPARTMENT_OTHER): Payer: Medicare HMO | Admitting: Oncology

## 2019-08-25 ENCOUNTER — Inpatient Hospital Stay: Payer: Medicare HMO | Attending: Oncology

## 2019-08-25 ENCOUNTER — Encounter: Payer: Self-pay | Admitting: Oncology

## 2019-08-25 ENCOUNTER — Other Ambulatory Visit: Payer: Self-pay

## 2019-08-25 VITALS — BP 138/74 | HR 73 | Temp 97.7°F | Resp 18 | Wt 166.0 lb

## 2019-08-25 DIAGNOSIS — D693 Immune thrombocytopenic purpura: Secondary | ICD-10-CM

## 2019-08-25 DIAGNOSIS — N186 End stage renal disease: Secondary | ICD-10-CM | POA: Insufficient documentation

## 2019-08-25 DIAGNOSIS — E785 Hyperlipidemia, unspecified: Secondary | ICD-10-CM | POA: Insufficient documentation

## 2019-08-25 DIAGNOSIS — R531 Weakness: Secondary | ICD-10-CM | POA: Diagnosis not present

## 2019-08-25 DIAGNOSIS — D7589 Other specified diseases of blood and blood-forming organs: Secondary | ICD-10-CM | POA: Diagnosis not present

## 2019-08-25 DIAGNOSIS — Z87442 Personal history of urinary calculi: Secondary | ICD-10-CM | POA: Diagnosis not present

## 2019-08-25 DIAGNOSIS — D649 Anemia, unspecified: Secondary | ICD-10-CM | POA: Insufficient documentation

## 2019-08-25 DIAGNOSIS — E1122 Type 2 diabetes mellitus with diabetic chronic kidney disease: Secondary | ICD-10-CM | POA: Diagnosis not present

## 2019-08-25 DIAGNOSIS — Z992 Dependence on renal dialysis: Secondary | ICD-10-CM | POA: Insufficient documentation

## 2019-08-25 DIAGNOSIS — Z79899 Other long term (current) drug therapy: Secondary | ICD-10-CM | POA: Diagnosis not present

## 2019-08-25 DIAGNOSIS — Z87440 Personal history of urinary (tract) infections: Secondary | ICD-10-CM | POA: Insufficient documentation

## 2019-08-25 LAB — CBC WITH DIFFERENTIAL/PLATELET
Abs Immature Granulocytes: 0.01 10*3/uL (ref 0.00–0.07)
Basophils Absolute: 0 10*3/uL (ref 0.0–0.1)
Basophils Relative: 0 %
Eosinophils Absolute: 0.5 10*3/uL (ref 0.0–0.5)
Eosinophils Relative: 9 %
HCT: 32 % — ABNORMAL LOW (ref 39.0–52.0)
Hemoglobin: 10.1 g/dL — ABNORMAL LOW (ref 13.0–17.0)
Immature Granulocytes: 0 %
Lymphocytes Relative: 34 %
Lymphs Abs: 1.7 10*3/uL (ref 0.7–4.0)
MCH: 33.2 pg (ref 26.0–34.0)
MCHC: 31.6 g/dL (ref 30.0–36.0)
MCV: 105.3 fL — ABNORMAL HIGH (ref 80.0–100.0)
Monocytes Absolute: 0.5 10*3/uL (ref 0.1–1.0)
Monocytes Relative: 9 %
Neutro Abs: 2.4 10*3/uL (ref 1.7–7.7)
Neutrophils Relative %: 48 %
Platelets: 117 10*3/uL — ABNORMAL LOW (ref 150–400)
RBC: 3.04 MIL/uL — ABNORMAL LOW (ref 4.22–5.81)
RDW: 13.8 % (ref 11.5–15.5)
WBC: 5.1 10*3/uL (ref 4.0–10.5)
nRBC: 0 % (ref 0.0–0.2)

## 2019-10-06 ENCOUNTER — Encounter (INDEPENDENT_AMBULATORY_CARE_PROVIDER_SITE_OTHER): Payer: Self-pay | Admitting: Vascular Surgery

## 2019-10-06 ENCOUNTER — Ambulatory Visit (INDEPENDENT_AMBULATORY_CARE_PROVIDER_SITE_OTHER): Payer: Medicare HMO

## 2019-10-06 ENCOUNTER — Ambulatory Visit (INDEPENDENT_AMBULATORY_CARE_PROVIDER_SITE_OTHER): Payer: Medicare HMO | Admitting: Vascular Surgery

## 2019-10-06 ENCOUNTER — Other Ambulatory Visit: Payer: Self-pay

## 2019-10-06 VITALS — BP 130/74 | HR 62 | Resp 16 | Ht 67.0 in | Wt 166.0 lb

## 2019-10-06 DIAGNOSIS — E785 Hyperlipidemia, unspecified: Secondary | ICD-10-CM

## 2019-10-06 DIAGNOSIS — N186 End stage renal disease: Secondary | ICD-10-CM

## 2019-10-06 DIAGNOSIS — E1122 Type 2 diabetes mellitus with diabetic chronic kidney disease: Secondary | ICD-10-CM | POA: Diagnosis not present

## 2019-10-06 DIAGNOSIS — N185 Chronic kidney disease, stage 5: Secondary | ICD-10-CM

## 2019-10-06 DIAGNOSIS — Z992 Dependence on renal dialysis: Secondary | ICD-10-CM | POA: Diagnosis not present

## 2019-10-06 DIAGNOSIS — T829XXS Unspecified complication of cardiac and vascular prosthetic device, implant and graft, sequela: Secondary | ICD-10-CM

## 2019-10-06 NOTE — Progress Notes (Signed)
MRN : [id number]  Alejandro Armstrong is a 86 y.o. (02/21/1930) male who presents with chief complaint of No chief complaint on file. [name]  History of Present Illness:   The patient returns to the office for followup status post intervention of the dialysis access 09/07/2018.  Since the intervention the access function has been stable, with better flow rates and improved KT/V. The patient has not been experiencing increased bleeding times following decannulation and the patient denies increased recirculation. The patient denies an increase in arm swelling. At the present time the patient denies hand pain.  The patient denies amaurosis fugax or recent TIA symptoms. There are no recent neurological changes noted. The patient denies claudication symptoms or rest pain symptoms. The patient denies history of DVT, PE or superficial thrombophlebitis. The patient denies recent episodes of angina or shortness of breath.   Duplex ultrasound of the AV access shows a patent access.  The previously noted stenosis is unchanged compared to last study. Volume flow is 1500     No outpatient medications have been marked as taking for the 10/06/19 encounter (Appointment) with Delana Meyer, Dolores Lory, MD.    Past Medical History:  Diagnosis Date  . Arthritis   . Dialysis patient [facility])    Mon, Wed Fri  . Hyperlipidemia   . Hypertension   . Hypothyroidism   . Idiopathic thrombocytopenia purpura ([place])   . Kidney stones   . Recurrent UTI   . 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 Type 2 diabetes mellitus ([place])   . Ureteral stricture     Past Surgical History:  Procedure Laterality Date  . A/V FISTULAGRAM Left 03/31/2017   Procedure: A/V Fistulagram;  Surgeon: [name], MD;  Location: [place] [facility];  Service: Cardiovascular;  Laterality: Left;  . A/V FISTULAGRAM Left 09/07/2018   Procedure: A/V FISTULAGRAM;  Surgeon: [name], MD;  Location: [place] [facility];  Service:  Cardiovascular;  Laterality: Left;  . A/V SHUNTOGRAM Left 07/28/2017   Procedure: A/V SHUNTOGRAM;  Surgeon: [name], MD;  Location: [place] [facility];  Service: Cardiovascular;  Laterality: Left;  . A/V SHUNTOGRAM Left 03/24/2018   Procedure: A/V SHUNTOGRAM;  Surgeon: [name], MD;  Location: [place] [facility];  Service: Cardiovascular;  Laterality: Left;  . APPENDECTOMY    . AV FISTULA PLACEMENT Left 04/16/2016   Procedure: INSERTION OF ARTERIOVENOUS (AV) GORE-TEX GRAFT ARM;  Surgeon: [name], MD;  Location: [facility];  Service: Vascular;  Laterality: Left;  . CATARACT EXTRACTION W/ INTRAOCULAR LENS IMPLANT Bilateral 2014   done 1-2 months apart at [facility].  [name] FINGER SURGERY Left 2002   pinky finger  . KIDNEY STONE SURGERY    . KYPHOPLASTY [name] 09/16/2018   Procedure: KYPHOPLASTY L3, DIABETIC;  Surgeon: Hessie Knows, MD;  Location: [facility];  Service: Orthopedics;  Laterality: [name];  . NASAL SINUS SURGERY  1985  . PERIPHERAL VASCULAR CATHETERIZATION  05/13/2016   Procedure: Upper Extremity Angiography;  Surgeon: [name], MD;  Location: [place] [facility];  Service: Cardiovascular;;  . PERIPHERAL VASCULAR CATHETERIZATION  05/13/2016   Procedure: Upper Extremity Intervention;  Surgeon: [name], MD;  Location: [place] [facility];  Service: Cardiovascular;;  . PICC LINE PLACE PERIPHERAL ([place] HX) Right 08/2015  . PICC LINE REMOVAL ([place] HX) Right 09/2015    Social History Social History   Tobacco Use  . Smoking status: Never Smoker  .  Smokeless tobacco: Never Used  Substance Use Topics  . Alcohol use: No    Alcohol/week: 0.0 standard drinks  . Drug use: No    Family History Family History  Problem Relation Age of Onset  . Cervical cancer Sister     No Known Allergies   REVIEW OF SYSTEMS (Negative unless checked)  Constitutional: [] Weight loss  [] Fever  [] Chills Cardiac: [] Chest pain   [] Chest pressure   [] Palpitations    [] Shortness of breath when laying flat   [] Shortness of breath with exertion. Vascular:  [] Pain in legs with walking   [] Pain in legs at rest  [] History of DVT   [] Phlebitis   [] Swelling in legs   [] Varicose veins   [] Non-healing ulcers Pulmonary:   [] Uses home oxygen   [] Productive cough   [] Hemoptysis   [] Wheeze  [] COPD   [] Asthma Neurologic:  [] Dizziness   [] Seizures   [] History of stroke   [] History of TIA  [] Aphasia   [] Vissual changes   [] Weakness or numbness in arm   [] Weakness or numbness in leg Musculoskeletal:   [] Joint swelling   [] Joint pain   [] Low back pain Hematologic:  [x] Easy bruising  [] Easy bleeding   [] Hypercoagulable state   [] Anemic Gastrointestinal:  [] Diarrhea   [] Vomiting  [] Gastroesophageal reflux/heartburn   [] Difficulty swallowing. Genitourinary:  [x] Chronic kidney disease   [] Difficult urination  [] Frequent urination   [] Blood in urine Skin:  [] Rashes   [] Ulcers  Psychological:  [] History of anxiety   []  History of major depression.  Physical Examination  There were no vitals filed for this visit. There is no height or weight on file to calculate BMI. Gen: WD/WN, NAD Head: [place]/AT, No temporalis wasting.  Ear/Nose/Throat: Hearing grossly intact, nares w/o erythema or drainage Eyes: PER, EOMI, sclera nonicteric.  Neck: Supple, no large masses.   Pulmonary:  Good air movement, no audible wheezing bilaterally, no use of accessory muscles.  Cardiac: RRR, no JVD Vascular: left brachial axillary graft good thrill good bruit Vessel Right Left  Radial Palpable Palpable  Gastrointestinal: Non-distended. No guarding/no peritoneal signs.  Musculoskeletal: M/S 5/5 throughout.  No deformity or atrophy.  Neurologic: CN 2-12 intact. Symmetrical.  Speech is fluent. Motor exam as listed above. Psychiatric: Judgment intact, Mood & affect appropriate for pt's clinical situation. Dermatologic: multiple bruises No rashes or ulcers noted.  No changes consistent with  cellulitis.  CBC Lab Results  Component Value Date   WBC 5.1 08/25/2019   HGB 10.1 (L) 08/25/2019   HCT 32.0 (L) 08/25/2019   MCV 105.3 (H) 08/25/2019   PLT 117 (L) 08/25/2019    BMET    Component Value Date/Time   NA 139 09/16/2018 0829   NA 137 10/15/2012 2113   K 4.0 09/16/2018 0829   K 4.2 10/15/2012 2113   CL 109 09/07/2018 0205   CL 110 (H) 10/15/2012 2113   CO2 18 (L) 09/07/2018 0205   CO2 16 (L) 10/15/2012 2113   GLUCOSE 123 (H) 09/16/2018 0829   GLUCOSE 142 (H) 10/15/2012 2113   BUN 57 (H) 09/07/2018 0205   BUN 51 (H) 10/15/2012 2113   CREATININE 7.58 (H) 09/07/2018 0205   CREATININE 3.24 (H) 10/15/2012 2113   CALCIUM 8.8 (L) 09/07/2018 0205   CALCIUM 8.7 10/15/2012 2113   GFRNONAA 6 (L) 09/07/2018 0205   GFRNONAA 17 (L) 10/15/2012 2113   GFRAA 7 (L) 09/07/2018 0205   GFRAA 20 (L) 10/15/2012 2113   CrCl cannot be calculated (Patient's most recent lab result is  older than the maximum 21 days allowed.).  COAG Lab Results  Component Value Date   INR 1.0 09/07/2018   INR 0.94 04/02/2016   INR 1.96 12/17/2015    Radiology No results found.   Assessment/Plan 1. Complication of vascular access for dialysis, sequela Recommend:  The patient is doing well and currently has adequate dialysis access. The patient's dialysis center is not reporting any access issues. Flow pattern is stable when compared to the prior ultrasound.  The patient should have a duplex ultrasound of the dialysis access in 6 months.  The patient will follow-up with me in the office after each ultrasound   - VAS [place] (AVF, AVG); Future  2. ESRD on dialysis [facility]) At the present time the patient has adequate dialysis access.  Continue hemodialysis as ordered without interruption.  Avoid nephrotoxic medications and dehydration.  Further plans per nephrology  3. Type 2 diabetes mellitus with stage 5 chronic kidney disease not on chronic dialysis, without  long-term current use of insulin (HCC) Continue hypoglycemic medications as already ordered, these medications have been reviewed and there are no changes at this time.  Hgb A1C to be monitored as already arranged by primary service   4. Hyperlipidemia, unspecified hyperlipidemia type Continue statin as ordered and reviewed, no changes at this time     Hortencia Pilar, MD  10/06/2019 10:45 AM

## 2019-11-04 ENCOUNTER — Other Ambulatory Visit: Payer: Self-pay

## 2019-11-04 ENCOUNTER — Observation Stay
Admission: EM | Admit: 2019-11-04 | Discharge: 2019-11-05 | Disposition: A | Discharge status: Home / Self Care | Payer: Medicare HMO | Attending: Internal Medicine | Admitting: Internal Medicine

## 2019-11-04 ENCOUNTER — Encounter: Payer: Self-pay | Admitting: Emergency Medicine

## 2019-11-04 ENCOUNTER — Emergency Department: Payer: Medicare HMO

## 2019-11-04 DIAGNOSIS — I10 Essential (primary) hypertension: Secondary | ICD-10-CM | POA: Diagnosis not present

## 2019-11-04 DIAGNOSIS — I120 Hypertensive chronic kidney disease with stage 5 chronic kidney disease or end stage renal disease: Secondary | ICD-10-CM | POA: Diagnosis not present

## 2019-11-04 DIAGNOSIS — E039 Hypothyroidism, unspecified: Secondary | ICD-10-CM | POA: Insufficient documentation

## 2019-11-04 DIAGNOSIS — Z79899 Other long term (current) drug therapy: Secondary | ICD-10-CM | POA: Diagnosis not present

## 2019-11-04 DIAGNOSIS — N186 End stage renal disease: Secondary | ICD-10-CM | POA: Diagnosis not present

## 2019-11-04 DIAGNOSIS — E1122 Type 2 diabetes mellitus with diabetic chronic kidney disease: Secondary | ICD-10-CM | POA: Insufficient documentation

## 2019-11-04 DIAGNOSIS — R0789 Other chest pain: Principal | ICD-10-CM | POA: Insufficient documentation

## 2019-11-04 DIAGNOSIS — E785 Hyperlipidemia, unspecified: Secondary | ICD-10-CM

## 2019-11-04 DIAGNOSIS — R079 Chest pain, unspecified: Secondary | ICD-10-CM | POA: Diagnosis present

## 2019-11-04 DIAGNOSIS — Z79890 Hormone replacement therapy (postmenopausal): Secondary | ICD-10-CM | POA: Diagnosis not present

## 2019-11-04 DIAGNOSIS — D693 Immune thrombocytopenic purpura: Secondary | ICD-10-CM | POA: Diagnosis not present

## 2019-11-04 DIAGNOSIS — Z992 Dependence on renal dialysis: Secondary | ICD-10-CM | POA: Insufficient documentation

## 2019-11-04 DIAGNOSIS — Z20822 Contact with and (suspected) exposure to covid-19: Secondary | ICD-10-CM | POA: Insufficient documentation

## 2019-11-04 DIAGNOSIS — R55 Syncope and collapse: Secondary | ICD-10-CM | POA: Diagnosis not present

## 2019-11-04 DIAGNOSIS — M1990 Unspecified osteoarthritis, unspecified site: Secondary | ICD-10-CM | POA: Insufficient documentation

## 2019-11-04 LAB — CBC
HCT: 29.3 % — ABNORMAL LOW (ref 39.0–52.0)
Hemoglobin: 9.8 g/dL — ABNORMAL LOW (ref 13.0–17.0)
MCH: 33.7 pg (ref 26.0–34.0)
MCHC: 33.4 g/dL (ref 30.0–36.0)
MCV: 100.7 fL — ABNORMAL HIGH (ref 80.0–100.0)
Platelets: 136 10*3/uL — ABNORMAL LOW (ref 150–400)
RBC: 2.91 MIL/uL — ABNORMAL LOW (ref 4.22–5.81)
RDW: 13.4 % (ref 11.5–15.5)
WBC: 6.9 10*3/uL (ref 4.0–10.5)
nRBC: 0 % (ref 0.0–0.2)

## 2019-11-04 LAB — CREATININE, SERUM
Creatinine, Ser: 5.7 mg/dL — ABNORMAL HIGH (ref 0.61–1.24)
GFR calc Af Amer: 9 mL/min — ABNORMAL LOW (ref >60)
GFR calc non Af Amer: 8 mL/min — ABNORMAL LOW (ref >60)

## 2019-11-04 LAB — CBC WITH DIFFERENTIAL/PLATELET
Abs Immature Granulocytes: 0.03 10*3/uL (ref 0.00–0.07)
Basophils Absolute: 0 10*3/uL (ref 0.0–0.1)
Basophils Relative: 1 %
Eosinophils Absolute: 0.3 10*3/uL (ref 0.0–0.5)
Eosinophils Relative: 5 %
HCT: 31.3 % — ABNORMAL LOW (ref 39.0–52.0)
Hemoglobin: 10.4 g/dL — ABNORMAL LOW (ref 13.0–17.0)
Immature Granulocytes: 1 %
Lymphocytes Relative: 17 %
Lymphs Abs: 1.1 10*3/uL (ref 0.7–4.0)
MCH: 33.7 pg (ref 26.0–34.0)
MCHC: 33.2 g/dL (ref 30.0–36.0)
MCV: 101.3 fL — ABNORMAL HIGH (ref 80.0–100.0)
Monocytes Absolute: 0.5 10*3/uL (ref 0.1–1.0)
Monocytes Relative: 8 %
Neutro Abs: 4.6 10*3/uL (ref 1.7–7.7)
Neutrophils Relative %: 68 %
Platelets: 147 10*3/uL — ABNORMAL LOW (ref 150–400)
RBC: 3.09 MIL/uL — ABNORMAL LOW (ref 4.22–5.81)
RDW: 13.5 % (ref 11.5–15.5)
WBC: 6.5 10*3/uL (ref 4.0–10.5)
nRBC: 0 % (ref 0.0–0.2)

## 2019-11-04 LAB — COMPREHENSIVE METABOLIC PANEL
ALT: 14 U/L (ref 0–44)
AST: 19 U/L (ref 15–41)
Albumin: 3.6 g/dL (ref 3.5–5.0)
Alkaline Phosphatase: 104 U/L (ref 38–126)
Anion gap: 15 (ref 5–15)
BUN: 28 mg/dL — ABNORMAL HIGH (ref 8–23)
CO2: 25 mmol/L (ref 22–32)
Calcium: 8.7 mg/dL — ABNORMAL LOW (ref 8.9–10.3)
Chloride: 100 mmol/L (ref 98–111)
Creatinine, Ser: 5 mg/dL — ABNORMAL HIGH (ref 0.61–1.24)
GFR calc Af Amer: 11 mL/min — ABNORMAL LOW (ref >60)
GFR calc non Af Amer: 10 mL/min — ABNORMAL LOW (ref >60)
Glucose, Bld: 142 mg/dL — ABNORMAL HIGH (ref 70–99)
Potassium: 3.9 mmol/L (ref 3.5–5.1)
Sodium: 140 mmol/L (ref 135–145)
Total Bilirubin: 1.2 mg/dL (ref 0.3–1.2)
Total Protein: 7.8 g/dL (ref 6.5–8.1)

## 2019-11-04 LAB — TROPONIN I (HIGH SENSITIVITY)
Troponin I (High Sensitivity): 9 ng/L (ref <18)
Troponin I (High Sensitivity): 9 ng/L (ref <18)

## 2019-11-04 LAB — GLUCOSE, CAPILLARY: Glucose-Capillary: 147 mg/dL — ABNORMAL HIGH (ref 70–99)

## 2019-11-04 LAB — SARS CORONAVIRUS 2 BY RT PCR (HOSPITAL ORDER, PERFORMED IN ~~LOC~~ HOSPITAL LAB): SARS Coronavirus 2: NEGATIVE

## 2019-11-04 MED ORDER — LIDOCAINE VISCOUS HCL 2 % MT SOLN
15.0000 mL | Freq: Once | OROMUCOSAL | Status: AC
  Administered 2019-11-04: 15 mL via ORAL
  Filled 2019-11-04: qty 15

## 2019-11-04 MED ORDER — RENA-VITE PO TABS
1.0000 | ORAL_TABLET | Freq: Every day | ORAL | Status: DC
  Administered 2019-11-04: 1 via ORAL
  Filled 2019-11-04 (×2): qty 1

## 2019-11-04 MED ORDER — ONDANSETRON HCL 4 MG/2ML IJ SOLN: 4.0000 mg | Freq: Four times a day (QID) | INTRAMUSCULAR | Status: DC | PRN

## 2019-11-04 MED ORDER — FUROSEMIDE 40 MG PO TABS
40.0000 mg | ORAL_TABLET | Freq: Two times a day (BID) | ORAL | Status: DC
  Administered 2019-11-05: 40 mg via ORAL
  Filled 2019-11-04: qty 1

## 2019-11-04 MED ORDER — HYDRALAZINE HCL 50 MG PO TABS
25.0000 mg | ORAL_TABLET | Freq: Two times a day (BID) | ORAL | Status: DC
  Administered 2019-11-04 – 2019-11-05 (×2): 25 mg via ORAL
  Filled 2019-11-04 (×2): qty 1

## 2019-11-04 MED ORDER — SODIUM CHLORIDE 0.9 % IV SOLN: INTRAVENOUS | Status: DC

## 2019-11-04 MED ORDER — INSULIN ASPART 100 UNIT/ML ~~LOC~~ SOLN: 0.0000 [IU] | Freq: Three times a day (TID) | SUBCUTANEOUS | Status: DC

## 2019-11-04 MED ORDER — ALUM & MAG HYDROXIDE-SIMETH 200-200-20 MG/5ML PO SUSP
30.0000 mL | Freq: Once | ORAL | Status: AC
  Administered 2019-11-04: 30 mL via ORAL
  Filled 2019-11-04: qty 30

## 2019-11-04 MED ORDER — ASPIRIN EC 81 MG PO TBEC
81.0000 mg | DELAYED_RELEASE_TABLET | Freq: Every day | ORAL | Status: DC
  Administered 2019-11-04 – 2019-11-05 (×2): 81 mg via ORAL
  Filled 2019-11-04 (×2): qty 1

## 2019-11-04 MED ORDER — HEPARIN SODIUM (PORCINE) 5000 UNIT/ML IJ SOLN
5000.0000 [IU] | Freq: Three times a day (TID) | INTRAMUSCULAR | Status: DC
  Administered 2019-11-04 – 2019-11-05 (×2): 5000 [IU] via SUBCUTANEOUS
  Filled 2019-11-04 (×2): qty 1

## 2019-11-04 MED ORDER — LISINOPRIL 10 MG PO TABS
20.0000 mg | ORAL_TABLET | Freq: Every day | ORAL | Status: DC
  Administered 2019-11-04 – 2019-11-05 (×2): 20 mg via ORAL
  Filled 2019-11-04 (×2): qty 2

## 2019-11-04 MED ORDER — ATORVASTATIN CALCIUM 20 MG PO TABS
10.0000 mg | ORAL_TABLET | Freq: Every day | ORAL | Status: DC
  Administered 2019-11-04 – 2019-11-05 (×2): 10 mg via ORAL
  Filled 2019-11-04 (×2): qty 1

## 2019-11-04 MED ORDER — ALPRAZOLAM 0.5 MG PO TABS: 0.2500 mg | ORAL_TABLET | Freq: Two times a day (BID) | ORAL | Status: DC | PRN

## 2019-11-04 MED ORDER — AMLODIPINE BESYLATE 5 MG PO TABS
5.0000 mg | ORAL_TABLET | Freq: Every day | ORAL | Status: DC
  Administered 2019-11-04 – 2019-11-05 (×2): 5 mg via ORAL
  Filled 2019-11-04 (×2): qty 1

## 2019-11-04 MED ORDER — ACETAMINOPHEN 325 MG PO TABS
650.0000 mg | ORAL_TABLET | ORAL | Status: DC | PRN
  Administered 2019-11-05: 650 mg via ORAL
  Filled 2019-11-04: qty 2

## 2019-11-04 MED ORDER — LEVOTHYROXINE SODIUM 112 MCG PO TABS
112.0000 ug | ORAL_TABLET | Freq: Every day | ORAL | Status: DC
  Filled 2019-11-04: qty 1

## 2019-11-04 MED ORDER — ZOLPIDEM TARTRATE 5 MG PO TABS: 5.0000 mg | ORAL_TABLET | Freq: Every evening | ORAL | Status: DC | PRN

## 2019-11-04 NOTE — H&P (Signed)
[facility] [place] NAME: Alejandro Armstrong    MR#:  [id number]  DATE OF BIRTH:  08-08-1929  DATE OF ADMISSION:  11/04/2019  PRIMARY CARE PHYSICIAN: Casilda Carls, MD   REQUESTING/REFERRING PHYSICIAN: Merlyn Lot, MD CHIEF COMPLAINT:   Chief Complaint  Patient presents with  . Chest Pain    HISTORY OF PRESENT ILLNESS:  Alejandro Armstrong  is a 86 y.o. Caucasian male with a known history of end-stage renal disease on hemodialysis, type 2 diabetes mellitus, hypertension and dyslipidemia as well as hypothyroidism, presented to the emergency room with acute onset of right upper sternal chest pain felt as pressure and tightness and graded 6-7/10 in severity with associated reported brief unresponsiveness.the patient denied losing consciousness.  He denied any associated nausea or vomiting or diaphoresis or dyspnea or cough or wheezing.  He denied any leg pain or edema recent travels or surgeries.  No recent fever or chills.  He continues to make some urine and denies any dysuria, urinary frequency or urgency or flank pain or hematuria.  Upon presentation to the emergency room, heart rate was 58 with otherwise normal vital signs.  Labs were remarkable for BUN of 28 and creatinine of 5 and later on 5.7 and otherwise unremarkable CMP.  CBC showed anemia close to baseline.  High-sensitivity troponin I was 9.  COVID-19 PCR came back negative.  Portable chest x-ray showed no acute cardiopulmonary disease and EKG showed normal sinus rhythm with a rate of 62 with low voltage QRS and PVCs.  The patient was pain-free during my interview.  He will be admitted to an observation progressive cardiac bed for further evaluation and management. PAST MEDICAL HISTORY:   Past Medical History:  Diagnosis Date  . Arthritis   . Dialysis patient [facility])    Mon, Wed Fri  . Hyperlipidemia   . Hypertension   . Hypothyroidism   . Idiopathic thrombocytopenia purpura ([place])   . Kidney  stones   . Recurrent UTI   . Renal agenesis    discovered at age 41  . Type 2 diabetes mellitus ([place])   . Ureteral stricture     PAST SURGICAL HISTORY:   Past Surgical History:  Procedure Laterality Date  . A/V FISTULAGRAM Left 03/31/2017   Procedure: A/V Fistulagram;  Surgeon: [name], MD;  Location: [place] [facility];  Service: Cardiovascular;  Laterality: Left;  . A/V FISTULAGRAM Left 09/07/2018   Procedure: A/V FISTULAGRAM;  Surgeon: [name], MD;  Location: [place] [facility];  Service: Cardiovascular;  Laterality: Left;  . A/V SHUNTOGRAM Left 07/28/2017   Procedure: A/V SHUNTOGRAM;  Surgeon: [name], MD;  Location: [place] [facility];  Service: Cardiovascular;  Laterality: Left;  . A/V SHUNTOGRAM Left 03/24/2018   Procedure: A/V SHUNTOGRAM;  Surgeon: [name], MD;  Location: [place] [facility];  Service: Cardiovascular;  Laterality: Left;  . APPENDECTOMY    . AV FISTULA PLACEMENT Left 04/16/2016   Procedure: INSERTION OF ARTERIOVENOUS (AV) GORE-TEX GRAFT ARM;  Surgeon: [name], MD;  Location: [facility];  Service: Vascular;  Laterality: Left;  . CATARACT EXTRACTION W/ INTRAOCULAR LENS IMPLANT Bilateral 2014   done 1-2 months apart at [facility].  [name] FINGER SURGERY Left 2002   pinky finger  . KIDNEY STONE SURGERY    . KYPHOPLASTY [name] 09/16/2018   Procedure: KYPHOPLASTY L3, DIABETIC;  Surgeon: Hessie Knows, MD;  Location: [facility];  Service: Orthopedics;  Laterality: [name];  .  NASAL SINUS SURGERY  1985  . PERIPHERAL VASCULAR CATHETERIZATION  05/13/2016   Procedure: Upper Extremity Angiography;  Surgeon: [name], MD;  Location: [place] [facility];  Service: Cardiovascular;;  . PERIPHERAL VASCULAR CATHETERIZATION  05/13/2016   Procedure: Upper Extremity Intervention;  Surgeon: [name], MD;  Location: [place] [facility];  Service: Cardiovascular;;  . PICC LINE PLACE PERIPHERAL ([place] HX) Right 08/2015  . PICC  LINE REMOVAL ([place] HX) Right 09/2015    SOCIAL HISTORY:   Social History   Tobacco Use  . Smoking status: Never Smoker  . Smokeless tobacco: Never Used  Substance Use Topics  . Alcohol use: No    Alcohol/week: 0.0 standard drinks    FAMILY HISTORY:   Family History  Problem Relation Age of Onset  . Cervical cancer Sister     DRUG ALLERGIES:  No Known Allergies  REVIEW OF SYSTEMS:   ROS As per history of present illness. All pertinent systems were reviewed above. Constitutional,  HEENT, cardiovascular, respiratory, GI, GU, musculoskeletal, neuro, psychiatric, endocrine,  integumentary and hematologic systems were reviewed and are otherwise  negative/unremarkable except for positive findings mentioned above in the HPI.   MEDICATIONS AT HOME:   Prior to Admission medications   Medication Sig Start Date End Date Taking? Authorizing Provider  acetaminophen (TYLENOL) 500 MG tablet Take 1,000 mg by mouth every 6 (six) hours as needed for moderate pain or headache.    Yes [provider]  amLODipine (NORVASC) 5 MG tablet Take 5 mg by mouth daily.   Yes [provider]  atorvastatin (LIPITOR) 10 MG tablet Take 10 mg by mouth daily.   Yes [provider]  B Complex-C-Folic Acid (RENA-VITE PO) Take 1 tablet by mouth daily.   Yes [provider]  Carboxymethylcellulose Sodium (THERATEARS) 0.25 % SOLN Place 1-2 drops into both eyes 3 (three) times daily as needed (for dry eyes).   Yes [provider]  Cholecalciferol (VITAMIN D3) 1.25 MG (50000 UT) CAPS Take by mouth once a week.   Yes [provider]  furosemide (LASIX) 40 MG tablet Take 40 mg by mouth 2 (two) times daily. In the morning and in the afternoon   Yes [provider]  hydrALAZINE (APRESOLINE) 25 MG tablet Take 25 mg by mouth 2 (two) times daily.   Yes [provider]  levothyroxine (SYNTHROID) 112 MCG tablet Take 112 mcg by mouth daily before  breakfast.   Yes [provider]  lidocaine-prilocaine (EMLA) cream Apply 1 application topically daily as needed (port access).  10/03/16  Yes [provider]  lisinopril (PRINIVIL,ZESTRIL) 20 MG tablet Take 20 mg by mouth daily.  06/24/12  Yes [provider]  multivitamin (RENA-VIT) TABS tablet Take 1 tablet by mouth daily.   Yes [provider]  Omega-3 1000 MG CAPS Take 2,000 mg by mouth 2 (two) times daily.    Yes [provider]      VITAL SIGNS:  Blood pressure 109/75, pulse (!) 54, temperature 97.6 F (36.4 C), temperature source Oral, resp. rate 15, height 5\' 7"  (1.702 m), weight 74.8 kg, SpO2 93 %.  PHYSICAL EXAMINATION:  Physical Exam  GENERAL:  86 y.o.-year-old Caucasian male patient lying in the bed with no acute distress.  EYES: Pupils equal, round, reactive to light and accommodation. No scleral icterus. Extraocular muscles intact.  HEENT: Head atraumatic, normocephalic. Oropharynx and nasopharynx clear.  NECK:  Supple, no jugular venous distention. No thyroid enlargement, no tenderness.  LUNGS: Normal breath sounds bilaterally, no wheezing, rales,rhonchi or crepitation. No use of accessory muscles of respiration.  CARDIOVASCULAR: Regular rate and rhythm, S1, S2 normal. No murmurs, rubs, or gallops.  ABDOMEN: Soft, nondistended, nontender. Bowel sounds present. No organomegaly or mass.  EXTREMITIES: No pedal edema, cyanosis, or clubbing.  NEUROLOGIC: Cranial nerves II through XII are intact. Muscle strength 5/5 in all extremities. Sensation intact. Gait not checked.  PSYCHIATRIC: The patient is alert and oriented x 3.  Normal affect and good eye contact. SKIN: No obvious rash, lesion, or ulcer.   LABORATORY PANEL:   CBC Recent Labs  Lab 11/04/19 1827  WBC 6.5  HGB 10.4*  HCT 31.3*  PLT 147*   ------------------------------------------------------------------------------------------------------------------  Chemistries    Recent Labs  Lab 11/04/19 1827  NA 140  K 3.9  CL 100  CO2 25  GLUCOSE 142*  BUN 28*  CREATININE 5.00*  CALCIUM 8.7*  AST 19  ALT 14  ALKPHOS 104  BILITOT 1.2   ------------------------------------------------------------------------------------------------------------------  Cardiac Enzymes No results for input(s): TROPONINI in the last 168 hours. ------------------------------------------------------------------------------------------------------------------  RADIOLOGY:  DG Chest Portable 1 View  Result Date: 11/04/2019 CLINICAL DATA:  Chest pain and unresponsiveness at dialysis EXAM: PORTABLE CHEST 1 VIEW COMPARISON:  09/18/2015 FINDINGS: Single frontal view of the chest demonstrates an unremarkable cardiac silhouette. Vascular stents are seen in the left subclavian and left axillary regions. No airspace disease, effusion, or pneumothorax. No acute bony abnormality. IMPRESSION: 1. No acute intrathoracic process. Electronically Signed   By: Randa Ngo M.D.   On: 11/04/2019 18:45      IMPRESSION AND PLAN:   1.  Chest pain, rule out acute coronary syndrome. -The patient will be admitted to an observation progressive cardiac bed. -We will follow serial troponin I's. -He will be placed on aspirin as well as as needed sublingual glycerin and morphine sulfate for pain. -Cardiology consultation will be obtained for further cardiac risk stratification. -I notified Dr. Saralyn Pilar about the patient.  2.  Possible syncope. -The patient will be followed with orthostatics every 12 hours. -We will monitor him for arrhythmias. -This could be neurally mediated due to pain.  3.  Hypertension. -We will continue his hydralazine and amlodipine.  4.  Dyslipidemia. -We will continue statin therapy and omega-3.  5.  End-stage renal disease on hemodialysis. -The patient just had hemodialysis today and is not due until Monday. -Nephrology consultation can be obtained as  needed.  6.  Hypothyroidism. -We will continue Synthroid and check TSH.  7.  Type 2 diabetes mellitus. -The patient will be placed on supplement coverage with NovoLog.  8.  DVT prophylaxis. -Subcutaneous heparin.   All the records are reviewed and case discussed with ED provider. The plan of care was discussed in details with the patient (and family). I answered all questions. The patient agreed to proceed with the above mentioned plan. Further management will depend upon hospital course.   CODE STATUS: Full code  Status is: Observation  The patient remains OBS appropriate and will d/c before 2 midnights.  Dispo: The patient is from: Home              Anticipated d/c is to: Home              Anticipated d/c date is: 1 day              Patient currently is not medically stable to d/c.  TOTAL TIME TAKING CARE OF THIS PATIENT:55 minutes.  Christel Mormon M.D on 11/04/2019 at 8:23 PM  [facility]   From 7 PM-7 AM, contact night-coverage [email or web address]  CC: Primary care physician; Casilda Carls, MD   Note: This dictation was prepared with Dragon dictation along with smaller phrase technology. Any transcriptional errors that result from this process are unintentional.

## 2019-11-04 NOTE — ED Provider Notes (Signed)
[facility] Emergency Department Provider Note    First MD Initiated Contact with Patient 11/04/19 1817     (approximate)  I have reviewed the triage vital signs and the nursing notes.   HISTORY  Chief Complaint Chest Pain    HPI Alejandro Armstrong is a 86 y.o. male   with the below listed past medical history presents to the ER from dialysis clinic for moderate to severe chest pain radiating to his shoulder.  Reportedly was unresponsive during this chest pain episode but did have spontaneous respirations.  Patient denies losing consciousness however.  Does not sound by report there was never CPR performed.  He was given nitro with some improvement in his pain.  On arrival to the ER he denies any pain or pressure.  No abdominal pain.  No recent fevers.  No shortness of breath.   Past Medical History:  Diagnosis Date  . Arthritis   . Dialysis patient [facility])    Mon, Wed Fri  . Hyperlipidemia   . Hypertension   . Hypothyroidism   . Idiopathic thrombocytopenia purpura ([name])   . Kidney stones   . Recurrent UTI   . Renal agenesis    discovered at age 67  . Type 2 diabetes mellitus ([name])   . Ureteral stricture    Family History  Problem Relation Age of Onset  . Cervical cancer Sister    Past Surgical History:  Procedure Laterality Date  . A/V FISTULAGRAM Left 03/31/2017   Procedure: A/V Fistulagram;  Surgeon: [name], MD;  Location: [place] [facility];  Service: Cardiovascular;  Laterality: Left;  . A/V FISTULAGRAM Left 09/07/2018   Procedure: A/V FISTULAGRAM;  Surgeon: [name], MD;  Location: [place] [facility];  Service: Cardiovascular;  Laterality: Left;  . A/V SHUNTOGRAM Left 07/28/2017   Procedure: A/V SHUNTOGRAM;  Surgeon: [name], MD;  Location: [place] [facility];  Service: Cardiovascular;  Laterality: Left;  . A/V SHUNTOGRAM Left 03/24/2018   Procedure: A/V SHUNTOGRAM;  Surgeon: [name], MD;   Location: [place] [facility];  Service: Cardiovascular;  Laterality: Left;  . APPENDECTOMY    . AV FISTULA PLACEMENT Left 04/16/2016   Procedure: INSERTION OF ARTERIOVENOUS (AV) GORE-TEX GRAFT ARM;  Surgeon: [name], MD;  Location: [facility];  Service: Vascular;  Laterality: Left;  . CATARACT EXTRACTION W/ INTRAOCULAR LENS IMPLANT Bilateral 2014   done 1-2 months apart at [facility]  [name] FINGER SURGERY Left 2002   pinky finger  . KIDNEY STONE SURGERY    . KYPHOPLASTY [name] 09/16/2018   Procedure: KYPHOPLASTY L3, DIABETIC;  Surgeon: Hessie Knows, MD;  Location: [facility];  Service: Orthopedics;  Laterality: [name];  . NASAL SINUS SURGERY  1985  . PERIPHERAL VASCULAR CATHETERIZATION  05/13/2016   Procedure: Upper Extremity Angiography;  Surgeon: [name], MD;  Location: [place] [facility];  Service: Cardiovascular;;  . PERIPHERAL VASCULAR CATHETERIZATION  05/13/2016   Procedure: Upper Extremity Intervention;  Surgeon: [name], MD;  Location: [place] [facility];  Service: Cardiovascular;;  . PICC LINE PLACE PERIPHERAL ([place] HX) Right 08/2015  . PICC LINE REMOVAL ([place] [facility]) Right 09/2015   Patient Active Problem List   Diagnosis Date Noted  . Complication of vascular access for dialysis 11/20/2017  . Hyperlipidemia 03/26/2017  . ESRD on dialysis ([place]) 03/26/2017  . Postop check 04/30/2016  . Fever chills 09/18/2015  . Bacteremia 09/18/2015  . Acute on chronic renal  insufficiency 09/01/2015  . Hyperkalemia 09/01/2015  . MRSA bacteremia secondary to PICC line infection 09/01/2015  . Type 2 diabetes mellitus ([name]) 09/01/2015  . UTI (lower urinary tract infection) 08/06/2015  . Idiopathic thrombocytopenic purpura ([name]) 08/02/2015  . B12 deficiency 07/24/2015      Prior to Admission medications   Medication Sig Start Date End Date Taking? Authorizing Provider  acetaminophen (TYLENOL) 500 MG tablet Take 1,000 mg by mouth every 6 (six) hours as needed for moderate pain  or headache.     [provider]  amLODipine (NORVASC) 5 MG tablet Take 5 mg by mouth daily.    [provider]  atorvastatin (LIPITOR) 10 MG tablet Take 10 mg by mouth daily.    [provider]  B Complex-C-Folic Acid (RENA-VITE PO) Take 1 tablet by mouth daily.    [provider]  Carboxymethylcellulose Sodium (THERATEARS) 0.25 % SOLN Place 1-2 drops into both eyes 3 (three) times daily as needed (for dry eyes).    [provider]  Cholecalciferol (VITAMIN D3) 1.25 MG (50000 UT) CAPS Take by mouth once a week.    [provider]  cyanocobalamin (,VITAMIN B-12,) 1000 MCG/ML injection Inject 1,000 mcg into the muscle once. Given at PCP    [provider]  furosemide (LASIX) 40 MG tablet Take 40 mg by mouth 2 (two) times daily. In the morning and in the afternoon    [provider]  hydrALAZINE (APRESOLINE) 25 MG tablet Take 25 mg by mouth 2 (two) times daily.    [provider]  levothyroxine (SYNTHROID) 112 MCG tablet Take 112 mcg by mouth daily before breakfast.    [provider]  lidocaine-prilocaine (EMLA) cream Apply 1 application topically daily as needed (port access).  10/03/16   [provider]  lisinopril (PRINIVIL,ZESTRIL) 20 MG tablet Take 20 mg by mouth daily.  06/24/12   [provider]  multivitamin (RENA-VIT) TABS tablet Take 1 tablet by mouth daily.    [provider]  Omega-3 1000 MG CAPS Take 2,000 mg by mouth 2 (two) times daily.     [provider]    Allergies Patient has no known allergies.    Social History Social History   Tobacco Use  . Smoking status: Never Smoker  . Smokeless tobacco: Never Used  Substance Use Topics  . Alcohol use: No    Alcohol/week: 0.0 standard drinks  . Drug use: No    Review of Systems Patient denies headaches, rhinorrhea, blurry vision, numbness, shortness of breath, chest pain, edema, cough, abdominal pain,  nausea, vomiting, diarrhea, dysuria, fevers, rashes or hallucinations unless otherwise stated above in HPI. ____________________________________________   PHYSICAL EXAM:  VITAL SIGNS: Vitals:   11/04/19 1821 11/04/19 1830  BP: 113/68 112/69  Pulse:  (!) 55  Resp:  14  Temp:    SpO2:  (!) 88%    Constitutional: Alert and oriented.  Eyes: Conjunctivae are normal.  Head: Atraumatic. Nose: No congestion/rhinnorhea. Mouth/Throat: Mucous membranes are moist.   Neck: No stridor. Painless ROM.  Cardiovascular: Normal rate, regular rhythm. Grossly normal heart sounds.  Good peripheral circulation.  Left av fistula with palpable frill Respiratory: Normal respiratory effort.  No retractions. Lungs CTAB. Gastrointestinal: Soft and nontender. No distention. No abdominal bruits. No CVA tenderness. Genitourinary:  Musculoskeletal: No lower extremity tenderness nor edema.  No joint effusions. Neurologic:  Normal speech and language. No gross focal neurologic deficits are appreciated. No facial droop Skin:  Skin is warm, dry  and intact. No rash noted. Psychiatric: Mood and affect are normal. Speech and behavior are normal.  ____________________________________________   LABS (all labs ordered are listed, but only abnormal results are displayed)  Results for orders placed or performed during the hospital encounter of 11/04/19 (from the past 24 hour(s))  CBC with Differential/Platelet     Status: Abnormal   Collection Time: 11/04/19  6:27 PM  Result Value Ref Range   WBC 6.5 4.0 - 10.5 K/uL   RBC 3.09 (L) 4.22 - 5.81 MIL/uL   Hemoglobin 10.4 (L) 13.0 - 17.0 g/dL   HCT 31.3 (L) 39.0 - 52.0 %   MCV 101.3 (H) 80.0 - 100.0 fL   MCH 33.7 26.0 - 34.0 pg   MCHC 33.2 30.0 - 36.0 g/dL   RDW 13.5 11.5 - 15.5 %   Platelets 147 (L) 150 - 400 K/uL   nRBC 0.0 0.0 - 0.2 %   Neutrophils Relative % 68 %   Neutro Abs 4.6 1.7 - 7.7 K/uL   Lymphocytes Relative 17 %   Lymphs Abs 1.1 0.7 - 4.0 K/uL    Monocytes Relative 8 %   Monocytes Absolute 0.5 0.1 - 1.0 K/uL   Eosinophils Relative 5 %   Eosinophils Absolute 0.3 0.0 - 0.5 K/uL   Basophils Relative 1 %   Basophils Absolute 0.0 0.0 - 0.1 K/uL   Immature Granulocytes 1 %   Abs Immature Granulocytes 0.03 0.00 - 0.07 K/uL  Comprehensive metabolic panel     Status: Abnormal   Collection Time: 11/04/19  6:27 PM  Result Value Ref Range   Sodium 140 135 - 145 mmol/L   Potassium 3.9 3.5 - 5.1 mmol/L   Chloride 100 98 - 111 mmol/L   CO2 25 22 - 32 mmol/L   Glucose, Bld 142 (H) 70 - 99 mg/dL   BUN 28 (H) 8 - 23 mg/dL   Creatinine, Ser 5.00 (H) 0.61 - 1.24 mg/dL   Calcium 8.7 (L) 8.9 - 10.3 mg/dL   Total Protein 7.8 6.5 - 8.1 g/dL   Albumin 3.6 3.5 - 5.0 g/dL   AST 19 15 - 41 U/L   ALT 14 0 - 44 U/L   Alkaline Phosphatase 104 38 - 126 U/L   Total Bilirubin 1.2 0.3 - 1.2 mg/dL   GFR calc non Af Amer 10 (L) >60 mL/min   GFR calc Af Amer 11 (L) >60 mL/min   Anion gap 15 5 - 15  Troponin I (High Sensitivity)     Status: None   Collection Time: 11/04/19  6:27 PM  Result Value Ref Range   Troponin I (High Sensitivity) 9 <18 ng/L   ____________________________________________  EKG My review and personal interpretation at Time: 18:19   Indication: chest pain  Rate: 60  Rhythm: sinus with pvcs Axis: normal Other: normal intervals, nonspecific st abn ____________________________________________  RADIOLOGY  I personally reviewed all radiographic images ordered to evaluate for the above acute complaints and reviewed radiology reports and findings.  These findings were personally discussed with the patient.  Please see medical record for radiology report.  ____________________________________________   PROCEDURES  Procedure(s) performed:  Procedures    Critical Care performed: no ____________________________________________   INITIAL IMPRESSION / ASSESSMENT AND PLAN / ED COURSE  Pertinent labs & imaging results that were  available during my care of the patient were reviewed by me and considered in my medical decision making (see chart for details).   DDX: ACS, hypertensive urgency, pericarditis pe,  dissection, pna, bronchitis, costochondritis   Alejandro Armstrong is a 86 y.o. who presents to the ED with chest pain and discomfort as described above with reported episodes of unresponsiveness.  He is having occasional PVCs but on cardiac monitor I do not see any evidence of V. Tach or other dysrhythmia.  His initial troponin is negative.  Has some nonspecific ST changes from previous but he is currently pain-free.  Does not seem consistent with PE or dissection.  He has no hypoxia is not complaining shortness of breath.  His abdominal exam is soft and benign.  Given his high risk profile and possible syncopal episodes will discuss with hospitalist for further medical work-up and observation.     The patient was evaluated in Emergency Department today for the symptoms described in the history of present illness. He/she was evaluated in the context of the global COVID-19 pandemic, which necessitated consideration that the patient might be at risk for infection with the SARS-CoV-2 virus that causes COVID-19. Institutional protocols and algorithms that pertain to the evaluation of patients at risk for COVID-19 are in a state of rapid change based on information released by regulatory bodies including the CDC and federal and state organizations. These policies and algorithms were followed during the patient's care in the ED.  As part of my medical decision making, I reviewed the following data within the [place] notes reviewed and incorporated, Labs reviewed, notes from prior ED visits and [place] Controlled Substance Database   ____________________________________________   FINAL CLINICAL IMPRESSION(S) / ED DIAGNOSES  Final diagnoses:  Chest pain, unspecified type      NEW MEDICATIONS  STARTED DURING THIS VISIT:  New Prescriptions   No medications on file     Note:  This document was prepared using Dragon voice recognition software and may include unintentional dictation errors.    Merlyn Lot, MD 11/04/19 [phone]

## 2019-11-04 NOTE — ED Triage Notes (Signed)
Pt arrival via [facility] from dialysis clinic. Pt had a procedure done to clean out his shunt/fistula and then had dialysis when he started having a cramping and episodes of unresponsiveness. Pt then began having chest pain that was a 7 out of 10. Dialysis gave him 2 of nitro and he is no longer having chest pain.   Pt denied cp with EMS, no further nitros given. Pt was in NSR with PVC's on EMS arrival. VS with EMS: 94/58, 96%, 66 HR

## 2019-11-04 NOTE — ED Notes (Signed)
Pt states having chest pain before but currently denies. Denies fevers, shob, n/v/d.

## 2019-11-05 DIAGNOSIS — R079 Chest pain, unspecified: Secondary | ICD-10-CM | POA: Diagnosis not present

## 2019-11-05 LAB — HEMOGLOBIN A1C
Hgb A1c MFr Bld: 6.7 % — ABNORMAL HIGH (ref 4.8–5.6)
Mean Plasma Glucose: 145.59 mg/dL

## 2019-11-05 LAB — TSH: TSH: 4.365 u[IU]/mL (ref 0.350–4.500)

## 2019-11-05 LAB — GLUCOSE, CAPILLARY: Glucose-Capillary: 106 mg/dL — ABNORMAL HIGH (ref 70–99)

## 2019-11-05 MED ORDER — INSULIN ASPART 100 UNIT/ML ~~LOC~~ SOLN
0.0000 [IU] | Freq: Three times a day (TID) | SUBCUTANEOUS | Status: DC
Start: 2019-11-05 — End: 2019-11-05

## 2019-11-05 MED ORDER — INSULIN ASPART 100 UNIT/ML ~~LOC~~ SOLN: 0.0000 [IU] | Freq: Three times a day (TID) | SUBCUTANEOUS | Status: DC

## 2019-11-05 MED ORDER — ASPIRIN 81 MG PO TBEC: 81.0000 mg | DELAYED_RELEASE_TABLET | Freq: Every day | ORAL | 0 refills | Status: DC

## 2019-11-05 NOTE — Progress Notes (Signed)
Pt received to room 254 from ED.  Pt oriented to room and call bell.  Pt denies pain or sob at this time.  See assessment, vs's and admit database for further details.  NSR.

## 2019-11-05 NOTE — Consult Note (Signed)
[facility] Cardiology  CARDIOLOGY CONSULT NOTE  Patient ID: Alejandro Armstrong MRN: [id number] DOB/AGE: May 15, 1930 86 y.o.  Admit date: 11/04/2019 Referring Physician [facility] Primary Physician [facility] Primary Cardiologist  Reason for Consultation chest pain  HPI: 86 year old gentleman referred for evaluation of chest pain.  Patient has end-stage renal disease on chronic hemodialysis.  Dialysis on 11/04/2019 the patient experienced a throbbing right-sided chest discomfort and was evaluated at [facility] ED.  CT revealed sinus rhythm with isolated premature ventricular contractions.  Since her troponin was negative (9, 9).  The chest pain episode lasted approximately 1 hour, without recurrence.  He denies chest pain today.  Review of systems complete and found to be negative unless listed above     Past Medical History:  Diagnosis Date  . Arthritis   . Dialysis patient [facility])    Mon, Wed Fri  . Hyperlipidemia   . Hypertension   . Hypothyroidism   . Idiopathic thrombocytopenia purpura ([place])   . Kidney stones   . Recurrent UTI   . Renal agenesis    discovered at age 47  . Type 2 diabetes mellitus ([place])   . Ureteral stricture     Past Surgical History:  Procedure Laterality Date  . A/V FISTULAGRAM Left 03/31/2017   Procedure: A/V Fistulagram;  Surgeon: [name], MD;  Location: [place] CV LAB;  Service: Cardiovascular;  Laterality: Left;  . A/V FISTULAGRAM Left 09/07/2018   Procedure: A/V FISTULAGRAM;  Surgeon: [name], MD;  Location: [place] CV LAB;  Service: Cardiovascular;  Laterality: Left;  . A/V SHUNTOGRAM Left 07/28/2017   Procedure: A/V SHUNTOGRAM;  Surgeon: [name], MD;  Location: [place] CV LAB;  Service: Cardiovascular;  Laterality: Left;  . A/V SHUNTOGRAM Left 03/24/2018   Procedure: A/V SHUNTOGRAM;  Surgeon: [name], MD;  Location: [place] CV LAB;  Service: Cardiovascular;  Laterality: Left;  . APPENDECTOMY    . AV  FISTULA PLACEMENT Left 04/16/2016   Procedure: INSERTION OF ARTERIOVENOUS (AV) GORE-TEX GRAFT ARM;  Surgeon: [name], MD;  Location: [facility] ORS;  Service: Vascular;  Laterality: Left;  . CATARACT EXTRACTION W/ INTRAOCULAR LENS IMPLANT Bilateral 2014   done 1-2 months apart at [facility].  [name] FINGER SURGERY Left 2002   pinky finger  . KIDNEY STONE SURGERY    . KYPHOPLASTY [name] 09/16/2018   Procedure: KYPHOPLASTY L3, DIABETIC;  Surgeon: Hessie Knows, MD;  Location: [facility] ORS;  Service: Orthopedics;  Laterality: [name];  . NASAL SINUS SURGERY  1985  . PERIPHERAL VASCULAR CATHETERIZATION  05/13/2016   Procedure: Upper Extremity Angiography;  Surgeon: [name], MD;  Location: [place] CV LAB;  Service: Cardiovascular;;  . PERIPHERAL VASCULAR CATHETERIZATION  05/13/2016   Procedure: Upper Extremity Intervention;  Surgeon: [name], MD;  Location: [place] CV LAB;  Service: Cardiovascular;;  . PICC LINE PLACE PERIPHERAL ([place] HX) Right 08/2015  . PICC LINE REMOVAL ([place] HX) Right 09/2015    Medications Prior to Admission  Medication Sig Dispense Refill Last Dose  . acetaminophen (TYLENOL) 500 MG tablet Take 1,000 mg by mouth every 6 (six) hours as needed for moderate pain or headache.    prn at prn  . amLODipine (NORVASC) 5 MG tablet Take 5 mg by mouth daily.   11/03/2019 at Unknown time  . atorvastatin (LIPITOR) 10 MG tablet Take 10 mg by mouth daily.   11/03/2019 at Unknown time  . B Complex-C-Folic Acid (RENA-VITE PO) Take 1 tablet by mouth  daily.   11/03/2019 at Unknown time  . Carboxymethylcellulose Sodium (THERATEARS) 0.25 % SOLN Place 1-2 drops into both eyes 3 (three) times daily as needed (for dry eyes).   prn at prn  . Cholecalciferol (VITAMIN D3) 1.25 MG (50000 UT) CAPS Take by mouth once a week.   Past Week at Unknown time  . furosemide (LASIX) 40 MG tablet Take 40 mg by mouth 2 (two) times daily. In the morning and in the afternoon   11/03/2019 at Unknown time  .  hydrALAZINE (APRESOLINE) 25 MG tablet Take 25 mg by mouth 2 (two) times daily.   11/03/2019 at Unknown time  . levothyroxine (SYNTHROID) 112 MCG tablet Take 112 mcg by mouth daily before breakfast.   11/03/2019 at Unknown time  . lidocaine-prilocaine (EMLA) cream Apply 1 application topically daily as needed (port access).   11 prn at prn  . lisinopril (PRINIVIL,ZESTRIL) 20 MG tablet Take 20 mg by mouth daily.    11/03/2019 at Unknown time  . multivitamin (RENA-VIT) TABS tablet Take 1 tablet by mouth daily.   11/03/2019 at Unknown time  . Omega-3 1000 MG CAPS Take 2,000 mg by mouth 2 (two) times daily.    11/03/2019 at Unknown time   Social History   Socioeconomic History  . Marital status: Married    Spouse name: Not on file  . Number of children: Not on file  . Years of education: Not on file  . Highest education level: Not on file  Occupational History  . Occupation: retired  Tobacco Use  . Smoking status: Never Smoker  . Smokeless tobacco: Never Used  Substance and Sexual Activity  . Alcohol use: No    Alcohol/week: 0.0 standard drinks  . Drug use: No  . Sexual activity: Never  Other Topics Concern  . Not on file  Social History Narrative  . Not on file   Social Determinants of Health   Financial Resource Strain:   . Difficulty of Paying Living Expenses:   Food Insecurity:   . Worried About Charity fundraiser in the Last Year:   . Arboriculturist in the Last Year:   Transportation Needs:   . Film/video editor (Medical):   [name] Lack of Transportation (Non-Medical):   Physical Activity:   . Days of Exercise per Week:   . Minutes of Exercise per Session:   Stress:   . Feeling of Stress :   Social Connections:   . Frequency of Communication with Friends and Family:   . Frequency of Social Gatherings with Friends and Family:   . Attends Religious Services:   . Active Member of Clubs or Organizations:   . Attends Archivist Meetings:   [name] Marital Status:    Intimate Partner Violence:   . Fear of Current or Ex-Partner:   . Emotionally Abused:   [name] Physically Abused:   . Sexually Abused:     Family History  Problem Relation Age of Onset  . Cervical cancer Sister       Review of systems complete and found to be negative unless listed above      PHYSICAL EXAM  General: Well developed, well nourished, in no acute distress HEENT:  Normocephalic and atramatic Neck:  No JVD.  Lungs: Clear bilaterally to auscultation and percussion. Heart: HRRR . Normal S1 and S2 without gallops or murmurs.  Abdomen: Bowel sounds are positive, abdomen soft and non-tender  Msk:  Back normal, normal gait. Normal strength and  tone for age. Extremities: No clubbing, cyanosis or edema.   Neuro: Alert and oriented X 3. Psych:  Good affect, responds appropriately  Labs:   Lab Results  Component Value Date   WBC 6.9 11/04/2019   HGB 9.8 (L) 11/04/2019   HCT 29.3 (L) 11/04/2019   MCV 100.7 (H) 11/04/2019   PLT 136 (L) 11/04/2019    Recent Labs  Lab 11/04/19 1827 11/04/19 1827 11/04/19 2028  NA 140  --   --   K 3.9  --   --   CL 100  --   --   CO2 25  --   --   BUN 28*  --   --   CREATININE 5.00*   < > 5.70*  CALCIUM 8.7*  --   --   PROT 7.8  --   --   BILITOT 1.2  --   --   ALKPHOS 104  --   --   ALT 14  --   --   AST 19  --   --   GLUCOSE 142*  --   --    < > = values in this interval not displayed.   Lab Results  Component Value Date   TROPONINI <0.03 08/06/2015   No results found for: CHOL No results found for: HDL No results found for: LDLCALC No results found for: TRIG No results found for: CHOLHDL No results found for: LDLDIRECT    Radiology: DG Chest Portable 1 View  Result Date: 11/04/2019 CLINICAL DATA:  Chest pain and unresponsiveness at dialysis EXAM: PORTABLE CHEST 1 VIEW COMPARISON:  09/18/2015 FINDINGS: Single frontal view of the chest demonstrates an unremarkable cardiac silhouette. Vascular stents are seen in the  left subclavian and left axillary regions. No airspace disease, effusion, or pneumothorax. No acute bony abnormality. IMPRESSION: 1. No acute intrathoracic process. Electronically Signed   By: Randa Ngo M.D.   On: 11/04/2019 18:45   VAS US DUPLEX DIALYSIS ACCESS (AVF, AVG)  Result Date: 10/10/2019 DIALYSIS ACCESS Reason for Exam: Routine follow up. Access Site: Left Upper Extremity. Access Type: Brachial-cephalic AVF. History: 09/07/2018 Central venous PTA stent, mid graft PTA          04/16/16: Left brachial-axillary AVG;          05/13/16: Left brachia & radial artery PTAs for steal;          03/31/17: Outflow vein PTA/stent;          03/24/18: PTA stricture of mid AVG          07/28/17: Peripheral segment PTA/stent with central venous PTA;. Comparison Study: 04/01/2019 Performing Technologist: Concha Norway RVT  Examination Guidelines: A complete evaluation includes B-mode imaging, spectral Doppler, color Doppler, and power Doppler as needed of all accessible portions of each vessel. Unilateral testing is considered an integral part of a complete examination. Limited examinations for reoccurring indications may be performed as noted.  Findings:   +--------------------+----------+-----------------+-------------------------+ AVG                 PSV (cm/s)Flow Vol (mL/min)        Describe          +--------------------+----------+-----------------+-------------------------+ Native artery inflow    74          1523                                 +--------------------+----------+-----------------+-------------------------+ Arterial anastomosis   473                                               +--------------------+----------+-----------------+-------------------------+  Prox graft             411                     stenotic and Diam = .13cm +--------------------+----------+-----------------+-------------------------+ Mid graft              181                                                +--------------------+----------+-----------------+-------------------------+ Distal graft           311                               stent           +--------------------+----------+-----------------+-------------------------+ Venous anastomosis     113                                               +--------------------+----------+-----------------+-------------------------+ Venous outflow          45                                               +--------------------+----------+-----------------+-------------------------+ +---------------+------------+----------+---------+--------+------------------+                  Diameter  Depth (cm)Branching  PSV      Flow Volume                        (cm)                        (cm/s)      (ml/min)      +---------------+------------+----------+---------+--------+------------------+ Left rad art                                     30                      dis                                                                      +---------------+------------+----------+---------+--------+------------------+ Antegrade                                                                +---------------+------------+----------+---------+--------+------------------+  Summary: Patent Left BrachAx AVG with stenosis and narrowing of the proximal AVG at .13cm as was seen previously. Adequate flow volume seen. Patent distal AVG/outflow vein stent with no stenosis seen.  *See table(s) above for measurements  and observations.  Diagnosing physician: Hortencia Pilar MD Electronically signed by Hortencia Pilar MD on 10/10/2019 at 5:29:37 PM.    --------------------------------------------------------------------------------   Final     EKG: Sinus rhythm with isolated PVCs  ASSESSMENT AND PLAN:   1.  Chest pain, with atypical features, nondiagnostic ECG, normal high-sensitivity troponin 2.  End-stage renal disease on chronic  hemodialysis 3.  Essential hypertension, blood pressure well controlled on current BP medications  Recommendations  1.  Agree with overall current therapy 2.  Defer full dose anticoagulation 3.  Consider functional study which could be done as outpatient 4.  Consider discharge home today follow-up as outpatient  Signed: Isaias Cowman MD,PhD, [facility] 11/05/2019, 8:34 AM

## 2019-11-05 NOTE — Plan of Care (Signed)

## 2019-11-08 NOTE — Discharge Summary (Signed)
[facility] Discharge Summary   Patient: Alejandro Armstrong TOI:[id number]  PCP: Casilda Carls, MD  Date of admission: 11/04/2019   Date of discharge: 11/05/2019      Discharge Diagnoses:  Principal diagnosis Chest pain.  Atypical.  Active Problems:   Chest pain   Admitted From: Home Disposition:  Home   Recommendations for Outpatient Follow-up:  1. PCP: Follow-up with PCP in 1 week. 2. Follow up LABS/TEST: None  Follow-up Information    Casilda Carls, MD. Schedule an appointment as soon as possible for a visit in 1 week(s).   Specialty: Internal Medicine Contact information: [address] [place] [address] [phone]        Isaias Cowman, MD Follow up.   Specialty: Cardiology Contact information: [facility] [place] [address] [phone]          Diet recommendation: Cardiac diet  Activity: The patient is advised to gradually reintroduce usual activities, as tolerated  Discharge Condition: stable  Code Status: Full code   History of present illness: As per the H and P dictated on admission, "Alejandro Armstrong  is a 86 y.o. Caucasian male with a known history of end-stage renal disease on hemodialysis, type 2 diabetes mellitus, hypertension and dyslipidemia as well as hypothyroidism, presented to the emergency room with acute onset of right upper sternal chest pain felt as pressure and tightness and graded 6-7/10 in severity with associated reported brief unresponsiveness.the patient denied losing consciousness.  He denied any associated nausea or vomiting or diaphoresis or dyspnea or cough or wheezing.  He denied any leg pain or edema recent travels or surgeries.  No recent fever or chills.  He continues to make some urine and denies any dysuria, urinary frequency or urgency or flank pain or hematuria.  Upon presentation to the emergency room, heart rate was 58 with otherwise normal vital  signs.  Labs were remarkable for BUN of 28 and creatinine of 5 and later on 5.7 and otherwise unremarkable CMP.  CBC showed anemia close to baseline.  High-sensitivity troponin I was 9.  COVID-19 PCR came back negative.  Portable chest x-ray showed no acute cardiopulmonary disease and EKG showed normal sinus rhythm with a rate of 62 with low voltage QRS and PVCs.  The patient was pain-free during my interview.  He will be admitted to an observation progressive cardiac bed for further evaluation and management."  Hospital Course:  Summary of his active problems in the hospital is as following. Chest pain. ACS ruled out. Troponins are negative. Currently no chest pain. Cardiology consulted recommend no further work-up for now. Outpatient follow-up recommended.  Concern for syncope pretension no acute symptoms are normal No event of arrhythmia on telemetry. No further recommended by cardiology. Patient assistance pretension  Essential hypertension  continue current blood pressure medication.  ESRD on HD. Continue home dialysis regimen.  Hypothyroidism Continue Synthroid.  Type 2 diabetes mellitus, controlled with diet. Continue home monitoring.  Hyperlipidemia Continue home regimen   Patient was ambulatory without any assistance. On the day of the discharge the patient's vitals were stable, and no other acute medical condition were reported by patient. the patient was felt safe to be discharge at Home with no therapy needed on discharge.  Consultants: Cardiology  Procedures: Echocardiogram   Discharge Exam: General: Appear in no distress, no Rash; Oral Mucosa Clear, moist. Cardiovascular: S1 and S2 Present, no Murmur, Respiratory: normal respiratory effort, Bilateral Air entry present and no Crackles, no  wheezes Abdomen: Bowel Sound present, Soft and no tenderness, no hernia Extremities: no Pedal edema, no calf tenderness Neurology: alert and oriented to time, place, and  person affect appropriate.  Filed Weights   11/04/19 1821 11/04/19 2133 11/05/19 0434  Weight: 74.8 kg 73.6 kg 73.7 kg   Vitals:   11/05/19 0434 11/05/19 0752  BP: (!) 98/59 113/71  Pulse: 61 60  Resp: 20 18  Temp: 98.3 F (36.8 C) 98.6 F (37 C)  SpO2: 94% 97%    DISCHARGE MEDICATION: Allergies as of 11/05/2019   No Known Allergies     Medication List    TAKE these medications   acetaminophen 500 MG tablet Commonly known as: TYLENOL Take 1,000 mg by mouth every 6 (six) hours as needed for moderate pain or headache.   amLODipine 5 MG tablet Commonly known as: NORVASC Take 5 mg by mouth daily.   aspirin 81 MG EC tablet Take 1 tablet (81 mg total) by mouth daily.   atorvastatin 10 MG tablet Commonly known as: LIPITOR Take 10 mg by mouth daily.   furosemide 40 MG tablet Commonly known as: LASIX Take 40 mg by mouth 2 (two) times daily. In the morning and in the afternoon   hydrALAZINE 25 MG tablet Commonly known as: APRESOLINE Take 25 mg by mouth 2 (two) times daily.   levothyroxine 112 MCG tablet Commonly known as: SYNTHROID Take 112 mcg by mouth daily before breakfast.   lidocaine-prilocaine cream Commonly known as: EMLA Apply 1 application topically daily as needed (port access).   lisinopril 20 MG tablet Commonly known as: ZESTRIL Take 20 mg by mouth daily.   Omega-3 1000 MG Caps Take 2,000 mg by mouth 2 (two) times daily.   RENA-VITE PO Take 1 tablet by mouth daily.   multivitamin Tabs tablet Take 1 tablet by mouth daily.   Theratears 0.25 % Soln Generic drug: Carboxymethylcellulose Sodium Place 1-2 drops into both eyes 3 (three) times daily as needed (for dry eyes).   Vitamin D3 1.25 MG (50000 UT) Caps Take by mouth once a week.      No Known Allergies Discharge Instructions    Diet - low sodium heart healthy   Complete by: As directed    Increase activity slowly   Complete by: As directed       The results of significant  diagnostics from this hospitalization (including imaging, microbiology, ancillary and laboratory) are listed below for reference.    Significant Diagnostic Studies: DG Chest Portable 1 View  Result Date: 11/04/2019 CLINICAL DATA:  Chest pain and unresponsiveness at dialysis EXAM: PORTABLE CHEST 1 VIEW COMPARISON:  09/18/2015 FINDINGS: Single frontal view of the chest demonstrates an unremarkable cardiac silhouette. Vascular stents are seen in the left subclavian and left axillary regions. No airspace disease, effusion, or pneumothorax. No acute bony abnormality. IMPRESSION: 1. No acute intrathoracic process. Electronically Signed   By: Randa Ngo M.D.   On: 11/04/2019 18:45    Microbiology: Recent Results (from the past 240 hour(s))  SARS Coronavirus 2 by RT PCR (hospital order, performed in [facility] hospital lab) Nasopharyngeal Nasopharyngeal Swab     Status: None   Collection Time: 11/04/19  7:31 PM   Specimen: Nasopharyngeal Swab  Result Value Ref Range Status   SARS Coronavirus 2 NEGATIVE NEGATIVE Final    Comment: (NOTE) SARS-CoV-2 target nucleic acids are NOT DETECTED. The SARS-CoV-2 RNA is generally detectable in upper and lower respiratory specimens during the acute phase of infection. The lowest  concentration of SARS-CoV-2 viral copies this assay can detect is 250 copies / mL. A negative result does not preclude SARS-CoV-2 infection and should not be used as the sole basis for treatment or other patient management decisions.  A negative result may occur with improper specimen collection / handling, submission of specimen other than nasopharyngeal swab, presence of viral mutation(s) within the areas targeted by this assay, and inadequate number of viral copies (<250 copies / mL). A negative result must be combined with clinical observations, patient history, and epidemiological information. Fact Sheet for Patients:   StrictlyIdeas.no Fact Sheet  for Healthcare Providers: BankingDealers.co.za This test is not yet approved or cleared  by the [place] FDA and has been authorized for detection and/or diagnosis of SARS-CoV-2 by FDA under an Emergency Use Authorization (EUA).  This EUA will remain in effect (meaning this test can be used) for the duration of the COVID-19 declaration under Section 564(b)(1) of the Act, 21 U.S.C. section 360bbb-3(b)(1), unless the authorization is terminated or revoked sooner. Performed at [facility], [address] [place], [place] [address]      Labs: CBC: Recent Labs  Lab 11/04/19 1827 11/04/19 2028  WBC 6.5 6.9  NEUTROABS 4.6  --   HGB 10.4* 9.8*  HCT 31.3* 29.3*  MCV 101.3* 100.7*  PLT 147* 408*   Basic Metabolic Panel: Recent Labs  Lab 11/04/19 1827 11/04/19 2028  NA 140  --   K 3.9  --   CL 100  --   CO2 25  --   GLUCOSE 142*  --   BUN 28*  --   CREATININE 5.00* 5.70*  CALCIUM 8.7*  --    Liver Function Tests: Recent Labs  Lab 11/04/19 1827  AST 19  ALT 14  ALKPHOS 104  BILITOT 1.2  PROT 7.8  ALBUMIN 3.6   No results for input(s): LIPASE, AMYLASE in the last 168 hours. No results for input(s): AMMONIA in the last 168 hours. Cardiac Enzymes: No results for input(s): CKTOTAL, CKMB, CKMBINDEX, TROPONINI in the last 168 hours. BNP (last 3 results) No results for input(s): BNP in the last 8760 hours. CBG: Recent Labs  Lab 11/04/19 2238 11/05/19 0754  GLUCAP 147* 106*    Time spent: 35 minutes  Signed:  Berle Mull  [facility] 11/05/2019 6:28 PM

## 2020-02-16 ENCOUNTER — Ambulatory Visit (INDEPENDENT_AMBULATORY_CARE_PROVIDER_SITE_OTHER): Payer: Medicare HMO

## 2020-02-16 ENCOUNTER — Encounter (INDEPENDENT_AMBULATORY_CARE_PROVIDER_SITE_OTHER): Payer: Self-pay | Admitting: Nurse Practitioner

## 2020-02-16 ENCOUNTER — Other Ambulatory Visit: Payer: Self-pay

## 2020-02-16 ENCOUNTER — Ambulatory Visit (INDEPENDENT_AMBULATORY_CARE_PROVIDER_SITE_OTHER): Payer: Medicare HMO | Admitting: Nurse Practitioner

## 2020-02-16 VITALS — BP 132/73 | HR 59 | Ht 67.0 in | Wt 165.0 lb

## 2020-02-16 DIAGNOSIS — N186 End stage renal disease: Secondary | ICD-10-CM

## 2020-02-16 DIAGNOSIS — I10 Essential (primary) hypertension: Secondary | ICD-10-CM | POA: Diagnosis not present

## 2020-02-16 DIAGNOSIS — T829XXS Unspecified complication of cardiac and vascular prosthetic device, implant and graft, sequela: Secondary | ICD-10-CM

## 2020-02-16 DIAGNOSIS — E785 Hyperlipidemia, unspecified: Secondary | ICD-10-CM

## 2020-02-16 DIAGNOSIS — N390 Urinary tract infection, site not specified: Secondary | ICD-10-CM | POA: Insufficient documentation

## 2020-02-16 DIAGNOSIS — Z992 Dependence on renal dialysis: Secondary | ICD-10-CM

## 2020-02-20 ENCOUNTER — Encounter (INDEPENDENT_AMBULATORY_CARE_PROVIDER_SITE_OTHER): Payer: Self-pay | Admitting: Nurse Practitioner

## 2020-02-20 NOTE — Progress Notes (Signed)
Subjective:    Patient ID: Alejandro Armstrong, male    DOB: June 30, 1929, 86 y.o.   MRN: [id number] Chief Complaint  Patient presents with  . Follow-up    U/S follow up    The patient returns to the office for followup of their dialysis access. The function of the access has been stable. The patient denies increased bleeding time or increased recirculation. Patient denies difficulty with cannulation. The patient denies hand pain or other symptoms consistent with steal phenomena.  No significant arm swelling.  The patient denies redness or swelling at the access site. The patient denies fever or chills at home or while on dialysis.  The patient denies amaurosis fugax or recent TIA symptoms. There are no recent neurological changes noted. The patient denies claudication symptoms or rest pain symptoms. The patient denies history of DVT, PE or superficial thrombophlebitis. The patient denies recent episodes of angina or shortness of breath.     Flow volume of 1099. Duplex ultrasound of the AV access shows a patent access with uniform velocities.  No focal hemodynamically significant stenosis.    Review of Systems  Musculoskeletal: Positive for gait problem.  All other systems reviewed and are negative.      Objective:   Physical Exam Vitals reviewed.  HENT:     Head: Normocephalic.  Cardiovascular:     Rate and Rhythm: Normal rate and regular rhythm.     Pulses: Normal pulses.     Heart sounds: Normal heart sounds.  Pulmonary:     Effort: Pulmonary effort is normal.     Breath sounds: Normal breath sounds.  Neurological:     Mental Status: He is alert and oriented to person, place, and time.     Motor: Weakness present.  Psychiatric:        Mood and Affect: Mood normal.        Behavior: Behavior normal.        Thought Content: Thought content normal.        Judgment: Judgment normal.     BP 132/73   Pulse (!) 59   Ht 5\' 7"  (1.702 m)   Wt 165 lb (74.8 kg)   BMI  25.84 kg/m   Past Medical History:  Diagnosis Date  . Arthritis   . Dialysis patient [facility])    Mon, Wed Fri  . Hyperlipidemia   . Hypertension   . Hypothyroidism   . Idiopathic thrombocytopenia purpura ([place])   . Kidney stones   . Recurrent UTI   . 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 Type 2 diabetes mellitus ([place])   . Ureteral stricture     Social History   Socioeconomic History  . Marital status: Married    Spouse name: Not on file  . Number of children: Not on file  . Years of education: Not on file  . Highest education level: Not on file  Occupational History  . Occupation: retired  Tobacco Use  . Smoking status: Never Smoker  . Smokeless tobacco: Never Used  Vaping Use  . Vaping Use: Never used  Substance and Sexual Activity  . Alcohol use: No    Alcohol/week: 0.0 standard drinks  . Drug use: No  . Sexual activity: Never  Other Topics Concern  . Not on file  Social History Narrative  . Not on file   Social Determinants of Health   Financial Resource Strain:   . Difficulty of Paying Living Expenses: Not on file  Food Insecurity:   . Worried About Charity fundraiser in the Last Year: Not on file  . Ran Out of Food in the Last Year: Not on file  Transportation Needs:   . Lack of Transportation (Medical): Not on file  . Lack of Transportation (Non-Medical): Not on file  Physical Activity:   . Days of Exercise per Week: Not on file  . Minutes of Exercise per Session: Not on file  Stress:   . Feeling of Stress : Not on file  Social Connections:   . Frequency of Communication with Friends and Family: Not on file  . Frequency of Social Gatherings with Friends and Family: Not on file  . Attends Religious Services: Not on file  . Active Member of Clubs or Organizations: Not on file  . Attends Archivist Meetings: Not on file  . Marital Status: Not on file  Intimate Partner Violence:   . Fear of Current or Ex-Partner: Not on file  .  Emotionally Abused: Not on file  . Physically Abused: Not on file  . Sexually Abused: Not on file    Past Surgical History:  Procedure Laterality Date  . A/V FISTULAGRAM Left 03/31/2017   Procedure: A/V Fistulagram;  Surgeon: [name], MD;  Location: [place] [facility];  Service: Cardiovascular;  Laterality: Left;  . A/V FISTULAGRAM Left 09/07/2018   Procedure: A/V FISTULAGRAM;  Surgeon: [name], MD;  Location: [place] [facility];  Service: Cardiovascular;  Laterality: Left;  . A/V SHUNTOGRAM Left 07/28/2017   Procedure: A/V SHUNTOGRAM;  Surgeon: [name], MD;  Location: [place] [facility];  Service: Cardiovascular;  Laterality: Left;  . A/V SHUNTOGRAM Left 03/24/2018   Procedure: A/V SHUNTOGRAM;  Surgeon: [name], MD;  Location: [place] [facility];  Service: Cardiovascular;  Laterality: Left;  . APPENDECTOMY    . AV FISTULA PLACEMENT Left 04/16/2016   Procedure: INSERTION OF ARTERIOVENOUS (AV) GORE-TEX GRAFT ARM;  Surgeon: [name], MD;  Location: [facility] OR[facility];  Service: Vascular;  Laterality: Left;  . CATARACT EXTRACTION W/ INTRAOCULAR LENS IMPLANT Bilateral 2014   done 1-2 months apart at [facility].  [name] FINGER SURGERY Left 2002   pinky finger  . KIDNEY STONE SURGERY    . KYPHOPLASTY [name] 09/16/2018   Procedure: KYPHOPLASTY L3, DIABETIC;  Surgeon: Hessie Knows, MD;  Location: [facility] ORS;  Service: Orthopedics;  Laterality: [name];  . NASAL SINUS SURGERY  1985  . PERIPHERAL VASCULAR CATHETERIZATION  05/13/2016   Procedure: Upper Extremity Angiography;  Surgeon: [name], MD;  Location: [place] [facility];  Service: Cardiovascular;;  . PERIPHERAL VASCULAR CATHETERIZATION  05/13/2016   Procedure: Upper Extremity Intervention;  Surgeon: [name], MD;  Location: [place] [facility];  Service: Cardiovascular;;  . PICC LINE PLACE PERIPHERAL ([place] HX) Right 08/2015  . PICC LINE REMOVAL ([place] HX) Right 09/2015    Family History    Problem Relation Age of Onset  . Cervical cancer Sister     No Known Allergies     Assessment & Plan:   1. ESRD on dialysis [facility]) Recommend:  The patient is doing well and currently has adequate dialysis access. The patient's dialysis center is not reporting any access issues. Flow pattern is stable when compared to the prior ultrasound.  The patient should have a duplex ultrasound of the dialysis access in 6 months. The patient will follow-up with me in the office after each ultrasound  2. Hyperlipidemia, unspecified hyperlipidemia type Continue statin as ordered and reviewed, no changes at this time   3. Essential (primary) hypertension Continue antihypertensive medications as already ordered, these medications have been reviewed and there are no changes at this time.    Current Outpatient Medications on File Prior to Visit  Medication Sig Dispense Refill  . Accu-Chek Softclix Lancets lancets     . acetaminophen (TYLENOL) 500 MG tablet Take 1,000 mg by mouth every 6 (six) hours as needed for moderate pain or headache.     [name] amLODipine (NORVASC) 5 MG tablet Take 5 mg by mouth daily.    [name] aspirin EC 81 MG EC tablet Take 1 tablet (81 mg total) by mouth daily. 30 tablet 0  . atorvastatin (LIPITOR) 10 MG tablet Take 10 mg by mouth daily.    . B Complex-C-Folic Acid (RENA-VITE PO) Take 1 tablet by mouth daily.    . Carboxymethylcellulose Sodium (THERATEARS) 0.25 % SOLN Place 1-2 drops into both eyes 3 (three) times daily as needed (for dry eyes).    . Cenegermin-bkbj (OXERVATE) 0.002 % SOLN     . cetirizine (ZYRTEC) 10 MG tablet Take by mouth.    . Cholecalciferol (VITAMIN D3) 1.25 MG (50000 UT) CAPS Take by mouth once a week.    . cyanocobalamin (,VITAMIN B-12,) 1000 MCG/ML injection INJECT 1ML INTRAMUSCULARLY ONCE EVERY WEEK X4 WEEKS    . furosemide (LASIX) 40 MG tablet Take 40 mg by mouth 2 (two) times daily. In the morning and in the afternoon    . glucose blood  (ACCU-CHEK AVIVA PLUS) test strip     . hydrALAZINE (APRESOLINE) 25 MG tablet Take 25 mg by mouth 2 (two) times daily.    . Insulin Pen Needle (DROPLET PEN NEEDLES) 32G X 4 MM MISC     . levothyroxine (SYNTHROID) 112 MCG tablet Take 112 mcg by mouth daily before breakfast.    . lidocaine-prilocaine (EMLA) cream Apply 1 application topically daily as needed (port access).   11  . lisinopril (PRINIVIL,ZESTRIL) 20 MG tablet Take 20 mg by mouth daily.     . multivitamin (RENA-VIT) TABS tablet Take 1 tablet by mouth daily.    [name] neomycin-polymyxin-dexamethasone (MAXITROL) 0.1 % ophthalmic suspension APPLY 1 DROP(S) IN RIGHT EYE 4 TIMES A DAY FOR 14 DAYS    . Omega-3 1000 MG CAPS Take 2,000 mg by mouth 2 (two) times daily.     . predniSONE (DELTASONE) 10 MG tablet TAKE 3 TABLETS FOR 2 DAYS, TAKE 2 TABLETS FOR 2 DAY, TAKE 1 TABLETS FOR 2 DAYS    . predniSONE (DELTASONE) 10 MG tablet     . traMADol (ULTRAM) 50 MG tablet TAKE 2 TABLETS BY MOUTH EVERY 6 (SIX) HOURS AS NEEDED FOR MODERATE PAIN OR SEVERE PAIN     Current Facility-Administered Medications on File Prior to Visit  Medication Dose Route Frequency Provider Last Rate Last Admin  . heparin lock flush 100 unit/mL  500 Units Intravenous Once [name], MD      . sodium chloride flush (NS) 0.9 % injection 10 mL  10 mL Intravenous PRN [name], MD      . sodium chloride flush (NS) 0.9 % injection 10 mL  10 mL Intravenous PRN [name], MD   10 mL at 08/16/15 0857    There are no Patient Instructions on file for this visit. No follow-ups on file.   Kris Hartmann, NP

## 2020-02-27 ENCOUNTER — Inpatient Hospital Stay: Payer: Medicare HMO | Attending: Oncology

## 2020-03-29 ENCOUNTER — Ambulatory Visit (INDEPENDENT_AMBULATORY_CARE_PROVIDER_SITE_OTHER): Payer: Medicare HMO | Admitting: Vascular Surgery

## 2020-03-29 ENCOUNTER — Encounter (INDEPENDENT_AMBULATORY_CARE_PROVIDER_SITE_OTHER): Payer: Medicare HMO

## 2020-04-04 ENCOUNTER — Other Ambulatory Visit (INDEPENDENT_AMBULATORY_CARE_PROVIDER_SITE_OTHER): Payer: Self-pay | Admitting: Nurse Practitioner

## 2020-04-04 DIAGNOSIS — T829XXS Unspecified complication of cardiac and vascular prosthetic device, implant and graft, sequela: Secondary | ICD-10-CM

## 2020-04-05 ENCOUNTER — Telehealth (INDEPENDENT_AMBULATORY_CARE_PROVIDER_SITE_OTHER): Payer: Self-pay

## 2020-04-05 ENCOUNTER — Other Ambulatory Visit: Payer: Self-pay

## 2020-04-05 ENCOUNTER — Encounter (INDEPENDENT_AMBULATORY_CARE_PROVIDER_SITE_OTHER): Payer: Self-pay | Admitting: Nurse Practitioner

## 2020-04-05 ENCOUNTER — Ambulatory Visit (INDEPENDENT_AMBULATORY_CARE_PROVIDER_SITE_OTHER): Payer: Medicare HMO | Admitting: Nurse Practitioner

## 2020-04-05 ENCOUNTER — Ambulatory Visit (INDEPENDENT_AMBULATORY_CARE_PROVIDER_SITE_OTHER): Payer: Medicare HMO

## 2020-04-05 VITALS — BP 149/75 | HR 62 | Resp 16 | Wt 161.0 lb

## 2020-04-05 DIAGNOSIS — N186 End stage renal disease: Secondary | ICD-10-CM | POA: Diagnosis not present

## 2020-04-05 DIAGNOSIS — T829XXS Unspecified complication of cardiac and vascular prosthetic device, implant and graft, sequela: Secondary | ICD-10-CM

## 2020-04-05 DIAGNOSIS — Z992 Dependence on renal dialysis: Secondary | ICD-10-CM | POA: Diagnosis not present

## 2020-04-05 DIAGNOSIS — E785 Hyperlipidemia, unspecified: Secondary | ICD-10-CM | POA: Diagnosis not present

## 2020-04-05 DIAGNOSIS — I10 Essential (primary) hypertension: Secondary | ICD-10-CM

## 2020-04-05 NOTE — Telephone Encounter (Signed)
Spoke with the patient and he is scheduled with Dr. Delana Meyer for a left arm shuntogram with a 9:00 am arrival time to the [facility]. Covid testing on 04/20/20 between 8-1 pm at the [facility]. Pre-procedure instructions were discussed and will be mailed.

## 2020-04-09 ENCOUNTER — Encounter (INDEPENDENT_AMBULATORY_CARE_PROVIDER_SITE_OTHER): Payer: Self-pay | Admitting: Nurse Practitioner

## 2020-04-10 NOTE — Progress Notes (Signed)
Subjective:    Patient ID: Alejandro Armstrong, male    DOB: 1930/06/16, 86 y.o.   MRN: [id number] Chief Complaint  Patient presents with  . Follow-up    ref [place] for HDA    The patient returns to the office for follow up regarding problem with the dialysis access. Currently the patient is maintained via a left brachial axillary graft.   The patient does not note significant increase in bleeding time after decannulation.  The patient has also been informed that there is increased recirculation.    The patient denies hand pain or other symptoms consistent with steal phenomena.  No significant arm swelling.  There is also concern for AV graft whistling.  The patient denies redness or swelling at the access site. The patient denies fever or chills at home or while on dialysis.  The patient denies amaurosis fugax or recent TIA symptoms. There are no recent neurological changes noted. The patient denies claudication symptoms or rest pain symptoms. The patient denies history of DVT, PE or superficial thrombophlebitis. The patient denies recent episodes of angina or shortness of breath.   The patient has a flow volume of 1046.  There is a narrowing in the venous anastomosis.   Review of Systems  Musculoskeletal: Positive for arthralgias and gait problem.  All other systems reviewed and are negative.      Objective:   Physical Exam Vitals reviewed.  HENT:     Head: Normocephalic.  Cardiovascular:     Rate and Rhythm: Normal rate.     Pulses:          Radial pulses are 2+ on the left side.     Heart sounds: Normal heart sounds.     Arteriovenous access: left arteriovenous access is present.    Comments: Good thrill with whistling bruit Pulmonary:     Effort: Pulmonary effort is normal.  Neurological:     Mental Status: He is alert and oriented to person, place, and time.     Motor: Weakness present.     Gait: Gait abnormal.  Psychiatric:        Mood and Affect: Mood normal.         Behavior: Behavior normal.        Thought Content: Thought content normal.        Judgment: Judgment normal.     BP (!) 149/75 (BP Location: Right Arm)   Pulse 62   Resp 16   Wt 161 lb (73 kg)   BMI 25.22 kg/m   Past Medical History:  Diagnosis Date  . Arthritis   . Dialysis patient [facility])    Mon, Wed Fri  . Hyperlipidemia   . Hypertension   . Hypothyroidism   . Idiopathic thrombocytopenia purpura ([place])   . Kidney stones   . Recurrent UTI   . Renal agenesis    discovered at age 28  . Type 2 diabetes mellitus ([place])   . Ureteral stricture     Social History   Socioeconomic History  . Marital status: Married    Spouse name: Not on file  . Number of children: Not on file  . Years of education: Not on file  . Highest education level: Not on file  Occupational History  . Occupation: retired  Tobacco Use  . Smoking status: Never Smoker  . Smokeless tobacco: Never Used  Vaping Use  . Vaping Use: Never used  Substance and Sexual Activity  . Alcohol use: No  Alcohol/week: 0.0 standard drinks  . Drug use: No  . Sexual activity: Never  Other Topics Concern  . Not on file  Social History Narrative  . Not on file   Social Determinants of Health   Financial Resource Strain:   . Difficulty of Paying Living Expenses: Not on file  Food Insecurity:   . Worried About Charity fundraiser in the Last Year: Not on file  . Ran Out of Food in the Last Year: Not on file  Transportation Needs:   . Lack of Transportation (Medical): Not on file  . Lack of Transportation (Non-Medical): Not on file  Physical Activity:   . Days of Exercise per Week: Not on file  . Minutes of Exercise per Session: Not on file  Stress:   . Feeling of Stress : Not on file  Social Connections:   . Frequency of Communication with Friends and Family: Not on file  . Frequency of Social Gatherings with Friends and Family: Not on file  . Attends Religious Services: Not on file  . Active  Member of Clubs or Organizations: Not on file  . Attends Archivist Meetings: Not on file  . Marital Status: Not on file  Intimate Partner Violence:   . Fear of Current or Ex-Partner: Not on file  . Emotionally Abused: Not on file  . Physically Abused: Not on file  . Sexually Abused: Not on file    Past Surgical History:  Procedure Laterality Date  . A/V FISTULAGRAM Left 03/31/2017   Procedure: A/V Fistulagram;  Surgeon: [name], MD;  Location: [place] [facility];  Service: Cardiovascular;  Laterality: Left;  . A/V FISTULAGRAM Left 09/07/2018   Procedure: A/V FISTULAGRAM;  Surgeon: [name], MD;  Location: [place] [facility];  Service: Cardiovascular;  Laterality: Left;  . A/V SHUNTOGRAM Left 07/28/2017   Procedure: A/V SHUNTOGRAM;  Surgeon: [name], MD;  Location: [place] [facility];  Service: Cardiovascular;  Laterality: Left;  . A/V SHUNTOGRAM Left 03/24/2018   Procedure: A/V SHUNTOGRAM;  Surgeon: [name], MD;  Location: [place] [facility];  Service: Cardiovascular;  Laterality: Left;  . APPENDECTOMY    . AV FISTULA PLACEMENT Left 04/16/2016   Procedure: INSERTION OF ARTERIOVENOUS (AV) GORE-TEX GRAFT ARM;  Surgeon: [name], MD;  Location: [facility] OR[facility];  Service: Vascular;  Laterality: Left;  . CATARACT EXTRACTION W/ INTRAOCULAR LENS IMPLANT Bilateral 2014   done 1-2 months apart at [facility].  [name] FINGER SURGERY Left 2002   pinky finger  . KIDNEY STONE SURGERY    . KYPHOPLASTY [name] 09/16/2018   Procedure: KYPHOPLASTY L3, DIABETIC;  Surgeon: Hessie Knows, MD;  Location: [facility];  Service: Orthopedics;  Laterality: [name];  . NASAL SINUS SURGERY  1985  . PERIPHERAL VASCULAR CATHETERIZATION  05/13/2016   Procedure: Upper Extremity Angiography;  Surgeon: [name], MD;  Location: [place] [facility];  Service: Cardiovascular;;  . PERIPHERAL VASCULAR CATHETERIZATION  05/13/2016   Procedure: Upper Extremity Intervention;   Surgeon: [name], MD;  Location: [place] [facility];  Service: Cardiovascular;;  . PICC LINE PLACE PERIPHERAL ([place] HX) Right 08/2015  . PICC LINE REMOVAL ([place] HX) Right 09/2015    Family History  Problem Relation Age of Onset  . Cervical cancer Sister     No Known Allergies     Assessment & Plan:   1. ESRD on dialysis [facility]) Recommend:  The patient is experiencing increasing problems with  their dialysis access.  Patient should have a fistulagram with the intention for intervention.  The intention for intervention is to restore appropriate flow and prevent thrombosis and possible loss of the access.  As well as improve the quality of dialysis therapy.  The risks, benefits and alternative therapies were reviewed in detail with the patient.  All questions were answered.  The patient agrees to proceed with angio/intervention.      2. Hyperlipidemia, unspecified hyperlipidemia type Continue statin as ordered and reviewed, no changes at this time   3. Essential (primary) hypertension Continue antihypertensive medications as already ordered, these medications have been reviewed and there are no changes at this time.    Current Outpatient Medications on File Prior to Visit  Medication Sig Dispense Refill  . Accu-Chek Softclix Lancets lancets     . acetaminophen (TYLENOL) 500 MG tablet Take 1,000 mg by mouth every 6 (six) hours as needed for moderate pain or headache.     [name] amLODipine (NORVASC) 5 MG tablet Take 5 mg by mouth daily.    [name] aspirin EC 81 MG EC tablet Take 1 tablet (81 mg total) by mouth daily. 30 tablet 0  . atorvastatin (LIPITOR) 10 MG tablet Take 10 mg by mouth daily.    . B Complex-C-Folic Acid (RENA-VITE PO) Take 1 tablet by mouth daily.    . Carboxymethylcellulose Sodium (THERATEARS) 0.25 % SOLN Place 1-2 drops into both eyes 3 (three) times daily as needed (for dry eyes).    . Cenegermin-bkbj (OXERVATE) 0.002 % SOLN     . cetirizine (ZYRTEC) 10 MG  tablet Take by mouth.    . Cholecalciferol (VITAMIN D3) 1.25 MG (50000 UT) CAPS Take by mouth once a week.    . cyanocobalamin (,VITAMIN B-12,) 1000 MCG/ML injection INJECT 1ML INTRAMUSCULARLY ONCE EVERY WEEK X4 WEEKS    . furosemide (LASIX) 40 MG tablet Take 40 mg by mouth 2 (two) times daily. In the morning and in the afternoon    . glucose blood (ACCU-CHEK AVIVA PLUS) test strip     . hydrALAZINE (APRESOLINE) 25 MG tablet Take 25 mg by mouth 2 (two) times daily.    . Insulin Pen Needle (DROPLET PEN NEEDLES) 32G X 4 MM MISC     . levothyroxine (SYNTHROID) 112 MCG tablet Take 112 mcg by mouth daily before breakfast.    . lidocaine-prilocaine (EMLA) cream Apply 1 application topically daily as needed (port access).   11  . lisinopril (PRINIVIL,ZESTRIL) 20 MG tablet Take 20 mg by mouth daily.     . multivitamin (RENA-VIT) TABS tablet Take 1 tablet by mouth daily.    [name] neomycin-polymyxin-dexamethasone (MAXITROL) 0.1 % ophthalmic suspension APPLY 1 DROP(S) IN RIGHT EYE 4 TIMES A DAY FOR 14 DAYS    . Omega-3 1000 MG CAPS Take 2,000 mg by mouth 2 (two) times daily.     . predniSONE (DELTASONE) 10 MG tablet TAKE 3 TABLETS FOR 2 DAYS, TAKE 2 TABLETS FOR 2 DAY, TAKE 1 TABLETS FOR 2 DAYS    . predniSONE (DELTASONE) 10 MG tablet     . traMADol (ULTRAM) 50 MG tablet TAKE 2 TABLETS BY MOUTH EVERY 6 (SIX) HOURS AS NEEDED FOR MODERATE PAIN OR SEVERE PAIN     Current Facility-Administered Medications on File Prior to Visit  Medication Dose Route Frequency Provider Last Rate Last Admin  . heparin lock flush 100 unit/mL  500 Units Intravenous Once [name], MD      . sodium chloride flush (  NS) 0.9 % injection 10 mL  10 mL Intravenous PRN [name], MD      . sodium chloride flush (NS) 0.9 % injection 10 mL  10 mL Intravenous PRN [name], MD   10 mL at 08/16/15 0857    There are no Patient Instructions on file for this visit. No follow-ups on file.   Kris Hartmann,  NP

## 2020-04-12 ENCOUNTER — Emergency Department: Payer: Medicare HMO

## 2020-04-12 ENCOUNTER — Inpatient Hospital Stay
Admission: EM | Admit: 2020-04-12 | Discharge: 2020-04-20 | DRG: 177 | Disposition: A | Discharge status: Home Health | Payer: Medicare HMO | Attending: Internal Medicine | Admitting: Internal Medicine

## 2020-04-12 ENCOUNTER — Other Ambulatory Visit: Payer: Self-pay

## 2020-04-12 ENCOUNTER — Encounter: Payer: Self-pay | Admitting: Intensive Care

## 2020-04-12 DIAGNOSIS — Z79899 Other long term (current) drug therapy: Secondary | ICD-10-CM | POA: Diagnosis not present

## 2020-04-12 DIAGNOSIS — E1165 Type 2 diabetes mellitus with hyperglycemia: Secondary | ICD-10-CM | POA: Diagnosis present

## 2020-04-12 DIAGNOSIS — Q602 Renal agenesis, unspecified: Secondary | ICD-10-CM

## 2020-04-12 DIAGNOSIS — J9601 Acute respiratory failure with hypoxia: Secondary | ICD-10-CM | POA: Diagnosis not present

## 2020-04-12 DIAGNOSIS — H9190 Unspecified hearing loss, unspecified ear: Secondary | ICD-10-CM | POA: Diagnosis present

## 2020-04-12 DIAGNOSIS — D693 Immune thrombocytopenic purpura: Secondary | ICD-10-CM | POA: Diagnosis present

## 2020-04-12 DIAGNOSIS — T380X5A Adverse effect of glucocorticoids and synthetic analogues, initial encounter: Secondary | ICD-10-CM | POA: Diagnosis present

## 2020-04-12 DIAGNOSIS — E119 Type 2 diabetes mellitus without complications: Secondary | ICD-10-CM | POA: Diagnosis not present

## 2020-04-12 DIAGNOSIS — J1282 Pneumonia due to coronavirus disease 2019: Secondary | ICD-10-CM | POA: Diagnosis present

## 2020-04-12 DIAGNOSIS — Z87442 Personal history of urinary calculi: Secondary | ICD-10-CM | POA: Diagnosis not present

## 2020-04-12 DIAGNOSIS — N2581 Secondary hyperparathyroidism of renal origin: Secondary | ICD-10-CM | POA: Diagnosis present

## 2020-04-12 DIAGNOSIS — Z992 Dependence on renal dialysis: Secondary | ICD-10-CM

## 2020-04-12 DIAGNOSIS — E039 Hypothyroidism, unspecified: Secondary | ICD-10-CM | POA: Diagnosis present

## 2020-04-12 DIAGNOSIS — Z8249 Family history of ischemic heart disease and other diseases of the circulatory system: Secondary | ICD-10-CM

## 2020-04-12 DIAGNOSIS — Z7982 Long term (current) use of aspirin: Secondary | ICD-10-CM

## 2020-04-12 DIAGNOSIS — E1122 Type 2 diabetes mellitus with diabetic chronic kidney disease: Secondary | ICD-10-CM | POA: Diagnosis present

## 2020-04-12 DIAGNOSIS — Z87440 Personal history of urinary (tract) infections: Secondary | ICD-10-CM | POA: Diagnosis not present

## 2020-04-12 DIAGNOSIS — U071 COVID-19: Principal | ICD-10-CM | POA: Diagnosis present

## 2020-04-12 DIAGNOSIS — Z7952 Long term (current) use of systemic steroids: Secondary | ICD-10-CM

## 2020-04-12 DIAGNOSIS — N186 End stage renal disease: Secondary | ICD-10-CM | POA: Diagnosis present

## 2020-04-12 DIAGNOSIS — E785 Hyperlipidemia, unspecified: Secondary | ICD-10-CM

## 2020-04-12 DIAGNOSIS — I10 Essential (primary) hypertension: Secondary | ICD-10-CM

## 2020-04-12 DIAGNOSIS — R531 Weakness: Secondary | ICD-10-CM | POA: Diagnosis present

## 2020-04-12 DIAGNOSIS — Z66 Do not resuscitate: Secondary | ICD-10-CM | POA: Diagnosis present

## 2020-04-12 DIAGNOSIS — R410 Disorientation, unspecified: Secondary | ICD-10-CM | POA: Diagnosis present

## 2020-04-12 DIAGNOSIS — Z9841 Cataract extraction status, right eye: Secondary | ICD-10-CM

## 2020-04-12 DIAGNOSIS — N185 Chronic kidney disease, stage 5: Secondary | ICD-10-CM | POA: Diagnosis not present

## 2020-04-12 DIAGNOSIS — Z8049 Family history of malignant neoplasm of other genital organs: Secondary | ICD-10-CM

## 2020-04-12 DIAGNOSIS — Z825 Family history of asthma and other chronic lower respiratory diseases: Secondary | ICD-10-CM

## 2020-04-12 DIAGNOSIS — R451 Restlessness and agitation: Secondary | ICD-10-CM | POA: Diagnosis not present

## 2020-04-12 DIAGNOSIS — Z818 Family history of other mental and behavioral disorders: Secondary | ICD-10-CM

## 2020-04-12 DIAGNOSIS — Z23 Encounter for immunization: Secondary | ICD-10-CM | POA: Diagnosis present

## 2020-04-12 DIAGNOSIS — Z9842 Cataract extraction status, left eye: Secondary | ICD-10-CM

## 2020-04-12 DIAGNOSIS — M1990 Unspecified osteoarthritis, unspecified site: Secondary | ICD-10-CM | POA: Diagnosis present

## 2020-04-12 DIAGNOSIS — D692 Other nonthrombocytopenic purpura: Secondary | ICD-10-CM | POA: Diagnosis present

## 2020-04-12 DIAGNOSIS — Z821 Family history of blindness and visual loss: Secondary | ICD-10-CM

## 2020-04-12 DIAGNOSIS — I480 Paroxysmal atrial fibrillation: Secondary | ICD-10-CM | POA: Diagnosis present

## 2020-04-12 DIAGNOSIS — I120 Hypertensive chronic kidney disease with stage 5 chronic kidney disease or end stage renal disease: Secondary | ICD-10-CM | POA: Diagnosis present

## 2020-04-12 DIAGNOSIS — E875 Hyperkalemia: Secondary | ICD-10-CM | POA: Diagnosis present

## 2020-04-12 DIAGNOSIS — Z961 Presence of intraocular lens: Secondary | ICD-10-CM | POA: Diagnosis present

## 2020-04-12 DIAGNOSIS — D631 Anemia in chronic kidney disease: Secondary | ICD-10-CM | POA: Diagnosis present

## 2020-04-12 LAB — RESPIRATORY PANEL BY RT PCR (FLU A&B, COVID)
Influenza A by PCR: NEGATIVE
Influenza B by PCR: NEGATIVE
SARS Coronavirus 2 by RT PCR: POSITIVE — AB

## 2020-04-12 LAB — C-REACTIVE PROTEIN: CRP: 0.8 mg/dL (ref <1.0)

## 2020-04-12 LAB — URINALYSIS, COMPLETE (UACMP) WITH MICROSCOPIC
Bilirubin Urine: NEGATIVE
Glucose, UA: >=500 mg/dL — AB
Ketones, ur: NEGATIVE mg/dL
Nitrite: NEGATIVE
Protein, ur: 100 mg/dL — AB
Specific Gravity, Urine: 1.009 (ref 1.005–1.030)
Squamous Epithelial / HPF: NONE SEEN (ref 0–5)
WBC, UA: >50 WBC/hpf — ABNORMAL HIGH (ref 0–5)
pH: 8 (ref 5.0–8.0)

## 2020-04-12 LAB — CBC
HCT: 33.9 % — ABNORMAL LOW (ref 39.0–52.0)
Hemoglobin: 11.2 g/dL — ABNORMAL LOW (ref 13.0–17.0)
MCH: 32.8 pg (ref 26.0–34.0)
MCHC: 33 g/dL (ref 30.0–36.0)
MCV: 99.4 fL (ref 80.0–100.0)
Platelets: 175 10*3/uL (ref 150–400)
RBC: 3.41 MIL/uL — ABNORMAL LOW (ref 4.22–5.81)
RDW: 14.7 % (ref 11.5–15.5)
WBC: 6.2 10*3/uL (ref 4.0–10.5)
nRBC: 0.3 % — ABNORMAL HIGH (ref 0.0–0.2)

## 2020-04-12 LAB — CBG MONITORING, ED: Glucose-Capillary: 133 mg/dL — ABNORMAL HIGH (ref 70–99)

## 2020-04-12 LAB — BASIC METABOLIC PANEL
Anion gap: 13 (ref 5–15)
BUN: 68 mg/dL — ABNORMAL HIGH (ref 8–23)
CO2: 17 mmol/L — ABNORMAL LOW (ref 22–32)
Calcium: 9.6 mg/dL (ref 8.9–10.3)
Chloride: 110 mmol/L (ref 98–111)
Creatinine, Ser: 12.18 mg/dL — ABNORMAL HIGH (ref 0.61–1.24)
GFR, Estimated: 4 mL/min — ABNORMAL LOW (ref >60)
Glucose, Bld: 198 mg/dL — ABNORMAL HIGH (ref 70–99)
Potassium: 6.8 mmol/L (ref 3.5–5.1)
Sodium: 140 mmol/L (ref 135–145)

## 2020-04-12 LAB — FIBRIN DERIVATIVES D-DIMER (ARMC ONLY): Fibrin derivatives D-dimer (ARMC): 1553.21 ng/mL (FEU) — ABNORMAL HIGH (ref 0.00–499.00)

## 2020-04-12 LAB — FERRITIN: Ferritin: 1113 ng/mL — ABNORMAL HIGH (ref 24–336)

## 2020-04-12 MED ORDER — FUROSEMIDE 40 MG PO TABS
40.0000 mg | ORAL_TABLET | Freq: Two times a day (BID) | ORAL | Status: DC
  Administered 2020-04-12 – 2020-04-16 (×8): 40 mg via ORAL
  Filled 2020-04-12 (×8): qty 1

## 2020-04-12 MED ORDER — ALTEPLASE 2 MG IJ SOLR
2.0000 mg | Freq: Once | INTRAMUSCULAR | Status: DC | PRN
  Filled 2020-04-12: qty 2

## 2020-04-12 MED ORDER — AMLODIPINE BESYLATE 5 MG PO TABS
5.0000 mg | ORAL_TABLET | Freq: Every day | ORAL | Status: DC
  Administered 2020-04-13 – 2020-04-16 (×4): 5 mg via ORAL
  Filled 2020-04-12 (×4): qty 1

## 2020-04-12 MED ORDER — SODIUM CHLORIDE 0.9 % IV SOLN
200.0000 mg | Freq: Once | INTRAVENOUS | Status: AC
  Administered 2020-04-12: 200 mg via INTRAVENOUS
  Filled 2020-04-12: qty 40

## 2020-04-12 MED ORDER — INSULIN ASPART 100 UNIT/ML ~~LOC~~ SOLN
SUBCUTANEOUS | Status: AC
  Administered 2020-04-12: 10 [IU] via INTRAVENOUS
  Filled 2020-04-12: qty 1

## 2020-04-12 MED ORDER — ALBUTEROL SULFATE HFA 108 (90 BASE) MCG/ACT IN AERS
2.0000 | INHALATION_SPRAY | Freq: Four times a day (QID) | RESPIRATORY_TRACT | Status: DC
  Administered 2020-04-12 – 2020-04-20 (×26): 2 via RESPIRATORY_TRACT
  Filled 2020-04-12 (×2): qty 6.7

## 2020-04-12 MED ORDER — CARBOXYMETHYLCELLULOSE SODIUM 0.25 % OP SOLN: 1.0000 [drp] | Freq: Three times a day (TID) | OPHTHALMIC | Status: DC | PRN

## 2020-04-12 MED ORDER — OMEGA-3 1000 MG PO CAPS: 2000.0000 mg | ORAL_CAPSULE | Freq: Two times a day (BID) | ORAL | Status: DC

## 2020-04-12 MED ORDER — CHLORHEXIDINE GLUCONATE CLOTH 2 % EX PADS
6.0000 | MEDICATED_PAD | Freq: Every day | CUTANEOUS | Status: DC
  Administered 2020-04-15 – 2020-04-20 (×6): 6 via TOPICAL
  Filled 2020-04-12 (×3): qty 6

## 2020-04-12 MED ORDER — HEPARIN SODIUM (PORCINE) 1000 UNIT/ML DIALYSIS
1000.0000 [IU] | INTRAMUSCULAR | Status: DC | PRN
  Filled 2020-04-12: qty 1

## 2020-04-12 MED ORDER — ACETAMINOPHEN 325 MG PO TABS
650.0000 mg | ORAL_TABLET | Freq: Four times a day (QID) | ORAL | Status: DC | PRN
  Administered 2020-04-12: 650 mg via ORAL
  Filled 2020-04-12: qty 2

## 2020-04-12 MED ORDER — SODIUM CHLORIDE 0.9 % IV SOLN: 100.0000 mL | INTRAVENOUS | Status: DC | PRN

## 2020-04-12 MED ORDER — CALCIUM GLUCONATE-NACL 2-0.675 GM/100ML-% IV SOLN
2.0000 g | Freq: Once | INTRAVENOUS | Status: AC
  Administered 2020-04-12: 2000 mg via INTRAVENOUS
  Filled 2020-04-12: qty 100

## 2020-04-12 MED ORDER — HEPARIN SODIUM (PORCINE) 5000 UNIT/ML IJ SOLN
5000.0000 [IU] | Freq: Three times a day (TID) | INTRAMUSCULAR | Status: DC
  Administered 2020-04-12 – 2020-04-15 (×7): 5000 [IU] via SUBCUTANEOUS
  Filled 2020-04-12 (×6): qty 1

## 2020-04-12 MED ORDER — ALBUTEROL SULFATE (2.5 MG/3ML) 0.083% IN NEBU
10.0000 mg | INHALATION_SOLUTION | Freq: Once | RESPIRATORY_TRACT | Status: AC
  Administered 2020-04-12: 10 mg via RESPIRATORY_TRACT
  Filled 2020-04-12: qty 12

## 2020-04-12 MED ORDER — ONDANSETRON HCL 4 MG PO TABS: 4.0000 mg | ORAL_TABLET | Freq: Four times a day (QID) | ORAL | Status: DC | PRN

## 2020-04-12 MED ORDER — LEVOTHYROXINE SODIUM 25 MCG PO TABS
125.0000 ug | ORAL_TABLET | Freq: Every day | ORAL | Status: DC
  Administered 2020-04-13 – 2020-04-18 (×5): 125 ug via ORAL
  Filled 2020-04-12: qty 3
  Filled 2020-04-12: qty 1
  Filled 2020-04-12: qty 3
  Filled 2020-04-12 (×2): qty 1

## 2020-04-12 MED ORDER — RENA-VITE PO TABS
1.0000 | ORAL_TABLET | Freq: Every day | ORAL | Status: DC
  Administered 2020-04-13 – 2020-04-20 (×8): 1 via ORAL
  Filled 2020-04-12 (×8): qty 1

## 2020-04-12 MED ORDER — ASPIRIN EC 81 MG PO TBEC
81.0000 mg | DELAYED_RELEASE_TABLET | Freq: Every day | ORAL | Status: DC
  Administered 2020-04-13 – 2020-04-20 (×7): 81 mg via ORAL
  Filled 2020-04-12 (×7): qty 1

## 2020-04-12 MED ORDER — HYDRALAZINE HCL 50 MG PO TABS
25.0000 mg | ORAL_TABLET | Freq: Two times a day (BID) | ORAL | Status: DC
  Administered 2020-04-12 – 2020-04-16 (×8): 25 mg via ORAL
  Filled 2020-04-12 (×7): qty 1

## 2020-04-12 MED ORDER — LORATADINE 10 MG PO TABS
10.0000 mg | ORAL_TABLET | Freq: Every day | ORAL | Status: DC
  Administered 2020-04-13 – 2020-04-20 (×8): 10 mg via ORAL
  Filled 2020-04-12 (×7): qty 1

## 2020-04-12 MED ORDER — ATORVASTATIN CALCIUM 20 MG PO TABS
10.0000 mg | ORAL_TABLET | Freq: Every day | ORAL | Status: DC
  Administered 2020-04-13 – 2020-04-20 (×8): 10 mg via ORAL
  Filled 2020-04-12 (×8): qty 1

## 2020-04-12 MED ORDER — SODIUM CHLORIDE 0.9 % IV SOLN
100.0000 mg | Freq: Every day | INTRAVENOUS | Status: DC
  Administered 2020-04-13: 100 mg via INTRAVENOUS
  Filled 2020-04-12 (×5): qty 20

## 2020-04-12 MED ORDER — SODIUM ZIRCONIUM CYCLOSILICATE 10 G PO PACK
10.0000 g | PACK | Freq: Once | ORAL | Status: AC
  Administered 2020-04-12: 10 g via ORAL
  Filled 2020-04-12: qty 1

## 2020-04-12 MED ORDER — ASCORBIC ACID 500 MG PO TABS
500.0000 mg | ORAL_TABLET | Freq: Every day | ORAL | Status: DC
  Administered 2020-04-12 – 2020-04-20 (×9): 500 mg via ORAL
  Filled 2020-04-12 (×9): qty 1

## 2020-04-12 MED ORDER — DEXTROSE 50 % IV SOLN
1.0000 | Freq: Once | INTRAVENOUS | Status: AC
  Administered 2020-04-12: 50 mL via INTRAVENOUS
  Filled 2020-04-12: qty 50

## 2020-04-12 MED ORDER — OMEGA-3-ACID ETHYL ESTERS 1 G PO CAPS
2.0000 g | ORAL_CAPSULE | Freq: Two times a day (BID) | ORAL | Status: DC
  Administered 2020-04-12 – 2020-04-20 (×15): 2 g via ORAL
  Filled 2020-04-12 (×16): qty 2

## 2020-04-12 MED ORDER — SODIUM CHLORIDE 0.9 % IV BOLUS
500.0000 mL | Freq: Once | INTRAVENOUS | Status: AC
  Administered 2020-04-12: 500 mL via INTRAVENOUS

## 2020-04-12 MED ORDER — LIDOCAINE HCL (PF) 1 % IJ SOLN
5.0000 mL | INTRAMUSCULAR | Status: DC | PRN
  Filled 2020-04-12: qty 5

## 2020-04-12 MED ORDER — GUAIFENESIN-DM 100-10 MG/5ML PO SYRP
10.0000 mL | ORAL_SOLUTION | ORAL | Status: DC | PRN
  Filled 2020-04-12: qty 10

## 2020-04-12 MED ORDER — ONDANSETRON HCL 4 MG/2ML IJ SOLN
4.0000 mg | Freq: Four times a day (QID) | INTRAMUSCULAR | Status: DC | PRN
  Administered 2020-04-12: 4 mg via INTRAVENOUS
  Filled 2020-04-12: qty 2

## 2020-04-12 MED ORDER — PENTAFLUOROPROP-TETRAFLUOROETH EX AERO
1.0000 "application " | INHALATION_SPRAY | CUTANEOUS | Status: DC | PRN
  Filled 2020-04-12: qty 30

## 2020-04-12 MED ORDER — POLYVINYL ALCOHOL 1.4 % OP SOLN
1.0000 [drp] | Freq: Three times a day (TID) | OPHTHALMIC | Status: DC | PRN
  Filled 2020-04-12: qty 15

## 2020-04-12 MED ORDER — LIDOCAINE-PRILOCAINE 2.5-2.5 % EX CREA
1.0000 "application " | TOPICAL_CREAM | Freq: Every day | CUTANEOUS | Status: DC | PRN
  Filled 2020-04-12: qty 5

## 2020-04-12 MED ORDER — INSULIN ASPART 100 UNIT/ML IV SOLN
10.0000 [IU] | Freq: Once | INTRAVENOUS | Status: AC
  Filled 2020-04-12: qty 0.1

## 2020-04-12 MED ORDER — DEXAMETHASONE SODIUM PHOSPHATE 10 MG/ML IJ SOLN
6.0000 mg | INTRAMUSCULAR | Status: DC
  Administered 2020-04-12: 6 mg via INTRAVENOUS
  Filled 2020-04-12: qty 1

## 2020-04-12 MED ORDER — LIDOCAINE-PRILOCAINE 2.5-2.5 % EX CREA
1.0000 "application " | TOPICAL_CREAM | CUTANEOUS | Status: DC | PRN
  Filled 2020-04-12: qty 5

## 2020-04-12 MED ORDER — ZINC SULFATE 220 (50 ZN) MG PO CAPS
220.0000 mg | ORAL_CAPSULE | Freq: Every day | ORAL | Status: DC
  Administered 2020-04-12 – 2020-04-20 (×8): 220 mg via ORAL
  Filled 2020-04-12 (×9): qty 1

## 2020-04-12 MED ORDER — SODIUM BICARBONATE 8.4 % IV SOLN
50.0000 meq | Freq: Once | INTRAVENOUS | Status: AC
  Administered 2020-04-12: 50 meq via INTRAVENOUS
  Filled 2020-04-12: qty 50

## 2020-04-12 NOTE — ED Provider Notes (Signed)
[facility] Emergency Department Provider Note ____________________________________________   First MD Initiated Contact with Patient 04/12/20 1521     (approximate)  I have reviewed the triage vital signs and the nursing notes.  HISTORY  Chief Complaint Weakness and Fatigue   HPI CAPRICE WASKO is a 86 y.o. malewho presents to the ED for evaluation of generalized weakness and fatigue.  Chart review indicates patient was hemodialysis dependent with M, W, F schedule.  Reportedly last dialyzed on 10/22 and then tested positive for COVID-19 yesterday. Currently patient uses a left brachial artery graft, but has had increasing problems with dialysis access site and is currently scheduled to have a fistulogram on 11/9    Patient reports sensation of generalized weakness, decreased p.o. intake and anosmia without respiratory symptoms.  He reports "just feeling bad."  Indicates his wife made him go to the ED today because he was complaining.  Patient reports he has not been to dialysis for 7 days due to feeling unwell.  Denies fevers, chest pain, shortness of breath or cough, and is primarily reporting generalized weakness.  Reports he still makes urine and denies dysuria.  Denies vaccination for COVID-19 and reports testing positive yesterday, though he does not have documentation to show me.    Past Medical History:  Diagnosis Date  . Arthritis   . Dialysis patient [facility])    Mon, Wed Fri  . Hyperlipidemia   . Hypertension   . Hypothyroidism   . Idiopathic thrombocytopenia purpura ([place])   . Kidney stones   . Recurrent UTI   . Renal agenesis    discovered at age 63  . Type 2 diabetes mellitus ([place])   . Ureteral stricture     Patient Active Problem List   Diagnosis Date Noted  . Recurrent UTI 02/16/2020  . Chest pain 11/04/2019  . Complication of vascular access for dialysis 11/20/2017  . Hyperlipidemia 03/26/2017  . ESRD on dialysis ([place])  03/26/2017  . Postop check 04/30/2016  . Fever chills 09/18/2015  . Bacteremia 09/18/2015  . Acute on chronic renal insufficiency 09/01/2015  . Hyperkalemia 09/01/2015  . MRSA bacteremia secondary to PICC line infection 09/01/2015  . Type 2 diabetes mellitus ([place]) 09/01/2015  . UTI (lower urinary tract infection) 08/06/2015  . Idiopathic thrombocytopenic purpura ([place]) 08/02/2015  . B12 deficiency 07/24/2015  . Pseudomonas infection 04/28/2014  . 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 Urinary urgency 02/21/2013  . Hypertrophy of prostate with urinary obstruction and other lower urinary tract symptoms (LUTS) 11/18/2012  . Calculus of kidney 03/12/2012  . Increased frequency of urination 03/12/2012  . Nocturia 03/12/2012  . 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 Urethral stricture 03/12/2012  . Urinary tract infection 03/12/2012  . Essential (primary) hypertension 03/31/2011  . Hypothyroidism (acquired) 03/31/2011  . Chronic kidney disease, stage IV (severe) ([place]) 03/31/2011    Past Surgical History:  Procedure Laterality Date  . A/V FISTULAGRAM Left 03/31/2017   Procedure: A/V Fistulagram;  Surgeon: [name], MD;  Location: [place] [facility];  Service: Cardiovascular;  Laterality: Left;  . A/V FISTULAGRAM Left 09/07/2018   Procedure: A/V FISTULAGRAM;  Surgeon: [name], MD;  Location: [place] [facility];  Service: Cardiovascular;  Laterality: Left;  . A/V SHUNTOGRAM Left 07/28/2017   Procedure: A/V SHUNTOGRAM;  Surgeon: [name], MD;  Location: [place] [facility];  Service: Cardiovascular;  Laterality: Left;  . A/V SHUNTOGRAM Left 03/24/2018   Procedure: A/V SHUNTOGRAM;  Surgeon: [name], MD;  Location: [place] [facility];  Service: Cardiovascular;  Laterality: Left;  . APPENDECTOMY    . AV FISTULA PLACEMENT Left 04/16/2016   Procedure: INSERTION OF ARTERIOVENOUS (AV) GORE-TEX GRAFT ARM;  Surgeon: [name], MD;   Location: [facility] OR[facility];  Service: Vascular;  Laterality: Left;  . CATARACT EXTRACTION W/ INTRAOCULAR LENS IMPLANT Bilateral 2014   done 1-2 months apart at [facility].  [name] FINGER SURGERY Left 2002   pinky finger  . KIDNEY STONE SURGERY    . KYPHOPLASTY [name] 09/16/2018   Procedure: KYPHOPLASTY L3, DIABETIC;  Surgeon: Hessie Knows, MD;  Location: [facility] OR[facility];  Service: Orthopedics;  Laterality: [name];  . NASAL SINUS SURGERY  1985  . PERIPHERAL VASCULAR CATHETERIZATION  05/13/2016   Procedure: Upper Extremity Angiography;  Surgeon: [name], MD;  Location: [place] [facility];  Service: Cardiovascular;;  . PERIPHERAL VASCULAR CATHETERIZATION  05/13/2016   Procedure: Upper Extremity Intervention;  Surgeon: [name], MD;  Location: [place] [facility];  Service: Cardiovascular;;  . PICC LINE PLACE PERIPHERAL ([place] HX) Right 08/2015  . PICC LINE REMOVAL ([place] HX) Right 09/2015    Prior to Admission medications   Medication Sig Start Date End Date Taking? Authorizing Provider  acetaminophen (TYLENOL) 500 MG tablet Take 1,000 mg by mouth every 6 (six) hours as needed for moderate pain or headache.     [provider]  amLODipine (NORVASC) 5 MG tablet Take 5 mg by mouth daily.    [provider]  aspirin EC 81 MG EC tablet Take 1 tablet (81 mg total) by mouth daily. 11/05/19   Lavina Hamman, MD  atorvastatin (LIPITOR) 10 MG tablet Take 10 mg by mouth daily.    [provider]  B Complex-C-Folic Acid (RENA-VITE PO) Take 1 tablet by mouth daily.    [provider]  Carboxymethylcellulose Sodium (THERATEARS) 0.25 % SOLN Place 1-2 drops into both eyes 3 (three) times daily as needed (for dry eyes).    [provider]  Cenegermin-bkbj (OXERVATE) 0.002 % SOLN  07/18/19   [provider]  cetirizine (ZYRTEC) 10 MG tablet Take by mouth. 09/09/18   [provider]  Cholecalciferol (VITAMIN D3) 1.25 MG (50000 UT) CAPS Take by mouth once a week.     [provider]  cyanocobalamin (,VITAMIN B-12,) 1000 MCG/ML injection INJECT 1ML INTRAMUSCULARLY ONCE EVERY WEEK X4 WEEKS 02/14/19   [provider]  furosemide (LASIX) 40 MG tablet Take 40 mg by mouth 2 (two) times daily. In the morning and in the afternoon    [provider]  hydrALAZINE (APRESOLINE) 25 MG tablet Take 25 mg by mouth 2 (two) times daily.    [provider]  levothyroxine (SYNTHROID) 112 MCG tablet Take 112 mcg by mouth daily before breakfast.    [provider]  lidocaine-prilocaine (EMLA) cream Apply 1 application topically daily as needed (port access).  10/03/16   [provider]  lisinopril (PRINIVIL,ZESTRIL) 20 MG tablet Take 20 mg by mouth daily.  06/24/12   [provider]  multivitamin (RENA-VIT) TABS tablet Take 1 tablet by mouth daily.    [provider]  neomycin-polymyxin-dexamethasone (MAXITROL) 0.1 % ophthalmic suspension APPLY 1 DROP(S) IN RIGHT EYE 4 TIMES A DAY FOR 14 DAYS 04/06/19   [provider]  Omega-3 1000 MG CAPS Take 2,000 mg by mouth 2 (two) times daily.     [provider]  predniSONE (DELTASONE) 10 MG tablet  TAKE 3 TABLETS FOR 2 DAYS, TAKE 2 TABLETS FOR 2 DAY, TAKE 1 TABLETS FOR 2 DAYS 12/13/19   [provider]  predniSONE (DELTASONE) 10 MG tablet  01/03/20   [provider]  traMADol (ULTRAM) 50 MG tablet TAKE 2 TABLETS BY MOUTH EVERY 6 (SIX) HOURS AS NEEDED FOR MODERATE PAIN OR SEVERE PAIN 09/07/18   [provider]    Allergies Patient has no known allergies.  Family History  Problem Relation Age of Onset  . Cervical cancer Sister     Social History Social History   Tobacco Use  . Smoking status: Never Smoker  . Smokeless tobacco: Never Used  Vaping Use  . Vaping Use: Never used  Substance Use Topics  . Alcohol use: No    Alcohol/week: 0.0 standard drinks  . Drug use: No    Review of Systems  Constitutional: No  fever/chills.  Positive generalized weakness Eyes: No visual changes. ENT: No sore throat. Cardiovascular: Denies chest pain. Respiratory: Denies shortness of breath. Gastrointestinal: No abdominal pain.  No nausea, no vomiting.  No diarrhea.  No constipation. Genitourinary: Negative for dysuria. Musculoskeletal: Negative for back pain. Skin: Negative for rash. Neurological: Negative for headaches, focal weakness or numbness.  ____________________________________________   PHYSICAL EXAM:  VITAL SIGNS: Vitals:   04/12/20 1440  BP: (!) 144/61  Pulse: (!) 58  Resp: 16  Temp: 97.6 F (36.4 C)  SpO2: 100%      Constitutional: Alert and oriented. Well appearing and in no acute distress.  Extremely hard of hearing Eyes: Conjunctivae are normal. PERRL. EOMI. Head: Atraumatic. Nose: No congestion/rhinnorhea. Mouth/Throat: Mucous membranes are dry.  Oropharynx non-erythematous. Neck: No stridor. No cervical spine tenderness to palpation. Cardiovascular: Normal rate, regular rhythm. Grossly normal heart sounds.  Good peripheral circulation. Respiratory: No retractions.  Minimal tachypnea to the low 20s, otherwise no evidence of distress.  Faint bibasilar crackles, otherwise clear.  Good air movement throughout. Gastrointestinal: Soft , nondistended, nontender to palpation. No abdominal bruits. No CVA tenderness. Musculoskeletal: No lower extremity tenderness nor edema.  No joint effusions. No signs of acute trauma. LUE fistula in place with palpable thrill and distally neurovascularly intact. Neurologic:  Normal speech and language. No gross focal neurologic deficits are appreciated. No gait instability noted. Skin:  Skin is warm, dry and intact. No rash noted. Psychiatric: Mood and affect are normal. Speech and behavior are normal.  ____________________________________________   LABS (all labs ordered are listed, but only abnormal results are displayed)  Labs Reviewed  BASIC  METABOLIC PANEL - Abnormal; Notable for the following components:      Result Value   Potassium 6.8 (*)    CO2 17 (*)    Glucose, Bld 198 (*)    BUN 68 (*)    Creatinine, Ser 12.18 (*)    GFR, Estimated 4 (*)    All other components within normal limits  CBC - Abnormal; Notable for the following components:   RBC 3.41 (*)    Hemoglobin 11.2 (*)    HCT 33.9 (*)    nRBC 0.3 (*)    All other components within normal limits  RESPIRATORY PANEL BY RT PCR (FLU A&B, COVID)  URINALYSIS, COMPLETE (UACMP) WITH MICROSCOPIC   ____________________________________________  12 Lead EKG  Sinus rhythm, rate of 61 bpm.  Normal axis and intervals.  No evidence of acute ischemia.  No discrete changes of hyperkalemia noted. ____________________________________________  RADIOLOGY  ED MD interpretation: 1 view CXR reviewed by me with streaky  bilateral opacities consistent with COVID-19, without evidence of discrete lobar filtration to suggest a bacterial pneumonia.  Official radiology report(s): DG Chest Portable 1 View  Result Date: 04/12/2020 CLINICAL DATA:  COVID positive, ESRD. EXAM: PORTABLE CHEST 1 VIEW COMPARISON:  Nov 04, 2019 FINDINGS: Stable cardiomediastinal silhouette. Subtle opacities in the left lateral lung base. No visible pleural effusions or pneumothorax. Hiatal hernia. Vascular stents in the left subclavian left axillary regions. IMPRESSION: Subtle opacities in the left lateral lung base, which may represent early pneumonia given reported COVID diagnosis. Electronically Signed   By: Margaretha Sheffield MD   On: 04/12/2020 15:53    ____________________________________________   PROCEDURES and INTERVENTIONS  Procedure(s) performed (including Critical Care):  .1-3 Lead EKG Interpretation Performed by: Vladimir Crofts, MD Authorized by: Vladimir Crofts, MD     Interpretation: normal     ECG rate:  64   ECG rate assessment: normal     Rhythm: sinus rhythm     Ectopy: none      Conduction: normal   .Critical Care Performed by: Vladimir Crofts, MD Authorized by: Vladimir Crofts, MD   Critical care provider statement:    Critical care time (minutes):  45   Critical care was necessary to treat or prevent imminent or life-threatening deterioration of the following conditions:  Metabolic crisis and renal failure   Critical care was time spent personally by me on the following activities:  Discussions with consultants, evaluation of patient's response to treatment, examination of patient, ordering and performing treatments and interventions, ordering and review of laboratory studies, ordering and review of radiographic studies, pulse oximetry, re-evaluation of patient's condition, obtaining history from patient or surrogate and review of old charts    Medications  sodium zirconium cyclosilicate (LOKELMA) packet 10 g (has no administration in time range)  calcium gluconate 2 g/ 100 mL sodium chloride IVPB (has no administration in time range)  albuterol (PROVENTIL) (2.5 MG/3ML) 0.083% nebulizer solution 10 mg (10 mg Nebulization Given 04/12/20 1544)  insulin aspart (novoLOG) injection 10 Units (10 Units Intravenous Given 04/12/20 1545)    And  dextrose 50 % solution 50 mL (50 mLs Intravenous Given 04/12/20 1546)  sodium chloride 0.9 % bolus 500 mL (500 mLs Intravenous New Bag/Given 04/12/20 1544)  sodium bicarbonate injection 50 mEq (50 mEq Intravenous Given 04/12/20 1605)    ____________________________________________   MDM / ED COURSE  Unvaccinated 86 year old male with ESRD presents to the ED with generalized weakness, found to have severe hyperkalemia requiring urgent dialysis and subsequent medical admission.  Hemodynamically stable and not hypoxic on room air.  Exam with a very hard of hearing patient to his in no distress and has no complaints beyond his generalized weakness and poor p.o. intake.  Blood work demonstrates hyperkalemia to 6.8, without EKG changes.   Temporizing measures provided.  Consulted nephrology who plans to urgently dialyze the patient.  And will admit the patient to hospitalist medicine for further work-up and management of his Covid illness and associated hyperkalemia.  Clinical Course as of Apr 13 1607  Thu Apr 12, 2020  1537 Called nephro, Dr. Holley Raring, who is familiar with the patient and indicates that he will arrange urgent dialysis for hyperkalemia.  Requests hospitalist medical admission, and I paged the hospitalist.   [DS]  1608 Admitting hospitalist has seen the patient.  Agrees to admit.   [DS]    Clinical Course User Index [DS] Vladimir Crofts, MD     ____________________________________________   FINAL CLINICAL IMPRESSION(S) / ED  DIAGNOSES  Final diagnoses:  Generalized weakness  Hyperkalemia  ESRD (end stage renal disease) on dialysis ([place])  [place]     ED Discharge Orders    None       Taziah Difatta   Note:  This document was prepared using Systems analyst and may include unintentional dictation errors.   Vladimir Crofts, MD 04/12/20 [id number]

## 2020-04-12 NOTE — ED Triage Notes (Addendum)
Patient arrived POV by wife with c/o weakness and fatigue. Reports he was diagnosed with covid on Wednesday 04/11/20. Receives dialysis M,W,F in left arm. Reports he has not been to dialysis since 04/06/20 because of not feeling well. Patient hard of hearing. Denies chest pain

## 2020-04-12 NOTE — Progress Notes (Signed)
Pt well known to [place], here with hyperkalemia, will plan for HD tonight, 1k bath x 1 hour, then 2k for remainder of treatment.

## 2020-04-12 NOTE — H&P (Signed)
[facility] [place] NAME: Alejandro Armstrong    MR#:  [id number]  DATE OF BIRTH:  May 23, 1930  DATE OF ADMISSION:  04/12/2020  PRIMARY CARE PHYSICIAN: Casilda Carls, MD   REQUESTING/REFERRING PHYSICIAN: Dr. Vladimir Crofts  CHIEF COMPLAINT:   Chief Complaint  Patient presents with  . Weakness  . Fatigue    HISTORY OF PRESENT ILLNESS:  Alejandro Armstrong  is a 86 y.o. male with a known history of end-stage renal disease has not had dialysis since Friday.  He has not been feeling well.  He has been feeling tired and fatigued. His wife recently had Covid.  He has not felt well enough to go to dialysis on Monday or Wednesday.  He went to [facility] and was told he was Covid positive.  [name].  Slight shortness of breath.  In the ER, he was found to be hyperkalemic with a potassium of 6.8.  Hospitalist services were contacted for further evaluation.  PAST MEDICAL HISTORY:   Past Medical History:  Diagnosis Date  . Arthritis   . Dialysis patient [facility])    Mon, Wed Fri  . Hyperlipidemia   . Hypertension   . Hypothyroidism   . Idiopathic thrombocytopenia purpura ([place])   . Kidney stones   . Recurrent UTI   . 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 Type 2 diabetes mellitus ([place])   . Ureteral stricture     PAST SURGICAL HISTORY:   Past Surgical History:  Procedure Laterality Date  . A/V FISTULAGRAM Left 03/31/2017   Procedure: A/V Fistulagram;  Surgeon: [name], MD;  Location: [place] [facility];  Service: Cardiovascular;  Laterality: Left;  . A/V FISTULAGRAM Left 09/07/2018   Procedure: A/V FISTULAGRAM;  Surgeon: [name], MD;  Location: [place] [facility];  Service: Cardiovascular;  Laterality: Left;  . A/V SHUNTOGRAM Left 07/28/2017   Procedure: A/V SHUNTOGRAM;  Surgeon: [name], MD;  Location: [place] [facility];  Service: Cardiovascular;  Laterality: Left;  . A/V  SHUNTOGRAM Left 03/24/2018   Procedure: A/V SHUNTOGRAM;  Surgeon: [name], MD;  Location: [place] [facility];  Service: Cardiovascular;  Laterality: Left;  . APPENDECTOMY    . AV FISTULA PLACEMENT Left 04/16/2016   Procedure: INSERTION OF ARTERIOVENOUS (AV) GORE-TEX GRAFT ARM;  Surgeon: [name], MD;  Location: [facility];  Service: Vascular;  Laterality: Left;  . CATARACT EXTRACTION W/ INTRAOCULAR LENS IMPLANT Bilateral 2014   done 1-2 months apart at [facility].  [name] FINGER SURGERY Left 2002   pinky finger  . KIDNEY STONE SURGERY    . KYPHOPLASTY [name] 09/16/2018   Procedure: KYPHOPLASTY L3, DIABETIC;  Surgeon: Hessie Knows, MD;  Location: [facility] OR[facility];  Service: Orthopedics;  Laterality: [name];  . NASAL SINUS SURGERY  1985  . PERIPHERAL VASCULAR CATHETERIZATION  05/13/2016   Procedure: Upper Extremity Angiography;  Surgeon: [name], MD;  Location: [place] [facility];  Service: Cardiovascular;;  . PERIPHERAL VASCULAR CATHETERIZATION  05/13/2016   Procedure: Upper Extremity Intervention;  Surgeon: [name], MD;  Location: [place] [facility];  Service: Cardiovascular;;  . PICC LINE PLACE PERIPHERAL ([place] HX) Right 08/2015  . PICC LINE REMOVAL ([place] HX) Right 09/2015    SOCIAL HISTORY:   Social History   Tobacco Use  . Smoking status: Never Smoker  . Smokeless tobacco: Never Used  Substance Use Topics  .  Alcohol use: No    Alcohol/week: 0.0 standard drinks    FAMILY HISTORY:   Family History  Problem Relation Age of Onset  . Cervical cancer Sister   . CAD Father     DRUG ALLERGIES:  No Known Allergies  REVIEW OF SYSTEMS:  CONSTITUTIONAL: No fever, chills or sweats.  Positive for fatigue. EYES: Wears glasses. EARS, NOSE, AND THROAT: No tinnitus or ear pain. No sore throat.  RESPIRATORY: Slight cough, slight shortness of breath.  No wheezing or hemoptysis.  CARDIOVASCULAR: No chest pain, orthopnea, edema.  GASTROINTESTINAL: No nausea,  vomiting.some diarrhea.  No abdominal pain. No blood in bowel movements GENITOURINARY: No dysuria, hematuria.  ENDOCRINE: No polyuria, nocturia,  HEMATOLOGY: No anemia, easy bruising or bleeding SKIN: No rash or lesion. MUSCULOSKELETAL: No joint pain or arthritis.   NEUROLOGIC: No tingling, numbness, weakness.  PSYCHIATRY: No anxiety or depression.   MEDICATIONS AT HOME:   Prior to Admission medications   Medication Sig Start Date End Date Taking? Authorizing Provider  acetaminophen (TYLENOL) 500 MG tablet Take 1,000 mg by mouth every 6 (six) hours as needed for moderate pain or headache.     [provider]  amLODipine (NORVASC) 5 MG tablet Take 5 mg by mouth daily.    [provider]  aspirin EC 81 MG EC tablet Take 1 tablet (81 mg total) by mouth daily. 11/05/19   Lavina Hamman, MD  atorvastatin (LIPITOR) 10 MG tablet Take 10 mg by mouth daily.    [provider]  B Complex-C-Folic Acid (RENA-VITE PO) Take 1 tablet by mouth daily.    [provider]  Carboxymethylcellulose Sodium (THERATEARS) 0.25 % SOLN Place 1-2 drops into both eyes 3 (three) times daily as needed (for dry eyes).    [provider]  Cenegermin-bkbj (OXERVATE) 0.002 % SOLN  07/18/19   [provider]  cetirizine (ZYRTEC) 10 MG tablet Take by mouth. 09/09/18   [provider]  Cholecalciferol (VITAMIN D3) 1.25 MG (50000 UT) CAPS Take by mouth once a week.    [provider]  cyanocobalamin (,VITAMIN B-12,) 1000 MCG/ML injection INJECT 1ML INTRAMUSCULARLY ONCE EVERY WEEK X4 WEEKS 02/14/19   [provider]  furosemide (LASIX) 40 MG tablet Take 40 mg by mouth 2 (two) times daily. In the morning and in the afternoon    [provider]  hydrALAZINE (APRESOLINE) 25 MG tablet Take 25 mg by mouth 2 (two) times daily.    [provider]  levothyroxine (SYNTHROID) 112 MCG tablet Take 112 mcg by mouth daily before breakfast.    [provider]  lidocaine-prilocaine (EMLA) cream Apply 1 application topically daily as needed (port access).  10/03/16   [provider]  lisinopril (PRINIVIL,ZESTRIL) 20 MG tablet Take 20 mg by mouth daily.  06/24/12   [provider]  multivitamin (RENA-VIT) TABS tablet Take 1 tablet by mouth daily.    [provider]  neomycin-polymyxin-dexamethasone (MAXITROL) 0.1 % ophthalmic suspension APPLY 1 DROP(S) IN RIGHT EYE 4 TIMES A DAY FOR 14 DAYS 04/06/19   [provider]  Omega-3 1000 MG CAPS Take 2,000 mg by mouth 2 (two) times daily.     [provider]  predniSONE (DELTASONE) 10 MG tablet TAKE 3 TABLETS FOR 2 DAYS, TAKE 2 TABLETS FOR 2 DAY, TAKE 1 TABLETS FOR 2 DAYS 12/13/19   [provider]  predniSONE (DELTASONE) 10 MG tablet  01/03/20   [provider]  traMADol (ULTRAM) 50 MG tablet  TAKE 2 TABLETS BY MOUTH EVERY 6 (SIX) HOURS AS NEEDED FOR MODERATE PAIN OR SEVERE PAIN 09/07/18   [provider]   Pharmacy tech to review medications and reconcile.  VITAL SIGNS:  Blood pressure (!) 144/61, pulse (!) 58, temperature 97.6 F (36.4 C), temperature source Oral, resp. rate 16, height 5' 7.5" (1.715 m), weight 72.6 kg, SpO2 100 %.  PHYSICAL EXAMINATION:  GENERAL:  86 y.o.-year-old patient lying in the bed with no acute distress.  EYES: Pupils equal, round, reactive to light and accommodation. No scleral icterus. Extraocular muscles intact.  HEENT: Head atraumatic, normocephalic. Oropharynx and nasopharynx clear.  NECK:  Supple, no jugular venous distention..  LUNGS: Decreased breath sounds bilaterally, slight rhonchi at the bases.  No use of accessory muscles of respiration.  CARDIOVASCULAR: S1, S2 normal. No murmurs, rubs, or gallops.  ABDOMEN: Soft, nontender, nondistended.  EXTREMITIES: No pedal edema.  NEUROLOGIC: Cranial nerves II through XII are intact except hearing impaired.  Muscle strength 5/5 in all extremities.  Sensation intact. Gait not checked.  PSYCHIATRIC: The patient is alert and oriented x 3.  SKIN: No rash, lesion, or ulcer.   LABORATORY PANEL:   CBC Recent Labs  Lab 04/12/20 1449  WBC 6.2  HGB 11.2*  HCT 33.9*  PLT 175   ------------------------------------------------------------------------------------------------------------------  Chemistries  Recent Labs  Lab 04/12/20 1449  NA 140  K 6.8*  CL 110  CO2 17*  GLUCOSE 198*  BUN 68*  CREATININE 12.18*  CALCIUM 9.6   ------------------------------------------------------------------------------------------------------------------   RADIOLOGY:  DG Chest Portable 1 View  Result Date: 04/12/2020 CLINICAL DATA:  COVID positive, ESRD. EXAM: PORTABLE CHEST 1 VIEW COMPARISON:  Nov 04, 2019 FINDINGS: Stable cardiomediastinal silhouette. Subtle opacities in the left lateral lung base. No visible pleural effusions or pneumothorax. Hiatal hernia. Vascular stents in the left subclavian left axillary regions. IMPRESSION: Subtle opacities in the left lateral lung base, which may represent early pneumonia given reported COVID diagnosis. Electronically Signed   By: Margaretha Sheffield MD   On: 04/12/2020 15:53    EKG:   Interpreted by me.  Normal sinus rhythm 61 bpm, LVH.  IMPRESSION AND PLAN:   1.  Severe hyperkalemia.  Short acting medications ordered including calcium, insulin and D50, sodium bicarb, Lokelma.  Nephrology consulted for dialysis. 2.  Suspected Covid pneumonia.  ER physician ordering a Covid test.  If this comes back positive he is a candidate for remdesivir with pneumonia seen on chest x-ray.  Since he is high likelihood of being Covid positive since his wife just had this infection.  If Covid positive will start remdesivir.  Patient currently on room air saturating well. 3.  Weakness physical therapy evaluation for tomorrow 4.  Impaired fasting glucose check a hemoglobin A1c 5.  Hyperlipidemia unspecified clarify  his cholesterol medication 6.  Essential hypertension.  We will order his blood pressure medications once clarified. 7.  Hypothyroidism unspecified continue levothyroxine. 8.  DNR status ordered and confirmed by me  All the records, laboratory and radiological data are reviewed and case discussed with ED provider. Management plans discussed with the patient, family and they are in agreement.  Patient requires inpatient hospital status with severe hyperkalemia and likely being Covid positive.  CODE STATUS: DNR  TOTAL TIME TAKING CARE OF THIS PATIENT: 50 minutes.    Loletha Grayer M.D on 04/12/2020 at 4:23 PM  Between 7am to 6pm - Pager - [phone]  After 6pm call admission pager [phone]  [facility]  CC: Primary  care physician; Casilda Carls, MD

## 2020-04-12 NOTE — Progress Notes (Signed)
Remdesivir - Pharmacy Brief Note   O:  CXR: Pneumonia   A/P:  Remdesivir 200 mg IVPB once followed by 100 mg IVPB daily x 4 days.   Paulina Fusi, PharmD, BCPS 04/12/2020 6:39 PM

## 2020-04-13 LAB — HEPATITIS PANEL, ACUTE
HCV Ab: NONREACTIVE
Hep A IgM: NONREACTIVE
Hep B C IgM: NONREACTIVE
Hepatitis B Surface Ag: NONREACTIVE

## 2020-04-13 LAB — COMPREHENSIVE METABOLIC PANEL
ALT: 13 U/L (ref 0–44)
AST: 21 U/L (ref 15–41)
Albumin: 3.6 g/dL (ref 3.5–5.0)
Alkaline Phosphatase: 84 U/L (ref 38–126)
Anion gap: 15 (ref 5–15)
BUN: 54 mg/dL — ABNORMAL HIGH (ref 8–23)
CO2: 20 mmol/L — ABNORMAL LOW (ref 22–32)
Calcium: 9.3 mg/dL (ref 8.9–10.3)
Chloride: 104 mmol/L (ref 98–111)
Creatinine, Ser: 9.75 mg/dL — ABNORMAL HIGH (ref 0.61–1.24)
GFR, Estimated: 5 mL/min — ABNORMAL LOW (ref >60)
Glucose, Bld: 283 mg/dL — ABNORMAL HIGH (ref 70–99)
Potassium: 5.6 mmol/L — ABNORMAL HIGH (ref 3.5–5.1)
Sodium: 139 mmol/L (ref 135–145)
Total Bilirubin: 0.9 mg/dL (ref 0.3–1.2)
Total Protein: 7.5 g/dL (ref 6.5–8.1)

## 2020-04-13 LAB — FIBRIN DERIVATIVES D-DIMER (ARMC ONLY): Fibrin derivatives D-dimer (ARMC): 2134.65 ng/mL (FEU) — ABNORMAL HIGH (ref 0.00–499.00)

## 2020-04-13 LAB — CBC WITH DIFFERENTIAL/PLATELET
Abs Immature Granulocytes: 0.02 10*3/uL (ref 0.00–0.07)
Basophils Absolute: 0 10*3/uL (ref 0.0–0.1)
Basophils Relative: 0 %
Eosinophils Absolute: 0 10*3/uL (ref 0.0–0.5)
Eosinophils Relative: 0 %
HCT: 29 % — ABNORMAL LOW (ref 39.0–52.0)
Hemoglobin: 9.7 g/dL — ABNORMAL LOW (ref 13.0–17.0)
Immature Granulocytes: 1 %
Lymphocytes Relative: 19 %
Lymphs Abs: 0.5 10*3/uL — ABNORMAL LOW (ref 0.7–4.0)
MCH: 32.9 pg (ref 26.0–34.0)
MCHC: 33.4 g/dL (ref 30.0–36.0)
MCV: 98.3 fL (ref 80.0–100.0)
Monocytes Absolute: 0.1 10*3/uL (ref 0.1–1.0)
Monocytes Relative: 2 %
Neutro Abs: 2.2 10*3/uL (ref 1.7–7.7)
Neutrophils Relative %: 78 %
Platelets: 130 10*3/uL — ABNORMAL LOW (ref 150–400)
RBC: 2.95 MIL/uL — ABNORMAL LOW (ref 4.22–5.81)
RDW: 14.8 % (ref 11.5–15.5)
WBC: 2.8 10*3/uL — ABNORMAL LOW (ref 4.0–10.5)
nRBC: 0 % (ref 0.0–0.2)

## 2020-04-13 LAB — PHOSPHORUS: Phosphorus: 4.1 mg/dL (ref 2.5–4.6)

## 2020-04-13 LAB — FERRITIN: Ferritin: 1076 ng/mL — ABNORMAL HIGH (ref 24–336)

## 2020-04-13 LAB — CBG MONITORING, ED: Glucose-Capillary: 193 mg/dL — ABNORMAL HIGH (ref 70–99)

## 2020-04-13 LAB — C-REACTIVE PROTEIN: CRP: 0.8 mg/dL (ref <1.0)

## 2020-04-13 MED ORDER — INSULIN ASPART 100 UNIT/ML ~~LOC~~ SOLN
2.0000 [IU] | Freq: Three times a day (TID) | SUBCUTANEOUS | Status: DC
  Administered 2020-04-13 – 2020-04-20 (×16): 2 [IU] via SUBCUTANEOUS
  Filled 2020-04-13 (×16): qty 1

## 2020-04-13 MED ORDER — METHYLPREDNISOLONE SODIUM SUCC 125 MG IJ SOLR
60.0000 mg | Freq: Two times a day (BID) | INTRAMUSCULAR | Status: DC
  Administered 2020-04-13 (×2): 60 mg via INTRAVENOUS
  Filled 2020-04-13 (×3): qty 2

## 2020-04-13 MED ORDER — PATIROMER SORBITEX CALCIUM 8.4 G PO PACK
16.8000 g | PACK | Freq: Every day | ORAL | Status: DC
  Administered 2020-04-13 – 2020-04-15 (×3): 16.8 g via ORAL
  Filled 2020-04-13 (×3): qty 2

## 2020-04-13 MED ORDER — INSULIN ASPART 100 UNIT/ML ~~LOC~~ SOLN
0.0000 [IU] | Freq: Three times a day (TID) | SUBCUTANEOUS | Status: DC
  Administered 2020-04-13: 1 [IU] via SUBCUTANEOUS
  Administered 2020-04-14 – 2020-04-15 (×2): 3 [IU] via SUBCUTANEOUS
  Administered 2020-04-15: 4 [IU] via SUBCUTANEOUS
  Administered 2020-04-15: 2 [IU] via SUBCUTANEOUS
  Administered 2020-04-17: 3 [IU] via SUBCUTANEOUS
  Administered 2020-04-17 – 2020-04-18 (×2): 1 [IU] via SUBCUTANEOUS
  Administered 2020-04-18: 3 [IU] via SUBCUTANEOUS
  Administered 2020-04-19 (×2): 4 [IU] via SUBCUTANEOUS
  Administered 2020-04-19 – 2020-04-20 (×4): 1 [IU] via SUBCUTANEOUS
  Filled 2020-04-13 (×15): qty 1

## 2020-04-13 NOTE — ED Notes (Signed)
Pt resting comfortably at this time. No distress  Noted. Pt denies any needs at this time. Call bell in reach.

## 2020-04-13 NOTE — ED Notes (Signed)
Assumed care of pt at 1900. Pt resting in room with eyes closed, awakens to RN entering room. Hard of hearing. Denies pain or sob at this time. AO x4. Talking in full sentences with regular and unlabored breathing. Side rails up x2, call bell within reach.

## 2020-04-13 NOTE — ED Notes (Signed)
Pt up ad lib, repositioned to stretcher with RN assistance. Warm blankets provided. Pillow placed under head and lower extremities for comfort. Lights dimmed. Bed alarm in place, side rails up x2, call bell within reach. AO x4.

## 2020-04-13 NOTE — ED Notes (Signed)
Pt refuses to keep cardiac monitor, BP cuff and , pulse ox on at this time, pt states "I don't need it".

## 2020-04-13 NOTE — ED Notes (Signed)
Unsuccessful IV start per 2 RN's

## 2020-04-13 NOTE — Progress Notes (Signed)
[facility]  ROUNDING NOTE   Subjective:  [name] is 86 years old gentleman known to our practice, has a h/o ESRD on dialysis MWF.He didn't get dialyzed on Monday or Wednesday this week as he was not feeling well to go to his outpatient dialysis center.He was diagnosed as COVID positive.Patient was hyperkalemic in the ED and received urgent dialysis last night.  Today,patient seen lying in bed,in no acute distress.It was difficult to communicate with him as he is very hard of hearing, it seems like his hearing aid is not working. We tried to communicate writing on the white board in the room, but he was not able to read it that far.   Objective:  Vital signs in last 24 hours:  Temp:  [97.6 F (36.4 C)-98.9 F (37.2 C)] 98.9 F (37.2 C) (10/29 0643) Pulse Rate:  [58-98] 68 (10/29 0643) Resp:  [15-18] 17 (10/29 0643) BP: (129-168)/(61-116) 129/76 (10/29 0643) SpO2:  [95 %-100 %] 96 % (10/29 0643) Weight:  [72.6 kg] 72.6 kg (10/28 1441)  Weight change:  Filed Weights   04/12/20 1441  Weight: 72.6 kg    Intake/Output: I/O last 3 completed shifts: In: -  Out: 1000 [Other:1000]   Intake/Output this shift:  No intake/output data recorded.  Physical Exam: General: No acute distress  Head: Normocephalic, atraumatic. Moist oral mucosal membranes  Eyes: Anicteric  Lungs:  Clear to auscultation,diminished at the bases  Heart: Regular rate and rhythm  Abdomen:  Soft, nontender,   Extremities:  No  peripheral edema.  Neurologic: Nonfocal, moving all four extremities,Hard of hearing  Skin: No lesions or rashes noted  Access: Lt Upper Arm AVF    Basic Metabolic Panel: Recent Labs  Lab 04/12/20 1449 04/12/20 1740 04/13/20 0646  NA 140  --  139  K 6.8*  --  5.6*  CL 110  --  104  CO2 17*  --  20*  GLUCOSE 198*  --  283*  BUN 68*  --  54*  CREATININE 12.18*  --  9.75*  CALCIUM 9.6  --  9.3  PHOS  --  4.1  --     Liver Function Tests: Recent Labs  Lab  04/13/20 0646  AST 21  ALT 13  ALKPHOS 84  BILITOT 0.9  PROT 7.5  ALBUMIN 3.6   No results for input(s): LIPASE, AMYLASE in the last 168 hours. No results for input(s): AMMONIA in the last 168 hours.  CBC: Recent Labs  Lab 04/12/20 1449 04/13/20 0646  WBC 6.2 2.8*  NEUTROABS  --  2.2  HGB 11.2* 9.7*  HCT 33.9* 29.0*  MCV 99.4 98.3  PLT 175 130*    Cardiac Enzymes: No results for input(s): CKTOTAL, CKMB, CKMBINDEX, TROPONINI in the last 168 hours.  BNP: Invalid input(s): POCBNP  CBG: Recent Labs  Lab 04/12/20 1810  GLUCAP 133*    Microbiology: Results for orders placed or performed during the hospital encounter of 04/12/20  Respiratory Panel by RT PCR (Flu A&B, Covid) - Nasopharyngeal Swab     Status: Abnormal   Collection Time: 04/12/20  3:56 PM   Specimen: Nasopharyngeal Swab  Result Value Ref Range Status   SARS Coronavirus 2 by RT PCR POSITIVE (A) NEGATIVE Final    Comment: RESULT CALLED TO, READ BACK BY AND VERIFIED WITH: [facility] REGISTER AT [id number] 04/12/2020 BY MOSLEY,J (NOTE) SARS-CoV-2 target nucleic acids are DETECTED.  SARS-CoV-2 RNA is generally detectable in upper respiratory specimens  during the acute phase of infection.  Positive results are indicative of the presence of the identified virus, but do not rule out bacterial infection or co-infection with other pathogens not detected by the test. Clinical correlation with patient history and other diagnostic information is necessary to determine patient infection status. The expected result is Negative.  Fact Sheet for Patients:  PinkCheek.be  Fact Sheet for Healthcare Providers: GravelBags.it  This test is not yet approved or cleared by the [place] FDA and  has been authorized for detection and/or diagnosis of SARS-CoV-2 by FDA under an Emergency Use Authorization (EUA).  This EUA will remain in effect (meaning this te st can be  used) for the duration of  the COVID-19 declaration under Section 564(b)(1) of the Act, 21 U.S.C. section 360bbb-3(b)(1), unless the authorization is terminated or revoked sooner.      Influenza A by PCR NEGATIVE NEGATIVE Final   Influenza B by PCR NEGATIVE NEGATIVE Final    Comment: (NOTE) The Xpert Xpress SARS-CoV-2/FLU/RSV assay is intended as an aid in  the diagnosis of influenza from Nasopharyngeal swab specimens and  should not be used as a sole basis for treatment. Nasal washings and  aspirates are unacceptable for Xpert Xpress SARS-CoV-2/FLU/RSV  testing.  Fact Sheet for Patients: PinkCheek.be  Fact Sheet for Healthcare Providers: GravelBags.it  This test is not yet approved or cleared by the [place] FDA and  has been authorized for detection and/or diagnosis of SARS-CoV-2 by  FDA under an Emergency Use Authorization (EUA). This EUA will remain  in effect (meaning this test can be used) for the duration of the  Covid-19 declaration under Section 564(b)(1) of the Act, 21  U.S.C. section 360bbb-3(b)(1), unless the authorization is  terminated or revoked. Performed at [facility], [address] [place], [place] [address]     Coagulation Studies: No results for input(s): LABPROT, INR in the last 72 hours.  Urinalysis: Recent Labs    04/12/20 1740  COLORURINE YELLOW*  LABSPEC 1.009  PHURINE 8.0  GLUCOSEU >=500*  HGBUR SMALL*  BILIRUBINUR NEGATIVE  KETONESUR NEGATIVE  PROTEINUR 100*  NITRITE NEGATIVE  LEUKOCYTESUR MODERATE*      Imaging: DG Chest Portable 1 View  Result Date: 04/12/2020 CLINICAL DATA:  COVID positive, ESRD. EXAM: PORTABLE CHEST 1 VIEW COMPARISON:  Nov 04, 2019 FINDINGS: Stable cardiomediastinal silhouette. Subtle opacities in the left lateral lung base. No visible pleural effusions or pneumothorax. Hiatal hernia. Vascular stents in the left subclavian left axillary  regions. IMPRESSION: Subtle opacities in the left lateral lung base, which may represent early pneumonia given reported COVID diagnosis. Electronically Signed   By: Margaretha Sheffield MD   On: 04/12/2020 15:53     Medications:   . sodium chloride    . sodium chloride    . remdesivir 100 mg in NS 100 mL     . albuterol  2 puff Inhalation Q6H  . amLODipine  5 mg Oral Daily  . vitamin C  500 mg Oral Daily  . aspirin EC  81 mg Oral Daily  . atorvastatin  10 mg Oral Daily  . Chlorhexidine Gluconate Cloth  6 each Topical Q0600  . furosemide  40 mg Oral BID  . heparin  5,000 Units Subcutaneous Q8H  . hydrALAZINE  25 mg Oral BID  . insulin aspart  0-6 Units Subcutaneous TID WC  . insulin aspart  2 Units Subcutaneous TID WC  . levothyroxine  125 mcg Oral QAC breakfast  . loratadine  10 mg Oral Daily  .  methylPREDNISolone (SOLU-MEDROL) injection  60 mg Intravenous Q12H  . multivitamin  1 tablet Oral Daily  . omega-3 acid ethyl esters  2 g Oral BID  . zinc sulfate  220 mg Oral Daily   sodium chloride, sodium chloride, acetaminophen, alteplase, guaiFENesin-dextromethorphan, heparin, lidocaine (PF), lidocaine-prilocaine, lidocaine-prilocaine, ondansetron **OR** ondansetron (ZOFRAN) IV, pentafluoroprop-tetrafluoroeth, polyvinyl alcohol  Assessment/ Plan:  Mr. Alejandro Armstrong is a 86 y.o.  male known to our practice, has a h/o ESRD on dialysis MWF.He didn't get dialyzed on Monday or Wednesday this week as he was not feeling well to go to his outpatient dialysis center.He was diagnosed as COVID positive.Patient was hyperkalemic in the ED and received urgent dialysis last night.  #ESRD on dialysis MWF [facility], Lt Upper Arm AVF,MWF  Patient missed dialysis on Monday and Wednesday as he was not feeling well Received  Dialysis emergently  last night due to hyperkalemia Volume status acceptable and electrolyte status better today Will assess him if he needs dialysis again tomorrow We will continue  MWF schedule  #Hyperkalemia Potassium level was 6.8 on arrival to ED last night K+ 5.6 post dialysis today Will treat with V[place]assa   # Anemia of Chronic Kidney Disease Hemoglobin 9.7 ,at goal No indication for Epogen  #Secondary Hyperparathyroidism  Phosphorus 4.1 Calcium 9.3 No indication for Phos binders at this point    LOS: 1 Alejandro Armstrong 10/29/20219:02 AM

## 2020-04-13 NOTE — ED Notes (Addendum)
Pt repositioned by assisting RN to recliner, pt intermittently resting and awakens with RN entering room. Up ad lib, pt removed his IV

## 2020-04-13 NOTE — ED Notes (Signed)
Pt resting comfortably at this time. Pt accidentally pulled IV out of place. Pt educated to use call bell. Call bell in reach.

## 2020-04-13 NOTE — ED Notes (Signed)
Dialysis complete.  One liter removed, pt tolerated well.

## 2020-04-13 NOTE — Progress Notes (Signed)
PROGRESS NOTE    Alejandro Armstrong  WIO:[id number] DOB: Feb 26, 1930 DOA: 04/12/2020 PCP: Casilda Carls, MD    Chief Complaint  Patient presents with  . Weakness  . Fatigue    Brief Narrative:  Alejandro Armstrong  is a 86 y.o. male with a known history of end-stage renal disease has not had dialysis since Friday.  He has not been feeling well.  He has been feeling tired and fatigued. His wife recently had Covid.  He has not felt well enough to go to dialysis on Monday or Wednesday.  he was  Tested Covid positive on 10/27.  [name].  Slight shortness of breath.  In the ER, he was found to be hyperkalemic with a potassium of 6.8.  Hospitalist services were contacted for further evaluation  Subjective:  He is very hard of hearing, he wants to go home  Assessment & Plan:   Active Problems:   Hyperkalemia   ESRD (end stage renal disease) on dialysis [facility])   Essential hypertension   Hypothyroidism   COVID-19 pneumonia -Chest x-ray personally reviewed showed left lateral lung base infiltrate -CRP 0.8 -Currently on 2 L oxygen -Continue remdesivir, change Decadron to Solu-Medrol, continue zinc and vitamin C -Monitor daily CRP and oxygen requirement   Hyperkalemia due to missed dialysis, receivedcalcium, insulin and D50, sodium bicarb, Lokelma and   HD on 10/28 night  ESRD on HD MWF, anemia of chronic disease Plan per nephrology  Diet controlled diabetes With hyperglycemia, expect worsening of hyperglycemia with steroid use Start meal coverage and SSI A1c in process  DVT prophylaxis: heparin injection 5,000 Units Start: 04/12/20 1630   Code Status:DNR Family Communication: will call family  Disposition:   Status is: Inpatient  Dispo: The patient is from: home              Anticipated d/c is to: TBD              Anticipated d/c date is: TBD                Consultants:   nephrology  Procedures:   dialysis  Antimicrobials:         Objective: Vitals:   04/13/20 0115 04/13/20 0258 04/13/20 0621 04/13/20 0643  BP: (!) 146/76 (!) 140/116 (!) 142/89 129/76  Pulse: 88 74 72 68  Resp: 17 18 15 17   Temp:  97.6 F (36.4 C) 97.9 F (36.6 C) 98.9 F (37.2 C)  TempSrc:  Oral Oral Oral  SpO2: 95% 96%  96%  Weight:      Height:        Intake/Output Summary (Last 24 hours) at 04/13/2020 0848 Last data filed at 04/13/2020 0106 Gross per 24 hour  Intake --  Output 1000 ml  Net -1000 ml   Filed Weights   04/12/20 1441  Weight: 72.6 kg    Examination:  General exam: calm, NAD, hard of hearing Respiratory system: diminished , no wheezing, Respiratory effort normal. Cardiovascular system: S1 & S2 heard, RRR. No JVD, no murmur, No pedal edema. Gastrointestinal system: Abdomen is nondistended, soft and nontender. Normal bowel sounds heard. Central nervous system: Alert and oriented, but very hard of hearing. No focal neurological deficits. Extremities: Symmetric 5 x 5 power. Skin: No rashes, lesions or ulcers Psychiatry: Appear anxious    Data Reviewed: I have personally reviewed following labs and imaging studies  CBC: Recent Labs  Lab 04/12/20 1449 04/13/20 0646  WBC 6.2 2.8*  NEUTROABS  --  2.2  HGB 11.2* 9.7*  HCT 33.9* 29.0*  MCV 99.4 98.3  PLT 175 130*    Basic Metabolic Panel: Recent Labs  Lab 04/12/20 1449 04/12/20 1740 04/13/20 0646  NA 140  --  139  K 6.8*  --  5.6*  CL 110  --  104  CO2 17*  --  20*  GLUCOSE 198*  --  283*  BUN 68*  --  54*  CREATININE 12.18*  --  9.75*  CALCIUM 9.6  --  9.3  PHOS  --  4.1  --     GFR: Estimated Creatinine Clearance: 5 mL/min (A) (by C-G formula based on SCr of 9.75 mg/dL (H)).  Liver Function Tests: Recent Labs  Lab 04/13/20 0646  AST 21  ALT 13  ALKPHOS 84  BILITOT 0.9  PROT 7.5  ALBUMIN 3.6    CBG: Recent Labs  Lab 04/12/20 1810  GLUCAP 133*     Recent Results (from the past 240 hour(s))  Respiratory Panel by  RT PCR (Flu A&B, Covid) - Nasopharyngeal Swab     Status: Abnormal   Collection Time: 04/12/20  3:56 PM   Specimen: Nasopharyngeal Swab  Result Value Ref Range Status   SARS Coronavirus 2 by RT PCR POSITIVE (A) NEGATIVE Final    Comment: RESULT CALLED TO, READ BACK BY AND VERIFIED WITH: [facility] REGISTER AT 5643 04/12/2020 BY MOSLEY,J (NOTE) SARS-CoV-2 target nucleic acids are DETECTED.  SARS-CoV-2 RNA is generally detectable in upper respiratory specimens  during the acute phase of infection. Positive results are indicative of the presence of the identified virus, but do not rule out bacterial infection or co-infection with other pathogens not detected by the test. Clinical correlation with patient history and other diagnostic information is necessary to determine patient infection status. The expected result is Negative.  Fact Sheet for Patients:  PinkCheek.be  Fact Sheet for Healthcare Providers: GravelBags.it  This test is not yet approved or cleared by the [place] FDA and  has been authorized for detection and/or diagnosis of SARS-CoV-2 by FDA under an Emergency Use Authorization (EUA).  This EUA will remain in effect (meaning this te st can be used) for the duration of  the COVID-19 declaration under Section 564(b)(1) of the Act, 21 U.S.C. section 360bbb-3(b)(1), unless the authorization is terminated or revoked sooner.      Influenza A by PCR NEGATIVE NEGATIVE Final   Influenza B by PCR NEGATIVE NEGATIVE Final    Comment: (NOTE) The Xpert Xpress SARS-CoV-2/FLU/RSV assay is intended as an aid in  the diagnosis of influenza from Nasopharyngeal swab specimens and  should not be used as a sole basis for treatment. Nasal washings and  aspirates are unacceptable for Xpert Xpress SARS-CoV-2/FLU/RSV  testing.  Fact Sheet for Patients: PinkCheek.be  Fact Sheet for Healthcare  Providers: GravelBags.it  This test is not yet approved or cleared by the [place] FDA and  has been authorized for detection and/or diagnosis of SARS-CoV-2 by  FDA under an Emergency Use Authorization (EUA). This EUA will remain  in effect (meaning this test can be used) for the duration of the  Covid-19 declaration under Section 564(b)(1) of the Act, 21  U.S.C. section 360bbb-3(b)(1), unless the authorization is  terminated or revoked. Performed at [facility], [address], [place], [place] [address]          Radiology Studies: DG Chest Portable 1 View  Result Date: 04/12/2020 CLINICAL DATA:  COVID positive,  ESRD. EXAM: PORTABLE CHEST 1 VIEW COMPARISON:  Nov 04, 2019 FINDINGS: Stable cardiomediastinal silhouette. Subtle opacities in the left lateral lung base. No visible pleural effusions or pneumothorax. Hiatal hernia. Vascular stents in the left subclavian left axillary regions. IMPRESSION: Subtle opacities in the left lateral lung base, which may represent early pneumonia given reported COVID diagnosis. Electronically Signed   By: Margaretha Sheffield MD   On: 04/12/2020 15:53        Scheduled Meds: . albuterol  2 puff Inhalation Q6H  . amLODipine  5 mg Oral Daily  . vitamin C  500 mg Oral Daily  . aspirin EC  81 mg Oral Daily  . atorvastatin  10 mg Oral Daily  . Chlorhexidine Gluconate Cloth  6 each Topical Q0600  . furosemide  40 mg Oral BID  . heparin  5,000 Units Subcutaneous Q8H  . hydrALAZINE  25 mg Oral BID  . levothyroxine  125 mcg Oral QAC breakfast  . loratadine  10 mg Oral Daily  . methylPREDNISolone (SOLU-MEDROL) injection  60 mg Intravenous Q12H  . multivitamin  1 tablet Oral Daily  . omega-3 acid ethyl esters  2 g Oral BID  . zinc sulfate  220 mg Oral Daily   Continuous Infusions: . sodium chloride    . sodium chloride    . remdesivir 100 mg in NS 100 mL       LOS: 1 day   Time spent: 52mins Greater  than 50% of this time was spent in counseling, explanation of diagnosis, planning of further management, and coordination of care.  I have personally reviewed and interpreted on  04/13/2020 daily labs,I reviewed all nursing notes, pharmacy notes, consultant notes,  vitals, pertinent old records  I have discussed plan of care as described above with RN , patient on 04/13/2020  Voice Recognition /Dragon dictation system was used to create this note, attempts have been made to correct errors. Please contact the author with questions and/or clarifications.   Florencia Reasons, MD PhD FACP [facility]  Available via Epic secure chat 7am-7pm for nonurgent issues Please page for urgent issues To page the attending provider between 7A-7P or the covering provider during after hours 7P-7A, please log into the web site [email or web address] and access using universal [place] password for that web site. If you do not have the password, please call the hospital operator.    04/13/2020, 8:48 AM

## 2020-04-13 NOTE — ED Notes (Signed)
Pt spilled his breakfast on him, pt cleaned up and full bed change done, pt given a recliner per request and pt is currently sitting upright in recliner, call bell in reach. No distress noted.

## 2020-04-14 ENCOUNTER — Encounter: Payer: Self-pay | Admitting: Internal Medicine

## 2020-04-14 LAB — CBC WITH DIFFERENTIAL/PLATELET
Abs Immature Granulocytes: 0.02 10*3/uL (ref 0.00–0.07)
Basophils Absolute: 0 10*3/uL (ref 0.0–0.1)
Basophils Relative: 0 %
Eosinophils Absolute: 0 10*3/uL (ref 0.0–0.5)
Eosinophils Relative: 0 %
HCT: 30.4 % — ABNORMAL LOW (ref 39.0–52.0)
Hemoglobin: 9.7 g/dL — ABNORMAL LOW (ref 13.0–17.0)
Immature Granulocytes: 0 %
Lymphocytes Relative: 27 %
Lymphs Abs: 1.3 10*3/uL (ref 0.7–4.0)
MCH: 32.8 pg (ref 26.0–34.0)
MCHC: 31.9 g/dL (ref 30.0–36.0)
MCV: 102.7 fL — ABNORMAL HIGH (ref 80.0–100.0)
Monocytes Absolute: 0.1 10*3/uL (ref 0.1–1.0)
Monocytes Relative: 2 %
Neutro Abs: 3.4 10*3/uL (ref 1.7–7.7)
Neutrophils Relative %: 71 %
Platelets: 157 10*3/uL (ref 150–400)
RBC: 2.96 MIL/uL — ABNORMAL LOW (ref 4.22–5.81)
RDW: 14.9 % (ref 11.5–15.5)
WBC: 4.8 10*3/uL (ref 4.0–10.5)
nRBC: 0 % (ref 0.0–0.2)

## 2020-04-14 LAB — PARATHYROID HORMONE, INTACT (NO CA): PTH: 132 pg/mL — ABNORMAL HIGH (ref 15–65)

## 2020-04-14 LAB — COMPREHENSIVE METABOLIC PANEL
ALT: 15 U/L (ref 0–44)
AST: 30 U/L (ref 15–41)
Albumin: 3.6 g/dL (ref 3.5–5.0)
Alkaline Phosphatase: 81 U/L (ref 38–126)
Anion gap: 15 (ref 5–15)
BUN: 64 mg/dL — ABNORMAL HIGH (ref 8–23)
CO2: 17 mmol/L — ABNORMAL LOW (ref 22–32)
Calcium: 9.4 mg/dL (ref 8.9–10.3)
Chloride: 106 mmol/L (ref 98–111)
Creatinine, Ser: 10.43 mg/dL — ABNORMAL HIGH (ref 0.61–1.24)
GFR, Estimated: 4 mL/min — ABNORMAL LOW (ref >60)
Glucose, Bld: 239 mg/dL — ABNORMAL HIGH (ref 70–99)
Potassium: 5.5 mmol/L — ABNORMAL HIGH (ref 3.5–5.1)
Sodium: 138 mmol/L (ref 135–145)
Total Bilirubin: 0.8 mg/dL (ref 0.3–1.2)
Total Protein: 7.3 g/dL (ref 6.5–8.1)

## 2020-04-14 LAB — GLUCOSE, CAPILLARY
Glucose-Capillary: 182 mg/dL — ABNORMAL HIGH (ref 70–99)
Glucose-Capillary: 176 mg/dL — ABNORMAL HIGH (ref 70–99)

## 2020-04-14 LAB — C-REACTIVE PROTEIN: CRP: 0.7 mg/dL (ref <1.0)

## 2020-04-14 LAB — FERRITIN: Ferritin: 1130 ng/mL — ABNORMAL HIGH (ref 24–336)

## 2020-04-14 LAB — CBG MONITORING, ED
Glucose-Capillary: 214 mg/dL — ABNORMAL HIGH (ref 70–99)
Glucose-Capillary: 290 mg/dL — ABNORMAL HIGH (ref 70–99)

## 2020-04-14 LAB — FIBRIN DERIVATIVES D-DIMER (ARMC ONLY): Fibrin derivatives D-dimer (ARMC): 2071.23 ng/mL (FEU) — ABNORMAL HIGH (ref 0.00–499.00)

## 2020-04-14 MED ORDER — PNEUMOCOCCAL VAC POLYVALENT 25 MCG/0.5ML IJ INJ
0.5000 mL | INJECTION | INTRAMUSCULAR | Status: AC
  Administered 2020-04-17: 0.5 mL via INTRAMUSCULAR
  Filled 2020-04-14: qty 0.5

## 2020-04-14 MED ORDER — PREDNISONE 50 MG PO TABS
60.0000 mg | ORAL_TABLET | Freq: Every day | ORAL | Status: DC
  Administered 2020-04-15 – 2020-04-16 (×2): 60 mg via ORAL
  Filled 2020-04-14 (×2): qty 1

## 2020-04-14 MED ORDER — PREDNISONE 50 MG PO TABS
60.0000 mg | ORAL_TABLET | Freq: Once | ORAL | Status: AC
  Administered 2020-04-14: 60 mg via ORAL
  Filled 2020-04-14: qty 1

## 2020-04-14 MED ORDER — INFLUENZA VAC A&B SA ADJ QUAD 0.5 ML IM PRSY
0.5000 mL | PREFILLED_SYRINGE | INTRAMUSCULAR | Status: AC
  Administered 2020-04-17: 0.5 mL via INTRAMUSCULAR
  Filled 2020-04-14: qty 0.5

## 2020-04-14 NOTE — ED Notes (Signed)
Pt asleep at this time. This Rn attempting to waking pt up. Pt uncooperative at this time. Pt oxygen at 97% RA. HR 66; RR 18. Breathing even and unlabored.  Pt resting at this time.

## 2020-04-14 NOTE — Progress Notes (Signed)
PROGRESS NOTE    [name]  XKG:[id number] DOB: 1930/03/15 DOA: 04/12/2020 PCP: Casilda Carls, MD    Chief Complaint  Patient presents with  . Weakness  . Fatigue    Brief Narrative:  Alejandro Armstrong  is a 86 y.o. male with a known history of end-stage renal disease has not had dialysis since Friday.  He has not been feeling well.  He has been feeling tired and fatigued. His wife recently had Covid.  He has not felt well enough to go to dialysis on Monday or Wednesday.  he was  Tested Covid positive on 10/27.  [name].  Slight shortness of breath.  In the ER, he was found to be hyperkalemic with a potassium of 6.8.  Hospitalist services were contacted for further evaluation  Subjective:  He is very hard of hearing, he denies new complaints  Assessment & Plan:   Active Problems:   Hyperkalemia   ESRD (end stage renal disease) on dialysis [facility])   Essential hypertension   Hypothyroidism   COVID-19 pneumonia -Chest x-ray personally reviewed showed left lateral lung base infiltrate -CRP 0.8 -Currently on 2 L oxygen -Continue remdesivir, change Decadron to Solu-Medrol, continue zinc and vitamin C -Monitor daily CRP and oxygen requirement   Hyperkalemia due to missed dialysis, receivedcalcium, insulin and D50, sodium bicarb, Lokelma and   HD on 10/28 night k 5.5 remain elevated Plan getting another dialysis today  ESRD on HD MWF, anemia of chronic disease Plan per nephrology  Diet controlled diabetes With hyperglycemia, expect worsening of hyperglycemia with steroid use Start meal coverage and SSI A1c in process  DVT prophylaxis: heparin injection 5,000 Units Start: 04/12/20 1630   Code Status:DNR Family Communication: not able to reach wife, called sister Disposition:   Status is: Inpatient  Dispo: The patient is from: home              Anticipated d/c is to: TBD              Anticipated d/c date is: TBD                 Consultants:   nephrology  Procedures:   dialysis  Antimicrobials:        Objective: Vitals:   04/14/20 0330 04/14/20 0400 04/14/20 0500 04/14/20 0600  BP: (!) 114/57 (!) 141/59    Pulse: (!) 53 (!) 54 60 (!) 52  Resp: 19 15 15 18   Temp:      TempSrc:      SpO2: 93% 96% 98% 95%  Weight:      Height:       No intake or output data in the 24 hours ending 04/14/20 0913 Filed Weights   04/12/20 1441  Weight: 72.6 kg    Examination:  General exam: calm, NAD, hard of hearing Respiratory system: diminished , no wheezing, Respiratory effort normal. Cardiovascular system: S1 & S2 heard, RRR. No JVD, no murmur, No pedal edema. Gastrointestinal system: Abdomen is nondistended, soft and nontender. Normal bowel sounds heard. Central nervous system: Alert and oriented, but very hard of hearing. No focal neurological deficits. Extremities: Symmetric 5 x 5 power. Skin: No rashes, lesions or ulcers Psychiatry: Appear anxious    Data Reviewed: I have personally reviewed following labs and imaging studies  CBC: Recent Labs  Lab 04/12/20 1449 04/13/20 0646 04/14/20 0432  WBC 6.2 2.8* 4.8  NEUTROABS  --  2.2 3.4  HGB 11.2* 9.7* 9.7*  HCT 33.9* 29.0* 30.4*  MCV 99.4 98.3 102.7*  PLT 175 130* 409    Basic Metabolic Panel: Recent Labs  Lab 04/12/20 1449 04/12/20 1740 04/13/20 0646 04/14/20 0432  NA 140  --  139 138  K 6.8*  --  5.6* 5.5*  CL 110  --  104 106  CO2 17*  --  20* 17*  GLUCOSE 198*  --  283* 239*  BUN 68*  --  54* 64*  CREATININE 12.18*  --  9.75* 10.43*  CALCIUM 9.6  --  9.3 9.4  PHOS  --  4.1  --   --     GFR: Estimated Creatinine Clearance: 4.7 mL/min (A) (by C-G formula based on SCr of 10.43 mg/dL (H)).  Liver Function Tests: Recent Labs  Lab 04/13/20 0646 04/14/20 0432  AST 21 30  ALT 13 15  ALKPHOS 84 81  BILITOT 0.9 0.8  PROT 7.5 7.3  ALBUMIN 3.6 3.6    CBG: Recent Labs  Lab 04/12/20 1810 04/13/20 1521 04/14/20 0434    GLUCAP 133* 193* 214*     Recent Results (from the past 240 hour(s))  Respiratory Panel by RT PCR (Flu A&B, Covid) - Nasopharyngeal Swab     Status: Abnormal   Collection Time: 04/12/20  3:56 PM   Specimen: Nasopharyngeal Swab  Result Value Ref Range Status   SARS Coronavirus 2 by RT PCR POSITIVE (A) NEGATIVE Final    Comment: RESULT CALLED TO, READ BACK BY AND VERIFIED WITH: [facility] REGISTER AT 8119 04/12/2020 BY MOSLEY,J (NOTE) SARS-CoV-2 target nucleic acids are DETECTED.  SARS-CoV-2 RNA is generally detectable in upper respiratory specimens  during the acute phase of infection. Positive results are indicative of the presence of the identified virus, but do not rule out bacterial infection or co-infection with other pathogens not detected by the test. Clinical correlation with patient history and other diagnostic information is necessary to determine patient infection status. The expected result is Negative.  Fact Sheet for Patients:  PinkCheek.be  Fact Sheet for Healthcare Providers: GravelBags.it  This test is not yet approved or cleared by the [place] FDA and  has been authorized for detection and/or diagnosis of SARS-CoV-2 by FDA under an Emergency Use Authorization (EUA).  This EUA will remain in effect (meaning this te st can be used) for the duration of  the COVID-19 declaration under Section 564(b)(1) of the Act, 21 U.S.C. section 360bbb-3(b)(1), unless the authorization is terminated or revoked sooner.      Influenza A by PCR NEGATIVE NEGATIVE Final   Influenza B by PCR NEGATIVE NEGATIVE Final    Comment: (NOTE) The Xpert Xpress SARS-CoV-2/FLU/RSV assay is intended as an aid in  the diagnosis of influenza from Nasopharyngeal swab specimens and  should not be used as a sole basis for treatment. Nasal washings and  aspirates are unacceptable for Xpert Xpress SARS-CoV-2/FLU/RSV  testing.  Fact Sheet  for Patients: PinkCheek.be  Fact Sheet for Healthcare Providers: GravelBags.it  This test is not yet approved or cleared by the [place] FDA and  has been authorized for detection and/or diagnosis of SARS-CoV-2 by  FDA under an Emergency Use Authorization (EUA). This EUA will remain  in effect (meaning this test can be used) for the duration of the  Covid-19 declaration under Section 564(b)(1) of the Act, 21  U.S.C. section 360bbb-3(b)(1), unless the authorization is  terminated or revoked. Performed at [facility], [address] [place], [place] [address]  Radiology Studies: DG Chest Portable 1 View  Result Date: 04/12/2020 CLINICAL DATA:  COVID positive, ESRD. EXAM: PORTABLE CHEST 1 VIEW COMPARISON:  Nov 04, 2019 FINDINGS: Stable cardiomediastinal silhouette. Subtle opacities in the left lateral lung base. No visible pleural effusions or pneumothorax. Hiatal hernia. Vascular stents in the left subclavian left axillary regions. IMPRESSION: Subtle opacities in the left lateral lung base, which may represent early pneumonia given reported COVID diagnosis. Electronically Signed   By: Margaretha Sheffield MD   On: 04/12/2020 15:53        Scheduled Meds: . albuterol  2 puff Inhalation Q6H  . amLODipine  5 mg Oral Daily  . vitamin C  500 mg Oral Daily  . aspirin EC  81 mg Oral Daily  . atorvastatin  10 mg Oral Daily  . Chlorhexidine Gluconate Cloth  6 each Topical Q0600  . furosemide  40 mg Oral BID  . heparin  5,000 Units Subcutaneous Q8H  . hydrALAZINE  25 mg Oral BID  . insulin aspart  0-6 Units Subcutaneous TID WC  . insulin aspart  2 Units Subcutaneous TID WC  . levothyroxine  125 mcg Oral QAC breakfast  . loratadine  10 mg Oral Daily  . methylPREDNISolone (SOLU-MEDROL) injection  60 mg Intravenous Q12H  . multivitamin  1 tablet Oral Daily  . omega-3 acid ethyl esters  2 g Oral BID  .  patiromer  16.8 g Oral Daily  . zinc sulfate  220 mg Oral Daily   Continuous Infusions: . sodium chloride    . sodium chloride    . remdesivir 100 mg in NS 100 mL Stopped (04/13/20 1241)     LOS: 2 days   Time spent: 44mins Greater than 50% of this time was spent in counseling, explanation of diagnosis, planning of further management, and coordination of care.  I have personally reviewed and interpreted on  04/14/2020 daily labs,I reviewed all nursing notes, pharmacy notes, consultant notes,  vitals, pertinent old records  I have discussed plan of care as described above with RN , patient on 04/14/2020  Voice Recognition /Dragon dictation system was used to create this note, attempts have been made to correct errors. Please contact the author with questions and/or clarifications.   Florencia Reasons, MD PhD FACP [facility]  Available via Epic secure chat 7am-7pm for nonurgent issues Please page for urgent issues To page the attending provider between 7A-7P or the covering provider during after hours 7P-7A, please log into the web site [email or web address] and access using universal [place] password for that web site. If you do not have the password, please call the hospital operator.    04/14/2020, 9:13 AM

## 2020-04-14 NOTE — ED Notes (Signed)
Pt awake at this time. Pt provided with lunch.

## 2020-04-14 NOTE — ED Notes (Signed)
Pt changed into a gown and linen changed.  Pt speaking in clear sentences. A/O x4. Pt able to stand on his with RN at bedside.

## 2020-04-14 NOTE — ED Notes (Signed)
Pt continuously taking off telemetry monitor cords

## 2020-04-14 NOTE — Progress Notes (Signed)
Another consult was placed to the IV Therapist for a midline;  Pt is limited to Right arm only; CrCl is 4; he is a renal pt;  This Probation officer spent considerable amount of time with the patient earlier today;  Attempted x 2 in the Right arm with ultrasound;  Veins do not compress easily;  Scar tissue is noted; veins infiltrate or catheters do not thread;  Have spoken with the RN again regarding iv access issues. Did not visualize a cephalic vein in the Right upper arm.

## 2020-04-14 NOTE — ED Notes (Signed)
Pt resting in bed with eyes closed, lights dimmed for comfort. Call bell within reach, side rails up x2

## 2020-04-14 NOTE — ED Notes (Addendum)
RN walked into patients room, and he had taken linen out linen cabinet and trash out of trash can and it was thrown all over his room. Patient appeared to be confused and did not know where he was and was attempting to put his clothing on, because he was naked. Patient also pulled out his IV and taken all of his linen off his bed, said it was wet.  RN cleaned his room and emptied his linen bag and trash can and attempted to check his glucose, patient refused and he refused RN to put in an IV Patient did not know where he was but he knew he had [place] witness

## 2020-04-14 NOTE — ED Notes (Signed)
This rn spoke to dialysis RN- Cristy who st to give medication to pt due to delay on dialysis treatment. This Rn will administer medications held at this time.

## 2020-04-14 NOTE — Progress Notes (Signed)
Dialysis Nurse arrived at 1800 to complete treatment.  Found NO bruit and NO Thrill in access arm.   There is a pulse at the base of the graft, but past that this nurse felt nothing and heard nothing.  Floor Nurse,  Payton Mccallum, RN was Informed and also assessed with No sound, Buit or Thrill Dr. Juleen China Notified.  Jeanett Schlein, RN Dialysis Dept

## 2020-04-14 NOTE — ED Notes (Signed)
Pt resting comfortably in bed, denies needs or concerns at this time. Belongings collected and placed in white pt belonging bag and remains at bedside

## 2020-04-14 NOTE — ED Notes (Signed)
Pt resting in bed, removed himself completely from monitor, RN placed pt back on monitor and reminded him to keep BP and O2 cords in place. Pt reminded how to use the call bell for needs. Labs obtained. Pt alert but lethargic

## 2020-04-14 NOTE — ED Notes (Signed)
IV team stating they will not be able to do midline on patient. States they attempted x2 with ultrasound without success and cannot attempt more and states that they normally cannot do midlines on nephrology patients. MD Erlinda Hong paged to make aware.

## 2020-04-14 NOTE — ED Notes (Signed)
Blankets provided to pt. Pt resting comfortably at this time.

## 2020-04-14 NOTE — Progress Notes (Signed)
[facility]  ROUNDING NOTE   Subjective:   Diagnosed with COVID Dialysis for later today.   Objective:  Vital signs in last 24 hours:  Temp:  [98.8 F (37.1 C)] 98.8 F (37.1 C) (10/30 1150) Pulse Rate:  [52-72] 71 (10/30 1150) Resp:  [14-20] 19 (10/30 1150) BP: (114-157)/(54-82) 153/77 (10/30 1150) SpO2:  [93 %-100 %] 100 % (10/30 1150)  Weight change:  Filed Weights   04/12/20 1441  Weight: 72.6 kg    Intake/Output: I/O last 3 completed shifts: In: -  Out: 1000 [Other:1000]   Intake/Output this shift:  No intake/output data recorded.  Physical Exam: General: No acute distress  Head: Normocephalic, atraumatic. Moist oral mucosal membranes  Eyes: Anicteric  Lungs:  Clear to auscultation,diminished at the bases  Heart: Regular rate and rhythm  Abdomen:  Soft, nontender,   Extremities:  No  peripheral edema.  Neurologic: Nonfocal, moving all four extremities,Hard of hearing  Skin: No lesions or rashes noted  Access: Lt Upper Arm AVF    Basic Metabolic Panel: Recent Labs  Lab 04/12/20 1449 04/12/20 1740 04/13/20 0646 04/14/20 0432  NA 140  --  139 138  K 6.8*  --  5.6* 5.5*  CL 110  --  104 106  CO2 17*  --  20* 17*  GLUCOSE 198*  --  283* 239*  BUN 68*  --  54* 64*  CREATININE 12.18*  --  9.75* 10.43*  CALCIUM 9.6  --  9.3 9.4  PHOS  --  4.1  --   --     Liver Function Tests: Recent Labs  Lab 04/13/20 0646 04/14/20 0432  AST 21 30  ALT 13 15  ALKPHOS 84 81  BILITOT 0.9 0.8  PROT 7.5 7.3  ALBUMIN 3.6 3.6   No results for input(s): LIPASE, AMYLASE in the last 168 hours. No results for input(s): AMMONIA in the last 168 hours.  CBC: Recent Labs  Lab 04/12/20 1449 04/13/20 0646 04/14/20 0432  WBC 6.2 2.8* 4.8  NEUTROABS  --  2.2 3.4  HGB 11.2* 9.7* 9.7*  HCT 33.9* 29.0* 30.4*  MCV 99.4 98.3 102.7*  PLT 175 130* 157    Cardiac Enzymes: No results for input(s): CKTOTAL, CKMB, CKMBINDEX, TROPONINI in the last 168  hours.  BNP: Invalid input(s): POCBNP  CBG: Recent Labs  Lab 04/12/20 1810 04/13/20 1521 04/14/20 0434  GLUCAP 133* 193* 214*    Microbiology: Results for orders placed or performed during the hospital encounter of 04/12/20  Respiratory Panel by RT PCR (Flu A&B, Covid) - Nasopharyngeal Swab     Status: Abnormal   Collection Time: 04/12/20  3:56 PM   Specimen: Nasopharyngeal Swab  Result Value Ref Range Status   SARS Coronavirus 2 by RT PCR POSITIVE (A) NEGATIVE Final    Comment: RESULT CALLED TO, READ BACK BY AND VERIFIED WITH: [facility] REGISTER AT 6073 04/12/2020 BY MOSLEY,J (NOTE) SARS-CoV-2 target nucleic acids are DETECTED.  SARS-CoV-2 RNA is generally detectable in upper respiratory specimens  during the acute phase of infection. Positive results are indicative of the presence of the identified virus, but do not rule out bacterial infection or co-infection with other pathogens not detected by the test. Clinical correlation with patient history and other diagnostic information is necessary to determine patient infection status. The expected result is Negative.  Fact Sheet for Patients:  PinkCheek.be  Fact Sheet for Healthcare Providers: GravelBags.it  This test is not yet approved or cleared by the [place]  FDA and  has been authorized for detection and/or diagnosis of SARS-CoV-2 by FDA under an Emergency Use Authorization (EUA).  This EUA will remain in effect (meaning this te st can be used) for the duration of  the COVID-19 declaration under Section 564(b)(1) of the Act, 21 U.S.C. section 360bbb-3(b)(1), unless the authorization is terminated or revoked sooner.      Influenza A by PCR NEGATIVE NEGATIVE Final   Influenza B by PCR NEGATIVE NEGATIVE Final    Comment: (NOTE) The Xpert Xpress SARS-CoV-2/FLU/RSV assay is intended as an aid in  the diagnosis of influenza from Nasopharyngeal swab specimens  and  should not be used as a sole basis for treatment. Nasal washings and  aspirates are unacceptable for Xpert Xpress SARS-CoV-2/FLU/RSV  testing.  Fact Sheet for Patients: PinkCheek.be  Fact Sheet for Healthcare Providers: GravelBags.it  This test is not yet approved or cleared by the [place] FDA and  has been authorized for detection and/or diagnosis of SARS-CoV-2 by  FDA under an Emergency Use Authorization (EUA). This EUA will remain  in effect (meaning this test can be used) for the duration of the  Covid-19 declaration under Section 564(b)(1) of the Act, 21  U.S.C. section 360bbb-3(b)(1), unless the authorization is  terminated or revoked. Performed at [facility], [address] [place], [place] [address]     Coagulation Studies: No results for input(s): LABPROT, INR in the last 72 hours.  Urinalysis: Recent Labs    04/12/20 1740  COLORURINE YELLOW*  LABSPEC 1.009  PHURINE 8.0  GLUCOSEU >=500*  HGBUR SMALL*  BILIRUBINUR NEGATIVE  KETONESUR NEGATIVE  PROTEINUR 100*  NITRITE NEGATIVE  LEUKOCYTESUR MODERATE*      Imaging: DG Chest Portable 1 View  Result Date: 04/12/2020 CLINICAL DATA:  COVID positive, ESRD. EXAM: PORTABLE CHEST 1 VIEW COMPARISON:  Nov 04, 2019 FINDINGS: Stable cardiomediastinal silhouette. Subtle opacities in the left lateral lung base. No visible pleural effusions or pneumothorax. Hiatal hernia. Vascular stents in the left subclavian left axillary regions. IMPRESSION: Subtle opacities in the left lateral lung base, which may represent early pneumonia given reported COVID diagnosis. Electronically Signed   By: Margaretha Sheffield MD   On: 04/12/2020 15:53     Medications:   . sodium chloride    . sodium chloride    . remdesivir 100 mg in NS 100 mL Stopped (04/13/20 1241)   . albuterol  2 puff Inhalation Q6H  . amLODipine  5 mg Oral Daily  . vitamin C  500 mg Oral  Daily  . aspirin EC  81 mg Oral Daily  . atorvastatin  10 mg Oral Daily  . Chlorhexidine Gluconate Cloth  6 each Topical Q0600  . furosemide  40 mg Oral BID  . heparin  5,000 Units Subcutaneous Q8H  . hydrALAZINE  25 mg Oral BID  . insulin aspart  0-6 Units Subcutaneous TID WC  . insulin aspart  2 Units Subcutaneous TID WC  . levothyroxine  125 mcg Oral QAC breakfast  . loratadine  10 mg Oral Daily  . methylPREDNISolone (SOLU-MEDROL) injection  60 mg Intravenous Q12H  . multivitamin  1 tablet Oral Daily  . omega-3 acid ethyl esters  2 g Oral BID  . patiromer  16.8 g Oral Daily  . zinc sulfate  220 mg Oral Daily   sodium chloride, sodium chloride, acetaminophen, alteplase, guaiFENesin-dextromethorphan, heparin, lidocaine (PF), lidocaine-prilocaine, lidocaine-prilocaine, ondansetron **OR** ondansetron (ZOFRAN) IV, pentafluoroprop-tetrafluoroeth, polyvinyl alcohol  Assessment/ Plan:  Mr. Alejandro Armstrong  is a 85 y.o.  male known to our practice, has a h/o ESRD on dialysis MWF.He didn't get dialyzed on Monday or Wednesday this week as he was not feeling well to go to his outpatient dialysis center.He was diagnosed as COVID positive.Patient was hyperkalemic in the ED and received urgent dialysis last night.  #ESRD on dialysis MWF with hyperkalemia - Dialysis for today, Then resume MWF schedule.  - patiromer  # Anemia of Chronic Kidney Disease Hemoglobin 9.7 ,at goal No indication for Epogen  #Secondary Hyperparathyroidism  No indication for Phos binders   #Hypertension:  - amlodipine, furosemide and hydralazine   LOS: 2 Shahin Knierim 10/30/202112:47 PM

## 2020-04-14 NOTE — ED Notes (Signed)
RN checked on patient and he is in bed eating breakfast, patient appears to be more oriented and has his hearing aides in. He is asking to speak to his wife

## 2020-04-14 NOTE — ED Notes (Signed)
This Rn took over pts care. This confirmed with dialysis to make sure pt is on the list, which was confirmed by dialysis RN.

## 2020-04-14 NOTE — ED Notes (Signed)
This Rn attempted to establish PIV 2x wi success. IV team order in placed at this time.

## 2020-04-14 NOTE — ED Notes (Addendum)
IV team at bedside. UA to establish PIV. MD Florencia Reasons made aware.

## 2020-04-15 DIAGNOSIS — E119 Type 2 diabetes mellitus without complications: Secondary | ICD-10-CM

## 2020-04-15 LAB — CBC WITH DIFFERENTIAL/PLATELET
Abs Immature Granulocytes: 0.06 10*3/uL (ref 0.00–0.07)
Basophils Absolute: 0 10*3/uL (ref 0.0–0.1)
Basophils Relative: 0 %
Eosinophils Absolute: 0 10*3/uL (ref 0.0–0.5)
Eosinophils Relative: 0 %
HCT: 26.2 % — ABNORMAL LOW (ref 39.0–52.0)
Hemoglobin: 8.9 g/dL — ABNORMAL LOW (ref 13.0–17.0)
Immature Granulocytes: 1 %
Lymphocytes Relative: 15 %
Lymphs Abs: 0.9 10*3/uL (ref 0.7–4.0)
MCH: 33.2 pg (ref 26.0–34.0)
MCHC: 34 g/dL (ref 30.0–36.0)
MCV: 97.8 fL (ref 80.0–100.0)
Monocytes Absolute: 0.3 10*3/uL (ref 0.1–1.0)
Monocytes Relative: 6 %
Neutro Abs: 4.5 10*3/uL (ref 1.7–7.7)
Neutrophils Relative %: 78 %
Platelets: 166 10*3/uL (ref 150–400)
RBC: 2.68 MIL/uL — ABNORMAL LOW (ref 4.22–5.81)
RDW: 14.9 % (ref 11.5–15.5)
WBC: 5.7 10*3/uL (ref 4.0–10.5)
nRBC: 0 % (ref 0.0–0.2)

## 2020-04-15 LAB — PHOSPHORUS: Phosphorus: 6.8 mg/dL — ABNORMAL HIGH (ref 2.5–4.6)

## 2020-04-15 LAB — COMPREHENSIVE METABOLIC PANEL
ALT: 16 U/L (ref 0–44)
AST: 28 U/L (ref 15–41)
Albumin: 3.3 g/dL — ABNORMAL LOW (ref 3.5–5.0)
Alkaline Phosphatase: 69 U/L (ref 38–126)
Anion gap: 14 (ref 5–15)
BUN: 80 mg/dL — ABNORMAL HIGH (ref 8–23)
CO2: 17 mmol/L — ABNORMAL LOW (ref 22–32)
Calcium: 9 mg/dL (ref 8.9–10.3)
Chloride: 103 mmol/L (ref 98–111)
Creatinine, Ser: 10.49 mg/dL — ABNORMAL HIGH (ref 0.61–1.24)
GFR, Estimated: 4 mL/min — ABNORMAL LOW (ref >60)
Glucose, Bld: 202 mg/dL — ABNORMAL HIGH (ref 70–99)
Potassium: 5.6 mmol/L — ABNORMAL HIGH (ref 3.5–5.1)
Sodium: 134 mmol/L — ABNORMAL LOW (ref 135–145)
Total Bilirubin: 0.7 mg/dL (ref 0.3–1.2)
Total Protein: 6.5 g/dL (ref 6.5–8.1)

## 2020-04-15 LAB — FIBRIN DERIVATIVES D-DIMER (ARMC ONLY): Fibrin derivatives D-dimer (ARMC): 1513.38 ng/mL (FEU) — ABNORMAL HIGH (ref 0.00–499.00)

## 2020-04-15 LAB — GLUCOSE, CAPILLARY
Glucose-Capillary: 282 mg/dL — ABNORMAL HIGH (ref 70–99)
Glucose-Capillary: 334 mg/dL — ABNORMAL HIGH (ref 70–99)
Glucose-Capillary: 224 mg/dL — ABNORMAL HIGH (ref 70–99)
Glucose-Capillary: 248 mg/dL — ABNORMAL HIGH (ref 70–99)

## 2020-04-15 LAB — C-REACTIVE PROTEIN: CRP: 0.6 mg/dL (ref <1.0)

## 2020-04-15 LAB — FERRITIN: Ferritin: 982 ng/mL — ABNORMAL HIGH (ref 24–336)

## 2020-04-15 MED ORDER — PATIROMER SORBITEX CALCIUM 8.4 G PO PACK
25.2000 g | PACK | Freq: Every day | ORAL | Status: DC
  Administered 2020-04-17 – 2020-04-18 (×2): 25.2 g via ORAL
  Filled 2020-04-15 (×3): qty 3

## 2020-04-15 MED ORDER — HEPARIN SODIUM (PORCINE) 5000 UNIT/ML IJ SOLN: 5000.0000 [IU] | Freq: Three times a day (TID) | INTRAMUSCULAR | Status: DC

## 2020-04-15 MED ORDER — HEPARIN SODIUM (PORCINE) 5000 UNIT/ML IJ SOLN
5000.0000 [IU] | Freq: Three times a day (TID) | INTRAMUSCULAR | Status: AC
  Administered 2020-04-15 (×2): 5000 [IU] via SUBCUTANEOUS
  Filled 2020-04-15 (×2): qty 1

## 2020-04-15 NOTE — Progress Notes (Signed)
PROGRESS NOTE    [name]  WCH:[id number] DOB: 24-Oct-1929 DOA: 04/12/2020 PCP: Casilda Carls, MD    Chief Complaint  Patient presents with  . Weakness  . Fatigue    Brief Narrative:  Alejandro Armstrong  is a 86 y.o. male with a known history of end-stage renal disease has not had dialysis since Friday.  He has not been feeling well.  He has been feeling tired and fatigued. His wife recently had Covid.  He has not felt well enough to go to dialysis on Monday or Wednesday.  he was  Tested Covid positive on 10/27.  [name].  Slight shortness of breath.  In the ER, he was found to be hyperkalemic with a potassium of 6.8.  Hospitalist services were contacted for further evaluation  Subjective:  He is very hard of hearing, he denies new complaints, he denies sob  Assessment & Plan:   Active Problems:   Hyperkalemia   ESRD (end stage renal disease) on dialysis [facility])   Essential hypertension   Hypothyroidism   COVID-19 pneumonia -Chest x-ray personally reviewed showed left lateral lung base infiltrate --was on 2 L oxygen, today he is on room air -he received remdesivirx3 doses , initially Decadron then  Solu-Medrol, -crp and ferrintin trending down, he is now on room air, no hypoxia, he lost iv access, not able to get the last two doses of remdesivir, change solu-medrol to prednisone,  continue zinc and vitamin C   ESRD on HD MWF, anemia of chronic disease Hyperkalemia due to missed dialysis, receivedcalcium, insulin and D50, sodium bicarb, Lokelma and   HD on 10/28 night Have dialysis access issue, he is n.p.o. after midnight vascular surgery plan to "declot vs permacath and new access" Plan per nephrology  Diet controlled diabetes With hyperglycemia, expect worsening of hyperglycemia with steroid use Start meal coverage and SSI A1c in process  DVT prophylaxis: heparin injection 5,000 Units Start: 04/16/20 1400   Code  Status:DNR Family Communication: wife over the phone on 10/31 Disposition:   Status is: Inpatient  Dispo: The patient is from: home              Anticipated d/c is to: TBD              Anticipated d/c date is: TBD                Consultants:   Nephrology  Vascular surgery  Procedures:   dialysis  Antimicrobials:        Objective: Vitals:   04/14/20 2012 04/15/20 0508 04/15/20 0833 04/15/20 1120  BP: (!) 158/82 (!) 147/73 (!) 154/70 110/83  Pulse: 90 (!) 58 64 61  Resp: 20  18 18   Temp: 97.6 F (36.4 C) 98 F (36.7 C) 97.6 F (36.4 C) 97.6 F (36.4 C)  TempSrc: Oral Oral Oral Oral  SpO2: 100%  100% 97%  Weight:      Height:        Intake/Output Summary (Last 24 hours) at 04/15/2020 1325 Last data filed at 04/15/2020 1157 Gross per 24 hour  Intake 96.64 ml  Output 600 ml  Net -503.36 ml   Filed Weights   04/12/20 1441  Weight: 72.6 kg    Examination:  General exam: calm, NAD, hard of hearing Respiratory system: diminished , no wheezing, Respiratory effort normal. Cardiovascular system: S1 & S2 heard, RRR. No JVD, no murmur, No pedal edema. Gastrointestinal system: Abdomen is nondistended, soft  and nontender. Normal bowel sounds heard. Central nervous system: Alert and oriented, but very hard of hearing. No focal neurological deficits. Extremities: Symmetric 5 x 5 power. Skin: No rashes, lesions or ulcers Psychiatry: Appear anxious    Data Reviewed: I have personally reviewed following labs and imaging studies  CBC: Recent Labs  Lab 04/12/20 1449 04/13/20 0646 04/14/20 0432 04/14/20 2356  WBC 6.2 2.8* 4.8 5.7  NEUTROABS  --  2.2 3.4 4.5  HGB 11.2* 9.7* 9.7* 8.9*  HCT 33.9* 29.0* 30.4* 26.2*  MCV 99.4 98.3 102.7* 97.8  PLT 175 130* 157 761    Basic Metabolic Panel: Recent Labs  Lab 04/12/20 1449 04/12/20 1740 04/13/20 0646 04/14/20 0432 04/14/20 2356  NA 140  --  139 138 134*  K 6.8*  --  5.6* 5.5* 5.6*  CL 110  --  104 106  103  CO2 17*  --  20* 17* 17*  GLUCOSE 198*  --  283* 239* 202*  BUN 68*  --  54* 64* 80*  CREATININE 12.18*  --  9.75* 10.43* 10.49*  CALCIUM 9.6  --  9.3 9.4 9.0  PHOS  --  4.1  --   --  6.8*    GFR: Estimated Creatinine Clearance: 4.6 mL/min (A) (by C-G formula based on SCr of 10.49 mg/dL (H)).  Liver Function Tests: Recent Labs  Lab 04/13/20 0646 04/14/20 0432 04/14/20 2356  AST 21 30 28   ALT 13 15 16   ALKPHOS 84 81 69  BILITOT 0.9 0.8 0.7  PROT 7.5 7.3 6.5  ALBUMIN 3.6 3.6 3.3*    CBG: Recent Labs  Lab 04/14/20 1331 04/14/20 1732 04/14/20 2015 04/15/20 0830 04/15/20 1118  GLUCAP 290* 182* 176* 282* 334*     Recent Results (from the past 240 hour(s))  Respiratory Panel by RT PCR (Flu A&B, Covid) - Nasopharyngeal Swab     Status: Abnormal   Collection Time: 04/12/20  3:56 PM   Specimen: Nasopharyngeal Swab  Result Value Ref Range Status   SARS Coronavirus 2 by RT PCR POSITIVE (A) NEGATIVE Final    Comment: RESULT CALLED TO, READ BACK BY AND VERIFIED WITH: [facility] REGISTER AT 6073 04/12/2020 BY MOSLEY,J (NOTE) SARS-CoV-2 target nucleic acids are DETECTED.  SARS-CoV-2 RNA is generally detectable in upper respiratory specimens  during the acute phase of infection. Positive results are indicative of the presence of the identified virus, but do not rule out bacterial infection or co-infection with other pathogens not detected by the test. Clinical correlation with patient history and other diagnostic information is necessary to determine patient infection status. The expected result is Negative.  Fact Sheet for Patients:  PinkCheek.be  Fact Sheet for Healthcare Providers: GravelBags.it  This test is not yet approved or cleared by the [place] FDA and  has been authorized for detection and/or diagnosis of SARS-CoV-2 by FDA under an Emergency Use Authorization (EUA).  This EUA will remain in  effect (meaning this te st can be used) for the duration of  the COVID-19 declaration under Section 564(b)(1) of the Act, 21 U.S.C. section 360bbb-3(b)(1), unless the authorization is terminated or revoked sooner.      Influenza A by PCR NEGATIVE NEGATIVE Final   Influenza B by PCR NEGATIVE NEGATIVE Final    Comment: (NOTE) The Xpert Xpress SARS-CoV-2/FLU/RSV assay is intended as an aid in  the diagnosis of influenza from Nasopharyngeal swab specimens and  should not be used as a sole basis for treatment. Nasal washings and  aspirates are unacceptable for Xpert Xpress SARS-CoV-2/FLU/RSV  testing.  Fact Sheet for Patients: PinkCheek.be  Fact Sheet for Healthcare Providers: GravelBags.it  This test is not yet approved or cleared by the [place] FDA and  has been authorized for detection and/or diagnosis of SARS-CoV-2 by  FDA under an Emergency Use Authorization (EUA). This EUA will remain  in effect (meaning this test can be used) for the duration of the  Covid-19 declaration under Section 564(b)(1) of the Act, 21  U.S.C. section 360bbb-3(b)(1), unless the authorization is  terminated or revoked. Performed at [facility], [address], [place], [place] [address]          Radiology Studies: No results found.      Scheduled Meds: . albuterol  2 puff Inhalation Q6H  . amLODipine  5 mg Oral Daily  . vitamin C  500 mg Oral Daily  . aspirin EC  81 mg Oral Daily  . atorvastatin  10 mg Oral Daily  . Chlorhexidine Gluconate Cloth  6 each Topical Q0600  . furosemide  40 mg Oral BID  . [START ON 04/16/2020] heparin  5,000 Units Subcutaneous Q8H  . hydrALAZINE  25 mg Oral BID  . influenza vaccine adjuvanted  0.5 mL Intramuscular Tomorrow-1000  . insulin aspart  0-6 Units Subcutaneous TID WC  . insulin aspart  2 Units Subcutaneous TID WC  . levothyroxine  125 mcg Oral QAC breakfast  . loratadine  10  mg Oral Daily  . multivitamin  1 tablet Oral Daily  . omega-3 acid ethyl esters  2 g Oral BID  . patiromer  16.8 g Oral Daily  . pneumococcal 23 valent vaccine  0.5 mL Intramuscular Tomorrow-1000  . predniSONE  60 mg Oral Q breakfast  . zinc sulfate  220 mg Oral Daily   Continuous Infusions: . sodium chloride    . sodium chloride    . remdesivir 100 mg in NS 100 mL Stopped (04/13/20 1241)     LOS: 3 days   Time spent: 9mins Greater than 50% of this time was spent in counseling, explanation of diagnosis, planning of further management, and coordination of care.  I have personally reviewed and interpreted on  04/15/2020 daily labs,I reviewed all nursing notes, pharmacy notes, consultant notes,  vitals, pertinent old records  I have discussed plan of care as described above with RN , patient on 04/15/2020  Voice Recognition /Dragon dictation system was used to create this note, attempts have been made to correct errors. Please contact the author with questions and/or clarifications.   Florencia Reasons, MD PhD FACP [facility]  Available via Epic secure chat 7am-7pm for nonurgent issues Please page for urgent issues To page the attending provider between 7A-7P or the covering provider during after hours 7P-7A, please log into the web site [email or web address] and access using universal  password for that web site. If you do not have the password, please call the hospital operator.    04/15/2020, 1:25 PM

## 2020-04-15 NOTE — Plan of Care (Signed)
  Problem: Pain Managment: Goal: General experience of comfort will improve Outcome: Progressing   Problem: Safety: Goal: Ability to remain free from injury will improve Outcome: Progressing   Problem: Skin Integrity: Goal: Risk for impaired skin integrity will decrease Outcome: Progressing   Problem: Coping: Goal: Psychosocial and spiritual needs will be supported Outcome: Progressing   Problem: Respiratory: Goal: Will maintain a patent airway Outcome: Progressing

## 2020-04-15 NOTE — Progress Notes (Signed)
[facility]  ROUNDING NOTE   Subjective:   Unable to get dialysis yesterday due to clotted access.  Patient states he is doing well and has no complaints.   Objective:  Vital signs in last 24 hours:  Temp:  [97.5 F (36.4 C)-98 F (36.7 C)] 97.6 F (36.4 C) (10/31 1120) Pulse Rate:  [58-98] 61 (10/31 1120) Resp:  [18-20] 18 (10/31 1120) BP: (110-158)/(70-93) 110/83 (10/31 1120) SpO2:  [96 %-100 %] 97 % (10/31 1120)  Weight change:  Filed Weights   04/12/20 1441  Weight: 72.6 kg    Intake/Output: I/O last 3 completed shifts: In: 96.6 [IV Piggyback:96.6] Out: 300 [Urine:300]   Intake/Output this shift:  Total I/O In: -  Out: 300 [Urine:300]  Physical Exam: General: No acute distress  Head: Normocephalic, atraumatic. Moist oral mucosal membranes  Eyes: Anicteric  Lungs:  Clear to auscultation,diminished at the bases  Heart: Regular rate and rhythm  Abdomen:  Soft, nontender,   Extremities:  No  peripheral edema.  Neurologic: Nonfocal, moving all four extremities,Hard of hearing  Skin: No lesions or rashes noted  Access: Lt Upper Arm AVF no bruit or thrill    Basic Metabolic Panel: Recent Labs  Lab 04/12/20 1449 04/12/20 1449 04/12/20 1740 04/13/20 0646 04/14/20 0432 04/14/20 2356  NA 140  --   --  139 138 134*  K 6.8*  --   --  5.6* 5.5* 5.6*  CL 110  --   --  104 106 103  CO2 17*  --   --  20* 17* 17*  GLUCOSE 198*  --   --  283* 239* 202*  BUN 68*  --   --  54* 64* 80*  CREATININE 12.18*  --   --  9.75* 10.43* 10.49*  CALCIUM 9.6   < >  --  9.3 9.4 9.0  PHOS  --   --  4.1  --   --  6.8*   < > = values in this interval not displayed.    Liver Function Tests: Recent Labs  Lab 04/13/20 0646 04/14/20 0432 04/14/20 2356  AST 21 30 28   ALT 13 15 16   ALKPHOS 84 81 69  BILITOT 0.9 0.8 0.7  PROT 7.5 7.3 6.5  ALBUMIN 3.6 3.6 3.3*   No results for input(s): LIPASE, AMYLASE in the last 168 hours. No results for input(s): AMMONIA in  the last 168 hours.  CBC: Recent Labs  Lab 04/12/20 1449 04/13/20 0646 04/14/20 0432 04/14/20 2356  WBC 6.2 2.8* 4.8 5.7  NEUTROABS  --  2.2 3.4 4.5  HGB 11.2* 9.7* 9.7* 8.9*  HCT 33.9* 29.0* 30.4* 26.2*  MCV 99.4 98.3 102.7* 97.8  PLT 175 130* 157 166    Cardiac Enzymes: No results for input(s): CKTOTAL, CKMB, CKMBINDEX, TROPONINI in the last 168 hours.  BNP: Invalid input(s): POCBNP  CBG: Recent Labs  Lab 04/14/20 1331 04/14/20 1732 04/14/20 2015 04/15/20 0830 04/15/20 1118  GLUCAP 290* 182* 176* 282* 334*    Microbiology: Results for orders placed or performed during the hospital encounter of 04/12/20  Respiratory Panel by RT PCR (Flu A&B, Covid) - Nasopharyngeal Swab     Status: Abnormal   Collection Time: 04/12/20  3:56 PM   Specimen: Nasopharyngeal Swab  Result Value Ref Range Status   SARS Coronavirus 2 by RT PCR POSITIVE (A) NEGATIVE Final    Comment: RESULT CALLED TO, READ BACK BY AND VERIFIED WITH: [facility] REGISTER AT 5916 04/12/2020 BY MOSLEY,J (NOTE)  SARS-CoV-2 target nucleic acids are DETECTED.  SARS-CoV-2 RNA is generally detectable in upper respiratory specimens  during the acute phase of infection. Positive results are indicative of the presence of the identified virus, but do not rule out bacterial infection or co-infection with other pathogens not detected by the test. Clinical correlation with patient history and other diagnostic information is necessary to determine patient infection status. The expected result is Negative.  Fact Sheet for Patients:  PinkCheek.be  Fact Sheet for Healthcare Providers: GravelBags.it  This test is not yet approved or cleared by the [place] FDA and  has been authorized for detection and/or diagnosis of SARS-CoV-2 by FDA under an Emergency Use Authorization (EUA).  This EUA will remain in effect (meaning this te st can be used) for the duration of   the COVID-19 declaration under Section 564(b)(1) of the Act, 21 U.S.C. section 360bbb-3(b)(1), unless the authorization is terminated or revoked sooner.      Influenza A by PCR NEGATIVE NEGATIVE Final   Influenza B by PCR NEGATIVE NEGATIVE Final    Comment: (NOTE) The Xpert Xpress SARS-CoV-2/FLU/RSV assay is intended as an aid in  the diagnosis of influenza from Nasopharyngeal swab specimens and  should not be used as a sole basis for treatment. Nasal washings and  aspirates are unacceptable for Xpert Xpress SARS-CoV-2/FLU/RSV  testing.  Fact Sheet for Patients: PinkCheek.be  Fact Sheet for Healthcare Providers: GravelBags.it  This test is not yet approved or cleared by the [place] FDA and  has been authorized for detection and/or diagnosis of SARS-CoV-2 by  FDA under an Emergency Use Authorization (EUA). This EUA will remain  in effect (meaning this test can be used) for the duration of the  Covid-19 declaration under Section 564(b)(1) of the Act, 21  U.S.C. section 360bbb-3(b)(1), unless the authorization is  terminated or revoked. Performed at [facility], [address] [place], [place] [address]     Coagulation Studies: No results for input(s): LABPROT, INR in the last 72 hours.  Urinalysis: Recent Labs    04/12/20 1740  COLORURINE YELLOW*  LABSPEC 1.009  PHURINE 8.0  GLUCOSEU >=500*  HGBUR SMALL*  BILIRUBINUR NEGATIVE  KETONESUR NEGATIVE  PROTEINUR 100*  NITRITE NEGATIVE  LEUKOCYTESUR MODERATE*      Imaging: No results found.   Medications:    sodium chloride     sodium chloride     remdesivir 100 mg in NS 100 mL Stopped (04/13/20 1241)    albuterol  2 puff Inhalation Q6H   amLODipine  5 mg Oral Daily   vitamin C  500 mg Oral Daily   aspirin EC  81 mg Oral Daily   atorvastatin  10 mg Oral Daily   Chlorhexidine Gluconate Cloth  6 each Topical Q0600    furosemide  40 mg Oral BID   [START ON 04/16/2020] heparin  5,000 Units Subcutaneous Q8H   hydrALAZINE  25 mg Oral BID   influenza vaccine adjuvanted  0.5 mL Intramuscular Tomorrow-1000   insulin aspart  0-6 Units Subcutaneous TID WC   insulin aspart  2 Units Subcutaneous TID WC   levothyroxine  125 mcg Oral QAC breakfast   loratadine  10 mg Oral Daily   multivitamin  1 tablet Oral Daily   omega-3 acid ethyl esters  2 g Oral BID   patiromer  16.8 g Oral Daily   pneumococcal 23 valent vaccine  0.5 mL Intramuscular Tomorrow-1000   predniSONE  60 mg Oral Q breakfast  zinc sulfate  220 mg Oral Daily   sodium chloride, sodium chloride, acetaminophen, alteplase, guaiFENesin-dextromethorphan, heparin, lidocaine (PF), lidocaine-prilocaine, lidocaine-prilocaine, ondansetron **OR** ondansetron (ZOFRAN) IV, pentafluoroprop-tetrafluoroeth, polyvinyl alcohol  Assessment/ Plan:  Alejandro Armstrong is a 86 y.o. white male with end stage renal disease on hemodialysis, hypertension, diabetes mellitus type II, hypothyroidism, idiopathic thormbocytopenia purpura, and nephrolithiasis who was admitted to [facility] on .04/12/2020  For Hyperkalemia [E87.5] ESRD (end stage renal disease) on dialysis ([place]) [N18.6, Z99.2] Generalized weakness [R53.1] COVID [U07.1]  [facility] [place] Left AVF 73 kg  1. End stage renal disease with hyperkalemia: last hemodialysis treatment was Thursday.  Now with complication of dialysis device.  - Appreciate vascular input. Scheduled for intervention this week.  - increase patiromer to 25.2g   2. Anemia of chronic kidney disease: Hemoglobin 8.9 - EPO with HD treatments.   3. Hypertension:  - amlodipine and furosemide  4. Secondary Hyperparathyroidism: with hyperphosphatemia Patient is currently not on binders.    LOS: 3 Alejandro Armstrong 10/31/20211:23 PM

## 2020-04-15 NOTE — H&P (View-Only) (Signed)
[facility] Consultation   MRN : [id number]  Alejandro Armstrong is a 86 y.o. ([birth year]) male who presents with chief complaint of  Chief Complaint  Patient presents with  . Weakness  . Fatigue  .  History of Present Illness: [name] has not had dialysis since Friday. He has not been feeling well. He has been feeling tired and fatigued. His wife recently had Covid. He has not felt well enough to go to dialysis on Monday or Wednesday. he was  Tested Covid positive on 10/27. [name]. Slight shortness of breath. In the ER, he was found to be hyperkalemic with a potassium of 6.8. Hospitalist services were contacted for further evaluation.  Patient has had multiple interventions of his LUE AVG. He was scheduled for a fistulogram for 04-24-2020 with Dr Delana Meyer. Unfortunatley the graft has failed prior to this procedure. He has a history of central stenosis with angioplasty in the past for a 95% stenosis.     Current Facility-Administered Medications  Medication Dose Route Frequency Provider Last Rate Last Admin  . 0.9 %  sodium chloride infusion  100 mL Intravenous PRN Lateef, Munsoor, MD      . 0.9 %  sodium chloride infusion  100 mL Intravenous PRN Lateef, Munsoor, MD      . acetaminophen (TYLENOL) tablet 650 mg  650 mg Oral Q6H PRN Loletha Grayer, MD   650 mg at 04/12/20 1830  . albuterol (VENTOLIN HFA) 108 (90 Base) MCG/ACT inhaler 2 puff  2 puff Inhalation Q6H Loletha Grayer, MD   2 puff at 04/14/20 1350  . alteplase (CATHFLO ACTIVASE) injection 2 mg  2 mg Intracatheter Once PRN Lateef, Munsoor, MD      . amLODipine (NORVASC) tablet 5 mg  5 mg Oral Daily Loletha Grayer, MD   5 mg at 04/15/20 0934  . ascorbic acid (VITAMIN C) tablet 500 mg  500 mg Oral Daily Loletha Grayer, MD   500 mg at 04/15/20 0934  . aspirin EC tablet 81 mg  81 mg Oral  Daily Loletha Grayer, MD   81 mg at 04/15/20 0934  . atorvastatin (LIPITOR) tablet 10 mg  10 mg Oral Daily Loletha Grayer, MD   10 mg at 04/15/20 0934  . Chlorhexidine Gluconate Cloth 2 % PADS 6 each  6 each Topical Q0600 Holley Raring, Munsoor, MD   6 each at 04/15/20 0540  . furosemide (LASIX) tablet 40 mg  40 mg Oral BID Loletha Grayer, MD   40 mg at 04/15/20 0933  . guaiFENesin-dextromethorphan (ROBITUSSIN DM) 100-10 MG/5ML syrup 10 mL  10 mL Oral Q4H PRN Wieting, Richard, MD      . heparin injection 1,000 Units  1,000 Units Dialysis PRN Holley Raring, Munsoor, MD      . heparin injection 5,000 Units  5,000 Units Subcutaneous Q8H Florencia Reasons, MD      . hydrALAZINE (APRESOLINE) tablet 25 mg  25 mg Oral BID Loletha Grayer, MD   25 mg at 04/15/20 0935  . influenza vaccine adjuvanted (FLUAD) injection 0.5 mL  0.5 mL Intramuscular Tomorrow-1000 Florencia Reasons, MD      . insulin aspart (novoLOG) injection 0-6 Units  0-6 Units Subcutaneous TID WC Florencia Reasons, MD   4 Units at 04/15/20 1158  . insulin aspart (novoLOG) injection 2 Units  2 Units Subcutaneous TID WC Florencia Reasons, MD   2 Units at 04/15/20 1159  .  levothyroxine (SYNTHROID) tablet 125 mcg  125 mcg Oral QAC breakfast Loletha Grayer, MD   125 mcg at 04/15/20 0640  . lidocaine (PF) (XYLOCAINE) 1 % injection 5 mL  5 mL Intradermal PRN Lateef, Munsoor, MD      . lidocaine-prilocaine (EMLA) cream 1 application  1 application Topical Daily PRN Wieting, Richard, MD      . lidocaine-prilocaine (EMLA) cream 1 application  1 application Topical PRN Lateef, Munsoor, MD      . loratadine (CLARITIN) tablet 10 mg  10 mg Oral Daily Loletha Grayer, MD   10 mg at 04/15/20 0934  . multivitamin (RENA-VIT) tablet 1 tablet  1 tablet Oral Daily Loletha Grayer, MD   1 tablet at 04/15/20 0934  . omega-3 acid ethyl esters (LOVAZA) capsule 2 g  2 g Oral BID Loletha Grayer, MD   2 g at 04/15/20 0934  . ondansetron (ZOFRAN) tablet 4 mg  4 mg Oral Q6H PRN Loletha Grayer, MD        Or  . ondansetron [facility]) injection 4 mg  4 mg Intravenous Q6H PRN Loletha Grayer, MD   4 mg at 04/12/20 1955  . [START ON 04/16/2020] patiromer (VELTASSA) packet 25.2 g  25.2 g Oral Daily Kolluru, Sarath, MD      . pentafluoroprop-tetrafluoroeth (GEBAUERS) aerosol 1 application  1 application Topical PRN Lateef, Munsoor, MD      . pneumococcal 23 valent vaccine (PNEUMOVAX-23) injection 0.5 mL  0.5 mL Intramuscular Tomorrow-1000 Florencia Reasons, MD      . polyvinyl alcohol (LIQUIFILM TEARS) 1.4 % ophthalmic solution 1 drop  1 drop Both Eyes TID PRN Wieting, Richard, MD      . predniSONE (DELTASONE) tablet 60 mg  60 mg Oral Q breakfast Florencia Reasons, MD   60 mg at 04/15/20 0933  . remdesivir 100 mg in sodium chloride 0.9 % 100 mL IVPB  100 mg Intravenous Daily Vira Blanco, [facility]   Stopped at 04/13/20 1241  . zinc sulfate capsule 220 mg  220 mg Oral Daily Loletha Grayer, MD   220 mg at 04/15/20 9379   Facility-Administered Medications Ordered in Other Encounters  Medication Dose Route Frequency Provider Last Rate Last Admin  . heparin lock flush 100 unit/mL  500 Units Intravenous Once [name], MD      . sodium chloride flush (NS) 0.9 % injection 10 mL  10 mL Intravenous PRN [name], MD      . sodium chloride flush (NS) 0.9 % injection 10 mL  10 mL Intravenous PRN [name], MD   10 mL at 08/16/15 0857    Past Medical History:  Diagnosis Date  . Arthritis   . Dialysis patient [facility])    Mon, Wed Fri  . Hyperlipidemia   . Hypertension   . Hypothyroidism   . Idiopathic thrombocytopenia purpura ([place])   . Kidney stones   . Recurrent UTI   . Renal agenesis    discovered at age 28  . Type 2 diabetes mellitus ([place])   . Ureteral stricture     Past Surgical History:  Procedure Laterality Date  . A/V FISTULAGRAM Left 03/31/2017   Procedure: A/V Fistulagram;  Surgeon: [name], MD;  Location: [place] [facility];  Service: Cardiovascular;  Laterality: Left;   . A/V FISTULAGRAM Left 09/07/2018   Procedure: A/V FISTULAGRAM;  Surgeon: [name], MD;  Location: [place] [facility];  Service: Cardiovascular;  Laterality: Left;  . A/V SHUNTOGRAM Left  07/28/2017   Procedure: A/V SHUNTOGRAM;  Surgeon: [name], MD;  Location: [place] [facility];  Service: Cardiovascular;  Laterality: Left;  . A/V SHUNTOGRAM Left 03/24/2018   Procedure: A/V SHUNTOGRAM;  Surgeon: [name], MD;  Location: [place] [facility];  Service: Cardiovascular;  Laterality: Left;  . APPENDECTOMY    . AV FISTULA PLACEMENT Left 04/16/2016   Procedure: INSERTION OF ARTERIOVENOUS (AV) GORE-TEX GRAFT ARM;  Surgeon: [name], MD;  Location: [facility] OR[facility];  Service: Vascular;  Laterality: Left;  . CATARACT EXTRACTION W/ INTRAOCULAR LENS IMPLANT Bilateral 2014   done 1-2 months apart at [facility].  [name] FINGER SURGERY Left 2002   pinky finger  . KIDNEY STONE SURGERY    . KYPHOPLASTY [name] 09/16/2018   Procedure: KYPHOPLASTY L3, DIABETIC;  Surgeon: Hessie Knows, MD;  Location: [facility] ORS;  Service: Orthopedics;  Laterality: [name];  . NASAL SINUS SURGERY  1985  . PERIPHERAL VASCULAR CATHETERIZATION  05/13/2016   Procedure: Upper Extremity Angiography;  Surgeon: [name], MD;  Location: [place] [facility];  Service: Cardiovascular;;  . PERIPHERAL VASCULAR CATHETERIZATION  05/13/2016   Procedure: Upper Extremity Intervention;  Surgeon: [name], MD;  Location: [place] [facility];  Service: Cardiovascular;;  . PICC LINE PLACE PERIPHERAL ([place] HX) Right 08/2015  . PICC LINE REMOVAL ([place] HX) Right 09/2015    Social History Social History   Tobacco Use  . Smoking status: Never Smoker  . Smokeless tobacco: Never Used  Vaping Use  . Vaping Use: Never used  Substance Use Topics  . Alcohol use: No    Alcohol/week: 0.0 standard drinks  . Drug use: No    Family History Family History  Problem Relation Age of Onset  . Cervical cancer Sister   .  CAD Father     No Known Allergies   REVIEW OF SYSTEMS (Negative unless checked)  Constitutional: [] Weight loss  [] Fever  [] Chills Cardiac: [] Chest pain   [] Chest pressure   [] Palpitations   [] Shortness of breath when laying flat   [] Shortness of breath at rest   [] Shortness of breath with exertion. Vascular:  [] Pain in legs with walking   [] Pain in legs at rest   [] Pain in legs when laying flat   [] Claudication   [] Pain in feet when walking  [] Pain in feet at rest  [] Pain in feet when laying flat   [] History of DVT   [] Phlebitis   [] Swelling in legs   [] Varicose veins   [] Non-healing ulcers Pulmonary:   [] Uses home oxygen   [] Productive cough   [] Hemoptysis   [] Wheeze  [] COPD   [] Asthma Neurologic:  [] Dizziness  [] Blackouts   [] Seizures   [] History of stroke   [] History of TIA  [] Aphasia   [] Temporary blindness   [] Dysphagia   [] Weakness or numbness in arms   [] Weakness or numbness in legs Musculoskeletal:  [] Arthritis   [] Joint swelling   [] Joint pain   [] Low back pain Hematologic:  [] Easy bruising  [] Easy bleeding   [] Hypercoagulable state   [] Anemic  [] Hepatitis Gastrointestinal:  [] Blood in stool   [] Vomiting blood  [] Gastroesophageal reflux/heartburn   [] Difficulty swallowing. Genitourinary:  [] Chronic kidney disease   [] Difficult urination  [] Frequent urination  [] Burning with urination   [] Blood in urine Skin:  [] Rashes   [] Ulcers   [] Wounds Psychological:  [] History of anxiety   []  History of major depression.  Physical Examination  Vitals:   04/14/20 2012 04/15/20 8295 04/15/20 6213 04/15/20 1120  BP: (!) 158/82 (!) 147/73 (!) 154/70 110/83  Pulse: 90 (!) 58 64 61  Resp: 20  18 18   Temp: 97.6 F (36.4 C) 98 F (36.7 C) 97.6 F (36.4 C) 97.6 F (36.4 C)  TempSrc: Oral Oral Oral Oral  SpO2: 100%  100% 97%  Weight:      Height:       Body mass index is 24.69 kg/m.  Head: [place]/AT Ear/Nose/Throat: Hearing grossly intact, nares w/o erythema or drainage, oropharynx w/o  Erythema/Exudate Eyes: PERRLA, EOMI.  Neck: Supple, no nuchal rigidity.  No JVD.  Pulmonary:  Good air movement, clear to auscultation bilaterally.  Cardiac: RRR, normal S1, S2, no Murmurs, rubs or gallops.  Vascular: Palpable pulses at all levels  Gastrointestinal: soft, non-tender/non-distended. No guarding/reflex. No masses, surgical incisions, or scars. Musculoskeletal: M/S 5/5 throughout.  Extremities without ischemic changes.  No deformity or atrophy. No edema. Neurologic: CN 2-12 intact. Pain and light touch intact in extremities.  Symmetrical.  Speech is fluent. Motor exam as listed above. Psychiatric: Judgment intact, Mood & affect appropriate for pt's clinical situation. Dermatologic: No rashes or ulcers noted.  No cellulitis or open wounds. Lymph : No Cervical, Axillary, or Inguinal lymphadenopathy.   CBC Lab Results  Component Value Date   WBC 5.7 04/14/2020   HGB 8.9 (L) 04/14/2020   HCT 26.2 (L) 04/14/2020   MCV 97.8 04/14/2020   PLT 166 04/14/2020    BMET    Component Value Date/Time   NA 134 (L) 04/14/2020 2356   NA 137 10/15/2012 2113   K 5.6 (H) 04/14/2020 2356   K 4.2 10/15/2012 2113   CL 103 04/14/2020 2356   CL 110 (H) 10/15/2012 2113   CO2 17 (L) 04/14/2020 2356   CO2 16 (L) 10/15/2012 2113   GLUCOSE 202 (H) 04/14/2020 2356   GLUCOSE 142 (H) 10/15/2012 2113   BUN 80 (H) 04/14/2020 2356   BUN 51 (H) 10/15/2012 2113   CREATININE 10.49 (H) 04/14/2020 2356   CREATININE 3.24 (H) 10/15/2012 2113   CALCIUM 9.0 04/14/2020 2356   CALCIUM 8.7 10/15/2012 2113   GFRNONAA 4 (L) 04/14/2020 2356   GFRNONAA 17 (L) 10/15/2012 2113   GFRAA 9 (L) 11/04/2019 2028   GFRAA 20 (L) 10/15/2012 2113   Estimated Creatinine Clearance: 4.6 mL/min (A) (by C-G formula based on SCr of 10.49 mg/dL (H)).  COAG Lab Results  Component Value Date   INR 1.0 09/07/2018   INR 0.94 04/02/2016   INR 1.96 12/17/2015    Assessment/Plan 86 yo with ERSD admitted with multiple  medical issues including a COVID positive testing but has no symptoms. His LUE AVG has failed and will require either another attempted declot vs permacath and new access. We will make him NPO and will discuss with Dr Lucky Cowboy for intervention tomorrow.   Thanks   Shaune Spittle, MD Vascular and Endovascular Surgery  04/15/2020 1:58 PM

## 2020-04-15 NOTE — Consult Note (Signed)
[facility] Consultation   MRN : [id number]  Alejandro Armstrong is a 86 y.o. (Jul 05, 1929) male who presents with chief complaint of  Chief Complaint  Patient presents with  . Weakness  . Fatigue  .  History of Present Illness: [name] has not had dialysis since Friday. He has not been feeling well. He has been feeling tired and fatigued. His wife recently had Covid. He has not felt well enough to go to dialysis on Monday or Wednesday. he was  Tested Covid positive on 10/27. [name]. Slight shortness of breath. In the ER, he was found to be hyperkalemic with a potassium of 6.8. Hospitalist services were contacted for further evaluation.  Patient has had multiple interventions of his LUE AVG. He was scheduled for a fistulogram for 04-24-2020 with Dr Delana Meyer. Unfortunatley the graft has failed prior to this procedure. He has a history of central stenosis with angioplasty in the past for a 95% stenosis.     Current Facility-Administered Medications  Medication Dose Route Frequency Provider Last Rate Last Admin  . 0.9 %  sodium chloride infusion  100 mL Intravenous PRN Lateef, Munsoor, MD      . 0.9 %  sodium chloride infusion  100 mL Intravenous PRN Lateef, Munsoor, MD      . acetaminophen (TYLENOL) tablet 650 mg  650 mg Oral Q6H PRN Loletha Grayer, MD   650 mg at 04/12/20 1830  . albuterol (VENTOLIN HFA) 108 (90 Base) MCG/ACT inhaler 2 puff  2 puff Inhalation Q6H Loletha Grayer, MD   2 puff at 04/14/20 1350  . alteplase (CATHFLO ACTIVASE) injection 2 mg  2 mg Intracatheter Once PRN Lateef, Munsoor, MD      . amLODipine (NORVASC) tablet 5 mg  5 mg Oral Daily Loletha Grayer, MD   5 mg at 04/15/20 0934  . ascorbic acid (VITAMIN C) tablet 500 mg  500 mg Oral Daily Loletha Grayer, MD   500 mg at 04/15/20 0934  . aspirin EC tablet 81 mg  81 mg Oral  Daily Loletha Grayer, MD   81 mg at 04/15/20 0934  . atorvastatin (LIPITOR) tablet 10 mg  10 mg Oral Daily Loletha Grayer, MD   10 mg at 04/15/20 0934  . Chlorhexidine Gluconate Cloth 2 % PADS 6 each  6 each Topical Q0600 Holley Raring, Munsoor, MD   6 each at 04/15/20 0540  . furosemide (LASIX) tablet 40 mg  40 mg Oral BID Loletha Grayer, MD   40 mg at 04/15/20 0933  . guaiFENesin-dextromethorphan (ROBITUSSIN DM) 100-10 MG/5ML syrup 10 mL  10 mL Oral Q4H PRN Wieting, Richard, MD      . heparin injection 1,000 Units  1,000 Units Dialysis PRN Holley Raring, Munsoor, MD      . heparin injection 5,000 Units  5,000 Units Subcutaneous Q8H Florencia Reasons, MD      . hydrALAZINE (APRESOLINE) tablet 25 mg  25 mg Oral BID Loletha Grayer, MD   25 mg at 04/15/20 0935  . influenza vaccine adjuvanted (FLUAD) injection 0.5 mL  0.5 mL Intramuscular Tomorrow-1000 Florencia Reasons, MD      . insulin aspart (novoLOG) injection 0-6 Units  0-6 Units Subcutaneous TID WC Florencia Reasons, MD   4 Units at 04/15/20 1158  . insulin aspart (novoLOG) injection 2 Units  2 Units Subcutaneous TID WC Florencia Reasons, MD   2 Units at 04/15/20 1159  .  levothyroxine (SYNTHROID) tablet 125 mcg  125 mcg Oral QAC breakfast Loletha Grayer, MD   125 mcg at 04/15/20 0640  . lidocaine (PF) (XYLOCAINE) 1 % injection 5 mL  5 mL Intradermal PRN Lateef, Munsoor, MD      . lidocaine-prilocaine (EMLA) cream 1 application  1 application Topical Daily PRN Wieting, Richard, MD      . lidocaine-prilocaine (EMLA) cream 1 application  1 application Topical PRN Lateef, Munsoor, MD      . loratadine (CLARITIN) tablet 10 mg  10 mg Oral Daily Loletha Grayer, MD   10 mg at 04/15/20 0934  . multivitamin (RENA-VIT) tablet 1 tablet  1 tablet Oral Daily Loletha Grayer, MD   1 tablet at 04/15/20 0934  . omega-3 acid ethyl esters (LOVAZA) capsule 2 g  2 g Oral BID Loletha Grayer, MD   2 g at 04/15/20 0934  . ondansetron (ZOFRAN) tablet 4 mg  4 mg Oral Q6H PRN Loletha Grayer, MD        Or  . ondansetron [facility]) injection 4 mg  4 mg Intravenous Q6H PRN Loletha Grayer, MD   4 mg at 04/12/20 1955  . [START ON 04/16/2020] patiromer (VELTASSA) packet 25.2 g  25.2 g Oral Daily Kolluru, Sarath, MD      . pentafluoroprop-tetrafluoroeth (GEBAUERS) aerosol 1 application  1 application Topical PRN Lateef, Munsoor, MD      . pneumococcal 23 valent vaccine (PNEUMOVAX-23) injection 0.5 mL  0.5 mL Intramuscular Tomorrow-1000 Florencia Reasons, MD      . polyvinyl alcohol (LIQUIFILM TEARS) 1.4 % ophthalmic solution 1 drop  1 drop Both Eyes TID PRN Wieting, Richard, MD      . predniSONE (DELTASONE) tablet 60 mg  60 mg Oral Q breakfast Florencia Reasons, MD   60 mg at 04/15/20 0933  . remdesivir 100 mg in sodium chloride 0.9 % 100 mL IVPB  100 mg Intravenous Daily Vira Blanco, [facility]   Stopped at 04/13/20 1241  . zinc sulfate capsule 220 mg  220 mg Oral Daily Loletha Grayer, MD   220 mg at 04/15/20 6789   Facility-Administered Medications Ordered in Other Encounters  Medication Dose Route Frequency Provider Last Rate Last Admin  . heparin lock flush 100 unit/mL  500 Units Intravenous Once [name], MD      . sodium chloride flush (NS) 0.9 % injection 10 mL  10 mL Intravenous PRN [name], MD      . sodium chloride flush (NS) 0.9 % injection 10 mL  10 mL Intravenous PRN [name], MD   10 mL at 08/16/15 0857    Past Medical History:  Diagnosis Date  . Arthritis   . Dialysis patient [facility])    Mon, Wed Fri  . Hyperlipidemia   . Hypertension   . Hypothyroidism   . Idiopathic thrombocytopenia purpura ([place])   . Kidney stones   . Recurrent UTI   . Renal agenesis    discovered at age 38  . Type 2 diabetes mellitus ([place])   . Ureteral stricture     Past Surgical History:  Procedure Laterality Date  . A/V FISTULAGRAM Left 03/31/2017   Procedure: A/V Fistulagram;  Surgeon: [name], MD;  Location: [place] [facility];  Service: Cardiovascular;  Laterality: Left;   . A/V FISTULAGRAM Left 09/07/2018   Procedure: A/V FISTULAGRAM;  Surgeon: [name], MD;  Location: [place] [facility];  Service: Cardiovascular;  Laterality: Left;  . A/V SHUNTOGRAM Left  07/28/2017   Procedure: A/V SHUNTOGRAM;  Surgeon: [name], MD;  Location: [place] [facility];  Service: Cardiovascular;  Laterality: Left;  . A/V SHUNTOGRAM Left 03/24/2018   Procedure: A/V SHUNTOGRAM;  Surgeon: [name], MD;  Location: [place] [facility];  Service: Cardiovascular;  Laterality: Left;  . APPENDECTOMY    . AV FISTULA PLACEMENT Left 04/16/2016   Procedure: INSERTION OF ARTERIOVENOUS (AV) GORE-TEX GRAFT ARM;  Surgeon: [name], MD;  Location: [facility];  Service: Vascular;  Laterality: Left;  . CATARACT EXTRACTION W/ INTRAOCULAR LENS IMPLANT Bilateral 2014   done 1-2 months apart at [facility].  [name] FINGER SURGERY Left 2002   pinky finger  . KIDNEY STONE SURGERY    . KYPHOPLASTY [name] 09/16/2018   Procedure: KYPHOPLASTY L3, DIABETIC;  Surgeon: Hessie Knows, MD;  Location: [facility];  Service: Orthopedics;  Laterality: [name];  . NASAL SINUS SURGERY  1985  . PERIPHERAL VASCULAR CATHETERIZATION  05/13/2016   Procedure: Upper Extremity Angiography;  Surgeon: [name], MD;  Location: [place] [facility];  Service: Cardiovascular;;  . PERIPHERAL VASCULAR CATHETERIZATION  05/13/2016   Procedure: Upper Extremity Intervention;  Surgeon: [name], MD;  Location: [place] [facility];  Service: Cardiovascular;;  . PICC LINE PLACE PERIPHERAL ([place] HX) Right 08/2015  . PICC LINE REMOVAL ([place] HX) Right 09/2015    Social History Social History   Tobacco Use  . Smoking status: Never Smoker  . Smokeless tobacco: Never Used  Vaping Use  . Vaping Use: Never used  Substance Use Topics  . Alcohol use: No    Alcohol/week: 0.0 standard drinks  . Drug use: No    Family History Family History  Problem Relation Age of Onset  . Cervical cancer Sister   .  CAD Father     No Known Allergies   REVIEW OF SYSTEMS (Negative unless checked)  Constitutional: [] Weight loss  [] Fever  [] Chills Cardiac: [] Chest pain   [] Chest pressure   [] Palpitations   [] Shortness of breath when laying flat   [] Shortness of breath at rest   [] Shortness of breath with exertion. Vascular:  [] Pain in legs with walking   [] Pain in legs at rest   [] Pain in legs when laying flat   [] Claudication   [] Pain in feet when walking  [] Pain in feet at rest  [] Pain in feet when laying flat   [] History of DVT   [] Phlebitis   [] Swelling in legs   [] Varicose veins   [] Non-healing ulcers Pulmonary:   [] Uses home oxygen   [] Productive cough   [] Hemoptysis   [] Wheeze  [] COPD   [] Asthma Neurologic:  [] Dizziness  [] Blackouts   [] Seizures   [] History of stroke   [] History of TIA  [] Aphasia   [] Temporary blindness   [] Dysphagia   [] Weakness or numbness in arms   [] Weakness or numbness in legs Musculoskeletal:  [] Arthritis   [] Joint swelling   [] Joint pain   [] Low back pain Hematologic:  [] Easy bruising  [] Easy bleeding   [] Hypercoagulable state   [] Anemic  [] Hepatitis Gastrointestinal:  [] Blood in stool   [] Vomiting blood  [] Gastroesophageal reflux/heartburn   [] Difficulty swallowing. Genitourinary:  [] Chronic kidney disease   [] Difficult urination  [] Frequent urination  [] Burning with urination   [] Blood in urine Skin:  [] Rashes   [] Ulcers   [] Wounds Psychological:  [] History of anxiety   []  History of major depression.  Physical Examination  Vitals:   04/14/20 2012 04/15/20 0981 04/15/20 1914 04/15/20 1120  BP: (!) 158/82 (!) 147/73 (!) 154/70 110/83  Pulse: 90 (!) 58 64 61  Resp: 20  18 18   Temp: 97.6 F (36.4 C) 98 F (36.7 C) 97.6 F (36.4 C) 97.6 F (36.4 C)  TempSrc: Oral Oral Oral Oral  SpO2: 100%  100% 97%  Weight:      Height:       Body mass index is 24.69 kg/m.  Head: [place]/AT Ear/Nose/Throat: Hearing grossly intact, nares w/o erythema or drainage, oropharynx w/o  Erythema/Exudate Eyes: PERRLA, EOMI.  Neck: Supple, no nuchal rigidity.  No JVD.  Pulmonary:  Good air movement, clear to auscultation bilaterally.  Cardiac: RRR, normal S1, S2, no Murmurs, rubs or gallops.  Vascular: Palpable pulses at all levels  Gastrointestinal: soft, non-tender/non-distended. No guarding/reflex. No masses, surgical incisions, or scars. Musculoskeletal: M/S 5/5 throughout.  Extremities without ischemic changes.  No deformity or atrophy. No edema. Neurologic: CN 2-12 intact. Pain and light touch intact in extremities.  Symmetrical.  Speech is fluent. Motor exam as listed above. Psychiatric: Judgment intact, Mood & affect appropriate for pt's clinical situation. Dermatologic: No rashes or ulcers noted.  No cellulitis or open wounds. Lymph : No Cervical, Axillary, or Inguinal lymphadenopathy.   CBC Lab Results  Component Value Date   WBC 5.7 04/14/2020   HGB 8.9 (L) 04/14/2020   HCT 26.2 (L) 04/14/2020   MCV 97.8 04/14/2020   PLT 166 04/14/2020    BMET    Component Value Date/Time   NA 134 (L) 04/14/2020 2356   NA 137 10/15/2012 2113   K 5.6 (H) 04/14/2020 2356   K 4.2 10/15/2012 2113   CL 103 04/14/2020 2356   CL 110 (H) 10/15/2012 2113   CO2 17 (L) 04/14/2020 2356   CO2 16 (L) 10/15/2012 2113   GLUCOSE 202 (H) 04/14/2020 2356   GLUCOSE 142 (H) 10/15/2012 2113   BUN 80 (H) 04/14/2020 2356   BUN 51 (H) 10/15/2012 2113   CREATININE 10.49 (H) 04/14/2020 2356   CREATININE 3.24 (H) 10/15/2012 2113   CALCIUM 9.0 04/14/2020 2356   CALCIUM 8.7 10/15/2012 2113   GFRNONAA 4 (L) 04/14/2020 2356   GFRNONAA 17 (L) 10/15/2012 2113   GFRAA 9 (L) 11/04/2019 2028   GFRAA 20 (L) 10/15/2012 2113   Estimated Creatinine Clearance: 4.6 mL/min (A) (by C-G formula based on SCr of 10.49 mg/dL (H)).  COAG Lab Results  Component Value Date   INR 1.0 09/07/2018   INR 0.94 04/02/2016   INR 1.96 12/17/2015    Assessment/Plan 86 yo with ERSD admitted with multiple  medical issues including a COVID positive testing but has no symptoms. His LUE AVG has failed and will require either another attempted declot vs permacath and new access. We will make him NPO and will discuss with Dr Lucky Cowboy for intervention tomorrow.   Thanks   Shaune Spittle, MD Vascular and Endovascular Surgery  04/15/2020 1:58 PM

## 2020-04-16 ENCOUNTER — Other Ambulatory Visit (INDEPENDENT_AMBULATORY_CARE_PROVIDER_SITE_OTHER): Payer: Self-pay | Admitting: Vascular Surgery

## 2020-04-16 ENCOUNTER — Encounter
Admission: EM | Disposition: A | Discharge status: Home Health | Payer: Self-pay | Source: Home / Self Care | Attending: Internal Medicine

## 2020-04-16 DIAGNOSIS — U071 COVID-19: Secondary | ICD-10-CM

## 2020-04-16 DIAGNOSIS — N185 Chronic kidney disease, stage 5: Secondary | ICD-10-CM

## 2020-04-16 HISTORY — PX: PERIPHERAL VASCULAR THROMBECTOMY: CATH118306

## 2020-04-16 LAB — COMPREHENSIVE METABOLIC PANEL
ALT: 20 U/L (ref 0–44)
AST: 22 U/L (ref 15–41)
Albumin: 3.5 g/dL (ref 3.5–5.0)
Alkaline Phosphatase: 78 U/L (ref 38–126)
Anion gap: 14 (ref 5–15)
BUN: 102 mg/dL — ABNORMAL HIGH (ref 8–23)
CO2: 17 mmol/L — ABNORMAL LOW (ref 22–32)
Calcium: 8.7 mg/dL — ABNORMAL LOW (ref 8.9–10.3)
Chloride: 101 mmol/L (ref 98–111)
Creatinine, Ser: 10.83 mg/dL — ABNORMAL HIGH (ref 0.61–1.24)
GFR, Estimated: 4 mL/min — ABNORMAL LOW (ref >60)
Glucose, Bld: 217 mg/dL — ABNORMAL HIGH (ref 70–99)
Potassium: 6 mmol/L — ABNORMAL HIGH (ref 3.5–5.1)
Sodium: 132 mmol/L — ABNORMAL LOW (ref 135–145)
Total Bilirubin: 1 mg/dL (ref 0.3–1.2)
Total Protein: 6.9 g/dL (ref 6.5–8.1)

## 2020-04-16 LAB — CBC WITH DIFFERENTIAL/PLATELET
Abs Immature Granulocytes: 0.06 10*3/uL (ref 0.00–0.07)
Basophils Absolute: 0 10*3/uL (ref 0.0–0.1)
Basophils Relative: 0 %
Eosinophils Absolute: 0 10*3/uL (ref 0.0–0.5)
Eosinophils Relative: 0 %
HCT: 28.8 % — ABNORMAL LOW (ref 39.0–52.0)
Hemoglobin: 9.5 g/dL — ABNORMAL LOW (ref 13.0–17.0)
Immature Granulocytes: 1 %
Lymphocytes Relative: 17 %
Lymphs Abs: 1 10*3/uL (ref 0.7–4.0)
MCH: 32.4 pg (ref 26.0–34.0)
MCHC: 33 g/dL (ref 30.0–36.0)
MCV: 98.3 fL (ref 80.0–100.0)
Monocytes Absolute: 0.4 10*3/uL (ref 0.1–1.0)
Monocytes Relative: 6 %
Neutro Abs: 4.6 10*3/uL (ref 1.7–7.7)
Neutrophils Relative %: 76 %
Platelets: 177 10*3/uL (ref 150–400)
RBC: 2.93 MIL/uL — ABNORMAL LOW (ref 4.22–5.81)
RDW: 14.7 % (ref 11.5–15.5)
WBC: 6 10*3/uL (ref 4.0–10.5)
nRBC: 0 % (ref 0.0–0.2)

## 2020-04-16 LAB — GLUCOSE, CAPILLARY
Glucose-Capillary: 214 mg/dL — ABNORMAL HIGH (ref 70–99)
Glucose-Capillary: 266 mg/dL — ABNORMAL HIGH (ref 70–99)

## 2020-04-16 LAB — C-REACTIVE PROTEIN: CRP: 0.6 mg/dL (ref <1.0)

## 2020-04-16 LAB — FIBRIN DERIVATIVES D-DIMER (ARMC ONLY): Fibrin derivatives D-dimer (ARMC): 1218.96 ng/mL (FEU) — ABNORMAL HIGH (ref 0.00–499.00)

## 2020-04-16 LAB — HEMOGLOBIN A1C
Hgb A1c MFr Bld: 6.9 % — ABNORMAL HIGH (ref 4.8–5.6)
Mean Plasma Glucose: 151.33 mg/dL

## 2020-04-16 LAB — FERRITIN: Ferritin: 1154 ng/mL — ABNORMAL HIGH (ref 24–336)

## 2020-04-16 SURGERY — PERIPHERAL VASCULAR THROMBECTOMY
Anesthesia: Moderate Sedation

## 2020-04-16 MED ORDER — FENTANYL CITRATE (PF) 100 MCG/2ML IJ SOLN
INTRAMUSCULAR | Status: AC
  Filled 2020-04-16: qty 2

## 2020-04-16 MED ORDER — MIDAZOLAM HCL 2 MG/ML PO SYRP
8.0000 mg | ORAL_SOLUTION | Freq: Once | ORAL | Status: DC | PRN
  Filled 2020-04-16: qty 4

## 2020-04-16 MED ORDER — CEFAZOLIN SODIUM-DEXTROSE 1-4 GM/50ML-% IV SOLN
1.0000 g | Freq: Once | INTRAVENOUS | Status: DC
  Filled 2020-04-16: qty 50

## 2020-04-16 MED ORDER — CEFAZOLIN SODIUM-DEXTROSE 1-4 GM/50ML-% IV SOLN
INTRAVENOUS | Status: AC
  Filled 2020-04-16: qty 50

## 2020-04-16 MED ORDER — HEPARIN SODIUM (PORCINE) 5000 UNIT/ML IJ SOLN: 5000.0000 [IU] | Freq: Three times a day (TID) | INTRAMUSCULAR | Status: DC

## 2020-04-16 MED ORDER — METHYLPREDNISOLONE SODIUM SUCC 125 MG IJ SOLR: 125.0000 mg | Freq: Once | INTRAMUSCULAR | Status: DC | PRN

## 2020-04-16 MED ORDER — FAMOTIDINE 20 MG PO TABS: 40.0000 mg | ORAL_TABLET | Freq: Once | ORAL | Status: DC | PRN

## 2020-04-16 MED ORDER — HEPARIN SODIUM (PORCINE) 1000 UNIT/ML IJ SOLN
INTRAMUSCULAR | Status: AC
  Filled 2020-04-16: qty 1

## 2020-04-16 MED ORDER — CEFAZOLIN SODIUM-DEXTROSE 2-4 GM/100ML-% IV SOLN
2.0000 g | INTRAVENOUS | Status: AC
  Administered 2020-04-16: 1 g via INTRAVENOUS
  Filled 2020-04-16: qty 100

## 2020-04-16 MED ORDER — MIDAZOLAM HCL 2 MG/2ML IJ SOLN
INTRAMUSCULAR | Status: DC | PRN
  Administered 2020-04-16: 1 mg via INTRAVENOUS

## 2020-04-16 MED ORDER — MIDAZOLAM HCL 2 MG/2ML IJ SOLN
INTRAMUSCULAR | Status: AC
  Filled 2020-04-16: qty 2

## 2020-04-16 MED ORDER — HYDROMORPHONE HCL 1 MG/ML IJ SOLN: 1.0000 mg | Freq: Once | INTRAMUSCULAR | Status: DC | PRN

## 2020-04-16 MED ORDER — QUETIAPINE FUMARATE 25 MG PO TABS
12.5000 mg | ORAL_TABLET | Freq: Once | ORAL | Status: AC
  Administered 2020-04-16: 23:00:00 12.5 mg via ORAL
  Filled 2020-04-16: qty 1

## 2020-04-16 MED ORDER — ONDANSETRON HCL 4 MG/2ML IJ SOLN: 4.0000 mg | Freq: Four times a day (QID) | INTRAMUSCULAR | Status: DC | PRN

## 2020-04-16 MED ORDER — DIPHENHYDRAMINE HCL 50 MG/ML IJ SOLN: 50.0000 mg | Freq: Once | INTRAMUSCULAR | Status: DC | PRN

## 2020-04-16 MED ORDER — FENTANYL CITRATE (PF) 100 MCG/2ML IJ SOLN
INTRAMUSCULAR | Status: DC | PRN
  Administered 2020-04-16: 50 ug via INTRAVENOUS

## 2020-04-16 MED ORDER — PREDNISONE 20 MG PO TABS
40.0000 mg | ORAL_TABLET | Freq: Every day | ORAL | Status: DC
  Administered 2020-04-17 – 2020-04-20 (×4): 40 mg via ORAL
  Filled 2020-04-16 (×4): qty 2

## 2020-04-16 MED ORDER — SODIUM CHLORIDE 0.9 % IV SOLN: INTRAVENOUS | Status: DC

## 2020-04-16 MED ORDER — HEPARIN SODIUM (PORCINE) 5000 UNIT/ML IJ SOLN
5000.0000 [IU] | Freq: Three times a day (TID) | INTRAMUSCULAR | Status: DC
  Administered 2020-04-16 – 2020-04-20 (×11): 5000 [IU] via SUBCUTANEOUS
  Filled 2020-04-16 (×11): qty 1

## 2020-04-16 MED ORDER — EPOETIN ALFA 4000 UNIT/ML IJ SOLN: 4000.0000 [IU] | INTRAMUSCULAR | Status: DC

## 2020-04-16 SURGICAL SUPPLY — 6 items
CATH PALINDROME-P 19CM W/VT (CATHETERS) ×2 IMPLANT
DERMABOND ADVANCED (GAUZE/BANDAGES/DRESSINGS) ×2
DERMABOND ADVANCED .7 DNX12 (GAUZE/BANDAGES/DRESSINGS) IMPLANT
PACK ANGIOGRAPHY (CUSTOM PROCEDURE TRAY) ×3 IMPLANT
SUT MNCRL AB 4-0 PS2 18 (SUTURE) ×2 IMPLANT
SUT PROLENE 0 CT 1 30 (SUTURE) ×2 IMPLANT

## 2020-04-16 NOTE — Progress Notes (Signed)
Patient returned to unit from vascular procedural area. Pt sleepy but arousable to voice. Pt demanding dinner tray. Tray ordered from dietary. Pt refusing blood glucose check.

## 2020-04-16 NOTE — Progress Notes (Signed)
[facility]  ROUNDING NOTE   Subjective:   Patient is NPO for vascular procedure today to declot or placement of a new dialysis access  as his LUA AV Graft is not working.  Objective:  Vital signs in last 24 hours:  Temp:  [97.2 F (36.2 C)-98.1 F (36.7 C)] 97.2 F (36.2 C) (11/01 0738) Pulse Rate:  [58-69] 58 (11/01 0738) Resp:  [16-18] 18 (11/01 0738) BP: (126-147)/(65-75) 126/75 (11/01 0738) SpO2:  [94 %-100 %] 97 % (11/01 0738)  Weight change:  Filed Weights   04/12/20 1441  Weight: 72.6 kg    Intake/Output: I/O last 3 completed shifts: In: 96.6 [IV Piggyback:96.6] Out: 1700 [Urine:1700]   Intake/Output this shift:  No intake/output data recorded.  Physical Exam: General: Resting in bed  Head: Moist oral mucosal membranes  Eyes: Sclerae and conjunctivae clear  Lungs:  Respirations even,unlabored,lungs clear  Heart: S1S2, no rubs or gallops  Abdomen:  Soft, nontender, non distended  Extremities:  No  peripheral edema.  Neurologic: Oriented,HOH  Skin: No lesions or rashes noted  Access: Lt Upper Arm AVF no bruit or thrill    Basic Metabolic Panel: Recent Labs  Lab 04/12/20 1449 04/12/20 1449 04/12/20 1740 04/13/20 0646 04/13/20 0646 04/14/20 0432 04/14/20 2356 04/16/20 0856  NA 140  --   --  139  --  138 134* 132*  K 6.8*  --   --  5.6*  --  5.5* 5.6* 6.0*  CL 110  --   --  104  --  106 103 101  CO2 17*  --   --  20*  --  17* 17* 17*  GLUCOSE 198*  --   --  283*  --  239* 202* 217*  BUN 68*  --   --  54*  --  64* 80* 102*  CREATININE 12.18*  --   --  9.75*  --  10.43* 10.49* 10.83*  CALCIUM 9.6   < >  --  9.3   < > 9.4 9.0 8.7*  PHOS  --   --  4.1  --   --   --  6.8*  --    < > = values in this interval not displayed.    Liver Function Tests: Recent Labs  Lab 04/13/20 0646 04/14/20 0432 04/14/20 2356 04/16/20 0856  AST 21 30 28 22   ALT 13 15 16 20   ALKPHOS 84 81 69 78  BILITOT 0.9 0.8 0.7 1.0  PROT 7.5 7.3 6.5 6.9   ALBUMIN 3.6 3.6 3.3* 3.5   No results for input(s): LIPASE, AMYLASE in the last 168 hours. No results for input(s): AMMONIA in the last 168 hours.  CBC: Recent Labs  Lab 04/12/20 1449 04/13/20 0646 04/14/20 0432 04/14/20 2356 04/16/20 0856  WBC 6.2 2.8* 4.8 5.7 6.0  NEUTROABS  --  2.2 3.4 4.5 4.6  HGB 11.2* 9.7* 9.7* 8.9* 9.5*  HCT 33.9* 29.0* 30.4* 26.2* 28.8*  MCV 99.4 98.3 102.7* 97.8 98.3  PLT 175 130* 157 166 177    Cardiac Enzymes: No results for input(s): CKTOTAL, CKMB, CKMBINDEX, TROPONINI in the last 168 hours.  BNP: Invalid input(s): POCBNP  CBG: Recent Labs  Lab 04/15/20 0830 04/15/20 1118 04/15/20 1626 04/15/20 2134 04/16/20 1141  GLUCAP 282* 334* 224* 248* 214*    Microbiology: Results for orders placed or performed during the hospital encounter of 04/12/20  Respiratory Panel by RT PCR (Flu A&B, Covid) - Nasopharyngeal Swab     Status:  Abnormal   Collection Time: 04/12/20  3:56 PM   Specimen: Nasopharyngeal Swab  Result Value Ref Range Status   SARS Coronavirus 2 by RT PCR POSITIVE (A) NEGATIVE Final    Comment: RESULT CALLED TO, READ BACK BY AND VERIFIED WITH: [facility] REGISTER AT 9211 04/12/2020 BY MOSLEY,J (NOTE) SARS-CoV-2 target nucleic acids are DETECTED.  SARS-CoV-2 RNA is generally detectable in upper respiratory specimens  during the acute phase of infection. Positive results are indicative of the presence of the identified virus, but do not rule out bacterial infection or co-infection with other pathogens not detected by the test. Clinical correlation with patient history and other diagnostic information is necessary to determine patient infection status. The expected result is Negative.  Fact Sheet for Patients:  PinkCheek.be  Fact Sheet for Healthcare Providers: GravelBags.it  This test is not yet approved or cleared by the [place] FDA and  has been authorized for  detection and/or diagnosis of SARS-CoV-2 by FDA under an Emergency Use Authorization (EUA).  This EUA will remain in effect (meaning this te st can be used) for the duration of  the COVID-19 declaration under Section 564(b)(1) of the Act, 21 U.S.C. section 360bbb-3(b)(1), unless the authorization is terminated or revoked sooner.      Influenza A by PCR NEGATIVE NEGATIVE Final   Influenza B by PCR NEGATIVE NEGATIVE Final    Comment: (NOTE) The Xpert Xpress SARS-CoV-2/FLU/RSV assay is intended as an aid in  the diagnosis of influenza from Nasopharyngeal swab specimens and  should not be used as a sole basis for treatment. Nasal washings and  aspirates are unacceptable for Xpert Xpress SARS-CoV-2/FLU/RSV  testing.  Fact Sheet for Patients: PinkCheek.be  Fact Sheet for Healthcare Providers: GravelBags.it  This test is not yet approved or cleared by the [place] FDA and  has been authorized for detection and/or diagnosis of SARS-CoV-2 by  FDA under an Emergency Use Authorization (EUA). This EUA will remain  in effect (meaning this test can be used) for the duration of the  Covid-19 declaration under Section 564(b)(1) of the Act, 21  U.S.C. section 360bbb-3(b)(1), unless the authorization is  terminated or revoked. Performed at [facility], [address] [place], [place] [address]     Coagulation Studies: No results for input(s): LABPROT, INR in the last 72 hours.  Urinalysis: No results for input(s): COLORURINE, LABSPEC, PHURINE, GLUCOSEU, HGBUR, BILIRUBINUR, KETONESUR, PROTEINUR, UROBILINOGEN, NITRITE, LEUKOCYTESUR in the last 72 hours.  Invalid input(s): APPERANCEUR    Imaging: No results found.   Medications:   . sodium chloride    . sodium chloride    . sodium chloride    .  ceFAZolin (ANCEF) IV    . remdesivir 100 mg in NS 100 mL Stopped (04/13/20 1241)   . albuterol  2 puff Inhalation  Q6H  . amLODipine  5 mg Oral Daily  . vitamin C  500 mg Oral Daily  . aspirin EC  81 mg Oral Daily  . atorvastatin  10 mg Oral Daily  . Chlorhexidine Gluconate Cloth  6 each Topical Q0600  . furosemide  40 mg Oral BID  . hydrALAZINE  25 mg Oral BID  . influenza vaccine adjuvanted  0.5 mL Intramuscular Tomorrow-1000  . insulin aspart  0-6 Units Subcutaneous TID WC  . insulin aspart  2 Units Subcutaneous TID WC  . levothyroxine  125 mcg Oral QAC breakfast  . loratadine  10 mg Oral Daily  . multivitamin  1 tablet Oral Daily  .  omega-3 acid ethyl esters  2 g Oral BID  . patiromer  25.2 g Oral Daily  . pneumococcal 23 valent vaccine  0.5 mL Intramuscular Tomorrow-1000  . predniSONE  60 mg Oral Q breakfast  . zinc sulfate  220 mg Oral Daily   sodium chloride, sodium chloride, acetaminophen, alteplase, guaiFENesin-dextromethorphan, heparin, lidocaine (PF), lidocaine-prilocaine, lidocaine-prilocaine, ondansetron **OR** ondansetron (ZOFRAN) IV, pentafluoroprop-tetrafluoroeth, polyvinyl alcohol  Assessment/ Plan:  Mr. Alejandro Armstrong is a 86 y.o. white male with end stage renal disease on hemodialysis, hypertension, diabetes mellitus type II, hypothyroidism, idiopathic thormbocytopenia purpura, and nephrolithiasis who was admitted to [facility] on .04/12/2020  For Hyperkalemia [E87.5] ESRD (end stage renal disease) on dialysis ([place]) [N18.6, Z99.2] Generalized weakness [R53.1] COVID [U07.1]  [facility] [place] Left AVF 73 kg  1. End stage renal disease with hyperkalemia: last hemodialysis treatment was Thursday.  Patient is waiting for vascular intervention today Plan to dialyze him today,after access is established.  2. Anemia of chronic kidney disease Hemoglobin today is 9.5   We will continue Epogen with dialysis  3. Hypertension:  Normotensive today Will continue current antihypertensive regimen  4. Secondary Hyperparathyroidism: Phosphorus 6.8 on 04/15/20 Calcium  8.7    LOS: 4 Alejandro Armstrong 11/1/202111:43 AM

## 2020-04-16 NOTE — Op Note (Signed)
OPERATIVE NOTE    PRE-OPERATIVE DIAGNOSIS: 1. ESRD 2. Covid infection  POST-OPERATIVE DIAGNOSIS: same as above  PROCEDURE: 1. Ultrasound guidance for vascular access to the right internal jugular vein 2. Fluoroscopic guidance for placement of catheter 3. Placement of a 19 cm tip to cuff tunneled hemodialysis catheter via the right internal jugular vein  SURGEON: Leotis Pain, MD  ANESTHESIA:  Local with Moderate conscious sedation for approximately 23 minutes using 1 mg of Versed and 50 mcg of Fentanyl  ESTIMATED BLOOD LOSS: 5 cc  FLUORO TIME: less than one minute  CONTRAST: none  FINDING(S): 1.  Patent right internal jugular vein  SPECIMEN(S):  None  INDICATIONS:   Alejandro Armstrong is a 86 y.o.male who presents with renal failure and a clotted access.  His K is too high and is prohibitive for attempt at thrombectomy of his access.  The patient needs long term dialysis access for their ESRD, and a Permcath is necessary.  Risks and benefits are discussed and informed consent is obtained.    DESCRIPTION: After obtaining full informed written consent, the patient was brought back to the vascular suited. The patient's right neck and chest were sterilely prepped and draped in a sterile surgical field was created. Moderate conscious sedation was administered during a face to face encounter with the patient throughout the procedure with my supervision of the RN administering medicines and monitoring the patient's vital signs, pulse oximetry, telemetry and mental status throughout from the start of the procedure until the patient was taken to the recovery room.  The right internal jugular vein was visualized with ultrasound and found to be patent. It was then accessed under direct ultrasound guidance and a permanent image was recorded. A wire was placed. After skin nick and dilatation, the peel-away sheath was placed over the wire. I then turned my attention to an area under the clavicle.  Approximately 1-2 fingerbreadths below the clavicle a small counterincision was created and tunneled from the subclavicular incision to the access site. Using fluoroscopic guidance, a 19 centimeter tip to cuff tunneled hemodialysis catheter was selected, and tunneled from the subclavicular incision to the access site. It was then placed through the peel-away sheath and the peel-away sheath was removed. Using fluoroscopic guidance the catheter tips were parked in the right atrium. The appropriate distal connectors were placed. It withdrew blood well and flushed easily with heparinized saline and a concentrated heparin solution was then placed. It was secured to the chest wall with 2 Prolene sutures. The access incision was closed single 4-0 Monocryl. A 4-0 Monocryl pursestring suture was placed around the exit site. Sterile dressings were placed. The patient tolerated the procedure well and was taken to the recovery room in stable condition.  COMPLICATIONS: None  CONDITION: Stable  Leotis Pain, MD 04/16/2020 4:04 PM   This note was created with Dragon Medical transcription system. Any errors in dictation are purely unintentional.

## 2020-04-16 NOTE — Progress Notes (Addendum)
Patient demanding to eat prior to dialysis being started. 1 C staff made aware of patients demands of food, staff states understanding. Tray received in <63min from the request. Patient allowed to eat dinner prior to HD treatment while writer completed the set up dialysis machine. HD started without any difficulties. Patient remained agitated and irritable during treatment, with writer reiterating the importance of continuing to keep V/S equipment (bp cuff, O2 monitor) in place during dialysis treatment and leaving HD accessible to RN view.   1900: patient becoming increasingly agitated and will not keep blood pressure cuff on and is pulling at his access site. Dr. Juleen China made aware. Primary RN made aware of patients agitation and the need for something to calm patient down to continue treatment. Writer unable to keep the cuff on the patient to obtain a blood pressure at 1900. Writer talking with patient on the importance of obtaining v/s during treatment. Patient yelling at this time towards writer as she continues to prevent patient from pulling at lines. BP obtained at 1932p. 2000pm: pt continuing to pull at his lines and remains agitated, yelling at [place], who continues to try and obtain v/s with patient pulling at lines and covers. 26 minutes remaining of treatment and treatment d/ced at this time. Dr. Juleen China made aware of patient continued agitation.

## 2020-04-16 NOTE — Progress Notes (Signed)
Consent for vascular procedure signed by patient.

## 2020-04-16 NOTE — Progress Notes (Signed)
Patient taken to vascular by transport in stable condition.

## 2020-04-16 NOTE — Progress Notes (Signed)
Inpatient Diabetes Program Recommendations  AACE/ADA: New Consensus Statement on Inpatient Glycemic Control   Target Ranges:  Prepandial:   less than 140 mg/dL      Peak postprandial:   less than 180 mg/dL (1-2 hours)      Critically ill patients:  140 - 180 mg/dL   Results for Alejandro Armstrong, Alejandro Armstrong (MRN [id number]) as of 04/16/2020 09:22  Ref. Range 04/15/2020 08:30 04/15/2020 11:18 04/15/2020 16:26 04/15/2020 21:34  Glucose-Capillary Latest Ref Range: 70 - 99 mg/dL 282 (H) 334 (H) 224 (H) 248 (H)  Results for Alejandro Armstrong, Alejandro Armstrong (MRN [id number]) as of 04/16/2020 09:22  Ref. Range 04/14/2020 23:56  Hemoglobin A1C Latest Ref Range: 4.8 - 5.6 % 6.9 (H)   Review of Glycemic Control  Diabetes history: DM2 Outpatient Diabetes medications: None; diet controlled Current orders for Inpatient glycemic control: Novolog 0-9 units TID with meals, Novolog 2 units TID with meals; Prednisone 60 mg daily  Inpatient Diabetes Program Recommendations:    Insulin: If steroids are continued, please consider ordering Lantus 5 units Q24H.  Thanks, Barnie Alderman, RN, MSN, CDE Diabetes Coordinator Inpatient Diabetes Program [phone] (Team Pager from 8am to 5pm)

## 2020-04-16 NOTE — Interval H&P Note (Signed)
History and Physical Interval Note:  04/16/2020 3:19 PM  Alejandro Armstrong  has presented today for surgery, with the diagnosis of renal failure, clotted access.  The various methods of treatment have been discussed with the patient and family. After consideration of risks, benefits and other options for treatment, the patient has consented to  Procedure(s): PERIPHERAL VASCULAR THROMBECTOMY, possible permcath placement ([name]) as a surgical intervention.  The patient's history has been reviewed, patient examined, no change in status, stable for surgery.  I have reviewed the patient's chart and labs.  Questions were answered to the patient's satisfaction.     Leotis Pain

## 2020-04-16 NOTE — Progress Notes (Signed)
Patient refusing bed alarm. Patient aware of risks associated with getting up unassisted. Patient states "just turn that thing off." Floor mats placed.

## 2020-04-16 NOTE — Progress Notes (Signed)
Hemodialysis patient known at [facility], [place], MWF 9:50am. Patient either self transports or wife does. Due to recent Covid results, patient will need to transfer to [place] shift TTS 9:00am. Patient or wife will need to continue transporting. Please contact me with any other dialysis placement concerns.  Elvera Bicker Dialysis Coordinator [phone]

## 2020-04-16 NOTE — Progress Notes (Signed)
PROGRESS NOTE    Alejandro Armstrong  UXN:[id number] DOB: May 14, 1930 DOA: 04/12/2020 PCP: Casilda Carls, MD    Chief Complaint  Patient presents with  . Weakness  . Fatigue    Brief Narrative:  Alejandro Armstrong  is a 86 y.o. male with a known history of end-stage renal disease has not had dialysis since Friday.  He has not been feeling well.  He has been feeling tired and fatigued. His wife recently had Covid.  He has not felt well enough to go to dialysis on Monday or Wednesday.  he was  Tested Covid positive on 10/27.  [name].  Slight shortness of breath.  In the ER, he was found to be hyperkalemic with a potassium of 6.8.  Hospitalist services were contacted for further evaluation  Subjective: Patient very hard of hearing.  Denies any shortness of breath or chest pain this morning.  No nausea vomiting.  Complaining about having to be empty stomach.  Assessment & Plan:   Pneumonia due to COVID-19/acute respiratory failure with hypoxia, resolved Chest x-ray showed left lung base infiltrate.  He was initially requiring 2 L of oxygen.  Has been weaned down to room air.  He received 3 doses of Remdesivir and steroids were also given.  Remdesivir had to be discontinued as he lost IV access.  Patient is a dialysis patient with very poor venous access.  Since he has had otherwise a clinical improvement with respect to his COVID-19 no need to resume it.  Continue with oral steroids.  CRP was down to 0.6.  D-dimer was also improving.  ESRD on HD MWF, anemia of chronic disease Patient being followed by nephrology.  He presented initially with significant hyperkalemia due to having missed dialysis sessions.  There has been some issue with his fistula.  Appears to have clotted off.  Plan is for declotting procedure today. Potassium noted to be 6.0 this morning.  Management per nephrology.    Hemoglobin is stable.  No evidence of overt blood loss  History of  diabetes mellitus type 2, diet controlled  HbA1c 6.9.  Elevated CBGs most likely due to steroids.  Continue SSI.  CBG should improve as steroid is tapered down.  Hold off on basal insulin for now.  Essential hypertension Continue amlodipine, hydralazine, furosemide  Hypothyroidism Continue home medications.  Hyperlipidemia Continue Lipitor.  DVT prophylaxis: SCDs Code Status:DNR Family Communication: We will update his wife later today Disposition:   Status is: Inpatient  Dispo: The patient is from: home              Anticipated d/c is to: TBD              Anticipated d/c date is: TBD                Consultants:   Nephrology  Vascular surgery  Procedures:   dialysis     Objective: Vitals:   04/15/20 1950 04/16/20 0028 04/16/20 0340 04/16/20 0738  BP: 136/68 129/67 (!) 147/65 126/75  Pulse: 62 62 64 (!) 58  Resp: 18 16 18 18   Temp: 97.7 F (36.5 C) (!) 97.2 F (36.2 C) (!) 97.3 F (36.3 C) (!) 97.2 F (36.2 C)  TempSrc: Oral  Oral Oral  SpO2: 99% 96% 100% 97%  Weight:      Height:        Intake/Output Summary (Last 24 hours) at 04/16/2020 1153 Last data filed at 04/16/2020 2542 Gross  per 24 hour  Intake --  Output 1300 ml  Net -1300 ml   Filed Weights   04/12/20 1441  Weight: 72.6 kg    Examination:  General appearance: Awake alert.  In no distress Resp: Normal effort at rest.  Few crackles bilateral bases.  No wheezing or rhonchi. Cardio: S1-S2 is normal regular.  No S3-S4.  No rubs murmurs or bruit GI: Abdomen is soft.  Nontender nondistended.  Bowel sounds are present normal.  No masses organomegaly Extremities: No edema.  Full range of motion of lower extremities. Neurologic:No focal neurological deficits.      Data Reviewed: I have personally reviewed following labs and imaging studies  CBC: Recent Labs  Lab 04/12/20 1449 04/13/20 0646 04/14/20 0432 04/14/20 2356 04/16/20 0856  WBC 6.2 2.8* 4.8 5.7 6.0  NEUTROABS  --  2.2 3.4  4.5 4.6  HGB 11.2* 9.7* 9.7* 8.9* 9.5*  HCT 33.9* 29.0* 30.4* 26.2* 28.8*  MCV 99.4 98.3 102.7* 97.8 98.3  PLT 175 130* 157 166 580    Basic Metabolic Panel: Recent Labs  Lab 04/12/20 1449 04/12/20 1740 04/13/20 0646 04/14/20 0432 04/14/20 2356 04/16/20 0856  NA 140  --  139 138 134* 132*  K 6.8*  --  5.6* 5.5* 5.6* 6.0*  CL 110  --  104 106 103 101  CO2 17*  --  20* 17* 17* 17*  GLUCOSE 198*  --  283* 239* 202* 217*  BUN 68*  --  54* 64* 80* 102*  CREATININE 12.18*  --  9.75* 10.43* 10.49* 10.83*  CALCIUM 9.6  --  9.3 9.4 9.0 8.7*  PHOS  --  4.1  --   --  6.8*  --     GFR: Estimated Creatinine Clearance: 4.5 mL/min (A) (by C-G formula based on SCr of 10.83 mg/dL (H)).  Liver Function Tests: Recent Labs  Lab 04/13/20 0646 04/14/20 0432 04/14/20 2356 04/16/20 0856  AST 21 30 28 22   ALT 13 15 16 20   ALKPHOS 84 81 69 78  BILITOT 0.9 0.8 0.7 1.0  PROT 7.5 7.3 6.5 6.9  ALBUMIN 3.6 3.6 3.3* 3.5    CBG: Recent Labs  Lab 04/15/20 0830 04/15/20 1118 04/15/20 1626 04/15/20 2134 04/16/20 1141  GLUCAP 282* 334* 224* 248* 214*     Recent Results (from the past 240 hour(s))  Respiratory Panel by RT PCR (Flu A&B, Covid) - Nasopharyngeal Swab     Status: Abnormal   Collection Time: 04/12/20  3:56 PM   Specimen: Nasopharyngeal Swab  Result Value Ref Range Status   SARS Coronavirus 2 by RT PCR POSITIVE (A) NEGATIVE Final    Comment: RESULT CALLED TO, READ BACK BY AND VERIFIED WITH: [facility] REGISTER AT 9983 04/12/2020 BY MOSLEY,J (NOTE) SARS-CoV-2 target nucleic acids are DETECTED.  SARS-CoV-2 RNA is generally detectable in upper respiratory specimens  during the acute phase of infection. Positive results are indicative of the presence of the identified virus, but do not rule out bacterial infection or co-infection with other pathogens not detected by the test. Clinical correlation with patient history and other diagnostic information is necessary to determine  patient infection status. The expected result is Negative.  Fact Sheet for Patients:  PinkCheek.be  Fact Sheet for Healthcare Providers: GravelBags.it  This test is not yet approved or cleared by the [place] FDA and  has been authorized for detection and/or diagnosis of SARS-CoV-2 by FDA under an Emergency Use Authorization (EUA).  This EUA will remain in effect (  meaning this te st can be used) for the duration of  the COVID-19 declaration under Section 564(b)(1) of the Act, 21 U.S.C. section 360bbb-3(b)(1), unless the authorization is terminated or revoked sooner.      Influenza A by PCR NEGATIVE NEGATIVE Final   Influenza B by PCR NEGATIVE NEGATIVE Final    Comment: (NOTE) The Xpert Xpress SARS-CoV-2/FLU/RSV assay is intended as an aid in  the diagnosis of influenza from Nasopharyngeal swab specimens and  should not be used as a sole basis for treatment. Nasal washings and  aspirates are unacceptable for Xpert Xpress SARS-CoV-2/FLU/RSV  testing.  Fact Sheet for Patients: PinkCheek.be  Fact Sheet for Healthcare Providers: GravelBags.it  This test is not yet approved or cleared by the [place] FDA and  has been authorized for detection and/or diagnosis of SARS-CoV-2 by  FDA under an Emergency Use Authorization (EUA). This EUA will remain  in effect (meaning this test can be used) for the duration of the  Covid-19 declaration under Section 564(b)(1) of the Act, 21  U.S.C. section 360bbb-3(b)(1), unless the authorization is  terminated or revoked. Performed at [facility], [address] [place], [place] [address]          Radiology Studies: No results found.      Scheduled Meds: . albuterol  2 puff Inhalation Q6H  . amLODipine  5 mg Oral Daily  . vitamin C  500 mg Oral Daily  . aspirin EC  81 mg Oral Daily  . atorvastatin   10 mg Oral Daily  . Chlorhexidine Gluconate Cloth  6 each Topical Q0600  . furosemide  40 mg Oral BID  . hydrALAZINE  25 mg Oral BID  . influenza vaccine adjuvanted  0.5 mL Intramuscular Tomorrow-1000  . insulin aspart  0-6 Units Subcutaneous TID WC  . insulin aspart  2 Units Subcutaneous TID WC  . levothyroxine  125 mcg Oral QAC breakfast  . loratadine  10 mg Oral Daily  . multivitamin  1 tablet Oral Daily  . omega-3 acid ethyl esters  2 g Oral BID  . patiromer  25.2 g Oral Daily  . pneumococcal 23 valent vaccine  0.5 mL Intramuscular Tomorrow-1000  . predniSONE  60 mg Oral Q breakfast  . zinc sulfate  220 mg Oral Daily   Continuous Infusions: . sodium chloride    . sodium chloride    . sodium chloride    .  ceFAZolin (ANCEF) IV    . remdesivir 100 mg in NS 100 mL Stopped (04/13/20 1241)     LOS: 4 days   [facility]   To page the attending provider between 7A-7P or the covering provider during after hours 7P-7A, please log into the web site [email or web address] and access using universal [place] password for that web site. If you do not have the password, please call the hospital operator.    04/16/2020, 11:53 AM

## 2020-04-17 ENCOUNTER — Encounter: Payer: Self-pay | Admitting: Vascular Surgery

## 2020-04-17 ENCOUNTER — Inpatient Hospital Stay
Admit: 2020-04-17 | Discharge: 2020-04-17 | Disposition: A | Discharge status: Home / Self Care | Payer: Medicare HMO | Attending: Internal Medicine | Admitting: Internal Medicine

## 2020-04-17 DIAGNOSIS — I480 Paroxysmal atrial fibrillation: Secondary | ICD-10-CM

## 2020-04-17 LAB — COMPREHENSIVE METABOLIC PANEL
ALT: 22 U/L (ref 0–44)
AST: 23 U/L (ref 15–41)
Albumin: 3 g/dL — ABNORMAL LOW (ref 3.5–5.0)
Alkaline Phosphatase: 63 U/L (ref 38–126)
Anion gap: 13 (ref 5–15)
BUN: 57 mg/dL — ABNORMAL HIGH (ref 8–23)
CO2: 23 mmol/L (ref 22–32)
Calcium: 8.5 mg/dL — ABNORMAL LOW (ref 8.9–10.3)
Chloride: 100 mmol/L (ref 98–111)
Creatinine, Ser: 6.9 mg/dL — ABNORMAL HIGH (ref 0.61–1.24)
GFR, Estimated: 7 mL/min — ABNORMAL LOW (ref >60)
Glucose, Bld: 202 mg/dL — ABNORMAL HIGH (ref 70–99)
Potassium: 4.6 mmol/L (ref 3.5–5.1)
Sodium: 136 mmol/L (ref 135–145)
Total Bilirubin: 0.8 mg/dL (ref 0.3–1.2)
Total Protein: 6.1 g/dL — ABNORMAL LOW (ref 6.5–8.1)

## 2020-04-17 LAB — CBC WITH DIFFERENTIAL/PLATELET
Abs Immature Granulocytes: 0.04 10*3/uL (ref 0.00–0.07)
Basophils Absolute: 0 10*3/uL (ref 0.0–0.1)
Basophils Relative: 0 %
Eosinophils Absolute: 0 10*3/uL (ref 0.0–0.5)
Eosinophils Relative: 0 %
HCT: 26.6 % — ABNORMAL LOW (ref 39.0–52.0)
Hemoglobin: 9.1 g/dL — ABNORMAL LOW (ref 13.0–17.0)
Immature Granulocytes: 1 %
Lymphocytes Relative: 17 %
Lymphs Abs: 1.2 10*3/uL (ref 0.7–4.0)
MCH: 33.1 pg (ref 26.0–34.0)
MCHC: 34.2 g/dL (ref 30.0–36.0)
MCV: 96.7 fL (ref 80.0–100.0)
Monocytes Absolute: 0.8 10*3/uL (ref 0.1–1.0)
Monocytes Relative: 11 %
Neutro Abs: 5.1 10*3/uL (ref 1.7–7.7)
Neutrophils Relative %: 71 %
Platelets: 158 10*3/uL (ref 150–400)
RBC: 2.75 MIL/uL — ABNORMAL LOW (ref 4.22–5.81)
RDW: 14.6 % (ref 11.5–15.5)
WBC: 7.2 10*3/uL (ref 4.0–10.5)
nRBC: 0 % (ref 0.0–0.2)

## 2020-04-17 LAB — GLUCOSE, CAPILLARY
Glucose-Capillary: 181 mg/dL — ABNORMAL HIGH (ref 70–99)
Glucose-Capillary: 150 mg/dL — ABNORMAL HIGH (ref 70–99)
Glucose-Capillary: 183 mg/dL — ABNORMAL HIGH (ref 70–99)
Glucose-Capillary: 229 mg/dL — ABNORMAL HIGH (ref 70–99)

## 2020-04-17 LAB — ECHOCARDIOGRAM COMPLETE
AR max vel: 2.73 cm2
AV Area VTI: 2.46 cm2
AV Area mean vel: 2.62 cm2
AV Mean grad: 5 mmHg
AV Peak grad: 9.7 mmHg
Ao pk vel: 1.56 m/s
Area-P 1/2: 2.95 cm2
Height: 67.5 in
S' Lateral: 2.72 cm
Weight: 2560 oz

## 2020-04-17 MED ORDER — EPOETIN ALFA 10000 UNIT/ML IJ SOLN
10000.0000 [IU] | INTRAMUSCULAR | Status: DC
  Administered 2020-04-17 – 2020-04-20 (×2): 10000 [IU] via INTRAVENOUS
  Filled 2020-04-17 (×3): qty 1

## 2020-04-17 MED ORDER — POLYETHYLENE GLYCOL 3350 17 G PO PACK
17.0000 g | PACK | Freq: Every day | ORAL | Status: DC
  Administered 2020-04-17 – 2020-04-20 (×4): 17 g via ORAL
  Filled 2020-04-17 (×4): qty 1

## 2020-04-17 MED ORDER — DILTIAZEM HCL 25 MG/5ML IV SOLN
5.0000 mg | Freq: Once | INTRAVENOUS | Status: AC
  Administered 2020-04-17: 5 mg via INTRAVENOUS
  Filled 2020-04-17: qty 5

## 2020-04-17 MED ORDER — METOPROLOL TARTRATE 25 MG PO TABS
12.5000 mg | ORAL_TABLET | Freq: Two times a day (BID) | ORAL | Status: DC
  Administered 2020-04-17 – 2020-04-20 (×6): 12.5 mg via ORAL
  Filled 2020-04-17 (×6): qty 1

## 2020-04-17 MED ORDER — METOPROLOL TARTRATE 5 MG/5ML IV SOLN: 5.0000 mg | Freq: Once | INTRAVENOUS | Status: DC

## 2020-04-17 MED ORDER — CALCIUM ACETATE (PHOS BINDER) 667 MG PO CAPS
1334.0000 mg | ORAL_CAPSULE | Freq: Three times a day (TID) | ORAL | Status: DC
  Administered 2020-04-17 – 2020-04-20 (×10): 1334 mg via ORAL
  Filled 2020-04-17 (×11): qty 2

## 2020-04-17 NOTE — Plan of Care (Signed)
Pt had perm cath placed yesterday for dialysis MWF.  Had dialysis yesterday.  Unable to complete b/c of pt's agitated state.

## 2020-04-17 NOTE — Plan of Care (Signed)
With shift assessment, it was noted that pt had irregular, elevated HR.  This doesn't seem to be a part of his medical hx.  Pt had dialysis today, but had to cut off early b/c of pt's agitation.  He was very irritable about room temp, but got him some warm blankets and he settled down.  Pt continued to have elevated HR.  Discussed w/Elizabeth and requested tele order and something to help him rest. Seroquel order.  Pt was in Afib 130's-150s. He also had a 6 beat run of Vtach.  EKG done which showed Afib w/RVR.  5 mg IV cardizem given.  Pt stated "something's not right. I think I'm going to die."  In 20 minutes, pt turned on L side and HR came down to 87, and he came out of Afib.

## 2020-04-17 NOTE — Progress Notes (Signed)
*  PRELIMINARY RESULTS* Echocardiogram 2D Echocardiogram has been performed.  Alejandro Armstrong 04/17/2020, 1:03 PM

## 2020-04-17 NOTE — Consult Note (Signed)
[facility] Consultation Note  Patient ID: Alejandro Armstrong, MRN: [id number], DOB/AGE: 1930-02-27 86 y.o. Admit date: 04/12/2020   Date of Consult: 04/17/2020 Primary Physician: Casilda Carls, MD Primary Cardiologist: None  Chief Complaint:  Chief Complaint  Patient presents with  . Weakness  . Fatigue   Reason for Consult: Atrial fibrillation  HPI: 86 y.o. male with known diabetes hypertension hyperlipidemia chronic kidney disease status post dialysis with fistula having a recent respiratory disc function and difficulty after contracting Covid from his wife.  May patient came to the emergency room with shortness of breath found to be hyperkalemic and denying any chest pain PND orthopnea syncope dizziness nausea or diaphoresis.  With some apparent hypoxia shortness of breath and weakness from his Covid infection the patient had an episode of atrial fibrillation with rapid ventricular rate which has spontaneously converted back to normal sinus rhythm after diltiazem drip.  Now the patient is currently on metoprolol at low dose maintaining normal sinus rhythm at a slower heart rate of 52 bpm.  The patient has tolerated other antihypertensive medication management and is otherwise comfortable at this time.  Other contributing factors is chronic kidney disease on dialysis, anemia with a hemoglobin of 9.1.  The patient did have an echocardiogram showing normal LV systolic function ejection fraction of 50% with no evidence of significant valvular heart disease or changes concerning for cardiovascular causes.  Past Medical History:  Diagnosis Date  . Arthritis   . Dialysis patient [facility])    Mon, Wed Fri  . Hyperlipidemia   . Hypertension   . Hypothyroidism   . Idiopathic thrombocytopenia purpura ([place])   . Kidney stones   . Recurrent UTI   . Renal agenesis    discovered at age 61  . Type 2 diabetes mellitus ([place])   . Ureteral stricture       Surgical History:  Past  Surgical History:  Procedure Laterality Date  . A/V FISTULAGRAM Left 03/31/2017   Procedure: A/V Fistulagram;  Surgeon: [name], MD;  Location: [place] [facility];  Service: Cardiovascular;  Laterality: Left;  . A/V FISTULAGRAM Left 09/07/2018   Procedure: A/V FISTULAGRAM;  Surgeon: [name], MD;  Location: [place] [facility];  Service: Cardiovascular;  Laterality: Left;  . A/V SHUNTOGRAM Left 07/28/2017   Procedure: A/V SHUNTOGRAM;  Surgeon: [name], MD;  Location: [place] [facility];  Service: Cardiovascular;  Laterality: Left;  . A/V SHUNTOGRAM Left 03/24/2018   Procedure: A/V SHUNTOGRAM;  Surgeon: [name], MD;  Location: [place] [facility];  Service: Cardiovascular;  Laterality: Left;  . APPENDECTOMY    . AV FISTULA PLACEMENT Left 04/16/2016   Procedure: INSERTION OF ARTERIOVENOUS (AV) GORE-TEX GRAFT ARM;  Surgeon: [name], MD;  Location: [facility];  Service: Vascular;  Laterality: Left;  . CATARACT EXTRACTION W/ INTRAOCULAR LENS IMPLANT Bilateral 2014   done 1-2 months apart at [facility].  [name] FINGER SURGERY Left 2002   pinky finger  . KIDNEY STONE SURGERY    . KYPHOPLASTY [name] 09/16/2018   Procedure: KYPHOPLASTY L3, DIABETIC;  Surgeon: Hessie Knows, MD;  Location: [facility];  Service: Orthopedics;  Laterality: [name];  . NASAL SINUS SURGERY  1985  . PERIPHERAL VASCULAR CATHETERIZATION  05/13/2016   Procedure: Upper Extremity Angiography;  Surgeon: [name], MD;  Location: [place] [facility];  Service: Cardiovascular;;  . PERIPHERAL VASCULAR CATHETERIZATION  05/13/2016   Procedure: Upper Extremity Intervention;  Surgeon: [name],  MD;  Location: [place] [facility];  Service: Cardiovascular;;  . PERIPHERAL VASCULAR THROMBECTOMY [name] 04/16/2020   Procedure: PERIPHERAL VASCULAR THROMBECTOMY, possible permcath placement;  Surgeon: Algernon Huxley, MD;  Location: [place] [facility];  Service: Cardiovascular;  Laterality: [name];  .  PICC LINE PLACE PERIPHERAL ([place] HX) Right 08/2015  . PICC LINE REMOVAL ([place] HX) Right 09/2015     Home Meds: Prior to Admission medications   Medication Sig Start Date End Date Taking? Authorizing Provider  acetaminophen (TYLENOL) 500 MG tablet Take 1,000 mg by mouth every 6 (six) hours as needed for moderate pain or headache.    Yes [provider]  amLODipine (NORVASC) 5 MG tablet Take 5 mg by mouth daily.   Yes [provider]  aspirin EC 81 MG EC tablet Take 1 tablet (81 mg total) by mouth daily. 11/05/19  Yes Lavina Hamman, MD  atorvastatin (LIPITOR) 10 MG tablet Take 10 mg by mouth daily.   Yes [provider]  B Complex-C-Folic Acid (RENA-VITE PO) Take 1 tablet by mouth daily.   Yes [provider]  Carboxymethylcellulose Sodium (THERATEARS) 0.25 % SOLN Place 1-2 drops into both eyes 3 (three) times daily as needed (for dry eyes).   Yes [provider]  cetirizine (ZYRTEC) 10 MG tablet Take 10 mg by mouth daily.  09/09/18  Yes [provider]  Cholecalciferol (VITAMIN D3) 1.25 MG (50000 UT) CAPS Take 50,000 Units by mouth every Wednesday.    Yes [provider]  furosemide (LASIX) 40 MG tablet Take 40 mg by mouth 2 (two) times daily.    Yes [provider]  hydrALAZINE (APRESOLINE) 25 MG tablet Take 25 mg by mouth 2 (two) times daily.   Yes [provider]  levothyroxine (SYNTHROID) 125 MCG tablet Take 125 mcg by mouth daily before breakfast.    Yes [provider]  lidocaine-prilocaine (EMLA) cream Apply 1 application topically daily as needed (port access).  10/03/16  Yes [provider]  lisinopril (PRINIVIL,ZESTRIL) 20 MG tablet Take 20 mg by mouth daily.  06/24/12  Yes [provider]  Omega-3 1000 MG CAPS Take 2,000 mg by mouth 2 (two) times daily.    Yes [provider]    Inpatient Medications:  . albuterol  2 puff Inhalation Q6H  . vitamin C  500 mg Oral Daily   . aspirin EC  81 mg Oral Daily  . atorvastatin  10 mg Oral Daily  . calcium acetate  1,334 mg Oral TID WC  . Chlorhexidine Gluconate Cloth  6 each Topical Q0600  . epoetin (EPOGEN/PROCRIT) injection  10,000 Units Intravenous Q T,Th,Sa-HD  . heparin  5,000 Units Subcutaneous Q8H  . insulin aspart  0-6 Units Subcutaneous TID WC  . insulin aspart  2 Units Subcutaneous TID WC  . levothyroxine  125 mcg Oral QAC breakfast  . loratadine  10 mg Oral Daily  . metoprolol tartrate  12.5 mg Oral BID  . multivitamin  1 tablet Oral Daily  . omega-3 acid ethyl esters  2 g Oral BID  . patiromer  25.2 g Oral Daily  . polyethylene glycol  17 g Oral Daily  . predniSONE  40 mg Oral Q breakfast  . zinc sulfate  220 mg Oral Daily   . sodium chloride    . sodium chloride      Allergies: No Known Allergies  Social History   Socioeconomic History  . Marital status: Married    Spouse  name: Not on file  . Number of children: Not on file  . Years of education: Not on file  . Highest education level: Not on file  Occupational History  . Occupation: retired  Tobacco Use  . Smoking status: Never Smoker  . Smokeless tobacco: Never Used  Vaping Use  . Vaping Use: Never used  Substance and Sexual Activity  . Alcohol use: No    Alcohol/week: 0.0 standard drinks  . Drug use: No  . Sexual activity: Never  Other Topics Concern  . Not on file  Social History Narrative  . Not on file   Social Determinants of Health   Financial Resource Strain:   . Difficulty of Paying Living Expenses: Not on file  Food Insecurity:   . Worried About Charity fundraiser in the Last Year: Not on file  . Ran Out of Food in the Last Year: Not on file  Transportation Needs:   . Lack of Transportation (Medical): Not on file  . Lack of Transportation (Non-Medical): Not on file  Physical Activity:   . Days of Exercise per Week: Not on file  . Minutes of Exercise per Session: Not on file  Stress:   . Feeling of Stress  : Not on file  Social Connections:   . Frequency of Communication with Friends and Family: Not on file  . Frequency of Social Gatherings with Friends and Family: Not on file  . Attends Religious Services: Not on file  . Active Member of Clubs or Organizations: Not on file  . Attends Archivist Meetings: Not on file  . Marital Status: Not on file  Intimate Partner Violence:   . Fear of Current or Ex-Partner: Not on file  . Emotionally Abused: Not on file  . Physically Abused: Not on file  . Sexually Abused: Not on file     Family History  Problem Relation Age of Onset  . Cervical cancer Sister   . CAD Father      Review of Systems Positive for shortness of breath cough congestion Negative for: General:  chills, fever, night sweats or weight changes.  Cardiovascular: PND orthopnea syncope dizziness  Dermatological skin lesions rashes Respiratory: Positive for cough congestion Urologic: Frequent urination urination at night and hematuria Abdominal: negative for nausea, vomiting, diarrhea, bright red blood per rectum, melena, or hematemesis Neurologic: negative for visual changes, and/or hearing changes  All other systems reviewed and are otherwise negative except as noted above.  Labs: No results for input(s): CKTOTAL, CKMB, TROPONINI in the last 72 hours. Lab Results  Component Value Date   WBC 7.2 04/17/2020   HGB 9.1 (L) 04/17/2020   HCT 26.6 (L) 04/17/2020   MCV 96.7 04/17/2020   PLT 158 04/17/2020    Recent Labs  Lab 04/17/20 0553  NA 136  K 4.6  CL 100  CO2 23  BUN 57*  CREATININE 6.90*  CALCIUM 8.5*  PROT 6.1*  BILITOT 0.8  ALKPHOS 63  ALT 22  AST 23  GLUCOSE 202*   No results found for: CHOL, HDL, LDLCALC, TRIG No results found for: DDIMER  Radiology/Studies:  PERIPHERAL VASCULAR CATHETERIZATION  Result Date: 04/16/2020 See op note  DG Chest Portable 1 View  Result Date: 04/12/2020 CLINICAL DATA:  COVID positive, ESRD. EXAM:  PORTABLE CHEST 1 VIEW COMPARISON:  Nov 04, 2019 FINDINGS: Stable cardiomediastinal silhouette. Subtle opacities in the left lateral lung base. No visible pleural effusions or pneumothorax. Hiatal hernia. Vascular stents in the left  subclavian left axillary regions. IMPRESSION: Subtle opacities in the left lateral lung base, which may represent early pneumonia given reported COVID diagnosis. Electronically Signed   By: Margaretha Sheffield MD   On: 04/12/2020 15:53   VAS [place] (AVF, AVG)  Result Date: 04/05/2020 DIALYSIS ACCESS Reason for Exam: Routine follow up. Access Site: Left Upper Extremity. Access Type: Brachial/Axial AVG. History: 09/07/2018 Central venous PTA stent, mid graft PTA          04/16/16: Left brachial-axillary AVG;          05/13/16: Left brachia & radial artery PTAs for steal;          03/31/17: Outflow vein PTA/stent;          03/24/18: PTA stricture of mid AVG          07/28/17: Peripheral segment PTA/stent with central venous PTA;. Comparison Study: 02/16/2020 Performing Technologist: Charlane Ferretti RT (R)(VS)  Examination Guidelines: A complete evaluation includes B-mode imaging, spectral Doppler, color Doppler, and power Doppler as needed of all accessible portions of each vessel. Unilateral testing is considered an integral part of a complete examination. Limited examinations for reoccurring indications may be performed as noted.  Findings:  +--------------------+----------+-----------------+----------------------------+ AVG                 PSV (cm/s)Flow Vol (mL/min)          Describe           +--------------------+----------+-----------------+----------------------------+ Native artery inflow   113          1046                                    +--------------------+----------+-----------------+----------------------------+ Arterial anastomosis   105                                                   +--------------------+----------+-----------------+----------------------------+ Prox graft             103                                                  +--------------------+----------+-----------------+----------------------------+ Mid graft              169                                                  +--------------------+----------+-----------------+----------------------------+ Distal graft           355                     change in diameter from .68                                                          cm -1.17cm          +--------------------+----------+-----------------+----------------------------+ Venous anastomosis  479                           Narrowing vessel       +--------------------+----------+-----------------+----------------------------+ Venous outflow         315                                                  +--------------------+----------+-----------------+----------------------------+ +---------------+-------------+---------+---------+----------+-----------------+                Diameter (cm)  Depth  BranchingPSV (cm/s)   Flow Volume                                  (cm)                          (ml/min)      +---------------+-------------+---------+---------+----------+-----------------+ Left Radial                                       53                      Artery                                                                    +---------------+-------------+---------+---------+----------+-----------------+ Summary: Patent arteriovenous graft. *See table(s) above for measurements and observations.  Diagnosing physician: Hortencia Pilar MD Electronically signed by Hortencia Pilar MD on 04/05/2020 at 5:27:04 PM.    --------------------------------------------------------------------------------   Final    ECHOCARDIOGRAM COMPLETE  Result Date: 04/17/2020    ECHOCARDIOGRAM REPORT   Patient Name:   Alejandro Armstrong  [facility] Date of Exam: 04/17/2020 Medical Rec #:  [id number]           Height:       67.5 in Accession #:    [id number]          Weight:       160.0 lb Date of Birth:  [birth year]           BSA:          1.849 m Patient Age:    4 years            BP:           123/58 mmHg Patient Gender: M                   HR:           56 bpm. Exam Location:  [facility] Procedure: 2D Echo, Color Doppler and Cardiac Doppler Indications:     I48.91 Atrial Fibrillation  History:         Patient has prior history of Echocardiogram examinations. Risk                  Factors:Hypertension, Diabetes and Dyslipidemia. Pt tested  positive for COVID-19 on 04/12/20.  Sonographer:     Charmayne Sheer RDCS (AE) Referring Phys:  [id number] Bonnielee Haff Diagnosing Phys: Serafina Royals MD  Sonographer Comments: Image acquisition challenging due to uncooperative patient. IMPRESSIONS  1. Left ventricular ejection fraction, by estimation, is 50 to 55%. The left ventricle has low normal function. The left ventricle has no regional wall motion abnormalities. Left ventricular diastolic parameters were normal.  2. Right ventricular systolic function is normal. The right ventricular size is normal.  3. The mitral valve is normal in structure. Mild mitral valve regurgitation.  4. The aortic valve is normal in structure. Aortic valve regurgitation is trivial. FINDINGS  Left Ventricle: Left ventricular ejection fraction, by estimation, is 50 to 55%. The left ventricle has low normal function. The left ventricle has no regional wall motion abnormalities. The left ventricular internal cavity size was normal in size. There is no left ventricular hypertrophy. Left ventricular diastolic parameters were normal. Right Ventricle: The right ventricular size is normal. No increase in right ventricular wall thickness. Right ventricular systolic function is normal. Left Atrium: Left atrial size was normal in size. Right Atrium: Right atrial size was normal in size.  Pericardium: There is no evidence of pericardial effusion. Mitral Valve: The mitral valve is normal in structure. Mild mitral valve regurgitation. MV peak gradient, 6.1 mmHg. The mean mitral valve gradient is 2.0 mmHg. Tricuspid Valve: The tricuspid valve is normal in structure. Tricuspid valve regurgitation is mild. Aortic Valve: The aortic valve is normal in structure. Aortic valve regurgitation is trivial. Aortic valve mean gradient measures 5.0 mmHg. Aortic valve peak gradient measures 9.7 mmHg. Aortic valve area, by VTI measures 2.46 cm. Pulmonic Valve: The pulmonic valve was normal in structure. Pulmonic valve regurgitation is not visualized. Aorta: The aortic root and ascending aorta are structurally normal, with no evidence of dilitation. IAS/Shunts: No atrial level shunt detected by color flow Doppler.  LEFT VENTRICLE PLAX 2D LVIDd:         5.06 cm  Diastology LVIDs:         2.72 cm  LV e' medial:    7.07 cm/s LV PW:         1.27 cm  LV E/e' medial:  11.2 LV IVS:        0.87 cm  LV e' lateral:   7.83 cm/s LVOT diam:     2.10 cm  LV E/e' lateral: 10.1 LV SV:         88 LV SV Index:   48 LVOT Area:     3.46 cm  RIGHT VENTRICLE RV Basal diam:  3.47 cm LEFT ATRIUM             Index LA diam:        4.80 cm 2.60 cm/m LA Vol (A2C):   41.6 ml 22.49 ml/m LA Vol (A4C):   67.2 ml 36.33 ml/m LA Biplane Vol: 56.4 ml 30.50 ml/m  AORTIC VALVE AV Area (Vmax):    2.73 cm AV Area (Vmean):   2.62 cm AV Area (VTI):     2.46 cm AV Vmax:           156.00 cm/s AV Vmean:          103.000 cm/s AV VTI:            0.358 m AV Peak Grad:      9.7 mmHg AV Mean Grad:      5.0 mmHg LVOT Vmax:  123.00 cm/s LVOT Vmean:        77.800 cm/s LVOT VTI:          0.254 m LVOT/AV VTI ratio: 0.71  AORTA Ao Root diam: 3.40 cm MITRAL VALVE MV Area (PHT): 2.95 cm     SHUNTS MV Peak grad:  6.1 mmHg     Systemic VTI:  0.25 m MV Mean grad:  2.0 mmHg     Systemic Diam: 2.10 cm MV Vmax:       1.23 m/s MV Vmean:      67.0 cm/s MV Decel Time:  257 msec MV E velocity: 79.10 cm/s MV A velocity: 107.00 cm/s MV E/A ratio:  0.74 Serafina Royals MD Electronically signed by Serafina Royals MD Signature Date/Time: 04/17/2020/1:49:14 PM    Final     EKG: Atrial fibrillation with rapid ventricular rate nonspecific ST changes  Weights: Filed Weights   04/12/20 1441  Weight: 72.6 kg     Physical Exam: Blood pressure (!) 121/56, pulse (!) 52, temperature 97.9 F (36.6 C), temperature source Oral, resp. rate 15, height 5' 7.5" (1.715 m), weight 72.6 kg, SpO2 97 %. Body mass index is 24.69 kg/m. General: Well developed, well nourished, in no acute distress. Head eyes ears nose throat: Normocephalic, atraumatic, sclera non-icteric, no xanthomas, nares are without discharge. No apparent thyromegaly and/or mass  Lungs: Normal respiratory effort.  Some wheezes, no rales, no rhonchi.  Heart: RRR with normal S1 S2. no murmur gallop, no rub, PMI is normal size and placement, carotid upstroke normal without bruit, jugular venous pressure is normal Abdomen: Soft, non-tender, non-distended with normoactive bowel sounds. No hepatomegaly. No rebound/guarding. No obvious abdominal masses. Abdominal aorta is normal size without bruit Extremities: Trace edema. no cyanosis, no clubbing, no ulcers with fistula Peripheral : 2+ bilateral upper extremity pulses, 2+ bilateral femoral pulses, 2+ bilateral dorsal pedal pulse Neuro: Alert and oriented. No facial asymmetry. No focal deficit. Moves all extremities spontaneously. Musculoskeletal: Normal muscle tone without kyphosis Psych:  Responds to questions appropriately with a normal affect.    Assessment: 86 year old male with diabetes hypertension hyperlipidemia dialysis having acute Covid infection receiving appropriate treatment although with infection has had episode of atrial fibrillation with rapid ventricular rate now spontaneously converted to normal sinus rhythm with mild bradycardia and no current  evidence of myocardial infarction  Plan: 1.  Continue low-dose of beta-blocker watching closely for any further concerns of bradycardia although would continue current dose of 12.5 mg twice per day 2.  Would consider anticoagulation if patient has recurrence of atrial fibrillation although with less than 24 hours of atrial fibrillation can avoid this.  Would continue enoxaparin for Covid infection which may lower risk of deep venous thrombosis and other thrombosis 3.  No further diagnostic testing and/or treatment options today due to no evidence of significant dementia myocardial infarction or congestive heart failure 4.  Okay for patient to have dialysis without restriction and continued medication management for Covid infection  Signed, Corey Skains M.D. [facility] 04/17/2020, 3:18 PM

## 2020-04-17 NOTE — Progress Notes (Signed)
OT Cancellation Note  Patient Details Name: Alejandro Armstrong MRN: [id number] DOB: 08-28-1929   Cancelled Treatment:    Reason Eval/Treat Not Completed: Patient at procedure or test/ unavailable. Per RN, pt currently receiving dialysis in his room. Will follow and initiate services at later date/time as able.   Dessie Coma, M.S. OTR/L  04/17/20, 3:26 PM  ascom [phone]

## 2020-04-17 NOTE — Progress Notes (Signed)
PT Cancellation Note  Patient Details Name: [name] MRN: [id number] DOB: [birth year]   Cancelled Treatment:    Reason Eval/Treat Not Completed: Patient at procedure or test/unavailable Pt having dialysis in room this afternoon, nursing reports he will be on this for a while.  Will hold PT this date and attempt tomorrow as appropriate.  Kreg Shropshire, DPT 04/17/2020, 3:21 PM

## 2020-04-17 NOTE — Progress Notes (Addendum)
   04/16/20 2344  Assess: MEWS Score  Temp 98.2 F (36.8 C)  BP 137/90  Pulse Rate (!) 127  ECG Heart Rate (!) 130  Level of Consciousness Alert  SpO2 96 %  O2 Device Room Air  Assess: MEWS Score  MEWS Temp 0  MEWS Systolic 0  MEWS Pulse 3  MEWS RR 0  MEWS LOC 0  MEWS Score 3  MEWS Score Color Yellow  Assess: if the MEWS score is Yellow or Red  Were vital signs taken at a resting state? Yes  Focused Assessment Change from prior assessment (see assessment flowsheet)  Early Detection of Sepsis Score *See Row Information* Medium  MEWS guidelines implemented *See Row Information* Yes  Treat  MEWS Interventions Other (Comment)  Pain Scale 0-10  Pain Score 0  Take Vital Signs  Increase Vital Sign Frequency  Yellow: Q 2hr X 2 then Q 4hr X 2, if remains yellow, continue Q 4hrs  Escalate  MEWS: Escalate Yellow: discuss with charge nurse/RN and consider discussing with provider and RRT  Notify: Charge Nurse/RN  Name of Charge Nurse/RN Notified  (I am charge)  Date Charge Nurse/RN Notified 04/16/20  Time Charge Nurse/RN Notified 2100  Notify: Provider  Provider Name/Title Rufina Falco, RN  Date Provider Notified 04/16/20  Time Provider Notified 2300  Notification Type Face-to-face  Notification Reason Change in status  Response See new orders  Date of Provider Response 04/16/20  Time of Provider Response 2224  Document  Progress note created (see row info) Yes   Discussed w/Elizabeth Ouma and she put in orders for tele and seroquel, EKG, and ultimately cardizem IV 5 mg.  Pt converted out of Afib at 00:35. Pt resting comfortably now.

## 2020-04-17 NOTE — Progress Notes (Signed)
PROGRESS NOTE    Alejandro Armstrong  OMB:[id number] DOB: 1929-11-26 DOA: 04/12/2020 PCP: Casilda Carls, MD    Chief Complaint  Patient presents with  . Weakness  . Fatigue    Brief Narrative:  Alejandro Armstrong  is a 86 y.o. male with a known history of end-stage renal disease has not had dialysis since Friday.  He has not been feeling well.  He has been feeling tired and fatigued. His wife recently had Covid.  He has not felt well enough to go to dialysis on Monday or Wednesday.  he was  Tested Covid positive on 10/27.  [name].  Slight shortness of breath.  In the ER, he was found to be hyperkalemic with a potassium of 6.8.  Hospitalist services were contacted for further evaluation  Subjective: Patient sleepy this morning.  Easily arousable.  Denies any chest pain or shortness of breath.  Overnight events noted.  Patient was quite agitated yesterday afternoon and evening.  Overnight he went into atrial fibrillation.     Assessment & Plan:   Pneumonia due to COVID-19/acute respiratory failure with hypoxia, resolved Chest x-ray showed left lung base infiltrate.  He was initially requiring 2 L of oxygen.  Has been weaned down to room air.  He received 3 doses of Remdesivir and steroids were also given.  Remdesivir had to be discontinued as he lost IV access.  Patient is a dialysis patient with very poor venous access.  Since he had otherwise improved clinically there was no need to resume the Remdesivir.  Continue with oral steroids.  Inflammatory markers improved.  D-dimer is also improving.    Paroxysmal atrial fibrillation, new diagnosis Overnight patient went into rapid A. fib.  He was given Cardizem with which he converted back to sinus rhythm.  Telemetry shows sinus rhythm.  Heart rate in the 60s.  Start on low-dose beta-blocker for now.  Will check TSH and free T4 due to history of hypothyroidism.  We will also do echocardiogram since one has not  been done recently.  Consult cardiology to evaluate him.  A. fib was likely in the setting of acute illness and being off of hemodialysis.  Chads 2 vascular score appears to be 5.  Will wait on echocardiogram as well.  Anticoagulation to be deferred to cardiology.  ESRD on HD MWF, anemia of chronic disease Patient being followed by nephrology.  He presented initially with significant hyperkalemia due to having missed dialysis sessions.   His fistula has stopped working.  Patient underwent permacath placement yesterday.  He underwent short session of dialysis.  He cut short his treatment and wanted to go back to his room.  Nephrology is following.  Potassium is normal today.  Hemoglobin remained stable.  No evidence of overt blood loss.   As per nephrology he will need to be transitioned to TTS schedule so that he can be accommodated at the [facility] cohort dialysis unit.  History of diabetes mellitus type 2, diet controlled  HbA1c 6.9.  Elevated CBGs most likely due to steroids.  Continue SSI.  CBG should improve as steroid is tapered down.  Hold off on basal insulin for now.  CBGs are stable.  Essential hypertension Patient was on amlodipine hydralazine and furosemide.  Will hold his amlodipine since we are starting him on metoprolol.  Furosemide and hydralazine will be placed on hold.  Hypothyroidism Continue home medications.  Hyperlipidemia Continue Lipitor.  DVT prophylaxis: SCDs Code Status:DNR Family  Communication: Wife was updated yesterday.  Will do so again today. Disposition:   Status is: Inpatient  Dispo: The patient is from: home              Anticipated d/c is to: Home              Anticipated d/c date is: 11/3                Consultants:   Nephrology  Vascular surgery  Cardiology  Procedures:   Dialysis  Permacath dialysis catheter placed     Objective: Vitals:   04/17/20 0143 04/17/20 0244 04/17/20 0356 04/17/20 0836  BP: 95/60 102/61 (!) 121/95 (!)  123/58  Pulse: 70 62 66 (!) 56  Resp: 18 13 16 16   Temp: 98.9 F (37.2 C) 98.1 F (36.7 C) 98.1 F (36.7 C) 98.1 F (36.7 C)  TempSrc: Axillary Oral Oral   SpO2: 95% 96% 99% 97%  Weight:      Height:        Intake/Output Summary (Last 24 hours) at 04/17/2020 0900 Last data filed at 04/17/2020 0400 Gross per 24 hour  Intake --  Output 986 ml  Net -986 ml   Filed Weights   04/12/20 1441  Weight: 72.6 kg    Examination:  General appearance: Somnolent but easily arousable.  In no distress Resp: Clear to auscultation bilaterally.  Normal effort Cardio: S1-S2 is normal regular.  No S3-S4.  No rubs murmurs or bruit GI: Abdomen is soft.  Nontender nondistended.  Bowel sounds are present normal.  No masses organomegaly Extremities: No edema.   Neurologic:  No focal neurological deficits.       Data Reviewed: I have personally reviewed following labs and imaging studies  CBC: Recent Labs  Lab 04/13/20 0646 04/14/20 0432 04/14/20 2356 04/16/20 0856 04/17/20 0553  WBC 2.8* 4.8 5.7 6.0 7.2  NEUTROABS 2.2 3.4 4.5 4.6 5.1  HGB 9.7* 9.7* 8.9* 9.5* 9.1*  HCT 29.0* 30.4* 26.2* 28.8* 26.6*  MCV 98.3 102.7* 97.8 98.3 96.7  PLT 130* 157 166 177 147    Basic Metabolic Panel: Recent Labs  Lab 04/12/20 1449 04/12/20 1740 04/13/20 0646 04/14/20 0432 04/14/20 2356 04/16/20 0856 04/17/20 0553  NA   < >  --  139 138 134* 132* 136  K   < >  --  5.6* 5.5* 5.6* 6.0* 4.6  CL   < >  --  104 106 103 101 100  CO2   < >  --  20* 17* 17* 17* 23  GLUCOSE   < >  --  283* 239* 202* 217* 202*  BUN   < >  --  54* 64* 80* 102* 57*  CREATININE   < >  --  9.75* 10.43* 10.49* 10.83* 6.90*  CALCIUM   < >  --  9.3 9.4 9.0 8.7* 8.5*  PHOS  --  4.1  --   --  6.8*  --   --    < > = values in this interval not displayed.    GFR: Estimated Creatinine Clearance: 7 mL/min (A) (by C-G formula based on SCr of 6.9 mg/dL (H)).  Liver Function Tests: Recent Labs  Lab 04/13/20 0646  04/14/20 0432 04/14/20 2356 04/16/20 0856 04/17/20 0553  AST 21 30 28 22 23   ALT 13 15 16 20 22   ALKPHOS 84 81 69 78 63  BILITOT 0.9 0.8 0.7 1.0 0.8  PROT 7.5 7.3 6.5 6.9 6.1*  ALBUMIN  3.6 3.6 3.3* 3.5 3.0*    CBG: Recent Labs  Lab 04/15/20 1626 04/15/20 2134 04/16/20 1141 04/16/20 2113 04/17/20 0835  GLUCAP 224* 248* 214* 266* 181*     Recent Results (from the past 240 hour(s))  Respiratory Panel by RT PCR (Flu A&B, Covid) - Nasopharyngeal Swab     Status: Abnormal   Collection Time: 04/12/20  3:56 PM   Specimen: Nasopharyngeal Swab  Result Value Ref Range Status   SARS Coronavirus 2 by RT PCR POSITIVE (A) NEGATIVE Final    Comment: RESULT CALLED TO, READ BACK BY AND VERIFIED WITH: [facility] REGISTER AT 5916 04/12/2020 BY MOSLEY,J (NOTE) SARS-CoV-2 target nucleic acids are DETECTED.  SARS-CoV-2 RNA is generally detectable in upper respiratory specimens  during the acute phase of infection. Positive results are indicative of the presence of the identified virus, but do not rule out bacterial infection or co-infection with other pathogens not detected by the test. Clinical correlation with patient history and other diagnostic information is necessary to determine patient infection status. The expected result is Negative.  Fact Sheet for Patients:  PinkCheek.be  Fact Sheet for Healthcare Providers: GravelBags.it  This test is not yet approved or cleared by the [place] FDA and  has been authorized for detection and/or diagnosis of SARS-CoV-2 by FDA under an Emergency Use Authorization (EUA).  This EUA will remain in effect (meaning this te st can be used) for the duration of  the COVID-19 declaration under Section 564(b)(1) of the Act, 21 U.S.C. section 360bbb-3(b)(1), unless the authorization is terminated or revoked sooner.      Influenza A by PCR NEGATIVE NEGATIVE Final   Influenza B by PCR NEGATIVE  NEGATIVE Final    Comment: (NOTE) The Xpert Xpress SARS-CoV-2/FLU/RSV assay is intended as an aid in  the diagnosis of influenza from Nasopharyngeal swab specimens and  should not be used as a sole basis for treatment. Nasal washings and  aspirates are unacceptable for Xpert Xpress SARS-CoV-2/FLU/RSV  testing.  Fact Sheet for Patients: PinkCheek.be  Fact Sheet for Healthcare Providers: GravelBags.it  This test is not yet approved or cleared by the [place] FDA and  has been authorized for detection and/or diagnosis of SARS-CoV-2 by  FDA under an Emergency Use Authorization (EUA). This EUA will remain  in effect (meaning this test can be used) for the duration of the  Covid-19 declaration under Section 564(b)(1) of the Act, 21  U.S.C. section 360bbb-3(b)(1), unless the authorization is  terminated or revoked. Performed at [facility], [address] [place], [place] [address]          Radiology Studies: PERIPHERAL VASCULAR CATHETERIZATION  Result Date: 04/16/2020 See op note       Scheduled Meds: . albuterol  2 puff Inhalation Q6H  . vitamin C  500 mg Oral Daily  . aspirin EC  81 mg Oral Daily  . atorvastatin  10 mg Oral Daily  . Chlorhexidine Gluconate Cloth  6 each Topical Q0600  . epoetin (EPOGEN/PROCRIT) injection  10,000 Units Intravenous Q T,Th,Sa-HD  . furosemide  40 mg Oral BID  . heparin  5,000 Units Subcutaneous Q8H  . hydrALAZINE  25 mg Oral BID  . influenza vaccine adjuvanted  0.5 mL Intramuscular Tomorrow-1000  . insulin aspart  0-6 Units Subcutaneous TID WC  . insulin aspart  2 Units Subcutaneous TID WC  . levothyroxine  125 mcg Oral QAC breakfast  . loratadine  10 mg Oral Daily  . metoprolol tartrate  12.5 mg Oral BID  . multivitamin  1 tablet Oral Daily  . omega-3 acid ethyl esters  2 g Oral BID  . patiromer  25.2 g Oral Daily  . pneumococcal 23 valent vaccine  0.5 mL  Intramuscular Tomorrow-1000  . predniSONE  40 mg Oral Q breakfast  . zinc sulfate  220 mg Oral Daily   Continuous Infusions: . sodium chloride    . sodium chloride       LOS: 5 days   [facility]   To page the attending provider between 7A-7P or the covering provider during after hours 7P-7A, please log into the web site [email or web address] and access using universal Armstrong password for that web site. If you do not have the password, please call the hospital operator.    04/17/2020, 9:00 AM

## 2020-04-17 NOTE — Progress Notes (Addendum)
HD treatment started today with out any issues or concerns. Patient continues to be disoriented to time and place with stating, " we can start but I need to go to the mailbox and get the mail and paper". Patient drifts off to sleep immediately after this statement. When asked if he was feeling alright during initial start of treatment, patient states he felt alright. No signs or symptoms noted to be of concern at this time. Telemetry intact at NS and v/s ar WNL. BFR at 300, DFR at 600 and 0 ml of  UF to be obtained per Dr. Beryle Lathe, NP. 1640pm: patient states during treatment, " I matter as well Kick the bucket" when writer question about his response, he states he was tired of doing this, when asked to explain patient returned back to sleep. Noted decrease in patients blood pressure with improvement. Patient becoming more and more antsy and confused during treatment with attempting to get out of bed. Dr. Juleen China made aware of patient condition during dialysis and ordered to d/c treatment if writer felt uncomfortable. Writer contacted the nursing desk for primary RN, Jackelyn Poling, will await response back.  17:25pm: Patient sleeping at this time and seems to have calmed down, dialysis continued at this time. Epogen dose requested from pharmacy to be given.  18:00pm: Patient remains calm, requesting to eat a snack. Sandwich and grapes provided. V/S WNL. Treatment continued.

## 2020-04-17 NOTE — Progress Notes (Signed)
Treatment completed with no other issues or concerns. Dr. Juleen China made aware.

## 2020-04-17 NOTE — Progress Notes (Signed)
[facility]  ROUNDING NOTE   Subjective:   Patient resting in bed, appears lethargic.Patient had an incident of Atrial fibrillation with RVR during the night. He got converted back to sinus rhythm.  Objective:  Vital signs in last 24 hours:  Temp:  [97.4 F (36.3 C)-98.9 F (37.2 C)] 98.1 F (36.7 C) (11/02 0836) Pulse Rate:  [56-127] 56 (11/02 0836) Resp:  [12-19] 16 (11/02 0836) BP: (95-158)/(47-97) 123/58 (11/02 0836) SpO2:  [95 %-100 %] 97 % (11/02 0836)  Weight change:  Filed Weights   04/12/20 1441  Weight: 72.6 kg    Intake/Output: I/O last 3 completed shifts: In: -  Out: 2086 [Urine:1300; Other:786]   Intake/Output this shift:  No intake/output data recorded.  Physical Exam: General: In no acute distress  Head: Normocephalic, Atraumatic  Eyes: Sclerae and conjunctivae clear  Lungs:  Respirations even,unlabored,lungs clear  Heart: Regular  Abdomen:  Soft, nontender, non distended  Extremities:  No  peripheral edema.  Neurologic: Lethargic,HOH  Skin: No lesions or rashes noted  Access: Rt IJ catheter,LUA AVF no thrill or bruit    Basic Metabolic Panel: Recent Labs  Lab 04/12/20 1449 04/12/20 1740 04/13/20 0646 04/13/20 0646 04/14/20 0432 04/14/20 0432 04/14/20 2356 04/16/20 0856 04/17/20 0553  NA   < >  --  139  --  138  --  134* 132* 136  K   < >  --  5.6*  --  5.5*  --  5.6* 6.0* 4.6  CL   < >  --  104  --  106  --  103 101 100  CO2   < >  --  20*  --  17*  --  17* 17* 23  GLUCOSE   < >  --  283*  --  239*  --  202* 217* 202*  BUN   < >  --  54*  --  64*  --  80* 102* 57*  CREATININE   < >  --  9.75*  --  10.43*  --  10.49* 10.83* 6.90*  CALCIUM   < >  --  9.3   < > 9.4   < > 9.0 8.7* 8.5*  PHOS  --  4.1  --   --   --   --  6.8*  --   --    < > = values in this interval not displayed.    Liver Function Tests: Recent Labs  Lab 04/13/20 0646 04/14/20 0432 04/14/20 2356 04/16/20 0856 04/17/20 0553  AST 21 30 28 22 23    ALT 13 15 16 20 22   ALKPHOS 84 81 69 78 63  BILITOT 0.9 0.8 0.7 1.0 0.8  PROT 7.5 7.3 6.5 6.9 6.1*  ALBUMIN 3.6 3.6 3.3* 3.5 3.0*   No results for input(s): LIPASE, AMYLASE in the last 168 hours. No results for input(s): AMMONIA in the last 168 hours.  CBC: Recent Labs  Lab 04/13/20 0646 04/14/20 0432 04/14/20 2356 04/16/20 0856 04/17/20 0553  WBC 2.8* 4.8 5.7 6.0 7.2  NEUTROABS 2.2 3.4 4.5 4.6 5.1  HGB 9.7* 9.7* 8.9* 9.5* 9.1*  HCT 29.0* 30.4* 26.2* 28.8* 26.6*  MCV 98.3 102.7* 97.8 98.3 96.7  PLT 130* 157 166 177 158    Cardiac Enzymes: No results for input(s): CKTOTAL, CKMB, CKMBINDEX, TROPONINI in the last 168 hours.  BNP: Invalid input(s): POCBNP  CBG: Recent Labs  Lab 04/15/20 1626 04/15/20 2134 04/16/20 1141 04/16/20 2113 04/17/20 0835  GLUCAP 224*  248* 214* 38* 181*    Microbiology: Results for orders placed or performed during the hospital encounter of 04/12/20  Respiratory Panel by RT PCR (Flu A&B, Covid) - Nasopharyngeal Swab     Status: Abnormal   Collection Time: 04/12/20  3:56 PM   Specimen: Nasopharyngeal Swab  Result Value Ref Range Status   SARS Coronavirus 2 by RT PCR POSITIVE (A) NEGATIVE Final    Comment: RESULT CALLED TO, READ BACK BY AND VERIFIED WITH: [facility] REGISTER AT 2595 04/12/2020 BY MOSLEY,J (NOTE) SARS-CoV-2 target nucleic acids are DETECTED.  SARS-CoV-2 RNA is generally detectable in upper respiratory specimens  during the acute phase of infection. Positive results are indicative of the presence of the identified virus, but do not rule out bacterial infection or co-infection with other pathogens not detected by the test. Clinical correlation with patient history and other diagnostic information is necessary to determine patient infection status. The expected result is Negative.  Fact Sheet for Patients:  PinkCheek.be  Fact Sheet for Healthcare  Providers: GravelBags.it  This test is not yet approved or cleared by the [place] FDA and  has been authorized for detection and/or diagnosis of SARS-CoV-2 by FDA under an Emergency Use Authorization (EUA).  This EUA will remain in effect (meaning this te st can be used) for the duration of  the COVID-19 declaration under Section 564(b)(1) of the Act, 21 U.S.C. section 360bbb-3(b)(1), unless the authorization is terminated or revoked sooner.      Influenza A by PCR NEGATIVE NEGATIVE Final   Influenza B by PCR NEGATIVE NEGATIVE Final    Comment: (NOTE) The Xpert Xpress SARS-CoV-2/FLU/RSV assay is intended as an aid in  the diagnosis of influenza from Nasopharyngeal swab specimens and  should not be used as a sole basis for treatment. Nasal washings and  aspirates are unacceptable for Xpert Xpress SARS-CoV-2/FLU/RSV  testing.  Fact Sheet for Patients: PinkCheek.be  Fact Sheet for Healthcare Providers: GravelBags.it  This test is not yet approved or cleared by the [place] FDA and  has been authorized for detection and/or diagnosis of SARS-CoV-2 by  FDA under an Emergency Use Authorization (EUA). This EUA will remain  in effect (meaning this test can be used) for the duration of the  Covid-19 declaration under Section 564(b)(1) of the Act, 21  U.S.C. section 360bbb-3(b)(1), unless the authorization is  terminated or revoked. Performed at [facility], [address] [place], [place] [address]     Coagulation Studies: No results for input(s): LABPROT, INR in the last 72 hours.  Urinalysis: No results for input(s): COLORURINE, LABSPEC, PHURINE, GLUCOSEU, HGBUR, BILIRUBINUR, KETONESUR, PROTEINUR, UROBILINOGEN, NITRITE, LEUKOCYTESUR in the last 72 hours.  Invalid input(s): APPERANCEUR    Imaging: PERIPHERAL VASCULAR CATHETERIZATION  Result Date: 04/16/2020 See op  note    Medications:   . sodium chloride    . sodium chloride     . albuterol  2 puff Inhalation Q6H  . vitamin C  500 mg Oral Daily  . aspirin EC  81 mg Oral Daily  . atorvastatin  10 mg Oral Daily  . Chlorhexidine Gluconate Cloth  6 each Topical Q0600  . epoetin (EPOGEN/PROCRIT) injection  10,000 Units Intravenous Q T,Th,Sa-HD  . heparin  5,000 Units Subcutaneous Q8H  . influenza vaccine adjuvanted  0.5 mL Intramuscular Tomorrow-1000  . insulin aspart  0-6 Units Subcutaneous TID WC  . insulin aspart  2 Units Subcutaneous TID WC  . levothyroxine  125 mcg Oral QAC breakfast  .  loratadine  10 mg Oral Daily  . metoprolol tartrate  12.5 mg Oral BID  . multivitamin  1 tablet Oral Daily  . omega-3 acid ethyl esters  2 g Oral BID  . patiromer  25.2 g Oral Daily  . pneumococcal 23 valent vaccine  0.5 mL Intramuscular Tomorrow-1000  . predniSONE  40 mg Oral Q breakfast  . zinc sulfate  220 mg Oral Daily   sodium chloride, sodium chloride, acetaminophen, alteplase, guaiFENesin-dextromethorphan, heparin, HYDROmorphone (DILAUDID) injection, lidocaine (PF), lidocaine-prilocaine, lidocaine-prilocaine, ondansetron **OR** ondansetron (ZOFRAN) IV, ondansetron (ZOFRAN) IV, pentafluoroprop-tetrafluoroeth, polyvinyl alcohol  Assessment/ Plan:  Mr. Alejandro Armstrong is a 86 y.o. white male with end stage renal disease on hemodialysis, hypertension, diabetes mellitus type II, hypothyroidism, idiopathic thormbocytopenia purpura, and nephrolithiasis who was admitted to [facility] on .04/12/2020  For Hyperkalemia [E87.5] ESRD (end stage renal disease) on dialysis ([place]) [N18.6, Z99.2] Generalized weakness [R53.1] COVID [U07.1]  [facility] MWF [place] RT IJ catheter, Left AVF 73 kg  1. End stage renal disease with hyperkalemia: l Patient received dialysis treatment yesterday after catheter placement, but unable to complete the treatment as patient got agitated We will dialyze the patient again today  without Ultrafiltration  2. Anemia of chronic kidney disease Hemoglobin stays low but stable We will continue monitoring CBCs  Epogen increased to 10,000 units  3. Hypertension:  Blood pressure readings within acceptable range Continue Metoprolol  4. Secondary Hyperparathyroidism: Phosphorus and Calcium not at goal We will add Phoslo today, will continue monitoring bone mineral metabolism parameters   LOS: 5 Miles Leyda 11/2/202111:15 AM

## 2020-04-18 DIAGNOSIS — J9601 Acute respiratory failure with hypoxia: Secondary | ICD-10-CM

## 2020-04-18 LAB — BASIC METABOLIC PANEL
Anion gap: 11 (ref 5–15)
BUN: 40 mg/dL — ABNORMAL HIGH (ref 8–23)
CO2: 28 mmol/L (ref 22–32)
Calcium: 8.8 mg/dL — ABNORMAL LOW (ref 8.9–10.3)
Chloride: 101 mmol/L (ref 98–111)
Creatinine, Ser: 4.78 mg/dL — ABNORMAL HIGH (ref 0.61–1.24)
GFR, Estimated: 11 mL/min — ABNORMAL LOW (ref >60)
Glucose, Bld: 161 mg/dL — ABNORMAL HIGH (ref 70–99)
Potassium: 3.7 mmol/L (ref 3.5–5.1)
Sodium: 140 mmol/L (ref 135–145)

## 2020-04-18 LAB — TSH: TSH: 0.768 u[IU]/mL (ref 0.350–4.500)

## 2020-04-18 LAB — CBC
HCT: 27.6 % — ABNORMAL LOW (ref 39.0–52.0)
Hemoglobin: 9.4 g/dL — ABNORMAL LOW (ref 13.0–17.0)
MCH: 33.2 pg (ref 26.0–34.0)
MCHC: 34.1 g/dL (ref 30.0–36.0)
MCV: 97.5 fL (ref 80.0–100.0)
Platelets: 146 10*3/uL — ABNORMAL LOW (ref 150–400)
RBC: 2.83 MIL/uL — ABNORMAL LOW (ref 4.22–5.81)
RDW: 14.3 % (ref 11.5–15.5)
WBC: 7.8 10*3/uL (ref 4.0–10.5)
nRBC: 0 % (ref 0.0–0.2)

## 2020-04-18 LAB — GLUCOSE, CAPILLARY
Glucose-Capillary: 141 mg/dL — ABNORMAL HIGH (ref 70–99)
Glucose-Capillary: 146 mg/dL — ABNORMAL HIGH (ref 70–99)
Glucose-Capillary: 191 mg/dL — ABNORMAL HIGH (ref 70–99)
Glucose-Capillary: 257 mg/dL — ABNORMAL HIGH (ref 70–99)
Glucose-Capillary: 232 mg/dL — ABNORMAL HIGH (ref 70–99)

## 2020-04-18 LAB — MRSA PCR SCREENING: MRSA by PCR: NEGATIVE

## 2020-04-18 LAB — T4, FREE: Free T4: 1.16 ng/dL — ABNORMAL HIGH (ref 0.61–1.12)

## 2020-04-18 MED ORDER — LEVOTHYROXINE SODIUM 100 MCG PO TABS
100.0000 ug | ORAL_TABLET | Freq: Every day | ORAL | Status: DC
  Administered 2020-04-19 – 2020-04-20 (×2): 100 ug via ORAL
  Filled 2020-04-18 (×2): qty 1

## 2020-04-18 NOTE — Progress Notes (Signed)
PROGRESS NOTE    Alejandro Armstrong  XNA:[id number] DOB: August 20, 1929 DOA: 04/12/2020 PCP: Casilda Carls, MD   Chief complaint: weakness Brief Narrative:  [name] a86 y.o.malewith a known history of end-stage renal disease has not had dialysis since Friday. He has not been feeling well. He has been feeling tired and fatigued. His wife recently had Covid. He has not felt well enough to go to dialysis on Monday or Wednesday. he was  Tested Covid positive on 10/27. [name]. Slight shortness of breath. In the ER, he was found to be hyperkalemic with a potassium of 6.8. Hospitalist services were contacted for further evaluation. Patient was tested positive for Covid, he developed acute hypoxemia respite failure, was initially on 2 L oxygen.  He received partial dose of remdesivir and steroids.  Oxygenation had improved so far. He also developed paroxysmal atrial fibrillation, spontaneously converted to sinus. He was also seen by nephrology for hyperkalemia with end-stage renal disease.  His AV fistula was not working, a Engineer, manufacturing was placed.   Assessment & Plan:   Active Problems:   Hyperkalemia   ESRD (end stage renal disease) on dialysis [facility])   Essential hypertension   Hypothyroidism  #1.  Pneumonia secondary to COVID-19 infection, acute respiratoryfailure with hypoxemia. Patient condition seem to be improved.  He is off oxygen.  He is still on oral steroids. There is some concern about patient confusion, PT OT has not evaluated patient.  Based on my exam, patient has significant hearing loss, he is disoriented to time. I will hold the discharge for another day to determine if patient is appropriate to go home.  #2.  Paroxysmal atrial fibrillation. TSH is still within normal limits.  Patient has converted to sinus rhythm.  No anticoagulation per cardiology recommendation.  3.  End-stage renal disease on dialysis. Followed by  nephrology.  Dialysis schedule has changed to TTS.  #4. Essential hypertension. Continue home medicine.  5.  Hypothyroidism.  TSH is borderline below.  We will decrease dose of Synthroid.  6.  Uncontrolled type 2 diabetes with hyperglycemia. Continue current regimen.     DVT prophylaxis: Heparin Code Status: DNR Family Communication: None Disposition Plan:  .   Status is: Inpatient  Remains inpatient appropriate because:Inpatient level of care appropriate due to severity of illness .  Will monitor patient for 1 more day for mental status as well as motility.   Dispo:  Patient From: Home  Planned Disposition: Home  Expected discharge date: 04/19/20  Medically stable for discharge: No         I/O last 3 completed shifts: In: -  Out: 1186 [Urine:400; Other:786] No intake/output data recorded.     Consultants:   Nephrology  Procedures: Permcath  Antimicrobials: None  Subjective: Patient has significant hearing loss.  He denies any short of breath or cough. No nausea vomiting or abdominal pain. No fever chills per Not make any urine.  Objective: Vitals:   04/18/20 0731 04/18/20 0745 04/18/20 0901 04/18/20 1213  BP: 138/86 132/70  129/71  Pulse: (!) 49 (!) 49 64 64  Resp: 20 17  17   Temp: 97.6 F (36.4 C) 97.9 F (36.6 C)  (!) 97.2 F (36.2 C)  TempSrc: Oral Oral  Oral  SpO2: 96% 95%  98%  Weight:      Height:        Intake/Output Summary (Last 24 hours) at 04/18/2020 1544 Last data filed at 04/17/2020 2301 Gross per 24 hour  Intake --  Output 200 ml  Net -200 ml   Filed Weights   04/12/20 1441  Weight: 72.6 kg    Examination:  General exam: Appears calm and comfortable  Respiratory system: Clear to auscultation. Respiratory effort normal. Cardiovascular system: S1 & S2 heard, RRR. No JVD, murmurs, rubs, gallops or clicks. No pedal edema. Gastrointestinal system: Abdomen is nondistended, soft and nontender. No organomegaly or masses  felt. Normal bowel sounds heard. Central nervous system: Alert and oriented x2. No focal neurological deficits. Extremities: Symmetric 5 x 5 power. Skin: No rashes, lesions or ulcers Psychiatry: Mood & affect appropriate.     Data Reviewed: I have personally reviewed following labs and imaging studies  CBC: Recent Labs  Lab 04/13/20 0646 04/13/20 0646 04/14/20 0432 04/14/20 2356 04/16/20 0856 04/17/20 0553 04/18/20 0553  WBC 2.8*   < > 4.8 5.7 6.0 7.2 7.8  NEUTROABS 2.2  --  3.4 4.5 4.6 5.1  --   HGB 9.7*   < > 9.7* 8.9* 9.5* 9.1* 9.4*  HCT 29.0*   < > 30.4* 26.2* 28.8* 26.6* 27.6*  MCV 98.3   < > 102.7* 97.8 98.3 96.7 97.5  PLT 130*   < > 157 166 177 158 146*   < > = values in this interval not displayed.   Basic Metabolic Panel: Recent Labs  Lab 04/12/20 1740 04/13/20 0646 04/14/20 0432 04/14/20 2356 04/16/20 0856 04/17/20 0553 04/18/20 0553  NA  --    < > 138 134* 132* 136 140  K  --    < > 5.5* 5.6* 6.0* 4.6 3.7  CL  --    < > 106 103 101 100 101  CO2  --    < > 17* 17* 17* 23 28  GLUCOSE  --    < > 239* 202* 217* 202* 161*  BUN  --    < > 64* 80* 102* 57* 40*  CREATININE  --    < > 10.43* 10.49* 10.83* 6.90* 4.78*  CALCIUM  --    < > 9.4 9.0 8.7* 8.5* 8.8*  PHOS 4.1  --   --  6.8*  --   --   --    < > = values in this interval not displayed.   GFR: Estimated Creatinine Clearance: 10.2 mL/min (A) (by C-G formula based on SCr of 4.78 mg/dL (H)). Liver Function Tests: Recent Labs  Lab 04/13/20 0646 04/14/20 0432 04/14/20 2356 04/16/20 0856 04/17/20 0553  AST 21 30 28 22 23   ALT 13 15 16 20 22   ALKPHOS 84 81 69 78 63  BILITOT 0.9 0.8 0.7 1.0 0.8  PROT 7.5 7.3 6.5 6.9 6.1*  ALBUMIN 3.6 3.6 3.3* 3.5 3.0*   No results for input(s): LIPASE, AMYLASE in the last 168 hours. No results for input(s): AMMONIA in the last 168 hours. Coagulation Profile: No results for input(s): INR, PROTIME in the last 168 hours. Cardiac Enzymes: No results for input(s):  CKTOTAL, CKMB, CKMBINDEX, TROPONINI in the last 168 hours. BNP (last 3 results) No results for input(s): PROBNP in the last 8760 hours. HbA1C: No results for input(s): HGBA1C in the last 72 hours. CBG: Recent Labs  Lab 04/17/20 1743 04/17/20 2023 04/18/20 0733 04/18/20 0810 04/18/20 1216  GLUCAP 183* 229* 146* 141* 191*   Lipid Profile: No results for input(s): CHOL, HDL, LDLCALC, TRIG, CHOLHDL, LDLDIRECT in the last 72 hours. Thyroid Function Tests: Recent Labs    04/18/20 0553  TSH 0.768  FREET4  1.16*   Anemia Panel: Recent Labs    04/16/20 0856  FERRITIN 1,154*   Sepsis Labs: No results for input(s): PROCALCITON, LATICACIDVEN in the last 168 hours.  Recent Results (from the past 240 hour(s))  Respiratory Panel by RT PCR (Flu A&B, Covid) - Nasopharyngeal Swab     Status: Abnormal   Collection Time: 04/12/20  3:56 PM   Specimen: Nasopharyngeal Swab  Result Value Ref Range Status   SARS Coronavirus 2 by RT PCR POSITIVE (A) NEGATIVE Final    Comment: RESULT CALLED TO, READ BACK BY AND VERIFIED WITH: [facility] REGISTER AT 0630 04/12/2020 BY MOSLEY,J (NOTE) SARS-CoV-2 target nucleic acids are DETECTED.  SARS-CoV-2 RNA is generally detectable in upper respiratory specimens  during the acute phase of infection. Positive results are indicative of the presence of the identified virus, but do not rule out bacterial infection or co-infection with other pathogens not detected by the test. Clinical correlation with patient history and other diagnostic information is necessary to determine patient infection status. The expected result is Negative.  Fact Sheet for Patients:  PinkCheek.be  Fact Sheet for Healthcare Providers: GravelBags.it  This test is not yet approved or cleared by the [place] FDA and  has been authorized for detection and/or diagnosis of SARS-CoV-2 by FDA under an Emergency Use Authorization  (EUA).  This EUA will remain in effect (meaning this te st can be used) for the duration of  the COVID-19 declaration under Section 564(b)(1) of the Act, 21 U.S.C. section 360bbb-3(b)(1), unless the authorization is terminated or revoked sooner.      Influenza A by PCR NEGATIVE NEGATIVE Final   Influenza B by PCR NEGATIVE NEGATIVE Final    Comment: (NOTE) The Xpert Xpress SARS-CoV-2/FLU/RSV assay is intended as an aid in  the diagnosis of influenza from Nasopharyngeal swab specimens and  should not be used as a sole basis for treatment. Nasal washings and  aspirates are unacceptable for Xpert Xpress SARS-CoV-2/FLU/RSV  testing.  Fact Sheet for Patients: PinkCheek.be  Fact Sheet for Healthcare Providers: GravelBags.it  This test is not yet approved or cleared by the [place] FDA and  has been authorized for detection and/or diagnosis of SARS-CoV-2 by  FDA under an Emergency Use Authorization (EUA). This EUA will remain  in effect (meaning this test can be used) for the duration of the  Covid-19 declaration under Section 564(b)(1) of the Act, 21  U.S.C. section 360bbb-3(b)(1), unless the authorization is  terminated or revoked. Performed at [facility], [address] [place], [place] [address]          Radiology Studies: PERIPHERAL VASCULAR CATHETERIZATION  Result Date: 04/16/2020 See op note  ECHOCARDIOGRAM COMPLETE  Result Date: 04/17/2020    ECHOCARDIOGRAM REPORT   Patient Name:   HICKS FEICK Date of Exam: 04/17/2020 Medical Rec #:  [id number]           Height:       67.5 in Accession #:    [id number]          Weight:       160.0 lb Date of Birth:  12-23-1929           BSA:          1.849 m Patient Age:    60 years            BP:           123/58 mmHg Patient Gender: M  HR:           56 bpm. Exam Location:  [facility] Procedure: 2D Echo, Color Doppler and Cardiac Doppler  Indications:     I48.91 Atrial Fibrillation  History:         Patient has prior history of Echocardiogram examinations. Risk                  Factors:Hypertension, Diabetes and Dyslipidemia. Pt tested                  positive for COVID-19 on 04/12/20.  Sonographer:     Charmayne Sheer RDCS (AE) Referring Phys:  [id number] Bonnielee Haff Diagnosing Phys: Serafina Royals MD  Sonographer Comments: Image acquisition challenging due to uncooperative patient. IMPRESSIONS  1. Left ventricular ejection fraction, by estimation, is 50 to 55%. The left ventricle has low normal function. The left ventricle has no regional wall motion abnormalities. Left ventricular diastolic parameters were normal.  2. Right ventricular systolic function is normal. The right ventricular size is normal.  3. The mitral valve is normal in structure. Mild mitral valve regurgitation.  4. The aortic valve is normal in structure. Aortic valve regurgitation is trivial. FINDINGS  Left Ventricle: Left ventricular ejection fraction, by estimation, is 50 to 55%. The left ventricle has low normal function. The left ventricle has no regional wall motion abnormalities. The left ventricular internal cavity size was normal in size. There is no left ventricular hypertrophy. Left ventricular diastolic parameters were normal. Right Ventricle: The right ventricular size is normal. No increase in right ventricular wall thickness. Right ventricular systolic function is normal. Left Atrium: Left atrial size was normal in size. Right Atrium: Right atrial size was normal in size. Pericardium: There is no evidence of pericardial effusion. Mitral Valve: The mitral valve is normal in structure. Mild mitral valve regurgitation. MV peak gradient, 6.1 mmHg. The mean mitral valve gradient is 2.0 mmHg. Tricuspid Valve: The tricuspid valve is normal in structure. Tricuspid valve regurgitation is mild. Aortic Valve: The aortic valve is normal in structure. Aortic valve regurgitation is  trivial. Aortic valve mean gradient measures 5.0 mmHg. Aortic valve peak gradient measures 9.7 mmHg. Aortic valve area, by VTI measures 2.46 cm. Pulmonic Valve: The pulmonic valve was normal in structure. Pulmonic valve regurgitation is not visualized. Aorta: The aortic root and ascending aorta are structurally normal, with no evidence of dilitation. IAS/Shunts: No atrial level shunt detected by color flow Doppler.  LEFT VENTRICLE PLAX 2D LVIDd:         5.06 cm  Diastology LVIDs:         2.72 cm  LV e' medial:    7.07 cm/s LV PW:         1.27 cm  LV E/e' medial:  11.2 LV IVS:        0.87 cm  LV e' lateral:   7.83 cm/s LVOT diam:     2.10 cm  LV E/e' lateral: 10.1 LV SV:         88 LV SV Index:   48 LVOT Area:     3.46 cm  RIGHT VENTRICLE RV Basal diam:  3.47 cm LEFT ATRIUM             Index LA diam:        4.80 cm 2.60 cm/m LA Vol (A2C):   41.6 ml 22.49 ml/m LA Vol (A4C):   67.2 ml 36.33 ml/m LA Biplane Vol: 56.4 ml 30.50 ml/m  AORTIC VALVE AV Area (  Vmax):    2.73 cm AV Area (Vmean):   2.62 cm AV Area (VTI):     2.46 cm AV Vmax:           156.00 cm/s AV Vmean:          103.000 cm/s AV VTI:            0.358 m AV Peak Grad:      9.7 mmHg AV Mean Grad:      5.0 mmHg LVOT Vmax:         123.00 cm/s LVOT Vmean:        77.800 cm/s LVOT VTI:          0.254 m LVOT/AV VTI ratio: 0.71  AORTA Ao Root diam: 3.40 cm MITRAL VALVE MV Area (PHT): 2.95 cm     SHUNTS MV Peak grad:  6.1 mmHg     Systemic VTI:  0.25 m MV Mean grad:  2.0 mmHg     Systemic Diam: 2.10 cm MV Vmax:       1.23 m/s MV Vmean:      67.0 cm/s MV Decel Time: 257 msec MV E velocity: 79.10 cm/s MV A velocity: 107.00 cm/s MV E/A ratio:  0.74 Serafina Royals MD Electronically signed by Serafina Royals MD Signature Date/Time: 04/17/2020/1:49:14 PM    Final         Scheduled Meds: . albuterol  2 puff Inhalation Q6H  . vitamin C  500 mg Oral Daily  . aspirin EC  81 mg Oral Daily  . atorvastatin  10 mg Oral Daily  . calcium acetate  1,334 mg Oral TID WC   . Chlorhexidine Gluconate Cloth  6 each Topical Q0600  . epoetin (EPOGEN/PROCRIT) injection  10,000 Units Intravenous Q T,Th,Sa-HD  . heparin  5,000 Units Subcutaneous Q8H  . insulin aspart  0-6 Units Subcutaneous TID WC  . insulin aspart  2 Units Subcutaneous TID WC  . levothyroxine  125 mcg Oral QAC breakfast  . loratadine  10 mg Oral Daily  . metoprolol tartrate  12.5 mg Oral BID  . multivitamin  1 tablet Oral Daily  . omega-3 acid ethyl esters  2 g Oral BID  . polyethylene glycol  17 g Oral Daily  . predniSONE  40 mg Oral Q breakfast  . zinc sulfate  220 mg Oral Daily   Continuous Infusions: . sodium chloride    . sodium chloride       LOS: 6 days    Time spent: 34 minutes    Sharen Hones, MD [facility]   To contact the attending provider between 7A-7P or the covering provider during after hours 7P-7A, please log into the web site [email or web address] and access using universal [place] password for that web site. If you do not have the password, please call the hospital operator.  04/18/2020, 3:44 PM

## 2020-04-18 NOTE — TOC Initial Note (Signed)
Transition of Care [facility]) - Initial/Assessment Note    Patient Details  Name: Alejandro Armstrong MRN: [id number] Date of Birth: 1929/09/01  Transition [facility]) CM/SW Contact:    Shelbie Hutching, RN Phone Number: 04/18/2020, 8:58 AM  Clinical Narrative:                 Patient admitted to the hospital with COVID, patient has ESRD on MWF dialysis.  When patient is ready for discharge he will be going to a cohort clinic on TTHSat.  Patient is from home with his wife and is usually pretty independent.  Patient walks with a cane and he drives himself to dialysis.  Patient's wife, Izora Gala reports that they have a private pay caregiver that comes in and helps both of them.  RNCM was able to speak with patient via phone, he is in agreement with home health services and has no preference in agency.  [place] with [facility] given referral for RN, PT, and OT.  Patient's wife will be able to come and pick him up at discharge.   Elvera Bicker with [facility] aware of admission to hospital.   Expected Discharge Plan: [place] Barriers to Discharge: Continued Medical Work up   Patient Goals and CMS Choice   CMS Medicare.gov Compare Post Acute Care list provided to:: Patient Choice offered to / list presented to : Patient  Expected Discharge Plan and Services Expected Discharge Plan: [place]   Discharge Planning Services: CM Consult Post Acute Care Choice: [place] arrangements for the past 2 months: Single Family Home                           HH Arranged: RN, PT [facility] Agency: [facility] Date [place]: 04/18/20 Time [place]: [phone] Representative spoke with at [place]: [place] Arrangements/Services Living arrangements for the past 2 months: [place] with:: Spouse Patient language and need for interpreter reviewed:: Yes Do you feel safe going back to the place where you  live?: Yes      Need for Family Participation in Patient Care: Yes (Comment) (ESRD, dialysis, COVID) Care giver support system in place?: Yes (comment) (wife) Current home services: DME (cane) Criminal Activity/Legal Involvement Pertinent to Current Situation/Hospitalization: No - Comment as needed  Activities of Daily Living Home Assistive Devices/Equipment: Environmental consultant (specify type) ADL Screening (condition at time of admission) Patient's cognitive ability adequate to safely complete daily activities?: Yes Is the patient deaf or have difficulty hearing?: Yes Does the patient have difficulty seeing, even when wearing glasses/contacts?: Yes Does the patient have difficulty concentrating, remembering, or making decisions?: No Patient able to express need for assistance with ADLs?: Yes Does the patient have difficulty dressing or bathing?: No Independently performs ADLs?: Yes (appropriate for developmental age) Does the patient have difficulty walking or climbing stairs?: Yes Weakness of Legs: Both Weakness of Arms/Hands: None  Permission Sought/Granted Permission sought to share information with : Case Manager, Family Supports, Other (comment) Permission granted to share information with : Yes, Verbal Permission Granted  Share Information with NAME: Izora Gala  Permission granted to share info w AGENCY: Home health agency  Permission granted to share info w Relationship: wife     Emotional Assessment   Attitude/Demeanor/Rapport: Engaged Affect (typically observed): Accepting Orientation: : Oriented to Self, Oriented to Place, Oriented to  Time, Oriented to Situation Alcohol /  Substance Use: Not Applicable Psych Involvement: No (comment)  Admission diagnosis:  Hyperkalemia [E87.5] ESRD (end stage renal disease) on dialysis ([place]) [N18.6, Z99.2] Generalized weakness [R53.1] COVID [U07.1] Patient Active Problem List   Diagnosis Date Noted  . Pneumonia due to COVID-19 virus   .  Generalized weakness   . Recurrent UTI 02/16/2020  . Chest pain 11/04/2019  . Complication of vascular access for dialysis 11/20/2017  . Hyperlipidemia 03/26/2017  . ESRD (end stage renal disease) on dialysis ([place]) 03/26/2017  . Postop check 04/30/2016  . Fever chills 09/18/2015  . Bacteremia 09/18/2015  . Acute on chronic renal insufficiency 09/01/2015  . Hyperkalemia 09/01/2015  . MRSA bacteremia secondary to PICC line infection 09/01/2015  . Type 2 diabetes mellitus ([place]) 09/01/2015  . UTI (lower urinary tract infection) 08/06/2015  . Idiopathic thrombocytopenic purpura ([place]) 08/02/2015  . B12 deficiency 07/24/2015  . Pseudomonas infection 04/28/2014  . 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 Urinary urgency 02/21/2013  . Hypertrophy of prostate with urinary obstruction and other lower urinary tract symptoms (LUTS) 11/18/2012  . Calculus of kidney 03/12/2012  . Increased frequency of urination 03/12/2012  . Nocturia 03/12/2012  . 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 Urethral stricture 03/12/2012  . Urinary tract infection 03/12/2012  . Essential hypertension 03/31/2011  . Hypothyroidism 03/31/2011  . Chronic kidney disease, stage IV (severe) ([place]) 03/31/2011   PCP:  Casilda Carls, MD Pharmacy:   [id number] - [place], [place] [address] [place] [address] Phone: [phone] Fax: [phone]     Social Determinants of Health (SDOH) Interventions    Readmission Risk Interventions No flowsheet data found.

## 2020-04-18 NOTE — Evaluation (Signed)
Physical Therapy Evaluation Patient Details Name: Alejandro Armstrong MRN: [id number] DOB: 1929/11/03 Today's Date: 04/18/2020   History of Present Illness  86 y.o. male with a known history of end-stage renal disease, prior to admission he missed a few HD sessions due to feeling very tired and low energy.  He has not been feeling well and very fatigued. His wife recently had Covid.  Found to be Covid positive.  Clinical Impression  Pt was laying in bed with eyes closed on arrival, woke relatively easily but was more concerned about getting some cold water than working with PT.  He did become more open to the idea of doing some activity and ultimately was able to walk ~70 ft with walker and relatively consistent and safe gait.  His O2 remained in the 90s on room air and though he c/o some fatigue he ultimately did relatively well. Suggesting HHPT to insure safe and appropriate transition back to active lifestyle.   Follow Up Recommendations Home health PT    Equipment Recommendations  None recommended by PT    Recommendations for Other Services       Precautions / Restrictions Precautions Precautions: Fall Restrictions Weight Bearing Restrictions: No      Mobility  Bed Mobility Overal bed mobility: Independent             General bed mobility comments: Pt was able to get himself to sitting EOB w/o direct assist    Transfers Overall transfer level: Modified independent Equipment used: Rolling walker (2 wheeled)             General transfer comment: Pt was able to rise to standing w/o direct assist, good confidence and balance  Ambulation/Gait Ambulation/Gait assistance: Supervision Gait Distance (Feet): 70 Feet Assistive device: Rolling walker (2 wheeled)       General Gait Details: Pt did well with ambulation and showed good effort with the entire situation.  O2 remained in the high 90s on room air.    Stairs            Wheelchair Mobility     Modified Rankin (Stroke Patients Only)       Balance Overall balance assessment: Modified Independent                                           Pertinent Vitals/Pain Pain Assessment: No/denies pain    Home Living Family/patient expects to be discharged to:: Private residence Living Arrangements: Spouse/significant other Available Help at Discharge: Available 24 hours/day;Family;Friend(s)   Home Access: Stairs to enter Entrance Stairs-Rails: Right Entrance Stairs-Number of Steps: 4 Home Layout: One level Home Equipment: Walker - 2 wheels      Prior Function Level of Independence: Independent         Comments: Pt reports he drives, runs errands, and generally is able to stay pretty active     Hand Dominance        Extremity/Trunk Assessment   Upper Extremity Assessment Upper Extremity Assessment: Generalized weakness;Overall [facility] for tasks assessed    Lower Extremity Assessment Lower Extremity Assessment: Generalized weakness;Overall WFL for tasks assessed       Communication   Communication: No difficulties;HOH  Cognition Arousal/Alertness: Awake/alert Behavior During Therapy: WFL for tasks assessed/performed Overall Cognitive Status: Difficult to assess  General Comments General comments (skin integrity, edema, etc.): Pt with O2 staying in the 90s on room air t/o the effort    Exercises     Assessment/Plan    PT Assessment Patient needs continued PT services  PT Problem List Decreased strength;Decreased range of motion;Decreased activity tolerance;Decreased balance;Decreased mobility;Decreased coordination;Decreased safety awareness;Decreased knowledge of use of DME;Cardiopulmonary status limiting activity       PT Treatment Interventions DME instruction;Stair training;Gait training;Functional mobility training;Therapeutic activities;Therapeutic exercise;Balance  training;Cognitive remediation;Patient/family education    PT Goals (Current goals can be found in the Care Plan section)  Acute Rehab PT Goals Patient Stated Goal: go home PT Goal Formulation: With patient Time For Goal Achievement: 05/02/20 Potential to Achieve Goals: Good    Frequency Min 2X/week   Barriers to discharge        Co-evaluation               AM-PAC PT "6 Clicks" Mobility  Outcome Measure Help needed turning from your back to your side while in a flat bed without using bedrails?: None Help needed moving from lying on your back to sitting on the side of a flat bed without using bedrails?: None Help needed moving to and from a bed to a chair (including a wheelchair)?: None Help needed standing up from a chair using your arms (e.g., wheelchair or bedside chair)?: A Little Help needed to walk in hospital room?: A Little Help needed climbing 3-5 steps with a railing? : A Little 6 Click Score: 21    End of Session Equipment Utilized During Treatment: Gait belt Activity Tolerance: Patient tolerated treatment well Patient left: with call bell/phone within reach;with bed alarm set   PT Visit Diagnosis: Muscle weakness (generalized) (M62.81);Difficulty in walking, not elsewhere classified (R26.2)    Time: 5974-1638 PT Time Calculation (min) (ACUTE ONLY): 33 min   Charges:   PT Evaluation $PT Eval Low Complexity: 1 Low PT Treatments $Therapeutic Activity: 8-22 mins        Kreg Shropshire, DPT 04/18/2020, 3:43 PM

## 2020-04-18 NOTE — Progress Notes (Signed)
[facility]  ROUNDING NOTE   Subjective:   Patient is resting in bed, with eyes closed. He is arousable to call, answers orientation questions appropriately.He denies SOB today  Objective:  Vital signs in last 24 hours:  Temp:  [96.8 F (36 C)-98.4 F (36.9 C)] 97.9 F (36.6 C) (11/03 0745) Pulse Rate:  [49-79] 64 (11/03 0901) Resp:  [12-22] 17 (11/03 0745) BP: (101-138)/(56-86) 132/70 (11/03 0745) SpO2:  [94 %-100 %] 95 % (11/03 0745)  Weight change:  Filed Weights   04/12/20 1441  Weight: 72.6 kg    Intake/Output: I/O last 3 completed shifts: In: -  Out: 1186 [Urine:400; Other:786]   Intake/Output this shift:  No intake/output data recorded.  Physical Exam: General: Resting in bed, in no acute distress  Head: Normocephalic, Atraumatic  Eyes: Anicteric  Lungs:  Lungs clear bilaterally  Heart: S1S2,no rubs or gallops  Abdomen:  Soft, nontender, non distended  Extremities:  No  peripheral edema.  Neurologic: Alert, awake, answers questions appropriately , very hard of hearing  Skin: No lesions or rashes noted  Access: Rt IJ catheter,LUA AVF no thrill or bruit    Basic Metabolic Panel: Recent Labs  Lab 04/12/20 1740 04/13/20 0646 04/14/20 0432 04/14/20 0432 04/14/20 2356 04/14/20 2356 04/16/20 0856 04/17/20 0553 04/18/20 0553  NA  --    < > 138  --  134*  --  132* 136 140  K  --    < > 5.5*  --  5.6*  --  6.0* 4.6 3.7  CL  --    < > 106  --  103  --  101 100 101  CO2  --    < > 17*  --  17*  --  17* 23 28  GLUCOSE  --    < > 239*  --  202*  --  217* 202* 161*  BUN  --    < > 64*  --  80*  --  102* 57* 40*  CREATININE  --    < > 10.43*  --  10.49*  --  10.83* 6.90* 4.78*  CALCIUM  --    < > 9.4   < > 9.0   < > 8.7* 8.5* 8.8*  PHOS 4.1  --   --   --  6.8*  --   --   --   --    < > = values in this interval not displayed.    Liver Function Tests: Recent Labs  Lab 04/13/20 0646 04/14/20 0432 04/14/20 2356 04/16/20 0856 04/17/20 0553   AST 21 30 28 22 23   ALT 13 15 16 20 22   ALKPHOS 84 81 69 78 63  BILITOT 0.9 0.8 0.7 1.0 0.8  PROT 7.5 7.3 6.5 6.9 6.1*  ALBUMIN 3.6 3.6 3.3* 3.5 3.0*   No results for input(s): LIPASE, AMYLASE in the last 168 hours. No results for input(s): AMMONIA in the last 168 hours.  CBC: Recent Labs  Lab 04/13/20 0646 04/13/20 0646 04/14/20 0432 04/14/20 2356 04/16/20 0856 04/17/20 0553 04/18/20 0553  WBC 2.8*   < > 4.8 5.7 6.0 7.2 7.8  NEUTROABS 2.2  --  3.4 4.5 4.6 5.1  --   HGB 9.7*   < > 9.7* 8.9* 9.5* 9.1* 9.4*  HCT 29.0*   < > 30.4* 26.2* 28.8* 26.6* 27.6*  MCV 98.3   < > 102.7* 97.8 98.3 96.7 97.5  PLT 130*   < > 157 166 177 158  146*   < > = values in this interval not displayed.    Cardiac Enzymes: No results for input(s): CKTOTAL, CKMB, CKMBINDEX, TROPONINI in the last 168 hours.  BNP: Invalid input(s): POCBNP  CBG: Recent Labs  Lab 04/17/20 1210 04/17/20 1743 04/17/20 2023 04/18/20 0733 04/18/20 0810  GLUCAP 150* 183* 229* 146* 141*    Microbiology: Results for orders placed or performed during the hospital encounter of 04/12/20  Respiratory Panel by RT PCR (Flu A&B, Covid) - Nasopharyngeal Swab     Status: Abnormal   Collection Time: 04/12/20  3:56 PM   Specimen: Nasopharyngeal Swab  Result Value Ref Range Status   SARS Coronavirus 2 by RT PCR POSITIVE (A) NEGATIVE Final    Comment: RESULT CALLED TO, READ BACK BY AND VERIFIED WITH: [facility] REGISTER AT 3295 04/12/2020 BY MOSLEY,J (NOTE) SARS-CoV-2 target nucleic acids are DETECTED.  SARS-CoV-2 RNA is generally detectable in upper respiratory specimens  during the acute phase of infection. Positive results are indicative of the presence of the identified virus, but do not rule out bacterial infection or co-infection with other pathogens not detected by the test. Clinical correlation with patient history and other diagnostic information is necessary to determine patient infection status. The expected result  is Negative.  Fact Sheet for Patients:  PinkCheek.be  Fact Sheet for Healthcare Providers: GravelBags.it  This test is not yet approved or cleared by the [place] FDA and  has been authorized for detection and/or diagnosis of SARS-CoV-2 by FDA under an Emergency Use Authorization (EUA).  This EUA will remain in effect (meaning this te st can be used) for the duration of  the COVID-19 declaration under Section 564(b)(1) of the Act, 21 U.S.C. section 360bbb-3(b)(1), unless the authorization is terminated or revoked sooner.      Influenza A by PCR NEGATIVE NEGATIVE Final   Influenza B by PCR NEGATIVE NEGATIVE Final    Comment: (NOTE) The Xpert Xpress SARS-CoV-2/FLU/RSV assay is intended as an aid in  the diagnosis of influenza from Nasopharyngeal swab specimens and  should not be used as a sole basis for treatment. Nasal washings and  aspirates are unacceptable for Xpert Xpress SARS-CoV-2/FLU/RSV  testing.  Fact Sheet for Patients: PinkCheek.be  Fact Sheet for Healthcare Providers: GravelBags.it  This test is not yet approved or cleared by the [place] FDA and  has been authorized for detection and/or diagnosis of SARS-CoV-2 by  FDA under an Emergency Use Authorization (EUA). This EUA will remain  in effect (meaning this test can be used) for the duration of the  Covid-19 declaration under Section 564(b)(1) of the Act, 21  U.S.C. section 360bbb-3(b)(1), unless the authorization is  terminated or revoked. Performed at [facility], [address] [place], [place] [address]     Coagulation Studies: No results for input(s): LABPROT, INR in the last 72 hours.  Urinalysis: No results for input(s): COLORURINE, LABSPEC, PHURINE, GLUCOSEU, HGBUR, BILIRUBINUR, KETONESUR, PROTEINUR, UROBILINOGEN, NITRITE, LEUKOCYTESUR in the last 72  hours.  Invalid input(s): APPERANCEUR    Imaging: PERIPHERAL VASCULAR CATHETERIZATION  Result Date: 04/16/2020 See op note  ECHOCARDIOGRAM COMPLETE  Result Date: 04/17/2020    ECHOCARDIOGRAM REPORT   Patient Name:   Alejandro Armstrong Date of Exam: 04/17/2020 Medical Rec #:  [id number]           Height:       67.5 in Accession #:    [id number]          Weight:  160.0 lb Date of Birth:  1929/11/06           BSA:          1.849 m Patient Age:    73 years            BP:           123/58 mmHg Patient Gender: M                   HR:           56 bpm. Exam Location:  [facility] Procedure: 2D Echo, Color Doppler and Cardiac Doppler Indications:     I48.91 Atrial Fibrillation  History:         Patient has prior history of Echocardiogram examinations. Risk                  Factors:Hypertension, Diabetes and Dyslipidemia. Pt tested                  positive for COVID-19 on 04/12/20.  Sonographer:     Charmayne Sheer RDCS (AE) Referring Phys:  [id number] Bonnielee Haff Diagnosing Phys: Serafina Royals MD  Sonographer Comments: Image acquisition challenging due to uncooperative patient. IMPRESSIONS  1. Left ventricular ejection fraction, by estimation, is 50 to 55%. The left ventricle has low normal function. The left ventricle has no regional wall motion abnormalities. Left ventricular diastolic parameters were normal.  2. Right ventricular systolic function is normal. The right ventricular size is normal.  3. The mitral valve is normal in structure. Mild mitral valve regurgitation.  4. The aortic valve is normal in structure. Aortic valve regurgitation is trivial. FINDINGS  Left Ventricle: Left ventricular ejection fraction, by estimation, is 50 to 55%. The left ventricle has low normal function. The left ventricle has no regional wall motion abnormalities. The left ventricular internal cavity size was normal in size. There is no left ventricular hypertrophy. Left ventricular diastolic parameters were normal. Right Ventricle:  The right ventricular size is normal. No increase in right ventricular wall thickness. Right ventricular systolic function is normal. Left Atrium: Left atrial size was normal in size. Right Atrium: Right atrial size was normal in size. Pericardium: There is no evidence of pericardial effusion. Mitral Valve: The mitral valve is normal in structure. Mild mitral valve regurgitation. MV peak gradient, 6.1 mmHg. The mean mitral valve gradient is 2.0 mmHg. Tricuspid Valve: The tricuspid valve is normal in structure. Tricuspid valve regurgitation is mild. Aortic Valve: The aortic valve is normal in structure. Aortic valve regurgitation is trivial. Aortic valve mean gradient measures 5.0 mmHg. Aortic valve peak gradient measures 9.7 mmHg. Aortic valve area, by VTI measures 2.46 cm. Pulmonic Valve: The pulmonic valve was normal in structure. Pulmonic valve regurgitation is not visualized. Aorta: The aortic root and ascending aorta are structurally normal, with no evidence of dilitation. IAS/Shunts: No atrial level shunt detected by color flow Doppler.  LEFT VENTRICLE PLAX 2D LVIDd:         5.06 cm  Diastology LVIDs:         2.72 cm  LV e' medial:    7.07 cm/s LV PW:         1.27 cm  LV E/e' medial:  11.2 LV IVS:        0.87 cm  LV e' lateral:   7.83 cm/s LVOT diam:     2.10 cm  LV E/e' lateral: 10.1 LV SV:         88 LV  SV Index:   48 LVOT Area:     3.46 cm  RIGHT VENTRICLE RV Basal diam:  3.47 cm LEFT ATRIUM             Index LA diam:        4.80 cm 2.60 cm/m LA Vol (A2C):   41.6 ml 22.49 ml/m LA Vol (A4C):   67.2 ml 36.33 ml/m LA Biplane Vol: 56.4 ml 30.50 ml/m  AORTIC VALVE AV Area (Vmax):    2.73 cm AV Area (Vmean):   2.62 cm AV Area (VTI):     2.46 cm AV Vmax:           156.00 cm/s AV Vmean:          103.000 cm/s AV VTI:            0.358 m AV Peak Grad:      9.7 mmHg AV Mean Grad:      5.0 mmHg LVOT Vmax:         123.00 cm/s LVOT Vmean:        77.800 cm/s LVOT VTI:          0.254 m LVOT/AV VTI ratio: 0.71   AORTA Ao Root diam: 3.40 cm MITRAL VALVE MV Area (PHT): 2.95 cm     SHUNTS MV Peak grad:  6.1 mmHg     Systemic VTI:  0.25 m MV Mean grad:  2.0 mmHg     Systemic Diam: 2.10 cm MV Vmax:       1.23 m/s MV Vmean:      67.0 cm/s MV Decel Time: 257 msec MV E velocity: 79.10 cm/s MV A velocity: 107.00 cm/s MV E/A ratio:  0.74 Serafina Royals MD Electronically signed by Serafina Royals MD Signature Date/Time: 04/17/2020/1:49:14 PM    Final      Medications:   . sodium chloride    . sodium chloride     . albuterol  2 puff Inhalation Q6H  . vitamin C  500 mg Oral Daily  . aspirin EC  81 mg Oral Daily  . atorvastatin  10 mg Oral Daily  . calcium acetate  1,334 mg Oral TID WC  . Chlorhexidine Gluconate Cloth  6 each Topical Q0600  . epoetin (EPOGEN/PROCRIT) injection  10,000 Units Intravenous Q T,Th,Sa-HD  . heparin  5,000 Units Subcutaneous Q8H  . insulin aspart  0-6 Units Subcutaneous TID WC  . insulin aspart  2 Units Subcutaneous TID WC  . levothyroxine  125 mcg Oral QAC breakfast  . loratadine  10 mg Oral Daily  . metoprolol tartrate  12.5 mg Oral BID  . multivitamin  1 tablet Oral Daily  . omega-3 acid ethyl esters  2 g Oral BID  . patiromer  25.2 g Oral Daily  . polyethylene glycol  17 g Oral Daily  . predniSONE  40 mg Oral Q breakfast  . zinc sulfate  220 mg Oral Daily   sodium chloride, sodium chloride, acetaminophen, alteplase, guaiFENesin-dextromethorphan, heparin, HYDROmorphone (DILAUDID) injection, lidocaine (PF), lidocaine-prilocaine, lidocaine-prilocaine, ondansetron **OR** ondansetron (ZOFRAN) IV, ondansetron (ZOFRAN) IV, pentafluoroprop-tetrafluoroeth, polyvinyl alcohol  Assessment/ Plan:  Mr. Alejandro Armstrong is a 86 y.o. white male with end stage renal disease on hemodialysis, hypertension, diabetes mellitus type II, hypothyroidism, idiopathic thormbocytopenia purpura, and nephrolithiasis who was admitted to [facility] on .04/12/2020  For Hyperkalemia [E87.5] ESRD (end stage renal  disease) on dialysis ([place]) [N18.6, Z99.2] Generalized weakness [R53.1] COVID [U07.1]  [facility] MWF [facility] RT IJ catheter, Left AVF  73 kg  1. End stage renal disease with hyperkalemia: l Dialysis yesterday, patient was confused during the dialysis, but was able to calm down and complete the treatment Volume and electrolyte status acceptable No additional dialysis required today Patient will be on TTS schedule now, preparing for  outpatient dialysis Covid 19 + schedule  2. Anemia of chronic kidney disease Hemoglobin stays low but stable  9.4 today He is on Epogen 10,000 units with dialysis TTS  3. Hypertension:  Stays controlled with current antihypertensive regimen  4. Secondary Hyperparathyroidism: Phosphorus 6.8 on 04/14/2020 Calcium 8.8  Added Calcium Acetate yesterday   LOS: 6 Larenda Reedy 11/3/202111:48 AM

## 2020-04-18 NOTE — Evaluation (Signed)
Occupational Therapy Evaluation Patient Details Name: Alejandro Armstrong MRN: [id number] DOB: [birth year] Today's Date: 04/18/2020    History of Present Illness 86 y.o. male with a known history of end-stage renal disease, prior to admission he missed a few HD sessions due to feeling very tired and low energy.  He has not been feeling well and very fatigued. His wife recently had Covid.  Found to be Covid positive.   Clinical Impression   Patient presenting with decreased I in self care, balance, functional mobility, transfer, endurance, and safety awareness.  Patient reports being independent without use of AD PTA and lives at home with wife. Pt very fatigued this session and performing self care tasks while seated on EOB but keep eyes closed during majority of session. Pt performing bed mobility with mod I and self care tasks with supervision/set up A. Pt declined OOB activities but is mod I per PT evaluation.   Patient will benefit from acute OT to increase overall independence in the areas of ADLs, functional mobility,and safety awareness in order to safely discharge home with family.    Follow Up Recommendations  Supervision - Intermittent;Home health OT    Equipment Recommendations  3 in 1 bedside commode       Precautions / Restrictions Precautions Precautions: Fall Restrictions Weight Bearing Restrictions: No      Mobility Bed Mobility Overal bed mobility: Independent      General bed mobility comments: Pt was able to get himself to sitting EOB w/o direct assist    Transfers Overall transfer level: Modified independent Equipment used: Rolling walker (2 wheeled)      General transfer comment: Pt was able to rise to standing w/o direct assist, good confidence and balance    Balance Overall balance assessment: Modified Independent          ADL either performed or assessed with clinical judgement   ADL Overall ADL's : Needs assistance/impaired Eating/Feeding:  Modified independent   Grooming: Wash/dry hands;Wash/dry face;Oral care;Supervision/safety;Set up;Sitting        General ADL Comments: set up A to obtain needed items for self care tasks with min cuing.     Vision Baseline Vision/History: Wears glasses Wears Glasses: At all times Patient Visual Report: No change from baseline              Pertinent Vitals/Pain Pain Assessment: No/denies pain     Hand Dominance Right   Extremity/Trunk Assessment Upper Extremity Assessment Upper Extremity Assessment: Overall WFL for tasks assessed;Generalized weakness   Lower Extremity Assessment Lower Extremity Assessment: Overall WFL for tasks assessed;Generalized weakness       Communication Communication Communication: No difficulties;HOH   Cognition Arousal/Alertness: Awake/alert Behavior During Therapy: WFL for tasks assessed/performed Overall Cognitive Status: Difficult to assess      General Comments: Pt very fatigues and answers questions but keeps eyes closed during majority of eval. Pt follows 1 step command with increased time.   General Comments  Pt with O2 staying in the 90s on room air t/o the effort            [place] expects to be discharged to:: Private residence Living Arrangements: Spouse/significant other Available Help at Discharge: Available 24 hours/day;Family;Friend(s)   Home Access: Stairs to enter Entrance Stairs-Number of Steps: 4 Entrance Stairs-Rails: Right Home Layout: One level    Bathroom Toilet: Ada: Walker - 2 wheels          Prior Functioning/Environment Level of  Independence: Independent        Comments: Pt reports he drives, runs errands, and generally is able to stay pretty active. He says he performs his own self care        OT Problem List: Decreased activity tolerance;Decreased safety awareness;Decreased knowledge of use of DME or AE;Impaired balance (sitting and/or  standing);Decreased knowledge of precautions;Decreased strength      OT Treatment/Interventions: Self-care/ADL training;Therapeutic exercise;Therapeutic activities;DME and/or AE instruction;Patient/family education;Energy conservation;Balance training;Manual therapy    OT Goals(Current goals can be found in the care plan section) Acute Rehab OT Goals Patient Stated Goal: go home OT Goal Formulation: With patient Time For Goal Achievement: 05/02/20 Potential to Achieve Goals: Good ADL Goals Pt Will Perform Grooming: (P) with modified independence;standing Pt Will Transfer to Toilet: (P) with modified independence;ambulating Pt Will Perform Toileting - Clothing Manipulation and hygiene: (P) with modified independence;sit to/from stand  OT Frequency: Min 1X/week   Barriers to D/C:    none known at this time          AM-PAC OT "6 Clicks" Daily Activity     Outcome Measure Help from another person eating meals?: None Help from another person taking care of personal grooming?: A Little Help from another person toileting, which includes using toliet, bedpan, or urinal?: A Little Help from another person bathing (including washing, rinsing, drying)?: A Little Help from another person to put on and taking off regular upper body clothing?: A Little Help from another person to put on and taking off regular lower body clothing?: A Little 6 Click Score: 19   End of Session Nurse Communication: Mobility status;Other (comment) (fatigue)  Activity Tolerance: Patient limited by fatigue;Patient tolerated treatment well Patient left: in bed;with call bell/phone within reach;with bed alarm set  OT Visit Diagnosis: Muscle weakness (generalized) (M62.81)                Time: 4128-7867 OT Time Calculation (min): 20 min Charges:  OT General Charges $OT Visit: 1 Visit OT Evaluation $OT Eval Low Complexity: 1 Low OT Treatments $Self Care/Home Management : 8-22 mins  Darleen Crocker, MS, OTR/L ,  CBIS ascom [phone]  04/18/20, 4:28 PM

## 2020-04-18 NOTE — Care Management Important Message (Signed)
Important Message  Patient Details  Name: Alejandro Armstrong MRN: [id number] Date of Birth: [birth year]   Medicare Important Message Given:  Yes     Shelbie Hutching, RN 04/18/2020, 9:38 AM

## 2020-04-18 NOTE — Plan of Care (Signed)
  Problem: Education: Goal: Knowledge of General Education information will improve Description: Including pain rating scale, medication(s)/side effects and non-pharmacologic comfort measures Outcome: Progressing Note: Pt is hard of hearing and confused at times. Needs frequent education and redirecting    Problem: Clinical Measurements: Goal: Will remain free from infection Outcome: Progressing Goal: Diagnostic test results will improve Outcome: Progressing   Problem: Activity: Goal: Risk for activity intolerance will decrease Outcome: Progressing   Problem: Pain Managment: Goal: General experience of comfort will improve Outcome: Progressing   Problem: Safety: Goal: Ability to remain free from injury will improve Outcome: Progressing Note: Pt is impulsive and can be agitated at times    Problem: Respiratory: Goal: Will maintain a patent airway Outcome: Progressing Goal: Complications related to the disease process, condition or treatment will be avoided or minimized Outcome: Progressing   Problem: Education: Goal: Knowledge of risk factors and measures for prevention of condition will improve Outcome: Progressing   Problem: Respiratory: Goal: Will maintain a patent airway Outcome: Progressing

## 2020-04-19 LAB — GLUCOSE, CAPILLARY
Glucose-Capillary: 170 mg/dL — ABNORMAL HIGH (ref 70–99)
Glucose-Capillary: 314 mg/dL — ABNORMAL HIGH (ref 70–99)
Glucose-Capillary: 295 mg/dL — ABNORMAL HIGH (ref 70–99)
Glucose-Capillary: 277 mg/dL — ABNORMAL HIGH (ref 70–99)

## 2020-04-19 LAB — BASIC METABOLIC PANEL
Anion gap: 13 (ref 5–15)
BUN: 64 mg/dL — ABNORMAL HIGH (ref 8–23)
CO2: 24 mmol/L (ref 22–32)
Calcium: 9.5 mg/dL (ref 8.9–10.3)
Chloride: 98 mmol/L (ref 98–111)
Creatinine, Ser: 6.03 mg/dL — ABNORMAL HIGH (ref 0.61–1.24)
GFR, Estimated: 8 mL/min — ABNORMAL LOW (ref >60)
Glucose, Bld: 255 mg/dL — ABNORMAL HIGH (ref 70–99)
Potassium: 4.3 mmol/L (ref 3.5–5.1)
Sodium: 135 mmol/L (ref 135–145)

## 2020-04-19 LAB — CBC
HCT: 28.4 % — ABNORMAL LOW (ref 39.0–52.0)
Hemoglobin: 9.5 g/dL — ABNORMAL LOW (ref 13.0–17.0)
MCH: 32.5 pg (ref 26.0–34.0)
MCHC: 33.5 g/dL (ref 30.0–36.0)
MCV: 97.3 fL (ref 80.0–100.0)
Platelets: 143 10*3/uL — ABNORMAL LOW (ref 150–400)
RBC: 2.92 MIL/uL — ABNORMAL LOW (ref 4.22–5.81)
RDW: 14.2 % (ref 11.5–15.5)
WBC: 8.2 10*3/uL (ref 4.0–10.5)
nRBC: 0 % (ref 0.0–0.2)

## 2020-04-19 MED ORDER — METOPROLOL TARTRATE 25 MG PO TABS: 12.5000 mg | ORAL_TABLET | Freq: Two times a day (BID) | ORAL | 0 refills | Status: AC

## 2020-04-19 MED ORDER — PREDNISONE 20 MG PO TABS: ORAL_TABLET | ORAL | 0 refills | Status: AC

## 2020-04-19 MED ORDER — ZINC SULFATE 220 (50 ZN) MG PO CAPS: 220.0000 mg | ORAL_CAPSULE | Freq: Every day | ORAL | 0 refills | Status: DC

## 2020-04-19 MED ORDER — ALBUTEROL SULFATE HFA 108 (90 BASE) MCG/ACT IN AERS: 2.0000 | INHALATION_SPRAY | Freq: Four times a day (QID) | RESPIRATORY_TRACT | 0 refills | Status: AC

## 2020-04-19 MED ORDER — ASCORBIC ACID 500 MG PO TABS: 500.0000 mg | ORAL_TABLET | Freq: Every day | ORAL | 0 refills | Status: DC

## 2020-04-19 MED ORDER — CALCIUM ACETATE (PHOS BINDER) 667 MG PO CAPS: 1334.0000 mg | ORAL_CAPSULE | Freq: Three times a day (TID) | ORAL | 0 refills | Status: AC

## 2020-04-19 MED ORDER — HYDRALAZINE HCL 20 MG/ML IJ SOLN
10.0000 mg | Freq: Four times a day (QID) | INTRAMUSCULAR | Status: DC | PRN
  Administered 2020-04-19 – 2020-04-20 (×2): 10 mg via INTRAVENOUS
  Filled 2020-04-19 (×2): qty 1

## 2020-04-19 NOTE — Progress Notes (Signed)
[facility]  ROUNDING NOTE   Subjective:   Hemodialysis for later today.   Objective:  Vital signs in last 24 hours:  Temp:  [97.5 F (36.4 C)-98.8 F (37.1 C)] 97.5 F (36.4 C) (11/04 1214) Pulse Rate:  [46-82] 82 (11/04 1214) Resp:  [16-18] 17 (11/04 1214) BP: (130-182)/(59-77) 130/77 (11/04 1214) SpO2:  [96 %-100 %] 100 % (11/04 1214)  Weight change:  Filed Weights   04/12/20 1441  Weight: 72.6 kg    Intake/Output: I/O last 3 completed shifts: In: -  Out: 400 [Urine:400]   Intake/Output this shift:  No intake/output data recorded.  Physical Exam: General: Resting in bed, in no acute distress  Head: Normocephalic, Atraumatic  Eyes: Anicteric  Lungs:  Lungs clear bilaterally  Heart: S1S2,no rubs or gallops  Abdomen:  Soft, nontender, non distended  Extremities:  No  peripheral edema.  Neurologic: Alert, awake, answers questions appropriately , very hard of hearing  Skin: No lesions or rashes noted  Access: Rt IJ catheter,LUA AVF no thrill or bruit    Basic Metabolic Panel: Recent Labs  Lab 04/12/20 1740 04/13/20 0646 04/14/20 2356 04/14/20 2356 04/16/20 0856 04/16/20 0856 04/17/20 0553 04/18/20 0553 04/19/20 0413  NA  --    < > 134*  --  132*  --  136 140 135  K  --    < > 5.6*  --  6.0*  --  4.6 3.7 4.3  CL  --    < > 103  --  101  --  100 101 98  CO2  --    < > 17*  --  17*  --  23 28 24   GLUCOSE  --    < > 202*  --  217*  --  202* 161* 255*  BUN  --    < > 80*  --  102*  --  57* 40* 64*  CREATININE  --    < > 10.49*  --  10.83*  --  6.90* 4.78* 6.03*  CALCIUM  --    < > 9.0   < > 8.7*   < > 8.5* 8.8* 9.5  PHOS 4.1  --  6.8*  --   --   --   --   --   --    < > = values in this interval not displayed.    Liver Function Tests: Recent Labs  Lab 04/13/20 0646 04/14/20 0432 04/14/20 2356 04/16/20 0856 04/17/20 0553  AST 21 30 28 22 23   ALT 13 15 16 20 22   ALKPHOS 84 81 69 78 63  BILITOT 0.9 0.8 0.7 1.0 0.8  PROT 7.5 7.3 6.5  6.9 6.1*  ALBUMIN 3.6 3.6 3.3* 3.5 3.0*   No results for input(s): LIPASE, AMYLASE in the last 168 hours. No results for input(s): AMMONIA in the last 168 hours.  CBC: Recent Labs  Lab 04/13/20 0646 04/13/20 0646 04/14/20 0432 04/14/20 0432 04/14/20 2356 04/16/20 0856 04/17/20 0553 04/18/20 0553 04/19/20 0413  WBC 2.8*   < > 4.8   < > 5.7 6.0 7.2 7.8 8.2  NEUTROABS 2.2  --  3.4  --  4.5 4.6 5.1  --   --   HGB 9.7*   < > 9.7*   < > 8.9* 9.5* 9.1* 9.4* 9.5*  HCT 29.0*   < > 30.4*   < > 26.2* 28.8* 26.6* 27.6* 28.4*  MCV 98.3   < > 102.7*   < > 97.8 98.3  96.7 97.5 97.3  PLT 130*   < > 157   < > 166 177 158 146* 143*   < > = values in this interval not displayed.    Cardiac Enzymes: No results for input(s): CKTOTAL, CKMB, CKMBINDEX, TROPONINI in the last 168 hours.  BNP: Invalid input(s): POCBNP  CBG: Recent Labs  Lab 04/18/20 1216 04/18/20 1635 04/18/20 2040 04/19/20 0720 04/19/20 1212  GLUCAP 191* 257* 232* 170* 314*    Microbiology: Results for orders placed or performed during the hospital encounter of 04/12/20  Respiratory Panel by RT PCR (Flu A&B, Covid) - Nasopharyngeal Swab     Status: Abnormal   Collection Time: 04/12/20  3:56 PM   Specimen: Nasopharyngeal Swab  Result Value Ref Range Status   SARS Coronavirus 2 by RT PCR POSITIVE (A) NEGATIVE Final    Comment: RESULT CALLED TO, READ BACK BY AND VERIFIED WITH: [facility] REGISTER AT 4193 04/12/2020 BY MOSLEY,J (NOTE) SARS-CoV-2 target nucleic acids are DETECTED.  SARS-CoV-2 RNA is generally detectable in upper respiratory specimens  during the acute phase of infection. Positive results are indicative of the presence of the identified virus, but do not rule out bacterial infection or co-infection with other pathogens not detected by the test. Clinical correlation with patient history and other diagnostic information is necessary to determine patient infection status. The expected result is Negative.  Fact  Sheet for Patients:  PinkCheek.be  Fact Sheet for Healthcare Providers: GravelBags.it  This test is not yet approved or cleared by the [place] FDA and  has been authorized for detection and/or diagnosis of SARS-CoV-2 by FDA under an Emergency Use Authorization (EUA).  This EUA will remain in effect (meaning this te st can be used) for the duration of  the COVID-19 declaration under Section 564(b)(1) of the Act, 21 U.S.C. section 360bbb-3(b)(1), unless the authorization is terminated or revoked sooner.      Influenza A by PCR NEGATIVE NEGATIVE Final   Influenza B by PCR NEGATIVE NEGATIVE Final    Comment: (NOTE) The Xpert Xpress SARS-CoV-2/FLU/RSV assay is intended as an aid in  the diagnosis of influenza from Nasopharyngeal swab specimens and  should not be used as a sole basis for treatment. Nasal washings and  aspirates are unacceptable for Xpert Xpress SARS-CoV-2/FLU/RSV  testing.  Fact Sheet for Patients: PinkCheek.be  Fact Sheet for Healthcare Providers: GravelBags.it  This test is not yet approved or cleared by the [place] FDA and  has been authorized for detection and/or diagnosis of SARS-CoV-2 by  FDA under an Emergency Use Authorization (EUA). This EUA will remain  in effect (meaning this test can be used) for the duration of the  Covid-19 declaration under Section 564(b)(1) of the Act, 21  U.S.C. section 360bbb-3(b)(1), unless the authorization is  terminated or revoked. Performed at [facility], [address] [place], [place] [address]   MRSA PCR Screening     Status: None   Collection Time: 04/18/20  3:37 PM   Specimen: Nasopharyngeal  Result Value Ref Range Status   MRSA by PCR NEGATIVE NEGATIVE Final    Comment:        The GeneXpert MRSA Assay (FDA approved for NASAL specimens only), is one component of  a comprehensive MRSA colonization surveillance program. It is not intended to diagnose MRSA infection nor to guide or monitor treatment for MRSA infections. Performed at [facility], [address], [place], [place] [address]     Coagulation Studies: No results for  input(s): LABPROT, INR in the last 72 hours.  Urinalysis: No results for input(s): COLORURINE, LABSPEC, PHURINE, GLUCOSEU, HGBUR, BILIRUBINUR, KETONESUR, PROTEINUR, UROBILINOGEN, NITRITE, LEUKOCYTESUR in the last 72 hours.  Invalid input(s): APPERANCEUR    Imaging: No results found.   Medications:   . sodium chloride    . sodium chloride     . albuterol  2 puff Inhalation Q6H  . vitamin C  500 mg Oral Daily  . aspirin EC  81 mg Oral Daily  . atorvastatin  10 mg Oral Daily  . calcium acetate  1,334 mg Oral TID WC  . Chlorhexidine Gluconate Cloth  6 each Topical Q0600  . epoetin (EPOGEN/PROCRIT) injection  10,000 Units Intravenous Q T,Th,Sa-HD  . heparin  5,000 Units Subcutaneous Q8H  . insulin aspart  0-6 Units Subcutaneous TID WC  . insulin aspart  2 Units Subcutaneous TID WC  . levothyroxine  100 mcg Oral Q0600  . loratadine  10 mg Oral Daily  . metoprolol tartrate  12.5 mg Oral BID  . multivitamin  1 tablet Oral Daily  . omega-3 acid ethyl esters  2 g Oral BID  . polyethylene glycol  17 g Oral Daily  . predniSONE  40 mg Oral Q breakfast  . zinc sulfate  220 mg Oral Daily   sodium chloride, sodium chloride, acetaminophen, alteplase, guaiFENesin-dextromethorphan, heparin, hydrALAZINE, lidocaine (PF), lidocaine-prilocaine, lidocaine-prilocaine, ondansetron **OR** ondansetron (ZOFRAN) IV, ondansetron (ZOFRAN) IV, pentafluoroprop-tetrafluoroeth, polyvinyl alcohol  Assessment/ Plan:  Alejandro Armstrong is a 86 y.o. white male with end stage renal disease on hemodialysis, hypertension, diabetes mellitus type II, hypothyroidism, idiopathic thormbocytopenia purpura, and nephrolithiasis who was  admitted to [facility] on .04/12/2020  For Hyperkalemia [E87.5] ESRD (end stage renal disease) on dialysis ([place] [place] [facility]) [N18.6, Z99.2] Generalized weakness [R53.1] COVID [U07.1]  [facility] M[facility] [place] RT IJ catheter, Left AVF 73 kg  1. End stage renal disease with hyperkalemia:  Hemodialysis for later today.   2. Anemia of chronic kidney disease - EPO with HD treatment  3. Hypertension:  - metoprolol  4. Secondary Hyperparathyroidism: - restarted calcium acetate.    LOS: 7 Nakaiya Beddow 11/4/20212:04 PM

## 2020-04-19 NOTE — Discharge Summary (Signed)
Physician Discharge Summary  Patient ID: Alejandro Armstrong MRN: [id number] DOB/AGE: 08/20/1929 86 y.o.  Admit date: 04/12/2020 Discharge date: 04/19/2020  Admission Diagnoses:  Discharge Diagnoses:  Active Problems:   Hyperkalemia   ESRD (end stage renal disease) on dialysis [facility])   Essential hypertension   Hypothyroidism   Pneumonia due to COVID-19 virus   Acute hypoxemic respiratory failure [facility])   Discharged Condition: good  Hospital Course:  [name] a86 y.o.malewith a known history of end-stage renal disease has not had dialysis since Friday. He has not been feeling well. He has been feeling tired and fatigued. His wife recently had Covid. He has not felt well enough to go to dialysis on Monday or Wednesday. he was Tested Covid positive on 10/27. [name]. Slight shortness of breath. In the ER, he was found to be hyperkalemic with a potassium of 6.8. Hospitalist services were contacted for further evaluation. Patient was tested positive for Covid, he developed acute hypoxemia respiratory failure, was initially on 2 L oxygen.  He received partial dose of remdesivir and steroids.  Oxygenation had improved so far. He also developed paroxysmal atrial fibrillation, spontaneously converted to sinus. He was also seen by nephrology for hyperkalemia with end-stage renal disease.  His AV fistula was not working, a Engineer, manufacturing was placed.  11/4.  Patient condition had improved, currently he is off oxygen, no segment short of breath.  He has been evaluated by physical therapy and Occupational Therapy, recommended home with home care, home physical therapy and Occupational Therapy.  Patient is medically stable to be discharged.   #1.  Pneumonia secondary to COVID-19 infection, acute respiratory failure with hypoxemia. Patient condition seem to be improved.  He is off oxygen.  Continue oral steroids. There is some concern about patient  confusion, PT OT has not evaluated patient.  Based on my exam, patient has significant hearing loss, he is disoriented to time. I will hold the discharge for another day to determine if patient is appropriate to go home.  #2.  Paroxysmal atrial fibrillation. TSH is still within normal limits.  Patient has converted to sinus rhythm.  No anticoagulation per cardiology recommendation.  3.  End-stage renal disease on dialysis. Followed by nephrology.  Dialysis schedule has changed to TTS.  #4. Essential hypertension. Continue home medicine.  5.  Hypothyroidism.  Continue current treatment.  6.  Uncontrolled type 2 diabetes with hyperglycemia. Continue current regimen.    Consults: None  Significant Diagnostic Studies:  PORTABLE CHEST 1 VIEW  COMPARISON:  Nov 04, 2019  FINDINGS: Stable cardiomediastinal silhouette. Subtle opacities in the left lateral lung base. No visible pleural effusions or pneumothorax. Hiatal hernia. Vascular stents in the left subclavian left axillary regions.  IMPRESSION: Subtle opacities in the left lateral lung base, which may represent early pneumonia given reported COVID diagnosis.   Electronically Signed   By: Margaretha Sheffield MD   On: 04/12/2020 15:53    Treatments: Remdesivir, steroids.  Discharge Exam: Blood pressure (!) 130/59, pulse (!) 58, temperature 97.8 F (36.6 C), temperature source Oral, resp. rate 17, height 5' 7.5" (1.715 m), weight 72.6 kg, SpO2 98 %. General appearance: alert, cooperative and Oriented to place and person. Resp: clear to auscultation bilaterally Cardio: regular rate and rhythm, S1, S2 normal, no murmur, click, rub or gallop GI: soft, non-tender; bowel sounds normal; no masses,  no organomegaly Extremities: extremities normal, atraumatic, no cyanosis or edema  Disposition: Discharge disposition: 01-Home or Self Care  Discharge Instructions    Diet - low sodium heart healthy    Complete by: As directed    Discharge wound care:   Complete by: As directed    Follow with RN home care   Increase activity slowly   Complete by: As directed      Allergies as of 04/19/2020   No Known Allergies     Medication List    STOP taking these medications   amLODipine 5 MG tablet Commonly known as: NORVASC     TAKE these medications   acetaminophen 500 MG tablet Commonly known as: TYLENOL Take 1,000 mg by mouth every 6 (six) hours as needed for moderate pain or headache.   albuterol 108 (90 Base) MCG/ACT inhaler Commonly known as: VENTOLIN HFA Inhale 2 puffs into the lungs every 6 (six) hours.   ascorbic acid 500 MG tablet Commonly known as: VITAMIN C Take 1 tablet (500 mg total) by mouth daily for 14 days. Start taking on: April 20, 2020   aspirin 81 MG EC tablet Take 1 tablet (81 mg total) by mouth daily.   atorvastatin 10 MG tablet Commonly known as: LIPITOR Take 10 mg by mouth daily.   calcium acetate 667 MG capsule Commonly known as: PHOSLO Take 2 capsules (1,334 mg total) by mouth 3 (three) times daily with meals.   cetirizine 10 MG tablet Commonly known as: ZYRTEC Take 10 mg by mouth daily.   furosemide 40 MG tablet Commonly known as: LASIX Take 40 mg by mouth 2 (two) times daily.   hydrALAZINE 25 MG tablet Commonly known as: APRESOLINE Take 25 mg by mouth 2 (two) times daily.   levothyroxine 125 MCG tablet Commonly known as: SYNTHROID Take 125 mcg by mouth daily before breakfast.   lidocaine-prilocaine cream Commonly known as: EMLA Apply 1 application topically daily as needed (port access).   lisinopril 20 MG tablet Commonly known as: ZESTRIL Take 20 mg by mouth daily.   metoprolol tartrate 25 MG tablet Commonly known as: LOPRESSOR Take 0.5 tablets (12.5 mg total) by mouth 2 (two) times daily.   Omega-3 1000 MG Caps Take 2,000 mg by mouth 2 (two) times daily.   predniSONE 20 MG tablet Commonly known as: DELTASONE Take 2  tablets (40 mg total) by mouth daily with breakfast for 3 days, THEN 1 tablet (20 mg total) daily with breakfast for 3 days, THEN 0.5 tablets (10 mg total) daily with breakfast for 3 days. Start taking on: April 20, 2020   RENA-VITE PO Take 1 tablet by mouth daily.   Theratears 0.25 % Soln Generic drug: Carboxymethylcellulose Sodium Place 1-2 drops into both eyes 3 (three) times daily as needed (for dry eyes).   Vitamin D3 1.25 MG (50000 UT) Caps Take 50,000 Units by mouth every Wednesday.   zinc sulfate 220 (50 Zn) MG capsule Take 1 capsule (220 mg total) by mouth daily for 14 days. Start taking on: April 20, 2020            Discharge Care Instructions  (From admission, onward)         Start     Ordered   04/19/20 0000  Discharge wound care:       Comments: Follow with RN home care   04/19/20 1019          Follow-up Information    Casilda Carls, MD Follow up in 1 week(s).   Specialty: Internal Medicine Contact information: [address] [place] [address] [phone]  35 minutes Signed: Sharen Hones 04/19/2020, 10:20 AM

## 2020-04-19 NOTE — Plan of Care (Signed)
No adverse events this shift.  Pt was Alejandro Armstrong, but when checked, he roused and sd he had no needs.  Possible d/c today w/Home Health after dialysis.  He is still very impulsive and will get out of bed to use BR w/out waiting for assistance.

## 2020-04-19 NOTE — Discharge Instructions (Signed)
10 Things You Can Do to Manage Your COVID-19 Symptoms at Home If you have possible or confirmed COVID-19: 1. Stay home from work and school. And stay away from other public places. If you must go out, avoid using any kind of public transportation, ridesharing, or taxis. 2. Monitor your symptoms carefully. If your symptoms get worse, call your healthcare provider immediately. 3. Get rest and stay hydrated. 4. If you have a medical appointment, call the healthcare provider ahead of time and tell them that you have or may have COVID-19. 5. For medical emergencies, call 911 and notify the dispatch personnel that you have or may have COVID-19. 6. Cover your cough and sneezes with a tissue or use the inside of your elbow. 7. Wash your hands often with soap and water for at least 20 seconds or clean your hands with an alcohol-based hand sanitizer that contains at least 60% alcohol. 8. As much as possible, stay in a specific room and away from other people in your home. Also, you should use a separate bathroom, if available. If you need to be around other people in or outside of the home, wear a mask. 9. Avoid sharing personal items with other people in your household, like dishes, towels, and bedding. 10. Clean all surfaces that are touched often, like counters, tabletops, and doorknobs. Use household cleaning sprays or wipes according to the label instructions. cdc.gov/coronavirus 12/15/2018 This information is not intended to replace advice given to you by your health care provider. Make sure you discuss any questions you have with your health care provider. Document Revised: 05/19/2019 Document Reviewed: 05/19/2019 Elsevier Patient Education  2020 Elsevier Inc.  

## 2020-04-19 NOTE — TOC Transition Note (Signed)
Transition of Care [facility]) - CM/SW Discharge Note   Patient Details  Name: Alejandro Armstrong MRN: [id number] Date of Birth: 28-Aug-1929  Transition of Care [facility]) CM/SW Contact:  Shelbie Hutching, RN Phone Number: 04/19/2020, 10:49 AM   Clinical Narrative:    Patient medically cleared for discharge home with home health, RN, PT, and OT.  [place] with [facility] aware of discharge today.  Elvera Bicker with [facility] also notified of patient discharge today.  Wife should be picking patient up today.    Final next level of care: Home w Home Health Services Barriers to Discharge: Barriers Resolved   Patient Goals and CMS Choice   CMS Medicare.gov Compare Post Acute Care list provided to:: Patient Choice offered to / list presented to : Patient  Discharge Placement                       Discharge Plan and Services   Discharge Planning Services: CM Consult Post Acute Care Choice: Home Health                    HH Arranged: RN, PT, OT [facility] Agency: [facility] Date [place]: 04/19/20 Time [place]: 4401 Representative spoke with at [place]: [name] ([place]) Interventions     Readmission Risk Interventions Readmission Risk Prevention Plan 04/18/2020  Transportation Screening Complete  PCP or Specialist Appt within 3-5 Days Complete  HRI or [place] Complete  Social Work Consult for [place] Planning/Counseling Complete  Palliative Care Screening Not Applicable  Medication Review Press photographer) Complete  Some recent data might be hidden

## 2020-04-19 NOTE — Progress Notes (Signed)
Pt being discharged home today after HD, discharge instructions reviewed with pt and wife, states understanding, pt with no complaints

## 2020-04-20 ENCOUNTER — Other Ambulatory Visit: Payer: Medicare HMO

## 2020-04-20 DIAGNOSIS — J9601 Acute respiratory failure with hypoxia: Secondary | ICD-10-CM

## 2020-04-20 LAB — CBC
HCT: 28.4 % — ABNORMAL LOW (ref 39.0–52.0)
Hemoglobin: 9.6 g/dL — ABNORMAL LOW (ref 13.0–17.0)
MCH: 32.4 pg (ref 26.0–34.0)
MCHC: 33.8 g/dL (ref 30.0–36.0)
MCV: 95.9 fL (ref 80.0–100.0)
Platelets: 146 10*3/uL — ABNORMAL LOW (ref 150–400)
RBC: 2.96 MIL/uL — ABNORMAL LOW (ref 4.22–5.81)
RDW: 14.3 % (ref 11.5–15.5)
WBC: 10.5 10*3/uL (ref 4.0–10.5)
nRBC: 0 % (ref 0.0–0.2)

## 2020-04-20 LAB — GLUCOSE, CAPILLARY
Glucose-Capillary: 180 mg/dL — ABNORMAL HIGH (ref 70–99)
Glucose-Capillary: 198 mg/dL — ABNORMAL HIGH (ref 70–99)
Glucose-Capillary: 195 mg/dL — ABNORMAL HIGH (ref 70–99)

## 2020-04-20 LAB — BASIC METABOLIC PANEL
Anion gap: 13 (ref 5–15)
BUN: 78 mg/dL — ABNORMAL HIGH (ref 8–23)
CO2: 23 mmol/L (ref 22–32)
Calcium: 10.3 mg/dL (ref 8.9–10.3)
Chloride: 99 mmol/L (ref 98–111)
Creatinine, Ser: 7.09 mg/dL — ABNORMAL HIGH (ref 0.61–1.24)
GFR, Estimated: 7 mL/min — ABNORMAL LOW (ref >60)
Glucose, Bld: 183 mg/dL — ABNORMAL HIGH (ref 70–99)
Potassium: 4.5 mmol/L (ref 3.5–5.1)
Sodium: 135 mmol/L (ref 135–145)

## 2020-04-20 NOTE — Discharge Summary (Signed)
Physician Discharge Summary  Patient ID: Alejandro Armstrong MRN: [id number] DOB/AGE: 1930/06/05 86 y.o.  Admit date: 04/12/2020 Discharge date: 04/20/2020  Admission Diagnoses:  Discharge Diagnoses:  Active Problems:   Hyperkalemia   ESRD (end stage renal disease) on dialysis [facility])   Essential hypertension   Hypothyroidism   Pneumonia due to COVID-19 virus   Acute hypoxemic respiratory failure [facility])   Discharged Condition: good  Hospital Course:  [name] has not had dialysis since Friday. He has not been feeling well. He has been feeling tired and fatigued. His wife recently had Covid. He has not felt well enough to go to dialysis on Monday or Wednesday. he was Tested Covid positive on 10/27. [name]. Slight shortness of breath. In the ER, he was found to be hyperkalemic with a potassium of 6.8. Hospitalist services were contacted for further evaluation. Patient was testedpositive for Covid, he developed acute hypoxemia respiratory failure, was initially on 2 L oxygen. He received partial dose of remdesivir and steroids. Oxygenation had improved so far. He also developed paroxysmal atrial fibrillation, spontaneously converted to sinus. He was also seen by nephrology for hyperkalemia with end-stage renal disease. His AV fistula was not working, a Engineer, manufacturing was placed.  11/4.  Patient condition had improved, currently he is off oxygen, no segment short of breath.  He has been evaluated by physical therapy and Occupational Therapy, recommended home with home care, home physical therapy and Occupational Therapy.  Patient is medically stable to be discharged.   #1. Pneumonia secondary to COVID-19 infection, acute respiratory failure with hypoxemia. Patient condition seem to be improved. He is off oxygen.  Continue oral steroids. There is some concern about patient  confusion, PT OT has not evaluated patient. Based on my exam, patient has significant hearing loss, he is disoriented to time. I will hold the discharge for another day to determine if patient is appropriate to go home.  #2. Paroxysmal atrial fibrillation. TSH is still within normal limits. Patient has converted to sinus rhythm. No anticoagulation per cardiology recommendation.  3. End-stage renal disease on dialysis. Followed by nephrology. Dialysis schedule has changed to TTS.  #4. Essential hypertension. Continue home medicine.  5. Hypothyroidism.  Continue current treatment.  6. Uncontrolled type 2 diabetes with hyperglycemia. Continue current regimen.  Patient was scheduled to be discharged on 11/4.  Nephrology recommended hold off for another day as patient needs dialysis today.  Patient is still medically stable to be discharged.   Consults: nephrology  Significant Diagnostic Studies:  PORTABLE CHEST 1 VIEW  COMPARISON: Nov 04, 2019  FINDINGS: Stable cardiomediastinal silhouette. Subtle opacities in the left lateral lung base. No visible pleural effusions or pneumothorax. Hiatal hernia. Vascular stents in the left subclavian left axillary regions.  IMPRESSION: Subtle opacities in the left lateral lung base, which may represent early pneumonia given reported COVID diagnosis.   Electronically Signed By: Margaretha Sheffield MD On: 04/12/2020 15:53  Treatments: Remdesivir and steroids.  Discharge Exam: Blood pressure (!) 193/83, pulse (!) 47, temperature 98.1 F (36.7 C), resp. rate 16, height 5' 7.5" (1.715 m), weight 72.6 kg, SpO2 100 %. General appearance: alert, cooperative and Oriented to time place and person. Resp: clear to auscultation bilaterally Cardio: regular rate and rhythm, S1, S2 normal, no murmur, click, rub or gallop GI: soft, non-tender; bowel sounds normal; no masses,  no organomegaly Extremities: extremities normal,  atraumatic, no cyanosis or edema  Disposition: Discharge disposition: 01-Home or Self Care       Discharge Instructions    Diet - low sodium heart healthy   Complete by: As directed    Diet - low sodium heart healthy   Complete by: As directed    Discharge wound care:   Complete by: As directed    Follow with RN home care   Discharge wound care:   Complete by: As directed    Dressing change by home care RN   Increase activity slowly   Complete by: As directed    Increase activity slowly   Complete by: As directed      Allergies as of 04/20/2020   No Known Allergies     Medication List    STOP taking these medications   amLODipine 5 MG tablet Commonly known as: NORVASC     TAKE these medications   acetaminophen 500 MG tablet Commonly known as: TYLENOL Take 1,000 mg by mouth every 6 (six) hours as needed for moderate pain or headache.   albuterol 108 (90 Base) MCG/ACT inhaler Commonly known as: VENTOLIN HFA Inhale 2 puffs into the lungs every 6 (six) hours.   ascorbic acid 500 MG tablet Commonly known as: VITAMIN C Take 1 tablet (500 mg total) by mouth daily for 14 days.   aspirin 81 MG EC tablet Take 1 tablet (81 mg total) by mouth daily.   atorvastatin 10 MG tablet Commonly known as: LIPITOR Take 10 mg by mouth daily.   calcium acetate 667 MG capsule Commonly known as: PHOSLO Take 2 capsules (1,334 mg total) by mouth 3 (three) times daily with meals.   cetirizine 10 MG tablet Commonly known as: ZYRTEC Take 10 mg by mouth daily.   furosemide 40 MG tablet Commonly known as: LASIX Take 40 mg by mouth 2 (two) times daily.   hydrALAZINE 25 MG tablet Commonly known as: APRESOLINE Take 25 mg by mouth 2 (two) times daily.   levothyroxine 125 MCG tablet Commonly known as: SYNTHROID Take 125 mcg by mouth daily before breakfast.   lidocaine-prilocaine cream Commonly known as: EMLA Apply 1 application topically daily as needed (port access).    lisinopril 20 MG tablet Commonly known as: ZESTRIL Take 20 mg by mouth daily.   metoprolol tartrate 25 MG tablet Commonly known as: LOPRESSOR Take 0.5 tablets (12.5 mg total) by mouth 2 (two) times daily.   Omega-3 1000 MG Caps Take 2,000 mg by mouth 2 (two) times daily.   predniSONE 20 MG tablet Commonly known as: DELTASONE Take 2 tablets (40 mg total) by mouth daily with breakfast for 3 days, THEN 1 tablet (20 mg total) daily with breakfast for 3 days, THEN 0.5 tablets (10 mg total) daily with breakfast for 3 days. Start taking on: April 20, 2020   RENA-VITE PO Take 1 tablet by mouth daily.   Theratears 0.25 % Soln Generic drug: Carboxymethylcellulose Sodium Place 1-2 drops into both eyes 3 (three) times daily as needed (for dry eyes).   Vitamin D3 1.25 MG (50000 UT) Caps Take 50,000 Units by mouth every Wednesday.   zinc sulfate 220 (50 Zn) MG capsule Take 1 capsule (220 mg total) by mouth daily for 14 days.            Discharge Care Instructions  (From admission, onward)         Start     Ordered   04/20/20 0000  Discharge wound care:       Comments: Dressing  change by home care RN   04/20/20 0850   04/19/20 0000  Discharge wound care:       Comments: Follow with RN home care   04/19/20 1019          Follow-up Information    Casilda Carls, MD. Go on 05/03/2020.   Specialty: Internal Medicine Why: @10 :30 AM  Contact information: [place] [address] [phone]               Signed: Sharen Hones 04/20/2020, 8:51 AM

## 2020-04-20 NOTE — Progress Notes (Signed)
Daily update given to sister

## 2020-04-20 NOTE — Progress Notes (Signed)
Slight change in plans for dialysis outpatient cohort unit. Patient will need to go to [facility] tomorrow 11/6 at 2:30pm. Then will resume cohort schedule at [facility] starting Tuesday 11/9 9:00am. Patient wife and sister are aware of this plan, patient was given notice in witting of this plan also.

## 2020-04-20 NOTE — Progress Notes (Signed)
Sister called back, given another update

## 2020-04-20 NOTE — Progress Notes (Signed)
Inpatient Diabetes Program Recommendations  AACE/ADA: New Consensus Statement on Inpatient Glycemic Control (2015)  Target Ranges:  Prepandial:   less than 140 mg/dL      Peak postprandial:   less than 180 mg/dL (1-2 hours)      Critically ill patients:  140 - 180 mg/dL   Results for CELIA, GIBBONS (MRN [id number]) as of 04/20/2020 07:47  Ref. Range 04/19/2020 07:20 04/19/2020 12:12 04/19/2020 15:38 04/19/2020 20:33  Glucose-Capillary Latest Ref Range: 70 - 99 mg/dL 170 (H)  3 units NOVOLOG  40 mg Prednisone   314 (H)  6 units NOVOLOG   295 (H)  6 units NOVOLOG  277 (H)    Home DM Meds: None; diet controlled  Current Orders: Novolog 0-6 units TID AC      Novolog 2 units TID with meals     MD- Note patient getting Prednisone 40 mg Daily.  CBGs in the afternoon are >200  May consider increasing the Novolog Meal Coverage to 4 units TID with meals while patient remains on Prednisone    --Will follow patient during hospitalization--  Wyn Quaker RN, MSN, CDE Diabetes Coordinator Inpatient Glycemic Control Team Team Pager: [phone] (8a-5p)

## 2020-04-20 NOTE — Progress Notes (Signed)
Pt being discharged, discharge instructions and HD schedule reviewed with pt and sister in law [facility], both state understanding, pt with no complaints

## 2020-04-20 NOTE — Progress Notes (Addendum)
Patient discharged at 2105  home with home health, picked up by neighbor, confirmed with sister in law [place]. No complaints, VSS.

## 2020-04-20 NOTE — Progress Notes (Signed)
[facility]  ROUNDING NOTE   Subjective:   Patient awake and alert.Hospitalist team planning to discharge patient after dialysis today.   Objective:  Vital signs in last 24 hours:  Temp:  [97.5 F (36.4 C)-98.1 F (36.7 C)] 98.1 F (36.7 C) (11/05 1156) Pulse Rate:  [47-82] 51 (11/05 1156) Resp:  [16-20] 20 (11/05 1156) BP: (129-193)/(64-84) 129/64 (11/05 1156) SpO2:  [96 %-100 %] 96 % (11/05 1156)  Weight change:  Filed Weights   04/12/20 1441  Weight: 72.6 kg    Intake/Output: I/O last 3 completed shifts: In: -  Out: 400 [Urine:400]   Intake/Output this shift:  No intake/output data recorded.  Physical Exam: General: In no acute distress  Head: Oral mucous membranes moist  Eyes: Anicteric  Lungs:  Normal effort, Lungs clear bilaterally  Heart: Regular  Abdomen:  Soft, nontender, non distended  Extremities:  No  peripheral edema.  Neurologic: Alert, awake, oriented,HOH  Skin: No lesions or rashes noted  Access: Rt IJ catheter,LUA AVF no thrill or bruit    Basic Metabolic Panel: Recent Labs  Lab 04/14/20 2356 04/14/20 2356 04/16/20 0856 04/16/20 0856 04/17/20 0553 04/17/20 0553 04/18/20 0553 04/19/20 0413 04/20/20 0523  NA 134*   < > 132*  --  136  --  140 135 135  K 5.6*   < > 6.0*  --  4.6  --  3.7 4.3 4.5  CL 103   < > 101  --  100  --  101 98 99  CO2 17*   < > 17*  --  23  --  28 24 23   GLUCOSE 202*   < > 217*  --  202*  --  161* 255* 183*  BUN 80*   < > 102*  --  57*  --  40* 64* 78*  CREATININE 10.49*   < > 10.83*  --  6.90*  --  4.78* 6.03* 7.09*  CALCIUM 9.0   < > 8.7*   < > 8.5*   < > 8.8* 9.5 10.3  PHOS 6.8*  --   --   --   --   --   --   --   --    < > = values in this interval not displayed.    Liver Function Tests: Recent Labs  Lab 04/14/20 0432 04/14/20 2356 04/16/20 0856 04/17/20 0553  AST 30 28 22 23   ALT 15 16 20 22   ALKPHOS 81 69 78 63  BILITOT 0.8 0.7 1.0 0.8  PROT 7.3 6.5 6.9 6.1*  ALBUMIN 3.6 3.3* 3.5  3.0*   No results for input(s): LIPASE, AMYLASE in the last 168 hours. No results for input(s): AMMONIA in the last 168 hours.  CBC: Recent Labs  Lab 04/14/20 0432 04/14/20 0432 04/14/20 2356 04/14/20 2356 04/16/20 0856 04/17/20 0553 04/18/20 0553 04/19/20 0413 04/20/20 0523  WBC 4.8   < > 5.7   < > 6.0 7.2 7.8 8.2 10.5  NEUTROABS 3.4  --  4.5  --  4.6 5.1  --   --   --   HGB 9.7*   < > 8.9*   < > 9.5* 9.1* 9.4* 9.5* 9.6*  HCT 30.4*   < > 26.2*   < > 28.8* 26.6* 27.6* 28.4* 28.4*  MCV 102.7*   < > 97.8   < > 98.3 96.7 97.5 97.3 95.9  PLT 157   < > 166   < > 177 158 146* 143* 146*   < > =  values in this interval not displayed.    Cardiac Enzymes: No results for input(s): CKTOTAL, CKMB, CKMBINDEX, TROPONINI in the last 168 hours.  BNP: Invalid input(s): POCBNP  CBG: Recent Labs  Lab 04/19/20 1212 04/19/20 1538 04/19/20 2033 04/20/20 0752 04/20/20 1155  GLUCAP 314* 295* 277* 180* 198*    Microbiology: Results for orders placed or performed during the hospital encounter of 04/12/20  Respiratory Panel by RT PCR (Flu A&B, Covid) - Nasopharyngeal Swab     Status: Abnormal   Collection Time: 04/12/20  3:56 PM   Specimen: Nasopharyngeal Swab  Result Value Ref Range Status   SARS Coronavirus 2 by RT PCR POSITIVE (A) NEGATIVE Final    Comment: RESULT CALLED TO, READ BACK BY AND VERIFIED WITH: [facility] REGISTER AT 6294 04/12/2020 BY MOSLEY,J (NOTE) SARS-CoV-2 target nucleic acids are DETECTED.  SARS-CoV-2 RNA is generally detectable in upper respiratory specimens  during the acute phase of infection. Positive results are indicative of the presence of the identified virus, but do not rule out bacterial infection or co-infection with other pathogens not detected by the test. Clinical correlation with patient history and other diagnostic information is necessary to determine patient infection status. The expected result is Negative.  Fact Sheet for Patients:   PinkCheek.be  Fact Sheet for Healthcare Providers: GravelBags.it  This test is not yet approved or cleared by the [place] FDA and  has been authorized for detection and/or diagnosis of SARS-CoV-2 by FDA under an Emergency Use Authorization (EUA).  This EUA will remain in effect (meaning this te st can be used) for the duration of  the COVID-19 declaration under Section 564(b)(1) of the Act, 21 U.S.C. section 360bbb-3(b)(1), unless the authorization is terminated or revoked sooner.      Influenza A by PCR NEGATIVE NEGATIVE Final   Influenza B by PCR NEGATIVE NEGATIVE Final    Comment: (NOTE) The Xpert Xpress SARS-CoV-2/FLU/RSV assay is intended as an aid in  the diagnosis of influenza from Nasopharyngeal swab specimens and  should not be used as a sole basis for treatment. Nasal washings and  aspirates are unacceptable for Xpert Xpress SARS-CoV-2/FLU/RSV  testing.  Fact Sheet for Patients: PinkCheek.be  Fact Sheet for Healthcare Providers: GravelBags.it  This test is not yet approved or cleared by the [place] FDA and  has been authorized for detection and/or diagnosis of SARS-CoV-2 by  FDA under an Emergency Use Authorization (EUA). This EUA will remain  in effect (meaning this test can be used) for the duration of the  Covid-19 declaration under Section 564(b)(1) of the Act, 21  U.S.C. section 360bbb-3(b)(1), unless the authorization is  terminated or revoked. Performed at [facility], [address] [place], [place] [address]   MRSA PCR Screening     Status: None   Collection Time: 04/18/20  3:37 PM   Specimen: Nasopharyngeal  Result Value Ref Range Status   MRSA by PCR NEGATIVE NEGATIVE Final    Comment:        The GeneXpert MRSA Assay (FDA approved for NASAL specimens only), is one component of a comprehensive MRSA  colonization surveillance program. It is not intended to diagnose MRSA infection nor to guide or monitor treatment for MRSA infections. Performed at [facility], [address] [place], [place] [address]     Coagulation Studies: No results for input(s): LABPROT, INR in the last 72 hours.  Urinalysis: No results for input(s): COLORURINE, LABSPEC, [place], [place], [place], [place], [place], [place], [place], NITRITE, LEUKOCYTESUR  in the last 72 hours.  Invalid input(s): APPERANCEUR    Imaging: No results found.   Medications:   . sodium chloride    . sodium chloride     . albuterol  2 puff Inhalation Q6H  . vitamin C  500 mg Oral Daily  . aspirin EC  81 mg Oral Daily  . atorvastatin  10 mg Oral Daily  . calcium acetate  1,334 mg Oral TID WC  . Chlorhexidine Gluconate Cloth  6 each Topical Q0600  . epoetin (EPOGEN/PROCRIT) injection  10,000 Units Intravenous Q T,Th,Sa-HD  . heparin  5,000 Units Subcutaneous Q8H  . insulin aspart  0-6 Units Subcutaneous TID WC  . insulin aspart  2 Units Subcutaneous TID WC  . levothyroxine  100 mcg Oral Q0600  . loratadine  10 mg Oral Daily  . metoprolol tartrate  12.5 mg Oral BID  . multivitamin  1 tablet Oral Daily  . omega-3 acid ethyl esters  2 g Oral BID  . polyethylene glycol  17 g Oral Daily  . predniSONE  40 mg Oral Q breakfast  . zinc sulfate  220 mg Oral Daily   sodium chloride, sodium chloride, acetaminophen, alteplase, guaiFENesin-dextromethorphan, heparin, hydrALAZINE, lidocaine (PF), lidocaine-prilocaine, lidocaine-prilocaine, ondansetron **OR** ondansetron (ZOFRAN) IV, ondansetron (ZOFRAN) IV, pentafluoroprop-tetrafluoroeth, polyvinyl alcohol  Assessment/ Plan:  Mr. Alejandro Armstrong is a 86 y.o. white male with end stage renal disease on hemodialysis, hypertension, diabetes mellitus type II, hypothyroidism, idiopathic thormbocytopenia purpura, and nephrolithiasis who was admitted to [facility] on  .04/12/2020  For Hyperkalemia [E87.5] ESRD (end stage renal disease) on dialysis ([place]) [N18.6, Z99.2] Generalized weakness [R53.1] COVID [U07.1]  [facility] MWF [place] RT IJ catheter, Left AVF 73 kg  1. End stage renal disease with hyperkalemia: l Planning for dialysis today at bedside Orders placed Patient will be get dialyzed TTS as outpatient, due to COVID 19 + outpatient schedule.  2. Anemia of chronic kidney disease Hemoglobin 9.6,at goal now  3. Hypertension:  Normotensive Continue current antihypertensive regimen  4. Secondary Hyperparathyroidism: Patient is on Phoslo Will continue monitoring bone mineral metabolism parameters as outpatient   LOS: 8 Lizzie An 11/5/202112:12 PM

## 2020-04-20 NOTE — Progress Notes (Addendum)
Attempted to call sister x2 per her request, no answer, awaiting return call

## 2020-04-23 ENCOUNTER — Other Ambulatory Visit (INDEPENDENT_AMBULATORY_CARE_PROVIDER_SITE_OTHER): Payer: Self-pay | Admitting: Nurse Practitioner

## 2020-04-24 ENCOUNTER — Other Ambulatory Visit: Payer: Self-pay

## 2020-04-24 ENCOUNTER — Observation Stay
Admission: EM | Admit: 2020-04-24 | Discharge: 2020-04-25 | Disposition: A | Discharge status: Home / Self Care | Payer: Medicare HMO | Attending: Hospitalist | Admitting: Hospitalist

## 2020-04-24 ENCOUNTER — Ambulatory Visit: Admission: RE | Admit: 2020-04-24 | Payer: Medicare HMO | Source: Home / Self Care | Admitting: Vascular Surgery

## 2020-04-24 ENCOUNTER — Emergency Department: Payer: Medicare HMO

## 2020-04-24 ENCOUNTER — Encounter: Payer: Self-pay | Admitting: Emergency Medicine

## 2020-04-24 DIAGNOSIS — Z7982 Long term (current) use of aspirin: Secondary | ICD-10-CM | POA: Diagnosis not present

## 2020-04-24 DIAGNOSIS — Z8616 Personal history of COVID-19: Secondary | ICD-10-CM | POA: Insufficient documentation

## 2020-04-24 DIAGNOSIS — D72829 Elevated white blood cell count, unspecified: Secondary | ICD-10-CM

## 2020-04-24 DIAGNOSIS — E1122 Type 2 diabetes mellitus with diabetic chronic kidney disease: Secondary | ICD-10-CM | POA: Insufficient documentation

## 2020-04-24 DIAGNOSIS — N186 End stage renal disease: Secondary | ICD-10-CM | POA: Diagnosis not present

## 2020-04-24 DIAGNOSIS — D631 Anemia in chronic kidney disease: Secondary | ICD-10-CM | POA: Diagnosis present

## 2020-04-24 DIAGNOSIS — U071 COVID-19: Secondary | ICD-10-CM

## 2020-04-24 DIAGNOSIS — Z992 Dependence on renal dialysis: Secondary | ICD-10-CM

## 2020-04-24 DIAGNOSIS — Z79899 Other long term (current) drug therapy: Secondary | ICD-10-CM | POA: Insufficient documentation

## 2020-04-24 DIAGNOSIS — E785 Hyperlipidemia, unspecified: Secondary | ICD-10-CM | POA: Diagnosis present

## 2020-04-24 DIAGNOSIS — T82868A Thrombosis of vascular prosthetic devices, implants and grafts, initial encounter: Secondary | ICD-10-CM | POA: Diagnosis not present

## 2020-04-24 DIAGNOSIS — I10 Essential (primary) hypertension: Secondary | ICD-10-CM | POA: Diagnosis present

## 2020-04-24 DIAGNOSIS — J1282 Pneumonia due to coronavirus disease 2019: Secondary | ICD-10-CM

## 2020-04-24 DIAGNOSIS — E039 Hypothyroidism, unspecified: Secondary | ICD-10-CM | POA: Diagnosis not present

## 2020-04-24 DIAGNOSIS — E1129 Type 2 diabetes mellitus with other diabetic kidney complication: Secondary | ICD-10-CM | POA: Diagnosis present

## 2020-04-24 DIAGNOSIS — D693 Immune thrombocytopenic purpura: Secondary | ICD-10-CM

## 2020-04-24 DIAGNOSIS — R042 Hemoptysis: Secondary | ICD-10-CM | POA: Diagnosis present

## 2020-04-24 DIAGNOSIS — I770 Arteriovenous fistula, acquired: Secondary | ICD-10-CM

## 2020-04-24 LAB — COMPREHENSIVE METABOLIC PANEL WITH GFR
ALT: 15 U/L (ref 0–44)
AST: 15 U/L (ref 15–41)
Albumin: 3.4 g/dL — ABNORMAL LOW (ref 3.5–5.0)
Alkaline Phosphatase: 87 U/L (ref 38–126)
Anion gap: 15 (ref 5–15)
BUN: 74 mg/dL — ABNORMAL HIGH (ref 8–23)
CO2: 21 mmol/L — ABNORMAL LOW (ref 22–32)
Calcium: 9.8 mg/dL (ref 8.9–10.3)
Chloride: 98 mmol/L (ref 98–111)
Creatinine, Ser: 7.63 mg/dL — ABNORMAL HIGH (ref 0.61–1.24)
GFR, Estimated: 6 mL/min — ABNORMAL LOW
Glucose, Bld: 225 mg/dL — ABNORMAL HIGH (ref 70–99)
Potassium: 5 mmol/L (ref 3.5–5.1)
Sodium: 134 mmol/L — ABNORMAL LOW (ref 135–145)
Total Bilirubin: 0.9 mg/dL (ref 0.3–1.2)
Total Protein: 7 g/dL (ref 6.5–8.1)

## 2020-04-24 LAB — CBC
HCT: 29.3 % — ABNORMAL LOW (ref 39.0–52.0)
Hemoglobin: 9.6 g/dL — ABNORMAL LOW (ref 13.0–17.0)
MCH: 33.1 pg (ref 26.0–34.0)
MCHC: 32.8 g/dL (ref 30.0–36.0)
MCV: 101 fL — ABNORMAL HIGH (ref 80.0–100.0)
Platelets: 163 K/uL (ref 150–400)
RBC: 2.9 MIL/uL — ABNORMAL LOW (ref 4.22–5.81)
RDW: 14.5 % (ref 11.5–15.5)
WBC: 12.1 K/uL — ABNORMAL HIGH (ref 4.0–10.5)
nRBC: 0.2 % (ref 0.0–0.2)

## 2020-04-24 LAB — APTT: aPTT: 30 seconds (ref 24–36)

## 2020-04-24 LAB — GLUCOSE, CAPILLARY: Glucose-Capillary: 135 mg/dL — ABNORMAL HIGH (ref 70–99)

## 2020-04-24 LAB — CBG MONITORING, ED: Glucose-Capillary: 343 mg/dL — ABNORMAL HIGH (ref 70–99)

## 2020-04-24 LAB — PROTIME-INR
INR: 0.9 (ref 0.8–1.2)
Prothrombin Time: 12.1 seconds (ref 11.4–15.2)

## 2020-04-24 MED ORDER — PREDNISONE 20 MG PO TABS
20.0000 mg | ORAL_TABLET | Freq: Every day | ORAL | Status: DC
  Administered 2020-04-25: 20 mg via ORAL
  Filled 2020-04-24: qty 1

## 2020-04-24 MED ORDER — CHLORHEXIDINE GLUCONATE CLOTH 2 % EX PADS
6.0000 | MEDICATED_PAD | Freq: Every day | CUTANEOUS | Status: DC
  Filled 2020-04-24: qty 6

## 2020-04-24 MED ORDER — CARBOXYMETHYLCELLULOSE SODIUM 0.25 % OP SOLN: 1.0000 [drp] | Freq: Three times a day (TID) | OPHTHALMIC | Status: DC | PRN

## 2020-04-24 MED ORDER — OMEGA-3 1000 MG PO CAPS: 2000.0000 mg | ORAL_CAPSULE | Freq: Two times a day (BID) | ORAL | Status: DC

## 2020-04-24 MED ORDER — INSULIN ASPART 100 UNIT/ML ~~LOC~~ SOLN: 0.0000 [IU] | Freq: Every day | SUBCUTANEOUS | Status: DC

## 2020-04-24 MED ORDER — POLYVINYL ALCOHOL 1.4 % OP SOLN
2.0000 [drp] | Freq: Three times a day (TID) | OPHTHALMIC | Status: DC | PRN
  Filled 2020-04-24: qty 15

## 2020-04-24 MED ORDER — ASCORBIC ACID 500 MG PO TABS
500.0000 mg | ORAL_TABLET | Freq: Every day | ORAL | Status: DC
  Administered 2020-04-25: 12:00:00 500 mg via ORAL
  Filled 2020-04-24: qty 1

## 2020-04-24 MED ORDER — ALBUTEROL SULFATE HFA 108 (90 BASE) MCG/ACT IN AERS
2.0000 | INHALATION_SPRAY | RESPIRATORY_TRACT | Status: DC | PRN
  Filled 2020-04-24: qty 6.7

## 2020-04-24 MED ORDER — VITAMIN D3 1.25 MG (50000 UT) PO CAPS
50000.0000 [IU] | ORAL_CAPSULE | ORAL | Status: DC
  Filled 2020-04-24: qty 1

## 2020-04-24 MED ORDER — ACETAMINOPHEN 325 MG PO TABS: 650.0000 mg | ORAL_TABLET | Freq: Four times a day (QID) | ORAL | Status: DC | PRN

## 2020-04-24 MED ORDER — HYDRALAZINE HCL 50 MG PO TABS
25.0000 mg | ORAL_TABLET | Freq: Two times a day (BID) | ORAL | Status: DC
  Administered 2020-04-25: 11:00:00 25 mg via ORAL
  Filled 2020-04-24: qty 1

## 2020-04-24 MED ORDER — LIDOCAINE-PRILOCAINE 2.5-2.5 % EX CREA
1.0000 "application " | TOPICAL_CREAM | Freq: Every day | CUTANEOUS | Status: DC | PRN
  Filled 2020-04-24: qty 5

## 2020-04-24 MED ORDER — OMEGA-3-ACID ETHYL ESTERS 1 G PO CAPS
2.0000 g | ORAL_CAPSULE | Freq: Two times a day (BID) | ORAL | Status: DC
  Administered 2020-04-25: 2 g via ORAL
  Filled 2020-04-24 (×2): qty 2

## 2020-04-24 MED ORDER — ONDANSETRON HCL 4 MG/2ML IJ SOLN
4.0000 mg | Freq: Three times a day (TID) | INTRAMUSCULAR | Status: DC | PRN
  Administered 2020-04-25: 4 mg via INTRAVENOUS
  Filled 2020-04-24: qty 2

## 2020-04-24 MED ORDER — HYDRALAZINE HCL 20 MG/ML IJ SOLN: 5.0000 mg | INTRAMUSCULAR | Status: DC | PRN

## 2020-04-24 MED ORDER — ZINC SULFATE 220 (50 ZN) MG PO CAPS
220.0000 mg | ORAL_CAPSULE | Freq: Every day | ORAL | Status: DC
  Administered 2020-04-25: 220 mg via ORAL
  Filled 2020-04-24: qty 1

## 2020-04-24 MED ORDER — LEVOTHYROXINE SODIUM 25 MCG PO TABS: 125.0000 ug | ORAL_TABLET | Freq: Every day | ORAL | Status: DC

## 2020-04-24 MED ORDER — RENA-VITE PO TABS
1.0000 | ORAL_TABLET | Freq: Every day | ORAL | Status: DC
  Administered 2020-04-25: 1 via ORAL
  Filled 2020-04-24 (×2): qty 1

## 2020-04-24 MED ORDER — DM-GUAIFENESIN ER 30-600 MG PO TB12: 1.0000 | ORAL_TABLET | Freq: Two times a day (BID) | ORAL | Status: DC | PRN

## 2020-04-24 MED ORDER — ATORVASTATIN CALCIUM 20 MG PO TABS
10.0000 mg | ORAL_TABLET | Freq: Every day | ORAL | Status: DC
  Administered 2020-04-25: 10 mg via ORAL
  Filled 2020-04-24: qty 1

## 2020-04-24 MED ORDER — INSULIN ASPART 100 UNIT/ML ~~LOC~~ SOLN
0.0000 [IU] | Freq: Three times a day (TID) | SUBCUTANEOUS | Status: DC
  Administered 2020-04-24: 7 [IU] via SUBCUTANEOUS
  Administered 2020-04-25: 3 [IU] via SUBCUTANEOUS
  Filled 2020-04-24 (×3): qty 1

## 2020-04-24 MED ORDER — CALCIUM ACETATE (PHOS BINDER) 667 MG PO CAPS
1334.0000 mg | ORAL_CAPSULE | Freq: Three times a day (TID) | ORAL | Status: DC
  Administered 2020-04-25 (×3): 1334 mg via ORAL
  Filled 2020-04-24 (×5): qty 2

## 2020-04-24 MED ORDER — LISINOPRIL 20 MG PO TABS
20.0000 mg | ORAL_TABLET | Freq: Every day | ORAL | Status: DC
  Administered 2020-04-25: 20 mg via ORAL
  Filled 2020-04-24: qty 1

## 2020-04-24 MED ORDER — FUROSEMIDE 40 MG PO TABS
40.0000 mg | ORAL_TABLET | Freq: Two times a day (BID) | ORAL | Status: DC
  Administered 2020-04-25 (×2): 40 mg via ORAL
  Filled 2020-04-24 (×2): qty 1

## 2020-04-24 MED ORDER — VITAMIN D3 1.25 MG (50000 UT) PO CAPS: 50000.0000 [IU] | ORAL_CAPSULE | ORAL | Status: DC

## 2020-04-24 NOTE — ED Notes (Signed)
Informed Alejandro Armstrong in dialysis that this patient will stay on isolation and contact precautions, she stated she would not be able to do his dialysis until about midnight, also she will need room 17 available

## 2020-04-24 NOTE — ED Notes (Signed)
Pt report having block in dialysis port. Was able to get dialysis last night but needs it fixed.

## 2020-04-24 NOTE — ED Notes (Signed)
Spoke with Dr. Blaine Hamper secured message and he stated that we will be following the 21 day protocol for this patient and keeping him on precaution and isolation

## 2020-04-24 NOTE — ED Notes (Signed)
Spoke with patients wife Jaamal Farooqui and updated her on patients plan of care

## 2020-04-24 NOTE — H&P (Addendum)
History and Physical    Alejandro Armstrong Alejandro Armstrong:[id number] DOB: 03-06-1930 DOA: 04/24/2020  Referring MD/NP/PA:   PCP: Casilda Carls, MD   Patient coming from:  The patient is coming from home.  At baseline, pt is partially dependent for most of ADL.        Chief Complaint: AV fistular thrombosis  HPI: Alejandro Armstrong is a 86 y.o. male with medical history significant of ESRD-HD (MWF), HTN, HLD, DM, hypothyroidism, ITP, recent COVID-19 pneumonia 04/07/2020, who presents with AV fistula thrombosis.  Per report, pt has AVF thrombus formation. He was scheduled for outpatient AV fistulogram.  He has right PermCath.  Unfortunately patient missed his appointment for the fistula gram this morning.  He was sent for dialysis today as a scheduled dialysis, but was sent to the ER.  Pt has worsened recently hospitalized from 10/23-11/5 due to COVID-19 pneumonia.  He is currently on tapering dose of prednisone.  He has generalized weakness, but denies cough or shortness of breath.  No fever or chills. Per Dr. Candiss Norse of nephrology, outpt dialysis reported of blood in his sputum. Pt does not have chest pain.  No fever or chills.  Patient denies nausea vomiting, diarrhea, abdominal pain, symptoms of UTI or unilateral weakness.  ED Course: pt was found to have WBC 12.1, potassium 5.0, bicarbonate 21, creatinine 7.63, BUN 74, temperature normal, blood pressure 180/78, heart rate of 45, oxygen saturation 96% on room air.  Patient is placed on MedSurg bed for observation.  Dr. Candiss Norse of renal and Dr. Delana Meyer of vascular surgery are consulted.  Review of Systems:   General: no fevers, chills, no body weight gain, has fatigue and weakness HEENT: no blurry vision, hearing changes or sore throat Respiratory: no dyspnea, coughing, wheezing CV: no chest pain, no palpitations GI: no nausea, vomiting, abdominal pain, diarrhea, constipation GU: no dysuria, burning on urination, increased urinary frequency,  hematuria  Ext: no leg edema Neuro: no unilateral weakness, numbness, or tingling, no vision change or hearing loss Skin: no rash, no skin tear. MSK: No muscle spasm, no deformity, no limitation of range of movement in spin Heme: No easy bruising.  Travel history: No recent long distant travel.  Allergy: No Known Allergies  Past Medical History:  Diagnosis Date  . Arthritis   . Dialysis patient [facility])    Mon, Wed Fri  . Hyperlipidemia   . Hypertension   . Hypothyroidism   . Idiopathic thrombocytopenia purpura ([place])   . Kidney stones   . Recurrent UTI   . Renal agenesis    discovered at age 43  . Type 2 diabetes mellitus ([place])   . Ureteral stricture     Past Surgical History:  Procedure Laterality Date  . A/V FISTULAGRAM Left 03/31/2017   Procedure: A/V Fistulagram;  Surgeon: [name], MD;  Location: [place] [facility];  Service: Cardiovascular;  Laterality: Left;  . A/V FISTULAGRAM Left 09/07/2018   Procedure: A/V FISTULAGRAM;  Surgeon: [name], MD;  Location: [place] [facility];  Service: Cardiovascular;  Laterality: Left;  . A/V SHUNTOGRAM Left 07/28/2017   Procedure: A/V SHUNTOGRAM;  Surgeon: [name], MD;  Location: [place] [facility];  Service: Cardiovascular;  Laterality: Left;  . A/V SHUNTOGRAM Left 03/24/2018   Procedure: A/V SHUNTOGRAM;  Surgeon: [name], MD;  Location: [place] [facility];  Service: Cardiovascular;  Laterality: Left;  . APPENDECTOMY    . AV FISTULA PLACEMENT Left 04/16/2016   Procedure: INSERTION OF  ARTERIOVENOUS (AV) GORE-TEX GRAFT ARM;  Surgeon: [name], MD;  Location: [facility];  Service: Vascular;  Laterality: Left;  . CATARACT EXTRACTION W/ INTRAOCULAR LENS IMPLANT Bilateral 2014   done 1-2 months apart at [facility].  [name] FINGER SURGERY Left 2002   pinky finger  . KIDNEY STONE SURGERY    . KYPHOPLASTY [name] 09/16/2018   Procedure: KYPHOPLASTY L3, DIABETIC;  Surgeon: Hessie Knows, MD;  Location:  [facility];  Service: Orthopedics;  Laterality: [name];  . NASAL SINUS SURGERY  1985  . PERIPHERAL VASCULAR CATHETERIZATION  05/13/2016   Procedure: Upper Extremity Angiography;  Surgeon: [name], MD;  Location: [place] [facility];  Service: Cardiovascular;;  . PERIPHERAL VASCULAR CATHETERIZATION  05/13/2016   Procedure: Upper Extremity Intervention;  Surgeon: [name], MD;  Location: [place] [facility];  Service: Cardiovascular;;  . PERIPHERAL VASCULAR THROMBECTOMY [name] 04/16/2020   Procedure: PERIPHERAL VASCULAR THROMBECTOMY, possible permcath placement;  Surgeon: Algernon Huxley, MD;  Location: [place] [facility];  Service: Cardiovascular;  Laterality: [name];  . PICC LINE PLACE PERIPHERAL ([place] HX) Right 08/2015  . PICC LINE REMOVAL ([place] HX) Right 09/2015    Social History:  reports that he has never smoked. He has never used smokeless tobacco. He reports that he does not drink alcohol and does not use drugs.  Family History:  Family History  Problem Relation Age of Onset  . Cervical cancer Sister   . CAD Father      Prior to Admission medications   Medication Sig Start Date End Date Taking? Authorizing Provider  acetaminophen (TYLENOL) 500 MG tablet Take 1,000 mg by mouth every 6 (six) hours as needed for moderate pain or headache.     [provider]  albuterol (VENTOLIN HFA) 108 (90 Base) MCG/ACT inhaler Inhale 2 puffs into the lungs every 6 (six) hours. 04/19/20   Sharen Hones, MD  ascorbic acid (VITAMIN C) 500 MG tablet Take 1 tablet (500 mg total) by mouth daily for 14 days. 04/20/20 05/04/20  Sharen Hones, MD  aspirin EC 81 MG EC tablet Take 1 tablet (81 mg total) by mouth daily. 11/05/19   Lavina Hamman, MD  atorvastatin (LIPITOR) 10 MG tablet Take 10 mg by mouth daily.    [provider]  B Complex-C-Folic Acid (RENA-VITE PO) Take 1 tablet by mouth daily.    [provider]  calcium acetate (PHOSLO) 667 MG capsule Take 2 capsules (1,334  mg total) by mouth 3 (three) times daily with meals. 04/19/20   Sharen Hones, MD  Carboxymethylcellulose Sodium (THERATEARS) 0.25 % SOLN Place 1-2 drops into both eyes 3 (three) times daily as needed (for dry eyes).    [provider]  cetirizine (ZYRTEC) 10 MG tablet Take 10 mg by mouth daily.  09/09/18   [provider]  Cholecalciferol (VITAMIN D3) 1.25 MG (50000 UT) CAPS Take 50,000 Units by mouth every Wednesday.     [provider]  furosemide (LASIX) 40 MG tablet Take 40 mg by mouth 2 (two) times daily.     [provider]  hydrALAZINE (APRESOLINE) 25 MG tablet Take 25 mg by mouth 2 (two) times daily.    [provider]  levothyroxine (SYNTHROID) 125 MCG tablet Take 125 mcg by mouth daily before breakfast.     [provider]  lidocaine-prilocaine (EMLA) cream Apply 1 application topically daily as needed (port access).  10/03/16   [provider]  lisinopril (PRINIVIL,ZESTRIL) 20 MG tablet Take  20 mg by mouth daily.  06/24/12   [provider]  metoprolol tartrate (LOPRESSOR) 25 MG tablet Take 0.5 tablets (12.5 mg total) by mouth 2 (two) times daily. 04/19/20   Sharen Hones, MD  Omega-3 1000 MG CAPS Take 2,000 mg by mouth 2 (two) times daily.     [provider]  predniSONE (DELTASONE) 20 MG tablet Take 2 tablets (40 mg total) by mouth daily with breakfast for 3 days, THEN 1 tablet (20 mg total) daily with breakfast for 3 days, THEN 0.5 tablets (10 mg total) daily with breakfast for 3 days. 04/20/20 04/29/20  Sharen Hones, MD  zinc sulfate 220 (50 Zn) MG capsule Take 1 capsule (220 mg total) by mouth daily for 14 days. 04/20/20 05/04/20  Sharen Hones, MD    Physical Exam: Vitals:   04/24/20 1600 04/24/20 1630 04/24/20 1800 04/24/20 1805  BP: 140/64 (!) 143/57 (!) 163/65 (!) 163/65  Pulse:  (!) 40 (!) 41 (!) 41  Resp:    16  Temp:      TempSrc:      SpO2:  95% 95% 95%  Weight:      Height:       General: Not in  acute distress HEENT:       Eyes: PERRL, EOMI, no scleral icterus.       ENT: No discharge from the ears and nose, no pharynx injection, no tonsillar enlargement.        Neck: No JVD, no bruit, no mass felt. Heme: No neck lymph node enlargement. Cardiac: S1/S2, RRR, No murmurs, No gallops or rubs. Respiratory:  No rales, wheezing, rhonchi or rubs. GI: Soft, nondistended, nontender, no rebound pain, no organomegaly, BS present. GU: No hematuria Ext: No pitting leg edema bilaterally. 1+DP/PT pulse bilaterally. Musculoskeletal: No joint deformities, No joint redness or warmth, no limitation of ROM in spin. Skin: No rashes.  Neuro: Alert, oriented X3, cranial nerves II-XII grossly intact, moves all extremities normally.  Psych: Patient is not psychotic, no suicidal or hemocidal ideation.  Labs on Admission: I have personally reviewed following labs and imaging studies  CBC: Recent Labs  Lab 04/18/20 0553 04/19/20 0413 04/20/20 0523 04/24/20 1038  WBC 7.8 8.2 10.5 12.1*  HGB 9.4* 9.5* 9.6* 9.6*  HCT 27.6* 28.4* 28.4* 29.3*  MCV 97.5 97.3 95.9 101.0*  PLT 146* 143* 146* 707   Basic Metabolic Panel: Recent Labs  Lab 04/18/20 0553 04/19/20 0413 04/20/20 0523 04/24/20 1038  NA 140 135 135 134*  K 3.7 4.3 4.5 5.0  CL 101 98 99 98  CO2 28 24 23  21*  GLUCOSE 161* 255* 183* 225*  BUN 40* 64* 78* 74*  CREATININE 4.78* 6.03* 7.09* 7.63*  CALCIUM 8.8* 9.5 10.3 9.8   GFR: Estimated Creatinine Clearance: 6.4 mL/min (A) (by C-G formula based on SCr of 7.63 mg/dL (H)). Liver Function Tests: Recent Labs  Lab 04/24/20 1038  AST 15  ALT 15  ALKPHOS 87  BILITOT 0.9  PROT 7.0  ALBUMIN 3.4*   No results for input(s): LIPASE, AMYLASE in the last 168 hours. No results for input(s): AMMONIA in the last 168 hours. Coagulation Profile: No results for input(s): INR, PROTIME in the last 168 hours. Cardiac Enzymes: No results for input(s): CKTOTAL, CKMB, CKMBINDEX, TROPONINI in the  last 168 hours. BNP (last 3 results) No results for input(s): PROBNP in the last 8760 hours. HbA1C: No results for input(s): HGBA1C in the last 72 hours. CBG: Recent Labs  Lab 04/19/20  2033 04/20/20 0752 04/20/20 1155 04/20/20 1553 04/24/20 1752  GLUCAP 277* 180* 198* 195* 343*   Lipid Profile: No results for input(s): CHOL, HDL, LDLCALC, TRIG, CHOLHDL, LDLDIRECT in the last 72 hours. Thyroid Function Tests: No results for input(s): TSH, T4TOTAL, FREET4, T3FREE, THYROIDAB in the last 72 hours. Anemia Panel: No results for input(s): VITAMINB12, FOLATE, FERRITIN, TIBC, IRON, RETICCTPCT in the last 72 hours. Urine analysis:    Component Value Date/Time   COLORURINE YELLOW (A) 04/12/2020 1740   APPEARANCEUR CLOUDY (A) 04/12/2020 1740   APPEARANCEUR Cloudy 10/15/2012 2114   LABSPEC 1.009 04/12/2020 1740   LABSPEC 1.014 10/15/2012 2114   PHURINE 8.0 04/12/2020 1740   GLUCOSEU >=500 (A) 04/12/2020 1740   GLUCOSEU Negative 10/15/2012 2114   HGBUR SMALL (A) 04/12/2020 1740   BILIRUBINUR NEGATIVE 04/12/2020 1740   BILIRUBINUR Negative 10/15/2012 2114   [place] 04/12/2020 1740   PROTEINUR 100 (A) 04/12/2020 1740   NITRITE NEGATIVE 04/12/2020 1740   LEUKOCYTESUR MODERATE (A) 04/12/2020 1740   LEUKOCYTESUR 3+ 10/15/2012 2114   Sepsis Labs: @LABRCNTIP (procalcitonin:4,lacticidven:4) ) Recent Results (from the past 240 hour(s))  MRSA PCR Screening     Status: None   Collection Time: 04/18/20  3:37 PM   Specimen: Nasopharyngeal  Result Value Ref Range Status   MRSA by PCR NEGATIVE NEGATIVE Final    Comment:        The GeneXpert MRSA Assay (FDA approved for NASAL specimens only), is one component of a comprehensive MRSA colonization surveillance program. It is not intended to diagnose MRSA infection nor to guide or monitor treatment for MRSA infections. Performed at [facility], [address] [place], [place] [address]      Radiological Exams on  Admission: DG Chest Portable 1 View  Result Date: 04/24/2020 CLINICAL DATA:  End-stage renal disease, edema EXAM: PORTABLE CHEST 1 VIEW COMPARISON:  Portable exam 1229 hours compared to 04/12/2020 FINDINGS: RIGHT jugular dual-lumen central venous catheter with tip projecting over RIGHT atrium. Enlargement of cardiac silhouette with slight pulmonary vascular congestion. Question hiatal hernia. Chronic accentuation of interstitial markings with subsegmental atelectasis at lung bases. No acute infiltrate, pleural effusion, or pneumothorax. Coronary stent noted as well as vascular stents at the LEFT subclavian region and at the LEFT axilla/proximal LEFT upper extremity. IMPRESSION: Enlargement of cardiac silhouette with pulmonary vascular congestion. Subsegmental atelectasis at lung bases without definite acute infiltrate. Electronically Signed   By: Lavonia Dana M.D.   On: 04/24/2020 13:08     EKG: I have personally reviewed.  Sinus rhythm, QTC 385, heart rate 45, poor R wave progression  Assessment/Plan Principal Problem:   AV fistula thrombosis (HCC) Active Problems:   Idiopathic thrombocytopenic purpura (HCC)   Type II diabetes mellitus with renal manifestations (HCC)   Hyperlipidemia   ESRD (end stage renal disease) on dialysis (HCC)   Essential hypertension   Pneumonia due to COVID-19 virus   Leukocytosis   Hemoptysis   Anemia in ESRD (end-stage renal disease) ([place])   AV fistula thrombosis ([place]) -will place in med-surg bed for obs -Dr. Delana Meyer of VVS is consulted -->will do AV fistulogram tomorrow  Idiopathic thrombocytopenic purpura ([place]): platelet 163 today -f/u with CBC  Type II diabetes mellitus with renal manifestations ([place]): Recent A1c 6.9, not taking medications at home.  Blood sugar 225 by BNP -SSI  Recent pneumonia due to COVID-19 virus: Oxygen saturation 96% on room air.  No cough or shortness breath. -Continue prednisone tapering, currently on 20 g daily -As needed  albuterol and Mucinex  Leukocytosis: WBC 12.1, no source of infection identified.  Likely due to steroid use -Follow-up with CBC  Anemia in ESRD (end-stage renal disease) ([place]): Hemoglobin 9.6 (9.6 on 04/20/2020, stable -Follow-up with CBC  ESRD-HD (MWF) -Dr. Jay Schlichter is consulted for dialysis  HTN: -IV hydralazine as needed -hold Toprol due to bradycardia -Continue hydralazine, Lasix, lisinopril  HLD: -lipitor  Hemoptysis: pt does not have chest pain.  Since patient has COVID-19 infection recently, which increases the risk of developing PE. -will get CTA to r/o PE    DVT ppx: SCD Code Status: DNR (his wife confirmed that pt is DNR) Family Communication: Yes, patient's wife by phone  Disposition Plan:  Anticipate discharge back to previous environment Consults called: Dr. Candiss Norse of renal, Dr. Delana Meyer of VVS  Admission status: Med-surg bed for obs   Status is: Observation  The patient remains OBS appropriate and will d/c before 2 midnights.  Dispo: The patient is from: Home              Anticipated d/c is to: Home              Anticipated d/c date is: 1 day              Patient currently is not medically stable to d/c.          Date of Service 04/24/2020    Ivor Costa [facility]   If 7PM-7AM, please contact night-coverage [email or web address] 04/24/2020, 6:12 PM

## 2020-04-24 NOTE — ED Notes (Signed)
Spoke with Dr. Blaine Hamper about patient having a low pulse rate, Dr. Blaine Hamper states to hold metoprolol

## 2020-04-24 NOTE — Progress Notes (Signed)
[facility]  ROUNDING NOTE   Subjective:   Patient known to our practice from previous admission as well as outpatient dialysis.  He was sent over from dialysis unit because nurse noted blood in the sputum.  He also has a clotted AV graft.  He is admitted for further evaluation and management Today, he denies any acute shortness of breath He is able to eat without nausea or vomiting   Objective:  Vital signs in last 24 hours:  Temp:  [97.9 F (36.6 C)] 97.9 F (36.6 C) (11/09 1031) Pulse Rate:  [45] 45 (11/09 1031) BP: (180)/(78) 180/78 (11/09 1031) SpO2:  [96 %] 96 % (11/09 1031) Weight:  [72.6 kg] 72.6 kg (11/09 1032)  Weight change:  Filed Weights   04/24/20 1032  Weight: 72.6 kg    Intake/Output: No intake/output data recorded.   Intake/Output this shift:  No intake/output data recorded.  Physical Exam: General: In no acute distress  Head: Oral mucous membranes moist  Eyes: Anicteric  Lungs:  Normal effort, Lungs clear bilaterally  Heart: Regular  Abdomen:  Soft, nontender, non distended  Extremities:  No  peripheral edema.  Neurologic: Alert, awake, oriented, hard of hearing  Skin: No lesions or rashes noted  Access: Rt IJ catheter,LUA AVF no thrill or bruit    Basic Metabolic Panel: Recent Labs  Lab 04/18/20 0553 04/18/20 0553 04/19/20 0413 04/20/20 0523 04/24/20 1038  NA 140  --  135 135 134*  K 3.7  --  4.3 4.5 5.0  CL 101  --  98 99 98  CO2 28  --  24 23 21*  GLUCOSE 161*  --  255* 183* 225*  BUN 40*  --  64* 78* 74*  CREATININE 4.78*  --  6.03* 7.09* 7.63*  CALCIUM 8.8*   < > 9.5 10.3 9.8   < > = values in this interval not displayed.    Liver Function Tests: Recent Labs  Lab 04/24/20 1038  AST 15  ALT 15  ALKPHOS 87  BILITOT 0.9  PROT 7.0  ALBUMIN 3.4*   No results for input(s): LIPASE, AMYLASE in the last 168 hours. No results for input(s): AMMONIA in the last 168 hours.  CBC: Recent Labs  Lab 04/18/20 0553  04/19/20 0413 04/20/20 0523 04/24/20 1038  WBC 7.8 8.2 10.5 12.1*  HGB 9.4* 9.5* 9.6* 9.6*  HCT 27.6* 28.4* 28.4* 29.3*  MCV 97.5 97.3 95.9 101.0*  PLT 146* 143* 146* 163    Cardiac Enzymes: No results for input(s): CKTOTAL, CKMB, CKMBINDEX, TROPONINI in the last 168 hours.  BNP: Invalid input(s): POCBNP  CBG: Recent Labs  Lab 04/19/20 1538 04/19/20 2033 04/20/20 0752 04/20/20 1155 04/20/20 1553  GLUCAP 295* 277* 180* 198* 195*    Microbiology: Results for orders placed or performed during the hospital encounter of 04/12/20  Respiratory Panel by RT PCR (Flu A&B, Covid) - Nasopharyngeal Swab     Status: Abnormal   Collection Time: 04/12/20  3:56 PM   Specimen: Nasopharyngeal Swab  Result Value Ref Range Status   SARS Coronavirus 2 by RT PCR POSITIVE (A) NEGATIVE Final    Comment: RESULT CALLED TO, READ BACK BY AND VERIFIED WITH: [facility] REGISTER AT 1610 04/12/2020 BY MOSLEY,J (NOTE) SARS-CoV-2 target nucleic acids are DETECTED.  SARS-CoV-2 RNA is generally detectable in upper respiratory specimens  during the acute phase of infection. Positive results are indicative of the presence of the identified virus, but do not rule out bacterial infection or co-infection  with other pathogens not detected by the test. Clinical correlation with patient history and other diagnostic information is necessary to determine patient infection status. The expected result is Negative.  Fact Sheet for Patients:  PinkCheek.be  Fact Sheet for Healthcare Providers: GravelBags.it  This test is not yet approved or cleared by the [place] FDA and  has been authorized for detection and/or diagnosis of SARS-CoV-2 by FDA under an Emergency Use Authorization (EUA).  This EUA will remain in effect (meaning this te st can be used) for the duration of  the COVID-19 declaration under Section 564(b)(1) of the Act, 21 U.S.C. section  360bbb-3(b)(1), unless the authorization is terminated or revoked sooner.      Influenza A by PCR NEGATIVE NEGATIVE Final   Influenza B by PCR NEGATIVE NEGATIVE Final    Comment: (NOTE) The Xpert Xpress SARS-CoV-2/FLU/RSV assay is intended as an aid in  the diagnosis of influenza from Nasopharyngeal swab specimens and  should not be used as a sole basis for treatment. Nasal washings and  aspirates are unacceptable for Xpert Xpress SARS-CoV-2/FLU/RSV  testing.  Fact Sheet for Patients: PinkCheek.be  Fact Sheet for Healthcare Providers: GravelBags.it  This test is not yet approved or cleared by the [place] FDA and  has been authorized for detection and/or diagnosis of SARS-CoV-2 by  FDA under an Emergency Use Authorization (EUA). This EUA will remain  in effect (meaning this test can be used) for the duration of the  Covid-19 declaration under Section 564(b)(1) of the Act, 21  U.S.C. section 360bbb-3(b)(1), unless the authorization is  terminated or revoked. Performed at [facility], [address] [place], [place] [address]   MRSA PCR Screening     Status: None   Collection Time: 04/18/20  3:37 PM   Specimen: Nasopharyngeal  Result Value Ref Range Status   MRSA by PCR NEGATIVE NEGATIVE Final    Comment:        The GeneXpert MRSA Assay (FDA approved for NASAL specimens only), is one component of a comprehensive MRSA colonization surveillance program. It is not intended to diagnose MRSA infection nor to guide or monitor treatment for MRSA infections. Performed at [facility], [address] [place], [place] [address]     Coagulation Studies: No results for input(s): LABPROT, INR in the last 72 hours.  Urinalysis: No results for input(s): COLORURINE, LABSPEC, PHURINE, GLUCOSEU, HGBUR, BILIRUBINUR, KETONESUR, PROTEINUR, UROBILINOGEN, NITRITE, LEUKOCYTESUR in the last 72  hours.  Invalid input(s): APPERANCEUR    Imaging: DG Chest Portable 1 View  Result Date: 04/24/2020 CLINICAL DATA:  End-stage renal disease, edema EXAM: PORTABLE CHEST 1 VIEW COMPARISON:  Portable exam 1229 hours compared to 04/12/2020 FINDINGS: RIGHT jugular dual-lumen central venous catheter with tip projecting over RIGHT atrium. Enlargement of cardiac silhouette with slight pulmonary vascular congestion. Question hiatal hernia. Chronic accentuation of interstitial markings with subsegmental atelectasis at lung bases. No acute infiltrate, pleural effusion, or pneumothorax. Coronary stent noted as well as vascular stents at the LEFT subclavian region and at the LEFT axilla/proximal LEFT upper extremity. IMPRESSION: Enlargement of cardiac silhouette with pulmonary vascular congestion. Subsegmental atelectasis at lung bases without definite acute infiltrate. Electronically Signed   By: Lavonia Dana M.D.   On: 04/24/2020 13:08     Medications:    . [START ON 04/25/2020] Chlorhexidine Gluconate Cloth  6 each Topical Q0600  . insulin aspart  0-5 Units Subcutaneous QHS  . insulin aspart  0-9 Units Subcutaneous TID WC   acetaminophen,  albuterol, dextromethorphan-guaiFENesin, hydrALAZINE, ondansetron (ZOFRAN) IV  Assessment/ Plan:  Mr. FAOLAN SPRINGFIELD is a 86 y.o. white male with end stage renal disease on hemodialysis, hypertension, diabetes mellitus type II, hypothyroidism, idiopathic thormbocytopenia purpura, and nephrolithiasis who was admitted to [facility] on .04/24/2020  For AV fistula thrombosis ([place]) [W86.168H]  [facility] [address] RT IJ catheter, Left AVF 73 kg  1.  Complication of dialysis access in setting of ESRD Planning for dialysis today at bedside Orders placed Patient will be get dialyzed TTS as outpatient, due to COVID 19 + outpatient schedule. Angiogram and declotting of access planned for tomorrow  2. Anemia of chronic kidney disease Lab Results  Component Value  Date   HGB 9.6 (L) 04/24/2020   EPO with HD  3.  Secondary Hyperparathyroidism: Patient is on Phoslo Will continue monitoring bone mineral metabolism parameters as outpatient  4.  Hemoptysis We will discuss with hospitalist about further work-up    LOS: 0 Opha Mcghee 11/9/20212:44 PM

## 2020-04-24 NOTE — Progress Notes (Signed)
[facility] Daily Progress Note  Please see vascular surgery note from April 15, 2020 (Dr. Benjamin Stain). Patient had PermCath inserted on April 16, 2020 however outpatient dialysis staff have not access it. Patient was on our schedule to undergo declot this morning however was sent to ED by outpatient dialysis staff. Unsure why? Will attempt left upper extremity access declot tomorrow. Recommend dialyzing patient through PermCath. Please keep patient NPO.  Discussed with Dr. Eber Hong Chozen Latulippe PA-C 04/24/2020 2:25 PM

## 2020-04-24 NOTE — ED Provider Notes (Signed)
[facility] Emergency Department Provider Note    First MD Initiated Contact with Patient 04/24/20 1133     (approximate)  I have reviewed the triage vital signs and the nursing notes.   HISTORY  Chief Complaint Dialysis port blocked  History limited. Patient very hard of hearing  HPI Alejandro Armstrong is a 86 y.o. male with extensive past medical history presents to the ER for uncertain reasons.  He was scheduled for outpatient AV fistulogram due to known thrombus.  He has right PermCath.  Reportedly missed his appointment for the fistula gram this morning.  He was sent for dialysis is today as a scheduled dialysis and was sent to the ER for unconfirmed reason.  States that he has been feeling generalized malaise was recently hospitalized for COVID-19.    Past Medical History:  Diagnosis Date  . Arthritis   . Dialysis patient [facility])    Mon, Wed Fri  . Hyperlipidemia   . Hypertension   . Hypothyroidism   . Idiopathic thrombocytopenia purpura ([place])   . Kidney stones   . Recurrent UTI   . Renal agenesis    discovered at age 72  . Type 2 diabetes mellitus ([place])   . Ureteral stricture    Family History  Problem Relation Age of Onset  . Cervical cancer Sister   . CAD Father    Past Surgical History:  Procedure Laterality Date  . A/V FISTULAGRAM Left 03/31/2017   Procedure: A/V Fistulagram;  Surgeon: [name], MD;  Location: [place] [facility];  Service: Cardiovascular;  Laterality: Left;  . A/V FISTULAGRAM Left 09/07/2018   Procedure: A/V FISTULAGRAM;  Surgeon: [name], MD;  Location: [place] [facility];  Service: Cardiovascular;  Laterality: Left;  . A/V SHUNTOGRAM Left 07/28/2017   Procedure: A/V SHUNTOGRAM;  Surgeon: [name], MD;  Location: [place] [facility];  Service: Cardiovascular;  Laterality: Left;  . A/V SHUNTOGRAM Left 03/24/2018   Procedure: A/V SHUNTOGRAM;  Surgeon: [name], MD;   Location: [place] [facility];  Service: Cardiovascular;  Laterality: Left;  . APPENDECTOMY    . AV FISTULA PLACEMENT Left 04/16/2016   Procedure: INSERTION OF ARTERIOVENOUS (AV) GORE-TEX GRAFT ARM;  Surgeon: [name], MD;  Location: [facility] ORS;  Service: Vascular;  Laterality: Left;  . CATARACT EXTRACTION W/ INTRAOCULAR LENS IMPLANT Bilateral 2014   done 1-2 months apart at [facility].  [name] FINGER SURGERY Left 2002   pinky finger  . KIDNEY STONE SURGERY    . KYPHOPLASTY [name] 09/16/2018   Procedure: KYPHOPLASTY L3, DIABETIC;  Surgeon: Hessie Knows, MD;  Location: [facility] ORS;  Service: Orthopedics;  Laterality: [name];  . NASAL SINUS SURGERY  1985  . PERIPHERAL VASCULAR CATHETERIZATION  05/13/2016   Procedure: Upper Extremity Angiography;  Surgeon: [name], MD;  Location: [place] [facility];  Service: Cardiovascular;;  . PERIPHERAL VASCULAR CATHETERIZATION  05/13/2016   Procedure: Upper Extremity Intervention;  Surgeon: [name], MD;  Location: [place] [facility];  Service: Cardiovascular;;  . PERIPHERAL VASCULAR THROMBECTOMY [name] 04/16/2020   Procedure: PERIPHERAL VASCULAR THROMBECTOMY, possible permcath placement;  Surgeon: Algernon Huxley, MD;  Location: [place] [facility];  Service: Cardiovascular;  Laterality: [name];  . PICC LINE PLACE PERIPHERAL ([place] HX) Right 08/2015  . PICC LINE REMOVAL ([place] HX) Right 09/2015   Patient Active Problem List   Diagnosis Date Noted  . Acute hypoxemic respiratory failure ([place]) 04/18/2020  . Pneumonia  due to COVID-19 virus   . Generalized weakness   . Recurrent UTI 02/16/2020  . Chest pain 11/04/2019  . Complication of vascular access for dialysis 11/20/2017  . Hyperlipidemia 03/26/2017  . ESRD (end stage renal disease) on dialysis ([place]) 03/26/2017  . Postop check 04/30/2016  . Fever chills 09/18/2015  . Bacteremia 09/18/2015  . Acute on chronic renal insufficiency 09/01/2015  . Hyperkalemia 09/01/2015  . MRSA bacteremia secondary to  PICC line infection 09/01/2015  . Type 2 diabetes mellitus ([place]) 09/01/2015  . UTI (lower urinary tract infection) 08/06/2015  . Idiopathic thrombocytopenic purpura ([place]) 08/02/2015  . B12 deficiency 07/24/2015  . Pseudomonas infection 04/28/2014  . 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 Urinary urgency 02/21/2013  . Hypertrophy of prostate with urinary obstruction and other lower urinary tract symptoms (LUTS) 11/18/2012  . Calculus of kidney 03/12/2012  . Increased frequency of urination 03/12/2012  . Nocturia 03/12/2012  . 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 Urethral stricture 03/12/2012  . Urinary tract infection 03/12/2012  . Essential hypertension 03/31/2011  . Hypothyroidism 03/31/2011  . Chronic kidney disease, stage IV (severe) ([place]) 03/31/2011      Prior to Admission medications   Medication Sig Start Date End Date Taking? Authorizing Provider  acetaminophen (TYLENOL) 500 MG tablet Take 1,000 mg by mouth every 6 (six) hours as needed for moderate pain or headache.     [provider]  albuterol (VENTOLIN HFA) 108 (90 Base) MCG/ACT inhaler Inhale 2 puffs into the lungs every 6 (six) hours. 04/19/20   Sharen Hones, MD  ascorbic acid (VITAMIN C) 500 MG tablet Take 1 tablet (500 mg total) by mouth daily for 14 days. 04/20/20 05/04/20  Sharen Hones, MD  aspirin EC 81 MG EC tablet Take 1 tablet (81 mg total) by mouth daily. 11/05/19   Lavina Hamman, MD  atorvastatin (LIPITOR) 10 MG tablet Take 10 mg by mouth daily.    [provider]  B Complex-C-Folic Acid (RENA-VITE PO) Take 1 tablet by mouth daily.    [provider]  calcium acetate (PHOSLO) 667 MG capsule Take 2 capsules (1,334 mg total) by mouth 3 (three) times daily with meals. 04/19/20   Sharen Hones, MD  Carboxymethylcellulose Sodium (THERATEARS) 0.25 % SOLN Place 1-2 drops into both eyes 3 (three) times daily as needed (for dry eyes).    [provider]  cetirizine (ZYRTEC)  10 MG tablet Take 10 mg by mouth daily.  09/09/18   [provider]  Cholecalciferol (VITAMIN D3) 1.25 MG (50000 UT) CAPS Take 50,000 Units by mouth every Wednesday.     [provider]  furosemide (LASIX) 40 MG tablet Take 40 mg by mouth 2 (two) times daily.     [provider]  hydrALAZINE (APRESOLINE) 25 MG tablet Take 25 mg by mouth 2 (two) times daily.    [provider]  levothyroxine (SYNTHROID) 125 MCG tablet Take 125 mcg by mouth daily before breakfast.     [provider]  lidocaine-prilocaine (EMLA) cream Apply 1 application topically daily as needed (port access).  10/03/16   [provider]  lisinopril (PRINIVIL,ZESTRIL) 20 MG tablet Take 20 mg by mouth daily.  06/24/12   [provider]  metoprolol tartrate (LOPRESSOR) 25 MG tablet Take 0.5 tablets (12.5 mg total) by mouth 2 (two) times daily. 04/19/20   Sharen Hones, MD  Omega-3 1000 MG CAPS Take 2,000 mg by mouth 2 (two) times daily.     [provider]  predniSONE (DELTASONE) 20 MG tablet Take 2 tablets (40 mg total) by mouth daily with breakfast for 3 days, THEN 1 tablet (20 mg total) daily with breakfast for 3 days, THEN 0.5 tablets (10 mg total) daily with breakfast for 3 days. 04/20/20 04/29/20  Sharen Hones, MD  zinc sulfate 220 (50 Zn) MG capsule Take 1 capsule (220 mg total) by mouth daily for 14 days. 04/20/20 05/04/20  Sharen Hones, MD    Allergies Patient has no known allergies.    Social History Social History   Tobacco Use  . Smoking status: Never Smoker  . Smokeless tobacco: Never Used  Vaping Use  . Vaping Use: Never used  Substance Use Topics  . Alcohol use: No    Alcohol/week: 0.0 standard drinks  . Drug use: No    Review of Systems Patient denies headaches, rhinorrhea, blurry vision, numbness, shortness of breath, chest pain, edema, cough, abdominal pain, nausea, vomiting, diarrhea, dysuria, fevers, rashes or hallucinations unless  otherwise stated above in HPI. ____________________________________________   PHYSICAL EXAM:  VITAL SIGNS: Vitals:   04/24/20 1031  BP: (!) 180/78  Pulse: (!) 45  Temp: 97.9 F (36.6 C)  SpO2: 96%    Constitutional: Alert, chronically ill appearing in NAD Eyes: Conjunctivae are normal.  Head: Atraumatic. Nose: No congestion/rhinnorhea. Mouth/Throat: Mucous membranes are moist.   Neck: No stridor. Painless ROM.  Cardiovascular: Normal rate, regular rhythm. Grossly normal heart sounds.  Good peripheral circulation. Respiratory: Normal respiratory effort.  No retractions. Lungs CTAB. Gastrointestinal: Soft and nontender. No distention. No abdominal bruits. No CVA tenderness. Genitourinary:  Musculoskeletal: No lower extremity tenderness nor edema.  No joint effusions. Neurologic: No gross focal neurologic deficits are appreciated. No facial droop Skin:  Skin is warm, dry and intact. No rash noted. Psychiatric: calm and cooperative  ____________________________________________   LABS (all labs ordered are listed, but only abnormal results are displayed)  Results for orders placed or performed during the hospital encounter of 04/24/20 (from the past 24 hour(s))  CBC     Status: Abnormal   Collection Time: 04/24/20 10:38 AM  Result Value Ref Range   WBC 12.1 (H) 4.0 - 10.5 K/uL   RBC 2.90 (L) 4.22 - 5.81 MIL/uL   Hemoglobin 9.6 (L) 13.0 - 17.0 g/dL   HCT 29.3 (L) 39 - 52 %   MCV 101.0 (H) 80.0 - 100.0 fL   MCH 33.1 26.0 - 34.0 pg   MCHC 32.8 30.0 - 36.0 g/dL   RDW 14.5 11.5 - 15.5 %   Platelets 163 150 - 400 K/uL   nRBC 0.2 0.0 - 0.2 %  Comprehensive metabolic panel     Status: Abnormal   Collection Time: 04/24/20 10:38 AM  Result Value Ref Range   Sodium 134 (L) 135 - 145 mmol/L   Potassium 5.0 3.5 - 5.1 mmol/L   Chloride 98 98 - 111 mmol/L   CO2 21 (L) 22 - 32 mmol/L   Glucose, Bld 225 (H) 70 - 99 mg/dL   BUN 74 (H) 8 - 23 mg/dL   Creatinine, Ser 7.63 (H) 0.61 -  1.24 mg/dL   Calcium 9.8 8.9 - 10.3 mg/dL   Total Protein 7.0 6.5 - 8.1 g/dL   Albumin 3.4 (L) 3.5 - 5.0 g/dL   AST 15 15 - 41 U/L   ALT 15 0 - 44 U/L   Alkaline Phosphatase 87 38 - 126 U/L   Total Bilirubin 0.9 0.3 - 1.2 mg/dL  GFR, Estimated 6 (L) >60 mL/min   Anion gap 15 5 - 15   ____________________________________________  ____________________________________________  RADIOLOGY   ____________________________________________   PROCEDURES  Procedure(s) performed:  Procedures    Critical Care performed: no ____________________________________________   INITIAL IMPRESSION / ASSESSMENT AND PLAN / ED COURSE  Pertinent labs & imaging results that were available during my care of the patient were reviewed by me and considered in my medical decision making (see chart for details).   DDX: ESRD, AV fistula complication, post Covid syndrome  Alejandro Armstrong is a 86 y.o. who presents to the ED with presentation as described above.  Is unclear as to exactly how he was brought to the ER.  I spoke in consultation with vascular surgery, Dr. Delana Meyer.  There is nothing in particular concern on there and but the patient was supposed to come for fistulogram which he did not show up to.  And spoke with Dr. Candiss Norse of nephrology who tried to coordinate with dialysis center and see if there are any specific concerns but was unable to get any additional information.  Based on his risk factors and presentation with nonfunctioning AV fistulogram and discussion with the consultants, plan will be observation for medical admission, dialysis and evaluation by vascular surgery for fistulogram     The patient was evaluated in Emergency Department today for the symptoms described in the history of present illness. He/she was evaluated in the context of the global COVID-19 pandemic, which necessitated consideration that the patient might be at risk for infection with the SARS-CoV-2 virus that causes  COVID-19. Institutional protocols and algorithms that pertain to the evaluation of patients at risk for COVID-19 are in a state of rapid change based on information released by regulatory bodies including the CDC and federal and state organizations. These policies and algorithms were followed during the patient's care in the ED.  As part of my medical decision making, I reviewed the following data within the [place] notes reviewed and incorporated, Labs reviewed, notes from prior ED visits and [place] Controlled Substance Database   ____________________________________________   FINAL CLINICAL IMPRESSION(S) / ED DIAGNOSES  Final diagnoses:  ESRD (end stage renal disease) on dialysis ([place])  AV fistula ([place])      NEW MEDICATIONS STARTED DURING THIS VISIT:  New Prescriptions   No medications on file     Note:  This document was prepared using Dragon voice recognition software and may include unintentional dictation errors.    Merlyn Lot, MD 04/24/20 1231

## 2020-04-24 NOTE — ED Notes (Signed)
Spoke with Alejandro Armstrong in dialysis and patient will be able to come to 205-2 in one hour at 630p. Patient has to be on a stretcher when he goes

## 2020-04-25 DIAGNOSIS — T82868A Thrombosis of vascular prosthetic devices, implants and grafts, initial encounter: Secondary | ICD-10-CM | POA: Diagnosis not present

## 2020-04-25 LAB — GLUCOSE, CAPILLARY
Glucose-Capillary: 97 mg/dL (ref 70–99)
Glucose-Capillary: 128 mg/dL — ABNORMAL HIGH (ref 70–99)
Glucose-Capillary: 202 mg/dL — ABNORMAL HIGH (ref 70–99)

## 2020-04-25 LAB — BASIC METABOLIC PANEL
Anion gap: 9 (ref 5–15)
BUN: 34 mg/dL — ABNORMAL HIGH (ref 8–23)
CO2: 27 mmol/L (ref 22–32)
Calcium: 8.8 mg/dL — ABNORMAL LOW (ref 8.9–10.3)
Chloride: 104 mmol/L (ref 98–111)
Creatinine, Ser: 4.37 mg/dL — ABNORMAL HIGH (ref 0.61–1.24)
GFR, Estimated: 12 mL/min — ABNORMAL LOW (ref >60)
Glucose, Bld: 118 mg/dL — ABNORMAL HIGH (ref 70–99)
Potassium: 3.7 mmol/L (ref 3.5–5.1)
Sodium: 140 mmol/L (ref 135–145)

## 2020-04-25 LAB — CBC
HCT: 25.5 % — ABNORMAL LOW (ref 39.0–52.0)
Hemoglobin: 8.6 g/dL — ABNORMAL LOW (ref 13.0–17.0)
MCH: 33.3 pg (ref 26.0–34.0)
MCHC: 33.7 g/dL (ref 30.0–36.0)
MCV: 98.8 fL (ref 80.0–100.0)
Platelets: 125 10*3/uL — ABNORMAL LOW (ref 150–400)
RBC: 2.58 MIL/uL — ABNORMAL LOW (ref 4.22–5.81)
RDW: 14.5 % (ref 11.5–15.5)
WBC: 9.3 10*3/uL (ref 4.0–10.5)
nRBC: 0 % (ref 0.0–0.2)

## 2020-04-25 LAB — PHOSPHORUS: Phosphorus: 2.4 mg/dL — ABNORMAL LOW (ref 2.5–4.6)

## 2020-04-25 SURGERY — A/V SHUNTOGRAM
Anesthesia: Moderate Sedation

## 2020-04-25 MED ORDER — VITAMIN D (ERGOCALCIFEROL) 1.25 MG (50000 UNIT) PO CAPS
50000.0000 [IU] | ORAL_CAPSULE | ORAL | Status: DC
  Administered 2020-04-25: 17:00:00 50000 [IU] via ORAL
  Filled 2020-04-25: qty 1

## 2020-04-25 NOTE — Progress Notes (Addendum)
[facility]  ROUNDING NOTE   Subjective:   Patient sleeping in bed, arousable to call, in no acute distress. He is inquiring about vascular procedure today.   Objective:  Vital signs in last 24 hours:  Temp:  [97.6 F (36.4 C)-98.7 F (37.1 C)] 98.5 F (36.9 C) (11/10 0901) Pulse Rate:  [36-92] 92 (11/10 1239) Resp:  [11-19] 19 (11/10 0901) BP: (136-201)/(57-127) 163/97 (11/10 1239) SpO2:  [94 %-99 %] 94 % (11/10 1240)  Weight change:  Filed Weights   04/24/20 1032  Weight: 72.6 kg    Intake/Output: I/O last 3 completed shifts: In: -  Out: 325 [Urine:325]   Intake/Output this shift:  No intake/output data recorded.  Physical Exam: General: Sleeping in bed, in no acute distress   Head: Normocephalic,Atraumatic,Oral mucous membranes moist  Eyes: Anicteric  Lungs:  Lungs clear bilaterally, able to talk in full sentences,Respirations even,unlabored  Heart: S1S2, no rubs or gallops  Abdomen:  Soft, nontender, non distended  Extremities:  No  peripheral edema.  Neurologic: oriented, hard of hearing, Speech clear and appropriate  Skin: No lesions or rashes  Access: Rt IJ catheter,LUA AVF no thrill or bruit    Basic Metabolic Panel: Recent Labs  Lab 04/19/20 0413 04/19/20 0413 04/20/20 0523 04/24/20 1038 04/25/20 0456  NA 135  --  135 134* 140  K 4.3  --  4.5 5.0 3.7  CL 98  --  99 98 104  CO2 24  --  23 21* 27  GLUCOSE 255*  --  183* 225* 118*  BUN 64*  --  78* 74* 34*  CREATININE 6.03*  --  7.09* 7.63* 4.37*  CALCIUM 9.5   < > 10.3 9.8 8.8*  PHOS  --   --   --   --  2.4*   < > = values in this interval not displayed.    Liver Function Tests: Recent Labs  Lab 04/24/20 1038  AST 15  ALT 15  ALKPHOS 87  BILITOT 0.9  PROT 7.0  ALBUMIN 3.4*   No results for input(s): LIPASE, AMYLASE in the last 168 hours. No results for input(s): AMMONIA in the last 168 hours.  CBC: Recent Labs  Lab 04/19/20 0413 04/20/20 0523 04/24/20 1038  04/25/20 0456  WBC 8.2 10.5 12.1* 9.3  HGB 9.5* 9.6* 9.6* 8.6*  HCT 28.4* 28.4* 29.3* 25.5*  MCV 97.3 95.9 101.0* 98.8  PLT 143* 146* 163 125*    Cardiac Enzymes: No results for input(s): CKTOTAL, CKMB, CKMBINDEX, TROPONINI in the last 168 hours.  BNP: Invalid input(s): POCBNP  CBG: Recent Labs  Lab 04/20/20 1553 04/24/20 1752 04/24/20 2231 04/25/20 0902 04/25/20 1133  GLUCAP 195* 343* 135* 97 128*    Microbiology: Results for orders placed or performed during the hospital encounter of 04/12/20  Respiratory Panel by RT PCR (Flu A&B, Covid) - Nasopharyngeal Swab     Status: Abnormal   Collection Time: 04/12/20  3:56 PM   Specimen: Nasopharyngeal Swab  Result Value Ref Range Status   SARS Coronavirus 2 by RT PCR POSITIVE (A) NEGATIVE Final    Comment: RESULT CALLED TO, READ BACK BY AND VERIFIED WITH: [facility] REGISTER AT 1017 04/12/2020 BY MOSLEY,J (NOTE) SARS-CoV-2 target nucleic acids are DETECTED.  SARS-CoV-2 RNA is generally detectable in upper respiratory specimens  during the acute phase of infection. Positive results are indicative of the presence of the identified virus, but do not rule out bacterial infection or co-infection with other pathogens not detected by  the test. Clinical correlation with patient history and other diagnostic information is necessary to determine patient infection status. The expected result is Negative.  Fact Sheet for Patients:  PinkCheek.be  Fact Sheet for Healthcare Providers: GravelBags.it  This test is not yet approved or cleared by the [place] FDA and  has been authorized for detection and/or diagnosis of SARS-CoV-2 by FDA under an Emergency Use Authorization (EUA).  This EUA will remain in effect (meaning this te st can be used) for the duration of  the COVID-19 declaration under Section 564(b)(1) of the Act, 21 U.S.C. section 360bbb-3(b)(1), unless the  authorization is terminated or revoked sooner.      Influenza A by PCR NEGATIVE NEGATIVE Final   Influenza B by PCR NEGATIVE NEGATIVE Final    Comment: (NOTE) The Xpert Xpress SARS-CoV-2/FLU/RSV assay is intended as an aid in  the diagnosis of influenza from Nasopharyngeal swab specimens and  should not be used as a sole basis for treatment. Nasal washings and  aspirates are unacceptable for Xpert Xpress SARS-CoV-2/FLU/RSV  testing.  Fact Sheet for Patients: PinkCheek.be  Fact Sheet for Healthcare Providers: GravelBags.it  This test is not yet approved or cleared by the [place] FDA and  has been authorized for detection and/or diagnosis of SARS-CoV-2 by  FDA under an Emergency Use Authorization (EUA). This EUA will remain  in effect (meaning this test can be used) for the duration of the  Covid-19 declaration under Section 564(b)(1) of the Act, 21  U.S.C. section 360bbb-3(b)(1), unless the authorization is  terminated or revoked. Performed at [facility], [address] [place], [place] [address]   MRSA PCR Screening     Status: None   Collection Time: 04/18/20  3:37 PM   Specimen: Nasopharyngeal  Result Value Ref Range Status   MRSA by PCR NEGATIVE NEGATIVE Final    Comment:        The GeneXpert MRSA Assay (FDA approved for NASAL specimens only), is one component of a comprehensive MRSA colonization surveillance program. It is not intended to diagnose MRSA infection nor to guide or monitor treatment for MRSA infections. Performed at [facility], [address] [place], [place] [address]     Coagulation Studies: Recent Labs    04/24/20 09-12-16  LABPROT 12.1  INR 0.9    Urinalysis: No results for input(s): COLORURINE, LABSPEC, PHURINE, GLUCOSEU, HGBUR, BILIRUBINUR, KETONESUR, PROTEINUR, UROBILINOGEN, NITRITE, LEUKOCYTESUR in the last 72 hours.  Invalid input(s): APPERANCEUR     Imaging: DG Chest Portable 1 View  Result Date: 04/24/2020 CLINICAL DATA:  End-stage renal disease, edema EXAM: PORTABLE CHEST 1 VIEW COMPARISON:  Portable exam 1229 hours compared to 04/12/2020 FINDINGS: RIGHT jugular dual-lumen central venous catheter with tip projecting over RIGHT atrium. Enlargement of cardiac silhouette with slight pulmonary vascular congestion. Question hiatal hernia. Chronic accentuation of interstitial markings with subsegmental atelectasis at lung bases. No acute infiltrate, pleural effusion, or pneumothorax. Coronary stent noted as well as vascular stents at the LEFT subclavian region and at the LEFT axilla/proximal LEFT upper extremity. IMPRESSION: Enlargement of cardiac silhouette with pulmonary vascular congestion. Subsegmental atelectasis at lung bases without definite acute infiltrate. Electronically Signed   By: Lavonia Dana M.D.   On: 04/24/2020 13:08     Medications:    . ascorbic acid  500 mg Oral Daily  . atorvastatin  10 mg Oral Daily  . calcium acetate  1,334 mg Oral TID WC  . Chlorhexidine Gluconate Cloth  6 each Topical Q0600  .  furosemide  40 mg Oral BID  . hydrALAZINE  25 mg Oral BID  . insulin aspart  0-5 Units Subcutaneous QHS  . insulin aspart  0-9 Units Subcutaneous TID WC  . levothyroxine  125 mcg Oral QAC breakfast  . lisinopril  20 mg Oral Daily  . multivitamin  1 tablet Oral Daily  . omega-3 acid ethyl esters  2 g Oral BID  . predniSONE  20 mg Oral Q breakfast  . Vitamin D (Ergocalciferol)  50,000 Units Oral Q Wed  . zinc sulfate  220 mg Oral Daily   acetaminophen, albuterol, dextromethorphan-guaiFENesin, hydrALAZINE, lidocaine-prilocaine, ondansetron (ZOFRAN) IV, polyvinyl alcohol  Assessment/ Plan:  Mr. LEVII Armstrong is a 86 y.o. white male with end stage renal disease on hemodialysis, hypertension, diabetes mellitus type II, hypothyroidism, idiopathic thormbocytopenia purpura, and nephrolithiasis who was admitted to [facility] on  .04/24/2020  For End stage renal disease ([place]) [N18.6] ESRD (end stage renal disease) on dialysis ([place]) [N18.6, Z99.2] AV fistula thrombosis ([place]) [T82.868A] AV fistula ([place]) [I77.0]  [facility] [place] RT IJ catheter, Left AVF 73 kg  1.  Complication of dialysis access in setting of ESRD Patient received dialysis treatment at bedside yesterday, tolerated well Volume and electrolyte status acceptable No additional dialysis required today Patient is NPO for Angiogram and declotting of access today  2. Anemia of chronic kidney disease Lab Results  Component Value Date   HGB 8.6 (L) 04/25/2020   Continue Epogen with dialysis  3.  Secondary Hyperparathyroidism: Calcium 8.8 Phosphorus 2.4 PTH 132 on 04/12/2020 Patient is on Calcium Acetate   4.  Recent COVID-19 pneumonia Diagnosed on April 12, 2020 Maintain normal contact precautions      LOS: 0 Princy Raju 11/10/20211:19 PM

## 2020-04-25 NOTE — Progress Notes (Signed)
PT acutely nauseous and vomitted clear, minimal liquid 2x. Notified by tele that pt HR elevated as high as 180bpm, sustaining around 120BPM.   Pt asssessed and given IV zofran. BP 696 systolic, AO4, no other acute distress. HR has returned to 86bpm while in room.   MD notified via secure chat. No new orders at this time.

## 2020-04-25 NOTE — Plan of Care (Signed)
  Problem: Education: Goal: Knowledge of General Education information will improve Description: Including pain rating scale, medication(s)/side effects and non-pharmacologic comfort measures Outcome: Progressing   Problem: Health Behavior/Discharge Planning: Goal: Ability to manage health-related needs will improve Outcome: Progressing   Problem: Clinical Measurements: Goal: Ability to maintain clinical measurements within normal limits will improve Outcome: Progressing Goal: Will remain free from infection Outcome: Progressing Goal: Diagnostic test results will improve Outcome: Progressing Goal: Respiratory complications will improve Outcome: Progressing Goal: Cardiovascular complication will be avoided Outcome: Progressing   Problem: Activity: Goal: Risk for activity intolerance will decrease Outcome: Progressing   Problem: Nutrition: Goal: Adequate nutrition will be maintained Outcome: Progressing   Problem: Coping: Goal: Level of anxiety will decrease Outcome: Progressing   Problem: Elimination: Goal: Will not experience complications related to bowel motility Outcome: Progressing Goal: Will not experience complications related to urinary retention Outcome: Progressing   Problem: Pain Managment: Goal: General experience of comfort will improve Outcome: Progressing   Problem: Safety: Goal: Ability to remain free from injury will improve Outcome: Progressing   Problem: Skin Integrity: Goal: Risk for impaired skin integrity will decrease Outcome: Progressing   Problem: Education: Goal: Knowledge of risk factors and measures for prevention of condition will improve Outcome: Progressing   Problem: Education: Goal: Knowledge of risk factors and measures for prevention of condition will improve Outcome: Progressing   Problem: Coping: Goal: Psychosocial and spiritual needs will be supported Outcome: Progressing   Problem: Respiratory: Goal: Will maintain a  patent airway Outcome: Progressing Goal: Complications related to the disease process, condition or treatment will be avoided or minimized Outcome: Progressing

## 2020-04-25 NOTE — Progress Notes (Signed)
PIV removed. Will d/c to car with family. AVS explained. No further questions.

## 2020-04-25 NOTE — Discharge Summary (Signed)
Physician Discharge Summary   Alejandro Armstrong  male DOB: [birth year]  QQP:[id number]  PCP: Casilda Carls, MD  Admit date: 04/24/2020 Discharge date: 04/25/2020  Admitted From: home Disposition:  home Home Health: Yes CODE STATUS: DNR  Discharge Instructions    Diet - low sodium heart healthy   Complete by: As directed    No wound care   Complete by: As directed        Hospital Course:  For full details, please see H&P, progress notes, consult notes and ancillary notes.  Briefly,  Alejandro Armstrong is a 86 y.o. male with medical history significant of ESRD-HD (MWF), HTN, HLD, DM, hypothyroidism, ITP, recent COVID-19 pneumonia 04/07/2020, who presented with AV fistula thrombosis.  Per report, pt has AVF thrombus formation. He was scheduled for outpatient AV fistulogram. Unfortunately patient missed his appointment for the fistula gram. He has right PermCath.  He was sent for dialysis today as a scheduled dialysis, but was sent to the ER.  Pt has worsened recently hospitalized from 10/23-11/5 due to COVID-19 pneumonia.  He is currently on tapering dose of prednisone.  He has generalized weakness, but denied  cough or shortness of breath.  No fever or chills. Per Dr. Candiss Norse of nephrology, outpt dialysis reported of blood in his sputum.  Pt denied having hemoptysis.  No hemoptysis was observed during hospitalization.  Pt was admitted as observation status.  AV fistula thrombosis (HCC) Pt was originally scheduled for vascular thrombectomy for 11/10, however, due to procedures running late, decision was made to reschedule pt's thrombectomy as an outpatient procedure during the week of 11/22 after his 21 days of COVID isolation period.  Pt has a working Chief Technology Officer.  Hx of Idiopathic thrombocytopenic purpura ([place]):  Stable.    Type II diabetes mellitus with renal manifestations [facility]):  Recent A1c 6.9, not taking medications at home.    Recent pneumonia due to COVID-19  virus:  Oxygen saturation 96% on room air.  No cough or shortness breath.  Continued prednisone tapering, currently on 20 g daily.  Leukocytosis:  WBC 12.1, no source of infection identified.  Likely due to steroid use.  Anemia in ESRD (end-stage renal disease) ([place]):  Hemoglobin 9.6 (9.6 on 04/20/2020), stable  ESRD-HD (MWF) Pt received dialysis via PermCath on 04/24/20 without issue.    HTN: Continued home hydralazine, Lasix, lisinopril and metop.  HLD: Continued home lipitor   Discharge Diagnoses:  Principal Problem:   AV fistula thrombosis ([place]) Active Problems:   Idiopathic thrombocytopenic purpura (HCC)   Type II diabetes mellitus with renal manifestations (HCC)   Hyperlipidemia   ESRD (end stage renal disease) on dialysis ([place])   Essential hypertension   Pneumonia due to COVID-19 virus   Leukocytosis   Hemoptysis   Anemia in ESRD (end-stage renal disease) ([place])    Discharge Instructions:  Allergies as of 04/25/2020   No Known Allergies     Medication List    STOP taking these medications   ascorbic acid 500 MG tablet Commonly known as: VITAMIN C   zinc sulfate 220 (50 Zn) MG capsule     TAKE these medications   acetaminophen 500 MG tablet Commonly known as: TYLENOL Take 1,000 mg by mouth every 6 (six) hours as needed for moderate pain or headache.   albuterol 108 (90 Base) MCG/ACT inhaler Commonly known as: VENTOLIN HFA Inhale 2 puffs into the lungs every 6 (six) hours.   atorvastatin 10 MG tablet Commonly known as: LIPITOR Take 10 mg  by mouth daily.   calcium acetate 667 MG capsule Commonly known as: PHOSLO Take 2 capsules (1,334 mg total) by mouth 3 (three) times daily with meals.   furosemide 40 MG tablet Commonly known as: LASIX Take 40 mg by mouth 2 (two) times daily.   hydrALAZINE 25 MG tablet Commonly known as: APRESOLINE Take 25 mg by mouth 2 (two) times daily.   levothyroxine 125 MCG tablet Commonly known as:  SYNTHROID Take 125 mcg by mouth daily before breakfast.   lidocaine-prilocaine cream Commonly known as: EMLA Apply 1 application topically daily as needed (port access).   lisinopril 20 MG tablet Commonly known as: ZESTRIL Take 20 mg by mouth daily.   metoprolol tartrate 25 MG tablet Commonly known as: LOPRESSOR Take 0.5 tablets (12.5 mg total) by mouth 2 (two) times daily.   Omega-3 1000 MG Caps Take 2,000 mg by mouth 2 (two) times daily.   predniSONE 20 MG tablet Commonly known as: DELTASONE Take 2 tablets (40 mg total) by mouth daily with breakfast for 3 days, THEN 1 tablet (20 mg total) daily with breakfast for 3 days, THEN 0.5 tablets (10 mg total) daily with breakfast for 3 days. Start taking on: April 20, 2020   Rena-Vite Rx 1 MG Tabs Take 1 tablet by mouth daily.   Theratears 0.25 % Soln Generic drug: Carboxymethylcellulose Sodium Place 1-2 drops into both eyes 3 (three) times daily as needed (for dry eyes).   Vitamin D3 1.25 MG (50000 UT) Caps Take 50,000 Units by mouth every Wednesday.        Follow-up Information    Casilda Carls, MD. Schedule an appointment as soon as possible for a visit in 1 week(s).   Specialty: Internal Medicine Contact information: [address] [place] [address] [phone]               No Known Allergies   The results of significant diagnostics from this hospitalization (including imaging, microbiology, ancillary and laboratory) are listed below for reference.   Consultations:   Procedures/Studies: PERIPHERAL VASCULAR CATHETERIZATION  Result Date: 04/16/2020 See op note  DG Chest Portable 1 View  Result Date: 04/24/2020 CLINICAL DATA:  End-stage renal disease, edema EXAM: PORTABLE CHEST 1 VIEW COMPARISON:  Portable exam 1229 hours compared to 04/12/2020 FINDINGS: RIGHT jugular dual-lumen central venous catheter with tip projecting over RIGHT atrium. Enlargement of cardiac silhouette with slight  pulmonary vascular congestion. Question hiatal hernia. Chronic accentuation of interstitial markings with subsegmental atelectasis at lung bases. No acute infiltrate, pleural effusion, or pneumothorax. Coronary stent noted as well as vascular stents at the LEFT subclavian region and at the LEFT axilla/proximal LEFT upper extremity. IMPRESSION: Enlargement of cardiac silhouette with pulmonary vascular congestion. Subsegmental atelectasis at lung bases without definite acute infiltrate. Electronically Signed   By: Lavonia Dana M.D.   On: 04/24/2020 13:08   DG Chest Portable 1 View  Result Date: 04/12/2020 CLINICAL DATA:  COVID positive, ESRD. EXAM: PORTABLE CHEST 1 VIEW COMPARISON:  Nov 04, 2019 FINDINGS: Stable cardiomediastinal silhouette. Subtle opacities in the left lateral lung base. No visible pleural effusions or pneumothorax. Hiatal hernia. Vascular stents in the left subclavian left axillary regions. IMPRESSION: Subtle opacities in the left lateral lung base, which may represent early pneumonia given reported COVID diagnosis. Electronically Signed   By: Margaretha Sheffield MD   On: 04/12/2020 15:53   VAS [place] (AVF, AVG)  Result Date: 04/05/2020 DIALYSIS ACCESS Reason for Exam: Routine follow up. Access  Site: Left Upper Extremity. Access Type: Brachial/Axial AVG. History: 09/07/2018 Central venous PTA stent, mid graft PTA          04/16/16: Left brachial-axillary AVG;          05/13/16: Left brachia & radial artery PTAs for steal;          03/31/17: Outflow vein PTA/stent;          03/24/18: PTA stricture of mid AVG          07/28/17: Peripheral segment PTA/stent with central venous PTA;. Comparison Study: 02/16/2020 Performing Technologist: Charlane Ferretti RT (R)(VS)  Examination Guidelines: A complete evaluation includes B-mode imaging, spectral Doppler, color Doppler, and power Doppler as needed of all accessible portions of each vessel. Unilateral testing is considered an integral  part of a complete examination. Limited examinations for reoccurring indications may be performed as noted.  Findings:  +--------------------+----------+-----------------+----------------------------+ AVG                 PSV (cm/s)Flow Vol (mL/min)          Describe           +--------------------+----------+-----------------+----------------------------+ Native artery inflow   113          1046                                    +--------------------+----------+-----------------+----------------------------+ Arterial anastomosis   105                                                  +--------------------+----------+-----------------+----------------------------+ Prox graft             103                                                  +--------------------+----------+-----------------+----------------------------+ Mid graft              169                                                  +--------------------+----------+-----------------+----------------------------+ Distal graft           355                     change in diameter from .68                                                          cm -1.17cm          +--------------------+----------+-----------------+----------------------------+ Venous anastomosis     479                           Narrowing vessel       +--------------------+----------+-----------------+----------------------------+ Venous outflow         315                                                  +--------------------+----------+-----------------+----------------------------+ +---------------+-------------+---------+---------+----------+-----------------+  Diameter (cm)  Depth  BranchingPSV (cm/s)   Flow Volume                                  (cm)                          (ml/min)      +---------------+-------------+---------+---------+----------+-----------------+ Left Radial                                        53                      Artery                                                                    +---------------+-------------+---------+---------+----------+-----------------+ Summary: Patent arteriovenous graft. *See table(s) above for measurements and observations.  Diagnosing physician: Hortencia Pilar MD Electronically signed by Hortencia Pilar MD on 04/05/2020 at 5:27:04 PM.    --------------------------------------------------------------------------------   Final    ECHOCARDIOGRAM COMPLETE  Result Date: 04/17/2020    ECHOCARDIOGRAM REPORT   Patient Name:   Alejandro Armstrong [facility] Date of Exam: 04/17/2020 Medical Rec #:  [id number]           Height:       67.5 in Accession #:    [id number]          Weight:       160.0 lb Date of Birth:  [birth year]           BSA:          1.849 m Patient Age:    81 years            BP:           123/58 mmHg Patient Gender: M                   HR:           56 bpm. Exam Location:  [facility] Procedure: 2D Echo, Color Doppler and Cardiac Doppler Indications:     I48.91 Atrial Fibrillation  History:         Patient has prior history of Echocardiogram examinations. Risk                  Factors:Hypertension, Diabetes and Dyslipidemia. Pt tested                  positive for COVID-19 on 04/12/20.  Sonographer:     Charmayne Sheer RDCS (AE) Referring Phys:  [id number] Bonnielee Haff Diagnosing Phys: Serafina Royals MD  Sonographer Comments: Image acquisition challenging due to uncooperative patient. IMPRESSIONS  1. Left ventricular ejection fraction, by estimation, is 50 to 55%. The left ventricle has low normal function. The left ventricle has no regional wall motion abnormalities. Left ventricular diastolic parameters were normal.  2. Right ventricular systolic function is normal. The right ventricular size is normal.  3. The mitral valve is normal in structure. Mild mitral valve regurgitation.  4. The aortic valve is normal in structure. Aortic valve regurgitation is  trivial.  FINDINGS  Left Ventricle: Left ventricular ejection fraction, by estimation, is 50 to 55%. The left ventricle has low normal function. The left ventricle has no regional wall motion abnormalities. The left ventricular internal cavity size was normal in size. There is no left ventricular hypertrophy. Left ventricular diastolic parameters were normal. Right Ventricle: The right ventricular size is normal. No increase in right ventricular wall thickness. Right ventricular systolic function is normal. Left Atrium: Left atrial size was normal in size. Right Atrium: Right atrial size was normal in size. Pericardium: There is no evidence of pericardial effusion. Mitral Valve: The mitral valve is normal in structure. Mild mitral valve regurgitation. MV peak gradient, 6.1 mmHg. The mean mitral valve gradient is 2.0 mmHg. Tricuspid Valve: The tricuspid valve is normal in structure. Tricuspid valve regurgitation is mild. Aortic Valve: The aortic valve is normal in structure. Aortic valve regurgitation is trivial. Aortic valve mean gradient measures 5.0 mmHg. Aortic valve peak gradient measures 9.7 mmHg. Aortic valve area, by VTI measures 2.46 cm. Pulmonic Valve: The pulmonic valve was normal in structure. Pulmonic valve regurgitation is not visualized. Aorta: The aortic root and ascending aorta are structurally normal, with no evidence of dilitation. IAS/Shunts: No atrial level shunt detected by color flow Doppler.  LEFT VENTRICLE PLAX 2D LVIDd:         5.06 cm  Diastology LVIDs:         2.72 cm  LV e' medial:    7.07 cm/s LV PW:         1.27 cm  LV E/e' medial:  11.2 LV IVS:        0.87 cm  LV e' lateral:   7.83 cm/s LVOT diam:     2.10 cm  LV E/e' lateral: 10.1 LV SV:         88 LV SV Index:   48 LVOT Area:     3.46 cm  RIGHT VENTRICLE RV Basal diam:  3.47 cm LEFT ATRIUM             Index LA diam:        4.80 cm 2.60 cm/m LA Vol (A2C):   41.6 ml 22.49 ml/m LA Vol (A4C):   67.2 ml 36.33 ml/m LA Biplane Vol: 56.4 ml 30.50  ml/m  AORTIC VALVE AV Area (Vmax):    2.73 cm AV Area (Vmean):   2.62 cm AV Area (VTI):     2.46 cm AV Vmax:           156.00 cm/s AV Vmean:          103.000 cm/s AV VTI:            0.358 m AV Peak Grad:      9.7 mmHg AV Mean Grad:      5.0 mmHg LVOT Vmax:         123.00 cm/s LVOT Vmean:        77.800 cm/s LVOT VTI:          0.254 m LVOT/AV VTI ratio: 0.71  AORTA Ao Root diam: 3.40 cm MITRAL VALVE MV Area (PHT): 2.95 cm     SHUNTS MV Peak grad:  6.1 mmHg     Systemic VTI:  0.25 m MV Mean grad:  2.0 mmHg     Systemic Diam: 2.10 cm MV Vmax:       1.23 m/s MV Vmean:      67.0 cm/s MV Decel Time: 257 msec MV E velocity: 79.10 cm/s MV  A velocity: 107.00 cm/s MV E/A ratio:  0.74 Serafina Royals MD Electronically signed by Serafina Royals MD Signature Date/Time: 04/17/2020/1:49:14 PM    Final       Labs: BNP (last 3 results) No results for input(s): BNP in the last 8760 hours. Basic Metabolic Panel: Recent Labs  Lab 04/19/20 0413 04/20/20 0523 04/24/20 1038 04/25/20 0456  NA 135 135 134* 140  K 4.3 4.5 5.0 3.7  CL 98 99 98 104  CO2 24 23 21* 27  GLUCOSE 255* 183* 225* 118*  BUN 64* 78* 74* 34*  CREATININE 6.03* 7.09* 7.63* 4.37*  CALCIUM 9.5 10.3 9.8 8.8*  PHOS  --   --   --  2.4*   Liver Function Tests: Recent Labs  Lab 04/24/20 1038  AST 15  ALT 15  ALKPHOS 87  BILITOT 0.9  PROT 7.0  ALBUMIN 3.4*   No results for input(s): LIPASE, AMYLASE in the last 168 hours. No results for input(s): AMMONIA in the last 168 hours. CBC: Recent Labs  Lab 04/19/20 0413 04/20/20 0523 04/24/20 1038 04/25/20 0456  WBC 8.2 10.5 12.1* 9.3  HGB 9.5* 9.6* 9.6* 8.6*  HCT 28.4* 28.4* 29.3* 25.5*  MCV 97.3 95.9 101.0* 98.8  PLT 143* 146* 163 125*   Cardiac Enzymes: No results for input(s): CKTOTAL, CKMB, CKMBINDEX, TROPONINI in the last 168 hours. BNP: Invalid input(s): POCBNP CBG: Recent Labs  Lab 04/20/20 1553 04/24/20 1752 04/24/20 2231 04/25/20 0902 04/25/20 1133  GLUCAP 195* 343*  135* 97 128*   D-Dimer No results for input(s): DDIMER in the last 72 hours. Hgb A1c No results for input(s): HGBA1C in the last 72 hours. Lipid Profile No results for input(s): CHOL, HDL, LDLCALC, TRIG, CHOLHDL, LDLDIRECT in the last 72 hours. Thyroid function studies No results for input(s): TSH, T4TOTAL, T3FREE, THYROIDAB in the last 72 hours.  Invalid input(s): FREET3 Anemia work up No results for input(s): VITAMINB12, FOLATE, FERRITIN, TIBC, IRON, RETICCTPCT in the last 72 hours. Urinalysis    Component Value Date/Time   COLORURINE YELLOW (A) 04/12/2020 1740   APPEARANCEUR CLOUDY (A) 04/12/2020 1740   APPEARANCEUR Cloudy 10/15/2012 2114   LABSPEC 1.009 04/12/2020 1740   LABSPEC 1.014 10/15/2012 2114   PHURINE 8.0 04/12/2020 1740   GLUCOSEU >=500 (A) 04/12/2020 1740   GLUCOSEU Negative 10/15/2012 2114   HGBUR SMALL (A) 04/12/2020 1740   BILIRUBINUR NEGATIVE 04/12/2020 1740   BILIRUBINUR Negative 10/15/2012 2114   [place] 04/12/2020 1740   PROTEINUR 100 (A) 04/12/2020 1740   NITRITE NEGATIVE 04/12/2020 1740   LEUKOCYTESUR MODERATE (A) 04/12/2020 1740   LEUKOCYTESUR 3+ 10/15/2012 2114   Sepsis Labs Invalid input(s): PROCALCITONIN,  WBC,  LACTICIDVEN Microbiology Recent Results (from the past 240 hour(s))  MRSA PCR Screening     Status: None   Collection Time: 04/18/20  3:37 PM   Specimen: Nasopharyngeal  Result Value Ref Range Status   MRSA by PCR NEGATIVE NEGATIVE Final    Comment:        The GeneXpert MRSA Assay (FDA approved for NASAL specimens only), is one component of a comprehensive MRSA colonization surveillance program. It is not intended to diagnose MRSA infection nor to guide or monitor treatment for MRSA infections. Performed at [facility], [address] [place], [place] [address]      Total time spend on discharging this patient, including the last patient exam, discussing the hospital stay, instructions for ongoing  care as it relates to all pertinent caregivers, as well  as preparing the medical discharge records, prescriptions, and/or referrals as applicable, is 45 minutes.    Enzo Bi, MD  [facility] 04/25/2020, 4:15 PM

## 2020-04-26 ENCOUNTER — Encounter: Payer: Self-pay | Admitting: Emergency Medicine

## 2020-04-26 ENCOUNTER — Emergency Department: Payer: Medicare HMO

## 2020-04-26 ENCOUNTER — Emergency Department
Admission: EM | Admit: 2020-04-26 | Discharge: 2020-04-26 | Disposition: A | Discharge status: Home / Self Care | Payer: Medicare HMO | Attending: Emergency Medicine | Admitting: Emergency Medicine

## 2020-04-26 ENCOUNTER — Other Ambulatory Visit: Payer: Self-pay

## 2020-04-26 DIAGNOSIS — E039 Hypothyroidism, unspecified: Secondary | ICD-10-CM | POA: Diagnosis not present

## 2020-04-26 DIAGNOSIS — Z992 Dependence on renal dialysis: Secondary | ICD-10-CM | POA: Insufficient documentation

## 2020-04-26 DIAGNOSIS — R001 Bradycardia, unspecified: Secondary | ICD-10-CM | POA: Diagnosis present

## 2020-04-26 DIAGNOSIS — I120 Hypertensive chronic kidney disease with stage 5 chronic kidney disease or end stage renal disease: Secondary | ICD-10-CM | POA: Diagnosis not present

## 2020-04-26 DIAGNOSIS — Z79899 Other long term (current) drug therapy: Secondary | ICD-10-CM | POA: Diagnosis not present

## 2020-04-26 DIAGNOSIS — Z8616 Personal history of COVID-19: Secondary | ICD-10-CM | POA: Diagnosis not present

## 2020-04-26 DIAGNOSIS — E1122 Type 2 diabetes mellitus with diabetic chronic kidney disease: Secondary | ICD-10-CM | POA: Diagnosis not present

## 2020-04-26 DIAGNOSIS — N186 End stage renal disease: Secondary | ICD-10-CM | POA: Insufficient documentation

## 2020-04-26 LAB — CBC
HCT: 30.7 % — ABNORMAL LOW (ref 39.0–52.0)
Hemoglobin: 9.9 g/dL — ABNORMAL LOW (ref 13.0–17.0)
MCH: 32.8 pg (ref 26.0–34.0)
MCHC: 32.2 g/dL (ref 30.0–36.0)
MCV: 101.7 fL — ABNORMAL HIGH (ref 80.0–100.0)
Platelets: 147 10*3/uL — ABNORMAL LOW (ref 150–400)
RBC: 3.02 MIL/uL — ABNORMAL LOW (ref 4.22–5.81)
RDW: 15.4 % (ref 11.5–15.5)
WBC: 8.7 10*3/uL (ref 4.0–10.5)
nRBC: 0 % (ref 0.0–0.2)

## 2020-04-26 LAB — BASIC METABOLIC PANEL
Anion gap: 12 (ref 5–15)
BUN: 26 mg/dL — ABNORMAL HIGH (ref 8–23)
CO2: 26 mmol/L (ref 22–32)
Calcium: 9.2 mg/dL (ref 8.9–10.3)
Chloride: 98 mmol/L (ref 98–111)
Creatinine, Ser: 3.86 mg/dL — ABNORMAL HIGH (ref 0.61–1.24)
GFR, Estimated: 14 mL/min — ABNORMAL LOW (ref >60)
Glucose, Bld: 209 mg/dL — ABNORMAL HIGH (ref 70–99)
Potassium: 4.6 mmol/L (ref 3.5–5.1)
Sodium: 136 mmol/L (ref 135–145)

## 2020-04-26 LAB — MAGNESIUM: Magnesium: 2.1 mg/dL (ref 1.7–2.4)

## 2020-04-26 LAB — TSH: TSH: 2.326 u[IU]/mL (ref 0.350–4.500)

## 2020-04-26 LAB — TROPONIN I (HIGH SENSITIVITY): Troponin I (High Sensitivity): 16 ng/L (ref <18)

## 2020-04-26 NOTE — ED Notes (Signed)
Wife nancy contacted to pick up patient for DC. States She will be here in about 15 minutes.

## 2020-04-26 NOTE — ED Provider Notes (Signed)
[facility] Emergency Department Provider Note   ____________________________________________    I have reviewed the triage vital signs and the nursing notes.   HISTORY  Chief Complaint Bradycardia  Patient is very hard of hearing patient is very hard of hearing   HPI Alejandro Armstrong is a 86 y.o. male with end-stage renal disease who received dialysis today, was sent to the ED for evaluation for reported low heart rates.  Patient reports he feels quite well.  He denies dizziness or lightheadedness.  No chest pain.  No nausea or vomiting.  Review of medical records demonstrates that he was recently in the hospital and discharged yesterday.  He was treated for AV fistula thrombosis   Past Medical History:  Diagnosis Date  . Arthritis   . Dialysis patient [facility])    Mon, Wed Fri  . Hyperlipidemia   . Hypertension   . Hypothyroidism   . Idiopathic thrombocytopenia purpura ([place])   . Kidney stones   . Recurrent UTI   . Renal agenesis    discovered at age 89  . Type 2 diabetes mellitus ([place])   . Ureteral stricture     Patient Active Problem List   Diagnosis Date Noted  . 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 Leukocytosis 04/24/2020  . Anemia in ESRD (end-stage renal disease) ([place]) 04/24/2020  . Acute hypoxemic respiratory failure ([place]) 04/18/2020  . Pneumonia due to COVID-19 virus   . Generalized weakness   . Recurrent UTI 02/16/2020  . Chest pain 11/04/2019  . Complication of vascular access for dialysis 11/20/2017  . Hyperlipidemia 03/26/2017  . ESRD (end stage renal disease) on dialysis ([place]) 03/26/2017  . Postop check 04/30/2016  . Fever chills 09/18/2015  . Bacteremia 09/18/2015  . Acute on chronic renal insufficiency 09/01/2015  . Hyperkalemia 09/01/2015  . MRSA bacteremia secondary to PICC line infection 09/01/2015  . Type II diabetes mellitus with renal manifestations ([place]) 09/01/2015  . UTI (lower urinary tract infection)  08/06/2015  . Idiopathic thrombocytopenic purpura ([place]) 08/02/2015  . B12 deficiency 07/24/2015  . Pseudomonas infection 04/28/2014  . 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 Urinary urgency 02/21/2013  . Hypertrophy of prostate with urinary obstruction and other lower urinary tract symptoms (LUTS) 11/18/2012  . Calculus of kidney 03/12/2012  . Increased frequency of urination 03/12/2012  . Nocturia 03/12/2012  . 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 Urethral stricture 03/12/2012  . Urinary tract infection 03/12/2012  . Essential hypertension 03/31/2011  . Hypothyroidism 03/31/2011  . Chronic kidney disease, stage IV (severe) ([place]) 03/31/2011    Past Surgical History:  Procedure Laterality Date  . A/V FISTULAGRAM Left 03/31/2017   Procedure: A/V Fistulagram;  Surgeon: [name], MD;  Location: [place] [facility];  Service: Cardiovascular;  Laterality: Left;  . A/V FISTULAGRAM Left 09/07/2018   Procedure: A/V FISTULAGRAM;  Surgeon: [name], MD;  Location: [place] [facility];  Service: Cardiovascular;  Laterality: Left;  . A/V SHUNTOGRAM Left 07/28/2017   Procedure: A/V SHUNTOGRAM;  Surgeon: [name], MD;  Location: [place] [facility];  Service: Cardiovascular;  Laterality: Left;  . A/V SHUNTOGRAM Left 03/24/2018   Procedure: A/V SHUNTOGRAM;  Surgeon: [name], MD;  Location: [place] [facility];  Service: Cardiovascular;  Laterality: Left;  . APPENDECTOMY    . AV FISTULA PLACEMENT Left 04/16/2016   Procedure: INSERTION OF ARTERIOVENOUS (AV) GORE-TEX GRAFT ARM;  Surgeon: [name], MD;  Location: [facility]  ORS;  Service: Vascular;  Laterality: Left;  . CATARACT EXTRACTION W/ INTRAOCULAR LENS IMPLANT Bilateral 2014   done 1-2 months apart at [facility].  [name] FINGER SURGERY Left 2002   pinky finger  . KIDNEY STONE SURGERY    . KYPHOPLASTY [name] 09/16/2018   Procedure: KYPHOPLASTY L3, DIABETIC;  Surgeon: Hessie Knows, MD;  Location: [facility]  ORS;  Service: Orthopedics;  Laterality: [name];  . NASAL SINUS SURGERY  1985  . PERIPHERAL VASCULAR CATHETERIZATION  05/13/2016   Procedure: Upper Extremity Angiography;  Surgeon: [name], MD;  Location: [place] [facility];  Service: Cardiovascular;;  . PERIPHERAL VASCULAR CATHETERIZATION  05/13/2016   Procedure: Upper Extremity Intervention;  Surgeon: [name], MD;  Location: [place] [facility];  Service: Cardiovascular;;  . PERIPHERAL VASCULAR THROMBECTOMY [name] 04/16/2020   Procedure: PERIPHERAL VASCULAR THROMBECTOMY, possible permcath placement;  Surgeon: Algernon Huxley, MD;  Location: [place] [facility];  Service: Cardiovascular;  Laterality: [name];  . PICC LINE PLACE PERIPHERAL ([place] HX) Right 08/2015  . PICC LINE REMOVAL ([place] HX) Right 09/2015    Prior to Admission medications   Medication Sig Start Date End Date Taking? Authorizing Provider  acetaminophen (TYLENOL) 500 MG tablet Take 1,000 mg by mouth every 6 (six) hours as needed for moderate pain or headache.     [provider]  albuterol (VENTOLIN HFA) 108 (90 Base) MCG/ACT inhaler Inhale 2 puffs into the lungs every 6 (six) hours. 04/19/20   Sharen Hones, MD  atorvastatin (LIPITOR) 10 MG tablet Take 10 mg by mouth daily.    [provider]  B Complex-C-Folic Acid (RENA-VITE RX) 1 MG TABS Take 1 tablet by mouth daily. 04/16/20   [provider]  calcium acetate (PHOSLO) 667 MG capsule Take 2 capsules (1,334 mg total) by mouth 3 (three) times daily with meals. 04/19/20   Sharen Hones, MD  Carboxymethylcellulose Sodium (THERATEARS) 0.25 % SOLN Place 1-2 drops into both eyes 3 (three) times daily as needed (for dry eyes).    [provider]  Cholecalciferol (VITAMIN D3) 1.25 MG (50000 UT) CAPS Take 50,000 Units by mouth every Wednesday.     [provider]  furosemide (LASIX) 40 MG tablet Take 40 mg by mouth 2 (two) times daily.     [provider]  hydrALAZINE  (APRESOLINE) 25 MG tablet Take 25 mg by mouth 2 (two) times daily.    [provider]  levothyroxine (SYNTHROID) 125 MCG tablet Take 125 mcg by mouth daily before breakfast.     [provider]  lidocaine-prilocaine (EMLA) cream Apply 1 application topically daily as needed (port access).  10/03/16   [provider]  lisinopril (PRINIVIL,ZESTRIL) 20 MG tablet Take 20 mg by mouth daily.  06/24/12   [provider]  metoprolol tartrate (LOPRESSOR) 25 MG tablet Take 0.5 tablets (12.5 mg total) by mouth 2 (two) times daily. 04/19/20   Sharen Hones, MD  Omega-3 1000 MG CAPS Take 2,000 mg by mouth 2 (two) times daily.     [provider]  predniSONE (DELTASONE) 20 MG tablet Take 2 tablets (40 mg total) by mouth daily with breakfast for 3 days, THEN 1 tablet (20 mg total) daily with breakfast for 3 days, THEN 0.5 tablets (10 mg total) daily with breakfast for 3 days. 04/20/20 04/29/20  Sharen Hones, MD     Allergies Patient has no known allergies.  Family History  Problem Relation Age of Onset  . Cervical cancer Sister   .  CAD Father     Social History Social History   Tobacco Use  . Smoking status: Never Smoker  . Smokeless tobacco: Never Used  Vaping Use  . Vaping Use: Never used  Substance Use Topics  . Alcohol use: No    Alcohol/week: 0.0 standard drinks  . Drug use: No    Review of Systems  Constitutional: No fever/chills Eyes: No visual changes.  ENT: No sore throat. Cardiovascular: Denies chest pain. Respiratory: Denies shortness of breath. Gastrointestinal: No abdominal pain.   Genitourinary: Negative for dysuria. Musculoskeletal: Negative for back pain. Skin: Negative for rash. Neurological: Negative for headaches or weakness   ____________________________________________   PHYSICAL EXAM:  VITAL SIGNS: ED Triage Vitals  Enc Vitals Group     BP 04/26/20 1441 (!) 167/89     Pulse Rate 04/26/20 1441 (!) 49     Resp  04/26/20 1441 18     Temp 04/26/20 1441 97.8 F (36.6 C)     Temp Source 04/26/20 1441 Oral     SpO2 04/26/20 1441 98 %     Weight 04/26/20 1557 72.6 kg (160 lb 0.9 oz)     Height 04/26/20 1557 1.715 m (5' 7.5")     Head Circumference --      Peak Flow --      Pain Score 04/26/20 1444 0     Pain Loc --      Pain Edu? --      Excl. in [place]? --     Constitutional: Alert and oriented. No acute distress. Eyes: Conjunctivae are normal.  Head: Atraumatic. a. Mouth/Throat: Mucous membranes are moist.   Neck:  Painless ROM Cardiovascular: Mild bradycardia, regular rhythm. Grossly normal heart sounds.  Good peripheral circulation. Respiratory: Normal respiratory effort.  No retractions. Lungs CTAB. Gastrointestinal: Soft and nontender. No distention.   Musculoskeletal: No lower extremity tenderness nor edema.  Warm and well perfused Neurologic:  Normal speech and language. No gross focal neurologic deficits are appreciated.  Skin:  Skin is warm, dry and intact. No rash noted. Psychiatric: Mood and affect are normal. Speech and behavior are normal.  ____________________________________________   LABS (all labs ordered are listed, but only abnormal results are displayed)  Labs Reviewed  BASIC METABOLIC PANEL - Abnormal; Notable for the following components:      Result Value   Glucose, Bld 209 (*)    BUN 26 (*)    Creatinine, Ser 3.86 (*)    GFR, Estimated 14 (*)    All other components within normal limits  CBC - Abnormal; Notable for the following components:   RBC 3.02 (*)    Hemoglobin 9.9 (*)    HCT 30.7 (*)    MCV 101.7 (*)    Platelets 147 (*)    All other components within normal limits  MAGNESIUM  TSH  TROPONIN I (HIGH SENSITIVITY)   ____________________________________________  EKG  ED ECG REPORT I, Lavonia Drafts, the attending physician, personally viewed and interpreted this ECG.  Date: 04/26/2020  Rhythm: Sinus bradycardia QRS Axis: normal Intervals:  normal ST/T Wave abnormalities: normal Narrative Interpretation: no evidence of acute ischemia  ____________________________________________  RADIOLOGY  Chest x-ray reviewed by me, no infiltrate or effusion ____________________________________________   PROCEDURES  Procedure(s) performed: No  Procedures   Critical Care performed: No ____________________________________________   INITIAL IMPRESSION / ASSESSMENT AND PLAN / ED COURSE  Pertinent labs & imaging results that were available during my care of the patient were reviewed by me and considered  in my medical decision making (see chart for details).  Patient presents with asymptomatic bradycardia, heart rate here is trending between 50 and 70.  No dizziness or lightheadedness.  No chest pain.  Lab work today is reassuring, elevated BUN/creatinine consistent with his end-stage renal disease, potassium is normal.  Hemoglobin is in line with prior levels, blood pressure is consistent with prior levels.  He is not entirely sure why he is here, he feels well and does not want to stay in the hospital.  ----------------------------------------- 4:35 PM on 04/26/2020 -----------------------------------------  Recheck heart rate, pulse 64, patient remains asymptomatic.  No indication for admission at this time, electrolytes are overall reassuring, no chest pain appropriate for outpatient follow-up with cardiology.    ____________________________________________   FINAL CLINICAL IMPRESSION(S) / ED DIAGNOSES  Final diagnoses:  Bradycardia, unspecified        Note:  This document was prepared using Dragon voice recognition software and may include unintentional dictation errors.   Lavonia Drafts, MD 04/26/20 1728

## 2020-04-26 NOTE — ED Triage Notes (Addendum)
Pt comes into the ED via [facility] from dialysis where he was found to bradycardic between 40-50's.  Pt states he recently had COVID but cant explain when.  Pt denies any pain, CP, SHOB, weakness or dizziness.  Pt has even and unlabored respirations at this time and is alert. Denies any history of bradycardia.

## 2020-04-26 NOTE — ED Notes (Signed)
Patient is difficult stick, multiple unsuccessful blood draw attempts per triage RN.  Lab to triage at this time to collect necessary specimens.

## 2020-04-26 NOTE — ED Triage Notes (Signed)
Pt in via EMS from dialysis. EMS reports pt was sent for evaluation of bradycardia. HR 40-50's. Pt denies pain or other sx's. EMS reports hx of bing covid+ but does not recall for how long, 181/80, 98% RA, pt is HOH.

## 2020-04-30 ENCOUNTER — Telehealth (INDEPENDENT_AMBULATORY_CARE_PROVIDER_SITE_OTHER): Payer: Self-pay

## 2020-04-30 NOTE — Telephone Encounter (Signed)
I have attempted to contact the patient and his wife and I am unable to get through to either of them regarding the patient's procedure that has been scheduled.

## 2020-05-01 ENCOUNTER — Encounter (INDEPENDENT_AMBULATORY_CARE_PROVIDER_SITE_OTHER): Payer: Self-pay

## 2020-05-01 NOTE — Telephone Encounter (Signed)
Spoke with Dawn at [address] regarding the patient after attempting to speak with the patient who was not quite understanding what I was talking about. Per Arrie Aran I will fax the pre-procedure instructions to her and she will speak with the patient's family to make sure they understand the information. Patient is scheduled for a thrombectomy with Dr. Delana Meyer on 05/08/20 with a 6:45 am arrival time to the MM. Patient does not need a covid test due to being positive at an earlier date.

## 2020-05-03 NOTE — Telephone Encounter (Signed)
Patient walked into our office wanting information regarding his procedure. Patient was handed information regarding his angiogram with Dr. Delana Meyer on 05/08/20.

## 2020-05-04 ENCOUNTER — Other Ambulatory Visit: Payer: Medicare HMO

## 2020-05-08 ENCOUNTER — Ambulatory Visit: Admission: RE | Admit: 2020-05-08 | Payer: Medicare HMO | Source: Home / Self Care | Admitting: Vascular Surgery

## 2020-05-08 ENCOUNTER — Other Ambulatory Visit (INDEPENDENT_AMBULATORY_CARE_PROVIDER_SITE_OTHER): Payer: Self-pay | Admitting: Nurse Practitioner

## 2020-05-08 ENCOUNTER — Encounter: Admission: RE | Payer: Self-pay | Source: Home / Self Care

## 2020-05-08 DIAGNOSIS — N186 End stage renal disease: Secondary | ICD-10-CM

## 2020-05-08 SURGERY — PERIPHERAL VASCULAR THROMBECTOMY
Anesthesia: Moderate Sedation | Laterality: Left

## 2020-05-12 ENCOUNTER — Emergency Department
Admission: EM | Admit: 2020-05-12 | Discharge: 2020-05-12 | Disposition: A | Discharge status: Home / Self Care | Payer: Medicare HMO | Attending: Emergency Medicine | Admitting: Emergency Medicine

## 2020-05-12 ENCOUNTER — Other Ambulatory Visit: Payer: Self-pay

## 2020-05-12 ENCOUNTER — Emergency Department: Payer: Medicare HMO

## 2020-05-12 DIAGNOSIS — Z79899 Other long term (current) drug therapy: Secondary | ICD-10-CM | POA: Insufficient documentation

## 2020-05-12 DIAGNOSIS — R079 Chest pain, unspecified: Secondary | ICD-10-CM | POA: Diagnosis present

## 2020-05-12 DIAGNOSIS — Z8616 Personal history of COVID-19: Secondary | ICD-10-CM | POA: Diagnosis not present

## 2020-05-12 DIAGNOSIS — R0789 Other chest pain: Secondary | ICD-10-CM | POA: Diagnosis not present

## 2020-05-12 DIAGNOSIS — E1122 Type 2 diabetes mellitus with diabetic chronic kidney disease: Secondary | ICD-10-CM | POA: Insufficient documentation

## 2020-05-12 DIAGNOSIS — I120 Hypertensive chronic kidney disease with stage 5 chronic kidney disease or end stage renal disease: Secondary | ICD-10-CM | POA: Insufficient documentation

## 2020-05-12 DIAGNOSIS — Z992 Dependence on renal dialysis: Secondary | ICD-10-CM | POA: Diagnosis not present

## 2020-05-12 DIAGNOSIS — E039 Hypothyroidism, unspecified: Secondary | ICD-10-CM | POA: Insufficient documentation

## 2020-05-12 DIAGNOSIS — N186 End stage renal disease: Secondary | ICD-10-CM | POA: Diagnosis not present

## 2020-05-12 LAB — CBC WITH DIFFERENTIAL/PLATELET
Abs Immature Granulocytes: 0.06 10*3/uL (ref 0.00–0.07)
Basophils Absolute: 0 10*3/uL (ref 0.0–0.1)
Basophils Relative: 0 %
Eosinophils Absolute: 0.1 10*3/uL (ref 0.0–0.5)
Eosinophils Relative: 1 %
HCT: 27.5 % — ABNORMAL LOW (ref 39.0–52.0)
Hemoglobin: 8.8 g/dL — ABNORMAL LOW (ref 13.0–17.0)
Immature Granulocytes: 1 %
Lymphocytes Relative: 20 %
Lymphs Abs: 1.4 10*3/uL (ref 0.7–4.0)
MCH: 33.7 pg (ref 26.0–34.0)
MCHC: 32 g/dL (ref 30.0–36.0)
MCV: 105.4 fL — ABNORMAL HIGH (ref 80.0–100.0)
Monocytes Absolute: 0.9 10*3/uL (ref 0.1–1.0)
Monocytes Relative: 13 %
Neutro Abs: 4.5 10*3/uL (ref 1.7–7.7)
Neutrophils Relative %: 65 %
Platelets: 136 10*3/uL — ABNORMAL LOW (ref 150–400)
RBC: 2.61 MIL/uL — ABNORMAL LOW (ref 4.22–5.81)
RDW: 16.2 % — ABNORMAL HIGH (ref 11.5–15.5)
WBC: 6.9 10*3/uL (ref 4.0–10.5)
nRBC: 0 % (ref 0.0–0.2)

## 2020-05-12 LAB — BASIC METABOLIC PANEL
Anion gap: 13 (ref 5–15)
BUN: 25 mg/dL — ABNORMAL HIGH (ref 8–23)
CO2: 29 mmol/L (ref 22–32)
Calcium: 8.6 mg/dL — ABNORMAL LOW (ref 8.9–10.3)
Chloride: 100 mmol/L (ref 98–111)
Creatinine, Ser: 5.65 mg/dL — ABNORMAL HIGH (ref 0.61–1.24)
GFR, Estimated: 9 mL/min — ABNORMAL LOW (ref >60)
Glucose, Bld: 110 mg/dL — ABNORMAL HIGH (ref 70–99)
Potassium: 4.3 mmol/L (ref 3.5–5.1)
Sodium: 142 mmol/L (ref 135–145)

## 2020-05-12 LAB — TROPONIN I (HIGH SENSITIVITY)
Troponin I (High Sensitivity): 29 ng/L — ABNORMAL HIGH (ref <18)
Troponin I (High Sensitivity): 26 ng/L — ABNORMAL HIGH (ref <18)

## 2020-05-12 NOTE — ED Triage Notes (Signed)
Pt BIB EMS for upper left chest pain, reproducable on palpation started at 0500, pt also sustained a fall last night that EMS responded to but the patient did not wish to be brought to the hospital then. Dialysis patient, has not missed any dialysis.

## 2020-05-12 NOTE — ED Notes (Signed)
Pt taken to lobby via wheelchair by this RN. Pt provided blue paper pants by this RN prior to D/C. Pt D/C into the care of his sister. Pt and sister denies comments/concerns regarding D/C instructions.

## 2020-05-12 NOTE — ED Notes (Signed)
Pt wife pulling toilet alarm. This RN to bedside. Wife stated he needed to urinate. Handed a urinal.

## 2020-05-12 NOTE — ED Notes (Signed)
Pt resting in bed with eyes closed, respirations even and unlabored at this time. Call bell remains within reach of patient.

## 2020-05-12 NOTE — ED Provider Notes (Signed)
[facility] Emergency Department Provider Note  ____________________________________________  Time seen: Approximately 7:48 AM  I have reviewed the triage vital signs and the nursing notes.   HISTORY  Chief Complaint Chest Pain    HPI Alejandro Armstrong is a 86 y.o. male with a history of end-stage renal disease on hemodialysis, hypertension and type 2 diabetes who comes ED complaining of chest pain.  He reports that he was in his usual state of health until 8:00 last night when he accidentally slid out of bed  and fell.  He got himself back in bed, but about 2 hours later at 10:00 PM he started having left chest pain.  No aggravating or alleviating factors, no shortness of breath diaphoresis or vomiting, nonradiating.  Hurts in the left lateral chest.  Feels achy.  Denies recent exertional symptoms.  Has been constant, waxing and waning since onset.  Patient has been compliant with dialysis and medications.  Past Medical History:  Diagnosis Date  . Arthritis   . Dialysis patient [facility])    Mon, Wed Fri  . Hyperlipidemia   . Hypertension   . Hypothyroidism   . Idiopathic thrombocytopenia purpura ([place])   . Kidney stones   . Recurrent UTI   . Renal agenesis    discovered at age 60  . Type 2 diabetes mellitus ([place])   . Ureteral stricture      Patient Active Problem List   Diagnosis Date Noted  . 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 Leukocytosis 04/24/2020  . Anemia in ESRD (end-stage renal disease) ([place]) 04/24/2020  . Acute hypoxemic respiratory failure ([place]) 04/18/2020  . Pneumonia due to COVID-19 virus   . Generalized weakness   . Recurrent UTI 02/16/2020  . Chest pain 11/04/2019  . Complication of vascular access for dialysis 11/20/2017  . Hyperlipidemia 03/26/2017  . ESRD (end stage renal disease) on dialysis ([place]) 03/26/2017  . Postop check 04/30/2016  . Fever chills 09/18/2015  . Bacteremia 09/18/2015  . Acute on chronic renal  insufficiency 09/01/2015  . Hyperkalemia 09/01/2015  . MRSA bacteremia secondary to PICC line infection 09/01/2015  . Type II diabetes mellitus with renal manifestations ([place]) 09/01/2015  . UTI (lower urinary tract infection) 08/06/2015  . Idiopathic thrombocytopenic purpura ([place]) 08/02/2015  . B12 deficiency 07/24/2015  . Pseudomonas infection 04/28/2014  . 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 Urinary urgency 02/21/2013  . Hypertrophy of prostate with urinary obstruction and other lower urinary tract symptoms (LUTS) 11/18/2012  . Calculus of kidney 03/12/2012  . Increased frequency of urination 03/12/2012  . Nocturia 03/12/2012  . 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 Urethral stricture 03/12/2012  . Urinary tract infection 03/12/2012  . Essential hypertension 03/31/2011  . Hypothyroidism 03/31/2011  . Chronic kidney disease, stage IV (severe) ([place]) 03/31/2011     Past Surgical History:  Procedure Laterality Date  . A/V FISTULAGRAM Left 03/31/2017   Procedure: A/V Fistulagram;  Surgeon: [name], MD;  Location: [place] CV LAB;  Service: Cardiovascular;  Laterality: Left;  . A/V FISTULAGRAM Left 09/07/2018   Procedure: A/V FISTULAGRAM;  Surgeon: [name], MD;  Location: [place] CV LAB;  Service: Cardiovascular;  Laterality: Left;  . A/V SHUNTOGRAM Left 07/28/2017   Procedure: A/V SHUNTOGRAM;  Surgeon: [name], MD;  Location: [place] CV LAB;  Service: Cardiovascular;  Laterality: Left;  . A/V SHUNTOGRAM Left 03/24/2018   Procedure: A/V SHUNTOGRAM;  Surgeon: [name], MD;  Location: [facility]  INVASIVE CV LAB;  Service: Cardiovascular;  Laterality: Left;  . APPENDECTOMY    . AV FISTULA PLACEMENT Left 04/16/2016   Procedure: INSERTION OF ARTERIOVENOUS (AV) GORE-TEX GRAFT ARM;  Surgeon: [name], MD;  Location: [facility] ORS;  Service: Vascular;  Laterality: Left;  . CATARACT EXTRACTION W/ INTRAOCULAR LENS IMPLANT  Bilateral 2014   done 1-2 months [facility] at [facility].  [name] FINGER SURGERY Left 2002   pinky finger  . KIDNEY STONE SURGERY    . KYPHOPLASTY [name] 09/16/2018   Procedure: KYPHOPLASTY L3, DIABETIC;  Surgeon: Hessie Knows, MD;  Location: [facility] ORS;  Service: Orthopedics;  Laterality: [name];  . NASAL SINUS SURGERY  1985  . PERIPHERAL VASCULAR CATHETERIZATION  05/13/2016   Procedure: Upper Extremity Angiography;  Surgeon: [name], MD;  Location: [place] CV LAB;  Service: Cardiovascular;;  . PERIPHERAL VASCULAR CATHETERIZATION  05/13/2016   Procedure: Upper Extremity Intervention;  Surgeon: [name], MD;  Location: [place] CV LAB;  Service: Cardiovascular;;  . PERIPHERAL VASCULAR THROMBECTOMY [name] 04/16/2020   Procedure: PERIPHERAL VASCULAR THROMBECTOMY, possible permcath placement;  Surgeon: Algernon Huxley, MD;  Location: [place] CV LAB;  Service: Cardiovascular;  Laterality: [name];  . PICC LINE PLACE PERIPHERAL ([place] [facility]) Right 08/2015  . PICC LINE REMOVAL ([place] [facility]) Right 09/2015     Prior to Admission medications   Medication Sig Start Date End Date Taking? Authorizing Provider  acetaminophen (TYLENOL) 500 MG tablet Take 1,000 mg by mouth every 6 (six) hours as needed for moderate pain or headache.     [provider]  albuterol (VENTOLIN HFA) 108 (90 Base) MCG/ACT inhaler Inhale 2 puffs into the lungs every 6 (six) hours. 04/19/20   Sharen Hones, MD  atorvastatin (LIPITOR) 10 MG tablet Take 10 mg by mouth daily.    [provider]  B Complex-C-Folic Acid (RENA-VITE RX) 1 MG TABS Take 1 tablet by mouth daily. 04/16/20   [provider]  calcium acetate (PHOSLO) 667 MG capsule Take 2 capsules (1,334 mg total) by mouth 3 (three) times daily with meals. 04/19/20   Sharen Hones, MD  Carboxymethylcellulose Sodium (THERATEARS) 0.25 % SOLN Place 1-2 drops into both eyes 3 (three) times daily as needed (for dry eyes).    [provider]   Cholecalciferol (VITAMIN D3) 1.25 MG (50000 UT) CAPS Take 50,000 Units by mouth every Wednesday.     [provider]  furosemide (LASIX) 40 MG tablet Take 40 mg by mouth 2 (two) times daily.     [provider]  hydrALAZINE (APRESOLINE) 25 MG tablet Take 25 mg by mouth 2 (two) times daily.    [provider]  levothyroxine (SYNTHROID) 125 MCG tablet Take 125 mcg by mouth daily before breakfast.     [provider]  lidocaine-prilocaine (EMLA) cream Apply 1 application topically daily as needed (port access).  10/03/16   [provider]  lisinopril (PRINIVIL,ZESTRIL) 20 MG tablet Take 20 mg by mouth daily.  06/24/12   [provider]  metoprolol tartrate (LOPRESSOR) 25 MG tablet Take 0.5 tablets (12.5 mg total) by mouth 2 (two) times daily. 04/19/20   Sharen Hones, MD  Omega-3 1000 MG CAPS Take 2,000 mg by mouth 2 (two) times daily.     [provider]     Allergies Patient has no known allergies.   Family History  Problem Relation Age of Onset  . Cervical cancer Sister   . CAD Father     Social  History Social History   Tobacco Use  . Smoking status: Never Smoker  . Smokeless tobacco: Never Used  Vaping Use  . Vaping Use: Never used  Substance Use Topics  . Alcohol use: No    Alcohol/week: 0.0 standard drinks  . Drug use: No    Review of Systems  Constitutional:   No fever or chills.  ENT:   No sore throat. No rhinorrhea. Cardiovascular: Positive chest pain as above without syncope. Respiratory:   No dyspnea or cough. Gastrointestinal:   Negative for abdominal pain, vomiting and diarrhea.  Musculoskeletal:   Negative for focal pain or swelling All other systems reviewed and are negative except as documented above in ROS and HPI.  ____________________________________________   PHYSICAL EXAM:  VITAL SIGNS: ED Triage Vitals  Enc Vitals Group     BP 05/12/20 0709 (!) 175/105     Pulse Rate 05/12/20 0709 (!)  56     Resp 05/12/20 0709 18     Temp 05/12/20 0710 98.9 F (37.2 C)     Temp Source 05/12/20 0710 Oral     SpO2 05/12/20 0709 94 %     Weight 05/12/20 0714 165 lb (74.8 kg)     Height 05/12/20 0714 5\' 7"  (1.702 m)     Head Circumference --      Peak Flow --      Pain Score --      Pain Loc --      Pain Edu? --      Excl. in [place]? --     Vital signs reviewed, nursing assessments reviewed.   Constitutional:   Alert and oriented. Non-toxic appearance. Eyes:   Conjunctivae are normal. EOMI. PERRL. ENT      Head:   Normocephalic and atraumatic.      Nose:   Wearing a mask.      Mouth/Throat:   Wearing a mask.      Neck:   No meningismus. Full ROM. Hematological/Lymphatic/Immunilogical:   No cervical lymphadenopathy. Cardiovascular:   Bradycardia heart rate 55. Symmetric bilateral radial and DP pulses.  No murmurs. Cap refill less than 2 seconds. Respiratory:   Normal respiratory effort without tachypnea/retractions. Breath sounds are clear and equal bilaterally. No wheezes/rales/rhonchi. Gastrointestinal:   Soft and nontender. Non distended. There is no CVA tenderness.  No rebound, rigidity, or guarding. Musculoskeletal:   Normal range of motion in all extremities. No joint effusions.  No lower extremity tenderness.  No edema. Left lateral chest wall tender to the touch over sixth and seventh rib, reproducing his pain Neurologic:   Normal speech and language.  Motor grossly intact. No acute focal neurologic deficits are appreciated.  Skin:    Skin is warm, dry and intact. No rash noted.  No petechiae, purpura, or bullae.  ____________________________________________    LABS (pertinent positives/negatives) (all labs ordered are listed, but only abnormal results are displayed) Labs Reviewed  BASIC METABOLIC PANEL - Abnormal; Notable for the following components:      Result Value   Glucose, Bld 110 (*)    BUN 25 (*)    Creatinine, Ser 5.65 (*)    Calcium 8.6 (*)    GFR,  Estimated 9 (*)    All other components within normal limits  CBC WITH DIFFERENTIAL/PLATELET - Abnormal; Notable for the following components:   RBC 2.61 (*)    Hemoglobin 8.8 (*)    HCT 27.5 (*)    MCV 105.4 (*)    RDW 16.2 (*)  Platelets 136 (*)    All other components within normal limits  TROPONIN I (HIGH SENSITIVITY) - Abnormal; Notable for the following components:   Troponin I (High Sensitivity) 29 (*)    All other components within normal limits  TROPONIN I (HIGH SENSITIVITY) - Abnormal; Notable for the following components:   Troponin I (High Sensitivity) 26 (*)    All other components within normal limits   ____________________________________________   EKG  Interpreted by me Sinus rhythm rate of 57, normal axis and intervals.  Voltage criteria for LVH in the high lateral leads.  Normal ST segments and T waves.  No acute ischemic changes.  ____________________________________________    [id number]  DG Chest Portable 1 View  Result Date: 05/12/2020 CLINICAL DATA:  Chest pain following fall EXAM: PORTABLE CHEST 1 VIEW COMPARISON:  April 26, 2020 FINDINGS: Central catheter tip is at the cavoatrial junction. No pneumothorax. There is mild bibasilar atelectasis. Lungs elsewhere clear. Heart is upper normal in size with pulmonary vascularity normal. No adenopathy. There are stents in the left innominate, left axillary, and left brachial regions. No bone lesions. IMPRESSION: Mild bibasilar atelectasis. No edema or airspace opacity. Stable cardiac silhouette. Central catheter tip at cavoatrial junction without pneumothorax. Electronically Signed   By: Lowella Grip III M.D.   On: 05/12/2020 07:54    ____________________________________________   PROCEDURES Procedures  ____________________________________________  DIFFERENTIAL DIAGNOSIS   Chest wall contusion, pneumothorax, pneumonia, non-STEMI  CLINICAL IMPRESSION / ASSESSMENT AND PLAN / ED  COURSE  Medications ordered in the ED: Medications - No data to display  Pertinent labs & imaging results that were available during my care of the patient were reviewed by me and considered in my medical decision making (see chart for details).  Alejandro Armstrong was evaluated in Emergency Department on 05/12/2020 for the symptoms described in the history of present illness. He was evaluated in the context of the global COVID-19 pandemic, which necessitated consideration that the patient might be at risk for infection with the SARS-CoV-2 virus that causes COVID-19. Institutional protocols and algorithms that pertain to the evaluation of patients at risk for COVID-19 are in a state of rapid change based on information released by regulatory bodies including the CDC and federal and state organizations. These policies and algorithms were followed during the patient's care in the ED.   Patient presents with reproducible left chest wall pain after a minor injury at home.  Highly likely to be musculoskeletal chest wall pain, but with age and comorbidities, will obtain chest x-ray and labs to further evaluate.  I believe a single troponin is sufficient in this case given onset of symptoms 8+ hours ago and low pretest probability.  Clinical Course as of May 12 1026  Sat May 12, 2020  0821 Labs show baseline anemia, chronic ESRD.  Troponin of 29 slightly higher than previous baseline, will trend..   [PS]  1026 Repeat troponin stable.  Suitable for outpatient follow-up.  Sister / HCPOA at bedside who agrees and will transport him home.   [PS]    Clinical Course User Index [PS] Carrie Mew, MD     ____________________________________________   FINAL CLINICAL IMPRESSION(S) / ED DIAGNOSES    Final diagnoses:  Left-sided chest wall pain  ESRD on hemodialysis [facility])     ED Discharge Orders    None      Portions of this note were generated with dragon dictation software. Dictation  errors may occur despite best attempts at proofreading.  Carrie Mew, MD 05/12/20 1027

## 2020-05-12 NOTE — ED Notes (Signed)
Pt visualized resting in bed with eyes closed, respirations even and unlabored at this time. Call bell remains within reach of patient at this time.

## 2020-05-12 NOTE — ED Notes (Signed)
This RN to bedside, pt's sister at bedside. Pt's sister updated. Pt provided water with MD permission. Pt denies further needs.

## 2020-05-22 ENCOUNTER — Other Ambulatory Visit: Payer: Self-pay

## 2020-05-22 ENCOUNTER — Emergency Department: Payer: Medicare HMO

## 2020-05-22 ENCOUNTER — Observation Stay
Admission: EM | Admit: 2020-05-22 | Discharge: 2020-05-24 | Disposition: A | Discharge status: Home / Self Care | Payer: Medicare HMO | Attending: Internal Medicine | Admitting: Internal Medicine

## 2020-05-22 ENCOUNTER — Encounter: Payer: Self-pay | Admitting: Intensive Care

## 2020-05-22 DIAGNOSIS — E1122 Type 2 diabetes mellitus with diabetic chronic kidney disease: Secondary | ICD-10-CM | POA: Insufficient documentation

## 2020-05-22 DIAGNOSIS — E8779 Other fluid overload: Secondary | ICD-10-CM

## 2020-05-22 DIAGNOSIS — Z9181 History of falling: Secondary | ICD-10-CM

## 2020-05-22 DIAGNOSIS — Z20822 Contact with and (suspected) exposure to covid-19: Secondary | ICD-10-CM | POA: Diagnosis not present

## 2020-05-22 DIAGNOSIS — N3000 Acute cystitis without hematuria: Secondary | ICD-10-CM | POA: Diagnosis not present

## 2020-05-22 DIAGNOSIS — Z79899 Other long term (current) drug therapy: Secondary | ICD-10-CM | POA: Diagnosis not present

## 2020-05-22 DIAGNOSIS — R531 Weakness: Secondary | ICD-10-CM | POA: Diagnosis present

## 2020-05-22 DIAGNOSIS — N186 End stage renal disease: Secondary | ICD-10-CM | POA: Insufficient documentation

## 2020-05-22 DIAGNOSIS — Z8616 Personal history of COVID-19: Secondary | ICD-10-CM | POA: Insufficient documentation

## 2020-05-22 DIAGNOSIS — E039 Hypothyroidism, unspecified: Secondary | ICD-10-CM | POA: Diagnosis not present

## 2020-05-22 DIAGNOSIS — Z992 Dependence on renal dialysis: Secondary | ICD-10-CM | POA: Insufficient documentation

## 2020-05-22 DIAGNOSIS — Z7984 Long term (current) use of oral hypoglycemic drugs: Secondary | ICD-10-CM | POA: Insufficient documentation

## 2020-05-22 DIAGNOSIS — E8770 Fluid overload, unspecified: Secondary | ICD-10-CM | POA: Diagnosis present

## 2020-05-22 DIAGNOSIS — I10 Essential (primary) hypertension: Secondary | ICD-10-CM

## 2020-05-22 DIAGNOSIS — R5381 Other malaise: Secondary | ICD-10-CM | POA: Diagnosis not present

## 2020-05-22 LAB — CBC WITH DIFFERENTIAL/PLATELET
Abs Immature Granulocytes: 0.14 10*3/uL — ABNORMAL HIGH (ref 0.00–0.07)
Basophils Absolute: 0.1 10*3/uL (ref 0.0–0.1)
Basophils Relative: 1 %
Eosinophils Absolute: 0.2 10*3/uL (ref 0.0–0.5)
Eosinophils Relative: 3 %
HCT: 28.5 % — ABNORMAL LOW (ref 39.0–52.0)
Hemoglobin: 9.3 g/dL — ABNORMAL LOW (ref 13.0–17.0)
Immature Granulocytes: 2 %
Lymphocytes Relative: 19 %
Lymphs Abs: 1.6 10*3/uL (ref 0.7–4.0)
MCH: 33.3 pg (ref 26.0–34.0)
MCHC: 32.6 g/dL (ref 30.0–36.0)
MCV: 102.2 fL — ABNORMAL HIGH (ref 80.0–100.0)
Monocytes Absolute: 0.5 10*3/uL (ref 0.1–1.0)
Monocytes Relative: 6 %
Neutro Abs: 6.2 10*3/uL (ref 1.7–7.7)
Neutrophils Relative %: 69 %
Platelets: 178 10*3/uL (ref 150–400)
RBC: 2.79 MIL/uL — ABNORMAL LOW (ref 4.22–5.81)
RDW: 16.1 % — ABNORMAL HIGH (ref 11.5–15.5)
WBC: 8.7 10*3/uL (ref 4.0–10.5)
nRBC: 0 % (ref 0.0–0.2)

## 2020-05-22 LAB — URINALYSIS, COMPLETE (UACMP) WITH MICROSCOPIC
Bilirubin Urine: NEGATIVE
Glucose, UA: 50 mg/dL — AB
Hgb urine dipstick: NEGATIVE
Ketones, ur: NEGATIVE mg/dL
Nitrite: NEGATIVE
Protein, ur: 100 mg/dL — AB
Specific Gravity, Urine: 1.009 (ref 1.005–1.030)
WBC, UA: >50 WBC/hpf — ABNORMAL HIGH (ref 0–5)
pH: 8 (ref 5.0–8.0)

## 2020-05-22 LAB — COMPREHENSIVE METABOLIC PANEL
ALT: 16 U/L (ref 0–44)
AST: 24 U/L (ref 15–41)
Albumin: 3.4 g/dL — ABNORMAL LOW (ref 3.5–5.0)
Alkaline Phosphatase: 104 U/L (ref 38–126)
Anion gap: 15 (ref 5–15)
BUN: 34 mg/dL — ABNORMAL HIGH (ref 8–23)
CO2: 22 mmol/L (ref 22–32)
Calcium: 10.2 mg/dL (ref 8.9–10.3)
Chloride: 101 mmol/L (ref 98–111)
Creatinine, Ser: 7.58 mg/dL — ABNORMAL HIGH (ref 0.61–1.24)
GFR, Estimated: 6 mL/min — ABNORMAL LOW (ref >60)
Glucose, Bld: 98 mg/dL (ref 70–99)
Potassium: 4.4 mmol/L (ref 3.5–5.1)
Sodium: 138 mmol/L (ref 135–145)
Total Bilirubin: 0.9 mg/dL (ref 0.3–1.2)
Total Protein: 7.3 g/dL (ref 6.5–8.1)

## 2020-05-22 LAB — RESP PANEL BY RT-PCR (FLU A&B, COVID) ARPGX2
Influenza A by PCR: NEGATIVE
Influenza B by PCR: NEGATIVE
SARS Coronavirus 2 by RT PCR: NEGATIVE

## 2020-05-22 LAB — LIPASE, BLOOD: Lipase: 29 U/L (ref 11–51)

## 2020-05-22 MED ORDER — HYDRALAZINE HCL 20 MG/ML IJ SOLN: 10.0000 mg | INTRAMUSCULAR | Status: AC

## 2020-05-22 MED ORDER — ONDANSETRON HCL 4 MG/2ML IJ SOLN
4.0000 mg | Freq: Four times a day (QID) | INTRAMUSCULAR | Status: DC | PRN
  Administered 2020-05-22 – 2020-05-24 (×2): 4 mg via INTRAVENOUS
  Filled 2020-05-22 (×2): qty 2

## 2020-05-22 MED ORDER — OMEGA-3-ACID ETHYL ESTERS 1 G PO CAPS
2000.0000 mg | ORAL_CAPSULE | Freq: Two times a day (BID) | ORAL | Status: DC
  Administered 2020-05-23 – 2020-05-24 (×3): 2000 mg via ORAL
  Filled 2020-05-22 (×5): qty 2

## 2020-05-22 MED ORDER — ATORVASTATIN CALCIUM 10 MG PO TABS
10.0000 mg | ORAL_TABLET | Freq: Every day | ORAL | Status: DC
  Administered 2020-05-23: 10 mg via ORAL
  Filled 2020-05-22 (×2): qty 1

## 2020-05-22 MED ORDER — HYDRALAZINE HCL 25 MG PO TABS
25.0000 mg | ORAL_TABLET | Freq: Two times a day (BID) | ORAL | Status: DC
  Administered 2020-05-23 – 2020-05-24 (×3): 25 mg via ORAL
  Filled 2020-05-22 (×3): qty 1

## 2020-05-22 MED ORDER — LISINOPRIL 20 MG PO TABS
20.0000 mg | ORAL_TABLET | Freq: Every day | ORAL | Status: DC
  Administered 2020-05-22 – 2020-05-24 (×3): 20 mg via ORAL
  Filled 2020-05-22 (×2): qty 2
  Filled 2020-05-22: qty 1

## 2020-05-22 MED ORDER — HEPARIN SODIUM (PORCINE) 5000 UNIT/ML IJ SOLN
5000.0000 [IU] | Freq: Three times a day (TID) | INTRAMUSCULAR | Status: DC
  Administered 2020-05-22 – 2020-05-24 (×5): 5000 [IU] via SUBCUTANEOUS
  Filled 2020-05-22 (×5): qty 1

## 2020-05-22 MED ORDER — SODIUM CHLORIDE 0.9 % IV SOLN
1.0000 g | Freq: Once | INTRAVENOUS | Status: AC
  Administered 2020-05-22: 1 g via INTRAVENOUS
  Filled 2020-05-22: qty 10

## 2020-05-22 MED ORDER — HYDRALAZINE HCL 25 MG PO TABS: 25.0000 mg | ORAL_TABLET | Freq: Once | ORAL | Status: DC

## 2020-05-22 MED ORDER — OXYCODONE HCL 5 MG PO TABS
5.0000 mg | ORAL_TABLET | ORAL | Status: DC | PRN
  Administered 2020-05-22: 5 mg via ORAL
  Filled 2020-05-22: qty 1

## 2020-05-22 MED ORDER — SODIUM CHLORIDE 0.9 % IV SOLN
1.0000 g | INTRAVENOUS | Status: DC
  Administered 2020-05-23: 1 g via INTRAVENOUS
  Filled 2020-05-22 (×2): qty 10

## 2020-05-22 MED ORDER — ONDANSETRON HCL 4 MG PO TABS: 4.0000 mg | ORAL_TABLET | Freq: Four times a day (QID) | ORAL | Status: DC | PRN

## 2020-05-22 MED ORDER — CALCIUM ACETATE (PHOS BINDER) 667 MG PO CAPS
1334.0000 mg | ORAL_CAPSULE | Freq: Three times a day (TID) | ORAL | Status: DC
  Administered 2020-05-22 – 2020-05-24 (×5): 1334 mg via ORAL
  Filled 2020-05-22 (×6): qty 2

## 2020-05-22 MED ORDER — METOPROLOL TARTRATE 25 MG PO TABS
12.5000 mg | ORAL_TABLET | Freq: Two times a day (BID) | ORAL | Status: DC
  Administered 2020-05-24: 12.5 mg via ORAL
  Filled 2020-05-22 (×3): qty 1

## 2020-05-22 MED ORDER — ACETAMINOPHEN 650 MG RE SUPP: 325.0000 mg | Freq: Four times a day (QID) | RECTAL | Status: DC | PRN

## 2020-05-22 MED ORDER — LEVOTHYROXINE SODIUM 50 MCG PO TABS
125.0000 ug | ORAL_TABLET | Freq: Every day | ORAL | Status: DC
  Administered 2020-05-23 – 2020-05-24 (×2): 125 ug via ORAL
  Filled 2020-05-22 (×2): qty 1

## 2020-05-22 MED ORDER — VITAMIN D (ERGOCALCIFEROL) 1.25 MG (50000 UNIT) PO CAPS
50000.0000 [IU] | ORAL_CAPSULE | ORAL | Status: DC
  Administered 2020-05-23: 50000 [IU] via ORAL
  Filled 2020-05-22: qty 1

## 2020-05-22 MED ORDER — ACETAMINOPHEN 325 MG PO TABS
325.0000 mg | ORAL_TABLET | Freq: Four times a day (QID) | ORAL | Status: DC | PRN
  Administered 2020-05-22 – 2020-05-23 (×2): 325 mg via ORAL
  Filled 2020-05-22: qty 1

## 2020-05-22 MED ORDER — RENA-VITE PO TABS
1.0000 | ORAL_TABLET | Freq: Every day | ORAL | Status: DC
  Administered 2020-05-22 – 2020-05-24 (×3): 1 via ORAL
  Filled 2020-05-22 (×3): qty 1

## 2020-05-22 MED ORDER — FUROSEMIDE 10 MG/ML IJ SOLN
60.0000 mg | Freq: Once | INTRAMUSCULAR | Status: AC
  Administered 2020-05-22: 60 mg via INTRAVENOUS
  Filled 2020-05-22: qty 8

## 2020-05-22 NOTE — ED Notes (Signed)
Pt soaked in urine.  Pt changed and cleaned without any diff   purewick in place.  Pt alert.

## 2020-05-22 NOTE — ED Notes (Signed)
Pt alert.  Pt waiting on dialysis transport
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
## 2020-05-22 NOTE — Progress Notes (Signed)
Patient told the HD nurse to stop bothering his sleep when writer attempted to care for the patient.

## 2020-05-22 NOTE — Progress Notes (Signed)
Noted improvement with blood pressure at this time. Will continue to monitor

## 2020-05-22 NOTE — ED Notes (Signed)
Pt alert  Resting quietly   Pt waiting on admission bed.

## 2020-05-22 NOTE — ED Notes (Signed)
Pt to dialysis via transport

## 2020-05-22 NOTE — ED Triage Notes (Signed)
Patient presents with emesis and weakness. Dialysis patient M,W,F. Missed dialysis yesterday due to not feeling well.

## 2020-05-22 NOTE — Progress Notes (Signed)
Patient with c/o having bilateral LE pain rated 6 out of 10. Tylenol 325mg  PO x 1 tablet administered per standing orders. Will continue to monitor.

## 2020-05-22 NOTE — Patient Care Conference (Incomplete)
Took over care at 2200. PT resting on strecher. 1 hour left of Treatment  2230 PT BP droppped and stated he wasn't feeling well. Pt given 258ml NS bolus and UF turned off at this time. PT agitated about  Being in the hospital and wants to come off treatment.  Encouraged to stay on for last 30 mins. . ED Nurse  notified

## 2020-05-23 DIAGNOSIS — E8779 Other fluid overload: Secondary | ICD-10-CM | POA: Diagnosis not present

## 2020-05-23 LAB — BASIC METABOLIC PANEL
Anion gap: 11 (ref 5–15)
BUN: 18 mg/dL (ref 8–23)
CO2: 26 mmol/L (ref 22–32)
Calcium: 9.8 mg/dL (ref 8.9–10.3)
Chloride: 98 mmol/L (ref 98–111)
Creatinine, Ser: 4.93 mg/dL — ABNORMAL HIGH (ref 0.61–1.24)
GFR, Estimated: 11 mL/min — ABNORMAL LOW (ref >60)
Glucose, Bld: 75 mg/dL (ref 70–99)
Potassium: 4.4 mmol/L (ref 3.5–5.1)
Sodium: 135 mmol/L (ref 135–145)

## 2020-05-23 LAB — CBC
HCT: 29.2 % — ABNORMAL LOW (ref 39.0–52.0)
Hemoglobin: 9.5 g/dL — ABNORMAL LOW (ref 13.0–17.0)
MCH: 33.2 pg (ref 26.0–34.0)
MCHC: 32.5 g/dL (ref 30.0–36.0)
MCV: 102.1 fL — ABNORMAL HIGH (ref 80.0–100.0)
Platelets: 163 10*3/uL (ref 150–400)
RBC: 2.86 MIL/uL — ABNORMAL LOW (ref 4.22–5.81)
RDW: 16 % — ABNORMAL HIGH (ref 11.5–15.5)
WBC: 9.6 10*3/uL (ref 4.0–10.5)
nRBC: 0 % (ref 0.0–0.2)

## 2020-05-23 MED ORDER — CHLORHEXIDINE GLUCONATE CLOTH 2 % EX PADS
6.0000 | MEDICATED_PAD | Freq: Every day | CUTANEOUS | Status: DC
  Administered 2020-05-23 – 2020-05-24 (×2): 6 via TOPICAL

## 2020-05-23 MED ORDER — SODIUM CHLORIDE 0.9 % IV SOLN
1.0000 g | INTRAVENOUS | Status: DC
  Administered 2020-05-23: 1 g via INTRAVENOUS
  Filled 2020-05-23 (×3): qty 1

## 2020-05-23 NOTE — NC FL2 (Addendum)
[place] LEVEL OF CARE SCREENING TOOL     IDENTIFICATION  Patient Name: Alejandro Armstrong Birthdate: 1930-03-18 Sex: male Admission Date (Current Location): 05/22/2020  [place] and [place] Number:  Engineering geologist and Address:  [facility], [address] [place], [place] [address]      Provider Number: [id number]  Attending Physician Name and Address:  Kayleen Memos, DO  Relative Name and Phone Number:       Current Level of Care: Hospital Recommended Level of Care: [place] Prior Approval Number:    Date Approved/Denied:   PASRR Number: [id number]  Discharge Plan: SNF    Current Diagnoses: Patient Active Problem List   Diagnosis Date Noted  . Volume overload 05/22/2020  . AV fistula thrombosis ([place]) 04/24/2020  . Leukocytosis 04/24/2020  . Anemia in ESRD (end-stage renal disease) ([place]) 04/24/2020  . Acute hypoxemic respiratory failure ([place]) 04/18/2020  . Pneumonia due to COVID-19 virus   . Generalized weakness   . Recurrent UTI 02/16/2020  . Chest pain 11/04/2019  . Complication of vascular access for dialysis 11/20/2017  . Hyperlipidemia 03/26/2017  . ESRD (end stage renal disease) on dialysis ([place]) 03/26/2017  . Postop check 04/30/2016  . Fever chills 09/18/2015  . Bacteremia 09/18/2015  . Acute on chronic renal insufficiency 09/01/2015  . Hyperkalemia 09/01/2015  . MRSA bacteremia secondary to PICC line infection 09/01/2015  . Type II diabetes mellitus with renal manifestations ([place]) 09/01/2015  . UTI (lower urinary tract infection) 08/06/2015  . Idiopathic thrombocytopenic purpura ([place]) 08/02/2015  . B12 deficiency 07/24/2015  . Pseudomonas infection 04/28/2014  . 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 Urinary urgency 02/21/2013  . Hypertrophy of prostate with urinary obstruction and other lower urinary tract symptoms (LUTS) 11/18/2012  . Calculus of kidney 03/12/2012  . Increased  frequency of urination 03/12/2012  . Nocturia 03/12/2012  . 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 Urethral stricture 03/12/2012  . Urinary tract infection 03/12/2012  . Essential hypertension 03/31/2011  . Hypothyroidism 03/31/2011  . Chronic kidney disease, stage IV (severe) ([place]) 03/31/2011    Orientation RESPIRATION BLADDER Height & Weight     Self, Situation  Normal Incontinent Weight: 160 lb (72.6 kg) Height:  5\' 7"  (170.2 cm)  BEHAVIORAL SYMPTOMS/MOOD NEUROLOGICAL BOWEL NUTRITION STATUS  Other (Comment) (Irritable)  (None) Continent Diet (Renal with fluid restriction: 1200 mL.)  AMBULATORY STATUS COMMUNICATION OF NEEDS Skin   Extensive Assist Verbally Normal                       Personal Care Assistance Level of Assistance  Bathing, Feeding, Dressing Bathing Assistance: Limited assistance Feeding assistance: Limited assistance Dressing Assistance: Limited assistance     Functional Limitations Info  Sight, Hearing, Speech Sight Info: Adequate  Hearing Info: Impaired Speech Info: Adequate    SPECIAL CARE FACTORS FREQUENCY  PT (By licensed PT), OT (By licensed OT)     PT Frequency: 5 x week OT Frequency: 5 x week            Contractures Contractures Info: Not present    Additional Factors Info  Code Status, Allergies Code Status Info: DNR Allergies Info: NKDA           Current Medications (05/23/2020):  This is the current hospital active medication list Current Facility-Administered Medications  Medication Dose Route Frequency Provider Last Rate Last Admin  . acetaminophen (TYLENOL) tablet 325 mg  325 mg Oral  Q6H PRN Cox, Amy N, DO   325 mg at 05/22/20 2117   Or  . acetaminophen (TYLENOL) suppository 325 mg  325 mg Rectal Q6H PRN Cox, Amy N, DO      . atorvastatin (LIPITOR) tablet 10 mg  10 mg Oral q1800 Cox, Amy N, DO      . calcium acetate (PHOSLO) capsule 1,334 mg  1,334 mg Oral TID WC Cox, Amy N, DO   1,334 mg at 05/23/20 1315  .  cefTRIAXone (ROCEPHIN) 1 g in sodium chloride 0.9 % 100 mL IVPB  1 g Intravenous Q24H Cox, Amy N, DO   Stopped at 05/23/20 1315  . Chlorhexidine Gluconate Cloth 2 % PADS 6 each  6 each Topical Daily Irene Pap N, DO      . heparin injection 5,000 Units  5,000 Units Subcutaneous Q8H Cox, Amy N, DO   5,000 Units at 05/23/20 1441  . hydrALAZINE (APRESOLINE) injection 10 mg  10 mg Intravenous STAT Sharion Settler, NP      . hydrALAZINE (APRESOLINE) tablet 25 mg  25 mg Oral BID Cox, Amy N, DO   25 mg at 05/23/20 1047  . hydrALAZINE (APRESOLINE) tablet 25 mg  25 mg Oral Once Cox, Amy N, DO      . levothyroxine (SYNTHROID) tablet 125 mcg  125 mcg Oral Q0600 Cox, Amy N, DO   125 mcg at 05/23/20 0616  . lisinopril (ZESTRIL) tablet 20 mg  20 mg Oral Daily Cox, Amy N, DO   20 mg at 05/23/20 1048  . metoprolol tartrate (LOPRESSOR) tablet 12.5 mg  12.5 mg Oral BID Cox, Amy N, DO      . multivitamin (RENA-VIT) tablet 1 tablet  1 tablet Oral Daily Cox, Amy N, DO   1 tablet at 05/23/20 1048  . omega-3 acid ethyl esters (LOVAZA) capsule 2,000 mg  2,000 mg Oral BID Cox, Amy N, DO   2,000 mg at 05/23/20 1044  . ondansetron (ZOFRAN) tablet 4 mg  4 mg Oral Q6H PRN Cox, Amy N, DO       Or  . ondansetron (ZOFRAN) injection 4 mg  4 mg Intravenous Q6H PRN Cox, Amy N, DO   4 mg at 05/22/20 1314  . Vitamin D (Ergocalciferol) (DRISDOL) capsule 50,000 Units  50,000 Units Oral Q Wed Cox, Amy N, DO   50,000 Units at 05/23/20 1044   Facility-Administered Medications Ordered in Other Encounters  Medication Dose Route Frequency Provider Last Rate Last Admin  . heparin lock flush 100 unit/mL  500 Units Intravenous Once [name], MD      . sodium chloride flush (NS) 0.9 % injection 10 mL  10 mL Intravenous PRN [name], MD      . sodium chloride flush (NS) 0.9 % injection 10 mL  10 mL Intravenous PRN [name], MD   10 mL at 08/16/15 1610     Discharge Medications: Please see discharge  summary for a list of discharge medications.  Relevant Imaging Results:  Relevant Lab Results:   Additional Information SS#: [id number]. HD MWF at [facility] at 11:00 or 11:15. Has not started there yet. Was supposed to start this week. Was MWF at [facility] on [address] at 9:50 am before the transfer.  Candie Chroman, LCSW

## 2020-05-23 NOTE — Care Management Obs Status (Signed)
[place] NOTIFICATION   Patient Details  Name: MATEJ SAPPENFIELD MRN: [id number] Date of Birth: 11-Aug-1929   Medicare Observation Status Notification Given:  Yes (Put on chart for wife when she visits.)    Candie Chroman, LCSW 05/23/2020, 3:18 PM

## 2020-05-23 NOTE — ED Notes (Signed)
ED TO INPATIENT HANDOFF REPORT  ED Nurse Name and Phone #: Olen Cordial [phone]  S Name/Age/Gender Alejandro Armstrong 86 y.o. male Room/Bed: ED37A/ED37A  Code Status   Code Status: DNR  Home/SNF/Other Skilled nursing facility Patient oriented to: self and place Is this baseline? Yes   Triage Complete: Triage complete  Chief Complaint ESRD on dialysis ([place]) [N18.6, Z99.2] Volume overload [E87.70] Acute cystitis without hematuria [N30.00] Generalized weakness [R53.1]  Triage Note Patient presents with emesis and weakness. Dialysis patient M,W,F. Missed dialysis yesterday due to not feeling well.     Allergies No Known Allergies  Level of Care/Admitting Diagnosis ED Disposition    ED Disposition Condition [facility] Area: [place] [100120]  Level of Care: Med-Surg [16]  Covid Evaluation: Asymptomatic Screening Protocol (No Symptoms)  Diagnosis: Volume overload [196222]  Admitting Physician: Criss Alvine [[id number]]  Attending Physician: COX, AMY N [id number]       B Medical/Surgery History Past Medical History:  Diagnosis Date  . Arthritis   . Dialysis patient [facility])    Mon, Wed Fri  . Hyperlipidemia   . Hypertension   . Hypothyroidism   . Idiopathic thrombocytopenia purpura ([place])   . Kidney stones   . Recurrent UTI   . Renal agenesis    discovered at age 35  . Type 2 diabetes mellitus ([place])   . Ureteral stricture    Past Surgical History:  Procedure Laterality Date  . A/V FISTULAGRAM Left 03/31/2017   Procedure: A/V Fistulagram;  Surgeon: [name], MD;  Location: [place] [facility];  Service: Cardiovascular;  Laterality: Left;  . A/V FISTULAGRAM Left 09/07/2018   Procedure: A/V FISTULAGRAM;  Surgeon: [name], MD;  Location: [place] [facility];  Service: Cardiovascular;  Laterality: Left;  . A/V SHUNTOGRAM Left 07/28/2017   Procedure: A/V SHUNTOGRAM;  Surgeon: [name], MD;  Location: [place] [facility];  Service: Cardiovascular;  Laterality: Left;  . A/V SHUNTOGRAM Left 03/24/2018   Procedure: A/V SHUNTOGRAM;  Surgeon: [name], MD;  Location: [place] [facility];  Service: Cardiovascular;  Laterality: Left;  . APPENDECTOMY    . AV FISTULA PLACEMENT Left 04/16/2016   Procedure: INSERTION OF ARTERIOVENOUS (AV) GORE-TEX GRAFT ARM;  Surgeon: [name], MD;  Location: [facility] ORS;  Service: Vascular;  Laterality: Left;  . CATARACT EXTRACTION W/ INTRAOCULAR LENS IMPLANT Bilateral 2014   done 1-2 months apart at [facility].  [name] FINGER SURGERY Left 2002   pinky finger  . KIDNEY STONE SURGERY    . KYPHOPLASTY [name] 09/16/2018   Procedure: KYPHOPLASTY L3, DIABETIC;  Surgeon: Hessie Knows, MD;  Location: [facility] OR[facility];  Service: Orthopedics;  Laterality: [name];  . NASAL SINUS SURGERY  1985  . PERIPHERAL VASCULAR CATHETERIZATION  05/13/2016   Procedure: Upper Extremity Angiography;  Surgeon: [name], MD;  Location: [place] [facility];  Service: Cardiovascular;;  . PERIPHERAL VASCULAR CATHETERIZATION  05/13/2016   Procedure: Upper Extremity Intervention;  Surgeon: [name], MD;  Location: [place] [facility];  Service: Cardiovascular;;  . PERIPHERAL VASCULAR THROMBECTOMY [name] 04/16/2020   Procedure: PERIPHERAL VASCULAR THROMBECTOMY, possible permcath placement;  Surgeon: Algernon Huxley, MD;  Location: [place] [facility];  Service: Cardiovascular;  Laterality: [name];  . PICC LINE PLACE PERIPHERAL ([employer] HX) Right 08/2015  . PICC LINE REMOVAL ([place] HX) Right 09/2015     A IV Location/Drains/Wounds Patient Lines/Drains/Airways Status    Active Line/Drains/Airways  Name Placement date Placement time Site Days   Peripheral IV 05/22/20 Right Antecubital 05/22/20  1230  Antecubital  1   Fistula / Graft Left Upper arm Arteriovenous vein graft 04/16/16  --  Upper arm  1498   Hemodialysis Catheter Right Internal jugular Double lumen Permanent (Tunneled) 04/16/20  1559   Internal jugular  37   Sheath 03/24/18 Left Venous 03/24/18  1017  Venous  791   Sheath 09/07/18 Left Venous 09/07/18  1013  Venous  624   Incision (Closed) 09/16/18 Back Other (Comment) 09/16/18  0915   615          Intake/Output Last 24 hours  Intake/Output Summary (Last 24 hours) at 05/23/2020 1414 Last data filed at 05/23/2020 0555 Gross per 24 hour  Intake --  Output 1100 ml  Net -1100 ml    Labs/Imaging Results for orders placed or performed during the hospital encounter of 05/22/20 (from the past 48 hour(s))  CBC with Differential     Status: Abnormal   Collection Time: 05/22/20 10:47 AM  Result Value Ref Range   WBC 8.7 4.0 - 10.5 K/uL   RBC 2.79 (L) 4.22 - 5.81 MIL/uL   Hemoglobin 9.3 (L) 13.0 - 17.0 g/dL   HCT 28.5 (L) 39 - 52 %   MCV 102.2 (H) 80.0 - 100.0 fL   MCH 33.3 26.0 - 34.0 pg   MCHC 32.6 30.0 - 36.0 g/dL   RDW 16.1 (H) 11.5 - 15.5 %   Platelets 178 150 - 400 K/uL   nRBC 0.0 0.0 - 0.2 %   Neutrophils Relative % 69 %   Neutro Abs 6.2 1.7 - 7.7 K/uL   Lymphocytes Relative 19 %   Lymphs Abs 1.6 0.7 - 4.0 K/uL   Monocytes Relative 6 %   Monocytes Absolute 0.5 0.1 - 1.0 K/uL   Eosinophils Relative 3 %   Eosinophils Absolute 0.2 0.0 - 0.5 K/uL   Basophils Relative 1 %   Basophils Absolute 0.1 0.0 - 0.1 K/uL   Immature Granulocytes 2 %   Abs Immature Granulocytes 0.14 (H) 0.00 - 0.07 K/uL    Comment: Performed at [facility], [address] [place], [place] [address]  Comprehensive metabolic panel     Status: Abnormal   Collection Time: 05/22/20 10:47 AM  Result Value Ref Range   Sodium 138 135 - 145 mmol/L   Potassium 4.4 3.5 - 5.1 mmol/L   Chloride 101 98 - 111 mmol/L   CO2 22 22 - 32 mmol/L   Glucose, Bld 98 70 - 99 mg/dL    Comment: Glucose reference range applies only to samples taken after fasting for at least 8 hours.   BUN 34 (H) 8 - 23 mg/dL   Creatinine, Ser 7.58 (H) 0.61 - 1.24 mg/dL   Calcium 10.2 8.9 - 10.3 mg/dL   Total  Protein 7.3 6.5 - 8.1 g/dL   Albumin 3.4 (L) 3.5 - 5.0 g/dL   AST 24 15 - 41 U/L   ALT 16 0 - 44 U/L   Alkaline Phosphatase 104 38 - 126 U/L   Total Bilirubin 0.9 0.3 - 1.2 mg/dL   GFR, Estimated 6 (L) >60 mL/min    Comment: (NOTE) Calculated using the CKD-EPI Creatinine Equation (2021)    Anion gap 15 5 - 15    Comment: Performed at [facility], [address] [place], [place] [address]  Lipase, blood     Status: None   Collection Time:  05/22/20 10:47 AM  Result Value Ref Range   Lipase 29 11 - 51 U/L    Comment: Performed at [facility], [address] [place], [place] [address]  Urinalysis, Complete w Microscopic Urine, Clean Catch     Status: Abnormal   Collection Time: 05/22/20 10:48 AM  Result Value Ref Range   Color, Urine YELLOW (A) YELLOW   APPearance CLOUDY (A) CLEAR   Specific Gravity, Urine 1.009 1.005 - 1.030   pH 8.0 5.0 - 8.0   Glucose, UA 50 (A) NEGATIVE mg/dL   Hgb urine dipstick NEGATIVE NEGATIVE   Bilirubin Urine NEGATIVE NEGATIVE   Ketones, ur NEGATIVE NEGATIVE mg/dL   Protein, ur 100 (A) NEGATIVE mg/dL   Nitrite NEGATIVE NEGATIVE   Leukocytes,Ua LARGE (A) NEGATIVE   RBC / HPF 21-50 0 - 5 RBC/hpf   WBC, UA >50 (H) 0 - 5 WBC/hpf   Bacteria, UA RARE (A) NONE SEEN   Squamous Epithelial / LPF 0-5 0 - 5    Comment: Performed at [facility], [address], [place], [place] [address]  Urine culture     Status: Abnormal (Preliminary result)   Collection Time: 05/22/20 10:48 AM   Specimen: Urine, Random  Result Value Ref Range   Specimen Description      URINE, RANDOM Performed at [facility], [address], [place], [place] [address]    Special Requests      NONE Performed at [facility], [address] [place], [place] [address]    Culture >=100,000 COLONIES/mL GRAM NEGATIVE RODS (A)    Report Status PENDING   Resp Panel by RT-PCR (Flu A&B, Covid) Nasopharyngeal Swab     Status: None    Collection Time: 05/22/20 10:48 AM   Specimen: Nasopharyngeal Swab; Nasopharyngeal(NP) swabs in vial transport medium  Result Value Ref Range   SARS Coronavirus 2 by RT PCR NEGATIVE NEGATIVE    Comment: (NOTE) SARS-CoV-2 target nucleic acids are NOT DETECTED.  The SARS-CoV-2 RNA is generally detectable in upper respiratory specimens during the acute phase of infection. The lowest concentration of SARS-CoV-2 viral copies this assay can detect is 138 copies/mL. A negative result does not preclude SARS-Cov-2 infection and should not be used as the sole basis for treatment or other patient management decisions. A negative result may occur with  improper specimen collection/handling, submission of specimen other than nasopharyngeal swab, presence of viral mutation(s) within the areas targeted by this assay, and inadequate number of viral copies(<138 copies/mL). A negative result must be combined with clinical observations, patient history, and epidemiological information. The expected result is Negative.  Fact Sheet for Patients:  EntrepreneurPulse.com.au  Fact Sheet for Healthcare Providers:  IncredibleEmployment.be  This test is no t yet approved or cleared by the [place] FDA and  has been authorized for detection and/or diagnosis of SARS-CoV-2 by FDA under an Emergency Use Authorization (EUA). This EUA will remain  in effect (meaning this test can be used) for the duration of the COVID-19 declaration under Section 564(b)(1) of the Act, 21 U.S.C.section 360bbb-3(b)(1), unless the authorization is terminated  or revoked sooner.       Influenza A by PCR NEGATIVE NEGATIVE   Influenza B by PCR NEGATIVE NEGATIVE    Comment: (NOTE) The Xpert Xpress SARS-CoV-2/FLU/RSV plus assay is intended as an aid in the diagnosis of influenza from Nasopharyngeal swab specimens and should not be used as a sole basis for treatment. Nasal washings  and aspirates are unacceptable  for Xpert Xpress SARS-CoV-2/FLU/RSV testing.  Fact Sheet for Patients: EntrepreneurPulse.com.au  Fact Sheet for Healthcare Providers: IncredibleEmployment.be  This test is not yet approved or cleared by the [place] FDA and has been authorized for detection and/or diagnosis of SARS-CoV-2 by FDA under an Emergency Use Authorization (EUA). This EUA will remain in effect (meaning this test can be used) for the duration of the COVID-19 declaration under Section 564(b)(1) of the Act, 21 U.S.C. section 360bbb-3(b)(1), unless the authorization is terminated or revoked.  Performed at [facility], [address] [place], [place] [address]   CBC     Status: Abnormal   Collection Time: 05/23/20  6:13 AM  Result Value Ref Range   WBC 9.6 4.0 - 10.5 K/uL   RBC 2.86 (L) 4.22 - 5.81 MIL/uL   Hemoglobin 9.5 (L) 13.0 - 17.0 g/dL   HCT 29.2 (L) 39 - 52 %   MCV 102.1 (H) 80.0 - 100.0 fL   MCH 33.2 26.0 - 34.0 pg   MCHC 32.5 30.0 - 36.0 g/dL   RDW 16.0 (H) 11.5 - 15.5 %   Platelets 163 150 - 400 K/uL   nRBC 0.0 0.0 - 0.2 %    Comment: Performed at [facility], [address], [place], [place] [address]  Basic metabolic panel     Status: Abnormal   Collection Time: 05/23/20  6:13 AM  Result Value Ref Range   Sodium 135 135 - 145 mmol/L   Potassium 4.4 3.5 - 5.1 mmol/L   Chloride 98 98 - 111 mmol/L   CO2 26 22 - 32 mmol/L   Glucose, Bld 75 70 - 99 mg/dL    Comment: Glucose reference range applies only to samples taken after fasting for at least 8 hours.   BUN 18 8 - 23 mg/dL   Creatinine, Ser 4.93 (H) 0.61 - 1.24 mg/dL   Calcium 9.8 8.9 - 10.3 mg/dL   GFR, Estimated 11 (L) >60 mL/min    Comment: (NOTE) Calculated using the CKD-EPI Creatinine Equation (2021)    Anion gap 11 5 - 15    Comment: Performed at [facility], [address] [place], [place] [address]   DG Chest 2  View  Result Date: 05/22/2020 CLINICAL DATA:  Weakness EXAM: CHEST - 2 VIEW COMPARISON:  05/12/2020 FINDINGS: Dialysis catheter on the right with tip at the upper cavoatrial junction. Chronic cardiomegaly. Moderate hiatal hernia. Borderline hyperinflation with diaphragm flattening. Extensive venous stenting on the left, likely dialysis related. There is no edema, consolidation, effusion, or pneumothorax. IMPRESSION: Stable exam.  No evidence of acute disease. Electronically Signed   By: Monte Fantasia M.D.   On: 05/22/2020 11:25   CT Head Wo Contrast  Result Date: 05/22/2020 CLINICAL DATA:  Mental status changes EXAM: CT HEAD WITHOUT CONTRAST TECHNIQUE: Contiguous axial images were obtained from the base of the skull through the vertex without intravenous contrast. COMPARISON:  12/28/2006 FINDINGS: Brain: There is atrophy and chronic small vessel disease changes. No acute intracranial abnormality. Specifically, no hemorrhage, hydrocephalus, mass lesion, acute infarction, or significant intracranial injury. Vascular: No hyperdense vessel or unexpected calcification. Skull: No acute calvarial abnormality. Sinuses/Orbits: No acute findings Other: None IMPRESSION: Atrophy, chronic microvascular disease. No acute intracranial abnormality. Electronically Signed   By: Rolm Baptise M.D.   On: 05/22/2020 12:47    Pending Labs [employer] (From admission, onward)          Start     Ordered   Signed  and Held  Phosphorus  Once,   R        Signed and Held          Vitals/Pain Today's Vitals   05/23/20 1230 05/23/20 1300 05/23/20 1330 05/23/20 1400  BP: (!) 207/191 (!) 141/71 (!) 164/108 91/60  Pulse: 65 (!) 56 (!) 140 70  Resp: (!) 24 12 17 19   Temp:    97.8 F (36.6 C)  TempSrc:    Oral  SpO2: 96% 94% 98% 95%  Weight:      Height:      PainSc:    0-No pain    Isolation Precautions No active isolations  Medications Medications  atorvastatin (LIPITOR) tablet 10 mg (0 mg Oral Hold  05/22/20 1901)  hydrALAZINE (APRESOLINE) tablet 25 mg (25 mg Oral Given 05/23/20 1047)  lisinopril (ZESTRIL) tablet 20 mg (20 mg Oral Given 05/23/20 1048)  metoprolol tartrate (LOPRESSOR) tablet 12.5 mg ( Oral Not Given 05/23/20 1046)  levothyroxine (SYNTHROID) tablet 125 mcg (125 mcg Oral Given 05/23/20 0616)  calcium acetate (PHOSLO) capsule 1,334 mg (1,334 mg Oral Given 05/23/20 1315)  multivitamin (RENA-VIT) tablet 1 tablet (1 tablet Oral Given 05/23/20 1048)  Vitamin D (Ergocalciferol) (DRISDOL) capsule 50,000 Units (50,000 Units Oral Given 05/23/20 1044)  omega-3 acid ethyl esters (LOVAZA) capsule 2,000 mg (2,000 mg Oral Given 05/23/20 1044)  heparin injection 5,000 Units (5,000 Units Subcutaneous Given 05/23/20 0614)  acetaminophen (TYLENOL) tablet 325 mg (325 mg Oral Given 05/22/20 2117)    Or  acetaminophen (TYLENOL) suppository 325 mg ( Rectal See Alternative 05/22/20 2117)  ondansetron (ZOFRAN) tablet 4 mg ( Oral See Alternative 05/22/20 1314)    Or  ondansetron (ZOFRAN) injection 4 mg (4 mg Intravenous Given 05/22/20 1314)  hydrALAZINE (APRESOLINE) tablet 25 mg (25 mg Oral Not Given 05/22/20 2045)  hydrALAZINE (APRESOLINE) injection 10 mg (10 mg Intravenous Not Given 05/22/20 2045)  cefTRIAXone (ROCEPHIN) 1 g in sodium chloride 0.9 % 100 mL IVPB (0 g Intravenous Stopped 05/23/20 1315)  cefTRIAXone (ROCEPHIN) 1 g in sodium chloride 0.9 % 100 mL IVPB (0 g Intravenous Stopped 05/22/20 1302)  furosemide (LASIX) injection 60 mg (60 mg Intravenous Given 05/22/20 1316)    Mobility walks with device High fall risk   Focused Assessments    R Recommendations: See Admitting Provider Note  Report given to:   Additional Notes:

## 2020-05-23 NOTE — TOC Initial Note (Signed)
Transition of Care [facility]) - Initial/Assessment Note    Patient Details  Name: Alejandro Armstrong MRN: [id number] Date of Birth: 1929/11/10  Transition of Care [facility]) CM/SW Contact:    Candie Chroman, LCSW Phone Number: 05/23/2020, 3:38 PM  Clinical Narrative: Patient not fully oriented. CSW called patient's wife, introduced role, and explained that PT recommendations would be discussed. Patient's wife is agreeable to SNF placement. First preference is [facility]. Left message for admissions coordinator to notify. Patient's wife lives at [facility]. CSW asked why they lives separately and she said it was because he did not want to move there. She is unsure what plan will be after rehab. No further concerns. CSW encouraged patient's wife to contact CSW as needed. CSW will continue to follow patient and his wife for support and facilitate discharge to SNF once medically stable.                 Expected Discharge Plan: Skilled Nursing Facility Barriers to Discharge: Insurance Authorization, Continued Medical Work up   Patient Goals and CMS Choice Patient states their goals for this hospitalization and ongoing recovery are:: Patient not fully oriented.      Expected Discharge Plan and Services Expected Discharge Plan: [place] Choice: [place] arrangements for the past 2 months: Single Family Home                                      Prior Living Arrangements/Services Living arrangements for the past 2 months: Single Family Home Lives with:: Self Patient language and need for interpreter reviewed:: Yes Do you feel safe going back to the place where you live?: Yes      Need for Family Participation in Patient Care: Yes (Comment) Care giver support system in place?: No (comment)   Criminal Activity/Legal Involvement Pertinent to Current Situation/Hospitalization: No - Comment as needed  Activities of Daily  Living Home Assistive Devices/Equipment: Eyeglasses, Cane (specify quad or straight) ADL Screening (condition at time of admission) Patient's cognitive ability adequate to safely complete daily activities?: No Is the patient deaf or have difficulty hearing?: No Does the patient have difficulty seeing, even when wearing glasses/contacts?: No Does the patient have difficulty concentrating, remembering, or making decisions?: Yes Patient able to express need for assistance with ADLs?: Yes Does the patient have difficulty dressing or bathing?: Yes Independently performs ADLs?: No Communication: Independent Dressing (OT): Needs assistance Is this a change from baseline?: Pre-admission baseline Grooming: Needs assistance Is this a change from baseline?: Pre-admission baseline Feeding: Independent Bathing: Needs assistance Is this a change from baseline?: Pre-admission baseline Toileting: Independent In/Out Bed: Independent Walks in Home: Needs assistance Is this a change from baseline?: Pre-admission baseline Does the patient have difficulty walking or climbing stairs?: Yes Weakness of Legs: Both Weakness of Arms/Hands: Both  Permission Sought/Granted Permission sought to share information with : Facility Sport and exercise psychologist, Family Supports    Share Information with NAME: Jassen Sarver  Permission granted to share info w AGENCY: SNF's  Permission granted to share info w Relationship: Wife  Permission granted to share info w Contact Information: [phone]  Emotional Assessment Appearance:: Appears stated age Attitude/Demeanor/Rapport: Unable to Assess Affect (typically observed): Unable to Assess Orientation: : Oriented to Self, Oriented to Situation Alcohol / Substance Use: Not Applicable Psych Involvement: No (comment)  Admission diagnosis:  ESRD  on dialysis ([place]) [N18.6, Z99.2] Volume overload [E87.70] Acute cystitis without hematuria [N30.00] Generalized weakness  [R53.1] Patient Active Problem List   Diagnosis Date Noted  . Volume overload 05/22/2020  . AV fistula thrombosis ([place]) 04/24/2020  . Leukocytosis 04/24/2020  . Anemia in ESRD (end-stage renal disease) ([place]) 04/24/2020  . Acute hypoxemic respiratory failure ([place]) 04/18/2020  . Pneumonia due to COVID-19 virus   . Generalized weakness   . Recurrent UTI 02/16/2020  . Chest pain 11/04/2019  . Complication of vascular access for dialysis 11/20/2017  . Hyperlipidemia 03/26/2017  . ESRD (end stage renal disease) on dialysis ([place]) 03/26/2017  . Postop check 04/30/2016  . Fever chills 09/18/2015  . Bacteremia 09/18/2015  . Acute on chronic renal insufficiency 09/01/2015  . Hyperkalemia 09/01/2015  . MRSA bacteremia secondary to PICC line infection 09/01/2015  . Type II diabetes mellitus with renal manifestations ([place]) 09/01/2015  . UTI (lower urinary tract infection) 08/06/2015  . Idiopathic thrombocytopenic purpura ([place]) 08/02/2015  . B12 deficiency 07/24/2015  . Pseudomonas infection 04/28/2014  . 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 Urinary urgency 02/21/2013  . Hypertrophy of prostate with urinary obstruction and other lower urinary tract symptoms (LUTS) 11/18/2012  . Calculus of kidney 03/12/2012  . Increased frequency of urination 03/12/2012  . Nocturia 03/12/2012  . 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 Urethral stricture 03/12/2012  . Urinary tract infection 03/12/2012  . Essential hypertension 03/31/2011  . Hypothyroidism 03/31/2011  . Chronic kidney disease, stage IV (severe) ([place]) 03/31/2011   PCP:  Casilda Carls, MD Pharmacy:   [id number] [address]ina [address]on, [place] [address] [place] [address] Phone: [phone] Fax: [phone]     Social Determinants of Health (SDOH) Interventions    Readmission Risk Interventions Readmission Risk Prevention Plan 04/18/2020  Transportation Screening Complete  PCP or Specialist  Appt within 3-5 Days Complete  HRI or [place] Complete  Social Work Consult for [place] Planning/Counseling Complete  Palliative Care Screening Not Applicable  Medication Review Press photographer) Complete  Some recent data might be hidden
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
## 2020-05-23 NOTE — Evaluation (Signed)
Physical Therapy Evaluation Patient Details Name: Alejandro Armstrong MRN: [id number] DOB: October 07, 1929 Today's Date: 05/23/2020   History of Present Illness  Patient is an 86 year old male who presents to the ED for weakness and missed dialysis session on 05/21/20. PMH includes ESRD -HD (MWF), HLD, HTN, DM non insulin dependent, hypothyroidism, ITP, MRSA (2017) with PICC line in R arm, clot formation in R arm, COVID w pneumonia 04/07/20, hypothyroidism, arthritis, kidney stones, renal agenesis, CKD stage IV. [name]  Patient's spouse recently moved into assisted living facility however patient declined moving to facility. Patient is unable to perform ADLs on his own.    Clinical Impression  Patient is a confused 86 year old male who has intermittent confusion and agitation throughout session. Prior to hospital admission, per previous documentation due to patient being poor historian, pt was requiring assistance for ADLs and lives alone in home with stairs to enter.  Currently pt is unsafe and requires assistance for bed mobilities and transfers. Patient was in bed asleep on PT arrival, was awakened and agreeable to therapy despite confusion.  His RR>25 with multiple PVCs upon attempt at side stepping requiring mod/max A to step to reposition towards head of bed. He requires max A to maintain static standing and is unsafe to attempt ambulation in room. Patient intermittently follows simple commands and becomes intermittently confused and agitated asking PT "why are you in my house".   Pt would benefit from skilled PT to address noted impairments and functional limitations (see below for any additional details).  Upon hospital discharge, pt would benefit from SNF due to being unsafe for home at this time.      Follow Up Recommendations SNF    Equipment Recommendations   (none if go to SNF)    Recommendations for Other Services       Precautions / Restrictions Precautions Precautions: Fall Precaution  Comments: confused Restrictions Weight Bearing Restrictions: No      Mobility  Bed Mobility Overal bed mobility: Needs Assistance Bed Mobility: Sit to Supine;Supine to Sit     Supine to sit: Mod assist;HOB elevated Sit to supine: Mod assist;HOB elevated   General bed mobility comments: Patient requires mod A for trunk for supine to sit transfer and max cueing for sequencing. He requires mod A for sit to supine for LE's and trunk placement.    Transfers Overall transfer level: Needs assistance Equipment used: Quad cane Transfers: Sit to/from Stand;Lateral/Scoot Transfers Sit to Stand: Mod assist;From elevated surface        Lateral/Scoot Transfers: Mod assist;Max assist General transfer comment: Patient requires mod A with max cueing for STS transfer; required max A to retain standing position. RR elevated > 25. Patient able to make 3 small side steps on edge of bed with mod/max A. Very unsafe  Ambulation/Gait             General Gait Details: unable to due to safety.  Stairs            Wheelchair Mobility    Modified Rankin (Stroke Patients Only)       Balance Overall balance assessment: Needs assistance Sitting-balance support: Bilateral upper extremity supported Sitting balance-Leahy Scale: Fair Sitting balance - Comments: able to maintain seated posture with BUE support, without support has LOB posterior Postural control: Posterior lean Standing balance support: Bilateral upper extremity supported Standing balance-Leahy Scale: Poor Standing balance comment: Patient requires max A to maintain standing with frequent posterior near LOB.  High Level Balance Comments: small side steps to reposition at EOB with mod/max A required due to frequent near LOB posteriorly.             Pertinent Vitals/Pain Pain Assessment: No/denies pain    Home Living Family/patient expects to be discharged to:: Private residence Living  Arrangements: Alone Available Help at Discharge:  (none) Type of Home: House Home Access: Stairs to enter Entrance Stairs-Rails: Right Entrance Stairs-Number of Steps: 4 Home Layout: One level Home Equipment: Walker - 2 wheels Additional Comments: Patient's information gathered from previous hospitalization and documentation from this hospitalization due to confusion and agitation    Prior Function Level of Independence: Needs assistance   Gait / Transfers Assistance Needed: Patient had a quad cane in room. States he uses it. was unsafe with it however so unsure of level of assistance required at baseline.  ADL's / Homemaking Assistance Needed: per documentation patient requires assistanc with ADLs  Comments: Patient reports he lives alone now that his wife is in SNF, refused/was unable to answer questions due to confusion/agitation. Majority of history obtained from previous documentation.     Hand Dominance   Dominant Hand: Right    Extremity/Trunk Assessment   Upper Extremity Assessment Upper Extremity Assessment: Generalized weakness;Difficult to assess due to impaired cognition    Lower Extremity Assessment Lower Extremity Assessment: Generalized weakness;Difficult to assess due to impaired cognition (Patient demonstrates limited command follow limiting muscle testing. Grossly 3+/5 strength)       Communication   Communication: HOH  Cognition Arousal/Alertness: Awake/alert Behavior During Therapy: WFL for tasks assessed/performed Overall Cognitive Status: No family/caregiver present to determine baseline cognitive functioning                                 General Comments: Patient is confused, oriented to place occasionally, intermittently believes he is home randomly throughout session. Oriented to self.      General Comments General comments (skin integrity, edema, etc.): Patient is not groomed, is unsheveled.    Exercises Other Exercises Other  Exercises: Patient educated on role of PT in acute care setting, safe mobility and transfers, need for safety awareness for decreased fall risk. Donning of slippers in seated position requirs mod A.   Assessment/Plan    PT Assessment Patient needs continued PT services  PT Problem List Decreased strength;Decreased activity tolerance;Decreased balance;Decreased mobility;Cardiopulmonary status limiting activity;Decreased safety awareness;Decreased knowledge of use of DME;Decreased cognition       PT Treatment Interventions DME instruction;Gait training;Stair training;Functional mobility training;Therapeutic activities;Therapeutic exercise;Manual techniques;Patient/family education;Neuromuscular re-education;Cognitive remediation;Balance training    PT Goals (Current goals can be found in the Care Plan section)  Acute Rehab PT Goals PT Goal Formulation: Patient unable to participate in goal setting    Frequency Min 2X/week   Barriers to discharge Inaccessible home environment;Decreased caregiver support Patient is unsafe for home.    Co-evaluation               AM-PAC PT "6 Clicks" Mobility  Outcome Measure Help needed turning from your back to your side while in a flat bed without using bedrails?: A Little Help needed moving from lying on your back to sitting on the side of a flat bed without using bedrails?: A Lot Help needed moving to and from a bed to a chair (including a wheelchair)?: A Lot Help needed standing up from a chair using your arms (e.g., wheelchair or bedside chair)?:  A Lot Help needed to walk in hospital room?: A Lot Help needed climbing 3-5 steps with a railing? : Total 6 Click Score: 12    End of Session Equipment Utilized During Treatment: Gait belt Activity Tolerance: Treatment limited secondary to agitation;Patient limited by fatigue Patient left: in bed;with call bell/phone within reach Nurse Communication: Mobility status;Other (comment) (unsafe for  home. RR elevation, PVCs in standing.) PT Visit Diagnosis: Unsteadiness on feet (R26.81);Other abnormalities of gait and mobility (R26.89);Muscle weakness (generalized) (M62.81);History of falling (Z91.81);Difficulty in walking, not elsewhere classified (R26.2)    Time: 6226-3335 PT Time Calculation (min) (ACUTE ONLY): 16 min   Charges:   PT Evaluation $PT Eval Moderate Complexity: 1 Mod        Janna Arch, PT, DPT   05/23/2020, 10:02 AM
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
## 2020-05-23 NOTE — ED Notes (Signed)
Pt transported to Room 209

## 2020-05-23 NOTE — Progress Notes (Signed)
Pharmacy Antibiotic Note  Alejandro Armstrong is a 86 y.o. male admitted on 05/22/2020 with UTI.  Pharmacy has been consulted for Cefepime dosing. Patient is ESRD on HD (MWF).  Plan: Cefepime 1g IV q24h  Height: 5\' 7"  (170.2 cm) Weight: 72.6 kg (160 lb) IBW/kg (Calculated) : 66.1  Temp (24hrs), Avg:97.8 F (36.6 C), Min:97.5 F (36.4 C), Max:98 F (36.7 C)  Recent Labs  Lab 05/22/20 1047 05/23/20 0613  WBC 8.7 9.6  CREATININE 7.58* 4.93*    Estimated Creatinine Clearance: 9.7 mL/min (A) (by C-G formula based on SCr of 4.93 mg/dL (H)).    No Known Allergies  Antimicrobials this admission: Ceftriaxone 12/7 >> 12/8 Cefepime 12/8 >>   Dose adjustments this admission:  Microbiology results: 12/7 UCx: >100,000 cfu Pseudomonas aeruginosa (sensitivities pending)  Thank you for allowing pharmacy to be a part of this patient's care.  Paulina Fusi, PharmD, BCPS 05/23/2020 5:16 PM

## 2020-05-24 DIAGNOSIS — E8779 Other fluid overload: Secondary | ICD-10-CM | POA: Diagnosis not present

## 2020-05-24 LAB — PHOSPHORUS: Phosphorus: 2.3 mg/dL — ABNORMAL LOW (ref 2.5–4.6)

## 2020-05-24 MED ORDER — CIPROFLOXACIN HCL 500 MG PO TABS: 500.0000 mg | ORAL_TABLET | Freq: Every day | ORAL | 0 refills | Status: AC

## 2020-05-24 MED ORDER — CIPROFLOXACIN HCL 500 MG PO TABS: 500.0000 mg | ORAL_TABLET | Freq: Every day | ORAL | Status: DC
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
## 2020-05-24 NOTE — TOC Transition Note (Signed)
Transition of Care [facility]) - CM/SW Discharge Note   Patient Details  Name: Alejandro Armstrong MRN: [id number] Date of Birth: [birth year]  [facility]) CM/SW Contact:  Candie Chroman, LCSW Phone Number: 05/24/2020, 3:06 PM   Clinical Narrative:  Patient has insurance approval ([id number]) and orders to discharge to [facility] today. [facility] can take him to HD tomorrow and are aware that he needs to be there 30-45 minutes early for paperwork. Per RN, after HD patient was able to answer some questions appropriately but was still confused. CSW and RN met with patient to notify him of PT recommendations. He is agreeable to SNF placement. Patient is focused on getting clothes. CSW explained that we will have his sister bring his clothes to the facility. Wife and sister have been updated. Gave SNF admissions coordinator sister's phone number to arrange time to complete admissions paperwork. CSW notified her that since he is not vaccinated, he will have to quarantine for 14 days. RN will call report to [phone]. EMS transport has been arranged and he is next on the list. RN aware. No further concerns. CSW signing off.  Final next level of care: Skilled Nursing Facility Barriers to Discharge: Barriers Resolved   Patient Goals and CMS Choice Patient states their goals for this hospitalization and ongoing recovery are:: Patient not fully oriented.   Choice offered to / list presented to : Spouse,Sibling  Discharge Placement   Existing PASRR number confirmed : 05/23/20          Patient chooses bed at: [facility] [place] Patient to be transferred to facility by: EMS Name of family member notified: Shawna Clamp and Janalyn Shy Patient and family notified of of transfer: 05/24/20  Discharge Plan and Services     Post Acute Care Choice: [place]                               Social Determinants of Health (SDOH) Interventions     Readmission  Risk Interventions Readmission Risk Prevention Plan 04/18/2020  Transportation Screening Complete  PCP or Specialist Appt within 3-5 Days Complete  HRI or [place] Complete  Social Work Consult for [place] Planning/Counseling Complete  Palliative Care Screening Not Applicable  Medication Review Press photographer) Complete  Some recent data might be hidden

## 2020-05-24 NOTE — Care Management Obs Status (Signed)
[place] NOTIFICATION   Patient Details  Name: Alejandro Armstrong MRN: [id number] Date of Birth: 03-07-1930   Medicare Observation Status Notification Given:  Yes    Candie Chroman, LCSW 05/24/2020, 2:57 PM

## 2020-05-24 NOTE — Care Management CC44 (Signed)
Condition Code 44 Documentation Completed  Patient Details  Name: Alejandro Armstrong MRN: [id number] Date of Birth: Jul 08, 1929   Condition Code 44 given:  Yes Patient signature on Condition Code 44 notice:  Yes Documentation of 2 MD's agreement:  Yes Code 44 added to claim:  Yes    Candie Chroman, LCSW 05/24/2020, 2:57 PM

## 2020-05-24 NOTE — Progress Notes (Signed)
Report called to [facility]. Pt dressed and belongings are with patient, his hearing aid is placed in the bag with his shoes and I have informed the RN at [facility] as well.

## 2020-05-24 NOTE — TOC Progression Note (Addendum)
Transition of Care [facility]) - Progression Note    Patient Details  Name: Alejandro Armstrong MRN: [id number] Date of Birth: 05/10/1930  Transition of Care [facility]) CM/SW [place], LCSW Phone Number: 05/24/2020, 12:08 PM  Clinical Narrative:   Per RN, patient still confused. Was yelling out for help all morning prior to going to HD. CSW spoke with sister in room while patient in HD. She showed CSW HCPOA paperwork which lists patient's wife as primary HCPOA and sister as secondary. CSW updated sister on SNF recommendation. She is agreeable to patient going to [facility] as well. Sister reported that she and her husband often have to go to the patient's house in the middle of the night due to falls. She is working on hoping he can get into [facility]F after rehab. Her sister-in-law is a resident there. CSW called patient's wife to let her know that [facility] can offer a bed. They are also able to transport him to HD at [facility]. CSW notified HD coordinator. She is going to see if [facility] wants to keep him or if they still want him to transition to [employer]. Will start insurance authorization for [facility] once this is confirmed.  1:09 pm: Patient will go to [facility]. [facility] admissions coordinator is checking to see if they would be able to transport him tomorrow. Chair time will be MWF at 11:45. CSW spoke with sister. Patient does no appear to have had his COVID vaccine.  Expected Discharge Plan: [place] Barriers to Discharge: Insurance Authorization,Continued Medical Work up  Expected Discharge Plan and Services Expected Discharge Plan: [place] Choice: [place] arrangements for the past 2 months: Single Family Home                                       Social Determinants of Health (SDOH) Interventions    Readmission Risk Interventions Readmission  Risk Prevention Plan 04/18/2020  Transportation Screening Complete  PCP or Specialist Appt within 3-5 Days Complete  HRI or [place] Complete  Social Work Consult for [place] Planning/Counseling Complete  Palliative Care Screening Not Applicable  Medication Review Press photographer) Complete  Some recent data might be hidden

## 2020-05-24 NOTE — Progress Notes (Signed)
Complains of feeling nausea during hemodialysis. No vomiting noted at this time. Floor nurse Phoebe Sharps, RN  made aware. Stated will bring medication to dialysis suite for the nausea.

## 2020-05-24 NOTE — Progress Notes (Signed)
OT Cancellation Note  Patient Details Name: Alejandro Armstrong MRN: [id number] DOB: [birth year]   Cancelled Treatment:    Reason Eval/Treat Not Completed: Patient at procedure or test/ unavailable. OT order received and chart reviewed. Pt currently out of the room at dialysis. OT will follow up when pt is available for OT intervention.    Darleen Crocker, [place], OTR/L , CBIS ascom [phone]  05/24/20, 10:22 AM   05/24/2020, 10:21 AM

## 2020-05-24 NOTE — Discharge Instructions (Signed)
Deconditioning Deconditioning refers to the changes in the body that occur during a period of inactivity. The changes happen in the heart, lungs, and muscles. They make you feel tired and weak (fatigued) and decrease your ability to be active. The three stages of deconditioning include:  Mild deconditioning. This is a change in your ability to do your usual exercise activities, such as running, biking, or swimming.  Moderate deconditioning. This is a change in your ability to do normal everyday activities, such as walking, shopping for groceries, and doing chores.  Severe deconditioning. In this stage, you may not be able to do minimal activity or usual self-care. What are the causes? Deconditioning can occur after only a few days of inactivity. The longer the period of inactivity, the more severe the deconditioning will be, and the longer it will take to return to your previous level of functioning. Deconditioning is often caused by inactivity due to:  Illnesses, such as cancer, stroke, heart attack, fibromyalgia, and chronic fatigue syndrome.  Injuries, especially back injuries, broken bones, and injuries to soft tissues, such as ligaments and tendons.  A long stay in the hospital.  Pregnancy, especially if long periods of bed rest are needed. What increases the risk? The following factors may make you more likely to develop this condition:  Staying in the hospital or being on bed rest.  Obesity.  Poor nutrition.  Being an older adult.  Having an injury or illness that affects your movement and activity. What are the signs or symptoms? Symptoms of this condition include:  Weakness and tiredness.  Shortness of breath with minor physical effort (exertion).  A heartbeat that is faster than normal. You may not notice this without taking your pulse.  Pain or discomfort with activity.  Decreased strength, endurance, and balance.  Difficulty doing your usual forms of  exercise.  Difficulty doing activities of daily living, such as grocery shopping or chores. You may also have problems walking around the house and doing basic self-care, such as getting to the bathroom, preparing meals, or doing laundry. How is this diagnosed? This condition is diagnosed based on your medical history and a physical exam. During the physical exam, your health care provider will check for signs of deconditioning, such as:  Decreased size of muscles.  Decreased strength.  Trouble with balance.  Shortness of breath or a heart rate that is faster than normal after minor exertion. How is this treated? Treatment for this condition involves an exercise program in which activity is increased slowly. Your health care provider will tell you which exercises are right for you. The exercise program will likely include:  Aerobic exercise. This type of exercise helps improve the functioning of the heart, lungs, and muscles.  Strength training. This type of exercise helps increase muscle size and strength. Both of these types of exercise will improve your endurance. You may be referred to a physical therapist who can create a safe strengthening program for you to follow. Follow these instructions at home: Eating and drinking   Eat a healthy, well-balanced diet. This includes: ? Proteins, such as lean meats and fish, to build muscles. ? Fresh fruits and vegetables. ? Carbohydrates, such as whole grains, to boost energy.  Drink enough fluid to keep your urine pale yellow. Activity   Follow the exercise program that is recommended by your health care provider or physical therapist.  Do not increase your exercise any faster than directed. General instructions  Take over-the-counter and prescription medicines only   as told by your health care provider.  Do not use any products that contain nicotine or tobacco, such as cigarettes, e-cigarettes, and chewing tobacco. If you need help  quitting, ask your health care provider.  Keep all follow-up visits as told by your health care provider. This is important. Contact a health care provider if:  You are not able to do the recommended exercise program.  You are becoming more and more tired and weak.  You become light-headed when rising to a sitting or standing position.  Your level of endurance decreases after it has improved. Get help right away if you:  Have chest pain.  Are very short of breath.  Have any episodes of fainting. Summary  Deconditioning refers to the changes in the body that occur during a period of inactivity.  Deconditioning happens in the heart, lungs, and muscles. The changes make you feel tired and weak and decrease your ability to be active.  Treatment for deconditioning involves an exercise program in which activity is increased slowly. This information is not intended to replace advice given to you by your health care provider. Make sure you discuss any questions you have with your health care provider. Document Revised: 10/28/2018 Document Reviewed: 10/28/2018 Elsevier Patient Education  [place].   Weakness Weakness is a lack of strength. You may feel weak all over your body (generalized), or you may feel weak in one part of your body (focal). There are many potential causes of weakness. Sometimes, the cause of your weakness may not be known. Some causes of weakness can be serious, so it is important to see your doctor. Follow these instructions at home: Activity  Rest as needed.  Try to get enough sleep. Most adults need 7-8 hours of sleep each night. Talk to your doctor about how much sleep you need each night.  Do exercises, such as arm curls and leg raises, for 30 minutes at least 2 days a week or as told by your doctor.  Think about working with a physical therapist or trainer to help you get stronger. General instructions   Take over-the-counter and prescription  medicines only as told by your doctor.  Eat a healthy, well-balanced diet. This includes: ? Proteins to build muscles, such as lean meats and fish. ? Fresh fruits and vegetables. ? Carbohydrates to boost energy, such as whole grains.  Drink enough fluid to keep your pee (urine) pale yellow.  Keep all follow-up visits as told by your doctor. This is important. Contact a doctor if:  Your weakness does not get better or it gets worse.  Your weakness affects your ability to: ? Think clearly. ? Do your normal daily activities. Get help right away if you:  Have sudden weakness on one side of your face or body.  Have chest pain.  Have trouble breathing or shortness of breath.  Have problems with your vision.  Have trouble talking or swallowing.  Have trouble standing or walking.  Are light-headed.  Pass out (lose consciousness). Summary  Weakness is a lack of strength. You may feel weak all over your body or just in one part of your body.  There are many potential causes of weakness. Sometimes, the cause of your weakness may not be known.  Rest as needed, and try to get enough sleep. Most adults need 7-8 hours of sleep each night.  Eat a healthy, well-balanced diet. This information is not intended to replace advice given to you by your health care  provider. Make sure you discuss any questions you have with your health care provider. Document Revised: 01/06/2018 Document Reviewed: 01/06/2018 Elsevier Patient Education  2020 [place].   Dialysis Dialysis is a procedure that is done when the kidneys have stopped working properly (kidney failure). It may also be done earlier if it may help improve symptoms. During dialysis, wastes, salt, and extra water are removed from the blood, and the levels of certain minerals in the blood are maintained. Dialysis is done in sessions which are continued until the kidneys get better. If the kidneys cannot get better, such as in  end-stage kidney disease, dialysis is continued for life or until you receive a new kidney from a donor (kidney transplant). There are two types of dialysis: hemodialysis and peritoneal dialysis. What is hemodialysis?        Hemodialysis is when a machine called a dialyzer is used to filter the blood. Before starting hemodialysis, you will have surgery to create a site where blood can be removed from the body and returned to the body (vascular access). There are three types of vascular accesses:  Arteriovenous fistula. This type of access is created when an artery and a vein (usually in the arm) are connected during surgery. The arteriovenous fistula usually takes 1-6 months to develop after surgery. It may last longer than the other types of vascular accesses and is less likely to become infected or cause blood clots.  Arteriovenous graft. This type of access is created when an artery and a vein in the arm are connected during surgery with a tube. An arteriovenous graft can usually be used within 2-3 weeks of surgery.  A venous catheter. To create this type of access, a thin tube (catheter) is placed in a large vein in your neck, chest, or groin. A venous catheter can be used right away. It is usually used as a temporary access when dialysis needs to begin immediately. During hemodialysis, blood leaves your body through your access site. It travels through a tube to the dialyzer, where it is filtered. The blood then returns to your body through another tube. Hemodialysis is usually done at a hospital or dialysis center three times a week. Visits last about 3-5 hours. With special training, it may also be done at home with the help of another person. What is peritoneal dialysis? Peritoneal dialysis is when the thin lining of the abdomen (peritoneum) and a fluid called dialysate are used to filter the blood. Before starting peritoneal dialysis, you will have surgery to place a catheter in your  abdomen. The catheter will be used to transfer dialysate to and from your abdomen. At the start of a session, your abdomen is filled with dialysate. During the session, wastes, salt, and extra water in the blood pass through the peritoneum and into the dialysate. The dialysate is drained from the body at the end of the session. The process of filling and draining the dialysate is called an exchange. Exchanges are repeated until you have used up all the dialysate for the day. You may do peritoneal dialysis at home or at almost any other location. It is done every day. You may need up to five exchanges a day. Each exchange takes about 30-40 minutes. The amount of time the dialysate is in your body between exchanges is called a dwell. The dwell usually lasts 1.5-3 hours and can vary with each person. You may choose to do exchanges at night while you sleep, using a machine called  a cycler. Which type of dialysis should I choose? Both types of dialysis have advantages and disadvantages. Talk with your health care provider about which type of dialysis is best for you. Your lifestyle, preferences, and medical condition should be considered. In some cases, only one type of dialysis can be chosen. Advantages of hemodialysis  It is done less often than peritoneal dialysis.  Someone else can do the dialysis for you.  If you go to a dialysis center: ? Your health care provider can recognize any problems you may be having. ? You can interact with others who are having dialysis. This can provide you with emotional support. Disadvantages of hemodialysis  Hemodialysis may cause cramps and low blood pressure. It may leave you feeling tired on the days you have the treatment.  If you go to a dialysis center, you will need to make weekly appointments and work around the center's schedule.  You will need to take extra care when traveling. If you usually get treatment in a dialysis center, you will need to arrange to  visit a dialysis center near your destination. If you are having treatments at home, you will need to take the dialyzer with you when traveling.  There are more eating restrictions than with peritoneal dialysis. Advantages of peritoneal dialysis  It is less likely than hemodialysis to cause cramps and low blood pressure.  There are fewer eating restrictions than with hemodialysis.  You may do exchanges on your own wherever you are, including when you travel. Disadvantages of peritoneal dialysis  It is done more often than hemodialysis.  Doing peritoneal dialysis requires you to have a good use (dexterity) of your hands. You must also be able to lift bags.  You must learn how to make your equipment free of germs (sterilization techniques). You will need to use these techniques every day to prevent infection. What changes will I need to make to my diet during dialysis? Both types of dialysis require you to make some changes to your diet. For example, you will need to limit your intake of foods that contain a lot of phosphorus and potassium. You will also need to limit your fluid intake. A diet and nutrition specialist (dietitian) can help you make a meal plan that can help improve your dialysis and your health. What should I expect when starting dialysis? Adjusting to the dialysis treatment, schedule, and diet can take some time. You may need to stop working and may not be able to do some of your normal activities. You may feel anxious or depressed when starting dialysis. Over time, many people feel better overall because of dialysis. You may be able to return to work after making some changes, such as reducing work intensity. Where to find more information  [place]: www.kidney.org  American Association of Kidney Patients: BombTimer.gl  [employer]: www.kidneyfund.org Summary  During dialysis, wastes, salt, and extra water are removed from the blood, and the  levels of certain minerals in the blood are maintained. There are two types of dialysis: hemodialysis and peritoneal dialysis.  Hemodialysis is when a machine called a dialyzer is used to filter the blood.  Hemodialysis is usually done by a health care provider at a hospital or dialysis center three times a week.  Peritoneal dialysis is when the peritoneum is used as a filter. You may do peritoneal dialysis at home or at almost any other location.  Both types of dialysis have advantages and disadvantages. Talk with your health  care provider about which type of dialysis is best for you. This information is not intended to replace advice given to you by your health care provider. Make sure you discuss any questions you have with your health care provider. Document Revised: 10/19/2018 Document Reviewed: 07/29/2016 Elsevier Patient Education  [place].  Urinary Tract Infection, Adult A urinary tract infection (UTI) is an infection of any part of the urinary tract. The urinary tract includes:  The kidneys.  The ureters.  The bladder.  The urethra. These organs make, store, and get rid of pee (urine) in the body. What are the causes? This is caused by germs (bacteria) in your genital area. These germs grow and cause swelling (inflammation) of your urinary tract. What increases the risk? You are more likely to develop this condition if:  You have a small, thin tube (catheter) to drain pee.  You cannot control when you pee or poop (incontinence).  You are male, and: ? You use these methods to prevent pregnancy:  A medicine that kills sperm (spermicide).  A device that blocks sperm (diaphragm). ? You have low levels of a male hormone (estrogen). ? You are pregnant.  You have genes that add to your risk.  You are sexually active.  You take antibiotic medicines.  You have trouble peeing because of: ? A prostate that is bigger than normal, if you are male. ? A  blockage in the part of your body that drains pee from the bladder (urethra). ? A kidney stone. ? A nerve condition that affects your bladder (neurogenic bladder). ? Not getting enough to drink. ? Not peeing often enough.  You have other conditions, such as: ? Diabetes. ? A weak disease-fighting system (immune system). ? Sickle cell disease. ? Gout. ? Injury of the spine. What are the signs or symptoms? Symptoms of this condition include:  Needing to pee right away (urgently).  Peeing often.  Peeing small amounts often.  Pain or burning when peeing.  Blood in the pee.  Pee that smells bad or not like normal.  Trouble peeing.  Pee that is cloudy.  Fluid coming from the vagina, if you are male.  Pain in the belly or lower back. Other symptoms include:  Throwing up (vomiting).  No urge to eat.  Feeling mixed up (confused).  Being tired and grouchy (irritable).  A fever.  Watery poop (diarrhea). How is this treated? This condition may be treated with:  Antibiotic medicine.  Other medicines.  Drinking enough water. Follow these instructions at home:  Medicines  Take over-the-counter and prescription medicines only as told by your doctor.  If you were prescribed an antibiotic medicine, take it as told by your doctor. Do not stop taking it even if you start to feel better. General instructions  Make sure you: ? Pee until your bladder is empty. ? Do not hold pee for a long time. ? Empty your bladder after sex. ? Wipe from front to back after pooping if you are a male. Use each tissue one time when you wipe.  Drink enough fluid to keep your pee pale yellow.  Keep all follow-up visits as told by your doctor. This is important. Contact a doctor if:  You do not get better after 1-2 days.  Your symptoms go away and then come back. Get help right away if:  You have very bad back pain.  You have very bad pain in your lower belly.  You have a  fever.  You are sick to your stomach (nauseous).  You are throwing up. Summary  A urinary tract infection (UTI) is an infection of any part of the urinary tract.  This condition is caused by germs in your genital area.  There are many risk factors for a UTI. These include having a small, thin tube to drain pee and not being able to control when you pee or poop.  Treatment includes antibiotic medicines for germs.  Drink enough fluid to keep your pee pale yellow. This information is not intended to replace advice given to you by your health care provider. Make sure you discuss any questions you have with your health care provider. Document Revised: 05/20/2018 Document Reviewed: 12/10/2017 Elsevier Patient Education  2020 [employer].

## 2020-05-24 NOTE — Progress Notes (Addendum)
[facility]  ROUNDING NOTE   Subjective:  Patient seen in dialysis today ,tolerating treatment well.  Patient's  sister visiting him, discussed with her regarding safety issues and requirement for assistance with ADLs and transportation for dialysis treatments.  She verbalized her concerns of  being overwhelmed taking care of him. She is in agreement for a SNF placement for the patient.    HEMODIALYSIS FLOWSHEET:  Blood Flow Rate (mL/min): 300 mL/min Arterial Pressure (mmHg): -110 mmHg Venous Pressure (mmHg): 100 mmHg Transmembrane Pressure (mmHg): 50 mmHg Ultrafiltration Rate (mL/min): 330 mL/min Dialysate Flow Rate (mL/min): 600 ml/min Conductivity: Machine : 13.7 Conductivity: Machine : 13.7 Dialysis Fluid Bolus: Normal Saline Bolus Amount (mL): 250 mL   Objective:  Vital signs in last 24 hours:  Temp:  [97.8 F (36.6 C)-98.5 F (36.9 C)] 97.9 F (36.6 C) (12/09 1022) Pulse Rate:  [25-140] 46 (12/09 1022) Resp:  [12-24] 13 (12/09 1022) BP: (91-223)/(60-205) 182/87 (12/09 1022) SpO2:  [94 %-100 %] 99 % (12/09 0841)  Weight change:  Filed Weights   05/22/20 1026  Weight: 72.6 kg    Intake/Output: I/O last 3 completed shifts: In: 0  Out: 1150 [Urine:350; Other:800]   Intake/Output this shift:  No intake/output data recorded.  Physical Exam: General:  Lying in bed, receiving dialysis treatment  Head:  Normocephalic, atraumatic. Moist oral mucosal membranes  Eyes:  Sclerae and conjunctivae clear  Lungs:   Respirations symmetrical and unlabored, lungs clear  Heart:  Regular rate and rhythm  Abdomen:   Soft, nontender, nondistended  Extremities:  No peripheral edema.  Neurologic:  Sleeping in bed, arousable to call  Skin:  No acute lesions or rashes  Access:  IJ PermCath in place    Basic Metabolic Panel: Recent Labs  Lab 05/22/20 1047 05/23/20 0613 05/24/20 1050  NA 138 135  --   K 4.4 4.4  --   CL 101 98  --   CO2 22 26  --   GLUCOSE 98  75  --   BUN 34* 18  --   CREATININE 7.58* 4.93*  --   CALCIUM 10.2 9.8  --   PHOS  --   --  2.3*    Liver Function Tests: Recent Labs  Lab 05/22/20 1047  AST 24  ALT 16  ALKPHOS 104  BILITOT 0.9  PROT 7.3  ALBUMIN 3.4*   Recent Labs  Lab 05/22/20 1047  LIPASE 29   No results for input(s): AMMONIA in the last 168 hours.  CBC: Recent Labs  Lab 05/22/20 1047 05/23/20 0613  WBC 8.7 9.6  NEUTROABS 6.2  --   HGB 9.3* 9.5*  HCT 28.5* 29.2*  MCV 102.2* 102.1*  PLT 178 163    Cardiac Enzymes: No results for input(s): CKTOTAL, CKMB, CKMBINDEX, TROPONINI in the last 168 hours.  BNP: Invalid input(s): POCBNP  CBG: No results for input(s): GLUCAP in the last 168 hours.  Microbiology: Results for orders placed or performed during the hospital encounter of 05/22/20  Urine culture     Status: Abnormal (Preliminary result)   Collection Time: 05/22/20 10:48 AM   Specimen: Urine, Random  Result Value Ref Range Status   Specimen Description   Final    URINE, RANDOM Performed at [facility], [address] [place], [place] [address]    Special Requests   Final    NONE Performed at [facility], [address], [place], [place] [address]    Culture (A)  Final    >=  100,000 COLONIES/mL PSEUDOMONAS AERUGINOSA SUSCEPTIBILITIES TO FOLLOW Performed at [facility], [place] [address], [place], [place] [address]    Report Status PENDING  Incomplete  Resp Panel by RT-PCR (Flu A&B, Covid) Nasopharyngeal Swab     Status: None   Collection Time: 05/22/20 10:48 AM   Specimen: Nasopharyngeal Swab; Nasopharyngeal(NP) swabs in vial transport medium  Result Value Ref Range Status   SARS Coronavirus 2 by RT PCR NEGATIVE NEGATIVE Final    Comment: (NOTE) SARS-CoV-2 target nucleic acids are NOT DETECTED.  The SARS-CoV-2 RNA is generally detectable in upper respiratory specimens during the acute phase of infection. The lowest concentration of SARS-CoV-2  viral copies this assay can detect is 138 copies/mL. A negative result does not preclude SARS-Cov-2 infection and should not be used as the sole basis for treatment or other patient management decisions. A negative result may occur with  improper specimen collection/handling, submission of specimen other than nasopharyngeal swab, presence of viral mutation(s) within the areas targeted by this assay, and inadequate number of viral copies(<138 copies/mL). A negative result must be combined with clinical observations, patient history, and epidemiological information. The expected result is Negative.  Fact Sheet for Patients:  EntrepreneurPulse.com.au  Fact Sheet for Healthcare Providers:  IncredibleEmployment.be  This test is no t yet approved or cleared by the [place] FDA and  has been authorized for detection and/or diagnosis of SARS-CoV-2 by FDA under an Emergency Use Authorization (EUA). This EUA will remain  in effect (meaning this test can be used) for the duration of the COVID-19 declaration under Section 564(b)(1) of the Act, 21 U.S.C.section 360bbb-3(b)(1), unless the authorization is terminated  or revoked sooner.       Influenza A by PCR NEGATIVE NEGATIVE Final   Influenza B by PCR NEGATIVE NEGATIVE Final    Comment: (NOTE) The Xpert Xpress SARS-CoV-2/FLU/RSV plus assay is intended as an aid in the diagnosis of influenza from Nasopharyngeal swab specimens and should not be used as a sole basis for treatment. Nasal washings and aspirates are unacceptable for Xpert Xpress SARS-CoV-2/FLU/RSV testing.  Fact Sheet for Patients: EntrepreneurPulse.com.au  Fact Sheet for Healthcare Providers: IncredibleEmployment.be  This test is not yet approved or cleared by the [place] FDA and has been authorized for detection and/or diagnosis of SARS-CoV-2 by FDA under an Emergency Use Authorization  (EUA). This EUA will remain in effect (meaning this test can be used) for the duration of the COVID-19 declaration under Section 564(b)(1) of the Act, 21 U.S.C. section 360bbb-3(b)(1), unless the authorization is terminated or revoked.  Performed at [facility], [address] [place], [place] [address]     Coagulation Studies: No results for input(s): LABPROT, INR in the last 72 hours.  Urinalysis: Recent Labs    05/22/20 1048  COLORURINE YELLOW*  LABSPEC 1.009  PHURINE 8.0  GLUCOSEU 50*  HGBUR NEGATIVE  BILIRUBINUR NEGATIVE  KETONESUR NEGATIVE  PROTEINUR 100*  NITRITE NEGATIVE  LEUKOCYTESUR LARGE*      Imaging: CT Head Wo Contrast  Result Date: 05/22/2020 CLINICAL DATA:  Mental status changes EXAM: CT HEAD WITHOUT CONTRAST TECHNIQUE: Contiguous axial images were obtained from the base of the skull through the vertex without intravenous contrast. COMPARISON:  12/28/2006 FINDINGS: Brain: There is atrophy and chronic small vessel disease changes. No acute intracranial abnormality. Specifically, no hemorrhage, hydrocephalus, mass lesion, acute infarction, or significant intracranial injury. Vascular: No hyperdense vessel or unexpected calcification. Skull: No acute calvarial abnormality. Sinuses/Orbits: No acute findings Other: None IMPRESSION:  Atrophy, chronic microvascular disease. No acute intracranial abnormality. Electronically Signed   By: Rolm Baptise M.D.   On: 05/22/2020 12:47     Medications:   . ceFEPime (MAXIPIME) IV 1 g (05/23/20 2036)   . atorvastatin  10 mg Oral q1800  . calcium acetate  1,334 mg Oral TID WC  . Chlorhexidine Gluconate Cloth  6 each Topical Daily  . heparin  5,000 Units Subcutaneous Q8H  . hydrALAZINE  25 mg Oral BID  . hydrALAZINE  25 mg Oral Once  . levothyroxine  125 mcg Oral Q0600  . lisinopril  20 mg Oral Daily  . metoprolol tartrate  12.5 mg Oral BID  . multivitamin  1 tablet Oral Daily  . omega-3 acid ethyl esters   2,000 mg Oral BID  . Vitamin D (Ergocalciferol)  50,000 Units Oral Q Wed   acetaminophen **OR** acetaminophen, ondansetron **OR** ondansetron (ZOFRAN) IV  Assessment/ Plan:  86 y.o. male with past medical history of ESRD on HD MWF, hyperlipidemia, hypertension, diabetes mellitus type 2, hypothyroidism, ITP, history of MRSA bacteremia, anemia of chronic kidney disease, secondary hyperparathyroidism, generalized debility, history of Covid pneumonia who presented with weakness after missing dialysis session on 05/21/2020.  Patient unable to take care of himself at home.  Transitioning to [facility],anticipated [facility] schedule TTS  1.  ESRD on HD MWF.   Receiving dialysis treatment today Tolerating treatment well  2.  Anemia of chronic kidney disease.  Lab Results  Component Value Date   HGB 9.5 (L) 05/23/2020  Will continue monitoring CBCs closely  3.  Secondary hyperparathyroidism.   Lab Results  Component Value Date   PTH 132 (H) 04/12/2020   CALCIUM 9.8 05/23/2020   PHOS 2.3 (L) 05/24/2020  Hypophosphatemia noted We will discontinue calcium acetate  4.  Generalized weakness. PT recommendation for SNF placement Patient's sister involved in his care, working with the hospital team to find appropriate placement  5. Hypertension Blood pressure readings within acceptable range Continue lisinopril,hydralazine, and metoprolol tartrate   LOS: 0 Eh Sesay 12/9/202111:33 AM
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
## 2020-05-29 ENCOUNTER — Telehealth (INDEPENDENT_AMBULATORY_CARE_PROVIDER_SITE_OTHER): Payer: Self-pay

## 2020-05-29 NOTE — Telephone Encounter (Signed)
I contacted [name] after receiving a fax regarding the patient needed a left AVG declot. Patient is now at [facility] and attempted to contact someone regarding this patient and am unable to speak with anyone. The patient has been scheduled with Dr. Delana Meyer for a LUE AVG declot on 06/05/20 with a 9:00 am arrival time to the [facility]. Covid testing on 06/01/20 between 8-1 pm at the [facility]. Pre-procedure instructions will be faxed to Lujean Rave to [place] and to [facility] as well.

## 2020-05-30 LAB — SUSCEPTIBILITY RESULT

## 2020-05-30 LAB — SUSCEPTIBILITY, AER + ANAEROB: Source of Sample: 8680

## 2020-06-01 ENCOUNTER — Other Ambulatory Visit
Admission: RE | Admit: 2020-06-01 | Discharge: 2020-06-01 | Disposition: A | Discharge status: Home / Self Care | Payer: Medicare HMO | Source: Ambulatory Visit | Attending: Vascular Surgery | Admitting: Vascular Surgery

## 2020-06-01 ENCOUNTER — Other Ambulatory Visit: Payer: Self-pay

## 2020-06-01 DIAGNOSIS — Z20822 Contact with and (suspected) exposure to covid-19: Secondary | ICD-10-CM | POA: Diagnosis not present

## 2020-06-01 DIAGNOSIS — Z01812 Encounter for preprocedural laboratory examination: Secondary | ICD-10-CM | POA: Insufficient documentation

## 2020-06-01 LAB — SARS CORONAVIRUS 2 (TAT 6-24 HRS): SARS Coronavirus 2: NEGATIVE

## 2020-06-05 ENCOUNTER — Encounter
Admission: RE | Disposition: A | Discharge status: Skilled Nursing Facility | Payer: Self-pay | Source: Home / Self Care | Attending: Vascular Surgery

## 2020-06-05 ENCOUNTER — Ambulatory Visit
Admission: RE | Admit: 2020-06-05 | Discharge: 2020-06-05 | Disposition: A | Discharge status: Skilled Nursing Facility | Payer: Medicare HMO | Attending: Vascular Surgery | Admitting: Vascular Surgery

## 2020-06-05 ENCOUNTER — Other Ambulatory Visit (INDEPENDENT_AMBULATORY_CARE_PROVIDER_SITE_OTHER): Payer: Self-pay | Admitting: Nurse Practitioner

## 2020-06-05 DIAGNOSIS — Y832 Surgical operation with anastomosis, bypass or graft as the cause of abnormal reaction of the patient, or of later complication, without mention of misadventure at the time of the procedure: Secondary | ICD-10-CM | POA: Diagnosis not present

## 2020-06-05 DIAGNOSIS — I2510 Atherosclerotic heart disease of native coronary artery without angina pectoris: Secondary | ICD-10-CM | POA: Insufficient documentation

## 2020-06-05 DIAGNOSIS — I1311 Hypertensive heart and chronic kidney disease without heart failure, with stage 5 chronic kidney disease, or end stage renal disease: Secondary | ICD-10-CM | POA: Insufficient documentation

## 2020-06-05 DIAGNOSIS — T82868A Thrombosis of vascular prosthetic devices, implants and grafts, initial encounter: Secondary | ICD-10-CM | POA: Insufficient documentation

## 2020-06-05 DIAGNOSIS — N186 End stage renal disease: Secondary | ICD-10-CM | POA: Insufficient documentation

## 2020-06-05 DIAGNOSIS — I871 Compression of vein: Secondary | ICD-10-CM | POA: Insufficient documentation

## 2020-06-05 DIAGNOSIS — E1122 Type 2 diabetes mellitus with diabetic chronic kidney disease: Secondary | ICD-10-CM | POA: Diagnosis not present

## 2020-06-05 DIAGNOSIS — Z95828 Presence of other vascular implants and grafts: Secondary | ICD-10-CM | POA: Diagnosis not present

## 2020-06-05 DIAGNOSIS — Z8249 Family history of ischemic heart disease and other diseases of the circulatory system: Secondary | ICD-10-CM | POA: Diagnosis not present

## 2020-06-05 DIAGNOSIS — Z992 Dependence on renal dialysis: Secondary | ICD-10-CM | POA: Insufficient documentation

## 2020-06-05 HISTORY — PX: PERIPHERAL VASCULAR THROMBECTOMY: CATH118306

## 2020-06-05 LAB — POTASSIUM (ARMC VASCULAR LAB ONLY): Potassium (ARMC vascular lab): 3.9 (ref 3.5–5.1)

## 2020-06-05 SURGERY — PERIPHERAL VASCULAR THROMBECTOMY
Anesthesia: Moderate Sedation | Site: Arm Upper | Laterality: Left

## 2020-06-05 MED ORDER — CEFAZOLIN SODIUM-DEXTROSE 1-4 GM/50ML-% IV SOLN
1.0000 g | Freq: Once | INTRAVENOUS | Status: AC
  Administered 2020-06-05: 11:00:00 1 g via INTRAVENOUS

## 2020-06-05 MED ORDER — MIDAZOLAM HCL 2 MG/ML PO SYRP: 8.0000 mg | ORAL_SOLUTION | Freq: Once | ORAL | Status: DC | PRN

## 2020-06-05 MED ORDER — METHYLPREDNISOLONE SODIUM SUCC 125 MG IJ SOLR: 125.0000 mg | Freq: Once | INTRAMUSCULAR | Status: DC | PRN

## 2020-06-05 MED ORDER — FENTANYL CITRATE (PF) 100 MCG/2ML IJ SOLN
INTRAMUSCULAR | Status: DC | PRN
  Administered 2020-06-05: 25 ug via INTRAVENOUS

## 2020-06-05 MED ORDER — DIPHENHYDRAMINE HCL 50 MG/ML IJ SOLN: 50.0000 mg | Freq: Once | INTRAMUSCULAR | Status: DC | PRN

## 2020-06-05 MED ORDER — SODIUM CHLORIDE 0.9 % IV SOLN
INTRAVENOUS | Status: DC
  Administered 2020-06-05: 10:00:00 1000 mL via INTRAVENOUS

## 2020-06-05 MED ORDER — MIDAZOLAM HCL 2 MG/2ML IJ SOLN
INTRAMUSCULAR | Status: DC | PRN
  Administered 2020-06-05: 1 mg via INTRAVENOUS

## 2020-06-05 MED ORDER — FENTANYL CITRATE (PF) 100 MCG/2ML IJ SOLN
INTRAMUSCULAR | Status: AC
  Filled 2020-06-05: qty 2

## 2020-06-05 MED ORDER — CEFAZOLIN SODIUM-DEXTROSE 1-4 GM/50ML-% IV SOLN
INTRAVENOUS | Status: AC
  Filled 2020-06-05: qty 50

## 2020-06-05 MED ORDER — ALTEPLASE 2 MG IJ SOLR
INTRAMUSCULAR | Status: AC
  Filled 2020-06-05: qty 6

## 2020-06-05 MED ORDER — FAMOTIDINE 20 MG PO TABS: 40.0000 mg | ORAL_TABLET | Freq: Once | ORAL | Status: DC | PRN

## 2020-06-05 MED ORDER — IODIXANOL 320 MG/ML IV SOLN
INTRAVENOUS | Status: DC | PRN
  Administered 2020-06-05: 12:00:00 25 mL via INTRA_ARTERIAL

## 2020-06-05 MED ORDER — MIDAZOLAM HCL 5 MG/5ML IJ SOLN
INTRAMUSCULAR | Status: AC
  Filled 2020-06-05: qty 5

## 2020-06-05 SURGICAL SUPPLY — 8 items
CATH BEACON 5 .035 65 KMP TIP (CATHETERS) ×3 IMPLANT
GLIDEWIRE STIFF .35X180X3 HYDR (WIRE) ×3 IMPLANT
GUIDEWIRE ANGLED .035 180CM (WIRE) ×3 IMPLANT
NEEDLE ENTRY 21GA 7CM ECHOTIP (NEEDLE) ×3 IMPLANT
PACK ANGIOGRAPHY (CUSTOM PROCEDURE TRAY) ×3 IMPLANT
SET INTRO CAPELLA COAXIAL (SET/KITS/TRAYS/PACK) ×3 IMPLANT
SHEATH BRITE TIP 6FRX5.5 (SHEATH) ×3 IMPLANT
SUT MNCRL AB 4-0 PS2 18 (SUTURE) ×3 IMPLANT

## 2020-06-05 NOTE — Interval H&P Note (Signed)
History and Physical Interval Note:  06/05/2020 10:51 AM  Alejandro Armstrong  has presented today for surgery, with the diagnosis of LT Upper Extremity graft Declot   ESRD   Pt to have Covid test on 12-17.  The various methods of treatment have been discussed with the patient and family. After consideration of risks, benefits and other options for treatment, the patient has consented to  Procedure(s): PERIPHERAL VASCULAR THROMBECTOMY (Left) as a surgical intervention.  The patient's history has been reviewed, patient examined, no change in status, stable for surgery.  I have reviewed the patient's chart and labs.  Questions were answered to the patient's satisfaction.     Hortencia Pilar

## 2020-06-05 NOTE — Discharge Instructions (Signed)
Moderate Conscious Sedation, Adult, Care After These instructions provide you with information about caring for yourself after your procedure. Your health care provider may also give you more specific instructions. Your treatment has been planned according to current medical practices, but problems sometimes occur. Call your health care provider if you have any problems or questions after your procedure. What can I expect after the procedure? After your procedure, it is common:  To feel sleepy for several hours.  To feel clumsy and have poor balance for several hours.  To have poor judgment for several hours.  To vomit if you eat too soon. Follow these instructions at home: For at least 24 hours after the procedure:   Do not: ? Participate in activities where you could fall or become injured. ? Drive. ? Use heavy machinery. ? Drink alcohol. ? Take sleeping pills or medicines that cause drowsiness. ? Make important decisions or sign legal documents. ? Take care of children on your own.  Rest. Eating and drinking  Follow the diet recommended by your health care provider.  If you vomit: ? Drink water, juice, or soup when you can drink without vomiting. ? Make sure you have little or no nausea before eating solid foods. General instructions  Have a responsible adult stay with you until you are awake and alert.  Take over-the-counter and prescription medicines only as told by your health care provider.  If you smoke, do not smoke without supervision.  Keep all follow-up visits as told by your health care provider. This is important. Contact a health care provider if:  You keep feeling nauseous or you keep vomiting.  You feel light-headed.  You develop a rash.  You have a fever. Get help right away if:  You have trouble breathing. This information is not intended to replace advice given to you by your health care provider. Make sure you discuss any questions you have  with your health care provider. Document Revised: 05/15/2017 Document Reviewed: 09/22/2015 Elsevier Patient Education  2020 Elsevier Inc. Dialysis Fistulogram, Care After This sheet gives you information about how to care for yourself after your procedure. Your health care provider may also give you more specific instructions. If you have problems or questions, contact your health care provider. What can I expect after the procedure? After the procedure, it is common to have:  A small amount of discomfort in the area where the small, thin tube (catheter) was placed for the procedure.  A small amount of bruising around the fistula.  Sleepiness and tiredness (fatigue). Follow these instructions at home: Activity   Rest at home and do not lift anything that is heavier than 5 lb (2.3 kg) on the day after your procedure.  Return to your normal activities as told by your health care provider. Ask your health care provider what activities are safe for you.  Do not drive or use heavy machinery while taking prescription pain medicine.  Do not drive for 24 hours if you were given a medicine to help you relax (sedative) during your procedure. Medicines   Take over-the-counter and prescription medicines only as told by your health care provider. Puncture site care  Follow instructions from your health care provider about how to take care of the site where catheters were inserted. Make sure you: ? Wash your hands with soap and water before you change your bandage (dressing). If soap and water are not available, use hand sanitizer. ? Change your dressing as told by your health   care provider. ? Leave stitches (sutures), skin glue, or adhesive strips in place. These skin closures may need to stay in place for 2 weeks or longer. If adhesive strip edges start to loosen and curl up, you may trim the loose edges. Do not remove adhesive strips completely unless your health care provider tells you to do  that.  Check your puncture area every day for signs of infection. Check for: ? Redness, swelling, or pain. ? Fluid or blood. ? Warmth. ? Pus or a bad smell. General instructions  Do not take baths, swim, or use a hot tub until your health care provider approves. Ask your health care provider if you may take showers. You may only be allowed to take sponge baths.  Monitor your dialysis fistula closely. Check to make sure that you can feel a vibration or buzz (a thrill) when you put your fingers over the fistula.  Prevent damage to your graft or fistula: ? Do not wear tight-fitting clothing or jewelry on the arm or leg that has your graft or fistula. ? Tell all your health care providers that you have a dialysis fistula or graft. ? Do not allow blood draws, IVs, or blood pressure readings to be done in the arm that has your fistula or graft. ? Do not allow flu shots or vaccinations in the arm with your fistula or graft.  Keep all follow-up visits as told by your health care provider. This is important. Contact a health care provider if:  You have redness, swelling, or pain at the site where the catheter was put in.  You have fluid or blood coming from the catheter site.  The catheter site feels warm to the touch.  You have pus or a bad smell coming from the catheter site.  You have a fever or chills. Get help right away if:  You feel weak.  You have trouble balancing.  You have trouble moving your arms or legs.  You have problems with your speech or vision.  You can no longer feel a vibration or buzz when you put your fingers over your dialysis fistula.  The limb that was used for the procedure: ? Swells. ? Is painful. ? Is cold. ? Is discolored, such as blue or pale white.  You have chest pain or shortness of breath. Summary  After a dialysis fistulogram, it is common to have a small amount of discomfort or bruising in the area where the small, thin tube (catheter)  was placed.  Rest at home on the day after your procedure. Return to your normal activities as told by your health care provider.  Take over-the-counter and prescription medicines only as told by your health care provider.  Follow instructions from your health care provider about how to take care of the site where the catheter was inserted.  Keep all follow-up visits as told by your health care provider. This information is not intended to replace advice given to you by your health care provider. Make sure you discuss any questions you have with your health care provider. Document Revised: 07/03/2017 Document Reviewed: 07/03/2017 Elsevier Patient Education  2020 Elsevier Inc.  

## 2020-06-05 NOTE — H&P (Signed)
[facility] Admission History & Physical  MRN : [id number]  VERYL WINEMILLER is a 86 y.o. (August 14, 1929) male who presents with chief complaint of the arm is not working.  History of Present Illness: I am asked to evaluate the patient by the dialysis center. The patient was sent here because they were unable to cannulate the left arm brachial axillary AV graft for the past 6 weeks. Furthermore the Center states there is no thrill or bruit.  He was originally scheduled to undergo fistulogram with intervention in the first week of November but the treatment plan has been complicated with a Covid infection.  At the present time he has a tunneled catheter which has been working adequately.    Patient denies pain or tenderness overlying the access.  There is no pain with dialysis.  The patient denies hand pain or finger pain consistent with steal syndrome.   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  The patient is not chronically hypotensive on dialysis.  Current Facility-Administered Medications  Medication Dose Route Frequency Provider Last Rate Last Admin  . 0.9 %  sodium chloride infusion   Intravenous Continuous Eulogio Ditch E, NP 10 mL/hr at 06/05/20 0930 1,000 mL at 06/05/20 0930  . ceFAZolin (ANCEF) 1-4 GM/50ML-% IVPB           . ceFAZolin (ANCEF) 1-4 GM/50ML-% IVPB           . ceFAZolin (ANCEF) IVPB 1 g/50 mL premix  1 g Intravenous Once Kris Hartmann, NP      . diphenhydrAMINE (BENADRYL) injection 50 mg  50 mg Intravenous Once PRN Kris Hartmann, NP      . famotidine (PEPCID) tablet 40 mg  40 mg Oral Once PRN Kris Hartmann, NP      . methylPREDNISolone sodium succinate (SOLU-MEDROL) 125 mg/2 mL injection 125 mg  125 mg Intravenous Once PRN Kris Hartmann, NP      . midazolam (VERSED) 2 MG/ML syrup 8 mg  8 mg Oral Once PRN Kris Hartmann, NP       Facility-Administered Medications Ordered in Other Encounters  Medication Dose Route Frequency Provider Last Rate Last Admin  . heparin lock flush 100 unit/mL  500 Units Intravenous Once [name], MD      . sodium chloride flush (NS) 0.9 % injection 10 mL  10 mL Intravenous PRN [name], MD      . sodium chloride flush (NS) 0.9 % injection 10 mL  10 mL Intravenous PRN [name], MD   10 mL at 08/16/15 0857    Past Medical History:  Diagnosis Date  . Arthritis   . Dialysis patient [facility])    Mon, Wed Fri  . Hyperlipidemia   . Hypertension   . Hypothyroidism   . Idiopathic thrombocytopenia purpura ([place])   . Kidney stones   . Recurrent UTI   . Renal agenesis    discovered at age 12  . Type 2 diabetes mellitus ([place])   . Ureteral stricture     Past Surgical History:  Procedure Laterality Date  . A/V FISTULAGRAM Left 03/31/2017   Procedure: A/V Fistulagram;  Surgeon: [name], MD;  Location: [place] [facility];  Service: Cardiovascular;  Laterality: Left;  . A/V FISTULAGRAM Left 09/07/2018   Procedure: A/V FISTULAGRAM;  Surgeon: [name],  MD;  Location: [place] [facility];  Service: Cardiovascular;  Laterality: Left;  . A/V SHUNTOGRAM Left 07/28/2017   Procedure: A/V SHUNTOGRAM;  Surgeon: [name], MD;  Location: [place] [facility];  Service: Cardiovascular;  Laterality: Left;  . A/V SHUNTOGRAM Left 03/24/2018   Procedure: A/V SHUNTOGRAM;  Surgeon: [name], MD;  Location: [place] [facility];  Service: Cardiovascular;  Laterality: Left;  . APPENDECTOMY    . AV FISTULA PLACEMENT Left 04/16/2016   Procedure: INSERTION OF ARTERIOVENOUS (AV) GORE-TEX GRAFT ARM;  Surgeon: [name], MD;  Location: [facility];  Service: Vascular;  Laterality: Left;  . CATARACT EXTRACTION W/ INTRAOCULAR LENS IMPLANT Bilateral 2014   done 1-2 months apart at [facility].  [name] FINGER SURGERY Left 2002   pinky finger  .  KIDNEY STONE SURGERY    . KYPHOPLASTY [name] 09/16/2018   Procedure: KYPHOPLASTY L3, DIABETIC;  Surgeon: Hessie Knows, MD;  Location: [facility];  Service: Orthopedics;  Laterality: [name];  . NASAL SINUS SURGERY  1985  . PERIPHERAL VASCULAR CATHETERIZATION  05/13/2016   Procedure: Upper Extremity Angiography;  Surgeon: [name], MD;  Location: [place] [facility];  Service: Cardiovascular;;  . PERIPHERAL VASCULAR CATHETERIZATION  05/13/2016   Procedure: Upper Extremity Intervention;  Surgeon: [name], MD;  Location: [place] [facility];  Service: Cardiovascular;;  . PERIPHERAL VASCULAR THROMBECTOMY [name] 04/16/2020   Procedure: PERIPHERAL VASCULAR THROMBECTOMY, possible permcath placement;  Surgeon: Algernon Huxley, MD;  Location: [place] [facility];  Service: Cardiovascular;  Laterality: [name];  . PICC LINE PLACE PERIPHERAL ([place] HX) Right 08/2015  . PICC LINE REMOVAL ([place] HX) Right 09/2015    Social History Social History   Tobacco Use  . Smoking status: Never Smoker  . Smokeless tobacco: Never Used  Vaping Use  . Vaping Use: Never used  Substance Use Topics  . Alcohol use: No    Alcohol/week: 0.0 standard drinks  . Drug use: No    Family History Family History  Problem Relation Age of Onset  . Cervical cancer Sister   . CAD Father     No family history of bleeding or clotting disorders, autoimmune disease or porphyria  No Known Allergies   REVIEW OF SYSTEMS (Negative unless checked)  Constitutional: [] Weight loss  [] Fever  [] Chills Cardiac: [] Chest pain   [] Chest pressure   [] Palpitations   [] Shortness of breath when laying flat   [] Shortness of breath at rest   [x] Shortness of breath with exertion. Vascular:  [] Pain in legs with walking   [] Pain in legs at rest   [] Pain in legs when laying flat   [] Claudication   [] Pain in feet when walking  [] Pain in feet at rest  [] Pain in feet when laying flat   [] History of DVT   [] Phlebitis   [] Swelling in legs   [] Varicose  veins   [] Non-healing ulcers Pulmonary:   [] Uses home oxygen   [] Productive cough   [] Hemoptysis   [] Wheeze  [] COPD   [] Asthma Neurologic:  [] Dizziness  [] Blackouts   [] Seizures   [] History of stroke   [] History of TIA  [] Aphasia   [] Temporary blindness   [] Dysphagia   [] Weakness or numbness in arms   [] Weakness or numbness in legs Musculoskeletal:  [] Arthritis   [] Joint swelling   [] Joint pain   [] Low back pain Hematologic:  [] Easy bruising  [] Easy bleeding   [] Hypercoagulable state   [] Anemic  [] Hepatitis Gastrointestinal:  [] Blood in stool   [] Vomiting  blood  [] Gastroesophageal reflux/heartburn   [] Difficulty swallowing. Genitourinary:  [x] Chronic kidney disease   [] Difficult urination  [] Frequent urination  [] Burning with urination   [] Blood in urine Skin:  [] Rashes   [] Ulcers   [] Wounds Psychological:  [] History of anxiety   []  History of major depression.  Physical Examination  Vitals:   06/05/20 0919  BP: (!) 184/77  Resp: 11  Temp: 97.7 F (36.5 C)  TempSrc: Oral  SpO2: 97%  Weight: 70.3 kg  Height: 5\' 7"  (1.702 m)   Body mass index is 24.28 kg/m. Gen: WD/WN, NAD Head: [place]/AT, No temporalis wasting. Prominent temp pulse not noted. Ear/Nose/Throat: Hearing grossly intact, nares w/o erythema or drainage, oropharynx w/o Erythema/Exudate,  Eyes: Conjunctiva clear, sclera non-icteric Neck: Trachea midline.  No JVD.  Pulmonary:  Good air movement, respirations not labored, no use of accessory muscles.  Cardiac: RRR, normal S1, S2. Vascular: Left arm brachial axillary AV graft no thrill no bruit; right IJ tunnel catheter clean dry and intact Vessel Right Left  Radial Palpable Palpable  Ulnar Not Palpable Not Palpable  Brachial Palpable Palpable  Carotid Palpable, without bruit Palpable, without bruit  Gastrointestinal: soft, non-tender/non-distended. No guarding/reflex.  Musculoskeletal: M/S 5/5 throughout.  Extremities without ischemic changes.  No deformity or atrophy.   Neurologic: Sensation grossly intact in extremities.  Symmetrical.  Speech is fluent. Motor exam as listed above. Psychiatric: Judgment intact, Mood & affect appropriate for pt's clinical situation. Dermatologic: No rashes or ulcers noted.  No cellulitis or open wounds.    CBC Lab Results  Component Value Date   WBC 9.6 05/23/2020   HGB 9.5 (L) 05/23/2020   HCT 29.2 (L) 05/23/2020   MCV 102.1 (H) 05/23/2020   PLT 163 05/23/2020    BMET    Component Value Date/Time   NA 135 05/23/2020 0613   NA 137 10/15/2012 2113   K 4.4 05/23/2020 0613   K 4.2 10/15/2012 2113   CL 98 05/23/2020 0613   CL 110 (H) 10/15/2012 2113   CO2 26 05/23/2020 0613   CO2 16 (L) 10/15/2012 2113   GLUCOSE 75 05/23/2020 0613   GLUCOSE 142 (H) 10/15/2012 2113   BUN 18 05/23/2020 0613   BUN 51 (H) 10/15/2012 2113   CREATININE 4.93 (H) 05/23/2020 0613   CREATININE 3.24 (H) 10/15/2012 2113   CALCIUM 9.8 05/23/2020 0613   CALCIUM 8.7 10/15/2012 2113   GFRNONAA 11 (L) 05/23/2020 0613   GFRNONAA 17 (L) 10/15/2012 2113   GFRAA 9 (L) 11/04/2019 2028   GFRAA 20 (L) 10/15/2012 2113   Estimated Creatinine Clearance: 9.7 mL/min (A) (by C-G formula based on SCr of 4.93 mg/dL (H)).  COAG Lab Results  Component Value Date   INR 0.9 04/24/2020   INR 1.0 09/07/2018   INR 0.94 04/02/2016    Radiology DG Chest 2 View  Result Date: 05/22/2020 CLINICAL DATA:  Weakness EXAM: CHEST - 2 VIEW COMPARISON:  05/12/2020 FINDINGS: Dialysis catheter on the right with tip at the upper cavoatrial junction. Chronic cardiomegaly. Moderate hiatal hernia. Borderline hyperinflation with diaphragm flattening. Extensive venous stenting on the left, likely dialysis related. There is no edema, consolidation, effusion, or pneumothorax. IMPRESSION: Stable exam.  No evidence of acute disease. Electronically Signed   By: Monte Fantasia M.D.   On: 05/22/2020 11:25   CT Head Wo Contrast  Result Date: 05/22/2020 CLINICAL DATA:  Mental  status changes EXAM: CT HEAD WITHOUT CONTRAST TECHNIQUE: Contiguous axial images were obtained from the base of the skull  through the vertex without intravenous contrast. COMPARISON:  12/28/2006 FINDINGS: Brain: There is atrophy and chronic small vessel disease changes. No acute intracranial abnormality. Specifically, no hemorrhage, hydrocephalus, mass lesion, acute infarction, or significant intracranial injury. Vascular: No hyperdense vessel or unexpected calcification. Skull: No acute calvarial abnormality. Sinuses/Orbits: No acute findings Other: None IMPRESSION: Atrophy, chronic microvascular disease. No acute intracranial abnormality. Electronically Signed   By: Rolm Baptise M.D.   On: 05/22/2020 12:47   DG Chest Portable 1 View  Result Date: 05/12/2020 CLINICAL DATA:  Chest pain following fall EXAM: PORTABLE CHEST 1 VIEW COMPARISON:  April 26, 2020 FINDINGS: Central catheter tip is at the cavoatrial junction. No pneumothorax. There is mild bibasilar atelectasis. Lungs elsewhere clear. Heart is upper normal in size with pulmonary vascularity normal. No adenopathy. There are stents in the left innominate, left axillary, and left brachial regions. No bone lesions. IMPRESSION: Mild bibasilar atelectasis. No edema or airspace opacity. Stable cardiac silhouette. Central catheter tip at cavoatrial junction without pneumothorax. Electronically Signed   By: Lowella Grip III M.D.   On: 05/12/2020 07:54    Assessment/Plan 1.  Complication dialysis device with thrombosis AV access:  Patient's left arm dialysis access is thrombosed. The patient will undergo thrombectomy using interventional techniques.  The risks and benefits were described to the patient.  All questions were answered.  The patient agrees to proceed with angiography and intervention. Potassium will be drawn to ensure that it is an appropriate level prior to performing thrombectomy. 2.  End-stage renal disease requiring hemodialysis:   Patient will continue dialysis therapy without further interruption if a successful thrombectomy is not achieved then the existing catheter will be used for his dialysis access and we will initiate work-up to plan for a new extremity access. Dialysis has already been arranged since the patient missed their previous session 3.  Hypertension:  Patient will continue medical management; nephrology is following no changes in oral medications. 4. Diabetes mellitus:  Glucose will be monitored and oral medications been held this morning once the patient has undergone the patient's procedure po intake will be reinitiated and again Accu-Cheks will be used to assess the blood glucose level and treat as needed. The patient will be restarted on the patient's usual hypoglycemic regime 5.  Coronary artery disease:  EKG will be monitored. Nitrates will be used if needed. The patient's oral cardiac medications will be continued.    Hortencia Pilar, MD  06/05/2020 10:46 AM

## 2020-06-05 NOTE — Op Note (Signed)
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  Ultrasound-guided access to the right brachial vein  2.  Introduction catheter into superior vena cava right arm approach  3.  Right upper extremity venogram  4.  Introduction catheter into left axillary vein from right brachial approach third-order catheter placement  Anesthesia: Conscious sedation was administered under my direct supervision by the interventional radiology RN. IV Versed plus fentanyl were utilized. Continuous ECG, pulse oximetry and blood pressure was monitored throughout the entire procedure. Conscious sedation was administered for a total of 45 minutes and 27 seconds.  Sheath: 6 French Pinnacle sheath right brachial vein  Contrast: 25 cc   Fluoroscopy Time: 1.4 minutes  Indications:  Patient presents for evaluation for possible AV graft placement. The patient has had multiple access disease in both arms and there is prior documentation of central venous stenosis. Venography is being performed to determine whether placement of a AV graft is feasible. The risks and benefits of been reviewed all questions answered patient agrees to proceed.  Procedure:  Alejandro Armstrong a 86 y.o. male who was identified and appropriate procedural time out was performed.  The patient was then placed supine on the table and prepped and draped in the usual sterile fashion.    The left arm was addressed initially.  My plan was to access the occluded left AV graft.  However, after multiple attempts with ultrasound I was unable to get the wire to advance through the scar tissue  associated with the previously placed stents within the graft itself.  This graft is not salvageable and therefore I terminated this portion of the procedure and opted to evaluate the right upper extremity for possible AV graft placement.  Ultrasound was used to evaluate the right brachial vein at the level of the antecubital fossa.  The vein was patent.  A digital ultrasound image was acquired.  A micro-puncture needle was used to access the right brachial vein under direct ultrasound guidance and a permanent image was saved for the record.  The microwire was then advanced under fluoroscopic guidance followed by micro-sheath.  J-wire followed by a 6 French sheath was then placed.  Hand injection of contrast was then performed to demonstrate the venous anatomy of the right upper arm.  The central images were not adequate and therefore a floppy Glidewire was advanced through the 6 French sheath and negotiated first into the subclavian vein. The Glidewire and Kumpe catheter advanced through the innominate stenosis into the superior vena cava where imaging was performed.  The Glidewire was then reintroduced and the wire and Kumpe catheter negotiated into the left venous system through the previous we placed an ottoman subclavian stent and into the axillary vein where imaging of the venous anatomy was obtained.  After review these images catheter was removed over wire and the sheath was pulled and pressure was held.  Findings:    Right upper extremity: Brachial veins are patent throughout the right upper arm.  The axillary vein and subclavian vein distally are widely patent.  At the confluence of the subclavian vein with the innominate vein there is a greater than 90% stricture.  Essentially the existing tunnel catheter occupies the entire space.  The more proximal innominate vein and superior vena cava are patent.   Left upper extremity:   Attempts at accessing the AV graft are unsuccessful secondary to scar  tissue within the graft itself.  This graft is nonsalvageable.  Upon negotiating the Kumpe catheter into the axillary vein on the left side imaging demonstrates the axillary vein is actually occluded the previously placed stent within the subclavian and innominate has tandem greater than 95% stenoses.  There are tremendous number of collaterals existing around the shoulder and coursing across the midline.  There is nonfilling of the IJ on the left.  Summary: Based on the imaging on the left there is very little option for any access there is not even an option for a catheter being moved to the left side.  On the right there is the possibility of a brachial axillary graft however the existing catheter would have to be moved to the femoral position and intervention of the innominate lesion on the right must be performed concomitant with creation of the AV graft.   Disposition: Patient was taken to the recovery room in stable condition having tolerated the procedure well.  Alejandro Armstrong 06/05/2020,12:14 PM

## 2020-06-06 ENCOUNTER — Encounter: Payer: Self-pay | Admitting: Vascular Surgery

## 2020-06-06 LAB — URINE CULTURE: Culture: >=100000 — AB

## 2020-06-07 ENCOUNTER — Encounter: Payer: Self-pay | Admitting: Vascular Surgery

## 2020-06-12 ENCOUNTER — Encounter (INDEPENDENT_AMBULATORY_CARE_PROVIDER_SITE_OTHER): Payer: Medicare HMO | Admitting: Nurse Practitioner

## 2020-06-26 ENCOUNTER — Emergency Department: Payer: Medicare HMO

## 2020-06-26 ENCOUNTER — Inpatient Hospital Stay
Admission: EM | Admit: 2020-06-26 | Discharge: 2020-07-04 | DRG: 689 | Disposition: A | Discharge status: Hospice | Payer: Medicare HMO | Attending: Student | Admitting: Student

## 2020-06-26 ENCOUNTER — Other Ambulatory Visit: Payer: Self-pay

## 2020-06-26 ENCOUNTER — Emergency Department
Admission: EM | Admit: 2020-06-26 | Discharge: 2020-06-26 | Disposition: A | Discharge status: Home / Self Care | Payer: Medicare HMO | Source: Home / Self Care | Attending: Emergency Medicine | Admitting: Emergency Medicine

## 2020-06-26 DIAGNOSIS — Z8701 Personal history of pneumonia (recurrent): Secondary | ICD-10-CM

## 2020-06-26 DIAGNOSIS — D631 Anemia in chronic kidney disease: Secondary | ICD-10-CM | POA: Diagnosis present

## 2020-06-26 DIAGNOSIS — W1830XA Fall on same level, unspecified, initial encounter: Secondary | ICD-10-CM | POA: Insufficient documentation

## 2020-06-26 DIAGNOSIS — E1122 Type 2 diabetes mellitus with diabetic chronic kidney disease: Secondary | ICD-10-CM | POA: Insufficient documentation

## 2020-06-26 DIAGNOSIS — Z992 Dependence on renal dialysis: Secondary | ICD-10-CM

## 2020-06-26 DIAGNOSIS — Z79890 Hormone replacement therapy (postmenopausal): Secondary | ICD-10-CM

## 2020-06-26 DIAGNOSIS — Z9841 Cataract extraction status, right eye: Secondary | ICD-10-CM

## 2020-06-26 DIAGNOSIS — Z79899 Other long term (current) drug therapy: Secondary | ICD-10-CM

## 2020-06-26 DIAGNOSIS — N186 End stage renal disease: Secondary | ICD-10-CM | POA: Diagnosis present

## 2020-06-26 DIAGNOSIS — E1169 Type 2 diabetes mellitus with other specified complication: Secondary | ICD-10-CM | POA: Insufficient documentation

## 2020-06-26 DIAGNOSIS — Z8614 Personal history of Methicillin resistant Staphylococcus aureus infection: Secondary | ICD-10-CM

## 2020-06-26 DIAGNOSIS — I10 Essential (primary) hypertension: Secondary | ICD-10-CM | POA: Diagnosis not present

## 2020-06-26 DIAGNOSIS — R4182 Altered mental status, unspecified: Secondary | ICD-10-CM | POA: Diagnosis present

## 2020-06-26 DIAGNOSIS — E1121 Type 2 diabetes mellitus with diabetic nephropathy: Secondary | ICD-10-CM | POA: Insufficient documentation

## 2020-06-26 DIAGNOSIS — H9190 Unspecified hearing loss, unspecified ear: Secondary | ICD-10-CM | POA: Diagnosis present

## 2020-06-26 DIAGNOSIS — E039 Hypothyroidism, unspecified: Secondary | ICD-10-CM | POA: Insufficient documentation

## 2020-06-26 DIAGNOSIS — R451 Restlessness and agitation: Secondary | ICD-10-CM | POA: Diagnosis not present

## 2020-06-26 DIAGNOSIS — F0390 Unspecified dementia without behavioral disturbance: Secondary | ICD-10-CM | POA: Insufficient documentation

## 2020-06-26 DIAGNOSIS — W19XXXA Unspecified fall, initial encounter: Secondary | ICD-10-CM

## 2020-06-26 DIAGNOSIS — E785 Hyperlipidemia, unspecified: Secondary | ICD-10-CM | POA: Insufficient documentation

## 2020-06-26 DIAGNOSIS — Z1623 Resistance to quinolones and fluoroquinolones: Secondary | ICD-10-CM | POA: Diagnosis present

## 2020-06-26 DIAGNOSIS — Z20822 Contact with and (suspected) exposure to covid-19: Secondary | ICD-10-CM | POA: Diagnosis present

## 2020-06-26 DIAGNOSIS — Q604 Renal hypoplasia, bilateral: Secondary | ICD-10-CM | POA: Insufficient documentation

## 2020-06-26 DIAGNOSIS — Z0000 Encounter for general adult medical examination without abnormal findings: Secondary | ICD-10-CM | POA: Insufficient documentation

## 2020-06-26 DIAGNOSIS — R296 Repeated falls: Secondary | ICD-10-CM | POA: Diagnosis present

## 2020-06-26 DIAGNOSIS — R001 Bradycardia, unspecified: Secondary | ICD-10-CM | POA: Diagnosis not present

## 2020-06-26 DIAGNOSIS — I120 Hypertensive chronic kidney disease with stage 5 chronic kidney disease or end stage renal disease: Secondary | ICD-10-CM | POA: Diagnosis present

## 2020-06-26 DIAGNOSIS — Z9115 Patient's noncompliance with renal dialysis: Secondary | ICD-10-CM

## 2020-06-26 DIAGNOSIS — Z87440 Personal history of urinary (tract) infections: Secondary | ICD-10-CM

## 2020-06-26 DIAGNOSIS — N390 Urinary tract infection, site not specified: Principal | ICD-10-CM | POA: Diagnosis present

## 2020-06-26 DIAGNOSIS — Z66 Do not resuscitate: Secondary | ICD-10-CM | POA: Diagnosis present

## 2020-06-26 DIAGNOSIS — Z9842 Cataract extraction status, left eye: Secondary | ICD-10-CM

## 2020-06-26 DIAGNOSIS — Q602 Renal agenesis, unspecified: Secondary | ICD-10-CM

## 2020-06-26 DIAGNOSIS — Z8616 Personal history of COVID-19: Secondary | ICD-10-CM

## 2020-06-26 DIAGNOSIS — Z87442 Personal history of urinary calculi: Secondary | ICD-10-CM

## 2020-06-26 DIAGNOSIS — D693 Immune thrombocytopenic purpura: Secondary | ICD-10-CM | POA: Diagnosis present

## 2020-06-26 DIAGNOSIS — B965 Pseudomonas (aeruginosa) (mallei) (pseudomallei) as the cause of diseases classified elsewhere: Secondary | ICD-10-CM | POA: Diagnosis present

## 2020-06-26 DIAGNOSIS — F05 Delirium due to known physiological condition: Secondary | ICD-10-CM | POA: Diagnosis not present

## 2020-06-26 DIAGNOSIS — N2581 Secondary hyperparathyroidism of renal origin: Secondary | ICD-10-CM | POA: Diagnosis present

## 2020-06-26 DIAGNOSIS — Z515 Encounter for palliative care: Secondary | ICD-10-CM

## 2020-06-26 DIAGNOSIS — Z961 Presence of intraocular lens: Secondary | ICD-10-CM | POA: Diagnosis present

## 2020-06-26 LAB — URINALYSIS, COMPLETE (UACMP) WITH MICROSCOPIC
Bilirubin Urine: NEGATIVE
Glucose, UA: NEGATIVE mg/dL
Ketones, ur: 5 mg/dL — AB
Nitrite: POSITIVE — AB
Protein, ur: 100 mg/dL — AB
RBC / HPF: >50 RBC/hpf — ABNORMAL HIGH (ref 0–5)
Specific Gravity, Urine: 1.009 (ref 1.005–1.030)
WBC, UA: >50 WBC/hpf — ABNORMAL HIGH (ref 0–5)
pH: 8 (ref 5.0–8.0)

## 2020-06-26 LAB — CBC WITH DIFFERENTIAL/PLATELET
Abs Immature Granulocytes: 0.05 10*3/uL (ref 0.00–0.07)
Basophils Absolute: 0 10*3/uL (ref 0.0–0.1)
Basophils Relative: 0 %
Eosinophils Absolute: 0.1 10*3/uL (ref 0.0–0.5)
Eosinophils Relative: 2 %
HCT: 32.5 % — ABNORMAL LOW (ref 39.0–52.0)
Hemoglobin: 10.8 g/dL — ABNORMAL LOW (ref 13.0–17.0)
Immature Granulocytes: 1 %
Lymphocytes Relative: 21 %
Lymphs Abs: 1.6 10*3/uL (ref 0.7–4.0)
MCH: 33.1 pg (ref 26.0–34.0)
MCHC: 33.2 g/dL (ref 30.0–36.0)
MCV: 99.7 fL (ref 80.0–100.0)
Monocytes Absolute: 0.6 10*3/uL (ref 0.1–1.0)
Monocytes Relative: 8 %
Neutro Abs: 5.3 10*3/uL (ref 1.7–7.7)
Neutrophils Relative %: 68 %
Platelets: 190 10*3/uL (ref 150–400)
RBC: 3.26 MIL/uL — ABNORMAL LOW (ref 4.22–5.81)
RDW: 15 % (ref 11.5–15.5)
WBC: 7.7 10*3/uL (ref 4.0–10.5)
nRBC: 0 % (ref 0.0–0.2)

## 2020-06-26 LAB — BASIC METABOLIC PANEL
Anion gap: 14 (ref 5–15)
BUN: 23 mg/dL (ref 8–23)
CO2: 25 mmol/L (ref 22–32)
Calcium: 9.6 mg/dL (ref 8.9–10.3)
Chloride: 95 mmol/L — ABNORMAL LOW (ref 98–111)
Creatinine, Ser: 5.9 mg/dL — ABNORMAL HIGH (ref 0.61–1.24)
GFR, Estimated: 9 mL/min — ABNORMAL LOW (ref >60)
Glucose, Bld: 89 mg/dL (ref 70–99)
Potassium: 3.9 mmol/L (ref 3.5–5.1)
Sodium: 134 mmol/L — ABNORMAL LOW (ref 135–145)

## 2020-06-26 MED ORDER — LEVOTHYROXINE SODIUM 50 MCG PO TABS: 125.0000 ug | ORAL_TABLET | Freq: Every day | ORAL | Status: DC

## 2020-06-26 MED ORDER — RENA-VITE PO TABS
1.0000 | ORAL_TABLET | Freq: Every day | ORAL | Status: DC
  Administered 2020-06-26 – 2020-07-01 (×6): 1 via ORAL
  Filled 2020-06-26 (×7): qty 1

## 2020-06-26 MED ORDER — SODIUM CHLORIDE 0.9 % IV SOLN
1.0000 g | INTRAVENOUS | Status: DC
  Administered 2020-06-26 – 2020-06-30 (×5): 1 g via INTRAVENOUS
  Filled 2020-06-26 (×4): qty 1
  Filled 2020-06-26: qty 0.33
  Filled 2020-06-26 (×2): qty 1

## 2020-06-26 MED ORDER — ONDANSETRON HCL 4 MG PO TABS: 4.0000 mg | ORAL_TABLET | Freq: Four times a day (QID) | ORAL | Status: DC | PRN

## 2020-06-26 MED ORDER — ACETAMINOPHEN 650 MG RE SUPP: 650.0000 mg | Freq: Four times a day (QID) | RECTAL | Status: DC | PRN

## 2020-06-26 MED ORDER — CALCIUM ACETATE (PHOS BINDER) 667 MG PO CAPS
1334.0000 mg | ORAL_CAPSULE | Freq: Three times a day (TID) | ORAL | Status: DC
  Administered 2020-06-27 – 2020-07-01 (×11): 1334 mg via ORAL
  Filled 2020-06-26 (×12): qty 2

## 2020-06-26 MED ORDER — POLYVINYL ALCOHOL 1.4 % OP SOLN
1.0000 [drp] | Freq: Three times a day (TID) | OPHTHALMIC | Status: DC | PRN
  Filled 2020-06-26: qty 15

## 2020-06-26 MED ORDER — LISINOPRIL 10 MG PO TABS
20.0000 mg | ORAL_TABLET | Freq: Every day | ORAL | Status: DC
  Administered 2020-06-26: 20 mg via ORAL
  Filled 2020-06-26: qty 2

## 2020-06-26 MED ORDER — OMEGA-3-ACID ETHYL ESTERS 1 G PO CAPS
2000.0000 mg | ORAL_CAPSULE | Freq: Two times a day (BID) | ORAL | Status: DC
  Administered 2020-06-27 – 2020-06-29 (×3): 2000 mg via ORAL
  Filled 2020-06-26 (×9): qty 2

## 2020-06-26 MED ORDER — LORAZEPAM 2 MG/ML IJ SOLN
1.0000 mg | Freq: Once | INTRAMUSCULAR | Status: AC
  Administered 2020-06-26: 1 mg via INTRAVENOUS
  Filled 2020-06-26: qty 1

## 2020-06-26 MED ORDER — ACETAMINOPHEN 500 MG PO TABS: 1000.0000 mg | ORAL_TABLET | Freq: Once | ORAL | Status: DC

## 2020-06-26 MED ORDER — ATORVASTATIN CALCIUM 10 MG PO TABS
10.0000 mg | ORAL_TABLET | Freq: Every day | ORAL | Status: DC
  Administered 2020-06-26 – 2020-06-30 (×4): 10 mg via ORAL
  Filled 2020-06-26 (×6): qty 1

## 2020-06-26 MED ORDER — SODIUM CHLORIDE 0.9 % IV SOLN: 250.0000 mL | INTRAVENOUS | Status: DC | PRN

## 2020-06-26 MED ORDER — LISINOPRIL 20 MG PO TABS
20.0000 mg | ORAL_TABLET | Freq: Every day | ORAL | Status: DC
  Administered 2020-06-27 – 2020-07-01 (×5): 20 mg via ORAL
  Filled 2020-06-26 (×6): qty 1

## 2020-06-26 MED ORDER — HALOPERIDOL LACTATE 5 MG/ML IJ SOLN: 2.0000 mg | Freq: Once | INTRAMUSCULAR | Status: DC

## 2020-06-26 MED ORDER — HEPARIN SODIUM (PORCINE) 5000 UNIT/ML IJ SOLN
5000.0000 [IU] | Freq: Three times a day (TID) | INTRAMUSCULAR | Status: DC
  Administered 2020-06-26 – 2020-07-02 (×11): 5000 [IU] via SUBCUTANEOUS
  Filled 2020-06-26 (×13): qty 1

## 2020-06-26 MED ORDER — LIDOCAINE-PRILOCAINE 2.5-2.5 % EX CREA
1.0000 "application " | TOPICAL_CREAM | Freq: Every day | CUTANEOUS | Status: DC | PRN
  Filled 2020-06-26: qty 5

## 2020-06-26 MED ORDER — ALBUTEROL SULFATE HFA 108 (90 BASE) MCG/ACT IN AERS
2.0000 | INHALATION_SPRAY | Freq: Four times a day (QID) | RESPIRATORY_TRACT | Status: DC
  Filled 2020-06-26: qty 6.7

## 2020-06-26 MED ORDER — METOPROLOL TARTRATE 25 MG PO TABS
25.0000 mg | ORAL_TABLET | Freq: Two times a day (BID) | ORAL | Status: DC
  Administered 2020-06-26: 25 mg via ORAL
  Filled 2020-06-26: qty 1

## 2020-06-26 MED ORDER — LEVOTHYROXINE SODIUM 125 MCG PO TABS
125.0000 ug | ORAL_TABLET | Freq: Every day | ORAL | Status: DC
  Administered 2020-06-27: 125 ug via ORAL
  Filled 2020-06-26 (×2): qty 1

## 2020-06-26 MED ORDER — ACETAMINOPHEN 325 MG PO TABS
650.0000 mg | ORAL_TABLET | Freq: Four times a day (QID) | ORAL | Status: DC | PRN
  Administered 2020-06-30: 650 mg via ORAL
  Filled 2020-06-26: qty 2

## 2020-06-26 MED ORDER — HYDRALAZINE HCL 25 MG PO TABS
25.0000 mg | ORAL_TABLET | Freq: Two times a day (BID) | ORAL | Status: DC
  Administered 2020-06-26 – 2020-07-01 (×8): 25 mg via ORAL
  Filled 2020-06-26 (×12): qty 1

## 2020-06-26 MED ORDER — VITAMIN D (ERGOCALCIFEROL) 1.25 MG (50000 UNIT) PO CAPS
50000.0000 [IU] | ORAL_CAPSULE | ORAL | Status: DC
  Filled 2020-06-26: qty 1

## 2020-06-26 MED ORDER — ONDANSETRON HCL 4 MG/2ML IJ SOLN: 4.0000 mg | Freq: Four times a day (QID) | INTRAMUSCULAR | Status: DC | PRN

## 2020-06-26 MED ORDER — ALBUTEROL SULFATE HFA 108 (90 BASE) MCG/ACT IN AERS
2.0000 | INHALATION_SPRAY | Freq: Four times a day (QID) | RESPIRATORY_TRACT | Status: DC | PRN
  Administered 2020-06-30: 2 via RESPIRATORY_TRACT
  Filled 2020-06-26: qty 6.7

## 2020-06-26 MED ORDER — METOPROLOL TARTRATE 25 MG PO TABS
25.0000 mg | ORAL_TABLET | Freq: Two times a day (BID) | ORAL | Status: DC
  Administered 2020-06-27: 25 mg via ORAL
  Filled 2020-06-26: qty 1

## 2020-06-26 NOTE — Progress Notes (Signed)
Pharmacy Antibiotic Note  Alejandro Armstrong is a 87 y.o. male admitted on 06/26/2020 with UTI.  Pharmacy has been consulted for Cefepime dosing.  Pt is on MWF dialysis.  Plan: Ordered Cefepime 1 gm q24hr based on indication and HD status.   Temp (24hrs), Avg:98.8 F (37.1 C), Min:98.7 F (37.1 C), Max:99 F (37.2 C)  Recent Labs  Lab 06/26/20 1544  WBC 7.7  CREATININE 5.90*    Estimated Creatinine Clearance: 8.1 mL/min (A) (by C-G formula based on SCr of 5.9 mg/dL (H)).    No Known Allergies  Antimicrobials this admission: 01/11 Cefepime >>   Microbiology results: 01/11 UCx: Pending   Thank you for allowing pharmacy to be a part of this patient's care.  Renda Rolls, PharmD, [facility] 06/26/2020 10:28 PM

## 2020-06-26 NOTE — H&P (Signed)
History and Physical    MARCELINO CAMPOS DVV:[id number] DOB: 15-May-1930 DOA: 06/26/2020  PCP: Casilda Carls, MD   Patient coming from: [facility]   Chief Complaint: Recurrent falls, General weakness  HPI: NYAN DUFRESNE is a 87 y.o. male with medical history significant for dementia, ESRD on hemodialysis, hypertension, and hypothyroidism, presenting to the emergency department for evaluation of generalized weakness and recurrent falls.  History is primarily obtained from the patient's wife who reports that the patient has been increasingly weak in general, fell twice today which is unusual for him, and she is unable to care for him at their retirement community.  Patient had similar symptoms last month for which she was admitted to the hospital, diagnosed with UTI, discharged to SNF, and had improved.  He has now developed recurrent general weakness, fell this morning, was evaluated in the emergency department and not found to have any significant injury or obvious indication for hospital admission at that time, but went to see an outpatient physician where he fell again.    ED Course: Upon arrival to the ED, patient is found to be afebrile, saturating well on room air, and with stable blood pressure.  EKG features sinus bradycardia.  Noncontrast head CT negative for acute intracranial abnormality and cervical spine CT negative for acute osseous abnormality.  Chest x-ray negative for acute findings and plain films of the left shoulder and pelvis are negative for acute fractures.  Chemistry panel with slight hyponatremia, normal bicarbonate, normal potassium, and normal BUN.  CBC features a stable normocytic anemia.  Urine is turbid with large leukocytes and positive nitrite on dipstick.  Urine culture was ordered. Patient was treated with 1 mg IV Ativan.   Review of Systems:  Unable to complete ROS secondary to the patient's clinical condition.  Past Medical History:   Diagnosis Date  . Arthritis   . Dialysis patient [facility])    Mon, Wed Fri  . Hyperlipidemia   . Hypertension   . Hypothyroidism   . Idiopathic thrombocytopenia purpura ([place])   . Kidney stones   . Recurrent UTI   . Renal agenesis    discovered at age 58  . Type 2 diabetes mellitus ([place])   . Ureteral stricture     Past Surgical History:  Procedure Laterality Date  . A/V FISTULAGRAM Left 03/31/2017   Procedure: A/V Fistulagram;  Surgeon: [name], MD;  Location: [place] [facility];  Service: Cardiovascular;  Laterality: Left;  . A/V FISTULAGRAM Left 09/07/2018   Procedure: A/V FISTULAGRAM;  Surgeon: [name], MD;  Location: [place];  Service: Cardiovascular;  Laterality: Left;  . A/V SHUNTOGRAM Left 07/28/2017   Procedure: A/V SHUNTOGRAM;  Surgeon: [name], MD;  Location: [place] [facility];  Service: Cardiovascular;  Laterality: Left;  . A/V SHUNTOGRAM Left 03/24/2018   Procedure: A/V SHUNTOGRAM;  Surgeon: [name], MD;  Location: [place] [facility];  Service: Cardiovascular;  Laterality: Left;  . APPENDECTOMY    . AV FISTULA PLACEMENT Left 04/16/2016   Procedure: INSERTION OF ARTERIOVENOUS (AV) GORE-TEX GRAFT ARM;  Surgeon: [name], MD;  Location: [facility] OR[facility];  Service: Vascular;  Laterality: Left;  . CATARACT EXTRACTION W/ INTRAOCULAR LENS IMPLANT Bilateral 2014   done 1-2 months apart at [facility].  [name] FINGER SURGERY Left 2002   pinky finger  . KIDNEY STONE SURGERY    . KYPHOPLASTY [name] 09/16/2018   Procedure: KYPHOPLASTY L3, DIABETIC;  Surgeon: Rudene Christians,  Legrand Como, MD;  Location: [facility] ORS;  Service: Orthopedics;  Laterality: [name];  . NASAL SINUS SURGERY  1985  . PERIPHERAL VASCULAR CATHETERIZATION  05/13/2016   Procedure: Upper Extremity Angiography;  Surgeon: [name], MD;  Location: [place] [facility];  Service: Cardiovascular;;  . PERIPHERAL VASCULAR CATHETERIZATION  05/13/2016   Procedure: Upper Extremity  Intervention;  Surgeon: [name], MD;  Location: [place] [facility];  Service: Cardiovascular;;  . PERIPHERAL VASCULAR THROMBECTOMY [name] 04/16/2020   Procedure: PERIPHERAL VASCULAR THROMBECTOMY, possible permcath placement;  Surgeon: Algernon Huxley, MD;  Location: [place] [facility];  Service: Cardiovascular;  Laterality: [name];  . PERIPHERAL VASCULAR THROMBECTOMY Left 06/05/2020   Procedure: PERIPHERAL VASCULAR THROMBECTOMY;  Surgeon: [name], MD;  Location: [place] [facility];  Service: Cardiovascular;  Laterality: Left;  . PICC LINE PLACE PERIPHERAL ([place] HX) Right 08/2015  . PICC LINE REMOVAL ([place] HX) Right 09/2015    Social History:   reports that he has never smoked. He has never used smokeless tobacco. He reports that he does not drink alcohol and does not use drugs.  No Known Allergies  Family History  Problem Relation Age of Onset  . Cervical cancer Sister   . CAD Father      Prior to Admission medications   Medication Sig Start Date End Date Taking? Authorizing Provider  acetaminophen (TYLENOL) 500 MG tablet Take 1,000 mg by mouth every 6 (six) hours as needed for moderate pain or headache.    Yes [provider]  albuterol (VENTOLIN HFA) 108 (90 Base) MCG/ACT inhaler Inhale 2 puffs into the lungs every 6 (six) hours. 04/19/20  Yes Sharen Hones, MD  atorvastatin (LIPITOR) 10 MG tablet Take 10 mg by mouth at bedtime.   Yes [provider]  B Complex-C-Folic Acid (RENA-VITE RX) 1 MG TABS Take 1 tablet by mouth daily. 04/16/20  Yes [provider]  calcium acetate (PHOSLO) 667 MG capsule Take 2 capsules (1,334 mg total) by mouth 3 (three) times daily with meals. 04/19/20  Yes Sharen Hones, MD  Carboxymethylcellulose Sodium 0.25 % SOLN Place 1-2 drops into both eyes 3 (three) times daily as needed (for dry eyes).   Yes [provider]  Cholecalciferol (VITAMIN D3) 1.25 MG (50000 UT) CAPS Take 50,000 Units by mouth every  Wednesday.    Yes [provider]  hydrALAZINE (APRESOLINE) 25 MG tablet Take 25 mg by mouth 2 (two) times daily.   Yes [provider]  levothyroxine (SYNTHROID) 125 MCG tablet Take 125 mcg by mouth daily before breakfast.    Yes [provider]  lidocaine-prilocaine (EMLA) cream Apply 1 application topically daily as needed (port access).  10/03/16  Yes [provider]  lisinopril (PRINIVIL,ZESTRIL) 20 MG tablet Take 20 mg by mouth daily.  06/24/12  Yes [provider]  metoprolol tartrate (LOPRESSOR) 25 MG tablet Take 0.5 tablets (12.5 mg total) by mouth 2 (two) times daily. Patient taking differently: Take 25 mg by mouth 2 (two) times daily. 04/19/20  Yes Sharen Hones, MD  Omega-3 1000 MG CAPS Take 2,000 mg by mouth 2 (two) times daily.    Yes [provider]    Physical Exam: Vitals:   06/26/20 2131 06/26/20 2132 06/26/20 2134 06/26/20 2137  BP: (!) 144/80 (!) 144/80 (!) 144/80 (!) 144/80  Pulse: (!) 58   (!) 58  Resp:    18  Temp:      TempSrc:      SpO2:  95%    Constitutional: NAD, calm  Eyes: PERTLA, lids and conjunctivae normal ENMT: Mucous membranes are moist. Posterior pharynx clear of any exudate or lesions.   Neck: normal, supple, no masses, no thyromegaly Respiratory: no wheezing, no crackles. No accessory muscle use.  Cardiovascular: S1 & S2 heard, regular rate and rhythm. No extremity edema.   Abdomen: No distension, no tenderness, soft. Bowel sounds active.  Musculoskeletal: no clubbing / cyanosis. No joint deformity upper and lower extremities.   Skin: no significant rashes, lesions, ulcers. Warm, dry, well-perfused. Xerosis.  Neurologic: CN 2-12 grossly intact. Sensation intact. Moving all extremities.  Psychiatric: Sleeping, wakes briefly.     Labs and Imaging on Admission: I have personally reviewed following labs and imaging studies  CBC: Recent Labs  Lab 06/26/20 1544  WBC 7.7  NEUTROABS 5.3  HGB  10.8*  HCT 32.5*  MCV 99.7  PLT 841   Basic Metabolic Panel: Recent Labs  Lab 06/26/20 1544  NA 134*  K 3.9  CL 95*  CO2 25  GLUCOSE 89  BUN 23  CREATININE 5.90*  CALCIUM 9.6   GFR: Estimated Creatinine Clearance: 8.1 mL/min (A) (by C-G formula based on SCr of 5.9 mg/dL (H)). Liver Function Tests: No results for input(s): AST, ALT, ALKPHOS, BILITOT, PROT, ALBUMIN in the last 168 hours. No results for input(s): LIPASE, AMYLASE in the last 168 hours. No results for input(s): AMMONIA in the last 168 hours. Coagulation Profile: No results for input(s): INR, PROTIME in the last 168 hours. Cardiac Enzymes: No results for input(s): CKTOTAL, CKMB, CKMBINDEX, TROPONINI in the last 168 hours. BNP (last 3 results) No results for input(s): PROBNP in the last 8760 hours. HbA1C: No results for input(s): HGBA1C in the last 72 hours. CBG: No results for input(s): GLUCAP in the last 168 hours. Lipid Profile: No results for input(s): CHOL, HDL, LDLCALC, TRIG, CHOLHDL, LDLDIRECT in the last 72 hours. Thyroid Function Tests: No results for input(s): TSH, T4TOTAL, FREET4, T3FREE, THYROIDAB in the last 72 hours. Anemia Panel: No results for input(s): VITAMINB12, FOLATE, FERRITIN, TIBC, IRON, RETICCTPCT in the last 72 hours. Urine analysis:    Component Value Date/Time   COLORURINE YELLOW (A) 06/26/2020 1653   APPEARANCEUR TURBID (A) 06/26/2020 1653   APPEARANCEUR Cloudy 10/15/2012 2114   LABSPEC 1.009 06/26/2020 1653   LABSPEC 1.014 10/15/2012 2114   PHURINE 8.0 06/26/2020 1653   GLUCOSEU NEGATIVE 06/26/2020 1653   GLUCOSEU Negative 10/15/2012 2114   HGBUR SMALL (A) 06/26/2020 1653   BILIRUBINUR NEGATIVE 06/26/2020 1653   BILIRUBINUR Negative 10/15/2012 2114   KETONESUR 5 (A) 06/26/2020 1653   PROTEINUR 100 (A) 06/26/2020 1653   NITRITE POSITIVE (A) 06/26/2020 1653   LEUKOCYTESUR LARGE (A) 06/26/2020 1653   LEUKOCYTESUR 3+ 10/15/2012 2114   Sepsis  Labs: @LABRCNTIP (procalcitonin:4,lacticidven:4) )No results found for this or any previous visit (from the past 240 hour(s)).   Radiological Exams on Admission: DG Chest 2 View  Result Date: 06/26/2020 CLINICAL DATA:  Fall. EXAM: CHEST - 2 VIEW COMPARISON:  Chest x-ray 05/22/2020. FINDINGS: Dual-lumen catheter stable position. Heart size stable. Low lung volumes. No focal infiltrate. No pleural effusion or pneumothorax. Left subclavian and upper extremity stents in stable position. IMPRESSION: 1. Dual-lumen catheter in stable position. 2. Low lung volumes with mild bibasilar atelectasis. No acute cardiopulmonary disease. Electronically Signed   By: Marcello Moores  Register   On: 06/26/2020 09:57   CT Head Wo Contrast  Result Date: 06/26/2020 CLINICAL DATA:  Fall EXAM: CT HEAD WITHOUT  CONTRAST CT CERVICAL SPINE WITHOUT CONTRAST TECHNIQUE: Multidetector CT imaging of the head and cervical spine was performed following the standard protocol without intravenous contrast. Multiplanar CT image reconstructions of the cervical spine were also generated. COMPARISON:  CT brain and cervical spine 06/26/2020, 05/22/2020 FINDINGS: CT HEAD FINDINGS Brain: No acute territorial infarction, hemorrhage, or intracranial mass. Moderate atrophy. Moderate hypodensity in the white matter consistent with chronic small vessel ischemic change. Stable ventricle size. Vascular: No hyperdense vessels.  Carotid vascular calcification Skull: Normal. Negative for fracture or focal lesion. Sinuses/Orbits: Mild frothy debris in the right ethmoid sinus. Other: None CT CERVICAL SPINE FINDINGS Alignment: No subluxation.  Facet alignment within normal limits. Skull base and vertebrae: No acute fracture. No primary bone lesion or focal pathologic process. Soft tissues and spinal canal: No prevertebral fluid or swelling. No visible canal hematoma. Disc levels: Mild multiple level degenerative change. Mild facet degenerative changes at multiple levels.  Upper chest: Negative. Other: Partially visualized left subclavian stent. IMPRESSION: 1. No CT evidence for acute intracranial abnormality. Atrophy and chronic small vessel ischemic change of the white matter. 2. Degenerative changes of the cervical spine. No acute osseous abnormality. Electronically Signed   By: Donavan Foil M.D.   On: 06/26/2020 17:32   CT Head Wo Contrast  Result Date: 06/26/2020 CLINICAL DATA:  87 year old male status post fall, found down. EXAM: CT HEAD WITHOUT CONTRAST TECHNIQUE: Contiguous axial images were obtained from the base of the skull through the vertex without intravenous contrast. COMPARISON:  Head CT 05/22/2020 and earlier. FINDINGS: Brain: Cerebral volume is stable from last month. Stable gray-white matter differentiation throughout the brain. Patchy and confluent bilateral white matter hypodensity. No midline shift, ventriculomegaly, mass effect, evidence of mass lesion, intracranial hemorrhage or evidence of cortically based acute infarction. Vascular: Calcified atherosclerosis at the skull base. No suspicious intracranial vascular hyperdensity. Skull: No acute osseous abnormality identified. Sinuses/Orbits: Trace bubbly opacity posterior right ethmoid air cell, otherwise Visualized paranasal sinuses and mastoids are clear. Other: No orbit or scalp soft tissue injury identified. Scalp vessel calcified atherosclerosis. IMPRESSION: Stable head CT. No acute intracranial abnormality or acute traumatic injury identified. Electronically Signed   By: Genevie Ann M.D.   On: 06/26/2020 10:29   CT Cervical Spine Wo Contrast  Result Date: 06/26/2020 CLINICAL DATA:  Fall EXAM: CT HEAD WITHOUT CONTRAST CT CERVICAL SPINE WITHOUT CONTRAST TECHNIQUE: Multidetector CT imaging of the head and cervical spine was performed following the standard protocol without intravenous contrast. Multiplanar CT image reconstructions of the cervical spine were also generated. COMPARISON:  CT brain and  cervical spine 06/26/2020, 05/22/2020 FINDINGS: CT HEAD FINDINGS Brain: No acute territorial infarction, hemorrhage, or intracranial mass. Moderate atrophy. Moderate hypodensity in the white matter consistent with chronic small vessel ischemic change. Stable ventricle size. Vascular: No hyperdense vessels.  Carotid vascular calcification Skull: Normal. Negative for fracture or focal lesion. Sinuses/Orbits: Mild frothy debris in the right ethmoid sinus. Other: None CT CERVICAL SPINE FINDINGS Alignment: No subluxation.  Facet alignment within normal limits. Skull base and vertebrae: No acute fracture. No primary bone lesion or focal pathologic process. Soft tissues and spinal canal: No prevertebral fluid or swelling. No visible canal hematoma. Disc levels: Mild multiple level degenerative change. Mild facet degenerative changes at multiple levels. Upper chest: Negative. Other: Partially visualized left subclavian stent. IMPRESSION: 1. No CT evidence for acute intracranial abnormality. Atrophy and chronic small vessel ischemic change of the white matter. 2. Degenerative changes of the cervical spine. No acute osseous abnormality.  Electronically Signed   By: Donavan Foil M.D.   On: 06/26/2020 17:32   CT Cervical Spine Wo Contrast  Result Date: 06/26/2020 CLINICAL DATA:  87 year old male status post fall, found down. EXAM: CT CERVICAL SPINE WITHOUT CONTRAST TECHNIQUE: Multidetector CT imaging of the cervical spine was performed without intravenous contrast. Multiplanar CT image reconstructions were also generated. COMPARISON:  None. FINDINGS: Alignment: Preserved cervical lordosis. Cervicothoracic junction alignment is within normal limits. Bilateral posterior element alignment is within normal limits. Skull base and vertebrae: Osteopenia. Visualized skull base is intact. No atlanto-occipital dissociation. C1 and C2 appear intact and normally aligned. Degenerative subchondral cysts at the base of the odontoid. No  acute osseous abnormality identified. Soft tissues and spinal canal: No prevertebral fluid or swelling. No visible canal hematoma. Right lower neck dual lumen central line partially visible. Mild cervical carotid calcified atherosclerosis. Disc levels:  Mild for age cervical spine degeneration. Upper chest: Osteopenia. Grossly intact visible upper thoracic levels. Negative lung apices. Partially visible left subclavian vascular stent. IMPRESSION: No acute traumatic injury identified in the cervical spine. Electronically Signed   By: Genevie Ann M.D.   On: 06/26/2020 10:34   DG Pelvis Portable  Result Date: 06/26/2020 CLINICAL DATA:  Pain following fall EXAM: PORTABLE PELVIS 1-2 VIEWS COMPARISON:  April 24, 2015 FINDINGS: There is no evidence of pelvic fracture or dislocation. There is mild symmetric narrowing of each hip joint. No erosive change. Evidence of previous kyphoplasty at L3. IMPRESSION: No acute fracture or dislocation. Mild symmetric narrowing each hip joint. Previous kyphoplasty at L3 noted. Electronically Signed   By: Lowella Grip III M.D.   On: 06/26/2020 11:11   DG Shoulder Left  Result Date: 06/26/2020 CLINICAL DATA:  Fall EXAM: LEFT SHOULDER - 2+ VIEW COMPARISON:  None. FINDINGS: Normal alignment with approximation of the joints. No fracture or focal osseous lesion. Osteopenia. Mild acromioclavicular osteoarthrosis. Soft tissues are within normal limits. Upper extremity vascular stent grafts. IMPRESSION: No acute osseous abnormality. Mild acromioclavicular osteoarthrosis. Electronically Signed   By: Primitivo Gauze M.D.   On: 06/26/2020 09:59    EKG: Independently reviewed. Sinus bradycardia, rate 56.   Assessment/Plan   1. Recurrent falls, general weakness  - Presents with general weakness and recurrent falls  - Imaging negative for acute fractures or acute intracranial abnormality  - Exam difficult due to sedation with IV Ativan  - He had similar presentation last month  and improved with treatment of UTI  - Check TSH and CK, send urine for culture and treat suspected UTI, consult PT     2. UTI  - Presents with general weakness and recurrent falls, had similar presentation last month when he reportedly improved with treatment of UTI, is found to have turbid urine with positive leukocytes and nitrites on dipstick, and is not septic  - Recent culture grew Pseudomonas  - Send urine for culture, start cefepime    3. ESRD  - No indication for urgent HD on admission  - Consult nephrology for maintenance HD if not discharged tomorrow    4. Hypertension  - Continue lisinopril, hydralazine, and Lopressor as tolerated   5. Hypothyroidism  - Check TSH given his presentation, continue Synthroid    6. Dementia  - Continue supportive care, delirium precautions    7. Anemia  - Hgb is 10.8 on admission  - Appears stable, no bleeding, likely related to CKD     DVT prophylaxis: sq heparin  Code Status: DNR  Family Communication: Wife updated  from ED  Disposition Plan:  Patient is from: [facility]  Anticipated d/c is to: SNF  Anticipated d/c date is: 06/27/20 Patient currently: Pending therapy assessment  Consults called: None  Admission status: Observation     Vianne Bulls, MD [facility]  06/26/2020, 9:44 PM

## 2020-06-26 NOTE — ED Notes (Signed)
Spoke with patient's spouse Willia Lampert) to verify home medications. Spouse was anxious to ensure patient received dialysis on Wednesday (1/12) as that would be his next outpatient session.

## 2020-06-26 NOTE — ED Notes (Signed)
Patient resting comfortably at this time. No signs of distress. Will continue to monitor.

## 2020-06-26 NOTE — ED Notes (Signed)
Patient transported to CT 

## 2020-06-26 NOTE — ED Notes (Signed)
Pt has dementia. Unable to sign for discharge.

## 2020-06-26 NOTE — ED Provider Notes (Addendum)
[facility] Emergency Department Provider Note  ____________________________________________   Event Date/Time   First MD Initiated Contact with Patient 06/26/20 [phone]     (approximate)  I have reviewed the triage vital signs and the nursing notes.   HISTORY  Chief Complaint Fall   HPI Alejandro Armstrong is a 87 y.o. male with a past medical history of arthritis, HTN, HDL, hypothyroidism, ITP, DM and ureteral stricture who presents via EMS from [place] facility for assessment after arrival mechanical fall. Attempted to reach staff caring for patient at his facility but was unable to reach anyone who provide any additional history. His wife noted that he did have a history of dementia and is not typically oriented but was concerned that he may have fallen although she did not see him fall. Patient denies any acute pain or recent falls but further history is limited secondary dementia.          Past Medical History:  Diagnosis Date  . Arthritis   . Dialysis patient [facility])    Mon, Wed Fri  . Hyperlipidemia   . Hypertension   . Hypothyroidism   . Idiopathic thrombocytopenia purpura ([place])   . Kidney stones   . Recurrent UTI   . Renal agenesis    discovered at age 21  . Type 2 diabetes mellitus ([place])   . Ureteral stricture     Patient Active Problem List   Diagnosis Date Noted  . Volume overload 05/22/2020  . AV fistula thrombosis ([place]) 04/24/2020  . Leukocytosis 04/24/2020  . Anemia in ESRD (end-stage renal disease) ([place]) 04/24/2020  . Acute hypoxemic respiratory failure ([place]) 04/18/2020  . Pneumonia due to COVID-19 virus   . Generalized weakness   . Recurrent UTI 02/16/2020  . Chest pain 11/04/2019  . Complication of vascular access for dialysis 11/20/2017  . Hyperlipidemia 03/26/2017  . ESRD (end stage renal disease) on dialysis ([place]) 03/26/2017  . Postop check 04/30/2016  . Fever chills 09/18/2015  . Bacteremia 09/18/2015  .  Acute on chronic renal insufficiency 09/01/2015  . Hyperkalemia 09/01/2015  . MRSA bacteremia secondary to PICC line infection 09/01/2015  . Type II diabetes mellitus with renal manifestations ([place]) 09/01/2015  . UTI (lower urinary tract infection) 08/06/2015  . Idiopathic thrombocytopenic purpura ([place]) 08/02/2015  . B12 deficiency 07/24/2015  . Pseudomonas infection 04/28/2014  . 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 Urinary urgency 02/21/2013  . Hypertrophy of prostate with urinary obstruction and other lower urinary tract symptoms (LUTS) 11/18/2012  . Calculus of kidney 03/12/2012  . Increased frequency of urination 03/12/2012  . Nocturia 03/12/2012  . 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 Urethral stricture 03/12/2012  . Urinary tract infection 03/12/2012  . Essential hypertension 03/31/2011  . Hypothyroidism 03/31/2011  . Chronic kidney disease, stage IV (severe) ([place]) 03/31/2011    Past Surgical History:  Procedure Laterality Date  . A/V FISTULAGRAM Left 03/31/2017   Procedure: A/V Fistulagram;  Surgeon: [name], MD;  Location: [place] [facility];  Service: Cardiovascular;  Laterality: Left;  . A/V FISTULAGRAM Left 09/07/2018   Procedure: A/V FISTULAGRAM;  Surgeon: [name], MD;  Location: [place] [facility];  Service: Cardiovascular;  Laterality: Left;  . A/V SHUNTOGRAM Left 07/28/2017   Procedure: A/V SHUNTOGRAM;  Surgeon: [name], MD;  Location: [place] [facility];  Service: Cardiovascular;  Laterality: Left;  . A/V SHUNTOGRAM Left 03/24/2018   Procedure: A/V SHUNTOGRAM;  Surgeon: Hortencia Pilar  G, MD;  Location: [place] [facility];  Service: Cardiovascular;  Laterality: Left;  . APPENDECTOMY    . AV FISTULA PLACEMENT Left 04/16/2016   Procedure: INSERTION OF ARTERIOVENOUS (AV) GORE-TEX GRAFT ARM;  Surgeon: [name], MD;  Location: [facility];  Service: Vascular;  Laterality: Left;  . CATARACT EXTRACTION W/  INTRAOCULAR LENS IMPLANT Bilateral 2014   done 1-2 months apart at [facility].  [name] FINGER SURGERY Left 2002   pinky finger  . KIDNEY STONE SURGERY    . KYPHOPLASTY [name] 09/16/2018   Procedure: KYPHOPLASTY L3, DIABETIC;  Surgeon: Hessie Knows, MD;  Location: [facility] OR[facility];  Service: Orthopedics;  Laterality: [name];  . NASAL SINUS SURGERY  1985  . PERIPHERAL VASCULAR CATHETERIZATION  05/13/2016   Procedure: Upper Extremity Angiography;  Surgeon: [name], MD;  Location: [place] [facility];  Service: Cardiovascular;;  . PERIPHERAL VASCULAR CATHETERIZATION  05/13/2016   Procedure: Upper Extremity Intervention;  Surgeon: [name], MD;  Location: [place] [facility];  Service: Cardiovascular;;  . PERIPHERAL VASCULAR THROMBECTOMY [name] 04/16/2020   Procedure: PERIPHERAL VASCULAR THROMBECTOMY, possible permcath placement;  Surgeon: Algernon Huxley, MD;  Location: [place] [facility];  Service: Cardiovascular;  Laterality: [name];  . PERIPHERAL VASCULAR THROMBECTOMY Left 06/05/2020   Procedure: PERIPHERAL VASCULAR THROMBECTOMY;  Surgeon: [name], MD;  Location: [place] [facility];  Service: Cardiovascular;  Laterality: Left;  . PICC LINE PLACE PERIPHERAL ([place] HX) Right 08/2015  . PICC LINE REMOVAL ([place] HX) Right 09/2015    Prior to Admission medications   Medication Sig Start Date End Date Taking? Authorizing Provider  acetaminophen (TYLENOL) 500 MG tablet Take 1,000 mg by mouth every 6 (six) hours as needed for moderate pain or headache.     [provider]  albuterol (VENTOLIN HFA) 108 (90 Base) MCG/ACT inhaler Inhale 2 puffs into the lungs every 6 (six) hours. 04/19/20   Sharen Hones, MD  atorvastatin (LIPITOR) 10 MG tablet Take 10 mg by mouth daily.    [provider]  B Complex-C-Folic Acid (RENA-VITE RX) 1 MG TABS Take 1 tablet by mouth daily. 04/16/20   [provider]  calcium acetate (PHOSLO) 667 MG capsule Take 2 capsules (1,334 mg total) by mouth 3  (three) times daily with meals. 04/19/20   Sharen Hones, MD  Carboxymethylcellulose Sodium (THERATEARS) 0.25 % SOLN Place 1-2 drops into both eyes 3 (three) times daily as needed (for dry eyes).    [provider]  Cholecalciferol (VITAMIN D3) 1.25 MG (50000 UT) CAPS Take 50,000 Units by mouth every Wednesday.     [provider]  hydrALAZINE (APRESOLINE) 25 MG tablet Take 25 mg by mouth 2 (two) times daily.    [provider]  levothyroxine (SYNTHROID) 125 MCG tablet Take 125 mcg by mouth daily before breakfast.     [provider]  lidocaine-prilocaine (EMLA) cream Apply 1 application topically daily as needed (port access).  10/03/16   [provider]  lisinopril (PRINIVIL,ZESTRIL) 20 MG tablet Take 20 mg by mouth daily.  06/24/12   [provider]  metoprolol tartrate (LOPRESSOR) 25 MG tablet Take 0.5 tablets (12.5 mg total) by mouth 2 (two) times daily. 04/19/20   Sharen Hones, MD  Omega-3 1000 MG CAPS Take 2,000 mg by mouth 2 (two) times daily.     [provider]    Allergies Patient has no known allergies.  Family History  Problem Relation Age of Onset  . Cervical cancer  Sister   . CAD Father     Social History Social History   Tobacco Use  . Smoking status: Never Smoker  . Smokeless tobacco: Never Used  Vaping Use  . Vaping Use: Never used  Substance Use Topics  . Alcohol use: No    Alcohol/week: 0.0 standard drinks  . Drug use: No    Review of Systems  Review of Systems  Unable to perform ROS: Dementia      ____________________________________________   PHYSICAL EXAM:  VITAL SIGNS: ED Triage Vitals  Enc Vitals Group     BP 06/26/20 0821 (!) 156/87     Pulse Rate 06/26/20 0821 (!) 53     Resp 06/26/20 0821 18     Temp 06/26/20 0821 98.7 F (37.1 C)     Temp src --      SpO2 06/26/20 0821 98 %     Weight 06/26/20 0824 155 lb (70.3 kg)     Height 06/26/20 0824 5\' 7"  (1.702 m)     Head  Circumference --      Peak Flow --      Pain Score 06/26/20 0824 8     Pain Loc --      Pain Edu? --      Excl. in [place]? --    Vitals:   06/26/20 0821  BP: (!) 156/87  Pulse: (!) 53  Resp: 18  Temp: 98.7 F (37.1 C)  SpO2: 98%   Physical Exam Vitals and nursing note reviewed.  Constitutional:      Appearance: He is well-developed and well-nourished.  HENT:     Head: Normocephalic and atraumatic.     Right Ear: External ear normal.     Left Ear: External ear normal.     Nose: Nose normal.     Mouth/Throat:     Mouth: Mucous membranes are moist.  Eyes:     Conjunctiva/sclera: Conjunctivae normal.  Cardiovascular:     Rate and Rhythm: Regular rhythm. Bradycardia present.     Heart sounds: No murmur heard.   Pulmonary:     Effort: Pulmonary effort is normal. No respiratory distress.     Breath sounds: Normal breath sounds.  Abdominal:     Palpations: Abdomen is soft.     Tenderness: There is no abdominal tenderness.  Musculoskeletal:        General: No edema.     Cervical back: Neck supple.     Right lower leg: No edema.     Left lower leg: No edema.  Skin:    General: Skin is warm and dry.     Capillary Refill: Capillary refill takes less than 2 seconds.  Neurological:     General: No focal deficit present.     Mental Status: He is alert. Mental status is at baseline. He is disoriented.  Psychiatric:        Mood and Affect: Mood and affect and mood normal.     Cranial nerves II through XII grossly intact. Patient has full and symmetric strength of all extremities. No tenderness step-offs or deformities over the C/T/L-spine.  Patient is able to range his bilateral upper extremities including at the left shoulder with full range of motion. There is no large effusion or point tenderness anywhere. 2+ bilateral radial and DP pulses. ____________________________________________   ____ECG shows sinus bradycardia with ventricular rate of 53, normal axis, unremarkable  intervals, no evidence of acute ischemia or other significant underlying arrhythmia.  ________________________________________  RADIOLOGY  ED MD interpretation: No evidence of trauma including fracture or intracranial hemorrhage on CT head. CT C-spine shows no evidence of acute injury. Chest x-ray shows no evidence of pneumothorax pneumonia volume overload or rib fracture. Left shoulder x-rays unremarkable.  Pelvis x-ray shows no fracture.  Official radiology report(s): DG Chest 2 View  Result Date: 06/26/2020 CLINICAL DATA:  Fall. EXAM: CHEST - 2 VIEW COMPARISON:  Chest x-ray 05/22/2020. FINDINGS: Dual-lumen catheter stable position. Heart size stable. Low lung volumes. No focal infiltrate. No pleural effusion or pneumothorax. Left subclavian and upper extremity stents in stable position. IMPRESSION: 1. Dual-lumen catheter in stable position. 2. Low lung volumes with mild bibasilar atelectasis. No acute cardiopulmonary disease. Electronically Signed   By: Marcello Moores  Register   On: 06/26/2020 09:57   CT Head Wo Contrast  Result Date: 06/26/2020 CLINICAL DATA:  87 year old male status post fall, found down. EXAM: CT HEAD WITHOUT CONTRAST TECHNIQUE: Contiguous axial images were obtained from the base of the skull through the vertex without intravenous contrast. COMPARISON:  Head CT 05/22/2020 and earlier. FINDINGS: Brain: Cerebral volume is stable from last month. Stable gray-white matter differentiation throughout the brain. Patchy and confluent bilateral white matter hypodensity. No midline shift, ventriculomegaly, mass effect, evidence of mass lesion, intracranial hemorrhage or evidence of cortically based acute infarction. Vascular: Calcified atherosclerosis at the skull base. No suspicious intracranial vascular hyperdensity. Skull: No acute osseous abnormality identified. Sinuses/Orbits: Trace bubbly opacity posterior right ethmoid air cell, otherwise Visualized paranasal sinuses and mastoids are clear.  Other: No orbit or scalp soft tissue injury identified. Scalp vessel calcified atherosclerosis. IMPRESSION: Stable head CT. No acute intracranial abnormality or acute traumatic injury identified. Electronically Signed   By: Genevie Ann M.D.   On: 06/26/2020 10:29   CT Cervical Spine Wo Contrast  Result Date: 06/26/2020 CLINICAL DATA:  87 year old male status post fall, found down. EXAM: CT CERVICAL SPINE WITHOUT CONTRAST TECHNIQUE: Multidetector CT imaging of the cervical spine was performed without intravenous contrast. Multiplanar CT image reconstructions were also generated. COMPARISON:  None. FINDINGS: Alignment: Preserved cervical lordosis. Cervicothoracic junction alignment is within normal limits. Bilateral posterior element alignment is within normal limits. Skull base and vertebrae: Osteopenia. Visualized skull base is intact. No atlanto-occipital dissociation. C1 and C2 appear intact and normally aligned. Degenerative subchondral cysts at the base of the odontoid. No acute osseous abnormality identified. Soft tissues and spinal canal: No prevertebral fluid or swelling. No visible canal hematoma. Right lower neck dual lumen central line partially visible. Mild cervical carotid calcified atherosclerosis. Disc levels:  Mild for age cervical spine degeneration. Upper chest: Osteopenia. Grossly intact visible upper thoracic levels. Negative lung apices. Partially visible left subclavian vascular stent. IMPRESSION: No acute traumatic injury identified in the cervical spine. Electronically Signed   By: Genevie Ann M.D.   On: 06/26/2020 10:34   DG Pelvis Portable  Result Date: 06/26/2020 CLINICAL DATA:  Pain following fall EXAM: PORTABLE PELVIS 1-2 VIEWS COMPARISON:  April 24, 2015 FINDINGS: There is no evidence of pelvic fracture or dislocation. There is mild symmetric narrowing of each hip joint. No erosive change. Evidence of previous kyphoplasty at L3. IMPRESSION: No acute fracture or dislocation. Mild  symmetric narrowing each hip joint. Previous kyphoplasty at L3 noted. Electronically Signed   By: Lowella Grip III M.D.   On: 06/26/2020 11:11   DG Shoulder Left  Result Date: 06/26/2020 CLINICAL DATA:  Fall EXAM: LEFT SHOULDER - 2+ VIEW COMPARISON:  None. FINDINGS: Normal alignment with approximation of the joints.  No fracture or focal osseous lesion. Osteopenia. Mild acromioclavicular osteoarthrosis. Soft tissues are within normal limits. Upper extremity vascular stent grafts. IMPRESSION: No acute osseous abnormality. Mild acromioclavicular osteoarthrosis. Electronically Signed   By: Primitivo Gauze M.D.   On: 06/26/2020 09:59    ____________________________________________   PROCEDURES  Procedure(s) performed (including Critical Care):  Procedures   ____________________________________________   INITIAL IMPRESSION / ASSESSMENT AND PLAN / ED COURSE      Patient presents for assessment after concern for a fall at his nursing facility. On arrival patient denies any acute pain or other acute symptoms. No other additional history is available with notes and able to reach caregiver who saw this reported fall. Patient has no evidence of trauma on exam and is otherwise at his neurological baseline with no focal deficits. No imaging findings suggestive of acute trauma no very low suspicion for occult significant physical injury or occult fracture at this time. No historical or exam findings suggest acute infectious process and I will assess patient for metabolic derangement or other immediate life-threatening pathology at this time.  Low suspicion for ACS given patient denies any chest pain with reassuring EKG.  Evidence of arrhythmia.  Discussed with patient's wife who stated she is also at this nursing facility and will see patient when he gets back. Discharged in condition. Strict turn precaution advised and discussed   ____________________________________________   FINAL CLINICAL  IMPRESSION(S) / ED DIAGNOSES  Final diagnoses:  Fall, initial encounter    Medications  acetaminophen (TYLENOL) tablet 1,000 mg (has no administration in time range)     ED Discharge Orders    None       Note:  This document was prepared using Dragon voice recognition software and may include unintentional dictation errors.   Lucrezia Starch, MD 06/26/20 1045    Lucrezia Starch, MD 06/26/20 1121

## 2020-06-26 NOTE — ED Notes (Signed)
Patient's wristwatch placed in pocket of coat at bedside.

## 2020-06-26 NOTE — ED Notes (Signed)
Attempted to contact facility to transport back and give report, was unable to at this time. Transport being arranged at this time with EMS.

## 2020-06-26 NOTE — ED Notes (Addendum)
Per patient's wife, patient fell this morning and was seen here then discharged. Patient was then taken to doctor's appointment where he fell for a second time and hit head. Wife states he has not being acting like himself today and would like a social work consult to get him into assistive living. Wife's phone number is [phone].

## 2020-06-26 NOTE — ED Notes (Signed)
EMS here to take pt home. Last set VS obtained.

## 2020-06-26 NOTE — ED Notes (Signed)
Pt here for shoulder pain.

## 2020-06-26 NOTE — ED Triage Notes (Signed)
First Nurse Note:  Arrives via [facility].  From [facility]  CBG:  97.  Called for fall.  Per report, wife reports patient was on the floor for 3 hours.  C/O left shoulder pain.  Patient has history of dementia.

## 2020-06-26 NOTE — ED Triage Notes (Addendum)
BIBA from PCP's office for AMS. Patient was DC'd from this ED earlier today after being eval'd for fall. Went back to [facility], then to PCP where PCP staff called EMS from waiting room. Patient is drowsy, disoriented x4, but in NAD.   Addendum: Patient's family now at bedside report patient fell again today at the PCP office. No LOC.

## 2020-06-26 NOTE — ED Notes (Signed)
Pt found out of room, this tech and Cari FNP placed pt back into room, changed linens due to incontinence and placed pt back into bed.

## 2020-06-26 NOTE — ED Provider Notes (Signed)
[facility] Emergency Department Provider Note   ____________________________________________   I have reviewed the triage vital signs and the nursing notes.   HISTORY  Chief Complaint Altered Mental Status and Fall   History limited by and level 5 caveat due to: demenita   HPI Alejandro Armstrong is a 87 y.o. male who presents to the emergency department today because of concern for altered mental status. The patient was seen in the emergency department earlier today after a fall. Imaging was negative at that time. Per report that wife left with nurse the patient had another fall at his doctors appointment later in the day and hit his head again. The wife also would like placement for the patient.   Records reviewed. Per medical record review patient has a history of dementia, discharge from the ER at roughly 1pm today after evaluation for a fall. Imaging was negative during that visit.   Past Medical History:  Diagnosis Date  . Arthritis   . Dialysis patient [facility])    Mon, Wed Fri  . Hyperlipidemia   . Hypertension   . Hypothyroidism   . Idiopathic thrombocytopenia purpura ([place])   . Kidney stones   . Recurrent UTI   . Renal agenesis    discovered at age 10  . Type 2 diabetes mellitus ([place])   . Ureteral stricture     Patient Active Problem List   Diagnosis Date Noted  . Volume overload 05/22/2020  . 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 Leukocytosis 04/24/2020  . Anemia in ESRD (end-stage renal disease) ([place]) 04/24/2020  . Acute hypoxemic respiratory failure ([place]) 04/18/2020  . Pneumonia due to COVID-19 virus   . Generalized weakness   . Recurrent UTI 02/16/2020  . Chest pain 11/04/2019  . Complication of vascular access for dialysis 11/20/2017  . Hyperlipidemia 03/26/2017  . ESRD (end stage renal disease) on dialysis ([place]) 03/26/2017  . Postop check 04/30/2016  . Fever chills 09/18/2015  . Bacteremia 09/18/2015  . Acute on chronic  renal insufficiency 09/01/2015  . Hyperkalemia 09/01/2015  . MRSA bacteremia secondary to PICC line infection 09/01/2015  . Type II diabetes mellitus with renal manifestations ([place]) 09/01/2015  . UTI (lower urinary tract infection) 08/06/2015  . Idiopathic thrombocytopenic purpura ([place]) 08/02/2015  . B12 deficiency 07/24/2015  . Pseudomonas infection 04/28/2014  . 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 Urinary urgency 02/21/2013  . Hypertrophy of prostate with urinary obstruction and other lower urinary tract symptoms (LUTS) 11/18/2012  . Calculus of kidney 03/12/2012  . Increased frequency of urination 03/12/2012  . Nocturia 03/12/2012  . 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 Urethral stricture 03/12/2012  . Urinary tract infection 03/12/2012  . Essential hypertension 03/31/2011  . Hypothyroidism 03/31/2011  . Chronic kidney disease, stage IV (severe) ([place]) 03/31/2011    Past Surgical History:  Procedure Laterality Date  . A/V FISTULAGRAM Left 03/31/2017   Procedure: A/V Fistulagram;  Surgeon: [name], MD;  Location: [place] [facility];  Service: Cardiovascular;  Laterality: Left;  . A/V FISTULAGRAM Left 09/07/2018   Procedure: A/V FISTULAGRAM;  Surgeon: [name], MD;  Location: [place] [facility];  Service: Cardiovascular;  Laterality: Left;  . A/V SHUNTOGRAM Left 07/28/2017   Procedure: A/V SHUNTOGRAM;  Surgeon: [name], MD;  Location: [place] [facility];  Service: Cardiovascular;  Laterality: Left;  . A/V SHUNTOGRAM Left 03/24/2018   Procedure: A/V SHUNTOGRAM;  Surgeon: [name], MD;  Location: [place]  [facility];  Service: Cardiovascular;  Laterality: Left;  . APPENDECTOMY    . AV FISTULA PLACEMENT Left 04/16/2016   Procedure: INSERTION OF ARTERIOVENOUS (AV) GORE-TEX GRAFT ARM;  Surgeon: [name], MD;  Location: [facility] ORS;  Service: Vascular;  Laterality: Left;  . CATARACT EXTRACTION W/ INTRAOCULAR LENS IMPLANT  Bilateral 2014   done 1-2 months apart at [facility].  [name] FINGER SURGERY Left 2002   pinky finger  . KIDNEY STONE SURGERY    . KYPHOPLASTY [name] 09/16/2018   Procedure: KYPHOPLASTY L3, DIABETIC;  Surgeon: Hessie Knows, MD;  Location: [facility] ORS;  Service: Orthopedics;  Laterality: [name];  . NASAL SINUS SURGERY  1985  . PERIPHERAL VASCULAR CATHETERIZATION  05/13/2016   Procedure: Upper Extremity Angiography;  Surgeon: [name], MD;  Location: [place] [facility];  Service: Cardiovascular;;  . PERIPHERAL VASCULAR CATHETERIZATION  05/13/2016   Procedure: Upper Extremity Intervention;  Surgeon: [name], MD;  Location: [place] [facility];  Service: Cardiovascular;;  . PERIPHERAL VASCULAR THROMBECTOMY [name] 04/16/2020   Procedure: PERIPHERAL VASCULAR THROMBECTOMY, possible permcath placement;  Surgeon: Algernon Huxley, MD;  Location: [place] [facility];  Service: Cardiovascular;  Laterality: [name];  . PERIPHERAL VASCULAR THROMBECTOMY Left 06/05/2020   Procedure: PERIPHERAL VASCULAR THROMBECTOMY;  Surgeon: [name], MD;  Location: [place] [facility];  Service: Cardiovascular;  Laterality: Left;  . PICC LINE PLACE PERIPHERAL ([place] HX) Right 08/2015  . PICC LINE REMOVAL ([place] HX) Right 09/2015    Prior to Admission medications   Medication Sig Start Date End Date Taking? Authorizing Provider  acetaminophen (TYLENOL) 500 MG tablet Take 1,000 mg by mouth every 6 (six) hours as needed for moderate pain or headache.     [provider]  albuterol (VENTOLIN HFA) 108 (90 Base) MCG/ACT inhaler Inhale 2 puffs into the lungs every 6 (six) hours. 04/19/20   Sharen Hones, MD  atorvastatin (LIPITOR) 10 MG tablet Take 10 mg by mouth daily.    [provider]  B Complex-C-Folic Acid (RENA-VITE RX) 1 MG TABS Take 1 tablet by mouth daily. 04/16/20   [provider]  calcium acetate (PHOSLO) 667 MG capsule Take 2 capsules (1,334 mg total) by mouth 3 (three) times daily with  meals. 04/19/20   Sharen Hones, MD  Carboxymethylcellulose Sodium (THERATEARS) 0.25 % SOLN Place 1-2 drops into both eyes 3 (three) times daily as needed (for dry eyes).    [provider]  Cholecalciferol (VITAMIN D3) 1.25 MG (50000 UT) CAPS Take 50,000 Units by mouth every Wednesday.     [provider]  hydrALAZINE (APRESOLINE) 25 MG tablet Take 25 mg by mouth 2 (two) times daily.    [provider]  levothyroxine (SYNTHROID) 125 MCG tablet Take 125 mcg by mouth daily before breakfast.     [provider]  lidocaine-prilocaine (EMLA) cream Apply 1 application topically daily as needed (port access).  10/03/16   [provider]  lisinopril (PRINIVIL,ZESTRIL) 20 MG tablet Take 20 mg by mouth daily.  06/24/12   [provider]  metoprolol tartrate (LOPRESSOR) 25 MG tablet Take 0.5 tablets (12.5 mg total) by mouth 2 (two) times daily. 04/19/20   Sharen Hones, MD  Omega-3 1000 MG CAPS Take 2,000 mg by mouth 2 (two) times daily.     [provider]    Allergies Patient has no known allergies.  Family History  Problem Relation Age of Onset  . Cervical cancer Sister   . CAD Father  Social History Social History   Tobacco Use  . Smoking status: Never Smoker  . Smokeless tobacco: Never Used  Vaping Use  . Vaping Use: Never used  Substance Use Topics  . Alcohol use: No    Alcohol/week: 0.0 standard drinks  . Drug use: No    Review of Systems Unable to obtain reliable ROS secondary to dementia.  ____________________________________________   PHYSICAL EXAM:  VITAL SIGNS: ED Triage Vitals [06/26/20 1541]  Enc Vitals Group     BP (!) 194/94     Pulse Rate (!) 56     Resp 16     Temp 99 F (37.2 C)     Temp Source Axillary     SpO2 100 %   Constitutional: Dementia.  Eyes: Conjunctivae are normal.  ENT      Head: Normocephalic. No lacerations.       Nose: No congestion/rhinnorhea.      Mouth/Throat: Mucous  membranes are moist.      Neck: No stridor. No midline tenderness.  Hematological/Lymphatic/Immunilogical: No cervical lymphadenopathy. Cardiovascular: Normal rate, regular rhythm.  No murmurs, rubs, or gallops.  Respiratory: Normal respiratory effort without tachypnea nor retractions. Breath sounds are clear and equal bilaterally. No wheezes/rales/rhonchi. Gastrointestinal: Soft and non tender. No rebound. No guarding.  Genitourinary: Deferred Musculoskeletal: Normal range of motion in all extremities. No lower extremity edema. Neurologic:  Dementia. Moving all extremities.  Skin:  Skin is warm, dry and intact. No rash noted. ____________________________________________    LABS (pertinent positives/negatives)  UA turbid, small, hgb dipstick, positive nitrite, large leukocytes, >50 rbc and wbc BMP na 134, k 3.9, glu 89, cr 5.90 CBC wbc 7.7, hgb 10.8, plt 190 ____________________________________________   EKG  I, Nance Pear, attending physician, personally viewed and interpreted this EKG  EKG Time: 1546 Rate: 56 Rhythm: sinus bradycardia Axis: normal Intervals: qtc 428 QRS: narrow ST changes: no st elevation Impression: abnormal ekg  ____________________________________________    RADIOLOGY  CT head/cervical spine No acute traumatic injuries  ____________________________________________   PROCEDURES  Procedures  ____________________________________________   INITIAL IMPRESSION / ASSESSMENT AND PLAN / ED COURSE  Pertinent labs & imaging results that were available during my care of the patient were reviewed by me and considered in my medical decision making (see chart for details).   Patient presented to the emergency department today because of concern for another fall, had been seen earlier in the ED. Per report patient's wife is requesting placement. Work up here with negative head/cervical spine ct scans. UA is concerning for infection, however no  leukocytosis. Will give dose of IV abx here in the emergency department. Will plan on admission.  ____________________________________________   FINAL CLINICAL IMPRESSION(S) / ED DIAGNOSES  Final diagnoses:  Lower urinary tract infectious disease  Fall, initial encounter     Note: This dictation was prepared with Sales executive. Any transcriptional errors that result from this process are unintentional     Nance Pear, MD 06/26/20 2143

## 2020-06-26 NOTE — ED Triage Notes (Addendum)
Pt comes via EMS from [facility] with c/o fall Pt denies any LOC or hitting his head.  Pt states he got up to pee and he tripped and fell. Pt states he should have put on his bedroom shoes.  Pt states left shoulder pain.

## 2020-06-26 NOTE — ED Notes (Signed)
Patient at CT scan.

## 2020-06-27 DIAGNOSIS — R296 Repeated falls: Secondary | ICD-10-CM | POA: Diagnosis not present

## 2020-06-27 LAB — CK: Total CK: 132 U/L (ref 49–397)

## 2020-06-27 LAB — BASIC METABOLIC PANEL
Anion gap: 12 (ref 5–15)
BUN: 27 mg/dL — ABNORMAL HIGH (ref 8–23)
CO2: 26 mmol/L (ref 22–32)
Calcium: 9.3 mg/dL (ref 8.9–10.3)
Chloride: 101 mmol/L (ref 98–111)
Creatinine, Ser: 6.57 mg/dL — ABNORMAL HIGH (ref 0.61–1.24)
GFR, Estimated: 8 mL/min — ABNORMAL LOW (ref >60)
Glucose, Bld: 76 mg/dL (ref 70–99)
Potassium: 4.1 mmol/L (ref 3.5–5.1)
Sodium: 139 mmol/L (ref 135–145)

## 2020-06-27 LAB — TSH
TSH: 150 u[IU]/mL — ABNORMAL HIGH (ref 0.350–4.500)
TSH: 154.551 u[IU]/mL — ABNORMAL HIGH (ref 0.350–4.500)

## 2020-06-27 LAB — CBC
HCT: 32 % — ABNORMAL LOW (ref 39.0–52.0)
Hemoglobin: 10.4 g/dL — ABNORMAL LOW (ref 13.0–17.0)
MCH: 32.5 pg (ref 26.0–34.0)
MCHC: 32.5 g/dL (ref 30.0–36.0)
MCV: 100 fL (ref 80.0–100.0)
Platelets: 158 10*3/uL (ref 150–400)
RBC: 3.2 MIL/uL — ABNORMAL LOW (ref 4.22–5.81)
RDW: 14.7 % (ref 11.5–15.5)
WBC: 5.3 10*3/uL (ref 4.0–10.5)
nRBC: 0 % (ref 0.0–0.2)

## 2020-06-27 LAB — T4, FREE: Free T4: 0.3 ng/dL — ABNORMAL LOW (ref 0.61–1.12)

## 2020-06-27 LAB — SARS CORONAVIRUS 2 (TAT 6-24 HRS): SARS Coronavirus 2: NEGATIVE

## 2020-06-27 MED ORDER — LEVOTHYROXINE SODIUM 50 MCG PO TABS
175.0000 ug | ORAL_TABLET | Freq: Every day | ORAL | Status: DC
  Administered 2020-06-30 – 2020-07-01 (×2): 175 ug via ORAL
  Filled 2020-06-27 (×4): qty 1

## 2020-06-27 MED ORDER — HALOPERIDOL LACTATE 5 MG/ML IJ SOLN
1.0000 mg | Freq: Four times a day (QID) | INTRAMUSCULAR | Status: DC | PRN
  Administered 2020-06-27: 1 mg via INTRAMUSCULAR
  Filled 2020-06-27: qty 1

## 2020-06-27 MED ORDER — HYDRALAZINE HCL 20 MG/ML IJ SOLN
10.0000 mg | Freq: Four times a day (QID) | INTRAMUSCULAR | Status: DC | PRN
  Administered 2020-06-30: 10 mg via INTRAVENOUS
  Filled 2020-06-27: qty 1

## 2020-06-27 NOTE — TOC Initial Note (Signed)
Transition of Care [facility]) - Initial/Assessment Note    Patient Details  Name: Alejandro Armstrong MRN: [id number] Date of Birth: 1929-07-16  Transition of Care [facility]) CM/SW Contact:    Shelbie Ammons, RN Phone Number: 06/27/2020, 3:07 PM  Clinical Narrative:   RNCM reached out to patient's wife Izora Gala to discuss discharge recommendations. Patient came into hospital twice in same day after falls, the last time he was sent by his PCP. Patient lives in an independent living facility with his wife who has a caregiver of her own. Patient was at [facility] back in December and since he returned home has been getting PT from their in house group. Wife reports that patient has continued to become more unsteady with numerous falls and that the facility as well as his PCP feel he needs a higher level of care likely SNF. Wife reports that they have already been in contact with [facility]. RNCM started [facility] and sent patient to [facility] through the hub.               Expected Discharge Plan: Skilled Nursing Facility Barriers to Discharge: No Barriers Identified   Patient Goals and CMS Choice        Expected Discharge Plan and Services Expected Discharge Plan: [place] Choice: [place] Living arrangements for the past 2 months: [place]                                      Prior Living Arrangements/Services Living arrangements for the past 2 months: [place] Lives with:: Spouse Patient language and need for interpreter reviewed:: Yes        Need for Family Participation in Patient Care: Yes (Comment) Care giver support system in place?: Yes (comment)   Criminal Activity/Legal Involvement Pertinent to Current Situation/Hospitalization: No - Comment as needed  Activities of Daily Living      Permission Sought/Granted                  Emotional Assessment               Admission diagnosis:  Lower urinary tract infectious disease [N39.0] Multiple falls [R29.6] Fall, initial encounter [W19.XXXA] Patient Active Problem List   Diagnosis Date Noted  . Multiple falls 06/26/2020  . Volume overload 05/22/2020  . 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 Leukocytosis 04/24/2020  . Anemia in ESRD (end-stage renal disease) ([place]) 04/24/2020  . Acute hypoxemic respiratory failure ([place]) 04/18/2020  . Pneumonia due to COVID-19 virus   . Generalized weakness   . Recurrent UTI 02/16/2020  . Complication of vascular access for dialysis 11/20/2017  . Hyperlipidemia 03/26/2017  . ESRD (end stage renal disease) on dialysis ([place]) 03/26/2017  . Postop check 04/30/2016  . Fever chills 09/18/2015  . Acute on chronic renal insufficiency 09/01/2015  . Hyperkalemia 09/01/2015  . MRSA bacteremia secondary to PICC line infection 09/01/2015  . Type II diabetes mellitus with renal manifestations ([place]) 09/01/2015  . UTI (lower urinary tract infection) 08/06/2015  . Idiopathic thrombocytopenic purpura ([place]) 08/02/2015  . B12 deficiency 07/24/2015  . Pseudomonas infection 04/28/2014  . 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 Urinary urgency 02/21/2013  . Hypertrophy of prostate with urinary obstruction and other lower urinary tract symptoms (LUTS) 11/18/2012  . Calculus of kidney 03/12/2012  . Increased frequency of  urination 03/12/2012  . Nocturia 03/12/2012  . 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 Urethral stricture 03/12/2012  . Urinary tract infection 03/12/2012  . Essential hypertension 03/31/2011  . Hypothyroidism 03/31/2011  . Chronic kidney disease, stage IV (severe) ([place]) 03/31/2011   PCP:  Casilda Carls, MD Pharmacy:   [id number] Lorina Rabon, [place] [address] [place] [address] Phone: [phone] Fax: [phone]     Social Determinants of Health (SDOH) Interventions    Readmission Risk  Interventions Readmission Risk Prevention Plan 04/18/2020  Transportation Screening Complete  PCP or Specialist Appt within 3-5 Days Complete  HRI or [place] Complete  Social Work Consult for [place] Planning/Counseling Complete  Palliative Care Screening Not Applicable  Medication Review Press photographer) Complete  Some recent data might be hidden

## 2020-06-27 NOTE — Evaluation (Signed)
Occupational Therapy Evaluation Patient Details Name: Alejandro Armstrong MRN: [id number] DOB: Dec 01, 1929 Today's Date: 06/27/2020    History of Present Illness Patient is an 87 year old male who presents to the ED for weakness and reapeated falls at home. PMH includes ESRD -HD (MWF), HLD, HTN, DM non insulin dependent, hypothyroidism, ITP, MRSA (2017) with PICC line in R arm, clot formation in R arm, COVID w pneumonia 04/07/20, hypothyroidism, arthritis, kidney stones, renal agenesis, CKD stage IV.   Clinical Impression   Alejandro Armstrong was seen for OT evaluation this date. Pt is profoundly HOH and presents at mildly confused during session. He is unable to provide much information regarding PLOF/home set-up. Per chart, he has been living alone since his wife wen to rehab ~2 months prior to admission. Per chart, pt has had multiple falls in the home. Currently pt demonstrates impairments including decreased safety awareness, decreased balance, and generalized weakness, which functionally limit his ability to perform ADL/self-care tasks. Pt currently requires MIN-MOD A for functional mobility and to maintain static standing balance during grooming tasks. No physical assist required for performing oral care or face washing, however increased multimodal cueing required for safety/sequencing with these tasks as well.  Pt would benefit from skilled OT services to address noted impairments and functional limitations (see below for any additional details) in order to maximize safety and independence while minimizing falls risk and caregiver burden. Upon hospital discharge, recommend STR to maximize pt safety and return to PLOF.      Follow Up Recommendations  Supervision/Assistance - 24 hour;SNF    Equipment Recommendations  3 in 1 bedside commode    Recommendations for Other Services       Precautions / Restrictions Precautions Precautions: Fall Restrictions Weight Bearing Restrictions: No       Mobility Bed Mobility Overal bed mobility: Needs Assistance             General bed mobility comments: Deferred. Pt in recliner at start/end of session.    Transfers Overall transfer level: Needs assistance Equipment used: Rolling walker (2 wheeled) Transfers: Sit to/from Stand Sit to Stand: Min assist         General transfer comment: Pt unable to come to stand upon initial attempt when not assisted by therapist. He requires MIN A to clear buttocks off room recliner and is noted to yell out in pain upon initial come to stand, but is unable to state where/what hurts him.    Balance Overall balance assessment: Needs assistance Sitting-balance support: Feet supported Sitting balance-Leahy Scale: Fair Sitting balance - Comments: Steady static sitting, reaching within BOS.   Standing balance support: During functional activity;Single extremity supported Standing balance-Leahy Scale: Poor Standing balance comment: Maintains 1 UE support on device/surfaces during all standing tasks. He is unable to perform static standing grooming tasks without MOD-CGA assist to maintain upright position while at sink. Posterior LOB noted x3 during functional mobility. Pt require physical assist from therapist to safely recover standing balance.                           ADL either performed or assessed with clinical judgement   ADL Overall ADL's : Needs assistance/impaired Eating/Feeding: Sitting;Set up;Supervision/ safety   Grooming: Standing;Oral care;Wash/dry face;Moderate assistance Grooming Details (indicate cue type and reason): Moderate assist to maintain standing balance at sink. Pt briefly able to hold balance using BUE support on counter top, but is generally unsafe when not physically supported  by therapist during this task.                             Functional mobility during ADLs: Minimal assistance;Cueing for safety;Cueing for sequencing;Rolling  walker General ADL Comments: Min A to maintain static standing balance and for safe use of RW this PM. Pt with significant decreased safety awareness. Per chart, has been having multiple falls in the home.     Vision         Perception     Praxis      Pertinent Vitals/Pain Pain Assessment: Faces Faces Pain Scale: Hurts little more Pain Location: Pt yells out in pain with mobility attempt this date. Pt gestures toward leg, but does not identify what body part hurts specifically. Monitored t/o session. Pt states "it gets better once I get up and going" Pain Intervention(s): Monitored during session;Repositioned     Hand Dominance Right   Extremity/Trunk Assessment Upper Extremity Assessment Upper Extremity Assessment: Generalized weakness   Lower Extremity Assessment Lower Extremity Assessment: Generalized weakness   Cervical / Trunk Assessment Cervical / Trunk Assessment: Kyphotic   Communication Communication Communication: HOH (Very HOH)   Cognition Arousal/Alertness: Awake/alert Behavior During Therapy: Restless Overall Cognitive Status: Difficult to assess                                 General Comments: Pt profoundly hard of hearing. Is able to state name, but otherwise has difficulty following VCs during session. Benefits from gesture/object prompting. Is observed to have garbled/nonsensical speech at times, and intermittently engages in unorthodox behaviors such as using his toothbrush to scrub his hand in the middle of oral care.   General Comments       Exercises Other Exercises Other Exercises: Pt education limited 2/2 HOH. OT facilitates functional mobility in room as well as standing grooming tasks at sink with safety cueing and physical assist provided t/o as described above. See ADL section for additional detail.   Shoulder Instructions      Home Living Family/patient expects to be discharged to:: Private residence Living Arrangements:  Alone   Type of Home: House Home Access: Stairs to enter   Entrance Stairs-Rails: Right Home Layout: One level     Bathroom Shower/Tub: Occupational psychologist: Standard     Home Equipment: Environmental consultant - 2 wheels   Additional Comments: PLOF majority gathered from previous hospital admissions. Pt with some confusion, HOH, but able to state his name, month      Prior Functioning/Environment Level of Independence: Needs assistance                 OT Problem List: Decreased strength;Decreased coordination;Decreased safety awareness;Decreased knowledge of use of DME or AE;Impaired balance (sitting and/or standing)      OT Treatment/Interventions: Self-care/ADL training;Therapeutic exercise;Therapeutic activities;Energy conservation;DME and/or AE instruction;Patient/family education;Balance training    OT Goals(Current goals can be found in the care plan section) Acute Rehab OT Goals Patient Stated Goal: to get up and go OT Goal Formulation: With patient Time For Goal Achievement: 07/11/20 Potential to Achieve Goals: Good ADL Goals Pt Will Perform Grooming: with supervision;with adaptive equipment;standing (c LRAD PRN for improved safety and functional indep.) Pt Will Perform Lower Body Dressing: with adaptive equipment;with min assist (c LRAD PRN for improved safety and functional indep.) Pt Will Transfer to Toilet: bedside commode;regular height toilet;with supervision;with  set-up (c LRAD PRN for improved safety and functional indep.) Pt Will Perform Toileting - Clothing Manipulation and hygiene: with adaptive equipment;with supervision;with set-up  OT Frequency: Min 1X/week   Barriers to D/C: Decreased caregiver support          Co-evaluation              AM-PAC OT "6 Clicks" Daily Activity     Outcome Measure Help from another person eating meals?: A Little Help from another person taking care of personal grooming?: A Little Help from another person  toileting, which includes using toliet, bedpan, or urinal?: A Lot Help from another person bathing (including washing, rinsing, drying)?: A Little Help from another person to put on and taking off regular upper body clothing?: A Little Help from another person to put on and taking off regular lower body clothing?: A Lot 6 Click Score: 16   End of Session Equipment Utilized During Treatment: Gait belt;Rolling walker Nurse Communication: Mobility status;Patient requests pain meds  Activity Tolerance: Patient tolerated treatment well Patient left: in chair;with call bell/phone within reach;with chair alarm set  OT Visit Diagnosis: Other abnormalities of gait and mobility (R26.89);Muscle weakness (generalized) (M62.81)                Time: 3403-7096 OT Time Calculation (min): 23 min Charges:  OT General Charges $OT Visit: 1 Visit OT Evaluation $OT Eval Moderate Complexity: 1 Mod OT Treatments $Self Care/Home Management : 8-22 mins  Shara Blazing, M.S., OTR/L Ascom: [phone] 06/27/20, 3:50 PM

## 2020-06-27 NOTE — Progress Notes (Signed)
   06/27/20 1147  Assess: MEWS Score  Temp 98.2 F (36.8 C)  BP (!) 149/84  Pulse Rate (!) 46  Resp 16  SpO2 99 %  O2 Device Room Air  Assess: MEWS Score  MEWS Temp 0  MEWS Systolic 0  MEWS Pulse 1  MEWS RR 0  MEWS LOC 1  MEWS Score 2  MEWS Score Color Yellow  Assess: if the MEWS score is Yellow or Red  Were vital signs taken at a resting state? Yes  Focused Assessment Change from prior assessment (see assessment flowsheet)  Early Detection of Sepsis Score *See Row Information* Low  MEWS guidelines implemented *See Row Information* Yes  Notify: Charge Nurse/RN  Name of Charge Nurse/RN Notified Melanie RN  Date Charge Nurse/RN Notified 06/27/20  Time Charge Nurse/RN Notified 1150  Notify: Provider  Provider Name/Title Dr. Reesa Chew  Date Provider Notified 06/27/20  Time Provider Notified 1155  Notification Type Page  Notification Reason Other (Comment) (HR mid 40s)  Response No new orders  Date of Provider Response 06/27/20  Time of Provider Response 1200  Document  Patient Outcome Other (Comment) (stable)

## 2020-06-27 NOTE — Progress Notes (Signed)
Pt was very restless and agitated during assessment. Deliberately removed IV from site. RN sent secure lessage to provider Mansy informing of H&H, reason for admission, and situation. See orders in [facility]. Administered 1mg  Haldol IM. Will continue to monitor for effectiveness.

## 2020-06-27 NOTE — Progress Notes (Signed)
PROGRESS NOTE    Alejandro Armstrong  CHE:[id number] DOB: 10/18/1929 DOA: 06/26/2020 PCP: Casilda Carls, MD   Brief Narrative: Taken from H&P Alejandro Armstrong is a 87 y.o. male with medical history significant for dementia, ESRD on hemodialysis, hypertension, and hypothyroidism, presenting to the emergency department for evaluation of generalized weakness and recurrent falls.  History is primarily obtained from the patient's wife who reports that the patient has been increasingly weak in general, fell twice today which is unusual for him, and she is unable to care for him at their retirement community.  Patient had similar symptoms last month for which she was admitted to the hospital, diagnosed with UTI, discharged to SNF, and had improved.  He has now developed recurrent general weakness, fell this morning, was evaluated in the emergency department and not found to have any significant injury or obvious indication for hospital admission at that time, but went to see an outpatient physician where he fell again.   Imaging without any significant abnormality, labs with markedly elevated TSH and UA  was positive for pyuria and nitrites, urine cultures pending.  Subjective: Patient was sitting comfortably in chair when seen today.  Denies any complaint.  Very hard of hearing.  Able to answer some questions appropriately, when asked about medication he said ask my wife I do not know.  Assessment & Plan:   Principal Problem:   Multiple falls Active Problems:   ESRD (end stage renal disease) on dialysis [facility])   Essential hypertension   Hypothyroidism   Recurrent UTI   Anemia in ESRD (end-stage renal disease) (HCC)  Generalized weakness/frequent falls.  UTI can be contributory.  Patient was recently sent home from rehab.  Family wants to put him back to rehab and willing to pay privately as unable to take care of him at home. Talked with caregiver and wife and patient is refusing to go to  dialysis and medications at home. -PT recommended 24/7 care with home health. -Family is trying to put him to [facility] with private pay.  UTI.  UA with persistent pyuria and nitrites.  Recent urine culture with Pseudomonas so he was started on cefepime.  Remained afebrile and no leukocytosis.  Could not explain any urinary symptoms. -Follow-up urine cultures -Continue with cefepime for now  Hypothyroidism.  TSH came back markedly elevated at 150, repeat 154.  It was within normal limit 2 months ago.  Talked with wife and caregiver and apparently he was not taking his thyroid medications properly.  Free T4 is also decreased. -Check T4 and T3 levels -Increase the dose of Synthroid to 175 MCG. -He will need a repeat TSH in 4 weeks.  ESRD.  Family is struggling with him to go for dialysis regularly, last dialysis was on Monday.  No acute indication for urgent dialysis.  Patient normally gets dialysis Monday, Wednesday and Friday. -Nephrology was consulted.  Hypertension.  Blood pressure mildly elevated. -Continue with lisinopril and hydralazine. -Hold Lopressor due to bradycardia.  Bradycardia.  Nursing concern of heart rate being in 40s, patient remained asymptomatic. -Hold Lopressor.  Dementia. -Continue with supportive care and delirium precautions.  Anemia of chronic disease.  Hemoglobin stable. -Continue to monitor.  Objective: Vitals:   06/27/20 0813 06/27/20 1147 06/27/20 1245 06/27/20 1400  BP: (!) 148/77 (!) 149/84 (!) 156/78 (!) 169/84  Pulse: 68 (!) 46 (!) 59 (!) 52  Resp: 18 16 17 18   Temp: 98.2 F (36.8 C) 98.2 F (36.8 C) 98.4 F (36.9  C) 98.4 F (36.9 C)  TempSrc:      SpO2: 96% 99% 97% 98%  Weight:      Height:        Intake/Output Summary (Last 24 hours) at 06/27/2020 1504 Last data filed at 06/27/2020 1021 Gross per 24 hour  Intake 60 ml  Output 300 ml  Net -240 ml   Filed Weights   06/27/20 0329  Weight: 70.1 kg    Examination:  General  exam: Very hard of hearing elderly man, appears calm and comfortable  Respiratory system: Clear to auscultation. Respiratory effort normal. Cardiovascular system: S1 & S2 heard, RRR. Gastrointestinal system: Soft, nontender, nondistended, bowel sounds positive. Central nervous system: Alert and oriented. No focal neurological deficits. Extremities: No edema, no cyanosis, pulses intact and symmetrical. Skin: Appears very dry Psychiatry: Judgement and insight appear impaired   DVT prophylaxis: Heparin Code Status: DNR Family Communication: Talked with wife and caregiver on phone Disposition Plan:  Status is: Observation  The patient remains OBS appropriate and will d/c before 2 midnights.  Dispo: The patient is from: Home              Anticipated d/c is to: SNF              Anticipated d/c date is: 1 day              Patient currently is not medically stable to d/c.   Consultants:   Nephrology  Procedures:  Antimicrobials:  Cefepime  Data Reviewed: I have personally reviewed following labs and imaging studies  CBC: Recent Labs  Lab 06/26/20 1544 06/27/20 0330  WBC 7.7 5.3  NEUTROABS 5.3  --   HGB 10.8* 10.4*  HCT 32.5* 32.0*  MCV 99.7 100.0  PLT 190 308   Basic Metabolic Panel: Recent Labs  Lab 06/26/20 1544 06/27/20 0330  NA 134* 139  K 3.9 4.1  CL 95* 101  CO2 25 26  GLUCOSE 89 76  BUN 23 27*  CREATININE 5.90* 6.57*  CALCIUM 9.6 9.3   GFR: Estimated Creatinine Clearance: 7.3 mL/min (A) (by C-G formula based on SCr of 6.57 mg/dL (H)). Liver Function Tests: No results for input(s): AST, ALT, ALKPHOS, BILITOT, PROT, ALBUMIN in the last 168 hours. No results for input(s): LIPASE, AMYLASE in the last 168 hours. No results for input(s): AMMONIA in the last 168 hours. Coagulation Profile: No results for input(s): INR, PROTIME in the last 168 hours. Cardiac Enzymes: Recent Labs  Lab 06/27/20 0330  CKTOTAL 132   BNP (last 3 results) No results for  input(s): PROBNP in the last 8760 hours. HbA1C: No results for input(s): HGBA1C in the last 72 hours. CBG: No results for input(s): GLUCAP in the last 168 hours. Lipid Profile: No results for input(s): CHOL, HDL, LDLCALC, TRIG, CHOLHDL, LDLDIRECT in the last 72 hours. Thyroid Function Tests: Recent Labs    06/27/20 0330  TSH 154.551*  150.000*  FREET4 0.30*   Anemia Panel: No results for input(s): VITAMINB12, FOLATE, FERRITIN, TIBC, IRON, RETICCTPCT in the last 72 hours. Sepsis Labs: No results for input(s): PROCALCITON, LATICACIDVEN in the last 168 hours.  Recent Results (from the past 240 hour(s))  SARS CORONAVIRUS 2 (TAT 6-24 HRS) Nasopharyngeal Nasopharyngeal Swab     Status: None   Collection Time: 06/26/20  4:53 PM   Specimen: Nasopharyngeal Swab  Result Value Ref Range Status   SARS Coronavirus 2 NEGATIVE NEGATIVE Final    Comment: (NOTE) SARS-CoV-2 target nucleic acids are NOT  DETECTED.  The SARS-CoV-2 RNA is generally detectable in upper and lower respiratory specimens during the acute phase of infection. Negative results do not preclude SARS-CoV-2 infection, do not rule out co-infections with other pathogens, and should not be used as the sole basis for treatment or other patient management decisions. Negative results must be combined with clinical observations, patient history, and epidemiological information. The expected result is Negative.  Fact Sheet for Patients: SugarRoll.be  Fact Sheet for Healthcare Providers: https://www.woods-mathews.com/  This test is not yet approved or cleared by the [place] FDA and  has been authorized for detection and/or diagnosis of SARS-CoV-2 by FDA under an Emergency Use Authorization (EUA). This EUA will remain  in effect (meaning this test can be used) for the duration of the COVID-19 declaration under Se ction 564(b)(1) of the Act, 21 U.S.C. section 360bbb-3(b)(1), unless  the authorization is terminated or revoked sooner.  Performed at [facility], [place] [address], [place], [place] [address]      Radiology Studies: DG Chest 2 View  Result Date: 06/26/2020 CLINICAL DATA:  Fall. EXAM: CHEST - 2 VIEW COMPARISON:  Chest x-ray 05/22/2020. FINDINGS: Dual-lumen catheter stable position. Heart size stable. Low lung volumes. No focal infiltrate. No pleural effusion or pneumothorax. Left subclavian and upper extremity stents in stable position. IMPRESSION: 1. Dual-lumen catheter in stable position. 2. Low lung volumes with mild bibasilar atelectasis. No acute cardiopulmonary disease. Electronically Signed   By: Marcello Moores  Register   On: 06/26/2020 09:57   CT Head Wo Contrast  Result Date: 06/26/2020 CLINICAL DATA:  Fall EXAM: CT HEAD WITHOUT CONTRAST CT CERVICAL SPINE WITHOUT CONTRAST TECHNIQUE: Multidetector CT imaging of the head and cervical spine was performed following the standard protocol without intravenous contrast. Multiplanar CT image reconstructions of the cervical spine were also generated. COMPARISON:  CT brain and cervical spine 06/26/2020, 05/22/2020 FINDINGS: CT HEAD FINDINGS Brain: No acute territorial infarction, hemorrhage, or intracranial mass. Moderate atrophy. Moderate hypodensity in the white matter consistent with chronic small vessel ischemic change. Stable ventricle size. Vascular: No hyperdense vessels.  Carotid vascular calcification Skull: Normal. Negative for fracture or focal lesion. Sinuses/Orbits: Mild frothy debris in the right ethmoid sinus. Other: None CT CERVICAL SPINE FINDINGS Alignment: No subluxation.  Facet alignment within normal limits. Skull base and vertebrae: No acute fracture. No primary bone lesion or focal pathologic process. Soft tissues and spinal canal: No prevertebral fluid or swelling. No visible canal hematoma. Disc levels: Mild multiple level degenerative change. Mild facet degenerative changes at multiple levels. Upper  chest: Negative. Other: Partially visualized left subclavian stent. IMPRESSION: 1. No CT evidence for acute intracranial abnormality. Atrophy and chronic small vessel ischemic change of the white matter. 2. Degenerative changes of the cervical spine. No acute osseous abnormality. Electronically Signed   By: Donavan Foil M.D.   On: 06/26/2020 17:32   CT Head Wo Contrast  Result Date: 06/26/2020 CLINICAL DATA:  87 year old male status post fall, found down. EXAM: CT HEAD WITHOUT CONTRAST TECHNIQUE: Contiguous axial images were obtained from the base of the skull through the vertex without intravenous contrast. COMPARISON:  Head CT 05/22/2020 and earlier. FINDINGS: Brain: Cerebral volume is stable from last month. Stable gray-white matter differentiation throughout the brain. Patchy and confluent bilateral white matter hypodensity. No midline shift, ventriculomegaly, mass effect, evidence of mass lesion, intracranial hemorrhage or evidence of cortically based acute infarction. Vascular: Calcified atherosclerosis at the skull base. No suspicious intracranial vascular hyperdensity. Skull: No acute osseous abnormality identified.  Sinuses/Orbits: Trace bubbly opacity posterior right ethmoid air cell, otherwise Visualized paranasal sinuses and mastoids are clear. Other: No orbit or scalp soft tissue injury identified. Scalp vessel calcified atherosclerosis. IMPRESSION: Stable head CT. No acute intracranial abnormality or acute traumatic injury identified. Electronically Signed   By: Genevie Ann M.D.   On: 06/26/2020 10:29   CT Cervical Spine Wo Contrast  Result Date: 06/26/2020 CLINICAL DATA:  Fall EXAM: CT HEAD WITHOUT CONTRAST CT CERVICAL SPINE WITHOUT CONTRAST TECHNIQUE: Multidetector CT imaging of the head and cervical spine was performed following the standard protocol without intravenous contrast. Multiplanar CT image reconstructions of the cervical spine were also generated. COMPARISON:  CT brain and cervical  spine 06/26/2020, 05/22/2020 FINDINGS: CT HEAD FINDINGS Brain: No acute territorial infarction, hemorrhage, or intracranial mass. Moderate atrophy. Moderate hypodensity in the white matter consistent with chronic small vessel ischemic change. Stable ventricle size. Vascular: No hyperdense vessels.  Carotid vascular calcification Skull: Normal. Negative for fracture or focal lesion. Sinuses/Orbits: Mild frothy debris in the right ethmoid sinus. Other: None CT CERVICAL SPINE FINDINGS Alignment: No subluxation.  Facet alignment within normal limits. Skull base and vertebrae: No acute fracture. No primary bone lesion or focal pathologic process. Soft tissues and spinal canal: No prevertebral fluid or swelling. No visible canal hematoma. Disc levels: Mild multiple level degenerative change. Mild facet degenerative changes at multiple levels. Upper chest: Negative. Other: Partially visualized left subclavian stent. IMPRESSION: 1. No CT evidence for acute intracranial abnormality. Atrophy and chronic small vessel ischemic change of the white matter. 2. Degenerative changes of the cervical spine. No acute osseous abnormality. Electronically Signed   By: Donavan Foil M.D.   On: 06/26/2020 17:32   CT Cervical Spine Wo Contrast  Result Date: 06/26/2020 CLINICAL DATA:  87 year old male status post fall, found down. EXAM: CT CERVICAL SPINE WITHOUT CONTRAST TECHNIQUE: Multidetector CT imaging of the cervical spine was performed without intravenous contrast. Multiplanar CT image reconstructions were also generated. COMPARISON:  None. FINDINGS: Alignment: Preserved cervical lordosis. Cervicothoracic junction alignment is within normal limits. Bilateral posterior element alignment is within normal limits. Skull base and vertebrae: Osteopenia. Visualized skull base is intact. No atlanto-occipital dissociation. C1 and C2 appear intact and normally aligned. Degenerative subchondral cysts at the base of the odontoid. No acute  osseous abnormality identified. Soft tissues and spinal canal: No prevertebral fluid or swelling. No visible canal hematoma. Right lower neck dual lumen central line partially visible. Mild cervical carotid calcified atherosclerosis. Disc levels:  Mild for age cervical spine degeneration. Upper chest: Osteopenia. Grossly intact visible upper thoracic levels. Negative lung apices. Partially visible left subclavian vascular stent. IMPRESSION: No acute traumatic injury identified in the cervical spine. Electronically Signed   By: Genevie Ann M.D.   On: 06/26/2020 10:34   DG Pelvis Portable  Result Date: 06/26/2020 CLINICAL DATA:  Pain following fall EXAM: PORTABLE PELVIS 1-2 VIEWS COMPARISON:  April 24, 2015 FINDINGS: There is no evidence of pelvic fracture or dislocation. There is mild symmetric narrowing of each hip joint. No erosive change. Evidence of previous kyphoplasty at L3. IMPRESSION: No acute fracture or dislocation. Mild symmetric narrowing each hip joint. Previous kyphoplasty at L3 noted. Electronically Signed   By: Lowella Grip III M.D.   On: 06/26/2020 11:11   DG Shoulder Left  Result Date: 06/26/2020 CLINICAL DATA:  Fall EXAM: LEFT SHOULDER - 2+ VIEW COMPARISON:  None. FINDINGS: Normal alignment with approximation of the joints. No fracture or focal osseous lesion. Osteopenia. Mild acromioclavicular osteoarthrosis.  Soft tissues are within normal limits. Upper extremity vascular stent grafts. IMPRESSION: No acute osseous abnormality. Mild acromioclavicular osteoarthrosis. Electronically Signed   By: Primitivo Gauze M.D.   On: 06/26/2020 09:59    Scheduled Meds: . atorvastatin  10 mg Oral QHS  . calcium acetate  1,334 mg Oral TID WC  . heparin  5,000 Units Subcutaneous Q8H  . hydrALAZINE  25 mg Oral BID  . [START ON 06/28/2020] levothyroxine  175 mcg Oral QAC breakfast  . lisinopril  20 mg Oral Daily  . metoprolol tartrate  25 mg Oral BID  . multivitamin  1 tablet Oral Daily  .  omega-3 acid ethyl esters  2,000 mg Oral BID   Continuous Infusions: . sodium chloride    . ceFEPime (MAXIPIME) IV Stopped (06/26/20 2315)     LOS: 0 days   Time spent: 35 minutes.  Lorella Nimrod, MD [facility]  If 7PM-7AM, please contact night-coverage [email or web address]  06/27/2020, 3:04 PM   This record has been created using Systems analyst. Errors have been sought and corrected,but may not always be located. Such creation errors do not reflect on the standard of care.

## 2020-06-27 NOTE — Evaluation (Signed)
Physical Therapy Evaluation Patient Details Name: Alejandro Armstrong MRN: [id number] DOB: 1929/10/19 Today's Date: 06/27/2020   History of Present Illness  Patient is an 87 year old male who presents to the ED for weakness and reapeated falls at home. PMH includes ESRD -HD (MWF), HLD, HTN, DM non insulin dependent, hypothyroidism, ITP, MRSA (2017) with PICC line in R arm, clot formation in R arm, COVID w pneumonia 04/07/20, hypothyroidism, arthritis, kidney stones, renal agenesis, CKD stage IV.    Clinical Impression  Pt alert, hard to assess pt confusion versus HOH. Did during the session state his name, the current month, thought it was Tuesday, and when prompted stated the year was 50. He was able to follow all commands (especially visual or tactile cues) without difficulty. Pt utilized urinal prior to mobility without assistance. Supine to sit with supervision, and good sitting balance noted. Sit <> stand from EOB and from chair later in session with CGA/supervision, no physical assist needed and pt able to place hands correctly for safe transfer. He ambulated ~169ft total with RW and CGA/supervision, no LOB noted, no unsteadiness. He also perfomed stair navigation with rails and CGA. Per chart, pt has had multiple falls prior to this admission, but with RW pt without unsteadiness throughout session.  Overall the patient demonstrated mild deficits (see "PT Problem List") that impede the patient's functional abilities, safety, and mobility and would benefit from skilled PT intervention. Recommendation is HHPT with supervision/assistance 24/7 to address confusion/cognition deficits. If unable to obtain 24/7 assistance, pt may benefit from SNF to maximize safety and independence.     Follow Up Recommendations Home health PT;Supervision/Assistance - 24 hour    Equipment Recommendations  Rolling walker with 5" wheels    Recommendations for Other Services       Precautions / Restrictions  Precautions Precautions: Fall Restrictions Weight Bearing Restrictions: No      Mobility  Bed Mobility Overal bed mobility: Needs Assistance Bed Mobility: Supine to Sit     Supine to sit: HOB elevated;Supervision     General bed mobility comments: cued to transfer to EOB, pt able to complete without assistance    Transfers Overall transfer level: Needs assistance Equipment used: Rolling walker (2 wheeled) Transfers: Sit to/from Stand Sit to Stand: Supervision         General transfer comment: pt with proper hand placement during transfer  Ambulation/Gait Ambulation/Gait assistance: Supervision;Min guard Gait Distance (Feet): 150 Feet Assistive device: Rolling walker (2 wheeled)   Gait velocity: decreased   General Gait Details: Pt able to ambulate to rehab gym, perform stair navigation, have a seated rest break, and then return to room. mild fatigue noted but pt able to complete without complaint, no LOB noted.  Stairs Stairs: Yes Stairs assistance: Min guard Stair Management: Two rails;Alternating pattern Number of Stairs: 4 General stair comments: no LOB noted, pt took extended time to complete but steady.  Wheelchair Mobility    Modified Rankin (Stroke Patients Only)       Balance Overall balance assessment: Needs assistance Sitting-balance support: Feet supported Sitting balance-Leahy Scale: Good       Standing balance-Leahy Scale: Poor Standing balance comment: at least unilateral UE support throughout mobility to maximize safety                             Pertinent Vitals/Pain Pain Assessment: Faces Faces Pain Scale: No hurt    Home Living Family/patient expects to be  discharged to:: Private residence Living Arrangements: Alone   Type of Home: House Home Access: Stairs to enter Entrance Stairs-Rails: Right Entrance Stairs-Number of Steps: 4 Home Layout: One level Home Equipment: [place] - 2 wheels Additional Comments:  PLOF majority gathered from previous hospital admissions. Pt with some confusion, HOH, but able to state his name, month    Prior Function Level of Independence: Needs assistance               Hand Dominance   Dominant Hand: Right    Extremity/Trunk Assessment   Upper Extremity Assessment Upper Extremity Assessment: Generalized weakness    Lower Extremity Assessment Lower Extremity Assessment: Generalized weakness    Cervical / Trunk Assessment Cervical / Trunk Assessment: Kyphotic  Communication   Communication: HOH  Cognition Arousal/Alertness: Awake/alert Behavior During Therapy: Restless Overall Cognitive Status: Difficult to assess                                 General Comments: Pt able to report his name, HOH but able to report the month. disoriened to place, and year. able to follow commands with tactile/visual cueing due to [place] Comments      Exercises Other Exercises Other Exercises: Pt able to utilize urinal in bed without assistance, requested use prior to mobility   Assessment/Plan    PT Assessment Patient needs continued PT services  PT Problem List Decreased strength;Decreased mobility;Decreased activity tolerance;Decreased balance;Decreased cognition;Decreased safety awareness       PT Treatment Interventions DME instruction;Therapeutic exercise;Gait training;Balance training;Stair training;Functional mobility training;Neuromuscular re-education;Therapeutic activities;Patient/family education    PT Goals (Current goals can be found in the Care Plan section)  Acute Rehab PT Goals Patient Stated Goal: to get up and go PT Goal Formulation: With patient Time For Goal Achievement: 07/11/20 Potential to Achieve Goals: Good    Frequency Min 2X/week   Barriers to discharge        Co-evaluation               AM-PAC PT "6 Clicks" Mobility  Outcome Measure Help needed turning from your back to your side while  in a flat bed without using bedrails?: None Help needed moving from lying on your back to sitting on the side of a flat bed without using bedrails?: None Help needed moving to and from a bed to a chair (including a wheelchair)?: None Help needed standing up from a chair using your arms (e.g., wheelchair or bedside chair)?: None Help needed to walk in hospital room?: A Little Help needed climbing 3-5 steps with a railing? : A Little 6 Click Score: 22    End of Session Equipment Utilized During Treatment: Gait belt Activity Tolerance: Patient tolerated treatment well Patient left: in chair;with chair alarm set;with call bell/phone within reach Nurse Communication: Mobility status PT Visit Diagnosis: Other abnormalities of gait and mobility (R26.89);Muscle weakness (generalized) (M62.81);Difficulty in walking, not elsewhere classified (R26.2);History of falling (Z91.81)    Time: 8811-0315 PT Time Calculation (min) (ACUTE ONLY): 26 min   Charges:   PT Evaluation $PT Eval Low Complexity: 1 Low PT Treatments $Gait Training: 23-37 mins        Lieutenant Diego PT, DPT 11:51 AM,06/27/20

## 2020-06-27 NOTE — Plan of Care (Signed)

## 2020-06-28 ENCOUNTER — Ambulatory Visit (INDEPENDENT_AMBULATORY_CARE_PROVIDER_SITE_OTHER): Payer: Medicare HMO | Admitting: Vascular Surgery

## 2020-06-28 DIAGNOSIS — W19XXXA Unspecified fall, initial encounter: Secondary | ICD-10-CM | POA: Diagnosis present

## 2020-06-28 DIAGNOSIS — Z9115 Patient's noncompliance with renal dialysis: Secondary | ICD-10-CM | POA: Diagnosis not present

## 2020-06-28 DIAGNOSIS — D693 Immune thrombocytopenic purpura: Secondary | ICD-10-CM | POA: Diagnosis present

## 2020-06-28 DIAGNOSIS — Z20822 Contact with and (suspected) exposure to covid-19: Secondary | ICD-10-CM | POA: Diagnosis present

## 2020-06-28 DIAGNOSIS — Z66 Do not resuscitate: Secondary | ICD-10-CM | POA: Diagnosis present

## 2020-06-28 DIAGNOSIS — E039 Hypothyroidism, unspecified: Secondary | ICD-10-CM | POA: Diagnosis present

## 2020-06-28 DIAGNOSIS — I120 Hypertensive chronic kidney disease with stage 5 chronic kidney disease or end stage renal disease: Secondary | ICD-10-CM | POA: Diagnosis present

## 2020-06-28 DIAGNOSIS — E785 Hyperlipidemia, unspecified: Secondary | ICD-10-CM | POA: Diagnosis present

## 2020-06-28 DIAGNOSIS — E1122 Type 2 diabetes mellitus with diabetic chronic kidney disease: Secondary | ICD-10-CM | POA: Diagnosis present

## 2020-06-28 DIAGNOSIS — R001 Bradycardia, unspecified: Secondary | ICD-10-CM | POA: Diagnosis not present

## 2020-06-28 DIAGNOSIS — Z7189 Other specified counseling: Secondary | ICD-10-CM | POA: Diagnosis not present

## 2020-06-28 DIAGNOSIS — B965 Pseudomonas (aeruginosa) (mallei) (pseudomallei) as the cause of diseases classified elsewhere: Secondary | ICD-10-CM | POA: Diagnosis present

## 2020-06-28 DIAGNOSIS — N2581 Secondary hyperparathyroidism of renal origin: Secondary | ICD-10-CM | POA: Diagnosis present

## 2020-06-28 DIAGNOSIS — Z1623 Resistance to quinolones and fluoroquinolones: Secondary | ICD-10-CM | POA: Diagnosis present

## 2020-06-28 DIAGNOSIS — N186 End stage renal disease: Secondary | ICD-10-CM | POA: Diagnosis present

## 2020-06-28 DIAGNOSIS — D631 Anemia in chronic kidney disease: Secondary | ICD-10-CM | POA: Diagnosis present

## 2020-06-28 DIAGNOSIS — Z515 Encounter for palliative care: Secondary | ICD-10-CM | POA: Diagnosis not present

## 2020-06-28 DIAGNOSIS — Q602 Renal agenesis, unspecified: Secondary | ICD-10-CM | POA: Diagnosis not present

## 2020-06-28 DIAGNOSIS — F05 Delirium due to known physiological condition: Secondary | ICD-10-CM | POA: Diagnosis not present

## 2020-06-28 DIAGNOSIS — H9190 Unspecified hearing loss, unspecified ear: Secondary | ICD-10-CM | POA: Diagnosis present

## 2020-06-28 DIAGNOSIS — Z961 Presence of intraocular lens: Secondary | ICD-10-CM | POA: Diagnosis present

## 2020-06-28 DIAGNOSIS — R4182 Altered mental status, unspecified: Secondary | ICD-10-CM | POA: Diagnosis present

## 2020-06-28 DIAGNOSIS — F0390 Unspecified dementia without behavioral disturbance: Secondary | ICD-10-CM | POA: Diagnosis present

## 2020-06-28 DIAGNOSIS — R451 Restlessness and agitation: Secondary | ICD-10-CM | POA: Diagnosis not present

## 2020-06-28 DIAGNOSIS — N390 Urinary tract infection, site not specified: Secondary | ICD-10-CM | POA: Diagnosis present

## 2020-06-28 DIAGNOSIS — R296 Repeated falls: Secondary | ICD-10-CM | POA: Diagnosis present

## 2020-06-28 LAB — T3: T3, Total: 33 ng/dL — ABNORMAL LOW (ref 71–180)

## 2020-06-28 LAB — T3, FREE: T3, Free: 0.8 pg/mL — ABNORMAL LOW (ref 2.0–4.4)

## 2020-06-28 LAB — BASIC METABOLIC PANEL
Anion gap: 14 (ref 5–15)
BUN: 36 mg/dL — ABNORMAL HIGH (ref 8–23)
CO2: 22 mmol/L (ref 22–32)
Calcium: 10.1 mg/dL (ref 8.9–10.3)
Chloride: 102 mmol/L (ref 98–111)
Creatinine, Ser: 7.69 mg/dL — ABNORMAL HIGH (ref 0.61–1.24)
GFR, Estimated: 6 mL/min — ABNORMAL LOW (ref >60)
Glucose, Bld: 91 mg/dL (ref 70–99)
Potassium: 4.1 mmol/L (ref 3.5–5.1)
Sodium: 138 mmol/L (ref 135–145)

## 2020-06-28 LAB — T4: T4, Total: 1.7 ug/dL — ABNORMAL LOW (ref 4.5–12.0)

## 2020-06-28 MED ORDER — METOPROLOL TARTRATE 25 MG PO TABS
25.0000 mg | ORAL_TABLET | Freq: Two times a day (BID) | ORAL | Status: DC
  Administered 2020-06-29 – 2020-07-01 (×4): 25 mg via ORAL
  Filled 2020-06-28 (×9): qty 1

## 2020-06-28 MED ORDER — CHLORHEXIDINE GLUCONATE CLOTH 2 % EX PADS
6.0000 | MEDICATED_PAD | Freq: Every day | CUTANEOUS | Status: DC
  Administered 2020-06-30 – 2020-07-04 (×4): 6 via TOPICAL

## 2020-06-28 MED ORDER — LORAZEPAM 2 MG/ML IJ SOLN
0.5000 mg | Freq: Four times a day (QID) | INTRAMUSCULAR | Status: DC | PRN
  Administered 2020-06-28 – 2020-06-30 (×4): 0.5 mg via INTRAVENOUS
  Filled 2020-06-28 (×5): qty 1

## 2020-06-28 MED ORDER — QUETIAPINE FUMARATE 25 MG PO TABS
50.0000 mg | ORAL_TABLET | Freq: Every day | ORAL | Status: DC
  Administered 2020-06-29 – 2020-07-04 (×3): 50 mg via ORAL
  Filled 2020-06-28 (×6): qty 2

## 2020-06-28 NOTE — Progress Notes (Signed)
Occupational Therapy Treatment Patient Details Name: Alejandro Armstrong MRN: [id number] DOB: 01-15-1930 Today's Date: 06/28/2020    History of present illness Patient is an 87 year old male who presents to the ED for weakness and reapeated falls at home. PMH includes ESRD -HD (MWF), HLD, HTN, DM non insulin dependent, hypothyroidism, ITP, MRSA (2017) with PICC line in R arm, clot formation in R arm, COVID w pneumonia 04/07/20, hypothyroidism, arthritis, kidney stones, renal agenesis, CKD stage IV.   OT comments  Pt seen for brief OT treatment this date. Pt initially sleeping, requiring increasing verbal cues and light tactile cues to wake. Once awake, pt says hello to therapist and reaching hand out for handshake. Pt engaged in light chitchat but unable to engage meaningfully in conversation, expressing needs, and answering simple questions. Difficulty following commands (cognition vs profoundly HOH). Pt attempted washing face with L hand after set up of washcloth in hand, verbal/visual cues to demonstrate, and hand over hand assist to initiate. Pt briefly washed L cheek before attempting to pull both arms under the covers, ultimately requiring therapist to provide max assist to complete task. Pt tolerated the warm washcloth on his face well. Additional blanket obtained for pt. Attempted self feeding but when presented with cup, pt stated "no, no." Pt demonstrated decreased cognition this date, limiting safe participation in session. Will continue to progress towards OT goals. SNF and 24/7 sup/assist remains most appropriate at this time.    Follow Up Recommendations  Supervision/Assistance - 24 hour;SNF    Equipment Recommendations  3 in 1 bedside commode    Recommendations for Other Services      Precautions / Restrictions Precautions Precautions: Fall Restrictions Weight Bearing Restrictions: No       Mobility Bed Mobility Overal bed mobility: Needs Assistance Bed Mobility: Supine  to Sit;Sit to Supine     Supine to sit: Min assist;HOB elevated Sit to supine: Min guard;HOB elevated   General bed mobility comments: deferred 2/2 cognition/inability to follow simple commands  Transfers Overall transfer level: Needs assistance Equipment used: Rolling walker (2 wheeled) Transfers: Sit to/from Stand Sit to Stand: Min assist;+2 safety/equipment         General transfer comment: deferred 2/2 cognition/inability to follow simple commands    Balance Overall balance assessment: Needs assistance Sitting-balance support: Feet supported Sitting balance-Leahy Scale: Poor Sitting balance - Comments: reliant on UE propping or PT assist to maintain balance     Standing balance-Leahy Scale: Poor Standing balance comment: reliant on RW support or PT support to maintain balance                           ADL either performed or assessed with clinical judgement   ADL Overall ADL's : Needs assistance/impaired   Eating/Feeding Details (indicate cue type and reason): pt offered food/drink with verbal and visual cues; pt refused drink/food Grooming: Wash/dry face;Bed level Grooming Details (indicate cue type and reason): pt attempted washing face with L hand after verbal/visual cues and hand over hand assist to initiate, pt briefly washed L cheek before attempting to pull both arms under the covers                                     Vision Patient Visual Report: No change from baseline     Perception     Praxis  Cognition Arousal/Alertness: Awake/alert Behavior During Therapy: Restless;Impulsive Overall Cognitive Status: No family/caregiver present to determine baseline cognitive functioning                                 General Comments: pt engaged in "chit chat" conversation briefly but unable to answer questions or otherwise follow commands without heavy cueing and hand over hand assist; also profoundly HOH per  chart        Exercises     Shoulder Instructions       General Comments      Pertinent Vitals/ Pain       Pain Assessment: Faces Faces Pain Scale: Hurts a little bit Pain Location: pt grimaced slightly when therapist attempted to support L elbow (old bruising noted) Pain Descriptors / Indicators: Grimacing;Moaning Pain Intervention(s): Limited activity within patient's tolerance;Monitored during session;Repositioned  Home Living                                          Prior Functioning/Environment              Frequency  Min 1X/week        Progress Toward Goals  OT Goals(current goals can now be found in the care plan section)  Progress towards OT goals: Not progressing toward goals - comment (pt more confused this date)  Acute Rehab OT Goals Patient Stated Goal: pt unable to state OT Goal Formulation: With patient Time For Goal Achievement: 07/11/20 Potential to Achieve Goals: Good  Plan Discharge plan remains appropriate;Frequency remains appropriate    Co-evaluation                 AM-PAC OT "6 Clicks" Daily Activity     Outcome Measure     Help from another person taking care of personal grooming?: A Lot Help from another person toileting, which includes using toliet, bedpan, or urinal?: A Lot Help from another person bathing (including washing, rinsing, drying)?: A Lot Help from another person to put on and taking off regular upper body clothing?: A Lot Help from another person to put on and taking off regular lower body clothing?: A Lot 6 Click Score: 10    End of Session    OT Visit Diagnosis: Other abnormalities of gait and mobility (R26.89);Muscle weakness (generalized) (M62.81)   Activity Tolerance Other (comment) (cognition)   Patient Left in bed;with call bell/phone within reach;with bed alarm set   Nurse Communication          Time: 5537-4827 OT Time Calculation (min): 8 min  Charges: OT General  Charges $OT Visit: 1 Visit OT Treatments $Self Care/Home Management : 8-22 mins  Jeni Salles, MPH, MS, OTR/L ascom [phone] 06/28/20, 3:13 PM

## 2020-06-28 NOTE — Progress Notes (Signed)
[facility], [place] 06/28/20  Subjective:   LOS: 0 1/13- Patient known to our practice from previous admissions and from outpatient dialysis.  Currently dialyzes at [facility] dialysis center.  Last treatment on 25 June 2020.  This time presents from [facility] via EMS after multiple falls.  Seen in the room this morning.  Very agitated and trying to climb out of bed.  Admitted under observation.     Objective:  Vital signs in last 24 hours:  Temp:  [97.6 F (36.4 C)-98.3 F (36.8 C)] 98.2 F (36.8 C) (01/13 1519) Pulse Rate:  [47-104] 102 (01/13 1519) Resp:  [16-20] 20 (01/13 1519) BP: (125-186)/(80-92) 146/82 (01/13 1519) SpO2:  [94 %-98 %] 98 % (01/13 1519)  Weight change:  Filed Weights   06/27/20 0329  Weight: 70.1 kg    Intake/Output:    Intake/Output Summary (Last 24 hours) at 06/28/2020 1609 Last data filed at 06/28/2020 1400 Gross per 24 hour  Intake 360 ml  Output --  Net 360 ml    Physical Exam: General:  Frail, elderly, agitated  HEENT  anicteric, moist oral mucous membrane  Pulm/lungs  normal breathing effort, lungs are clear to auscultation  CVS/Heart  irregular rhythm, no rub or gallop  Abdomen:   Soft, nontender  Extremities:  No peripheral edema  Neurologic:  Alert, oriented, able to follow commands  Skin:  No acute rashes  Right IJ PermCath  Basic Metabolic Panel:  Recent Labs  Lab 06/26/20 1544 06/27/20 0330 06/28/20 0157  NA 134* 139 138  K 3.9 4.1 4.1  CL 95* 101 102  CO2 25 26 22   GLUCOSE 89 76 91  BUN 23 27* 36*  CREATININE 5.90* 6.57* 7.69*  CALCIUM 9.6 9.3 10.1     CBC: Recent Labs  Lab 06/26/20 1544 06/27/20 0330  WBC 7.7 5.3  NEUTROABS 5.3  --   HGB 10.8* 10.4*  HCT 32.5* 32.0*  MCV 99.7 100.0  PLT 190 158      Lab Results  Component Value Date   HEPBSAG NON REACTIVE 04/13/2020   HEPBIGM NON REACTIVE 04/13/2020      Microbiology:  Recent Results (from the past 240  hour(s))  SARS CORONAVIRUS 2 (TAT 6-24 HRS) Nasopharyngeal Nasopharyngeal Swab     Status: None   Collection Time: 06/26/20  4:53 PM   Specimen: Nasopharyngeal Swab  Result Value Ref Range Status   SARS Coronavirus 2 NEGATIVE NEGATIVE Final    Comment: (NOTE) SARS-CoV-2 target nucleic acids are NOT DETECTED.  The SARS-CoV-2 RNA is generally detectable in upper and lower respiratory specimens during the acute phase of infection. Negative results do not preclude SARS-CoV-2 infection, do not rule out co-infections with other pathogens, and should not be used as the sole basis for treatment or other patient management decisions. Negative results must be combined with clinical observations, patient history, and epidemiological information. The expected result is Negative.  Fact Sheet for Patients: SugarRoll.be  Fact Sheet for Healthcare Providers: https://www.woods-mathews.com/  This test is not yet approved or cleared by the [place] FDA and  has been authorized for detection and/or diagnosis of SARS-CoV-2 by FDA under an Emergency Use Authorization (EUA). This EUA will remain  in effect (meaning this test can be used) for the duration of the COVID-19 declaration under Se ction 564(b)(1) of the Act, 21 U.S.C. section 360bbb-3(b)(1), unless the authorization is terminated or revoked sooner.  Performed at [facility], [place],  [place] [address]   Urine culture     Status: Abnormal (Preliminary result)   Collection Time: 06/26/20  4:53 PM   Specimen: Urine, Random  Result Value Ref Range Status   Specimen Description   Final    URINE, RANDOM Performed at [facility], [address] [place], [place] [address]    Special Requests   Final    NONE Performed at [facility], [address] [place], [place] [address]    Culture (A)  Final    >=100,000 COLONIES/mL PSEUDOMONAS  AERUGINOSA SUSCEPTIBILITIES TO FOLLOW Performed at [facility], [place] [address], [place], [place] [address]    Report Status PENDING  Incomplete    Coagulation Studies: No results for input(s): LABPROT, INR in the last 72 hours.  Urinalysis: Recent Labs    06/26/20 1653  COLORURINE YELLOW*  LABSPEC 1.009  PHURINE 8.0  GLUCOSEU NEGATIVE  HGBUR SMALL*  BILIRUBINUR NEGATIVE  KETONESUR 5*  PROTEINUR 100*  NITRITE POSITIVE*  LEUKOCYTESUR LARGE*      Imaging: CT Head Wo Contrast  Result Date: 06/26/2020 CLINICAL DATA:  Fall EXAM: CT HEAD WITHOUT CONTRAST CT CERVICAL SPINE WITHOUT CONTRAST TECHNIQUE: Multidetector CT imaging of the head and cervical spine was performed following the standard protocol without intravenous contrast. Multiplanar CT image reconstructions of the cervical spine were also generated. COMPARISON:  CT brain and cervical spine 06/26/2020, 05/22/2020 FINDINGS: CT HEAD FINDINGS Brain: No acute territorial infarction, hemorrhage, or intracranial mass. Moderate atrophy. Moderate hypodensity in the white matter consistent with chronic small vessel ischemic change. Stable ventricle size. Vascular: No hyperdense vessels.  Carotid vascular calcification Skull: Normal. Negative for fracture or focal lesion. Sinuses/Orbits: Mild frothy debris in the right ethmoid sinus. Other: None CT CERVICAL SPINE FINDINGS Alignment: No subluxation.  Facet alignment within normal limits. Skull base and vertebrae: No acute fracture. No primary bone lesion or focal pathologic process. Soft tissues and spinal canal: No prevertebral fluid or swelling. No visible canal hematoma. Disc levels: Mild multiple level degenerative change. Mild facet degenerative changes at multiple levels. Upper chest: Negative. Other: Partially visualized left subclavian stent. IMPRESSION: 1. No CT evidence for acute intracranial abnormality. Atrophy and chronic small vessel ischemic change of the white matter. 2.  Degenerative changes of the cervical spine. No acute osseous abnormality. Electronically Signed   By: Donavan Foil M.D.   On: 06/26/2020 17:32   CT Cervical Spine Wo Contrast  Result Date: 06/26/2020 CLINICAL DATA:  Fall EXAM: CT HEAD WITHOUT CONTRAST CT CERVICAL SPINE WITHOUT CONTRAST TECHNIQUE: Multidetector CT imaging of the head and cervical spine was performed following the standard protocol without intravenous contrast. Multiplanar CT image reconstructions of the cervical spine were also generated. COMPARISON:  CT brain and cervical spine 06/26/2020, 05/22/2020 FINDINGS: CT HEAD FINDINGS Brain: No acute territorial infarction, hemorrhage, or intracranial mass. Moderate atrophy. Moderate hypodensity in the white matter consistent with chronic small vessel ischemic change. Stable ventricle size. Vascular: No hyperdense vessels.  Carotid vascular calcification Skull: Normal. Negative for fracture or focal lesion. Sinuses/Orbits: Mild frothy debris in the right ethmoid sinus. Other: None CT CERVICAL SPINE FINDINGS Alignment: No subluxation.  Facet alignment within normal limits. Skull base and vertebrae: No acute fracture. No primary bone lesion or focal pathologic process. Soft tissues and spinal canal: No prevertebral fluid or swelling. No visible canal hematoma. Disc levels: Mild multiple level degenerative change. Mild facet degenerative changes at multiple levels. Upper chest: Negative. Other: Partially visualized left subclavian stent. IMPRESSION: 1.  No CT evidence for acute intracranial abnormality. Atrophy and chronic small vessel ischemic change of the white matter. 2. Degenerative changes of the cervical spine. No acute osseous abnormality. Electronically Signed   By: Donavan Foil M.D.   On: 06/26/2020 17:32     Medications:   . sodium chloride    . ceFEPime (MAXIPIME) IV Stopped (06/28/20 0101)   . atorvastatin  10 mg Oral QHS  . calcium acetate  1,334 mg Oral TID WC  . heparin  5,000  Units Subcutaneous Q8H  . hydrALAZINE  25 mg Oral BID  . levothyroxine  175 mcg Oral QAC breakfast  . lisinopril  20 mg Oral Daily  . multivitamin  1 tablet Oral Daily  . omega-3 acid ethyl esters  2,000 mg Oral BID   sodium chloride, acetaminophen **OR** acetaminophen, albuterol, haloperidol lactate, hydrALAZINE, lidocaine-prilocaine, ondansetron **OR** ondansetron (ZOFRAN) IV, polyvinyl alcohol  Assessment/ Plan:  87 y.o. male with medical history of ESRD on HD MWF, hyperlipidemia, hypertension, diabetes mellitus type 2, hypothyroidism, ITP, history of MRSA bacteremia, anemia of chronic kidney disease, secondary hyperparathyroidism, generalized debility, history of Covid pneumonia  was admitted on 06/26/2020 for  Principal Problem:   Multiple falls Active Problems:   ESRD (end stage renal disease) on dialysis [facility])   Essential hypertension   Hypothyroidism   Recurrent UTI   Anemia in ESRD (end-stage renal disease) ([place])  Lower urinary tract infectious disease [N39.0] Multiple falls [R29.6] Fall, initial encounter [W19.[facility] dialysis/MWF/right IJ PermCath/[facility]  #.  Altered mental status, status post fall # ESRD Altered mental status is unlikely due to uremia Last outpatient hemodialysis treatment was on January 10.  Electrolytes and volume status are acceptable at present.  No acute indication for dialysis.  If patient is still hospitalized, we will set up his dialysis for Friday.  #. Anemia of CKD  Lab Results  Component Value Date   HGB 10.4 (L) 06/27/2020   Low dose EPO with HD  #. Secondary hyperparathyroidism of renal origin N 25.81      Component Value Date/Time   PTH 132 (H) 04/12/2020 1740   Lab Results  Component Value Date   PHOS 2.3 (L) 05/24/2020   Monitor calcium and phos level during this admission   #. Diabetes type 2 with CKD Hgb A1c MFr Bld (%)  Date Value  04/14/2020 6.9 (H)   #Status post fall Physical therapy evaluation  January 13 suggests SNF placement    LOS: 0 Alejandro Armstrong 1/13/20224:09 [place], [place]

## 2020-06-28 NOTE — Progress Notes (Signed)
PROGRESS NOTE    Alejandro Armstrong  LHT:[id number] DOB: [birth year] DOA: 06/26/2020 PCP: Casilda Carls, MD   Brief Narrative: Taken from H&P Alejandro Armstrong is a 87 y.o. male with medical history significant for dementia, ESRD on hemodialysis, hypertension, and hypothyroidism, presenting to the emergency department for evaluation of generalized weakness and recurrent falls.  History is primarily obtained from the patient's wife who reports that the patient has been increasingly weak in general, fell twice today which is unusual for him, and she is unable to care for him at their retirement community.  Patient had similar symptoms last month for which she was admitted to the hospital, diagnosed with UTI, discharged to SNF, and had improved.  He has now developed recurrent general weakness, fell this morning, was evaluated in the emergency department and not found to have any significant injury or obvious indication for hospital admission at that time, but went to see an outpatient physician where he fell again.   Imaging without any significant abnormality, labs with markedly elevated TSH and UA  was positive for pyuria and nitrites, urine cultures pending.  Subjective: Patient was trying to get out of bed at that time, easily redirectable. There was some nursing note about stating one-on-one sitter, patient never required any sitter, he does become agitated pulling his line intermittently which is very common in his age group with advanced dementia.  He was having frequent safety checks.  Assessment & Plan:   Principal Problem:   Multiple falls Active Problems:   ESRD (end stage renal disease) on dialysis [facility])   Essential hypertension   Hypothyroidism   Recurrent UTI   Anemia in ESRD (end-stage renal disease) (HCC)  Generalized weakness/frequent falls.  UTI can be contributory.  Patient was recently sent home from rehab.  Family wants to put him back to rehab and willing to pay  privately as unable to take care of him at home. Talked with caregiver and wife and patient is refusing to go to dialysis and medications at home. -PT recommended 24/7 care with home health. -Family is trying to put him to [facility] with private pay, apparently [facility] saw and nursing note stating that he is needing one-to-one sitter which was wrong but they refused to take him until 48 hours without any intervention.  Patient has advanced dementia and sundowning is very common in this population group which can cause some agitation and pulling out lines requiring little bit more attention. -Add Seroquel for sundowning at night.  UTI.  UA with persistent pyuria and nitrites.  Recent urine culture with Pseudomonas so he was started on cefepime.  Remained afebrile and no leukocytosis.  Could not explain any urinary symptoms. - urine cultures with Pseudomonas aeruginosa-pending susceptibility. -Continue with cefepime for now  Hypothyroidism.  TSH came back markedly elevated at 150, repeat 154.  It was within normal limit 2 months ago.  Talked with wife and caregiver and apparently he was not taking his thyroid medications properly.  T3 and T4 for levels including total and free all are decreased. -Increase the dose of Synthroid to 175 MCG. -He will need a repeat TSH in 4 weeks.  ESRD.  Family is struggling with him to go for dialysis regularly, last dialysis was on Monday.  No acute indication for urgent dialysis.  Patient normally gets dialysis Monday, Wednesday and Friday. -Nephrology was consulted.  Hypertension.  Blood pressure mildly elevated. -Continue with lisinopril and hydralazine. -Restart Lopressor as heart rate is going in  low 100s now  Bradycardia.  Resolved rather heart rate in low 100s now after holding Lopressor. -Restart Lopressor and monitor  Dementia. -Continue with supportive care and delirium precautions.  Anemia of chronic disease.  Hemoglobin  stable. -Continue to monitor.  Objective: Vitals:   06/28/20 0036 06/28/20 0755 06/28/20 1138 06/28/20 1519  BP: (!) 164/81 125/85 132/80 (!) 146/82  Pulse: (!) 48 (!) 104 (!) 103 (!) 102  Resp:  20 20 20   Temp: 97.9 F (36.6 C) 97.6 F (36.4 C) 97.8 F (36.6 C) 98.2 F (36.8 C)  TempSrc:   Oral Oral  SpO2: 96% 98% 97% 98%  Weight:      Height:        Intake/Output Summary (Last 24 hours) at 06/28/2020 1619 Last data filed at 06/28/2020 1400 Gross per 24 hour  Intake 360 ml  Output --  Net 360 ml   Filed Weights   06/27/20 0329  Weight: 70.1 kg    Examination:  General.  Hard of hearing elderly man, in no acute distress. Pulmonary.  Lungs clear bilaterally, normal respiratory effort. CV.  Regular rate and rhythm, no JVD, rub or murmur. Abdomen.  Soft, nontender, nondistended, BS positive. CNS.  Alert and oriented x3.  No focal neurologic deficit. Extremities.  No edema, no cyanosis, pulses intact and symmetrical. Psychiatry.  Judgment and insight appears impaired.  DVT prophylaxis: Heparin Code Status: DNR Family Communication: Talked with wife  on phone Disposition Plan:  Status is: Observation  Dispo: The patient is from: Home              Anticipated d/c is to: SNF              Anticipated d/c date is: 1 day              Patient currently is medically stable for discharge.  Unsafe discharge planning as SNF refused to take him today stating that he is requiring a sitter which was not right.  Patient was having some sundowning effect which is common this population of patients.  He can go to SNF once they agreed to take him.   Consultants:   Nephrology  Procedures:  Antimicrobials:  Cefepime  Data Reviewed: I have personally reviewed following labs and imaging studies  CBC: Recent Labs  Lab 06/26/20 1544 06/27/20 0330  WBC 7.7 5.3  NEUTROABS 5.3  --   HGB 10.8* 10.4*  HCT 32.5* 32.0*  MCV 99.7 100.0  PLT 190 536   Basic Metabolic  Panel: Recent Labs  Lab 06/26/20 1544 06/27/20 0330 06/28/20 0157  NA 134* 139 138  K 3.9 4.1 4.1  CL 95* 101 102  CO2 25 26 22   GLUCOSE 89 76 91  BUN 23 27* 36*  CREATININE 5.90* 6.57* 7.69*  CALCIUM 9.6 9.3 10.1   GFR: Estimated Creatinine Clearance: 6.2 mL/min (A) (by C-G formula based on SCr of 7.69 mg/dL (H)). Liver Function Tests: No results for input(s): AST, ALT, ALKPHOS, BILITOT, PROT, ALBUMIN in the last 168 hours. No results for input(s): LIPASE, AMYLASE in the last 168 hours. No results for input(s): AMMONIA in the last 168 hours. Coagulation Profile: No results for input(s): INR, PROTIME in the last 168 hours. Cardiac Enzymes: Recent Labs  Lab 06/27/20 0330  CKTOTAL 132   BNP (last 3 results) No results for input(s): PROBNP in the last 8760 hours. HbA1C: No results for input(s): HGBA1C in the last 72 hours. CBG: No results for input(s): GLUCAP in  the last 168 hours. Lipid Profile: No results for input(s): CHOL, HDL, LDLCALC, TRIG, CHOLHDL, LDLDIRECT in the last 72 hours. Thyroid Function Tests: Recent Labs    06/27/20 0330  TSH 154.551*  150.000*  T4TOTAL 1.7*  FREET4 0.30*  T3FREE 0.8*   Anemia Panel: No results for input(s): VITAMINB12, FOLATE, FERRITIN, TIBC, IRON, RETICCTPCT in the last 72 hours. Sepsis Labs: No results for input(s): PROCALCITON, LATICACIDVEN in the last 168 hours.  Recent Results (from the past 240 hour(s))  SARS CORONAVIRUS 2 (TAT 6-24 HRS) Nasopharyngeal Nasopharyngeal Swab     Status: None   Collection Time: 06/26/20  4:53 PM   Specimen: Nasopharyngeal Swab  Result Value Ref Range Status   SARS Coronavirus 2 NEGATIVE NEGATIVE Final    Comment: (NOTE) SARS-CoV-2 target nucleic acids are NOT DETECTED.  The SARS-CoV-2 RNA is generally detectable in upper and lower respiratory specimens during the acute phase of infection. Negative results do not preclude SARS-CoV-2 infection, do not rule out co-infections with other  pathogens, and should not be used as the sole basis for treatment or other patient management decisions. Negative results must be combined with clinical observations, patient history, and epidemiological information. The expected result is Negative.  Fact Sheet for Patients: SugarRoll.be  Fact Sheet for Healthcare Providers: https://www.woods-mathews.com/  This test is not yet approved or cleared by the [place] FDA and  has been authorized for detection and/or diagnosis of SARS-CoV-2 by FDA under an Emergency Use Authorization (EUA). This EUA will remain  in effect (meaning this test can be used) for the duration of the COVID-19 declaration under Se ction 564(b)(1) of the Act, 21 U.S.C. section 360bbb-3(b)(1), unless the authorization is terminated or revoked sooner.  Performed at [facility], [place] [address] [place], [place] [address]   Urine culture     Status: Abnormal (Preliminary result)   Collection Time: 06/26/20  4:53 PM   Specimen: Urine, Random  Result Value Ref Range Status   Specimen Description   Final    URINE, RANDOM Performed at [facility], [address] [place], [place] [address]    Special Requests   Final    NONE Performed at [facility], [address] [place], [place] [address]    Culture (A)  Final    >=100,000 COLONIES/mL PSEUDOMONAS AERUGINOSA SUSCEPTIBILITIES TO FOLLOW Performed at [facility], [place] [address], [place], [place] [address]    Report Status PENDING  Incomplete     Radiology Studies: CT Head Wo Contrast  Result Date: 06/26/2020 CLINICAL DATA:  Fall EXAM: CT HEAD WITHOUT CONTRAST CT CERVICAL SPINE WITHOUT CONTRAST TECHNIQUE: Multidetector CT imaging of the head and cervical spine was performed following the standard protocol without intravenous contrast. Multiplanar CT image reconstructions of the cervical spine were also generated. COMPARISON:  CT  brain and cervical spine 06/26/2020, 05/22/2020 FINDINGS: CT HEAD FINDINGS Brain: No acute territorial infarction, hemorrhage, or intracranial mass. Moderate atrophy. Moderate hypodensity in the white matter consistent with chronic small vessel ischemic change. Stable ventricle size. Vascular: No hyperdense vessels.  Carotid vascular calcification Skull: Normal. Negative for fracture or focal lesion. Sinuses/Orbits: Mild frothy debris in the right ethmoid sinus. Other: None CT CERVICAL SPINE FINDINGS Alignment: No subluxation.  Facet alignment within normal limits. Skull base and vertebrae: No acute fracture. No primary bone lesion or focal pathologic process. Soft tissues and spinal canal: No prevertebral fluid or swelling. No visible canal hematoma. Disc levels: Mild multiple level degenerative change.  Mild facet degenerative changes at multiple levels. Upper chest: Negative. Other: Partially visualized left subclavian stent. IMPRESSION: 1. No CT evidence for acute intracranial abnormality. Atrophy and chronic small vessel ischemic change of the white matter. 2. Degenerative changes of the cervical spine. No acute osseous abnormality. Electronically Signed   By: Donavan Foil M.D.   On: 06/26/2020 17:32   CT Cervical Spine Wo Contrast  Result Date: 06/26/2020 CLINICAL DATA:  Fall EXAM: CT HEAD WITHOUT CONTRAST CT CERVICAL SPINE WITHOUT CONTRAST TECHNIQUE: Multidetector CT imaging of the head and cervical spine was performed following the standard protocol without intravenous contrast. Multiplanar CT image reconstructions of the cervical spine were also generated. COMPARISON:  CT brain and cervical spine 06/26/2020, 05/22/2020 FINDINGS: CT HEAD FINDINGS Brain: No acute territorial infarction, hemorrhage, or intracranial mass. Moderate atrophy. Moderate hypodensity in the white matter consistent with chronic small vessel ischemic change. Stable ventricle size. Vascular: No hyperdense vessels.  Carotid vascular  calcification Skull: Normal. Negative for fracture or focal lesion. Sinuses/Orbits: Mild frothy debris in the right ethmoid sinus. Other: None CT CERVICAL SPINE FINDINGS Alignment: No subluxation.  Facet alignment within normal limits. Skull base and vertebrae: No acute fracture. No primary bone lesion or focal pathologic process. Soft tissues and spinal canal: No prevertebral fluid or swelling. No visible canal hematoma. Disc levels: Mild multiple level degenerative change. Mild facet degenerative changes at multiple levels. Upper chest: Negative. Other: Partially visualized left subclavian stent. IMPRESSION: 1. No CT evidence for acute intracranial abnormality. Atrophy and chronic small vessel ischemic change of the white matter. 2. Degenerative changes of the cervical spine. No acute osseous abnormality. Electronically Signed   By: Donavan Foil M.D.   On: 06/26/2020 17:32    Scheduled Meds: . atorvastatin  10 mg Oral QHS  . calcium acetate  1,334 mg Oral TID WC  . heparin  5,000 Units Subcutaneous Q8H  . hydrALAZINE  25 mg Oral BID  . levothyroxine  175 mcg Oral QAC breakfast  . lisinopril  20 mg Oral Daily  . multivitamin  1 tablet Oral Daily  . omega-3 acid ethyl esters  2,000 mg Oral BID  . QUEtiapine  50 mg Oral QHS   Continuous Infusions: . sodium chloride    . ceFEPime (MAXIPIME) IV Stopped (06/28/20 0101)     LOS: 0 days   Time spent: 30 minutes.  Lorella Nimrod, MD [facility]  If 7PM-7AM, please contact night-coverage [email or web address]  06/28/2020, 4:19 PM   This record has been created using Systems analyst. Errors have been sought and corrected,but may not always be located. Such creation errors do not reflect on the standard of care.

## 2020-06-28 NOTE — Plan of Care (Signed)

## 2020-06-28 NOTE — Progress Notes (Signed)
PT Cancellation Note  Patient Details Name: Alejandro Armstrong MRN: [id number] DOB: Jun 18, 1929   Cancelled Treatment:    Reason Eval/Treat Not Completed: Other (comment) Pt sleeping upon PT entrance, to re-attempt as able.   Lieutenant Diego PT, DPT 10:49 AM,06/28/20

## 2020-06-28 NOTE — Progress Notes (Signed)
Physical Therapy Treatment Patient Details Name: Alejandro Armstrong MRN: [id number] DOB: 18-Dec-1929 Today's Date: 06/28/2020    History of Present Illness Patient is an 87 year old male who presents to the ED for weakness and reapeated falls at home. PMH includes ESRD -HD (MWF), HLD, HTN, DM non insulin dependent, hypothyroidism, ITP, MRSA (2017) with PICC line in R arm, clot formation in R arm, COVID w pneumonia 04/07/20, hypothyroidism, arthritis, kidney stones, renal agenesis, CKD stage IV.    PT Comments    Pt in bed, resting, in no acute distress. Ultimately pt seemed more confusion than previous session, often repeated the same question, impulsive with mobility attempts. Supine to sit with minA for trunk control and to initiate movement. Able to sit with bilateral UE support and PT assist to maintain balance due to posterior lean. Sit <> stand attempts several times; pt impulsive and attempting to perform with and without RW or PT assist. minAx2 with RW to stand (2 person assist for safety). Buckling and posterior lean noted once in standing, pt unable to step forward at this time. Returned to supine and repositioned with all needs in reach. Due to pt decline in functional mobility, current level of assistance needed, and decreased caregiver support, recommendation updated to SNF to maximize pt safety, mobility, and independence.     Follow Up Recommendations  SNF;Supervision/Assistance - 24 hour     Equipment Recommendations  Other (comment) (TBD)    Recommendations for Other Services       Precautions / Restrictions Precautions Precautions: Fall Restrictions Weight Bearing Restrictions: No    Mobility  Bed Mobility Overal bed mobility: Needs Assistance Bed Mobility: Supine to Sit;Sit to Supine     Supine to sit: Min assist;HOB elevated Sit to supine: Min guard;HOB elevated   General bed mobility comments: Pt needed assistance to initiate transfer as well as to address  postural sway when attempting to sit EOB  Transfers Overall transfer level: Needs assistance Equipment used: Rolling walker (2 wheeled) Transfers: Sit to/from Stand Sit to Stand: Min assist;+2 safety/equipment         General transfer comment: Pt impulsive with sit <> stand attempts, minA for physical assist, second assist to maintain safety. Pt with multiple attempts to impulsively stand with and without RW or PT assist  Ambulation/Gait             General Gait Details: unable due to safety concerns at this time and pt buckling/posterior lean   Stairs             Wheelchair Mobility    Modified Rankin (Stroke Patients Only)       Balance Overall balance assessment: Needs assistance Sitting-balance support: Feet supported Sitting balance-Leahy Scale: Poor Sitting balance - Comments: reliant on UE propping or PT assist to maintain balance     Standing balance-Leahy Scale: Poor Standing balance comment: reliant on RW support or PT support to maintain balance                            Cognition Arousal/Alertness: Awake/alert Behavior During Therapy: Restless;Impulsive Overall Cognitive Status: Difficult to assess                                 General Comments: pt with much more confusion this session, tangential speech, difficulty noted with situational awareness      Exercises  General Comments        Pertinent Vitals/Pain Pain Assessment: Faces Faces Pain Scale: Hurts a little bit Pain Location: with sit <> stand attempts, pt unable to describe location of pain Pain Descriptors / Indicators: Grimacing;Moaning Pain Intervention(s): Limited activity within patient's tolerance;Repositioned;Monitored during session    Home Living                      Prior Function            PT Goals (current goals can now be found in the care plan section) Progress towards PT goals: Progressing toward goals (slowly;  limited by pt fatige/confusion this session)    Frequency    Min 2X/week      PT Plan Discharge plan needs to be updated    Co-evaluation              AM-PAC PT "6 Clicks" Mobility   Outcome Measure  Help needed turning from your back to your side while in a flat bed without using bedrails?: A Little Help needed moving from lying on your back to sitting on the side of a flat bed without using bedrails?: A Little Help needed moving to and from a bed to a chair (including a wheelchair)?: A Lot Help needed standing up from a chair using your arms (e.g., wheelchair or bedside chair)?: A Little Help needed to walk in hospital room?: A Lot Help needed climbing 3-5 steps with a railing? : A Lot 6 Click Score: 15    End of Session Equipment Utilized During Treatment: Gait belt Activity Tolerance: Other (comment) (limited by pt impulsivity, unsteadiness and confusion) Patient left: in bed;with call bell/phone within reach;with bed alarm set Nurse Communication: Mobility status PT Visit Diagnosis: Other abnormalities of gait and mobility (R26.89);Muscle weakness (generalized) (M62.81);Difficulty in walking, not elsewhere classified (R26.2);History of falling (Z91.81)     Time: 3419-3790 PT Time Calculation (min) (ACUTE ONLY): 10 min  Charges:  $Therapeutic Activity: 8-22 mins                     Lieutenant Diego PT, DPT 3:02 PM,06/28/20

## 2020-06-28 NOTE — Progress Notes (Signed)
The patient has been restless, loud, and obnoxious entire shift. He just reently laid down to rest, but only wile RN stayed in room. Have requested a 1:1 sitter for him for day shift. I will continue to monitor him for safety.

## 2020-06-28 NOTE — Plan of Care (Signed)
Activity tolerance increased this shift. Patient able to get up with 2 person assist and walk to bathroom. Was not able to stand alone and maintain steadiness. Patient was able to remain on room air throughout shift. See flowsheets for O2 levels. Patient was restless, beligerent at times, and constantly tried getting out of bed . He also pulled out his PIV. RN restarted. Redirecting/reorienting him was unsuccessful. !;! Sitter remained with him in room entire shift. Although he continued boisterous activity, and attempts at getting out of bed, he was redirected often and remained safe from injury.   Problem: Health Behavior/Discharge Planning: Goal: Ability to manage health-related needs will improve Outcome: Not Met (add Reason)   Problem: Clinical Measurements: Goal: Respiratory complications will improve Outcome: Progressing   Problem: Activity: Goal: Risk for activity intolerance will decrease Outcome: Progressing

## 2020-06-28 NOTE — TOC Progression Note (Signed)
Transition of Care [facility]) - Progression Note    Patient Details  Name: Alejandro Armstrong MRN: [id number] Date of Birth: Apr 02, 1930  [facility]) CM/SW Contact  Shelbie Ammons, RN Phone Number: 06/28/2020, 8:40 AM  Clinical Narrative:   RNCM communicated with [name] with [facility]. Family has been in communication with them regarding placement in the facility however at this time patient is requiring administration of IV Haldol and a sitter at bedside. Patient will need to be free of both of these things for 48 hours before they will consider placement.     Expected Discharge Plan: [place] Barriers to Discharge: No Barriers Identified  Expected Discharge Plan and Services Expected Discharge Plan: [place] Choice: [place] arrangements for the past 2 months: [place]                                       Social Determinants of Health (SDOH) Interventions    Readmission Risk Interventions Readmission Risk Prevention Plan 04/18/2020  Transportation Screening Complete  PCP or Specialist Appt within 3-5 Days Complete  HRI or [place] Complete  Social Work Consult for [place] Planning/Counseling Complete  Palliative Care Screening Not Applicable  Medication Review Press photographer) Complete  Some recent data might be hidden

## 2020-06-29 LAB — URINE CULTURE: Culture: >=100000 — AB

## 2020-06-29 NOTE — Plan of Care (Signed)

## 2020-06-29 NOTE — Progress Notes (Signed)
Pharmacy Antibiotic Note  Alejandro Armstrong is a 87 y.o. male admitted on 06/26/2020 with UTI. Ucx growing Pseudomonas aeruginosa.   Pharmacy has been consulted for Cefepime dosing.  Pt is on MWF dialysis.  Plan: Day 3 IV abx.  Continue  Cefepime 1 gm q24hr based on indication and HD status.   Temp (24hrs), Avg:98 F (36.7 C), Min:97.6 F (36.4 C), Max:98.8 F (37.1 C)  Recent Labs  Lab 06/26/20 1544 06/27/20 0330 06/28/20 0157  WBC 7.7 5.3  --   CREATININE 5.90* 6.57* 7.69*    Estimated Creatinine Clearance: 6.2 mL/min (A) (by C-G formula based on SCr of 7.69 mg/dL (H)).    No Known Allergies  Antimicrobials this admission: 01/11 Cefepime >>   Microbiology results: 01/11 UCx: >=100,000 COLONIES/mL PSEUDOMONAS AERUGINOSAAbnormal  Thank you for allowing pharmacy to be a part of this patient's care.  Pernell Dupre, PharmD, BCPS Clinical Pharmacist 06/29/2020 10:18 AM

## 2020-06-29 NOTE — TOC Progression Note (Signed)
Transition of Care [facility]) - Progression Note    Patient Details  Name: Alejandro Armstrong MRN: [id number] Date of Birth: 02/11/1930  [facility]) CM/SW Contact  Shelbie Ammons, RN Phone Number: 06/29/2020, 2:44 PM  Clinical Narrative:   RNCM reached out to [facility] with [facility] at request of MD to see if they may be able to take him today. [name] again reviewed clinical documentation and noted that they will not be able to accept patient due to administration of Haldol Wednesday evening/Thurs morning and he has to be without both the need for sitter and prn Haldol for 48 hours before they will consider taking patient. They are agreeable to re-look at him Monday morning.     Expected Discharge Plan: [place] Barriers to Discharge: No Barriers Identified  Expected Discharge Plan and Services Expected Discharge Plan: [place] Choice: [place] arrangements for the past 2 months: [place]                                       Social Determinants of Health (SDOH) Interventions    Readmission Risk Interventions Readmission Risk Prevention Plan 04/18/2020  Transportation Screening Complete  PCP or Specialist Appt within 3-5 Days Complete  HRI or [place] Complete  Social Work Consult for [place] Planning/Counseling Complete  Palliative Care Screening Not Applicable  Medication Review Press photographer) Complete  Some recent data might be hidden

## 2020-06-29 NOTE — Progress Notes (Signed)
[facility], [place] 06/29/20  Subjective:   LOS: 1 1/13- Patient known to our practice from previous admissions and from outpatient dialysis.  Currently dialyzes at [facility] dialysis center.  Last treatment on 25 June 2020.  This time presents from [facility] via EMS after multiple falls.  Seen in the room this morning.  Very agitated and trying to climb out of bed.  Admitted under observation. 1/14: Seen during dialysis.  Had to be sedated prior to dialysis because of agitation.  Mouth appears dry.  Given IV fluid bolus during dialysis because of hypotension and tachycardia     Objective:  Vital signs in last 24 hours:  Temp:  [97.6 F (36.4 C)-98.8 F (37.1 C)] 97.6 F (36.4 C) (01/14 0800) Pulse Rate:  [52-103] 52 (01/14 0800) Resp:  [16-20] 17 (01/14 0800) BP: (87-171)/(60-95) 128/83 (01/14 0800) SpO2:  [92 %-100 %] 100 % (01/14 0800)  Weight change:  Filed Weights   06/27/20 0329  Weight: 70.1 kg    Intake/Output:    Intake/Output Summary (Last 24 hours) at 06/29/2020 1021 Last data filed at 06/28/2020 1400 Gross per 24 hour  Intake 360 ml  Output --  Net 360 ml    Physical Exam: General:  Frail, elderly, agitated  HEENT  anicteric, dry oral mucous membrane  Pulm/lungs  normal breathing effort, lungs are clear to auscultation  CVS/Heart  irregular rhythm, tachycardic  Abdomen:   Soft, nontender  Extremities:  No peripheral edema  Neurologic:  Agitated, confused  Skin:  No acute rashes, decreased turgor  Right IJ PermCath  Basic Metabolic Panel:  Recent Labs  Lab 06/26/20 1544 06/27/20 0330 06/28/20 0157  NA 134* 139 138  K 3.9 4.1 4.1  CL 95* 101 102  CO2 25 26 22   GLUCOSE 89 76 91  BUN 23 27* 36*  CREATININE 5.90* 6.57* 7.69*  CALCIUM 9.6 9.3 10.1     CBC: Recent Labs  Lab 06/26/20 1544 06/27/20 0330  WBC 7.7 5.3  NEUTROABS 5.3  --   HGB 10.8* 10.4*  HCT 32.5* 32.0*  MCV 99.7 100.0  PLT 190 158       Lab Results  Component Value Date   HEPBSAG NON REACTIVE 04/13/2020   HEPBIGM NON REACTIVE 04/13/2020      Microbiology:  Recent Results (from the past 240 hour(s))  SARS CORONAVIRUS 2 (TAT 6-24 HRS) Nasopharyngeal Nasopharyngeal Swab     Status: None   Collection Time: 06/26/20  4:53 PM   Specimen: Nasopharyngeal Swab  Result Value Ref Range Status   SARS Coronavirus 2 NEGATIVE NEGATIVE Final    Comment: (NOTE) SARS-CoV-2 target nucleic acids are NOT DETECTED.  The SARS-CoV-2 RNA is generally detectable in upper and lower respiratory specimens during the acute phase of infection. Negative results do not preclude SARS-CoV-2 infection, do not rule out co-infections with other pathogens, and should not be used as the sole basis for treatment or other patient management decisions. Negative results must be combined with clinical observations, patient history, and epidemiological information. The expected result is Negative.  Fact Sheet for Patients: SugarRoll.be  Fact Sheet for Healthcare Providers: https://www.woods-mathews.com/  This test is not yet approved or cleared by the [place] FDA and  has been authorized for detection and/or diagnosis of SARS-CoV-2 by FDA under an Emergency Use Authorization (EUA). This EUA will remain  in effect (meaning this test can be used) for the duration of the COVID-19 declaration under Se ction 564(b)(1) of  the Act, 21 U.S.C. section 360bbb-3(b)(1), unless the authorization is terminated or revoked sooner.  Performed at [facility], [place] [address], [place], [place] [address]   Urine culture     Status: Abnormal   Collection Time: 06/26/20  4:53 PM   Specimen: Urine, Random  Result Value Ref Range Status   Specimen Description   Final    URINE, RANDOM Performed at [facility], [address], [place], [place] [address]    Special Requests   Final    NONE Performed at  [facility], [address] [place], [place] [address]    Culture >=100,000 COLONIES/mL PSEUDOMONAS AERUGINOSA (A)  Final   Report Status 06/29/2020 FINAL  Final   Organism ID, Bacteria PSEUDOMONAS AERUGINOSA (A)  Final      Susceptibility   Pseudomonas aeruginosa - MIC*    CEFTAZIDIME 4 SENSITIVE Sensitive     CIPROFLOXACIN >=4 RESISTANT Resistant     GENTAMICIN >=16 RESISTANT Resistant     IMIPENEM 1 SENSITIVE Sensitive     PIP/TAZO 16 SENSITIVE Sensitive     * >=100,000 COLONIES/mL PSEUDOMONAS AERUGINOSA    Coagulation Studies: No results for input(s): LABPROT, INR in the last 72 hours.  Urinalysis: Recent Labs    06/26/20 1653  COLORURINE YELLOW*  LABSPEC 1.009  PHURINE 8.0  GLUCOSEU NEGATIVE  HGBUR SMALL*  BILIRUBINUR NEGATIVE  KETONESUR 5*  PROTEINUR 100*  NITRITE POSITIVE*  LEUKOCYTESUR LARGE*      Imaging: No results found.   Medications:   . sodium chloride    . ceFEPime (MAXIPIME) IV 1 g (06/29/20 0035)   . atorvastatin  10 mg Oral QHS  . calcium acetate  1,334 mg Oral TID WC  . Chlorhexidine Gluconate Cloth  6 each Topical Q0600  . heparin  5,000 Units Subcutaneous Q8H  . hydrALAZINE  25 mg Oral BID  . levothyroxine  175 mcg Oral QAC breakfast  . lisinopril  20 mg Oral Daily  . metoprolol tartrate  25 mg Oral BID  . multivitamin  1 tablet Oral Daily  . omega-3 acid ethyl esters  2,000 mg Oral BID  . QUEtiapine  50 mg Oral QHS   sodium chloride, acetaminophen **OR** acetaminophen, albuterol, hydrALAZINE, lidocaine-prilocaine, LORazepam, ondansetron **OR** ondansetron (ZOFRAN) IV, polyvinyl alcohol  Assessment/ Plan:  87 y.o. male with medical history of ESRD on HD MWF, hyperlipidemia, hypertension, diabetes mellitus type 2, hypothyroidism, ITP, history of MRSA bacteremia, anemia of chronic kidney disease, secondary hyperparathyroidism, generalized debility, history of Covid pneumonia  was admitted on 06/26/2020 for  Principal  Problem:   Multiple falls Active Problems:   ESRD (end stage renal disease) on dialysis [facility])   Essential hypertension   Hypothyroidism   Recurrent UTI   Anemia in ESRD (end-stage renal disease) ([place])  Lower urinary tract infectious disease [N39.0] Multiple falls [R29.6] Fall, initial encounter [W19.[facility] dialysis/MWF/right IJ PermCath/[facility]  #.  Altered mental status, status post fall # ESRD Altered mental status is unlikely due to uremia Last outpatient hemodialysis treatment was on January 10.   Seen during dialysis today   HEMODIALYSIS FLOWSHEET:  Blood Flow Rate (mL/min): 300 mL/min Arterial Pressure (mmHg): -200 mmHg Venous Pressure (mmHg): 90 mmHg Transmembrane Pressure (mmHg): 50 mmHg Ultrafiltration Rate (mL/min): 170 mL/min Dialysate Flow Rate (mL/min): 600 ml/min Conductivity: Machine : 14.1 Conductivity: Machine : 14.1 Dialysis Fluid Bolus: Normal Saline Bolus Amount (mL): 250 mL    #. Anemia of CKD  Lab Results  Component  Value Date   HGB 10.4 (L) 06/27/2020   Low dose EPO with HD for hemoglobin less than nine  #. Secondary hyperparathyroidism of renal origin N 25.81      Component Value Date/Time   PTH 132 (H) 04/12/2020 1740   Lab Results  Component Value Date   PHOS 2.3 (L) 05/24/2020   Monitor calcium and phos level during this admission   #. Diabetes type 2 with CKD Hgb A1c MFr Bld (%)  Date Value  04/14/2020 6.9 (H)   #Status post fall Physical therapy evaluation January 13 suggests SNF placement  #Confusion, agitation Likely secondary to UTI (pseudomonas) Currently being treated with IV cefepime  #Severe hypothyroidism TSH markedly elevated greater than 150 Treatment as per hospitalist team  #Tachycardia, atrial fibrillation Currently being treated with metoprolol 25 mg twice a day Metoprolol is dialyzed out Consider changing to carvedilol    LOS: [place] 1/14/202210:21 [place], Ronald

## 2020-06-29 NOTE — Progress Notes (Signed)
Hemodialysis patient known at [facility] MWF 11:45, patient is normally transported by sister. Please contact me with any dialysis placement concerns.  Elvera Bicker Dialysis Coordinator [phone]

## 2020-06-29 NOTE — Progress Notes (Signed)
PROGRESS NOTE    Alejandro Armstrong  OVF:[id number] DOB: 03/14/1930 DOA: 06/26/2020 PCP: Casilda Carls, MD   Brief Narrative: Taken from H&P Alejandro Armstrong is a 87 y.o. male with medical history significant for dementia, ESRD on hemodialysis, hypertension, and hypothyroidism, presenting to the emergency department for evaluation of generalized weakness and recurrent falls.  History is primarily obtained from the patient's wife who reports that the patient has been increasingly weak in general, fell twice today which is unusual for him, and she is unable to care for him at their retirement community.  Patient had similar symptoms last month for which she was admitted to the hospital, diagnosed with UTI, discharged to SNF, and had improved.  He has now developed recurrent general weakness, fell this morning, was evaluated in the emergency department and not found to have any significant injury or obvious indication for hospital admission at that time, but went to see an outpatient physician where he fell again.   Imaging without any significant abnormality, labs with markedly elevated TSH and UA  was positive for pyuria and nitrites, urine cultures pending.  Subjective: Patient was getting dialysis when seen today.  No new complaint.  Assessment & Plan:   Principal Problem:   Multiple falls Active Problems:   ESRD (end stage renal disease) on dialysis [facility])   Essential hypertension   Hypothyroidism   Recurrent UTI   Anemia in ESRD (end-stage renal disease) ([facility])  Generalized weakness/frequent falls.  UTI can be contributory.  Patient was recently sent home from rehab.  Family wants to put him back to rehab and willing to pay privately as unable to take care of him at home. Talked with caregiver and wife and according to them they cannot take care of him at home as patient is refusing to go to dialysis and medications at home. -PT recommended 24/7 care with home health/SNF. -Family is  trying to put him to [facility] with private pay, apparently [facility] saw and nursing note stating that he is needing one-to-one sitter which was wrong but they refused to take him until 48 hours without any intervention.  Patient has advanced dementia and sundowning is very common in this population group which can cause some agitation and pulling out lines requiring little bit more attention. -Continue Seroquel for sundowning at night. -Now [name] will take him after the weekend.  UTI.  UA with persistent pyuria and nitrites.  Recent urine culture with Pseudomonas so he was started on cefepime.  Remained afebrile and no leukocytosis.  Could not explain any urinary symptoms. - urine cultures with Pseudomonas aeruginosa-resistant to Cipro. -Continue with cefepime for total of 5 days.  Hypothyroidism.  TSH came back markedly elevated at 150, repeat 154.  It was within normal limit 2 months ago.  Talked with wife and caregiver and apparently he was not taking his thyroid medications properly.  T3 and T4 for levels including total and free all are decreased. -Increase the dose of Synthroid to 175 MCG. -He will need a repeat TSH in 4 weeks.  ESRD.  Family is struggling with him to go for dialysis regularly, last dialysis was on Monday.  No acute indication for urgent dialysis.  Patient normally gets dialysis Monday, Wednesday and Friday. -Nephrology was consulted.  Hypertension.  Blood pressure mildly elevated. -Continue with lisinopril, Lopressor and hydralazine.  Bradycardia.  Resolved rather heart rate in low 100s now after holding Lopressor. -Restarted Lopressor and monitor  Dementia. -Continue with supportive care and  delirium precautions.  Anemia of chronic disease.  Hemoglobin stable. -Continue to monitor.  Objective: Vitals:   06/29/20 1315 06/29/20 1330 06/29/20 1345 06/29/20 1400  BP: 132/86 (!) 139/94 105/82 (!) 144/73  Pulse: 68 69 77 66  Resp: (!) 21 16 16 19    Temp:      TempSrc:      SpO2:      Weight:      Height:        Intake/Output Summary (Last 24 hours) at 06/29/2020 1506 Last data filed at 06/29/2020 1400 Gross per 24 hour  Intake --  Output -1498 ml  Net 1498 ml   Filed Weights   06/27/20 0329  Weight: 70.1 kg    Examination:  General.  Elderly gentleman, in no acute distress. Pulmonary.  Lungs clear bilaterally, normal respiratory effort. CV.  Regular rate and rhythm, no JVD, rub or murmur. Abdomen.  Soft, nontender, nondistended, BS positive. CNS.  Alert and oriented x3.  No focal neurologic deficit. Extremities.  No edema, no cyanosis, pulses intact and symmetrical. Psychiatry.  Judgment and insight appears impaired.  DVT prophylaxis: Heparin Code Status: DNR Family Communication: Talked with wife and sister on phone Disposition Plan:  Status is: Observation  Dispo: The patient is from: Home              Anticipated d/c is to: SNF              Anticipated d/c date is: 2-3 days              Patient currently is medically stable for discharge.  Unsafe discharge planning as SNF refused to take him today stating that he is requiring a sitter which was not right.  Patient was having some sundowning effect which is common this population of patients.  He can go to SNF once they agreed to take him.  Now they will take him next week as no admissions over the weekend.  Consultants:   Nephrology  Procedures:  Antimicrobials:  Cefepime  Data Reviewed: I have personally reviewed following labs and imaging studies  CBC: Recent Labs  Lab 06/26/20 1544 06/27/20 0330  WBC 7.7 5.3  NEUTROABS 5.3  --   HGB 10.8* 10.4*  HCT 32.5* 32.0*  MCV 99.7 100.0  PLT 190 329   Basic Metabolic Panel: Recent Labs  Lab 06/26/20 1544 06/27/20 0330 06/28/20 0157  NA 134* 139 138  K 3.9 4.1 4.1  CL 95* 101 102  CO2 25 26 22   GLUCOSE 89 76 91  BUN 23 27* 36*  CREATININE 5.90* 6.57* 7.69*  CALCIUM 9.6 9.3 10.1    GFR: Estimated Creatinine Clearance: 6.2 mL/min (A) (by C-G formula based on SCr of 7.69 mg/dL (H)). Liver Function Tests: No results for input(s): AST, ALT, ALKPHOS, BILITOT, PROT, ALBUMIN in the last 168 hours. No results for input(s): LIPASE, AMYLASE in the last 168 hours. No results for input(s): AMMONIA in the last 168 hours. Coagulation Profile: No results for input(s): INR, PROTIME in the last 168 hours. Cardiac Enzymes: Recent Labs  Lab 06/27/20 0330  CKTOTAL 132   BNP (last 3 results) No results for input(s): PROBNP in the last 8760 hours. HbA1C: No results for input(s): HGBA1C in the last 72 hours. CBG: No results for input(s): GLUCAP in the last 168 hours. Lipid Profile: No results for input(s): CHOL, HDL, LDLCALC, TRIG, CHOLHDL, LDLDIRECT in the last 72 hours. Thyroid Function Tests: Recent Labs    06/27/20 0330  TSH 154.551*  150.000*  T4TOTAL 1.7*  FREET4 0.30*  T3FREE 0.8*   Anemia Panel: No results for input(s): VITAMINB12, FOLATE, FERRITIN, TIBC, IRON, RETICCTPCT in the last 72 hours. Sepsis Labs: No results for input(s): PROCALCITON, LATICACIDVEN in the last 168 hours.  Recent Results (from the past 240 hour(s))  SARS CORONAVIRUS 2 (TAT 6-24 HRS) Nasopharyngeal Nasopharyngeal Swab     Status: None   Collection Time: 06/26/20  4:53 PM   Specimen: Nasopharyngeal Swab  Result Value Ref Range Status   SARS Coronavirus 2 NEGATIVE NEGATIVE Final    Comment: (NOTE) SARS-CoV-2 target nucleic acids are NOT DETECTED.  The SARS-CoV-2 RNA is generally detectable in upper and lower respiratory specimens during the acute phase of infection. Negative results do not preclude SARS-CoV-2 infection, do not rule out co-infections with other pathogens, and should not be used as the sole basis for treatment or other patient management decisions. Negative results must be combined with clinical observations, patient history, and epidemiological information. The  expected result is Negative.  Fact Sheet for Patients: SugarRoll.be  Fact Sheet for Healthcare Providers: https://www.woods-mathews.com/  This test is not yet approved or cleared by the [place] FDA and  has been authorized for detection and/or diagnosis of SARS-CoV-2 by FDA under an Emergency Use Authorization (EUA). This EUA will remain  in effect (meaning this test can be used) for the duration of the COVID-19 declaration under Se ction 564(b)(1) of the Act, 21 U.S.C. section 360bbb-3(b)(1), unless the authorization is terminated or revoked sooner.  Performed at [facility], [place] [address] [place], [place] [address]   Urine culture     Status: Abnormal   Collection Time: 06/26/20  4:53 PM   Specimen: Urine, Random  Result Value Ref Range Status   Specimen Description   Final    URINE, RANDOM Performed at [facility], [place] [place], [place] [address]    Special Requests   Final    NONE Performed at [facility], [address] [place], [place] [address]    Culture >=100,000 COLONIES/mL PSEUDOMONAS AERUGINOSA (A)  Final   Report Status 06/29/2020 FINAL  Final   Organism ID, Bacteria PSEUDOMONAS AERUGINOSA (A)  Final      Susceptibility   Pseudomonas aeruginosa - MIC*    CEFTAZIDIME 4 SENSITIVE Sensitive     CIPROFLOXACIN >=4 RESISTANT Resistant     GENTAMICIN >=16 RESISTANT Resistant     IMIPENEM 1 SENSITIVE Sensitive     PIP/TAZO 16 SENSITIVE Sensitive     CEFEPIME 8 SENSITIVE Sensitive     * >=100,000 COLONIES/mL PSEUDOMONAS AERUGINOSA     Radiology Studies: No results found.  Scheduled Meds: . atorvastatin  10 mg Oral QHS  . calcium acetate  1,334 mg Oral TID WC  . Chlorhexidine Gluconate Cloth  6 each Topical Q0600  . heparin  5,000 Units Subcutaneous Q8H  . hydrALAZINE  25 mg Oral BID  . levothyroxine  175 mcg Oral QAC breakfast  . lisinopril  20 mg Oral Daily  .  metoprolol tartrate  25 mg Oral BID  . multivitamin  1 tablet Oral Daily  . omega-3 acid ethyl esters  2,000 mg Oral BID  . QUEtiapine  50 mg Oral QHS   Continuous Infusions: . sodium chloride    . ceFEPime (MAXIPIME) IV 1 g (06/29/20 0035)     LOS: 1 day   Time spent: 30 minutes.  Lorella Nimrod, MD [facility]  If 7PM-7AM, please  contact night-coverage [email or web address]  06/29/2020, 3:06 PM   This record has been created using Systems analyst. Errors have been sought and corrected,but may not always be located. Such creation errors do not reflect on the standard of care.

## 2020-06-29 NOTE — Progress Notes (Signed)
OT Cancellation Note  Patient Details Name: NAEL PETROSYAN MRN: [id number] DOB: 10-24-1929   Cancelled Treatment:    Reason Eval/Treat Not Completed: Patient at procedure or test/ unavailable. Pt out for dialysis. Will re-attempt at later date/time as available and medically appropriate.   Jeni Salles, MPH, MS, OTR/L ascom [phone] 06/29/20, 12:48 PM

## 2020-06-30 MED ORDER — TRAMADOL HCL 50 MG PO TABS: 50.0000 mg | ORAL_TABLET | Freq: Two times a day (BID) | ORAL | Status: DC | PRN

## 2020-06-30 MED ORDER — LACTATED RINGERS IV SOLN: INTRAVENOUS | Status: DC

## 2020-06-30 NOTE — Progress Notes (Signed)
[facility], [place] 06/30/20  Subjective:   LOS: 2 Patient lying in bed, arousable to gentle touch, stays confused. Nursing staff in to provide care and assist with feeding.  Received dialysis treatment yesterday, next dialysis on Monday.    Objective:  Vital signs in last 24 hours:  Temp:  [97.7 F (36.5 C)-98.7 F (37.1 C)] 97.7 F (36.5 C) (01/15 0953) Pulse Rate:  [54-77] 54 (01/15 0953) Resp:  [14-21] 14 (01/15 0953) BP: (105-197)/(73-95) 197/95 (01/15 0953) SpO2:  [97 %-100 %] 100 % (01/15 0953)  Weight change:  Filed Weights   06/27/20 0329  Weight: 70.1 kg    Intake/Output:    Intake/Output Summary (Last 24 hours) at 06/30/2020 1305 Last data filed at 06/30/2020 1040 Gross per 24 hour  Intake 240 ml  Output -1498 ml  Net 1738 ml    Physical Exam: General:  In no acute distress  HEENT  Normocephalic ,atraumatic,Oral mucous membrane dry  Pulm/lungs  Respirations symmetrical, unlabored, lungs clear  CVS/Heart  S1S2,NO rubs or gallops  Abdomen:   Soft, nontender,non distended  Extremities:  No peripheral edema  Neurologic:  Unable to answer questions appropriately, confused and lethargic  Skin:  Appears dry, decreased skin turgor  Right IJ PermCath  Basic Metabolic Panel:  Recent Labs  Lab 06/26/20 1544 06/27/20 0330 06/28/20 0157  NA 134* 139 138  K 3.9 4.1 4.1  CL 95* 101 102  CO2 25 26 22   GLUCOSE 89 76 91  BUN 23 27* 36*  CREATININE 5.90* 6.57* 7.69*  CALCIUM 9.6 9.3 10.1     CBC: Recent Labs  Lab 06/26/20 1544 06/27/20 0330  WBC 7.7 5.3  NEUTROABS 5.3  --   HGB 10.8* 10.4*  HCT 32.5* 32.0*  MCV 99.7 100.0  PLT 190 158      Lab Results  Component Value Date   HEPBSAG NON REACTIVE 04/13/2020   HEPBIGM NON REACTIVE 04/13/2020      Microbiology:  Recent Results (from the past 240 hour(s))  SARS CORONAVIRUS 2 (TAT 6-24 HRS) Nasopharyngeal Nasopharyngeal Swab     Status: None   Collection Time:  06/26/20  4:53 PM   Specimen: Nasopharyngeal Swab  Result Value Ref Range Status   SARS Coronavirus 2 NEGATIVE NEGATIVE Final    Comment: (NOTE) SARS-CoV-2 target nucleic acids are NOT DETECTED.  The SARS-CoV-2 RNA is generally detectable in upper and lower respiratory specimens during the acute phase of infection. Negative results do not preclude SARS-CoV-2 infection, do not rule out co-infections with other pathogens, and should not be used as the sole basis for treatment or other patient management decisions. Negative results must be combined with clinical observations, patient history, and epidemiological information. The expected result is Negative.  Fact Sheet for Patients: SugarRoll.be  Fact Sheet for Healthcare Providers: https://www.woods-mathews.com/  This test is not yet approved or cleared by the [place] FDA and  has been authorized for detection and/or diagnosis of SARS-CoV-2 by FDA under an Emergency Use Authorization (EUA). This EUA will remain  in effect (meaning this test can be used) for the duration of the COVID-19 declaration under Se ction 564(b)(1) of the Act, 21 U.S.C. section 360bbb-3(b)(1), unless the authorization is terminated or revoked sooner.  Performed at [facility], [place] [address], [place], [place] [address]   Urine culture     Status: Abnormal   Collection Time: 06/26/20  4:53 PM   Specimen: Urine, Random  Result Value Ref Range Status  Specimen Description   Final    URINE, RANDOM Performed at [facility], [address] [place], [place] [address]    Special Requests   Final    NONE Performed at [facility], [place], [place] [address]    Culture >=100,000 COLONIES/mL PSEUDOMONAS AERUGINOSA (A)  Final   Report Status 06/29/2020 FINAL  Final   Organism ID, Bacteria PSEUDOMONAS AERUGINOSA (A)  Final      Susceptibility   Pseudomonas aeruginosa -  MIC*    CEFTAZIDIME 4 SENSITIVE Sensitive     CIPROFLOXACIN >=4 RESISTANT Resistant     GENTAMICIN >=16 RESISTANT Resistant     IMIPENEM 1 SENSITIVE Sensitive     PIP/TAZO 16 SENSITIVE Sensitive     CEFEPIME 8 SENSITIVE Sensitive     * >=100,000 COLONIES/mL PSEUDOMONAS AERUGINOSA    Coagulation Studies: No results for input(s): LABPROT, INR in the last 72 hours.  Urinalysis: No results for input(s): COLORURINE, LABSPEC, PHURINE, GLUCOSEU, HGBUR, BILIRUBINUR, KETONESUR, PROTEINUR, UROBILINOGEN, NITRITE, LEUKOCYTESUR in the last 72 hours.  Invalid input(s): APPERANCEUR    Imaging: No results found.   Medications:   . sodium chloride    . ceFEPime (MAXIPIME) IV 1 g (06/29/20 2147)   . atorvastatin  10 mg Oral QHS  . calcium acetate  1,334 mg Oral TID WC  . Chlorhexidine Gluconate Cloth  6 each Topical Q0600  . heparin  5,000 Units Subcutaneous Q8H  . hydrALAZINE  25 mg Oral BID  . levothyroxine  175 mcg Oral QAC breakfast  . lisinopril  20 mg Oral Daily  . metoprolol tartrate  25 mg Oral BID  . multivitamin  1 tablet Oral Daily  . omega-3 acid ethyl esters  2,000 mg Oral BID  . QUEtiapine  50 mg Oral QHS   sodium chloride, acetaminophen **OR** acetaminophen, albuterol, hydrALAZINE, lidocaine-prilocaine, LORazepam, ondansetron **OR** ondansetron (ZOFRAN) IV, polyvinyl alcohol  Assessment/ Plan:  87 y.o. male with medical history of ESRD on HD MWF, hyperlipidemia, hypertension, diabetes mellitus type 2, hypothyroidism, ITP, history of MRSA bacteremia, anemia of chronic kidney disease, secondary hyperparathyroidism, generalized debility, history of Covid pneumonia  was admitted on 06/26/2020 for  Principal Problem:   Multiple falls Active Problems:   ESRD (end stage renal disease) on dialysis [facility])   Essential hypertension   Hypothyroidism   Recurrent UTI   Anemia in ESRD (end-stage renal disease) ([place])  Lower urinary tract infectious disease [N39.0] Multiple falls  [R29.6] Fall, initial encounter [W19.[facility] dialysis/MWF/right IJ PermCath/[facility]  #.  Altered mental status, status post fall # ESRD Received dialysis treatment yesterday Volume and electrolyte status acceptable No additional treatment required today Will continue MWF schedule   #. Anemia of CKD  Lab Results  Component Value Date   HGB 10.4 (L) 06/27/2020  Hgb level stays at goal  #. Secondary hyperparathyroidism of renal origin N 25.81      Component Value Date/Time   PTH 132 (H) 04/12/2020 1740   Lab Results  Component Value Date   PHOS 2.3 (L) 05/24/2020  Patient is on Phoslo Will continue monitoring bone mineral metabolism parameters   #. Diabetes type 2 with CKD Hgb A1c MFr Bld (%)  Date Value  04/14/2020 6.9 (H)   Blood sugar readings within low normal range  Encourage PO intake  #Status post fall Continues to be at risk for falls  Physical therapy evaluation January 13 suggests SNF placement  #Confusion, agitation Recent UTI with pseudomonas Getting treated  with IV cefepime  #Tachycardia, atrial fibrillation Heart rate stays within normal range Continues to be on Metoprolol    LOS: [place] 1/15/20221:05 PM  [place], [place]

## 2020-06-30 NOTE — Progress Notes (Signed)
PROGRESS NOTE    Alejandro Armstrong  JME:[id number] DOB: Apr 20, 1930 DOA: 06/26/2020 PCP: Casilda Carls, MD   Brief Narrative: Taken from H&P HEZIKIAH Armstrong is a 87 y.o. male with medical history significant for dementia, ESRD on hemodialysis, hypertension, and hypothyroidism, presenting to the emergency department for evaluation of generalized weakness and recurrent falls.  History is primarily obtained from the patient's wife who reports that the patient has been increasingly weak in general, fell twice today which is unusual for him, and she is unable to care for him at their retirement community.  Patient had similar symptoms last month for which she was admitted to the hospital, diagnosed with UTI, discharged to SNF, and had improved.  He has now developed recurrent general weakness, fell this morning, was evaluated in the emergency department and not found to have any significant injury or obvious indication for hospital admission at that time, but went to see an outpatient physician where he fell again.   Imaging without any significant abnormality, labs with markedly elevated TSH and UA  was positive for pyuria and nitrites, urine cultures pending.  Subjective: Patient was resting comfortably when seen today.  Easily arousable. Refused p.o. meds last night.  No other nursing concern.  Assessment & Plan:   Principal Problem:   Multiple falls Active Problems:   ESRD (end stage renal disease) on dialysis [facility])   Essential hypertension   Hypothyroidism   Recurrent UTI   Anemia in ESRD (end-stage renal disease) ([facility])  Generalized weakness/frequent falls.  UTI can be contributory.  Patient was recently sent home from rehab.  Family wants to put him back to rehab and willing to pay privately as unable to take care of him at home. Talked with caregiver and wife and according to them they cannot take care of him at home as patient is refusing to go to dialysis and medications at  home. -PT recommended 24/7 care with home health/SNF. -Family is trying to put him to [facility] with private pay, apparently [facility] saw and nursing note stating that he is needing one-to-one sitter which was wrong but they refused to take him until 48 hours without any intervention.  Patient has advanced dementia and sundowning is very common in this population group which can cause some agitation and pulling out lines requiring little bit more attention. -Continue Seroquel for sundowning at night. -Now [name] will take him after the weekend.  UTI.  UA with persistent pyuria and nitrites.  Recent urine culture with Pseudomonas so he was started on cefepime.  Remained afebrile and no leukocytosis.  Could not explain any urinary symptoms. - urine cultures with Pseudomonas aeruginosa-resistant to Cipro. -Continue with cefepime for total of 5 days.  Hypothyroidism.  TSH came back markedly elevated at 150, repeat 154.  It was within normal limit 2 months ago.  Talked with wife and caregiver and apparently he was not taking his thyroid medications properly.  T3 and T4 for levels including total and free all are decreased. -Increase the dose of Synthroid to 175 MCG. -He will need a repeat TSH in 4 weeks.  ESRD.  Family is struggling with him to go for dialysis regularly, last dialysis was on Monday.  No acute indication for urgent dialysis.  Patient normally gets dialysis Monday, Wednesday and Friday. -Nephrology was consulted.  Hypertension.  Blood pressure mildly elevated. -Continue with lisinopril, Lopressor and hydralazine.  Bradycardia.  Resolved rather heart rate in low 100s now after holding Lopressor. -Restarted Lopressor  and monitor  Dementia. -Continue with supportive care and delirium precautions.  Anemia of chronic disease.  Hemoglobin stable. -Continue to monitor.  Objective: Vitals:   06/29/20 2258 06/30/20 0550 06/30/20 0953 06/30/20 1313  BP: (!) 150/78 (!)  150/91 (!) 197/95 (!) 151/90  Pulse:  62 (!) 54 (!) 51  Resp:  17 14 17   Temp:  98.7 F (37.1 C) 97.7 F (36.5 C) 97.7 F (36.5 C)  TempSrc:  Axillary    SpO2:  97% 100% 100%  Weight:      Height:        Intake/Output Summary (Last 24 hours) at 06/30/2020 1615 Last data filed at 06/30/2020 1040 Gross per 24 hour  Intake 240 ml  Output --  Net 240 ml   Filed Weights   06/27/20 0329  Weight: 70.1 kg    Examination:  General.  Frail elderly man, in no acute distress. Pulmonary.  Lungs clear bilaterally, normal respiratory effort. CV.  Regular rate and rhythm, no JVD, rub or murmur. Abdomen.  Soft, nontender, nondistended, BS positive. CNS.  Alert and oriented x3.  No focal neurologic deficit. Extremities.  No edema, no cyanosis, pulses intact and symmetrical. Psychiatry.  Judgment and insight appears impaired.  DVT prophylaxis: Heparin Code Status: DNR Family Communication: Talked with wife  on phone Disposition Plan:  Status is: Observation  Dispo: The patient is from: Home              Anticipated d/c is to: SNF              Anticipated d/c date is: 2-3 days              Patient currently is medically stable for discharge.  Unsafe discharge planning as SNF refused to take him today stating that he is requiring a sitter which was not right.  Patient was having some sundowning effect which is common this population of patients.  He can go to SNF once they agreed to take him.  Now they will take him next week as no admissions over the weekend.  Consultants:   Nephrology  Procedures:  Antimicrobials:  Cefepime  Data Reviewed: I have personally reviewed following labs and imaging studies  CBC: Recent Labs  Lab 06/26/20 1544 06/27/20 0330  WBC 7.7 5.3  NEUTROABS 5.3  --   HGB 10.8* 10.4*  HCT 32.5* 32.0*  MCV 99.7 100.0  PLT 190 382   Basic Metabolic Panel: Recent Labs  Lab 06/26/20 1544 06/27/20 0330 06/28/20 0157  NA 134* 139 138  K 3.9 4.1 4.1  CL  95* 101 102  CO2 25 26 22   GLUCOSE 89 76 91  BUN 23 27* 36*  CREATININE 5.90* 6.57* 7.69*  CALCIUM 9.6 9.3 10.1   GFR: Estimated Creatinine Clearance: 6.2 mL/min (A) (by C-G formula based on SCr of 7.69 mg/dL (H)). Liver Function Tests: No results for input(s): AST, ALT, ALKPHOS, BILITOT, PROT, ALBUMIN in the last 168 hours. No results for input(s): LIPASE, AMYLASE in the last 168 hours. No results for input(s): AMMONIA in the last 168 hours. Coagulation Profile: No results for input(s): INR, PROTIME in the last 168 hours. Cardiac Enzymes: Recent Labs  Lab 06/27/20 0330  CKTOTAL 132   BNP (last 3 results) No results for input(s): PROBNP in the last 8760 hours. HbA1C: No results for input(s): HGBA1C in the last 72 hours. CBG: No results for input(s): GLUCAP in the last 168 hours. Lipid Profile: No results for input(s): CHOL,  HDL, LDLCALC, TRIG, CHOLHDL, LDLDIRECT in the last 72 hours. Thyroid Function Tests: No results for input(s): TSH, T4TOTAL, FREET4, T3FREE, THYROIDAB in the last 72 hours. Anemia Panel: No results for input(s): VITAMINB12, FOLATE, FERRITIN, TIBC, IRON, RETICCTPCT in the last 72 hours. Sepsis Labs: No results for input(s): PROCALCITON, LATICACIDVEN in the last 168 hours.  Recent Results (from the past 240 hour(s))  SARS CORONAVIRUS 2 (TAT 6-24 HRS) Nasopharyngeal Nasopharyngeal Swab     Status: None   Collection Time: 06/26/20  4:53 PM   Specimen: Nasopharyngeal Swab  Result Value Ref Range Status   SARS Coronavirus 2 NEGATIVE NEGATIVE Final    Comment: (NOTE) SARS-CoV-2 target nucleic acids are NOT DETECTED.  The SARS-CoV-2 RNA is generally detectable in upper and lower respiratory specimens during the acute phase of infection. Negative results do not preclude SARS-CoV-2 infection, do not rule out co-infections with other pathogens, and should not be used as the sole basis for treatment or other patient management decisions. Negative results must  be combined with clinical observations, patient history, and epidemiological information. The expected result is Negative.  Fact Sheet for Patients: SugarRoll.be  Fact Sheet for Healthcare Providers: https://www.woods-mathews.com/  This test is not yet approved or cleared by the [place] FDA and  has been authorized for detection and/or diagnosis of SARS-CoV-2 by FDA under an Emergency Use Authorization (EUA). This EUA will remain  in effect (meaning this test can be used) for the duration of the COVID-19 declaration under Se ction 564(b)(1) of the Act, 21 U.S.C. section 360bbb-3(b)(1), unless the authorization is terminated or revoked sooner.  Performed at [facility], [place] [address] [place], [place] [address]   Urine culture     Status: Abnormal   Collection Time: 06/26/20  4:53 PM   Specimen: Urine, Random  Result Value Ref Range Status   Specimen Description   Final    URINE, RANDOM Performed at [facility], [address] [place], [place] [address]    Special Requests   Final    NONE Performed at [facility], [address] [place], [place] [address]    Culture >=100,000 COLONIES/mL PSEUDOMONAS AERUGINOSA (A)  Final   Report Status 06/29/2020 FINAL  Final   Organism ID, Bacteria PSEUDOMONAS AERUGINOSA (A)  Final      Susceptibility   Pseudomonas aeruginosa - MIC*    CEFTAZIDIME 4 SENSITIVE Sensitive     CIPROFLOXACIN >=4 RESISTANT Resistant     GENTAMICIN >=16 RESISTANT Resistant     IMIPENEM 1 SENSITIVE Sensitive     PIP/TAZO 16 SENSITIVE Sensitive     CEFEPIME 8 SENSITIVE Sensitive     * >=100,000 COLONIES/mL PSEUDOMONAS AERUGINOSA     Radiology Studies: No results found.  Scheduled Meds: . atorvastatin  10 mg Oral QHS  . calcium acetate  1,334 mg Oral TID WC  . Chlorhexidine Gluconate Cloth  6 each Topical Q0600  . heparin  5,000 Units Subcutaneous Q8H  . hydrALAZINE  25 mg Oral  BID  . levothyroxine  175 mcg Oral QAC breakfast  . lisinopril  20 mg Oral Daily  . metoprolol tartrate  25 mg Oral BID  . multivitamin  1 tablet Oral Daily  . QUEtiapine  50 mg Oral QHS   Continuous Infusions: . sodium chloride    . ceFEPime (MAXIPIME) IV 1 g (06/29/20 2147)     LOS: 2 days   Time spent: 30 minutes.  Lorella Nimrod, MD [facility]  If 7PM-7AM,  please contact night-coverage [email or web address]  06/30/2020, 4:15 PM   This record has been created using Systems analyst. Errors have been sought and corrected,but may not always be located. Such creation errors do not reflect on the standard of care.

## 2020-06-30 NOTE — Progress Notes (Signed)
Pt refused all po medications. Attempted to give medications, but pt wouldn't open mouth. Education provided to pt. Will continue to monitor.

## 2020-06-30 NOTE — Progress Notes (Signed)
   06/30/20 1815  Assess: MEWS Score  Temp (!) 97.4 F (36.3 C)  BP (!) 201/97  Pulse Rate (!) 51  Resp 14  O2 Device Room Air  Assess: MEWS Score  MEWS Temp 0  MEWS Systolic 2  MEWS Pulse 0  MEWS RR 0  MEWS LOC 1  MEWS Score 3  MEWS Score Color Yellow  Assess: if the MEWS score is Yellow or Red  Were vital signs taken at a resting state? Yes  Focused Assessment No change from prior assessment  Early Detection of Sepsis Score *See Row Information* Low  MEWS guidelines implemented *See Row Information* Yes  Treat  Pain Scale Faces  Faces Pain Scale 4  Take Vital Signs  Increase Vital Sign Frequency  Yellow: Q 2hr X 2 then Q 4hr X 2, if remains yellow, continue Q 4hrs  Escalate  MEWS: Escalate Yellow: discuss with charge nurse/RN and consider discussing with provider and RRT  Notify: Charge Nurse/RN  Name of Charge Nurse/RN Notified Estill Bamberg  Date Charge Nurse/RN Notified 06/30/20  Time Charge Nurse/RN Notified 1800  Notify: Provider  Provider Name/Title Dr. Reesa Chew  Date Provider Notified 06/30/20  Time Provider Notified 1815  Notification Type Page  Response See new orders  Date of Provider Response 06/30/20  Time of Provider Response 1825

## 2020-06-30 NOTE — Progress Notes (Signed)
Physical Therapy Treatment Patient Details Name: Alejandro Armstrong MRN: [id number] DOB: Oct 25, 1929 Today's Date: 06/30/2020    History of Present Illness Patient is an 87 year old male who presents to the ED for weakness and reapeated falls at home. PMH includes ESRD -HD (MWF), HLD, HTN, DM non insulin dependent, hypothyroidism, ITP, MRSA (2017) with PICC line in R arm, clot formation in R arm, COVID w pneumonia 04/07/20, hypothyroidism, arthritis, kidney stones, renal agenesis, CKD stage IV.    PT Comments    Pt with eyes closed during session.  Will open briefly to look at me then close them again.  He does lift head when asked to fix pillow but closes eyes again.  Supine PROM x 10 for all ranges in attempts to awake and engage pt but not successful.  OOB deferred at this time due to lack of participation.   Follow Up Recommendations  SNF;Supervision/Assistance - 24 hour     Equipment Recommendations       Recommendations for Other Services       Precautions / Restrictions Precautions Precautions: Fall Restrictions Weight Bearing Restrictions: No    Mobility  Bed Mobility                  Transfers                    Ambulation/Gait                 Stairs             Wheelchair Mobility    Modified Rankin (Stroke Patients Only)       Balance                                            Cognition Arousal/Alertness: Lethargic Behavior During Therapy: Flat affect Overall Cognitive Status: No family/caregiver present to determine baseline cognitive functioning                                        Exercises      General Comments        Pertinent Vitals/Pain Pain Assessment: No/denies pain    Home Living                      Prior Function            PT Goals (current goals can now be found in the care plan section) Progress towards PT goals: Not progressing toward goals -  comment    Frequency    Min 2X/week      PT Plan Current plan remains appropriate    Co-evaluation              AM-PAC PT "6 Clicks" Mobility   Outcome Measure  Help needed turning from your back to your side while in a flat bed without using bedrails?: A Lot Help needed moving from lying on your back to sitting on the side of a flat bed without using bedrails?: A Lot Help needed moving to and from a bed to a chair (including a wheelchair)?: A Lot Help needed standing up from a chair using your arms (e.g., wheelchair or bedside chair)?: A Lot Help needed to walk in hospital room?: A Lot Help  needed climbing 3-5 steps with a railing? : A Lot 6 Click Score: 12    End of Session   Activity Tolerance: Patient limited by fatigue;Patient limited by lethargy Patient left: in bed;with call bell/phone within reach;with bed alarm set Nurse Communication: Mobility status       Time: 6553-7482 PT Time Calculation (min) (ACUTE ONLY): 8 min  Charges:  $Therapeutic Exercise: 8-22 mins                    Chesley Noon, PTA 06/30/20, 11:45 AM

## 2020-07-01 NOTE — Progress Notes (Signed)
A consult was placed to the IV Therapist for new iv access;  IV Nurse on night shift had restarted his line at shift change this am; line was pulled out by pt;  Attempted x 2, with ultrasound, and with assistance of staff RN holding the pt; unable to thread catheters due to scar tissue;  No further attempts made; pt has a hemodialysis catheter, but  is without peripheral iv access at this time; RN is sending secure chats to MDs.

## 2020-07-01 NOTE — Progress Notes (Signed)
[facility], [place] 07/01/20  Subjective:   LOS: 3 Patient lethargic but arousable. Due for dialysis again tomorrow.    Objective:  Vital signs in last 24 hours:  Temp:  [97.4 F (36.3 C)-97.8 F (36.6 C)] 97.8 F (36.6 C) (01/16 1000) Pulse Rate:  [51-85] 67 (01/16 1000) Resp:  [14-19] 16 (01/16 0347) BP: (121-201)/(80-98) 170/96 (01/16 1000) SpO2:  [94 %-100 %] 95 % (01/16 1000)  Weight change:  Filed Weights   06/27/20 0329  Weight: 70.1 kg    Intake/Output:    Intake/Output Summary (Last 24 hours) at 07/01/2020 1511 Last data filed at 07/01/2020 1411 Gross per 24 hour  Intake 60 ml  Output -  Net 60 ml    Physical Exam: General:  In no acute distress  HEENT  Normocephalic ,atraumatic,Oral mucous membrane dry  Pulm/lungs  Respirations symmetrical, unlabored, lungs clear  CVS/Heart  S1S2, No rubs or gallops  Abdomen:   Soft, nontender, non distended  Extremities:  Trace lower extremity edema  Neurologic:  Lethargic but arousable  Skin:  Appears dry, decreased skin turgor  Right IJ PermCath  Basic Metabolic Panel:  Recent Labs  Lab 06/26/20 1544 06/27/20 0330 06/28/20 0157  NA 134* 139 138  K 3.9 4.1 4.1  CL 95* 101 102  CO2 25 26 22   GLUCOSE 89 76 91  BUN 23 27* 36*  CREATININE 5.90* 6.57* 7.69*  CALCIUM 9.6 9.3 10.1     CBC: Recent Labs  Lab 06/26/20 1544 06/27/20 0330  WBC 7.7 5.3  NEUTROABS 5.3  --   HGB 10.8* 10.4*  HCT 32.5* 32.0*  MCV 99.7 100.0  PLT 190 158      Lab Results  Component Value Date   HEPBSAG NON REACTIVE 04/13/2020   HEPBIGM NON REACTIVE 04/13/2020      Microbiology:  Recent Results (from the past 240 hour(s))  SARS CORONAVIRUS 2 (TAT 6-24 HRS) Nasopharyngeal Nasopharyngeal Swab     Status: None   Collection Time: 06/26/20  4:53 PM   Specimen: Nasopharyngeal Swab  Result Value Ref Range Status   SARS Coronavirus 2 NEGATIVE NEGATIVE Final    Comment: (NOTE) SARS-CoV-2  target nucleic acids are NOT DETECTED.  The SARS-CoV-2 RNA is generally detectable in upper and lower respiratory specimens during the acute phase of infection. Negative results do not preclude SARS-CoV-2 infection, do not rule out co-infections with other pathogens, and should not be used as the sole basis for treatment or other patient management decisions. Negative results must be combined with clinical observations, patient history, and epidemiological information. The expected result is Negative.  Fact Sheet for Patients: SugarRoll.be  Fact Sheet for Healthcare Providers: https://www.woods-mathews.com/  This test is not yet approved or cleared by the [place] FDA and  has been authorized for detection and/or diagnosis of SARS-CoV-2 by FDA under an Emergency Use Authorization (EUA). This EUA will remain  in effect (meaning this test can be used) for the duration of the COVID-19 declaration under Se ction 564(b)(1) of the Act, 21 U.S.C. section 360bbb-3(b)(1), unless the authorization is terminated or revoked sooner.  Performed at [facility], [place] [address], [place], [place] [address]   Urine culture     Status: Abnormal   Collection Time: 06/26/20  4:53 PM   Specimen: Urine, Random  Result Value Ref Range Status   Specimen Description   Final    URINE, RANDOM Performed at [facility], [place], [place]  [address]    Special Requests   Final    NONE Performed at [facility], [place], [place] [address]    Culture >=100,000 COLONIES/mL PSEUDOMONAS AERUGINOSA (A)  Final   Report Status 06/29/2020 FINAL  Final   Organism ID, Bacteria PSEUDOMONAS AERUGINOSA (A)  Final      Susceptibility   Pseudomonas aeruginosa - MIC*    CEFTAZIDIME 4 SENSITIVE Sensitive     CIPROFLOXACIN >=4 RESISTANT Resistant     GENTAMICIN >=16 RESISTANT Resistant     IMIPENEM 1 SENSITIVE  Sensitive     PIP/TAZO 16 SENSITIVE Sensitive     CEFEPIME 8 SENSITIVE Sensitive     * >=100,000 COLONIES/mL PSEUDOMONAS AERUGINOSA    Coagulation Studies: No results for input(s): LABPROT, INR in the last 72 hours.  Urinalysis: No results for input(s): COLORURINE, LABSPEC, PHURINE, GLUCOSEU, HGBUR, BILIRUBINUR, KETONESUR, PROTEINUR, UROBILINOGEN, NITRITE, LEUKOCYTESUR in the last 72 hours.  Invalid input(s): APPERANCEUR    Imaging: No results found.   Medications:   . sodium chloride    . ceFEPime (MAXIPIME) IV 1 g (06/30/20 2237)  . lactated ringers 50 mL/hr at 06/30/20 2058   . atorvastatin  10 mg Oral QHS  . calcium acetate  1,334 mg Oral TID WC  . Chlorhexidine Gluconate Cloth  6 each Topical Q0600  . heparin  5,000 Units Subcutaneous Q8H  . hydrALAZINE  25 mg Oral BID  . levothyroxine  175 mcg Oral QAC breakfast  . lisinopril  20 mg Oral Daily  . metoprolol tartrate  25 mg Oral BID  . multivitamin  1 tablet Oral Daily  . QUEtiapine  50 mg Oral QHS   sodium chloride, acetaminophen **OR** acetaminophen, albuterol, hydrALAZINE, lidocaine-prilocaine, LORazepam, ondansetron **OR** ondansetron (ZOFRAN) IV, polyvinyl alcohol, traMADol  Assessment/ Plan:  87 y.o. male with medical history of ESRD on HD MWF, hyperlipidemia, hypertension, diabetes mellitus type 2, hypothyroidism, ITP, history of MRSA bacteremia, anemia of chronic kidney disease, secondary hyperparathyroidism, generalized debility, history of Covid pneumonia  was admitted on 06/26/2020 for  Principal Problem:   Multiple falls Active Problems:   ESRD (end stage renal disease) on dialysis [facility])   Essential hypertension   Hypothyroidism   Recurrent UTI   Anemia in ESRD (end-stage renal disease) ([place])  Lower urinary tract infectious disease [N39.0] Multiple falls [R29.6] Fall, initial encounter [W19.[facility] dialysis/MWF/right IJ PermCath/[facility]  #.  Altered mental status, status post fall #  ESRD We will plan for hemodialysis treatment again tomorrow.  Orders to be prepared.   #. Anemia of CKD  Lab Results  Component Value Date   HGB 10.4 (L) 06/27/2020  Hemoglobin currently at target.  Continue periodically monitor CBC.  Patient not currently on Epogen.  #. Secondary hyperparathyroidism of renal origin N 25.81      Component Value Date/Time   PTH 132 (H) 04/12/2020 1740   Lab Results  Component Value Date   PHOS 2.3 (L) 05/24/2020  Recheck serum phosphorus tomorrow.   #. Diabetes type 2 with CKD Hgb A1c MFr Bld (%)  Date Value  04/14/2020 6.9 (H)   Blood sugar readings within low normal range  Encourage PO intake  #Status post fall Continues to be at risk for falls  Physical therapy evaluation January 13 suggests SNF placement  #Confusion, agitation Recent UTI with pseudomonas Getting treated with IV cefepime  #Tachycardia, atrial fibrillation Continue metoprolol for rate control.  Currently    LOS: 3 Alejandro Armstrong 1/16/20223:11 PM  [address] [place], [place]

## 2020-07-01 NOTE — Progress Notes (Signed)
PROGRESS NOTE    Alejandro Armstrong  MWN:[id number] DOB: [birth year] DOA: 06/26/2020 PCP: Casilda Carls, MD   Brief Narrative: Taken from H&P Alejandro Armstrong is a 87 y.o. male with medical history significant for dementia, ESRD on hemodialysis, hypertension, and hypothyroidism, presenting to the emergency department for evaluation of generalized weakness and recurrent falls.  History is primarily obtained from the patient's wife who reports that the patient has been increasingly weak in general, fell twice today which is unusual for him, and she is unable to care for him at their retirement community.  Patient had similar symptoms last month for which she was admitted to the hospital, diagnosed with UTI, discharged to SNF, and had improved.  He has now developed recurrent general weakness, fell this morning, was evaluated in the emergency department and not found to have any significant injury or obvious indication for hospital admission at that time, but went to see an outpatient physician where he fell again.   Imaging without any significant abnormality, labs with markedly elevated TSH and UA  was positive for pyuria and nitrites, urine cultures pending.  Subjective: Patient appears very lethargic and lying in bed.  Not following any commands. Refusing all p.o. intake. Discussed with wife on phone, if he continue like this he will be a hospice candidate, she and his sister both seems interested in hospice.  Assessment & Plan:   Principal Problem:   Multiple falls Active Problems:   ESRD (end stage renal disease) on dialysis [facility])   Essential hypertension   Hypothyroidism   Recurrent UTI   Anemia in ESRD (end-stage renal disease) (HCC)  Generalized weakness/frequent falls.  UTI can be contributory.  Patient was recently sent home from rehab.  Family wants to put him back to rehab and willing to pay privately as unable to take care of him at home. Talked with caregiver and wife and  according to them they cannot take care of him at home as patient is refusing to go to dialysis and medications at home. -PT recommended 24/7 care with home health/SNF. -Family is trying to put him to [facility] with private pay, apparently [facility] saw and nursing note stating that he is needing one-to-one sitter which was wrong but they refused to take him until 48 hours without any intervention.  Patient has advanced dementia and sundowning is very common in this population group which can cause some agitation and pulling out lines requiring little bit more attention. -Continue Seroquel for sundowning at night. -Now [name] will take him after the weekend. -If patient continued to refuse p.o. intake he will be hospice appropriate. -Palliative care consult  UTI.  UA with persistent pyuria and nitrites.  Recent urine culture with Pseudomonas so he was started on cefepime.  Remained afebrile and no leukocytosis.  Could not explain any urinary symptoms. - urine cultures with Pseudomonas aeruginosa-resistant to Cipro. -Continue with cefepime for total of 5 days.  Hypothyroidism.  TSH came back markedly elevated at 150, repeat 154.  It was within normal limit 2 months ago.  Talked with wife and caregiver and apparently he was not taking his thyroid medications properly.  T3 and T4 for levels including total and free all are decreased. -Increase the dose of Synthroid to 175 MCG. -He will need a repeat TSH in 4 weeks.  ESRD.  Family is struggling with him to go for dialysis regularly, last dialysis was on Monday.  No acute indication for urgent dialysis.  Patient normally gets  dialysis Monday, Wednesday and Friday. -Nephrology was consulted.  Hypertension.  Blood pressure mildly elevated. -Continue with lisinopril, Lopressor and hydralazine.  Bradycardia.  Resolved rather heart rate in low 100s now after holding Lopressor. -Restarted Lopressor and monitor  Dementia. -Continue with  supportive care and delirium precautions.  Anemia of chronic disease.  Hemoglobin stable. -Continue to monitor.  Objective: Vitals:   06/30/20 2255 07/01/20 0027 07/01/20 0347 07/01/20 1000  BP: 127/80 (!) 127/98 121/83 (!) 170/96  Pulse: 84 85 61 67  Resp: 18 19 16    Temp: 97.8 F (36.6 C) 97.6 F (36.4 C) 97.6 F (36.4 C) 97.8 F (36.6 C)  TempSrc:    Axillary  SpO2: 98% 94% 100% 95%  Weight:      Height:        Intake/Output Summary (Last 24 hours) at 07/01/2020 1522 Last data filed at 07/01/2020 1411 Gross per 24 hour  Intake 60 ml  Output --  Net 60 ml   Filed Weights   06/27/20 0329  Weight: 70.1 kg    Examination:  General.  Lethargic and frail elderly man, easily arousable but not following any commands, in no acute distress. Pulmonary.  Lungs clear bilaterally, normal respiratory effort. CV.  Regular rate and rhythm, no JVD, rub or murmur. Abdomen.  Soft, nontender, nondistended, BS positive. CNS.  Lethargic appearing and not following any commands. Extremities.  No edema, no cyanosis, pulses intact and symmetrical. Psychiatry.  Judgment and insight appears impaired.  DVT prophylaxis: Heparin Code Status: DNR Family Communication: Talked with wife  on phone Disposition Plan:  Status is: Observation  Dispo: The patient is from: Home              Anticipated d/c is to: SNF              Anticipated d/c date is: 2-3 days              Patient currently is medically stable for discharge.  Unsafe discharge planning as SNF refused to take him today stating that he is requiring a sitter which was not right.  Patient was having some sundowning effect which is common this population of patients.  He can go to SNF once they agreed to take him.  Now they will take him next week as no admissions over the weekend.  Patient is refusing all p.o. intake for the past 2 days.  Family seems interested in hospice and comfort care only.  Palliative care consult was  placed.  Consultants:   Nephrology  Procedures:  Antimicrobials:  Cefepime  Data Reviewed: I have personally reviewed following labs and imaging studies  CBC: Recent Labs  Lab 06/26/20 1544 06/27/20 0330  WBC 7.7 5.3  NEUTROABS 5.3  --   HGB 10.8* 10.4*  HCT 32.5* 32.0*  MCV 99.7 100.0  PLT 190 536   Basic Metabolic Panel: Recent Labs  Lab 06/26/20 1544 06/27/20 0330 06/28/20 0157  NA 134* 139 138  K 3.9 4.1 4.1  CL 95* 101 102  CO2 25 26 22   GLUCOSE 89 76 91  BUN 23 27* 36*  CREATININE 5.90* 6.57* 7.69*  CALCIUM 9.6 9.3 10.1   GFR: Estimated Creatinine Clearance: 6.2 mL/min (A) (by C-G formula based on SCr of 7.69 mg/dL (H)). Liver Function Tests: No results for input(s): AST, ALT, ALKPHOS, BILITOT, PROT, ALBUMIN in the last 168 hours. No results for input(s): LIPASE, AMYLASE in the last 168 hours. No results for input(s): AMMONIA in the last  168 hours. Coagulation Profile: No results for input(s): INR, PROTIME in the last 168 hours. Cardiac Enzymes: Recent Labs  Lab 06/27/20 0330  CKTOTAL 132   BNP (last 3 results) No results for input(s): PROBNP in the last 8760 hours. HbA1C: No results for input(s): HGBA1C in the last 72 hours. CBG: No results for input(s): GLUCAP in the last 168 hours. Lipid Profile: No results for input(s): CHOL, HDL, LDLCALC, TRIG, CHOLHDL, LDLDIRECT in the last 72 hours. Thyroid Function Tests: No results for input(s): TSH, T4TOTAL, FREET4, T3FREE, THYROIDAB in the last 72 hours. Anemia Panel: No results for input(s): VITAMINB12, FOLATE, FERRITIN, TIBC, IRON, RETICCTPCT in the last 72 hours. Sepsis Labs: No results for input(s): PROCALCITON, LATICACIDVEN in the last 168 hours.  Recent Results (from the past 240 hour(s))  SARS CORONAVIRUS 2 (TAT 6-24 HRS) Nasopharyngeal Nasopharyngeal Swab     Status: None   Collection Time: 06/26/20  4:53 PM   Specimen: Nasopharyngeal Swab  Result Value Ref Range Status   SARS  Coronavirus 2 NEGATIVE NEGATIVE Final    Comment: (NOTE) SARS-CoV-2 target nucleic acids are NOT DETECTED.  The SARS-CoV-2 RNA is generally detectable in upper and lower respiratory specimens during the acute phase of infection. Negative results do not preclude SARS-CoV-2 infection, do not rule out co-infections with other pathogens, and should not be used as the sole basis for treatment or other patient management decisions. Negative results must be combined with clinical observations, patient history, and epidemiological information. The expected result is Negative.  Fact Sheet for Patients: SugarRoll.be  Fact Sheet for Healthcare Providers: https://www.woods-mathews.com/  This test is not yet approved or cleared by the [place] FDA and  has been authorized for detection and/or diagnosis of SARS-CoV-2 by FDA under an Emergency Use Authorization (EUA). This EUA will remain  in effect (meaning this test can be used) for the duration of the COVID-19 declaration under Se ction 564(b)(1) of the Act, 21 U.S.C. section 360bbb-3(b)(1), unless the authorization is terminated or revoked sooner.  Performed at [facility], [place] [address] [place], [place] [address]   Urine culture     Status: Abnormal   Collection Time: 06/26/20  4:53 PM   Specimen: Urine, Random  Result Value Ref Range Status   Specimen Description   Final    URINE, RANDOM Performed at [facility], [address] [place], [place] [address]    Special Requests   Final    NONE Performed at [facility], [address] [place], [place] [address]    Culture >=100,000 COLONIES/mL PSEUDOMONAS AERUGINOSA (A)  Final   Report Status 06/29/2020 FINAL  Final   Organism ID, Bacteria PSEUDOMONAS AERUGINOSA (A)  Final      Susceptibility   Pseudomonas aeruginosa - MIC*    CEFTAZIDIME 4 SENSITIVE Sensitive     CIPROFLOXACIN >=4 RESISTANT Resistant      GENTAMICIN >=16 RESISTANT Resistant     IMIPENEM 1 SENSITIVE Sensitive     PIP/TAZO 16 SENSITIVE Sensitive     CEFEPIME 8 SENSITIVE Sensitive     * >=100,000 COLONIES/mL PSEUDOMONAS AERUGINOSA     Radiology Studies: No results found.  Scheduled Meds: . atorvastatin  10 mg Oral QHS  . calcium acetate  1,334 mg Oral TID WC  . Chlorhexidine Gluconate Cloth  6 each Topical Q0600  . heparin  5,000 Units Subcutaneous Q8H  . hydrALAZINE  25 mg Oral BID  . levothyroxine  175 mcg Oral QAC breakfast  .  lisinopril  20 mg Oral Daily  . metoprolol tartrate  25 mg Oral BID  . multivitamin  1 tablet Oral Daily  . QUEtiapine  50 mg Oral QHS   Continuous Infusions: . sodium chloride    . ceFEPime (MAXIPIME) IV 1 g (06/30/20 2237)  . lactated ringers 50 mL/hr at 06/30/20 2058     LOS: 3 days   Time spent: 30 minutes.  Lorella Nimrod, MD [facility]  If 7PM-7AM, please contact night-coverage [email or web address]  07/01/2020, 3:22 PM   This record has been created using Systems analyst. Errors have been sought and corrected,but may not always be located. Such creation errors do not reflect on the standard of care.

## 2020-07-02 DIAGNOSIS — Z7189 Other specified counseling: Secondary | ICD-10-CM

## 2020-07-02 MED ORDER — HALOPERIDOL LACTATE 2 MG/ML PO CONC
0.5000 mg | ORAL | Status: DC | PRN
  Filled 2020-07-02: qty 0.3

## 2020-07-02 MED ORDER — ONDANSETRON 4 MG PO TBDP
4.0000 mg | ORAL_TABLET | Freq: Four times a day (QID) | ORAL | Status: DC | PRN
  Filled 2020-07-02: qty 1

## 2020-07-02 MED ORDER — OXYCODONE HCL 20 MG/ML PO CONC
5.0000 mg | ORAL | Status: DC | PRN
  Administered 2020-07-02: 5 mg via ORAL
  Filled 2020-07-02: qty 1

## 2020-07-02 MED ORDER — SODIUM CHLORIDE 0.9% FLUSH: 3.0000 mL | Freq: Two times a day (BID) | INTRAVENOUS | Status: DC

## 2020-07-02 MED ORDER — SODIUM CHLORIDE 0.9 % IV SOLN: 250.0000 mL | INTRAVENOUS | Status: DC | PRN

## 2020-07-02 MED ORDER — LORAZEPAM 1 MG PO TABS: 1.0000 mg | ORAL_TABLET | ORAL | Status: DC | PRN

## 2020-07-02 MED ORDER — GLYCOPYRROLATE 0.2 MG/ML IJ SOLN
0.2000 mg | INTRAMUSCULAR | Status: DC | PRN
  Filled 2020-07-02: qty 1

## 2020-07-02 MED ORDER — HALOPERIDOL 0.5 MG PO TABS
0.5000 mg | ORAL_TABLET | ORAL | Status: DC | PRN
  Filled 2020-07-02: qty 1

## 2020-07-02 MED ORDER — BIOTENE DRY MOUTH MT LIQD
15.0000 mL | OROMUCOSAL | Status: DC | PRN
Start: 2020-07-02 — End: 2020-07-04

## 2020-07-02 MED ORDER — SODIUM CHLORIDE 0.9% FLUSH: 3.0000 mL | INTRAVENOUS | Status: DC | PRN

## 2020-07-02 MED ORDER — HALOPERIDOL LACTATE 5 MG/ML IJ SOLN: 0.5000 mg | INTRAMUSCULAR | Status: DC | PRN

## 2020-07-02 MED ORDER — POLYVINYL ALCOHOL 1.4 % OP SOLN
1.0000 [drp] | Freq: Four times a day (QID) | OPHTHALMIC | Status: DC | PRN
Start: 2020-07-02 — End: 2020-07-04
  Filled 2020-07-02 (×2): qty 15

## 2020-07-02 MED ORDER — MORPHINE SULFATE (CONCENTRATE) 10 MG/0.5ML PO SOLN: 5.0000 mg | ORAL | Status: DC | PRN

## 2020-07-02 MED ORDER — MORPHINE SULFATE (PF) 2 MG/ML IV SOLN
1.0000 mg | INTRAVENOUS | Status: DC | PRN
Start: 2020-07-02 — End: 2020-07-02

## 2020-07-02 MED ORDER — ONDANSETRON HCL 4 MG/2ML IJ SOLN: 4.0000 mg | Freq: Four times a day (QID) | INTRAMUSCULAR | Status: DC | PRN

## 2020-07-02 MED ORDER — LORAZEPAM 2 MG/ML PO CONC
1.0000 mg | ORAL | Status: DC | PRN
  Administered 2020-07-02: 1 mg via SUBLINGUAL
  Filled 2020-07-02: qty 0.5

## 2020-07-02 MED ORDER — GLYCOPYRROLATE 1 MG PO TABS
1.0000 mg | ORAL_TABLET | ORAL | Status: DC | PRN
  Filled 2020-07-02: qty 1

## 2020-07-02 MED ORDER — LORAZEPAM 2 MG/ML IJ SOLN: 1.0000 mg | INTRAMUSCULAR | Status: DC | PRN

## 2020-07-02 NOTE — Progress Notes (Signed)
Patient refused PM medications. Patient does not have IV access MD is aware. Patient is very sleepy but arousable. Will continue to monitor.

## 2020-07-02 NOTE — Care Management Important Message (Signed)
Important Message  Patient Details  Name: Alejandro Armstrong MRN: [id number] Date of Birth: 09-15-1929   Medicare Important Message Given:  Other (see comment)  Patient is on comfort care and awaiting bed at the [place]. Out of respect for the patient and family no Important Message from [facility] given.   [name] 07/02/2020, 2:36 PM

## 2020-07-02 NOTE — Progress Notes (Addendum)
Called by Palliative care  Asked to rinse him back as his wife just called and is arranging PAllitive care. She does not want him to continue on Treatment. Dr,. Lateef was notified by this nurse

## 2020-07-02 NOTE — TOC Progression Note (Signed)
Transition of Care [facility]) - Progression Note    Patient Details  Name: Alejandro Armstrong MRN: [id number] Date of Birth: 1930/01/17  Transition of Care [facility]) CM/SW Contact  Shelbie Ammons, RN Phone Number: 07/02/2020, 12:28 PM  Clinical Narrative:   RNCM received communication from [place] care NP that after talking with patient's spouse she has decided for comfort care and would like patient to be transitioned to the [facility]. RNCM reached out to patient's wife Alejandro Armstrong to confirm her wishes and she confirms she wants patient to be made comfort care and wishes for him to be able to go to the [facility] in [place].  RNCM reached out to [facility] with [facility] and provided referral.     Expected Discharge Plan: [place] Barriers to Discharge: No Barriers Identified  Expected Discharge Plan and Services Expected Discharge Plan: [place] Choice: [place] arrangements for the past 2 months: [place]                                       Social Determinants of Health (SDOH) Interventions    Readmission Risk Interventions Readmission Risk Prevention Plan 04/18/2020  Transportation Screening Complete  PCP or Specialist Appt within 3-5 Days Complete  HRI or [place] Complete  Social Work Consult for [place] Planning/Counseling Complete  Palliative Care Screening Not Applicable  Medication Review [employer]) Complete  Some recent data might be hidden

## 2020-07-02 NOTE — Progress Notes (Signed)
Physical Therapy Treatment Patient Details Name: Alejandro Armstrong MRN: [id number] DOB: 10-30-1929 Today's Date: 07/02/2020    History of Present Illness Patient is an 87 year old male who presents to the ED for weakness and reapeated falls at home. PMH includes ESRD -HD (MWF), HLD, HTN, DM non insulin dependent, hypothyroidism, ITP, MRSA (2017) with PICC line in R arm, clot formation in R arm, COVID w pneumonia 04/07/20, hypothyroidism, arthritis, kidney stones, renal agenesis, CKD stage IV.    PT Comments    Patient mostly non verbal during this session. He does open eyes with verbal and tactile stimuli and is moving spontaneously in bed. Assisted patient to sitting position in attempts to increase alertness and participation with mobility efforts. Eyes opened intermittently in sitting, however patient with minimal overall participation. Assisted patient back to bed and repositioned for comfort. Patient is not making progress towards meeting goals at this time with decreased level of alertness and participation with PT. SNF recommended at this time.    Follow Up Recommendations  SNF     Equipment Recommendations   (to be determined at next level of care)    Recommendations for Other Services       Precautions / Restrictions Precautions Precautions: Fall Restrictions Weight Bearing Restrictions: No    Mobility  Bed Mobility Overal bed mobility: Needs Assistance Bed Mobility: Sit to Supine;Supine to Sit     Supine to sit: Total assist Sit to supine: Max assist   General bed mobility comments: bed mobility performed in attempts to increase alertness for participation with therapy. limited to no active assistance provided by patient with bed mobility efforts  Transfers                 General transfer comment: unable to attempt due to poor participation  Ambulation/Gait                 Stairs             Wheelchair Mobility    Modified Rankin  (Stroke Patients Only)       Balance Overall balance assessment: Needs assistance Sitting-balance support: Feet supported Sitting balance-Leahy Scale: Poor Sitting balance - Comments: Mod A- Max A to maintain sitting balance with mainly loss of balance to right. total sitting time less than 20 seconds                                    Cognition Arousal/Alertness: Lethargic   Overall Cognitive Status: No family/caregiver present to determine baseline cognitive functioning                                 General Comments: patient has difficulty following commands this session      Exercises      General Comments        Pertinent Vitals/Pain Pain Assessment:  (unable to state, no signs noted with mobility)    Home Living                      Prior Function            PT Goals (current goals can now be found in the care plan section) Acute Rehab PT Goals Patient Stated Goal: pt unable to state PT Goal Formulation: Patient unable to participate in goal setting Time For Goal Achievement:  07/11/20 Potential to Achieve Goals: Good Progress towards PT goals: Not progressing toward goals - comment    Frequency    Min 2X/week      PT Plan Current plan remains appropriate    Co-evaluation              AM-PAC PT "6 Clicks" Mobility   Outcome Measure  Help needed turning from your back to your side while in a flat bed without using bedrails?: A Lot Help needed moving from lying on your back to sitting on the side of a flat bed without using bedrails?: Total Help needed moving to and from a bed to a chair (including a wheelchair)?: Total Help needed standing up from a chair using your arms (e.g., wheelchair or bedside chair)?: Total Help needed to walk in hospital room?: Total Help needed climbing 3-5 steps with a railing? : Total 6 Click Score: 7    End of Session   Activity Tolerance: Patient limited by  lethargy Patient left: in bed;with call bell/phone within reach;with bed alarm set Nurse Communication: Mobility status PT Visit Diagnosis: Other abnormalities of gait and mobility (R26.89);Muscle weakness (generalized) (M62.81);Difficulty in walking, not elsewhere classified (R26.2);History of falling (Z91.81)     Time: 6168-3729 PT Time Calculation (min) (ACUTE ONLY): 10 min  Charges:  $Therapeutic Activity: 8-22 mins                     Minna Merritts, PT, MPT  Percell Locus 07/02/2020, 1:04 PM

## 2020-07-02 NOTE — Progress Notes (Signed)
OT Cancellation Note  Patient Details Name: Alejandro Armstrong MRN: [id number] DOB: Aug 31, 1929   Cancelled Treatment:    Reason Eval/Treat Not Completed: Fatigue/lethargy limiting ability to participate. Attempted to see [name] twice this AM. Both attempts, he was sleeping soundly and could not awakened, despite verbal and tactile efforts to rouse him. Will try again at a later time/date, as patient is available.  Josiah Lobo, PhD, [place], OTR/L ascom [phone] 07/02/20, 11:40 AM

## 2020-07-02 NOTE — Progress Notes (Signed)
PROGRESS NOTE    Alejandro Armstrong  QQI:[id number] DOB: 24-Aug-1929 DOA: 06/26/2020 PCP: Casilda Carls, MD   Brief Narrative: Taken from H&P Alejandro Armstrong is a 87 y.o. male with medical history significant for dementia, ESRD on hemodialysis, hypertension, and hypothyroidism, presenting to the emergency department for evaluation of generalized weakness and recurrent falls.  History is primarily obtained from the patient's wife who reports that the patient has been increasingly weak in general, fell twice today which is unusual for him, and she is unable to care for him at their retirement community.  Patient had similar symptoms last month for which she was admitted to the hospital, diagnosed with UTI, discharged to SNF, and had improved.  He has now developed recurrent general weakness, fell this morning, was evaluated in the emergency department and not found to have any significant injury or obvious indication for hospital admission at that time, but went to see an outpatient physician where he fell again.   Imaging without any significant abnormality, labs with markedly elevated TSH and UA  was positive for pyuria and nitrites, urine cultures pending.  Palliative care met with family and decided to make him comfort care only, will be discharging to hospice home if the bed is available.  Subjective: Patient remained very lethargic and withdrawn.  No p.o. intake for the past couple of days.  Lost IV line, very hard stick.  Patient was lying with eyes closed, spontaneously moving but not responding to any commands.  Assessment & Plan:   Principal Problem:   Multiple falls Active Problems:   ESRD (end stage renal disease) on dialysis [facility])   Essential hypertension   Hypothyroidism   Recurrent UTI   Anemia in ESRD (end-stage renal disease) (HCC)  Generalized weakness/frequent falls.  UTI can be contributory.  Patient was recently sent home from rehab.  Family wants to put him back  to rehab and willing to pay privately as unable to take care of him at home. Talked with caregiver and wife and according to them they cannot take care of him at home as patient is refusing to go to dialysis and medications at home. -PT recommended 24/7 care with home health/SNF. -Family is trying to put him to [facility] with private pay, apparently [facility] saw and nursing note stating that he is needing one-to-one sitter which was wrong but they refused to take him until 48 hours without any intervention.  Patient has advanced dementia and sundowning is very common in this population group which can cause some agitation and pulling out lines requiring little bit more attention. -Continue Seroquel for sundowning at night. Palliative care meeting with family and they transitioned him to comfort measures only.  No more dialysis or labs. Consult was made for hospice home so he can be transferred once bed is available.  UTI.  UA with persistent pyuria and nitrites.  Recent urine culture with Pseudomonas so he was started on cefepime.  Remained afebrile and no leukocytosis.  Could not explain any urinary symptoms. - urine cultures with Pseudomonas aeruginosa-resistant to Cipro. Completed a 5-day course with cefepime.  Hypothyroidism.  TSH came back markedly elevated at 150, repeat 154.  It was within normal limit 2 months ago.  Talked with wife and caregiver and apparently he was not taking his thyroid medications properly.  T3 and T4 for levels including total and free all are decreased. -Increase the dose of Synthroid to 175 MCG. -He will need a repeat TSH in 4  weeks.  ESRD.  Family is struggling with him to go for dialysis regularly, last dialysis was on Monday.  No acute indication for urgent dialysis.  Patient normally gets dialysis Monday, Wednesday and Friday. -Nephrology was consulted. Stop dialysis as patient is now comfort care only.  Hypertension.  Blood pressure mildly  elevated. -Continue with lisinopril, Lopressor and hydralazine.  Bradycardia.  Resolved . Holding all medications except comfort measure.  Dementia. -Continue with supportive care and delirium precautions.  Anemia of chronic disease.  Hemoglobin stable. -Continue to monitor.  Objective: Vitals:   07/02/20 0808 07/02/20 1115 07/02/20 1145 07/02/20 1200  BP: (!) 196/98  (!) 141/92 (!) 124/104  Pulse: (!) 58  (!) 50 90  Resp: 17     Temp: 98 F (36.7 C)  98 F (36.7 C)   TempSrc: Oral  Oral   SpO2: 98% 99% 98%   Weight:      Height:        Intake/Output Summary (Last 24 hours) at 07/02/2020 1441 Last data filed at 07/02/2020 1415 Gross per 24 hour  Intake 0 ml  Output -335 ml  Net 335 ml   Filed Weights   06/27/20 0329  Weight: 70.1 kg    Examination:  General.  Very lethargic and frail elderly man, in no acute distress. Pulmonary.  Lungs clear bilaterally, normal respiratory effort. CV.  Regular rate and rhythm, no JVD, rub or murmur. Abdomen.  Soft, nontender, nondistended, BS positive. CNS.  Alert and oriented x3.  No focal neurologic deficit. Extremities.  No edema, no cyanosis, pulses intact and symmetrical. Psychiatry.  Judgment and insight appears impaired.  DVT prophylaxis: Heparin Code Status: DNR Family Communication: Talked with wife  on phone Disposition Plan:  Status is: Observation  Dispo: The patient is from: Home              Anticipated d/c is to: SNF              Anticipated d/c date is: 1 days               Patient is being transitioned to comfort care only.  Waiting for hospice home bed availability.  Consultants:   Nephrology  Procedures:  Antimicrobials:   Data Reviewed: I have personally reviewed following labs and imaging studies  CBC: Recent Labs  Lab 06/26/20 1544 06/27/20 0330  WBC 7.7 5.3  NEUTROABS 5.3  --   HGB 10.8* 10.4*  HCT 32.5* 32.0*  MCV 99.7 100.0  PLT 190 754   Basic Metabolic Panel: Recent Labs  Lab  06/26/20 1544 06/27/20 0330 06/28/20 0157  NA 134* 139 138  K 3.9 4.1 4.1  CL 95* 101 102  CO2 '25 26 22  ' GLUCOSE 89 76 91  BUN 23 27* 36*  CREATININE 5.90* 6.57* 7.69*  CALCIUM 9.6 9.3 10.1   GFR: Estimated Creatinine Clearance: 6.2 mL/min (A) (by C-G formula based on SCr of 7.69 mg/dL (H)). Liver Function Tests: No results for input(s): AST, ALT, ALKPHOS, BILITOT, PROT, ALBUMIN in the last 168 hours. No results for input(s): LIPASE, AMYLASE in the last 168 hours. No results for input(s): AMMONIA in the last 168 hours. Coagulation Profile: No results for input(s): INR, PROTIME in the last 168 hours. Cardiac Enzymes: Recent Labs  Lab 06/27/20 0330  CKTOTAL 132   BNP (last 3 results) No results for input(s): PROBNP in the last 8760 hours. HbA1C: No results for input(s): HGBA1C in the last 72 hours. CBG: No results  for input(s): GLUCAP in the last 168 hours. Lipid Profile: No results for input(s): CHOL, HDL, LDLCALC, TRIG, CHOLHDL, LDLDIRECT in the last 72 hours. Thyroid Function Tests: No results for input(s): TSH, T4TOTAL, FREET4, T3FREE, THYROIDAB in the last 72 hours. Anemia Panel: No results for input(s): VITAMINB12, FOLATE, FERRITIN, TIBC, IRON, RETICCTPCT in the last 72 hours. Sepsis Labs: No results for input(s): PROCALCITON, LATICACIDVEN in the last 168 hours.  Recent Results (from the past 240 hour(s))  SARS CORONAVIRUS 2 (TAT 6-24 HRS) Nasopharyngeal Nasopharyngeal Swab     Status: None   Collection Time: 06/26/20  4:53 PM   Specimen: Nasopharyngeal Swab  Result Value Ref Range Status   SARS Coronavirus 2 NEGATIVE NEGATIVE Final    Comment: (NOTE) SARS-CoV-2 target nucleic acids are NOT DETECTED.  The SARS-CoV-2 RNA is generally detectable in upper and lower respiratory specimens during the acute phase of infection. Negative results do not preclude SARS-CoV-2 infection, do not rule out co-infections with other pathogens, and should not be used as  the sole basis for treatment or other patient management decisions. Negative results must be combined with clinical observations, patient history, and epidemiological information. The expected result is Negative.  Fact Sheet for Patients: SugarRoll.be  Fact Sheet for Healthcare Providers: https://www.woods-mathews.com/  This test is not yet approved or cleared by the [place] FDA and  has been authorized for detection and/or diagnosis of SARS-CoV-2 by FDA under an Emergency Use Authorization (EUA). This EUA will remain  in effect (meaning this test can be used) for the duration of the COVID-19 declaration under Se ction 564(b)(1) of the Act, 21 U.S.C. section 360bbb-3(b)(1), unless the authorization is terminated or revoked sooner.  Performed at [facility], [place] [address] [place], [place] [address]   Urine culture     Status: Abnormal   Collection Time: 06/26/20  4:53 PM   Specimen: Urine, Random  Result Value Ref Range Status   Specimen Description   Final    URINE, RANDOM Performed at [facility], [address] [place], [place] [address]    Special Requests   Final    NONE Performed at [facility], [address] [place], [place] [address]    Culture >=100,000 COLONIES/mL PSEUDOMONAS AERUGINOSA (A)  Final   Report Status 06/29/2020 FINAL  Final   Organism ID, Bacteria PSEUDOMONAS AERUGINOSA (A)  Final      Susceptibility   Pseudomonas aeruginosa - MIC*    CEFTAZIDIME 4 SENSITIVE Sensitive     CIPROFLOXACIN >=4 RESISTANT Resistant     GENTAMICIN >=16 RESISTANT Resistant     IMIPENEM 1 SENSITIVE Sensitive     PIP/TAZO 16 SENSITIVE Sensitive     CEFEPIME 8 SENSITIVE Sensitive     * >=100,000 COLONIES/mL PSEUDOMONAS AERUGINOSA     Radiology Studies: No results found.  Scheduled Meds: . Chlorhexidine Gluconate Cloth  6 each Topical Q0600  . metoprolol tartrate  25 mg Oral BID  .  QUEtiapine  50 mg Oral QHS   Continuous Infusions:    LOS: 4 days   Time spent: 30 minutes.  Lorella Nimrod, MD [facility]  If 7PM-7AM, please contact night-coverage [email or web address]  07/02/2020, 2:41 PM   This record has been created using Systems analyst. Errors have been sought and corrected,but may not always be located. Such creation errors do not reflect on the standard of care.

## 2020-07-02 NOTE — Consult Note (Addendum)
Consultation Note Date: 07/02/2020   Patient Name: Alejandro Armstrong  DOB: August 26, 1929  MRN: [id number]  Age / Sex: 87 y.o., male  PCP: Casilda Carls, MD Referring Physician: Lorella Nimrod, MD  Reason for Consultation: Establishing goals of care  HPI/Patient Profile:  Alejandro Armstrong is a 87 y.o. male with medical history significant for dementia, ESRD on hemodialysis, hypertension, and hypothyroidism, presenting to the emergency department for evaluation of generalized weakness and recurrent falls.  History is primarily obtained from the patient's wife who reports that the patient has been increasingly weak in general, fell twice today which is unusual for him, and she is unable to care for him at their retirement community.   Clinical Assessment and Goals of Care: Patient is resting in bed with a even and unlabored respirations.  He does not awaken to touch or voice.  He appears comfortable.  Spoke with his wife on the phone.  Upon introducing myself and palliative medicine, wife states that a plan is already in place for the patient to go to the hospice facility.  She states that 6 weeks ago he was fully independent with ADLs and used a Rollator.  She states over the past 6 weeks he has declined and began refusing dialysis and his medications.  We discussed his diagnoses, prognosis, GOC, EOL wishes disposition and options.  Created space and opportunity for patient  to explore thoughts and feelings regarding current medical information.   A detailed discussion was had today regarding advanced directives.  The difference between an aggressive medical intervention path and a comfort care path was discussed.  We discussed stopping his dialysis and providing care, and medications for his comfort and dignity.  Values and goals of care important to patient and family were attempted to be elicited.  Discussed  limitations of medical interventions to prolong quality of life in some situations and discussed the concept of human mortality.  Wife states that she understands her husband's status.  She states that she wants him to go to the hospice facility and she wants to stop all life prolonging care at this time. She states that he does not have children.  They have been married for 8 years.     I completed a MOST form today through [employer]. Wife Alejandro Armstrong requested technological help from the Health and safety inspector of the facility. A photocopy was placed in the chart to be scanned into EMR. The patient outlined their wishes for the following treatment decisions:  Cardiopulmonary Resuscitation: Do Not Attempt Resuscitation (DNR/No CPR)  Medical Interventions: Comfort Measures: Keep clean, warm, and dry. Use medication by any route, positioning, wound care, and other measures to relieve pain and suffering. Use oxygen, suction and manual treatment of airway obstruction as needed for comfort. Do not transfer to the hospital unless comfort needs cannot be met in current location.  Antibiotics: No antibiotics (use other measures to relieve symptoms)  IV Fluids: No IV fluids (provide other measures to ensure comfort)  Feeding Tube: No feeding tube  SUMMARY OF RECOMMENDATIONS   Transition to comfort focused care and hospice facility placement.   Prognosis:   < 2 weeks       Primary Diagnoses: Present on Admission: . Hypothyroidism . Anemia in ESRD (end-stage renal disease) ([place]) . Essential hypertension . Recurrent UTI   I have reviewed the medical record, interviewed the patient and family, and examined the patient. The following aspects are pertinent.  Past Medical History:  Diagnosis Date  . Arthritis   . Dialysis patient [facility])    Mon, Wed Fri  . Hyperlipidemia   . Hypertension   . Hypothyroidism   . Idiopathic thrombocytopenia purpura ([place])   . Kidney stones   . Recurrent UTI   . Renal  agenesis    discovered at age 35  . Type 2 diabetes mellitus ([place])   . Ureteral stricture    Social History   Socioeconomic History  . Marital status: Married    Spouse name: Not on file  . Number of children: Not on file  . Years of education: Not on file  . Highest education level: Not on file  Occupational History  . Occupation: retired  Tobacco Use  . Smoking status: Never Smoker  . Smokeless tobacco: Never Used  Vaping Use  . Vaping Use: Never used  Substance and Sexual Activity  . Alcohol use: No    Alcohol/week: 0.0 standard drinks  . Drug use: No  . Sexual activity: Never  Other Topics Concern  . Not on file  Social History Narrative  . Not on file   Social Determinants of Health   Financial Resource Strain: Not on file  Food Insecurity: Not on file  Transportation Needs: Not on file  Physical Activity: Not on file  Stress: Not on file  Social Connections: Not on file   Family History  Problem Relation Age of Onset  . Cervical cancer Sister   . CAD Father    Scheduled Meds: . atorvastatin  10 mg Oral QHS  . calcium acetate  1,334 mg Oral TID WC  . Chlorhexidine Gluconate Cloth  6 each Topical Q0600  . heparin  5,000 Units Subcutaneous Q8H  . hydrALAZINE  25 mg Oral BID  . levothyroxine  175 mcg Oral QAC breakfast  . lisinopril  20 mg Oral Daily  . metoprolol tartrate  25 mg Oral BID  . multivitamin  1 tablet Oral Daily  . QUEtiapine  50 mg Oral QHS   Continuous Infusions: . sodium chloride    . lactated ringers 50 mL/hr at 06/30/20 2058   PRN Meds:.sodium chloride, acetaminophen **OR** acetaminophen, albuterol, hydrALAZINE, lidocaine-prilocaine, LORazepam, ondansetron **OR** ondansetron (ZOFRAN) IV, polyvinyl alcohol, traMADol Medications Prior to Admission:  Prior to Admission medications   Medication Sig Start Date End Date Taking? Authorizing Provider  acetaminophen (TYLENOL) 500 MG tablet Take 1,000 mg by mouth every 6 (six) hours as needed  for moderate pain or headache.    Yes [provider]  albuterol (VENTOLIN HFA) 108 (90 Base) MCG/ACT inhaler Inhale 2 puffs into the lungs every 6 (six) hours. 04/19/20  Yes Sharen Hones, MD  atorvastatin (LIPITOR) 10 MG tablet Take 10 mg by mouth at bedtime.   Yes [provider]  B Complex-C-Folic Acid (RENA-VITE RX) 1 MG TABS Take 1 tablet by mouth daily. 04/16/20  Yes [provider]  calcium acetate (PHOSLO) 667 MG capsule Take 2 capsules (1,334 mg total) by mouth 3 (three) times daily with meals. 04/19/20  Yes Sharen Hones,  MD  Carboxymethylcellulose Sodium 0.25 % SOLN Place 1-2 drops into both eyes 3 (three) times daily as needed (for dry eyes).   Yes [provider]  Cholecalciferol (VITAMIN D3) 1.25 MG (50000 UT) CAPS Take 50,000 Units by mouth every Wednesday.    Yes [provider]  hydrALAZINE (APRESOLINE) 25 MG tablet Take 25 mg by mouth 2 (two) times daily.   Yes [provider]  levothyroxine (SYNTHROID) 125 MCG tablet Take 125 mcg by mouth daily before breakfast.    Yes [provider]  lidocaine-prilocaine (EMLA) cream Apply 1 application topically daily as needed (port access).  10/03/16  Yes [provider]  lisinopril (PRINIVIL,ZESTRIL) 20 MG tablet Take 20 mg by mouth daily.  06/24/12  Yes [provider]  metoprolol tartrate (LOPRESSOR) 25 MG tablet Take 0.5 tablets (12.5 mg total) by mouth 2 (two) times daily. Patient taking differently: Take 25 mg by mouth 2 (two) times daily. 04/19/20  Yes Sharen Hones, MD  Omega-3 1000 MG CAPS Take 2,000 mg by mouth 2 (two) times daily.    Yes [provider]   No Known Allergies Review of Systems  Unable to perform ROS   Physical Exam Constitutional:      Comments: Eyes closed.   Pulmonary:     Effort: Pulmonary effort is normal.     Vital Signs: BP (!) 124/104   Pulse 90   Temp 98 F (36.7 C) (Oral)   Resp 17   Ht '5\' 7"'  (1.702 m)   Wt 70.1  kg   SpO2 98%   BMI 24.20 kg/m  Pain Scale: Faces   Pain Score: 0-No pain   SpO2: SpO2: 98 % O2 Device:SpO2: 98 % O2 Flow Rate: .   IO: Intake/output summary:   Intake/Output Summary (Last 24 hours) at 07/02/2020 1217 Last data filed at 07/02/2020 0930 Gross per 24 hour  Intake 0 ml  Output -  Net 0 ml    LBM: Last BM Date: 06/30/20 Baseline Weight: Weight: 70.1 kg Most recent weight: Weight: 70.1 kg       Time In: 11:50 Time Out: 12:20 Time Total: 30 min Greater than 50%  of this time was spent counseling and coordinating care related to the above assessment and plan.  Signed by: Asencion Gowda, NP   Please contact Palliative Medicine Team phone at [phone] for questions and concerns.  For individual provider: See Shea Evans

## 2020-07-02 NOTE — Progress Notes (Signed)
[facility] [place], [place] 07/02/20  Subjective:   LOS: 4 Patient seen and evaluated at bedside. Family has decided upon comfort and hospice care. This appears to be quite reasonable as the patient has not been responding very well the past several days.    Objective:  Vital signs in last 24 hours:  Temp:  [98 F (36.7 C)-98.3 F (36.8 C)] 98 F (36.7 C) (01/17 1145) Pulse Rate:  [50-90] 90 (01/17 1200) Resp:  [16-17] 17 (01/17 0808) BP: (124-196)/(92-104) 124/104 (01/17 1200) SpO2:  [93 %-99 %] 98 % (01/17 1145)  Weight change:  Filed Weights   06/27/20 0329  Weight: 70.1 kg    Intake/Output:    Intake/Output Summary (Last 24 hours) at 07/02/2020 1520 Last data filed at 07/02/2020 1415 Gross per 24 hour  Intake 0 ml  Output -335 ml  Net 335 ml    Physical Exam: General:  In no acute distress  HEENT  Normocephalic ,atraumatic, Oral mucous membrane dry  Pulm/lungs  clear bilateral  CVS/Heart  S1S2, No rubs or gallops  Abdomen:   Soft, nontender, non distended  Extremities:  Trace lower extremity edema  Neurologic:  Lethargic   Skin:  Appears dry, decreased skin turgor  Right IJ PermCath  Basic Metabolic Panel:  Recent Labs  Lab 06/26/20 1544 06/27/20 0330 06/28/20 0157  NA 134* 139 138  K 3.9 4.1 4.1  CL 95* 101 102  CO2 25 26 22   GLUCOSE 89 76 91  BUN 23 27* 36*  CREATININE 5.90* 6.57* 7.69*  CALCIUM 9.6 9.3 10.1     CBC: Recent Labs  Lab 06/26/20 1544 06/27/20 0330  WBC 7.7 5.3  NEUTROABS 5.3  --   HGB 10.8* 10.4*  HCT 32.5* 32.0*  MCV 99.7 100.0  PLT 190 158      Lab Results  Component Value Date   HEPBSAG NON REACTIVE 04/13/2020   HEPBIGM NON REACTIVE 04/13/2020      Microbiology:  Recent Results (from the past 240 hour(s))  SARS CORONAVIRUS 2 (TAT 6-24 HRS) Nasopharyngeal Nasopharyngeal Swab     Status: None   Collection Time: 06/26/20  4:53 PM   Specimen: Nasopharyngeal Swab  Result Value Ref  Range Status   SARS Coronavirus 2 NEGATIVE NEGATIVE Final    Comment: (NOTE) SARS-CoV-2 target nucleic acids are NOT DETECTED.  The SARS-CoV-2 RNA is generally detectable in upper and lower respiratory specimens during the acute phase of infection. Negative results do not preclude SARS-CoV-2 infection, do not rule out co-infections with other pathogens, and should not be used as the sole basis for treatment or other patient management decisions. Negative results must be combined with clinical observations, patient history, and epidemiological information. The expected result is Negative.  Fact Sheet for Patients: SugarRoll.be  Fact Sheet for Healthcare Providers: https://www.woods-mathews.com/  This test is not yet approved or cleared by the [place] FDA and  has been authorized for detection and/or diagnosis of SARS-CoV-2 by FDA under an Emergency Use Authorization (EUA). This EUA will remain  in effect (meaning this test can be used) for the duration of the COVID-19 declaration under Se ction 564(b)(1) of the Act, 21 U.S.C. section 360bbb-3(b)(1), unless the authorization is terminated or revoked sooner.  Performed at [facility], [place] [address] [place], [place] [address]   Urine culture     Status: Abnormal   Collection Time: 06/26/20  4:53 PM   Specimen: Urine, Random  Result Value Ref Range Status  Specimen Description   Final    URINE, RANDOM Performed at [facility], [address] [place], [place] [address]    Special Requests   Final    NONE Performed at [facility], [place], [place] [address]    Culture >=100,000 COLONIES/mL PSEUDOMONAS AERUGINOSA (A)  Final   Report Status 06/29/2020 FINAL  Final   Organism ID, Bacteria PSEUDOMONAS AERUGINOSA (A)  Final      Susceptibility   Pseudomonas aeruginosa - MIC*    CEFTAZIDIME 4 SENSITIVE Sensitive     CIPROFLOXACIN >=4  RESISTANT Resistant     GENTAMICIN >=16 RESISTANT Resistant     IMIPENEM 1 SENSITIVE Sensitive     PIP/TAZO 16 SENSITIVE Sensitive     CEFEPIME 8 SENSITIVE Sensitive     * >=100,000 COLONIES/mL PSEUDOMONAS AERUGINOSA    Coagulation Studies: No results for input(s): LABPROT, INR in the last 72 hours.  Urinalysis: No results for input(s): COLORURINE, LABSPEC, PHURINE, GLUCOSEU, HGBUR, BILIRUBINUR, KETONESUR, PROTEINUR, UROBILINOGEN, NITRITE, LEUKOCYTESUR in the last 72 hours.  Invalid input(s): APPERANCEUR    Imaging: No results found.   Medications:    . Chlorhexidine Gluconate Cloth  6 each Topical Q0600  . QUEtiapine  50 mg Oral QHS   acetaminophen **OR** acetaminophen, albuterol, antiseptic oral rinse, glycopyrrolate **OR** glycopyrrolate **OR** glycopyrrolate, haloperidol **OR** haloperidol **OR** haloperidol lactate, LORazepam **OR** LORazepam **OR** LORazepam, ondansetron **OR** ondansetron (ZOFRAN) IV, oxyCODONE, polyvinyl alcohol  Assessment/ Plan:  87 y.o. male with medical history of ESRD on HD MWF, hyperlipidemia, hypertension, diabetes mellitus type 2, hypothyroidism, ITP, history of MRSA bacteremia, anemia of chronic kidney disease, secondary hyperparathyroidism, generalized debility, history of Covid pneumonia  was admitted on 06/26/2020 for  Principal Problem:   Multiple falls Active Problems:   ESRD (end stage renal disease) on dialysis [facility])   Essential hypertension   Hypothyroidism   Recurrent UTI   Anemia in ESRD (end-stage renal disease) ([place])  Lower urinary tract infectious disease [N39.0] Multiple falls [R29.6] Fall, initial encounter [W19.[facility] dialysis/MWF/right IJ PermCath/[facility]  #.  Altered mental status, status post fall # ESRD No further dialysis to be performed which is quite reasonable given his clinical status.  Patient to go comfort care.   #. Anemia of CKD  Lab Results  Component Value Date   HGB 10.4 (L) 06/27/2020  No  further need for Epogen at this time.  #. Secondary hyperparathyroidism of renal origin N 25.81      Component Value Date/Time   PTH 132 (H) 04/12/2020 1740   Lab Results  Component Value Date   PHOS 2.3 (L) 05/24/2020  No need to draw further bone mineral metabolism labs.   #. Diabetes type 2 with CKD Hgb A1c MFr Bld (%)  Date Value  04/14/2020 6.9 (H)   No additional management.      LOS: 4 Munsoor Lateef 1/17/20223:20 PM  [place], [place]

## 2020-07-02 NOTE — Progress Notes (Addendum)
[facility]) Hospital Liaison RN note:  Received request from [employer], NP and Jhonnie Garner, [facility] for family interest in [facility]. Chart reviewed and eligibility has been approved. Spoke with spouse, Vaughan Basta to confirm interest and explain services. She verbalized understanding and had no questions.  Unfortunately, [facility] does not have a room to offer today. [place] Liaison will follow for room availability.   Please call with any hospice related questions or concerns.  Thank you for the opportunity to participate in this patient's care.  Loney Laurence [facility] Liaison [phone]

## 2020-07-03 DIAGNOSIS — Z515 Encounter for palliative care: Secondary | ICD-10-CM

## 2020-07-03 NOTE — Progress Notes (Signed)
PROGRESS NOTE    Alejandro Armstrong  [id number] DOB: Oct 30, 1929 DOA: 06/26/2020 PCP: Casilda Carls, MD   Brief Narrative: Taken from H&P [name] is a 87 y.o. male with medical history significant for dementia, ESRD on hemodialysis, hypertension, and hypothyroidism, presenting to the emergency department for evaluation of generalized weakness and recurrent falls.  History is primarily obtained from the patient's wife who reports that the patient has been increasingly weak in general, fell twice today which is unusual for him, and she is unable to care for him at their retirement community.  Patient had similar symptoms last month for which she was admitted to the hospital, diagnosed with UTI, discharged to SNF, and had improved.  He has now developed recurrent general weakness, fell this morning, was evaluated in the emergency department and not found to have any significant injury or obvious indication for hospital admission at that time, but went to see an outpatient physician where he fell again.   Imaging without any significant abnormality, labs with markedly elevated TSH and UA  was positive for pyuria and nitrites, urine cultures pending.  Palliative care met with family and decided to make him comfort care only, will be discharging to hospice home if the bed is available. Might be able to go tomorrow if bed becomes available.  Subjective: Patient with worsening lethargy, opens eyes just for 1 second on command, no other response.  Waiting for hospice home bed availability. No bed today.  Assessment & Plan:   Principal Problem:   Multiple falls Active Problems:   ESRD (end stage renal disease) on dialysis [facility])   Essential hypertension   Hypothyroidism   Recurrent UTI   Anemia in ESRD (end-stage renal disease) ([facility])  Generalized weakness/frequent falls.  UTI can be contributory.  Patient was recently sent home from rehab.  Family wants to put him back to rehab and  willing to pay privately as unable to take care of him at home. Talked with caregiver and wife and according to them they cannot take care of him at home as patient is refusing to go to dialysis and medications at home. -PT recommended 24/7 care with home health/SNF. -Family is trying to put him to [facility] with private pay, apparently [facility] saw and nursing note stating that he is needing one-to-one sitter which was wrong but they refused to take him until 48 hours without any intervention.  Patient has advanced dementia and sundowning is very common in this population group which can cause some agitation and pulling out lines requiring little bit more attention. -Continue Seroquel for sundowning at night. Palliative care meeting with family and they transitioned him to comfort measures only.  No more dialysis or labs. Consult was made for hospice home so he can be transferred once bed is available.  UTI.  UA with persistent pyuria and nitrites.  Recent urine culture with Pseudomonas so he was started on cefepime.  Remained afebrile and no leukocytosis.  Could not explain any urinary symptoms. - urine cultures with Pseudomonas aeruginosa-resistant to Cipro. Completed a 5-day course with cefepime.  Hypothyroidism.  TSH came back markedly elevated at 150, repeat 154.  It was within normal limit 2 months ago.  Talked with wife and caregiver and apparently he was not taking his thyroid medications properly.  T3 and T4 for levels including total and free all are decreased. -Increase the dose of Synthroid to 175 MCG. -He will need a repeat TSH in 4 weeks.  ESRD.  Family is struggling with him to go for dialysis regularly, last dialysis was on Monday.  No acute indication for urgent dialysis.  Patient normally gets dialysis Monday, Wednesday and Friday. -Nephrology was consulted. Stop dialysis as patient is now comfort care only.  Hypertension.  Blood pressure mildly  elevated. -Continue with lisinopril, Lopressor and hydralazine.  Bradycardia.  Resolved . Holding all medications except comfort measure.  Dementia. -Continue with supportive care and delirium precautions.  Anemia of chronic disease.  Hemoglobin stable. -Continue to monitor.  Objective: Vitals:   07/02/20 1200 07/02/20 1529 07/03/20 0117 07/03/20 0756  BP: (!) 124/104 (!) 166/97 (!) 154/96 (!) 187/99  Pulse: 90 67 68 63  Resp:  '13 16 18  ' Temp:  98.9 F (37.2 C) 98.4 F (36.9 C) 98.1 F (36.7 C)  TempSrc:  Oral    SpO2:  100% 100% 100%  Weight:      Height:        Intake/Output Summary (Last 24 hours) at 07/03/2020 1513 Last data filed at 07/03/2020 1410 Gross per 24 hour  Intake 480 ml  Output --  Net 480 ml   Filed Weights   06/27/20 0329  Weight: 70.1 kg    Examination:  General.  Very lethargic and frail elderly man, in no acute distress. Pulmonary.  Lungs clear bilaterally, normal respiratory effort. CV.  Regular rate and rhythm, no JVD, rub or murmur. Abdomen.  Soft, nontender, nondistended, BS positive. CNS.  Alert and oriented x3.  No focal neurologic deficit. Extremities.  No edema, no cyanosis, pulses intact and symmetrical. Psychiatry.  Judgment and insight appears impaired.  DVT prophylaxis: Heparin Code Status: DNR Family Communication: Talked with wife  on phone Disposition Plan:  Status is: Observation  Dispo: The patient is from: Home              Anticipated d/c is to: SNF              Anticipated d/c date is: 1 days               Patient is being transitioned to comfort care only.  Waiting for hospice home bed availability.  Consultants:   Nephrology  Procedures:  Antimicrobials:   Data Reviewed: I have personally reviewed following labs and imaging studies  CBC: Recent Labs  Lab 06/26/20 1544 06/27/20 0330  WBC 7.7 5.3  NEUTROABS 5.3  --   HGB 10.8* 10.4*  HCT 32.5* 32.0*  MCV 99.7 100.0  PLT 190 151   Basic Metabolic  Panel: Recent Labs  Lab 06/26/20 1544 06/27/20 0330 06/28/20 0157  NA 134* 139 138  K 3.9 4.1 4.1  CL 95* 101 102  CO2 '25 26 22  ' GLUCOSE 89 76 91  BUN 23 27* 36*  CREATININE 5.90* 6.57* 7.69*  CALCIUM 9.6 9.3 10.1   GFR: Estimated Creatinine Clearance: 6.2 mL/min (A) (by C-G formula based on SCr of 7.69 mg/dL (H)). Liver Function Tests: No results for input(s): AST, ALT, ALKPHOS, BILITOT, PROT, ALBUMIN in the last 168 hours. No results for input(s): LIPASE, AMYLASE in the last 168 hours. No results for input(s): AMMONIA in the last 168 hours. Coagulation Profile: No results for input(s): INR, PROTIME in the last 168 hours. Cardiac Enzymes: Recent Labs  Lab 06/27/20 0330  CKTOTAL 132   BNP (last 3 results) No results for input(s): PROBNP in the last 8760 hours. HbA1C: No results for input(s): HGBA1C in the last 72 hours. CBG: No results for input(s): GLUCAP  in the last 168 hours. Lipid Profile: No results for input(s): CHOL, HDL, LDLCALC, TRIG, CHOLHDL, LDLDIRECT in the last 72 hours. Thyroid Function Tests: No results for input(s): TSH, T4TOTAL, FREET4, T3FREE, THYROIDAB in the last 72 hours. Anemia Panel: No results for input(s): VITAMINB12, FOLATE, FERRITIN, TIBC, IRON, RETICCTPCT in the last 72 hours. Sepsis Labs: No results for input(s): PROCALCITON, LATICACIDVEN in the last 168 hours.  Recent Results (from the past 240 hour(s))  SARS CORONAVIRUS 2 (TAT 6-24 HRS) Nasopharyngeal Nasopharyngeal Swab     Status: None   Collection Time: 06/26/20  4:53 PM   Specimen: Nasopharyngeal Swab  Result Value Ref Range Status   SARS Coronavirus 2 NEGATIVE NEGATIVE Final    Comment: (NOTE) SARS-CoV-2 target nucleic acids are NOT DETECTED.  The SARS-CoV-2 RNA is generally detectable in upper and lower respiratory specimens during the acute phase of infection. Negative results do not preclude SARS-CoV-2 infection, do not rule out co-infections with other pathogens, and  should not be used as the sole basis for treatment or other patient management decisions. Negative results must be combined with clinical observations, patient history, and epidemiological information. The expected result is Negative.  Fact Sheet for Patients: SugarRoll.be  Fact Sheet for Healthcare Providers: https://www.woods-mathews.com/  This test is not yet approved or cleared by the [place] FDA and  has been authorized for detection and/or diagnosis of SARS-CoV-2 by FDA under an Emergency Use Authorization (EUA). This EUA will remain  in effect (meaning this test can be used) for the duration of the COVID-19 declaration under Se ction 564(b)(1) of the Act, 21 U.S.C. section 360bbb-3(b)(1), unless the authorization is terminated or revoked sooner.  Performed at [facility], [place] [address], [place], [place] [address]   Urine culture     Status: Abnormal   Collection Time: 06/26/20  4:53 PM   Specimen: Urine, Random  Result Value Ref Range Status   Specimen Description   Final    URINE, RANDOM Performed at [facility], [address] [place], [place] [address]    Special Requests   Final    NONE Performed at [facility], [address] [place],  [address]    Culture >=100,000 COLONIES/mL PSEUDOMONAS AERUGINOSA (A)  Final   Report Status 06/29/2020 FINAL  Final   Organism ID, Bacteria PSEUDOMONAS AERUGINOSA (A)  Final      Susceptibility   Pseudomonas aeruginosa - MIC*    CEFTAZIDIME 4 SENSITIVE Sensitive     CIPROFLOXACIN >=4 RESISTANT Resistant     GENTAMICIN >=16 RESISTANT Resistant     IMIPENEM 1 SENSITIVE Sensitive     PIP/TAZO 16 SENSITIVE Sensitive     CEFEPIME 8 SENSITIVE Sensitive     * >=100,000 COLONIES/mL PSEUDOMONAS AERUGINOSA     Radiology Studies: No results found.  Scheduled Meds: . Chlorhexidine Gluconate Cloth  6 each Topical Q0600  . QUEtiapine  50 mg Oral QHS    Continuous Infusions:    LOS: 5 days   Time spent: 30 minutes.  Lorella Nimrod, MD [facility]  If 7PM-7AM, please contact night-coverage [email or web address]  07/03/2020, 3:13 PM   This record has been created using Systems analyst. Errors have been sought and corrected,but may not always be located. Such creation errors do not reflect on the standard of care.

## 2020-07-03 NOTE — Progress Notes (Signed)
[facility]) Hospital Liaison RN note:  Visited patient in room. Resting quietly. Spoke with sister, Janalyn Shy. Spouse Izora Gala gave verbal permission to speak with Vaughan Basta and to have [place] sign consents.   Unfortunately, [facility] does not have a bed to offer today but plan is to transfer tomorrow if possible. Janalyn Shy will sign consents this afternoon. Hospital care team is aware. [place] Liaison will follow and update in the morning.  Please call with any hospice related questions or concerns.  Thank you for the opportunity to participate in this patient care.  Loney Laurence [facility] Liaison [phone]

## 2020-07-03 NOTE — Progress Notes (Addendum)
Daily Progress Note   Patient Name: Alejandro Armstrong       Date: 07/03/2020 DOB: [birth year]  Age: 87 y.o. MRN#: [id number] Attending Physician: Lorella Nimrod, MD Primary Care Physician: Casilda Carls, MD Admit Date: 06/26/2020  Reason for Consultation/Follow-up: Terminal Care  Subjective: Patient is resting in bed. Even and unlabored respirations. He does not respond to verbal stimuli or touch. No distress noted; no change to symptom manamgement. No family at bedside.   Length of Stay: 5  Current Medications: Scheduled Meds:  . Chlorhexidine Gluconate Cloth  6 each Topical Q0600  . QUEtiapine  50 mg Oral QHS    Continuous Infusions:   PRN Meds: acetaminophen **OR** acetaminophen, albuterol, antiseptic oral rinse, glycopyrrolate **OR** glycopyrrolate **OR** glycopyrrolate, haloperidol **OR** haloperidol **OR** haloperidol lactate, LORazepam **OR** LORazepam **OR** LORazepam, ondansetron **OR** ondansetron (ZOFRAN) IV, oxyCODONE, polyvinyl alcohol  Physical Exam Constitutional:      Comments: Eyes closed.   Pulmonary:     Effort: Pulmonary effort is normal.             Vital Signs: BP (!) 187/99 (BP Location: Right Arm)   Pulse 63   Temp 98.1 F (36.7 C)   Resp 18   Ht '5\' 7"'$  (1.702 m)   Wt 70.1 kg   SpO2 100%   BMI 24.20 kg/m  SpO2: SpO2: 100 % O2 Device: O2 Device: Room Air O2 Flow Rate:    Intake/output summary:   Intake/Output Summary (Last 24 hours) at 07/03/2020 1008 Last data filed at 07/02/2020 1900 Gross per 24 hour  Intake 0 ml  Output -335 ml  Net 335 ml   LBM: Last BM Date: 06/30/20 Baseline Weight: Weight: 70.1 kg Most recent weight: Weight: 70.1 kg     Patient Active Problem List   Diagnosis Date Noted  . Multiple falls 06/26/2020  .  Volume overload 05/22/2020  . 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 Leukocytosis 04/24/2020  . Anemia in ESRD (end-stage renal disease) ([place]) 04/24/2020  . Acute hypoxemic respiratory failure ([place]) 04/18/2020  . Pneumonia due to COVID-19 virus   . Generalized weakness   . Recurrent UTI 02/16/2020  . Complication of vascular access for dialysis 11/20/2017  . Hyperlipidemia 03/26/2017  . ESRD (end stage renal disease) on dialysis ([place]) 03/26/2017  . Postop check 04/30/2016  .  Fever chills 09/18/2015  . Acute on chronic renal insufficiency 09/01/2015  . Hyperkalemia 09/01/2015  . MRSA bacteremia secondary to PICC line infection 09/01/2015  . Type II diabetes mellitus with renal manifestations ([place]) 09/01/2015  . UTI (lower urinary tract infection) 08/06/2015  . Idiopathic thrombocytopenic purpura ([place]) 08/02/2015  . B12 deficiency 07/24/2015  . Pseudomonas infection 04/28/2014  . 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 Urinary urgency 02/21/2013  . Hypertrophy of prostate with urinary obstruction and other lower urinary tract symptoms (LUTS) 11/18/2012  . Calculus of kidney 03/12/2012  . Increased frequency of urination 03/12/2012  . Nocturia 03/12/2012  . 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 Urethral stricture 03/12/2012  . Urinary tract infection 03/12/2012  . Essential hypertension 03/31/2011  . Hypothyroidism 03/31/2011  . Chronic kidney disease, stage IV (severe) ([place]) 03/31/2011    Palliative Care Assessment & Plan    Recommendations/Plan:  Comfort care. Waiting for hospice facility bed.   Code Status:    Code Status Orders  (From admission, onward)         Start     Ordered   07/02/20 1247  Do not attempt resuscitation (DNR)  Continuous       Question Answer Comment  In the event of cardiac or respiratory ARREST Do not call a "code blue"   In the event of cardiac or respiratory ARREST Do not perform Intubation, CPR, defibrillation or ACLS   In  the event of cardiac or respiratory ARREST Use medication by any route, position, wound care, and other measures to relive pain and suffering. May use oxygen, suction and manual treatment of airway obstruction as needed for comfort.   Comments MOST form in [place]      07/02/20 1247        Code Status History    Date Active Date Inactive Code Status Order ID Comments User Context   06/26/2020 2142 07/02/2020 1247 DNR [id number]  Vianne Bulls, MD ED   05/22/2020 1338 05/24/2020 2246 DNR [id number]  Cox, Amy N, DO ED   05/22/2020 1246 05/22/2020 1337 Full Code [id number]  Cox, Amy N, DO ED   04/24/2020 1916 04/26/2020 0401 DNR [id number]  Ivor Costa, MD ED   04/12/2020 1619 04/21/2020 0218 DNR [id number]  Loletha Grayer, MD ED   04/12/2020 1614 04/12/2020 1619 DNR [id number]  Loletha Grayer, MD ED   11/04/2019 2017 11/05/2019 1700 Full Code [id number]  Mansy, Arvella Merles, MD ED   09/16/2018 0957 09/16/2018 1429 Full Code [id number]  Hessie Knows, MD Inpatient   09/18/2015 0622 09/21/2015 1800 Full Code [id number]  Saundra Shelling, MD Inpatient   09/01/2015 2355 09/06/2015 1835 Partial Code [id number]  Sylvan Cheese, MD Inpatient   08/06/2015 0141 08/08/2015 1433 Full Code [id number]  Hillary Bow, MD ED   08/03/2015 1423 08/04/2015 1352 Full Code [id number]  [name], MD Inpatient   07/24/2015 1235 07/29/2015 1712 Full Code [id number]  Hower, Aaron Mose, MD Inpatient   Advance Care Planning Activity       Prognosis:  < 2 weeks  ESRD, stopped dialysis. Minimal PO intake.     Thank you for allowing the Palliative Medicine Team to assist in the care of this patient.   Total Time 15 min Prolonged Time Billed  no       Greater than 50%  of this time was spent counseling and coordinating care related to the above assessment and plan.  Asencion Gowda, NP  Please contact Palliative Medicine Team phone at  [phone] for questions and concerns.

## 2020-07-04 NOTE — Discharge Summary (Signed)
[facility] Discharge Summary   Patient: Alejandro Armstrong VZD:[id number]  PCP: Alejandro Carls, MD  Date of admission: 06/26/2020   Date of discharge:  07/04/2020     Discharge Diagnoses:  Principal Problem:   Multiple falls Active Problems:   ESRD (end stage renal disease) on dialysis [facility])   Essential hypertension   Hypothyroidism   Recurrent UTI   Anemia in ESRD (end-stage renal disease) ([place])   Admitted From: Home Disposition: Hospice facility  Recommendations for Outpatient Follow-up:  1. Follow hospice care   Diet recommendation: Renal diet  Activity: The patient is advised to gradually reintroduce usual activities, as tolerated  Discharge Condition: stable  Code Status: DNR   History of present illness: As per the H and P dictated on admission  Hospital Course:  Alejandro Whilden Whitfieldis a 87 y.o.malewith medical history significant fordementia, ESRD on hemodialysis, hypertension, and hypothyroidism, presenting to the emergency department for evaluation of generalized weakness and recurrent falls. History is primarily obtained from the patient's wife who reports that the patient has been increasingly weak in general, fell twice today which is unusual for him, and she is unable to care for him at their retirement community. Patient had similar symptoms last month for which she was admitted to the hospital, diagnosed with UTI, discharged to SNF, and had improved. He has now developed recurrent general weakness, fell this morning, was evaluated in the emergency department and not found to have any significant injury or obvious indication for hospital admission at that time, but went to see an outpatient physician where he fell again.  Imaging without any significant abnormality, labs with markedly elevated TSH and UA was positive for pyuria and nitrites, urine cultures > 100K Pseudomonas aeruginosa.   Palliative care met with family and decided to make him comfort  care only, discharging to hospice home today.    Assessment & Plan:   Principal Problem:   Multiple falls Active Problems:   ESRD (end stage renal disease) on dialysis [facility])   Essential hypertension   Hypothyroidism   Recurrent UTI   Anemia in ESRD (end-stage renal disease) ([facility])  Generalized weakness/frequent falls.  UTI can be contributory.  Patient was recently sent home from rehab.  Family wants to put him back to rehab and willing to pay privately as unable to take care of him at home. Talked with caregiver and wife and according to them they cannot take care of him at home as patient is refusing to go to dialysis and medications at home. -PT recommended 24/7 care with home health/SNF. -Family was trying to put him to [facility] with private pay, apparently [facility] saw and nursing note stating that he is needing one-to-one sitter which was wrong but they refused to take him until 48 hours without any intervention.  Patient has advanced dementia and sundowning is very common in this population group which can cause some agitation and pulling out lines requiring little bit more attention. -s/p Seroquel for sundowning at night. Palliative care meeting with family and they transitioned him to comfort measures only.  No more dialysis or labs. Consult was made for hospice home and patient got bed offer today so patient is being discharged.    UTI.  UA with persistent pyuria and nitrites.  Recent urine culture with Pseudomonas so he was started on cefepime.  Remained afebrile and no leukocytosis.  Could not explain any urinary symptoms. urine cultures with Pseudomonas aeruginosa-resistant to Cipro. Completed a 5-day course with cefepime.  Hypothyroidism.  TSH came back markedly elevated at 150, repeat 154.  It was within normal limit 2 months ago.  Talked with wife and caregiver and apparently he was not taking his thyroid medications properly. T3 and T4 for levels including  total and free all are decreased. Increase the dose of Synthroid to 175 MCG.   ESRD.  Family is struggling with him to go for dialysis regularly, last dialysis was on Monday.  No acute indication for urgent dialysis.  Patient normally gets dialysis Monday, Wednesday and Friday. -Nephrology was consulted.  Stop dialysis as patient is now comfort care only.  Hypertension.  Blood pressure mildly elevated. -Continue with lisinopril, Lopressor and hydralazine.  Bradycardia.  Resolved . May hold all medications at hospice care Dementia. Continue with supportive care and delirium precautions. Anemia of chronic disease.  Hemoglobin stable. Body mass index is 24.2 kg/m.       Consultants: Nephrology, palliative care Procedures: Hemodialysis  Discharge Exam: General: Appear in no distress, no Rash;  Cardiovascular: S1 and S2 Present, no Murmur, Respiratory: normal respiratory effort, Bilateral Air entry present and no Crackles, no wheezes Abdomen: Bowel Sound present, Soft and no tenderness, no hernia Extremities: no Pedal edema, no calf tenderness Neurology: lethargic and not oriented to time, place, and person affect flat in affect.  Filed Weights   06/27/20 0329  Weight: 70.1 kg   Vitals:   07/03/20 0756 07/04/20 0829  BP: (!) 187/99 (!) 146/117  Pulse: 63 84  Resp: 18 16  Temp: 98.1 F (36.7 C) 98.8 F (37.1 C)  SpO2: 100% 97%    DISCHARGE MEDICATION: Allergies as of 07/04/2020   No Known Allergies     Medication List    TAKE these medications   acetaminophen 500 MG tablet Commonly known as: TYLENOL Take 1,000 mg by mouth every 6 (six) hours as needed for moderate pain or headache.   albuterol 108 (90 Base) MCG/ACT inhaler Commonly known as: VENTOLIN HFA Inhale 2 puffs into the lungs every 6 (six) hours.   atorvastatin 10 MG tablet Commonly known as: LIPITOR Take 10 mg by mouth at bedtime.   calcium acetate 667 MG capsule Commonly known as: PHOSLO Take 2  capsules (1,334 mg total) by mouth 3 (three) times daily with meals.   Carboxymethylcellulose Sodium 0.25 % Soln Place 1-2 drops into both eyes 3 (three) times daily as needed (for dry eyes).   hydrALAZINE 25 MG tablet Commonly known as: APRESOLINE Take 25 mg by mouth 2 (two) times daily.   levothyroxine 125 MCG tablet Commonly known as: SYNTHROID Take 125 mcg by mouth daily before breakfast.   lidocaine-prilocaine cream Commonly known as: EMLA Apply 1 application topically daily as needed (port access).   lisinopril 20 MG tablet Commonly known as: ZESTRIL Take 20 mg by mouth daily.   metoprolol tartrate 25 MG tablet Commonly known as: LOPRESSOR Take 0.5 tablets (12.5 mg total) by mouth 2 (two) times daily. What changed: how much to take   Omega-3 1000 MG Caps Take 2,000 mg by mouth 2 (two) times daily.   Rena-Vite Rx 1 MG Tabs Take 1 tablet by mouth daily.   Vitamin D3 1.25 MG (50000 UT) Caps Take 50,000 Units by mouth every Wednesday.      No Known Allergies Discharge Instructions    Diet - low sodium heart healthy   Complete by: As directed    Discharge instructions   Complete by: As directed    Follow hospice care  Increase activity slowly   Complete by: As directed       The results of significant diagnostics from this hospitalization (including imaging, microbiology, ancillary and laboratory) are listed below for reference.    Significant Diagnostic Studies: DG Chest 2 View  Result Date: 06/26/2020 CLINICAL DATA:  Fall. EXAM: CHEST - 2 VIEW COMPARISON:  Chest x-ray 05/22/2020. FINDINGS: Dual-lumen catheter stable position. Heart size stable. Low lung volumes. No focal infiltrate. No pleural effusion or pneumothorax. Left subclavian and upper extremity stents in stable position. IMPRESSION: 1. Dual-lumen catheter in stable position. 2. Low lung volumes with mild bibasilar atelectasis. No acute cardiopulmonary disease. Electronically Signed   By: Marcello Moores   Register   On: 06/26/2020 09:57   CT Head Wo Contrast  Result Date: 06/26/2020 CLINICAL DATA:  Fall EXAM: CT HEAD WITHOUT CONTRAST CT CERVICAL SPINE WITHOUT CONTRAST TECHNIQUE: Multidetector CT imaging of the head and cervical spine was performed following the standard protocol without intravenous contrast. Multiplanar CT image reconstructions of the cervical spine were also generated. COMPARISON:  CT brain and cervical spine 06/26/2020, 05/22/2020 FINDINGS: CT HEAD FINDINGS Brain: No acute territorial infarction, hemorrhage, or intracranial mass. Moderate atrophy. Moderate hypodensity in the white matter consistent with chronic small vessel ischemic change. Stable ventricle size. Vascular: No hyperdense vessels.  Carotid vascular calcification Skull: Normal. Negative for fracture or focal lesion. Sinuses/Orbits: Mild frothy debris in the right ethmoid sinus. Other: None CT CERVICAL SPINE FINDINGS Alignment: No subluxation.  Facet alignment within normal limits. Skull base and vertebrae: No acute fracture. No primary bone lesion or focal pathologic process. Soft tissues and spinal canal: No prevertebral fluid or swelling. No visible canal hematoma. Disc levels: Mild multiple level degenerative change. Mild facet degenerative changes at multiple levels. Upper chest: Negative. Other: Partially visualized left subclavian stent. IMPRESSION: 1. No CT evidence for acute intracranial abnormality. Atrophy and chronic small vessel ischemic change of the white matter. 2. Degenerative changes of the cervical spine. No acute osseous abnormality. Electronically Signed   By: Donavan Foil M.D.   On: 06/26/2020 17:32   CT Head Wo Contrast  Result Date: 06/26/2020 CLINICAL DATA:  87 year old male status post fall, found down. EXAM: CT HEAD WITHOUT CONTRAST TECHNIQUE: Contiguous axial images were obtained from the base of the skull through the vertex without intravenous contrast. COMPARISON:  Head CT 05/22/2020 and earlier.  FINDINGS: Brain: Cerebral volume is stable from last month. Stable gray-white matter differentiation throughout the brain. Patchy and confluent bilateral white matter hypodensity. No midline shift, ventriculomegaly, mass effect, evidence of mass lesion, intracranial hemorrhage or evidence of cortically based acute infarction. Vascular: Calcified atherosclerosis at the skull base. No suspicious intracranial vascular hyperdensity. Skull: No acute osseous abnormality identified. Sinuses/Orbits: Trace bubbly opacity posterior right ethmoid air cell, otherwise Visualized paranasal sinuses and mastoids are clear. Other: No orbit or scalp soft tissue injury identified. Scalp vessel calcified atherosclerosis. IMPRESSION: Stable head CT. No acute intracranial abnormality or acute traumatic injury identified. Electronically Signed   By: Genevie Ann M.D.   On: 06/26/2020 10:29   CT Cervical Spine Wo Contrast  Result Date: 06/26/2020 CLINICAL DATA:  Fall EXAM: CT HEAD WITHOUT CONTRAST CT CERVICAL SPINE WITHOUT CONTRAST TECHNIQUE: Multidetector CT imaging of the head and cervical spine was performed following the standard protocol without intravenous contrast. Multiplanar CT image reconstructions of the cervical spine were also generated. COMPARISON:  CT brain and cervical spine 06/26/2020, 05/22/2020 FINDINGS: CT HEAD FINDINGS Brain: No acute territorial infarction, hemorrhage, or intracranial mass. Moderate  atrophy. Moderate hypodensity in the white matter consistent with chronic small vessel ischemic change. Stable ventricle size. Vascular: No hyperdense vessels.  Carotid vascular calcification Skull: Normal. Negative for fracture or focal lesion. Sinuses/Orbits: Mild frothy debris in the right ethmoid sinus. Other: None CT CERVICAL SPINE FINDINGS Alignment: No subluxation.  Facet alignment within normal limits. Skull base and vertebrae: No acute fracture. No primary bone lesion or focal pathologic process. Soft tissues and  spinal canal: No prevertebral fluid or swelling. No visible canal hematoma. Disc levels: Mild multiple level degenerative change. Mild facet degenerative changes at multiple levels. Upper chest: Negative. Other: Partially visualized left subclavian stent. IMPRESSION: 1. No CT evidence for acute intracranial abnormality. Atrophy and chronic small vessel ischemic change of the white matter. 2. Degenerative changes of the cervical spine. No acute osseous abnormality. Electronically Signed   By: Donavan Foil M.D.   On: 06/26/2020 17:32   CT Cervical Spine Wo Contrast  Result Date: 06/26/2020 CLINICAL DATA:  87 year old male status post fall, found down. EXAM: CT CERVICAL SPINE WITHOUT CONTRAST TECHNIQUE: Multidetector CT imaging of the cervical spine was performed without intravenous contrast. Multiplanar CT image reconstructions were also generated. COMPARISON:  None. FINDINGS: Alignment: Preserved cervical lordosis. Cervicothoracic junction alignment is within normal limits. Bilateral posterior element alignment is within normal limits. Skull base and vertebrae: Osteopenia. Visualized skull base is intact. No atlanto-occipital dissociation. C1 and C2 appear intact and normally aligned. Degenerative subchondral cysts at the base of the odontoid. No acute osseous abnormality identified. Soft tissues and spinal canal: No prevertebral fluid or swelling. No visible canal hematoma. Right lower neck dual lumen central line partially visible. Mild cervical carotid calcified atherosclerosis. Disc levels:  Mild for age cervical spine degeneration. Upper chest: Osteopenia. Grossly intact visible upper thoracic levels. Negative lung apices. Partially visible left subclavian vascular stent. IMPRESSION: No acute traumatic injury identified in the cervical spine. Electronically Signed   By: Genevie Ann M.D.   On: 06/26/2020 10:34   DG Pelvis Portable  Result Date: 06/26/2020 CLINICAL DATA:  Pain following fall EXAM: PORTABLE  PELVIS 1-2 VIEWS COMPARISON:  April 24, 2015 FINDINGS: There is no evidence of pelvic fracture or dislocation. There is mild symmetric narrowing of each hip joint. No erosive change. Evidence of previous kyphoplasty at L3. IMPRESSION: No acute fracture or dislocation. Mild symmetric narrowing each hip joint. Previous kyphoplasty at L3 noted. Electronically Signed   By: Lowella Grip III M.D.   On: 06/26/2020 11:11   PERIPHERAL VASCULAR CATHETERIZATION  Result Date: 06/05/2020 See Op Note  DG Shoulder Left  Result Date: 06/26/2020 CLINICAL DATA:  Fall EXAM: LEFT SHOULDER - 2+ VIEW COMPARISON:  None. FINDINGS: Normal alignment with approximation of the joints. No fracture or focal osseous lesion. Osteopenia. Mild acromioclavicular osteoarthrosis. Soft tissues are within normal limits. Upper extremity vascular stent grafts. IMPRESSION: No acute osseous abnormality. Mild acromioclavicular osteoarthrosis. Electronically Signed   By: Primitivo Gauze M.D.   On: 06/26/2020 09:59    Microbiology: Recent Results (from the past 240 hour(s))  SARS CORONAVIRUS 2 (TAT 6-24 HRS) Nasopharyngeal Nasopharyngeal Swab     Status: None   Collection Time: 06/26/20  4:53 PM   Specimen: Nasopharyngeal Swab  Result Value Ref Range Status   SARS Coronavirus 2 NEGATIVE NEGATIVE Final    Comment: (NOTE) SARS-CoV-2 target nucleic acids are NOT DETECTED.  The SARS-CoV-2 RNA is generally detectable in upper and lower respiratory specimens during the acute phase of infection. Negative results do not preclude SARS-CoV-2  infection, do not rule out co-infections with other pathogens, and should not be used as the sole basis for treatment or other patient management decisions. Negative results must be combined with clinical observations, patient history, and epidemiological information. The expected result is Negative.  Fact Sheet for Patients: SugarRoll.be  Fact Sheet for  Healthcare Providers: https://www.woods-mathews.com/  This test is not yet approved or cleared by the [place] FDA and  has been authorized for detection and/or diagnosis of SARS-CoV-2 by FDA under an Emergency Use Authorization (EUA). This EUA will remain  in effect (meaning this test can be used) for the duration of the COVID-19 declaration under Se ction 564(b)(1) of the Act, 21 U.S.C. section 360bbb-3(b)(1), unless the authorization is terminated or revoked sooner.  Performed at [facility], [place] [address], [place], [place] [address]   Urine culture     Status: Abnormal   Collection Time: 06/26/20  4:53 PM   Specimen: Urine, Random  Result Value Ref Range Status   Specimen Description   Final    URINE, RANDOM Performed at [facility], [address], [place], [place] [address]    Special Requests   Final    NONE Performed at [facility], [address] [place], [place] [address]    Culture >=100,000 COLONIES/mL PSEUDOMONAS AERUGINOSA (A)  Final   Report Status 06/29/2020 FINAL  Final   Organism ID, Bacteria PSEUDOMONAS AERUGINOSA (A)  Final      Susceptibility   Pseudomonas aeruginosa - MIC*    CEFTAZIDIME 4 SENSITIVE Sensitive     CIPROFLOXACIN >=4 RESISTANT Resistant     GENTAMICIN >=16 RESISTANT Resistant     IMIPENEM 1 SENSITIVE Sensitive     PIP/TAZO 16 SENSITIVE Sensitive     CEFEPIME 8 SENSITIVE Sensitive     * >=100,000 COLONIES/mL PSEUDOMONAS AERUGINOSA     Labs: CBC: No results for input(s): WBC, NEUTROABS, HGB, HCT, MCV, PLT in the last 168 hours. Basic Metabolic Panel: Recent Labs  Lab 06/28/20 0157  NA 138  K 4.1  CL 102  CO2 22  GLUCOSE 91  BUN 36*  CREATININE 7.69*  CALCIUM 10.1   Liver Function Tests: No results for input(s): AST, ALT, ALKPHOS, BILITOT, PROT, ALBUMIN in the last 168 hours. No results for input(s): LIPASE, AMYLASE in the last 168 hours. No results for input(s): AMMONIA in the  last 168 hours. Cardiac Enzymes: No results for input(s): CKTOTAL, CKMB, CKMBINDEX, TROPONINI in the last 168 hours. BNP (last 3 results) No results for input(s): BNP in the last 8760 hours. CBG: No results for input(s): GLUCAP in the last 168 hours.  Time spent: 35 minutes  Signed:  Val Riles  [facility]  07/04/2020 11:52 AM

## 2020-07-04 NOTE — Care Management Important Message (Signed)
Important Message  Patient Details  Name: Alejandro Armstrong MRN: [id number] Date of Birth: Jan 09, 1930   Medicare Important Message Given:  Other (see comment)  Patient is on comfort care and awaiting Hospice Home bed.  Out of respect for the patient and family no Important Message from [facility] given.   Juliann Pulse A Leroy Trim 07/04/2020, 8:48 AM

## 2020-07-04 NOTE — Progress Notes (Addendum)
[facility]) Hospital Liaison RN note:  [place] is able to offer a bed today. Spoke with spouse, Izora Gala via telephone to provide update. Consents were completed yesterday by sister, Vaughan Basta. Transportation has been arranged for 12:45pm.   Please send signed and completed DNR with patient. RN please call report to the [facility] at [phone]. I will fax the discharge summary once available.  Thank you for the opportunity to participate in this patient's care.  Loney [facility] Liaison [phone]

## 2020-07-17 DEATH — deceased

## 2020-08-16 ENCOUNTER — Ambulatory Visit (INDEPENDENT_AMBULATORY_CARE_PROVIDER_SITE_OTHER): Payer: Medicare HMO | Admitting: Vascular Surgery

## 2020-08-16 ENCOUNTER — Encounter (INDEPENDENT_AMBULATORY_CARE_PROVIDER_SITE_OTHER): Payer: Medicare HMO

## 2020-08-27 ENCOUNTER — Other Ambulatory Visit: Payer: Medicare HMO

## 2020-08-27 ENCOUNTER — Ambulatory Visit: Payer: Medicare HMO | Admitting: Oncology

## 2020-12-08 IMAGING — CR DG CHEST 2V
2 series · 2 of 2 positions shown · non-contrast
Comparison: 05/12/2020

CLINICAL DATA: Weakness

EXAM:
CHEST - 2 VIEW

[chest lat]
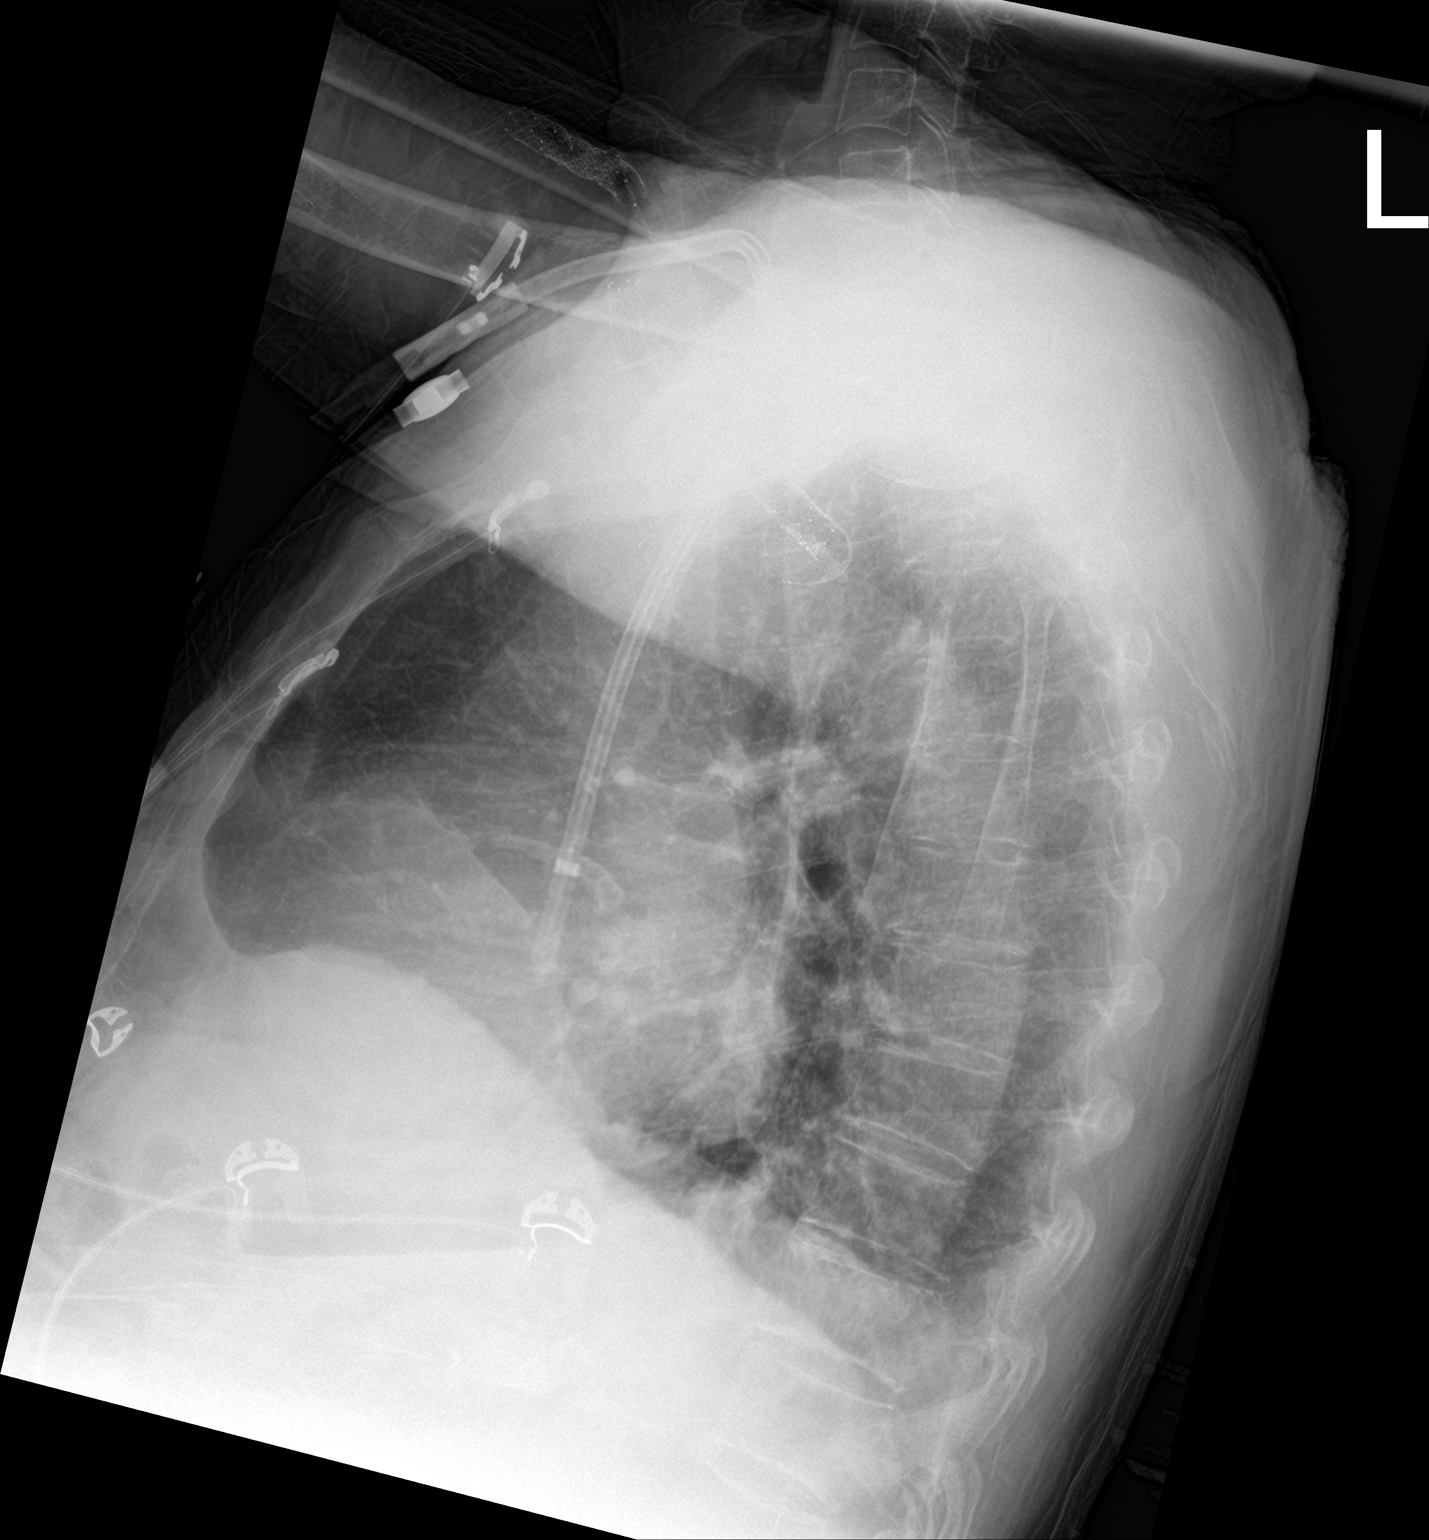

[chest ap]
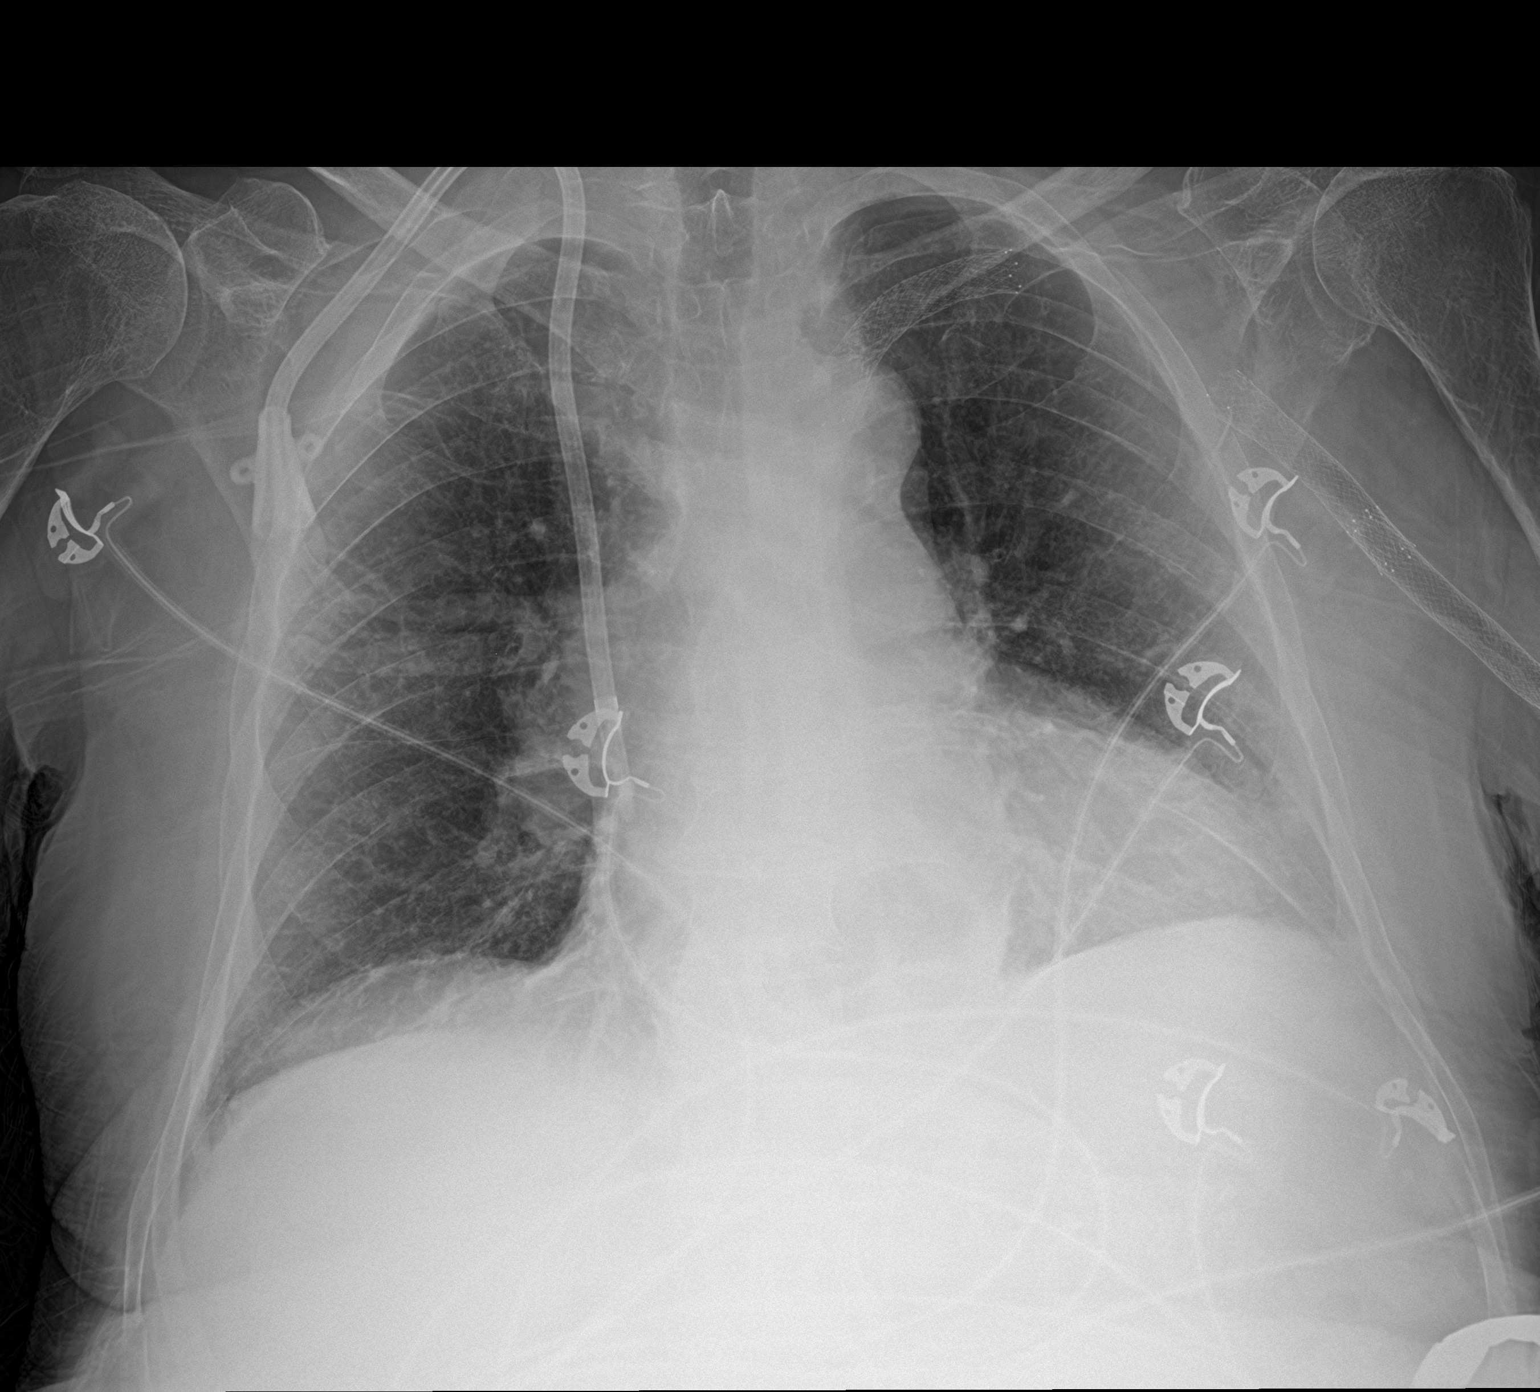

[2 of 2 positions shown; findings below may reference images not displayed]

FINDINGS: Dialysis catheter on the right with tip at the upper cavoatrial
junction. Chronic cardiomegaly. Moderate hiatal hernia. Borderline
hyperinflation with diaphragm flattening. Extensive venous stenting
on the left, likely dialysis related. There is no edema,
consolidation, effusion, or pneumothorax.
IMPRESSION: Stable exam.  No evidence of acute disease.
